# Patient Record
Sex: Female | Born: 1955 | Race: Black or African American | Hispanic: No | Smoking: Never smoker
Health system: Southern US, Community
[De-identification: ages and names within clinical notes are randomized; demographics above are authoritative.]

## PROBLEM LIST (undated history)

## (undated) DIAGNOSIS — F329 Major depressive disorder, single episode, unspecified: Secondary | ICD-10-CM

## (undated) DIAGNOSIS — I2510 Atherosclerotic heart disease of native coronary artery without angina pectoris: Secondary | ICD-10-CM

## (undated) DIAGNOSIS — K219 Gastro-esophageal reflux disease without esophagitis: Secondary | ICD-10-CM

## (undated) DIAGNOSIS — C50919 Malignant neoplasm of unspecified site of unspecified female breast: Secondary | ICD-10-CM

## (undated) DIAGNOSIS — E7800 Pure hypercholesterolemia, unspecified: Secondary | ICD-10-CM

## (undated) DIAGNOSIS — C801 Malignant (primary) neoplasm, unspecified: Secondary | ICD-10-CM

## (undated) DIAGNOSIS — R011 Cardiac murmur, unspecified: Secondary | ICD-10-CM

## (undated) DIAGNOSIS — F32A Depression, unspecified: Secondary | ICD-10-CM

## (undated) DIAGNOSIS — G4730 Sleep apnea, unspecified: Secondary | ICD-10-CM

## (undated) DIAGNOSIS — L509 Urticaria, unspecified: Secondary | ICD-10-CM

## (undated) DIAGNOSIS — E119 Type 2 diabetes mellitus without complications: Secondary | ICD-10-CM

## (undated) DIAGNOSIS — Z9289 Personal history of other medical treatment: Secondary | ICD-10-CM

## (undated) DIAGNOSIS — S36113A Laceration of liver, unspecified degree, initial encounter: Secondary | ICD-10-CM

## (undated) DIAGNOSIS — J811 Chronic pulmonary edema: Secondary | ICD-10-CM

## (undated) DIAGNOSIS — Z8739 Personal history of other diseases of the musculoskeletal system and connective tissue: Secondary | ICD-10-CM

## (undated) DIAGNOSIS — I10 Essential (primary) hypertension: Secondary | ICD-10-CM

## (undated) DIAGNOSIS — N289 Disorder of kidney and ureter, unspecified: Secondary | ICD-10-CM

## (undated) DIAGNOSIS — J189 Pneumonia, unspecified organism: Secondary | ICD-10-CM

## (undated) DIAGNOSIS — I82409 Acute embolism and thrombosis of unspecified deep veins of unspecified lower extremity: Secondary | ICD-10-CM

## (undated) DIAGNOSIS — I213 ST elevation (STEMI) myocardial infarction of unspecified site: Secondary | ICD-10-CM

## (undated) HISTORY — DX: Urticaria, unspecified: L50.9

## (undated) HISTORY — PX: BREAST SURGERY: SHX581

## (undated) HISTORY — PX: EXTRACORPOREAL CIRCULATION: SHX266

## (undated) HISTORY — PX: ABDOMINAL HYSTERECTOMY: SHX81

## (undated) HISTORY — PX: LEFT VENTRICULAR ASSIST DEVICE: SHX2679

## (undated) HISTORY — PX: LAPAROSCOPIC ABDOMINAL EXPLORATION: SHX6249

## (undated) HISTORY — PX: BREAST LUMPECTOMY WITH AXILLARY LYMPH NODE BIOPSY: SHX5593

---

## 1980-11-30 HISTORY — PX: TUBAL LIGATION: SHX77

## 2007-02-15 ENCOUNTER — Emergency Department: Payer: Self-pay | Admitting: Emergency Medicine

## 2009-02-26 ENCOUNTER — Emergency Department: Payer: Self-pay | Admitting: Emergency Medicine

## 2011-04-04 ENCOUNTER — Ambulatory Visit: Payer: Self-pay | Admitting: Internal Medicine

## 2011-09-08 ENCOUNTER — Ambulatory Visit: Payer: Self-pay | Admitting: Family Medicine

## 2011-10-01 ENCOUNTER — Ambulatory Visit: Payer: Self-pay | Admitting: Family Medicine

## 2011-10-31 ENCOUNTER — Ambulatory Visit: Payer: Self-pay | Admitting: Family Medicine

## 2011-12-01 ENCOUNTER — Ambulatory Visit: Payer: Self-pay | Admitting: Family Medicine

## 2012-03-18 ENCOUNTER — Ambulatory Visit: Payer: Self-pay | Admitting: Family Medicine

## 2012-06-10 ENCOUNTER — Ambulatory Visit: Payer: Self-pay | Admitting: Family Medicine

## 2013-04-19 DIAGNOSIS — E119 Type 2 diabetes mellitus without complications: Secondary | ICD-10-CM | POA: Insufficient documentation

## 2014-09-30 DIAGNOSIS — Z9289 Personal history of other medical treatment: Secondary | ICD-10-CM

## 2014-09-30 DIAGNOSIS — I82409 Acute embolism and thrombosis of unspecified deep veins of unspecified lower extremity: Secondary | ICD-10-CM

## 2014-09-30 HISTORY — PX: OTHER SURGICAL HISTORY: SHX169

## 2014-09-30 HISTORY — DX: Personal history of other medical treatment: Z92.89

## 2014-09-30 HISTORY — PX: CORONARY ANGIOPLASTY WITH STENT PLACEMENT: SHX49

## 2014-09-30 HISTORY — DX: Acute embolism and thrombosis of unspecified deep veins of unspecified lower extremity: I82.409

## 2014-09-30 HISTORY — PX: TRACHEOSTOMY: SUR1362

## 2014-10-19 DIAGNOSIS — J811 Chronic pulmonary edema: Secondary | ICD-10-CM | POA: Insufficient documentation

## 2014-10-19 DIAGNOSIS — I213 ST elevation (STEMI) myocardial infarction of unspecified site: Secondary | ICD-10-CM

## 2014-10-19 DIAGNOSIS — S36113A Laceration of liver, unspecified degree, initial encounter: Secondary | ICD-10-CM

## 2014-10-19 HISTORY — DX: Chronic pulmonary edema: J81.1

## 2014-10-19 HISTORY — DX: Laceration of liver, unspecified degree, initial encounter: S36.113A

## 2014-10-19 HISTORY — DX: ST elevation (STEMI) myocardial infarction of unspecified site: I21.3

## 2014-10-20 DIAGNOSIS — R570 Cardiogenic shock: Secondary | ICD-10-CM | POA: Insufficient documentation

## 2014-10-20 DIAGNOSIS — I213 ST elevation (STEMI) myocardial infarction of unspecified site: Secondary | ICD-10-CM | POA: Insufficient documentation

## 2014-10-20 DIAGNOSIS — S37009A Unspecified injury of unspecified kidney, initial encounter: Secondary | ICD-10-CM | POA: Insufficient documentation

## 2014-10-20 DIAGNOSIS — S36113A Laceration of liver, unspecified degree, initial encounter: Secondary | ICD-10-CM | POA: Insufficient documentation

## 2014-10-21 DIAGNOSIS — S75009A Unspecified injury of femoral artery, unspecified leg, initial encounter: Secondary | ICD-10-CM | POA: Insufficient documentation

## 2014-10-24 DIAGNOSIS — Z930 Tracheostomy status: Secondary | ICD-10-CM | POA: Insufficient documentation

## 2014-10-24 DIAGNOSIS — J9600 Acute respiratory failure, unspecified whether with hypoxia or hypercapnia: Secondary | ICD-10-CM | POA: Insufficient documentation

## 2014-10-27 DIAGNOSIS — F05 Delirium due to known physiological condition: Secondary | ICD-10-CM | POA: Insufficient documentation

## 2014-10-27 DIAGNOSIS — G4701 Insomnia due to medical condition: Secondary | ICD-10-CM | POA: Insufficient documentation

## 2014-10-29 DIAGNOSIS — E870 Hyperosmolality and hypernatremia: Secondary | ICD-10-CM | POA: Insufficient documentation

## 2014-10-29 DIAGNOSIS — D72829 Elevated white blood cell count, unspecified: Secondary | ICD-10-CM | POA: Insufficient documentation

## 2014-10-30 HISTORY — PX: APPLICATION OF WOUND VAC: SHX5189

## 2014-10-31 DIAGNOSIS — E119 Type 2 diabetes mellitus without complications: Secondary | ICD-10-CM | POA: Insufficient documentation

## 2014-11-04 DIAGNOSIS — I82419 Acute embolism and thrombosis of unspecified femoral vein: Secondary | ICD-10-CM | POA: Insufficient documentation

## 2014-11-22 ENCOUNTER — Emergency Department (HOSPITAL_BASED_OUTPATIENT_CLINIC_OR_DEPARTMENT_OTHER): Payer: BC Managed Care – PPO

## 2014-11-22 ENCOUNTER — Encounter (HOSPITAL_BASED_OUTPATIENT_CLINIC_OR_DEPARTMENT_OTHER): Payer: Self-pay | Admitting: *Deleted

## 2014-11-22 ENCOUNTER — Inpatient Hospital Stay (HOSPITAL_BASED_OUTPATIENT_CLINIC_OR_DEPARTMENT_OTHER)
Admission: EM | Admit: 2014-11-22 | Discharge: 2014-11-26 | DRG: 194 | Disposition: A | Discharge status: Home Health | Payer: BC Managed Care – PPO | Attending: Internal Medicine | Admitting: Internal Medicine

## 2014-11-22 DIAGNOSIS — E119 Type 2 diabetes mellitus without complications: Secondary | ICD-10-CM | POA: Diagnosis present

## 2014-11-22 DIAGNOSIS — T502X5A Adverse effect of carbonic-anhydrase inhibitors, benzothiadiazides and other diuretics, initial encounter: Secondary | ICD-10-CM | POA: Diagnosis present

## 2014-11-22 DIAGNOSIS — Z8674 Personal history of sudden cardiac arrest: Secondary | ICD-10-CM

## 2014-11-22 DIAGNOSIS — Z7982 Long term (current) use of aspirin: Secondary | ICD-10-CM

## 2014-11-22 DIAGNOSIS — J029 Acute pharyngitis, unspecified: Secondary | ICD-10-CM | POA: Diagnosis present

## 2014-11-22 DIAGNOSIS — E871 Hypo-osmolality and hyponatremia: Secondary | ICD-10-CM | POA: Diagnosis present

## 2014-11-22 DIAGNOSIS — I252 Old myocardial infarction: Secondary | ICD-10-CM

## 2014-11-22 DIAGNOSIS — Z9861 Coronary angioplasty status: Secondary | ICD-10-CM

## 2014-11-22 DIAGNOSIS — E876 Hypokalemia: Secondary | ICD-10-CM

## 2014-11-22 DIAGNOSIS — R011 Cardiac murmur, unspecified: Secondary | ICD-10-CM | POA: Diagnosis present

## 2014-11-22 DIAGNOSIS — I82402 Acute embolism and thrombosis of unspecified deep veins of left lower extremity: Secondary | ICD-10-CM

## 2014-11-22 DIAGNOSIS — R0602 Shortness of breath: Secondary | ICD-10-CM

## 2014-11-22 DIAGNOSIS — I429 Cardiomyopathy, unspecified: Secondary | ICD-10-CM | POA: Diagnosis present

## 2014-11-22 DIAGNOSIS — Y95 Nosocomial condition: Secondary | ICD-10-CM | POA: Diagnosis present

## 2014-11-22 DIAGNOSIS — J189 Pneumonia, unspecified organism: Secondary | ICD-10-CM | POA: Insufficient documentation

## 2014-11-22 DIAGNOSIS — I10 Essential (primary) hypertension: Secondary | ICD-10-CM | POA: Diagnosis present

## 2014-11-22 DIAGNOSIS — J069 Acute upper respiratory infection, unspecified: Secondary | ICD-10-CM | POA: Diagnosis present

## 2014-11-22 DIAGNOSIS — Z79899 Other long term (current) drug therapy: Secondary | ICD-10-CM

## 2014-11-22 DIAGNOSIS — R0600 Dyspnea, unspecified: Secondary | ICD-10-CM | POA: Diagnosis present

## 2014-11-22 DIAGNOSIS — Z86718 Personal history of other venous thrombosis and embolism: Secondary | ICD-10-CM

## 2014-11-22 DIAGNOSIS — I82409 Acute embolism and thrombosis of unspecified deep veins of unspecified lower extremity: Secondary | ICD-10-CM | POA: Diagnosis present

## 2014-11-22 DIAGNOSIS — I2510 Atherosclerotic heart disease of native coronary artery without angina pectoris: Secondary | ICD-10-CM | POA: Diagnosis present

## 2014-11-22 DIAGNOSIS — R002 Palpitations: Secondary | ICD-10-CM

## 2014-11-22 DIAGNOSIS — Z7901 Long term (current) use of anticoagulants: Secondary | ICD-10-CM

## 2014-11-22 HISTORY — DX: Pneumonia, unspecified organism: J18.9

## 2014-11-22 HISTORY — DX: Personal history of other diseases of the musculoskeletal system and connective tissue: Z87.39

## 2014-11-22 HISTORY — DX: Disorder of kidney and ureter, unspecified: N28.9

## 2014-11-22 HISTORY — DX: Sleep apnea, unspecified: G47.30

## 2014-11-22 HISTORY — DX: Atherosclerotic heart disease of native coronary artery without angina pectoris: I25.10

## 2014-11-22 HISTORY — DX: Cardiac murmur, unspecified: R01.1

## 2014-11-22 HISTORY — DX: Pure hypercholesterolemia, unspecified: E78.00

## 2014-11-22 HISTORY — DX: ST elevation (STEMI) myocardial infarction of unspecified site: I21.3

## 2014-11-22 HISTORY — DX: Personal history of other medical treatment: Z92.89

## 2014-11-22 HISTORY — DX: Acute embolism and thrombosis of unspecified deep veins of unspecified lower extremity: I82.409

## 2014-11-22 HISTORY — DX: Essential (primary) hypertension: I10

## 2014-11-22 HISTORY — DX: Laceration of liver, unspecified degree, initial encounter: S36.113A

## 2014-11-22 HISTORY — DX: Chronic pulmonary edema: J81.1

## 2014-11-22 HISTORY — DX: Type 2 diabetes mellitus without complications: E11.9

## 2014-11-22 HISTORY — DX: Gastro-esophageal reflux disease without esophagitis: K21.9

## 2014-11-22 LAB — CBC WITH DIFFERENTIAL/PLATELET
Basophils Absolute: 0 10*3/uL (ref 0.0–0.1)
Basophils Relative: 0 % (ref 0–1)
EOS ABS: 0.1 10*3/uL (ref 0.0–0.7)
Eosinophils Relative: 2 % (ref 0–5)
HEMATOCRIT: 36 % (ref 36.0–46.0)
Hemoglobin: 12.1 g/dL (ref 12.0–15.0)
LYMPHS ABS: 1.5 10*3/uL (ref 0.7–4.0)
Lymphocytes Relative: 29 % (ref 12–46)
MCH: 30.3 pg (ref 26.0–34.0)
MCHC: 33.6 g/dL (ref 30.0–36.0)
MCV: 90.2 fL (ref 78.0–100.0)
MONOS PCT: 20 % — AB (ref 3–12)
Monocytes Absolute: 1.1 10*3/uL — ABNORMAL HIGH (ref 0.1–1.0)
Neutro Abs: 2.6 10*3/uL (ref 1.7–7.7)
Neutrophils Relative %: 49 % (ref 43–77)
Platelets: 342 10*3/uL (ref 150–400)
RBC: 3.99 MIL/uL (ref 3.87–5.11)
RDW: 14.8 % (ref 11.5–15.5)
WBC: 5.3 10*3/uL (ref 4.0–10.5)

## 2014-11-22 LAB — COMPREHENSIVE METABOLIC PANEL
ALK PHOS: 123 U/L — AB (ref 39–117)
ALT: 29 U/L (ref 0–35)
AST: 42 U/L — ABNORMAL HIGH (ref 0–37)
Albumin: 3.8 g/dL (ref 3.5–5.2)
Anion gap: 15 (ref 5–15)
BUN: 8 mg/dL (ref 6–23)
CO2: 24 mmol/L (ref 19–32)
Calcium: 10 mg/dL (ref 8.4–10.5)
Chloride: 94 mEq/L — ABNORMAL LOW (ref 96–112)
Creatinine, Ser: 0.55 mg/dL (ref 0.50–1.10)
GFR calc non Af Amer: >90 mL/min (ref >90)
Glucose, Bld: 112 mg/dL — ABNORMAL HIGH (ref 70–99)
POTASSIUM: 3.2 mmol/L — AB (ref 3.5–5.1)
Sodium: 133 mmol/L — ABNORMAL LOW (ref 135–145)
TOTAL PROTEIN: 8.4 g/dL — AB (ref 6.0–8.3)
Total Bilirubin: 1.5 mg/dL — ABNORMAL HIGH (ref 0.3–1.2)

## 2014-11-22 LAB — I-STAT CG4 LACTIC ACID, ED: Lactic Acid, Venous: 2.19 mmol/L (ref 0.5–2.2)

## 2014-11-22 LAB — GLUCOSE, CAPILLARY: GLUCOSE-CAPILLARY: 106 mg/dL — AB (ref 70–99)

## 2014-11-22 LAB — PROTIME-INR
INR: 1.65 — ABNORMAL HIGH (ref 0.00–1.49)
Prothrombin Time: 19.5 seconds — ABNORMAL HIGH (ref 11.6–15.2)

## 2014-11-22 LAB — TROPONIN I: Troponin I: 0.03 ng/mL (ref <0.031)

## 2014-11-22 MED ORDER — IOHEXOL 350 MG/ML SOLN
100.0000 mL | Freq: Once | INTRAVENOUS | Status: AC | PRN
  Administered 2014-11-22: 100 mL via INTRAVENOUS

## 2014-11-22 MED ORDER — ALBUTEROL SULFATE (2.5 MG/3ML) 0.083% IN NEBU: 2.5000 mg | INHALATION_SOLUTION | RESPIRATORY_TRACT | Status: DC | PRN

## 2014-11-22 MED ORDER — ENSURE COMPLETE PO LIQD: 237.0000 mL | Freq: Two times a day (BID) | ORAL | Status: DC

## 2014-11-22 MED ORDER — VANCOMYCIN HCL IN DEXTROSE 1-5 GM/200ML-% IV SOLN
1000.0000 mg | Freq: Two times a day (BID) | INTRAVENOUS | Status: DC
  Administered 2014-11-22 – 2014-11-26 (×9): 1000 mg via INTRAVENOUS
  Filled 2014-11-22 (×11): qty 200

## 2014-11-22 MED ORDER — TICAGRELOR 90 MG PO TABS
90.0000 mg | ORAL_TABLET | Freq: Two times a day (BID) | ORAL | Status: DC
  Administered 2014-11-22 – 2014-11-26 (×8): 90 mg via ORAL
  Filled 2014-11-22 (×9): qty 1

## 2014-11-22 MED ORDER — SACCHAROMYCES BOULARDII 250 MG PO CAPS
250.0000 mg | ORAL_CAPSULE | Freq: Two times a day (BID) | ORAL | Status: DC
  Administered 2014-11-22 – 2014-11-26 (×8): 250 mg via ORAL
  Filled 2014-11-22 (×9): qty 1

## 2014-11-22 MED ORDER — ZOLPIDEM TARTRATE 5 MG PO TABS
5.0000 mg | ORAL_TABLET | Freq: Once | ORAL | Status: DC
  Filled 2014-11-22: qty 1

## 2014-11-22 MED ORDER — ATORVASTATIN CALCIUM 80 MG PO TABS
80.0000 mg | ORAL_TABLET | Freq: Every day | ORAL | Status: DC
  Administered 2014-11-22 – 2014-11-25 (×4): 80 mg via ORAL
  Filled 2014-11-22 (×5): qty 1

## 2014-11-22 MED ORDER — SODIUM CHLORIDE 0.9 % IJ SOLN
3.0000 mL | Freq: Two times a day (BID) | INTRAMUSCULAR | Status: DC
  Administered 2014-11-22 – 2014-11-26 (×3): 3 mL via INTRAVENOUS

## 2014-11-22 MED ORDER — WARFARIN SODIUM 2.5 MG PO TABS
2.5000 mg | ORAL_TABLET | Freq: Once | ORAL | Status: AC
  Administered 2014-11-22: 2.5 mg via ORAL
  Filled 2014-11-22: qty 1

## 2014-11-22 MED ORDER — ASPIRIN EC 81 MG PO TBEC
81.0000 mg | DELAYED_RELEASE_TABLET | Freq: Every day | ORAL | Status: DC
  Administered 2014-11-22 – 2014-11-26 (×5): 81 mg via ORAL
  Filled 2014-11-22 (×5): qty 1

## 2014-11-22 MED ORDER — MAGNESIUM OXIDE 400 MG PO TABS
1200.0000 mg | ORAL_TABLET | Freq: Three times a day (TID) | ORAL | Status: DC
  Administered 2014-11-22 – 2014-11-25 (×11): 1200 mg via ORAL
  Filled 2014-11-22 (×14): qty 3

## 2014-11-22 MED ORDER — WARFARIN - PHARMACIST DOSING INPATIENT
Freq: Every day | Status: DC
  Administered 2014-11-22 – 2014-11-26 (×5)

## 2014-11-22 MED ORDER — ACETAMINOPHEN 500 MG PO TABS
500.0000 mg | ORAL_TABLET | Freq: Two times a day (BID) | ORAL | Status: DC | PRN
Start: 2014-11-22 — End: 2014-11-26
  Administered 2014-11-22 – 2014-11-25 (×4): 500 mg via ORAL
  Filled 2014-11-22 (×4): qty 1

## 2014-11-22 MED ORDER — INSULIN ASPART 100 UNIT/ML ~~LOC~~ SOLN: 0.0000 [IU] | Freq: Three times a day (TID) | SUBCUTANEOUS | Status: DC

## 2014-11-22 MED ORDER — FUROSEMIDE 40 MG PO TABS
40.0000 mg | ORAL_TABLET | Freq: Two times a day (BID) | ORAL | Status: DC
  Administered 2014-11-22 – 2014-11-24 (×4): 40 mg via ORAL
  Filled 2014-11-22 (×6): qty 1

## 2014-11-22 MED ORDER — PIPERACILLIN-TAZOBACTAM 3.375 G IVPB
3.3750 g | Freq: Three times a day (TID) | INTRAVENOUS | Status: DC
  Administered 2014-11-22 – 2014-11-26 (×12): 3.375 g via INTRAVENOUS
  Filled 2014-11-22 (×15): qty 50

## 2014-11-22 MED ORDER — PIPERACILLIN-TAZOBACTAM 3.375 G IVPB
3.3750 g | Freq: Once | INTRAVENOUS | Status: DC
  Filled 2014-11-22: qty 50

## 2014-11-22 MED ORDER — POTASSIUM CHLORIDE CRYS ER 20 MEQ PO TBCR
40.0000 meq | EXTENDED_RELEASE_TABLET | Freq: Two times a day (BID) | ORAL | Status: DC
  Administered 2014-11-22 – 2014-11-26 (×8): 40 meq via ORAL
  Filled 2014-11-22 (×11): qty 2

## 2014-11-22 MED ORDER — CULTURELLE DIGESTIVE HEALTH PO CAPS: 1.0000 | ORAL_CAPSULE | Freq: Every day | ORAL | Status: DC

## 2014-11-22 MED ORDER — PIPERACILLIN-TAZOBACTAM 3.375 G IVPB 30 MIN
3.3750 g | Freq: Once | INTRAVENOUS | Status: AC
  Administered 2014-11-22: 3.375 g via INTRAVENOUS
  Filled 2014-11-22: qty 50

## 2014-11-22 MED ORDER — METOPROLOL TARTRATE 12.5 MG HALF TABLET
12.5000 mg | ORAL_TABLET | Freq: Two times a day (BID) | ORAL | Status: DC
  Administered 2014-11-22 – 2014-11-26 (×8): 12.5 mg via ORAL
  Filled 2014-11-22 (×10): qty 1

## 2014-11-22 NOTE — Progress Notes (Signed)
ANTICOAGULATION CONSULT NOTE - Initial Consult  Pharmacy Consult forCoumadin Indication: VTE treatment (recently diagnosed 09/2014)  Allergies  Allergen Reactions  . Epinephrine Other (See Comments)    Severe anxiety  . Lactose Intolerance (Gi) Diarrhea    Patient Measurements: Height: 5' 2.5" (158.8 cm) Weight: 171 lb (77.565 kg) IBW/kg (Calculated) : 51.25   Vital Signs: Temp: 97.8 F (36.6 C) (12/24 2002) Temp Source: Oral (12/24 2002) BP: 96/61 mmHg (12/24 2002) Pulse Rate: 85 (12/24 2002)  Labs:  Recent Labs  11/22/14 1031  HGB 12.1  HCT 36.0  PLT 342  LABPROT 19.5*  INR 1.65*  CREATININE 0.55  TROPONINI 0.03    Estimated Creatinine Clearance: 76.6 mL/min (by C-G formula based on Cr of 0.55).   Medical History: Past Medical History  Diagnosis Date  . Diabetes mellitus without complication   . Renal disorder   . Coronary artery disease   . STEMI (ST elevation myocardial infarction) 10/19/14  . MI (myocardial infarction) 10/19/14  . Liver laceration 10/19/14  . Pulmonary edema 10/19/14  . DVT (deep venous thrombosis) 11/15    left femerol artery injury during resusciation.    Medications:  Prescriptions prior to admission  Medication Sig Dispense Refill Last Dose  . acetaminophen (TYLENOL) 500 MG tablet Take 500 mg by mouth 2 (two) times daily as needed (pain).   11/21/2014 at Unknown time  . aspirin EC 81 MG tablet Take 81 mg by mouth daily.   11/21/2014 at Unknown time  . atorvastatin (LIPITOR) 80 MG tablet Take 80 mg by mouth at bedtime.    11/21/2014 at Unknown time  . clindamycin (CLEOCIN) 300 MG capsule Take 300 mg by mouth every 6 (six) hours. 10 day course started 11/16/14   11/22/2014 at 630  . furosemide (LASIX) 40 MG tablet Take 40 mg by mouth 2 (two) times daily.   11/21/2014 at Unknown time  . Lactobacillus-Inulin (Gorst) CAPS Take 1 capsule by mouth daily.   11/21/2014 at Unknown time  . magnesium oxide (MAG-OX)  400 MG tablet Take 1,200 mg by mouth 3 (three) times daily.    11/21/2014 at Unknown time  . metoprolol tartrate (LOPRESSOR) 25 MG tablet Take 12.5 mg by mouth every 12 (twelve) hours.    11/21/2014 at 2100  . potassium chloride SA (K-DUR,KLOR-CON) 20 MEQ tablet Take 40 mEq by mouth daily.   11/21/2014 at Unknown time  . ticagrelor (BRILINTA) 90 MG TABS tablet Take 90 mg by mouth every 12 (twelve) hours.    11/21/2014 at 2100  . warfarin (COUMADIN) 1 MG tablet Take 1.5 mg by mouth at bedtime.    11/21/2014 at Unknown time  . metFORMIN (GLUCOPHAGE) 500 MG tablet Take 500 mg by mouth daily with breakfast.    11/21/2014   Scheduled:  . aspirin EC  81 mg Oral Daily  . atorvastatin  80 mg Oral QHS  . furosemide  40 mg Oral BID  . [START ON 11/23/2014] insulin aspart  0-9 Units Subcutaneous TID WC  . magnesium oxide  1,200 mg Oral TID  . metoprolol tartrate  12.5 mg Oral Q12H  . piperacillin-tazobactam (ZOSYN)  IV  3.375 g Intravenous 3 times per day  . potassium chloride  40 mEq Oral BID  . saccharomyces boulardii  250 mg Oral BID  . sodium chloride  3 mL Intravenous Q12H  . ticagrelor  90 mg Oral Q12H  . vancomycin  1,000 mg Intravenous Q12H  . Warfarin - Pharmacist Dosing Inpatient  Does not apply q1800  . zolpidem  5 mg Oral Once    Assessment: 58 y.o female on coumadin PTA for recent diagnosis of DVT 09/2014.  PMH of DM, HTN, recently admitted to [facility] with STEMI, s/p cardiac arrest CPR complicated with liver laceration/repair; s/p PCI multiple stents on brilinta, new DVT (09/2014). She presented today 11/22/14 with SOB, non productive cough, associated with palpitations, and URI. Admitted for early PNA; HCAP. She was on coumadin 1.5 mg daily prior to admission with dose reportedly last taken on 11/21/14.  Admit INR = 1.65 which is subtheratpeutic.. No hemoptysis, melena, hematochezia,  hematuria, or abnormal bleeding reported.    Goal of Therapy:  INR 2-3 Monitor platelets by  anticoagulation protocol: Yes   Plan:  Coumadin 2.5 mg po tonight x1 PT/INR qAM  Nicole Cella, RPh Clinical Pharmacist Pager: [phone] 11/22/2014,8:53 PM

## 2014-11-22 NOTE — ED Notes (Signed)
Patient states she woke up this morning with mild heart palpitations.  Palpitations have progressively gotten worse and are associated with sob. Patient is s/p MI on 10/19/14 and was seen at [facility].  Multiple complications during resuscitation  efforts, to include liver lac repair, and insertion of bilateral fem-pop stents.  Patient was hospitalized from 10/19/14 until 11/15/14.  Patient also has had diarrhea two to three times per day since she was in the hospital today.

## 2014-11-22 NOTE — ED Provider Notes (Signed)
CSN: [id number]     Arrival date & time 11/22/14  3810 History   First MD Initiated Contact with Patient 11/22/14 1016     Chief Complaint  Patient presents with  . Palpitations     (Consider location/radiation/quality/duration/timing/severity/associated sxs/prior Treatment) HPI Comments: Patient has a significant history of on November 20 she had a STEMI for which she was taken to [facility] emergency room and on arrival there she went into cardiac arrest. CPR was initiated and patient had ROSC and received multiple stents. However given the aggressive CPR the patient received during she developed a liver laceration and in the haste to place stents for her MI she also had a complication at her catheterization site in her right groin.  She was in hypothermic, but had to be taken quickly to surgery for repair of the liver laceration in her right groin site. She required hospitalization for 30 days and was only discharged 5 days ago when she went home with her son. Also during hospitalization she was found to have a DVT and was started on Coumadin. Prior to discharge patient was able to walk up 6 flights of stairs and ambulate without shortness of breath or chest pain. Since being with her son patient has been reasonably well until 2 days ago after eating [place] food she developed indigestion which was not improved with Pepto-Bismol and then took Mylanta yesterday with only minimal improvement. She describes it as a burning in her throat that is worse when she attempts to swallow or eat. The shortness of breath started at 6 AM this morning. It was present at rest but much worse when she attempted to do anything. She also noticed the palpitations only when she got up and attempted to walk. She denied cough, fever, lower extremity swelling, weight gain abdominal pain. She does have a wound VAC on her abdomen and still has staples present but states that it seems to be getting better.  Patient is a 58 y.o. female  presenting with palpitations. The history is provided by the patient and a caregiver.  Palpitations Palpitations quality:  Regular Onset quality:  Gradual Duration:  4 hours Timing:  Constant Progression:  Worsening Chronicity:  New Context comment:  Patient noticed her heart palpitating anytime she attempted to get up to do anything Relieved by:  Bed rest Exacerbated by: Exertion. Ineffective treatments:  None tried Associated symptoms: shortness of breath   Associated symptoms: no chest pain, no chest pressure, no cough, no hemoptysis, no leg pain, no nausea, no syncope, no vomiting and no weakness   Risk factors: diabetes mellitus, heart disease, hx of DVT and hypercoagulable state   Risk factors: no hx of atrial fibrillation     Past Medical History  Diagnosis Date  . Diabetes mellitus without complication   . Renal disorder   . Coronary artery disease   . STEMI (ST elevation myocardial infarction) 10/19/14  . MI (myocardial infarction) 10/19/14  . Liver laceration 10/19/14  . Pulmonary edema 10/19/14  . DVT (deep venous thrombosis) 11/15    left femerol artery injury during resusciation.   Past Surgical History  Procedure Laterality Date  . Abdominal hysterectomy    . Cesarean section    . Laparoscopic abdominal exploration    . Liver laceration repair    . Tracheostomy    . Femerol artery repair Left   . Extracorporeal circulation    . Left ventricular assist device     No family history on file. History  Substance Use Topics  . Smoking status: Never Smoker   . Smokeless tobacco: Not on file  . Alcohol Use: No   OB History    Gravida Para Term Preterm AB TAB SAB Ectopic Multiple Living   2 2             Review of Systems  Constitutional: Negative for fever.  Respiratory: Positive for shortness of breath. Negative for cough, hemoptysis and chest tightness.   Cardiovascular: Positive for palpitations. Negative for chest pain, leg swelling and syncope.   Gastrointestinal: Negative for nausea, vomiting and abdominal pain.  All other systems reviewed and are negative.     Allergies  Epinephrine and Lactose intolerance (gi)  Home Medications   Prior to Admission medications   Medication Sig Start Date End Date Taking? Authorizing Provider  aspirin 81 MG tablet Take 81 mg by mouth daily.   Yes Historical Provider, MD  atorvastatin (LIPITOR) 80 MG tablet Take 80 mg by mouth daily.   Yes Historical Provider, MD  clindamycin (CLEOCIN) 300 MG capsule Take 300 mg by mouth 4 (four) times daily.   Yes Historical Provider, MD  furosemide (LASIX) 40 MG tablet Take 40 mg by mouth 2 (two) times daily.   Yes Historical Provider, MD  magnesium oxide (MAG-OX) 400 MG tablet Take 400 mg by mouth 3 (three) times daily.   Yes Historical Provider, MD  metFORMIN (GLUCOPHAGE) 500 MG tablet Take by mouth daily with breakfast.   Yes Historical Provider, MD  metoprolol tartrate (LOPRESSOR) 25 MG tablet Take 12.5 mg by mouth 2 (two) times daily.   Yes Historical Provider, MD  potassium chloride SA (K-DUR,KLOR-CON) 20 MEQ tablet Take 40 mEq by mouth daily.   Yes Historical Provider, MD  ticagrelor (BRILINTA) 90 MG TABS tablet Take 90 mg by mouth 2 (two) times daily.   Yes Historical Provider, MD  warfarin (COUMADIN) 1 MG tablet Take 1 mg by mouth daily.   Yes Historical Provider, MD   BP 116/72 mmHg  Pulse 95  Resp 21  Ht 5' 2.5" (1.588 m)  Wt 171 lb (77.565 kg)  BMI 30.76 kg/m2  SpO2 100% Physical Exam  Constitutional: She is oriented to person, place, and time. She appears well-developed and well-nourished. No distress.  HENT:  Head: Normocephalic and atraumatic.  Mouth/Throat: Oropharynx is clear and moist.  Eyes: Conjunctivae and EOM are normal. Pupils are equal, round, and reactive to light.  Neck: Normal range of motion. Neck supple.  Cardiovascular: Regular rhythm and intact distal pulses.  Tachycardia present.   Murmur heard. Pulmonary/Chest:  Effort normal and breath sounds normal. Tachypnea noted. No respiratory distress. She has no wheezes. She has no rales.  Abdominal: Soft. She exhibits no distension. There is no tenderness. There is no rebound and no guarding.  Midline abdominal incision with staples present and wound VAC at the distal end of the incision intact. Appears to be healing well. 2 incisions present under bilateral breasts with only minimal drainage and no signs of infection. No notable abdominal pain  Musculoskeletal: Normal range of motion. She exhibits no edema or tenderness.  Neurological: She is alert and oriented to person, place, and time.  Skin: Skin is warm and dry. No rash noted. No erythema.  Psychiatric: She has a normal mood and affect. Her behavior is normal.  Nursing note and vitals reviewed.   ED Course  Procedures (including critical care time) Labs Review Labs Reviewed  CBC WITH DIFFERENTIAL - Abnormal; Notable for the following:  Monocytes Relative 20 (*)    Monocytes Absolute 1.1 (*)    All other components within normal limits  COMPREHENSIVE METABOLIC PANEL - Abnormal; Notable for the following:    Sodium 133 (*)    Potassium 3.2 (*)    Chloride 94 (*)    Glucose, Bld 112 (*)    Total Protein 8.4 (*)    AST 42 (*)    Alkaline Phosphatase 123 (*)    Total Bilirubin 1.5 (*)    All other components within normal limits  PROTIME-INR - Abnormal; Notable for the following:    Prothrombin Time 19.5 (*)    INR 1.65 (*)    All other components within normal limits  TROPONIN I  I-STAT CG4 LACTIC ACID, ED    Imaging Review Dg Chest 2 View  11/22/2014   CLINICAL DATA:  Chest pain and mouth heart stations. Recent long hospitalization for myocardial infarction  EXAM: CHEST  2 VIEW  COMPARISON:  None.  FINDINGS: Normal mediastinum and cardiac silhouette. Normal pulmonary vasculature. No evidence of effusion, infiltrate, or pneumothorax. No acute bony abnormality.  IMPRESSION: No acute  cardiopulmonary process.   Electronically Signed   By: Suzy Bouchard M.D.   On: 11/22/2014 10:53   Ct Angio Chest Pe W/cm &/or Wo Cm  11/22/2014   CLINICAL DATA:  Chest pain and shortness of breath  EXAM: CT ANGIOGRAPHY CHEST WITH CONTRAST  TECHNIQUE: Multidetector CT imaging of the chest was performed using the standard protocol during bolus administration of intravenous contrast. Multiplanar CT image reconstructions and MIPs were obtained to evaluate the vascular anatomy.  CONTRAST:  122mL OMNIPAQUE IOHEXOL 350 MG/ML SOLN  COMPARISON:  Chest radiograph November 22, 2014  FINDINGS: There is no demonstrable pulmonary embolus. There is no demonstrable thoracic aortic aneurysm or dissection.  There is patchy atelectatic change in both lower lobes  As well as in the right middle lobe. There is a semi-solid opacity in the superior segment of the right lower lobe measuring 1.1 x 1.1 cm, best seen on axial slice 38 series 6. On axial slice 42 series 6, there is a nodular opacity abutting the pleura in the superior segment of the right lower lobe measuring 3 mm.  There is extensive coronary artery calcification. Pericardium is not appreciably thickened. There is left ventricular hypertrophy.  There is no appreciable thoracic adenopathy.  In the visualized upper abdomen, there is hepatic steatosis. There is slight hypertrophy of the visualize left adrenal. There is incomplete visualization of an area of decreased attenuation near the fissure for the ligamentum teres measuring 2.5 x 1.9 cm.  There are no blastic or lytic bone lesions. There is no appreciable thyroid lesion.  Review of the MIP images confirms the above findings.  IMPRESSION: No demonstrable pulmonary embolus.  Patchy atelectasis in both lower lobes and right middle lobe.  Semi-solid opacity in the superior segment of the right lower lobe measuring 1.1 x 1.1 cm. This finding may represent a small focus of pneumonia. Atypical neoplasm can present in this  manner, however, an in this regard, a followup CT in 10-12 weeks is advised to further assess. A 3 mm nodular opacity in the right lower lobe superior segment is also noted.  No demonstrable adenopathy.  Extensive coronary artery calcification. Left ventricular hypertrophy.  Hepatic steatosis. Incomplete visualization of an apparent mass near the fissure for the ligamentum teres. While this area potentially could represent focal fatty infiltration, further evaluation of this area appears warranted. Nonemergent pre and  postcontrast MR of the liver to further assess would be the optimum study of choice further assessment. If there is a contraindication to MRI, nonemergent pre and multiphasic post-contrast CT would be the alternative imaging study to further evaluate this area.   Electronically Signed   By: Lowella Grip M.D.   On: 11/22/2014 12:01     EKG Interpretation   Date/Time:  Thursday November 22 2014 10:04:41 EST Ventricular Rate:  133 PR Interval:  146 QRS Duration: 64 QT Interval:  282 QTC Calculation: 419 R Axis:   37 Text Interpretation:  Sinus tachycardia Possible Inferior infarct , age  undetermined Cannot rule out Anterior infarct , age undetermined No  previous tracing Confirmed by Maryan Rued  MD, Marceline Napierala ([id number]) on 11/22/2014  10:06:47 AM      MDM   Final diagnoses:  SOB (shortness of breath)  Palpitation   Patient with extensive recent medical history now here complaining of palpitations and shortness of breath. Upon arrival here patient had a heart rate of 130 which improved with oxygen and rest between 90 and 100.  Patient denies any chest pain at this time she has no abdominal pain and abdominal wounds appear to be healing well. She has no lower extremity edema on exam however she does have a history of a DVT from recent hospitalization. She is taking Coumadin daily and prior to leaving the hospital 5 days ago her INR was 3.5. Patient denies any prior history of asthma  or lung disease. During her hospitalization she did have multiple stents placed in the heart and has not had any chest pain since. She is taking all of her medications as prescribed. She does have a murmur on exam and unclear if that is new.  Concern for possible PE versus early infection versus fluid versus cardiac cause of her symptoms. No sign of dysrhythmia on EKG an EKG otherwise without any signs of ST elevation or depression.  Chest x-ray is clear. Labs show normal troponin, stable hemoglobin of 12 and in normal renal function. Minimal hyponatremia and hypokalemia which is most likely medication related.  INR is subtherapeutic at 1.65. Given known DVT, subtherapeutic INR will do a CT of the chest to rule out PE.  12:23 PM CT shows area of early PNA and with pt's tachycardia, SOB and recent prolonged hospitalization will treat for HCAP and admit for further care.  She is comfortable to breathing well on 2L [place]   Blanchie Dessert, MD 11/22/14 1224

## 2014-11-22 NOTE — Progress Notes (Addendum)
ANTIBIOTIC CONSULT NOTE - INITIAL  Pharmacy Consult for vancomycin Indication: rule out pneumonia  Allergies  Allergen Reactions  . Epinephrine Other (See Comments)    Severe anxiety  . Lactose Intolerance (Gi) Diarrhea    Patient Measurements: Height: 5' 2.5" (158.8 cm) Weight: 171 lb (77.565 kg) IBW/kg (Calculated) : 51.25 Adjusted Body Weight:   Vital Signs: BP: 116/72 mmHg (12/24 1047) Pulse Rate: 95 (12/24 1047) Intake/Output from previous day:   Intake/Output from this shift:    Labs:  Recent Labs  11/22/14 1031  WBC 5.3  HGB 12.1  PLT 342  CREATININE 0.55   Estimated Creatinine Clearance: 76.6 mL/min (by C-G formula based on Cr of 0.55). No results for input(s): VANCOTROUGH, VANCOPEAK, VANCORANDOM, GENTTROUGH, GENTPEAK, GENTRANDOM, TOBRATROUGH, TOBRAPEAK, TOBRARND, AMIKACINPEAK, AMIKACINTROU, AMIKACIN in the last 72 hours.   Microbiology: No results found for this or any previous visit (from the past 720 hour(s)).  Medical History: Past Medical History  Diagnosis Date  . Diabetes mellitus without complication   . Renal disorder   . Coronary artery disease   . STEMI (ST elevation myocardial infarction) 10/19/14  . MI (myocardial infarction) 10/19/14  . Liver laceration 10/19/14  . Pulmonary edema 10/19/14  . DVT (deep venous thrombosis) 11/15    left femerol artery injury during resusciation.    Medications:  Anti-infectives    Start     Dose/Rate Route Frequency Ordered Stop   11/22/14 1300  vancomycin (VANCOCIN) IVPB 1000 mg/200 mL premix     1,000 mg200 mL/hr over 60 Minutes Intravenous Every 12 hours 11/22/14 1231     11/22/14 1230  piperacillin-tazobactam (ZOSYN) IVPB 3.375 g     3.375 g12.5 mL/hr over 240 Minutes Intravenous  Once 11/22/14 1222       Assessment: 73 yof presented to the ED with palpitations. To start empiric vancomycin for treatment of pneumonia. MD also ordered a 1x dose of zosyn. No temperature documented but WBC is WNL  and SCr is WNL at 0.55.   Vanc 12/24>> Zosyn x 1 12/24>>  Goal of Therapy:  Vancomycin trough level 15-20 mcg/ml  Plan:  1. Vancomycin 1gm IV Q12H 2. F/u renal fxn, C&S, clinical status and trough at SS 3. F/u continuation of zosyn or other gram-negative coverage  Rumbarger, Rande Lawman 11/22/2014,12:31 PM  Addendum:  Pharmacy consulted to continue Zosyn per protocol for HCAP.  Patient received zosyn 3.375 gm x1 in ED at 12:57. SCr = 0.55 and CrCl ~ 77 ml/min,   Plan: Zosyn 3.375 gm IV q8h (4 hour infusion)  Nicole Cella, RPh Clinical Pharmacist Pager: [phone] 11/22/2014, 6:00 PM

## 2014-11-22 NOTE — H&P (Signed)
[facility] History and Physical  Millissa Deese ZMO:[id number] DOB: Jan 07, 1956 DOA: 11/22/2014  Referring physician:  PCP: No primary care provider on file.  Specialists:   Chief Complaint: SOB, cough,palpitations   HPI: Marie Tanner is a 58 y.o. female with PMH of DM, HTN, recently admitted to [facility] with STEMI, s/p cardiac arrest CPR complicated with liver laceration/repair; s/p PCI multiple stents on brilinta, new DVT on coumadin presented with SOB, non productive cough, associated with palpitations, and URI; patient also reports SOB, DOE, but no significant chest pains; no nausea, vomiting, has some mild diarrhea  - initial CXR: unremarkable, but f/u CT showed early pneumonia;   Review of Systems: The patient denies anorexia, fever, weight loss,, vision loss, decreased hearing, hoarseness, chest pain, syncope, dyspnea on exertion, peripheral edema, balance deficits, hemoptysis, abdominal pain, melena, hematochezia, severe indigestion/heartburn, hematuria, incontinence, genital sores, muscle weakness, suspicious skin lesions, transient blindness, difficulty walking, depression, unusual weight change, abnormal bleeding, enlarged lymph nodes, angioedema, and breast masses.    Past Medical History  Diagnosis Date  . Diabetes mellitus without complication   . Renal disorder   . Coronary artery disease   . STEMI (ST elevation myocardial infarction) 10/19/14  . MI (myocardial infarction) 10/19/14  . Liver laceration 10/19/14  . Pulmonary edema 10/19/14  . DVT (deep venous thrombosis) 11/15    left femerol artery injury during resusciation.   Past Surgical History  Procedure Laterality Date  . Abdominal hysterectomy    . Cesarean section    . Laparoscopic abdominal exploration    . Liver laceration repair    . Tracheostomy    . Femerol artery repair Left   . Extracorporeal circulation    . Left ventricular assist device     Social History:  reports that she has  never smoked. She does not have any smokeless tobacco history on file. She reports that she does not drink alcohol or use illicit drugs. Home;  where does patient live--home, ALF, SNF? and with whom if at home? Yes;  Can patient participate in ADLs?  Allergies  Allergen Reactions  . Epinephrine Other (See Comments)    Severe anxiety  . Lactose Intolerance (Gi) Diarrhea    No family history on file.  (be sure to complete)  Prior to Admission medications   Medication Sig Start Date End Date Taking? Authorizing Provider  acetaminophen (TYLENOL) 500 MG tablet Take 500 mg by mouth 2 (two) times daily as needed (pain).   Yes Historical Provider, MD  aspirin EC 81 MG tablet Take 81 mg by mouth daily.   Yes Historical Provider, MD  atorvastatin (LIPITOR) 80 MG tablet Take 80 mg by mouth at bedtime.    Yes Historical Provider, MD  clindamycin (CLEOCIN) 300 MG capsule Take 300 mg by mouth every 6 (six) hours. 10 day course started 11/16/14   Yes Historical Provider, MD  furosemide (LASIX) 40 MG tablet Take 40 mg by mouth 2 (two) times daily.   Yes Historical Provider, MD  Lactobacillus-Inulin (Sour John) CAPS Take 1 capsule by mouth daily.   Yes Historical Provider, MD  magnesium oxide (MAG-OX) 400 MG tablet Take 1,200 mg by mouth 3 (three) times daily.    Yes Historical Provider, MD  metoprolol tartrate (LOPRESSOR) 25 MG tablet Take 12.5 mg by mouth every 12 (twelve) hours.    Yes Historical Provider, MD  potassium chloride SA (K-DUR,KLOR-CON) 20 MEQ tablet Take 40 mEq by mouth daily.   Yes Historical Provider, MD  ticagrelor (BRILINTA) 90 MG TABS tablet Take 90 mg by mouth every 12 (twelve) hours.    Yes Historical Provider, MD  warfarin (COUMADIN) 1 MG tablet Take 1.5 mg by mouth at bedtime.    Yes Historical Provider, MD  metFORMIN (GLUCOPHAGE) 500 MG tablet Take 500 mg by mouth daily with breakfast.     Historical Provider, MD   Physical Exam: Filed Vitals:   11/22/14 1626   BP: 111/75  Pulse: 97  Temp: 98.1 F (36.7 C)  Resp:      General:  Alert, oriented   Eyes: eom-i, perrla   ENT: no oral ulcers   Neck: supple, no JVD   Cardiovascular: s1,s2 RRR, +murmur   Respiratory: few crackle LL  Abdomen: soft, nt, surgical scar multiple staple, wound vac   Skin: no rash   Musculoskeletal: no LE edema  Psychiatric: no hallucinations   Neurologic: CN 2-12 intact, motor 2-12 intact   Labs on Admission:  Basic Metabolic Panel:  Recent Labs Lab 11/22/14 1031  NA 133*  K 3.2*  CL 94*  CO2 24  GLUCOSE 112*  BUN 8  CREATININE 0.55  CALCIUM 10.0   Liver Function Tests:  Recent Labs Lab 11/22/14 1031  AST 42*  ALT 29  ALKPHOS 123*  BILITOT 1.5*  PROT 8.4*  ALBUMIN 3.8   No results for input(s): LIPASE, AMYLASE in the last 168 hours. No results for input(s): AMMONIA in the last 168 hours. CBC:  Recent Labs Lab 11/22/14 1031  WBC 5.3  NEUTROABS 2.6  HGB 12.1  HCT 36.0  MCV 90.2  PLT 342   Cardiac Enzymes:  Recent Labs Lab 11/22/14 1031  TROPONINI 0.03    BNP (last 3 results) No results for input(s): PROBNP in the last 8760 hours. CBG: No results for input(s): GLUCAP in the last 168 hours.  Radiological Exams on Admission: Dg Chest 2 View  11/22/2014   CLINICAL DATA:  Chest pain and mouth heart stations. Recent long hospitalization for myocardial infarction  EXAM: CHEST  2 VIEW  COMPARISON:  None.  FINDINGS: Normal mediastinum and cardiac silhouette. Normal pulmonary vasculature. No evidence of effusion, infiltrate, or pneumothorax. No acute bony abnormality.  IMPRESSION: No acute cardiopulmonary process.   Electronically Signed   By: Suzy Bouchard M.D.   On: 11/22/2014 10:53   Ct Angio Chest Pe W/cm &/or Wo Cm  11/22/2014   CLINICAL DATA:  Chest pain and shortness of breath  EXAM: CT ANGIOGRAPHY CHEST WITH CONTRAST  TECHNIQUE: Multidetector CT imaging of the chest was performed using the standard protocol  during bolus administration of intravenous contrast. Multiplanar CT image reconstructions and MIPs were obtained to evaluate the vascular anatomy.  CONTRAST:  164mL OMNIPAQUE IOHEXOL 350 MG/ML SOLN  COMPARISON:  Chest radiograph November 22, 2014  FINDINGS: There is no demonstrable pulmonary embolus. There is no demonstrable thoracic aortic aneurysm or dissection.  There is patchy atelectatic change in both lower lobes  As well as in the right middle lobe. There is a semi-solid opacity in the superior segment of the right lower lobe measuring 1.1 x 1.1 cm, best seen on axial slice 38 series 6. On axial slice 42 series 6, there is a nodular opacity abutting the pleura in the superior segment of the right lower lobe measuring 3 mm.  There is extensive coronary artery calcification. Pericardium is not appreciably thickened. There is left ventricular hypertrophy.  There is no appreciable thoracic adenopathy.  In the visualized upper abdomen, there is  hepatic steatosis. There is slight hypertrophy of the visualize left adrenal. There is incomplete visualization of an area of decreased attenuation near the fissure for the ligamentum teres measuring 2.5 x 1.9 cm.  There are no blastic or lytic bone lesions. There is no appreciable thyroid lesion.  Review of the MIP images confirms the above findings.  IMPRESSION: No demonstrable pulmonary embolus.  Patchy atelectasis in both lower lobes and right middle lobe.  Semi-solid opacity in the superior segment of the right lower lobe measuring 1.1 x 1.1 cm. This finding may represent a small focus of pneumonia. Atypical neoplasm can present in this manner, however, an in this regard, a followup CT in 10-12 weeks is advised to further assess. A 3 mm nodular opacity in the right lower lobe superior segment is also noted.  No demonstrable adenopathy.  Extensive coronary artery calcification. Left ventricular hypertrophy.  Hepatic steatosis. Incomplete visualization of an apparent  mass near the fissure for the ligamentum teres. While this area potentially could represent focal fatty infiltration, further evaluation of this area appears warranted. Nonemergent pre and postcontrast MR of the liver to further assess would be the optimum study of choice further assessment. If there is a contraindication to MRI, nonemergent pre and multiphasic post-contrast CT would be the alternative imaging study to further evaluate this area.   Electronically Signed   By: Lowella Grip M.D.   On: 11/22/2014 12:01    EKG: Independently reviewed.   Assessment/Plan Principal Problem:   HCAP (healthcare-associated pneumonia)   58 y.o. female with PMH of DM, HTN, recently admitted to [facility] with STEMI, s/p cardiac arrest CPR complicated with liver laceration/repair, s/p PCI multiple stents on brilinta, new DVT on coumadin presented with SOB, non productive cough, associated with palpitations, and URI  1. Early pneumonia; HCAP; no significant leukocytosis; afebrile;  -started IV atx, cont bronchodilators as needed; check influenza; obtain recent imaging results from [facility]  2. Recent STEMI s/p multiple stents; on brilinta, ASA, BB, statin -obtain serial trop; obtain echo: ? MR on exam  3. Recent DVT on coumadin; cont coumadin per pharmacy  4. DM on metformin, no recent HA1c; cont ISS: check a1c; hold metformin;  5. Hyponatremia, hypokalemia likely due to diuretics; replace K; recheck in AM:  6. Hyperbilirubinemia ? Due to recent liver laceration/repair; CT: showed: hepatic steatosis, ? Mass in ligamentum teres vs post surgical changes;  -recheck LFTs in AM; obtain medical records to compare; may need MRI/CT of abd  7. Abdominal wounds, on wound vac, on clindamycin   -consult wound care; hold PO clinda while on IV atx    None;  if consultant consulted, please document name and whether formally or informally consulted  Code Status: full (must indicate code status--if unknown or must be  presumed, indicate so) Family Communication: d/w patient (indicate person spoken with, if applicable, with phone number if by telephone) Disposition Plan: home pend clinical improvement  (indicate anticipated LOS)  Time spent: >45 minutes   Kinnie Feil [facility] Pager [phone]  If 7PM-7AM, please contact night-coverage [email or web address] Password [email or web address] 11/22/2014, 5:07 PM

## 2014-11-23 DIAGNOSIS — E871 Hypo-osmolality and hyponatremia: Secondary | ICD-10-CM

## 2014-11-23 DIAGNOSIS — R7989 Other specified abnormal findings of blood chemistry: Secondary | ICD-10-CM

## 2014-11-23 DIAGNOSIS — I059 Rheumatic mitral valve disease, unspecified: Secondary | ICD-10-CM

## 2014-11-23 DIAGNOSIS — I82409 Acute embolism and thrombosis of unspecified deep veins of unspecified lower extremity: Secondary | ICD-10-CM

## 2014-11-23 DIAGNOSIS — E119 Type 2 diabetes mellitus without complications: Secondary | ICD-10-CM

## 2014-11-23 LAB — GLUCOSE, CAPILLARY
GLUCOSE-CAPILLARY: 101 mg/dL — AB (ref 70–99)
Glucose-Capillary: 114 mg/dL — ABNORMAL HIGH (ref 70–99)
Glucose-Capillary: 95 mg/dL (ref 70–99)
Glucose-Capillary: 118 mg/dL — ABNORMAL HIGH (ref 70–99)

## 2014-11-23 LAB — COMPREHENSIVE METABOLIC PANEL
ALK PHOS: 102 U/L (ref 39–117)
ALT: 25 U/L (ref 0–35)
AST: 33 U/L (ref 0–37)
Albumin: 3.1 g/dL — ABNORMAL LOW (ref 3.5–5.2)
Anion gap: 9 (ref 5–15)
BUN: <5 mg/dL — ABNORMAL LOW (ref 6–23)
CO2: 26 mmol/L (ref 19–32)
Calcium: 9.3 mg/dL (ref 8.4–10.5)
Chloride: 101 mEq/L (ref 96–112)
Creatinine, Ser: 0.57 mg/dL (ref 0.50–1.10)
GFR calc Af Amer: >90 mL/min (ref >90)
GFR calc non Af Amer: >90 mL/min (ref >90)
GLUCOSE: 116 mg/dL — AB (ref 70–99)
Potassium: 3.8 mmol/L (ref 3.5–5.1)
SODIUM: 136 mmol/L (ref 135–145)
TOTAL PROTEIN: 6.9 g/dL (ref 6.0–8.3)
Total Bilirubin: 1.3 mg/dL — ABNORMAL HIGH (ref 0.3–1.2)

## 2014-11-23 LAB — CBC
HCT: 34.5 % — ABNORMAL LOW (ref 36.0–46.0)
HEMOGLOBIN: 11.3 g/dL — AB (ref 12.0–15.0)
MCH: 28.8 pg (ref 26.0–34.0)
MCHC: 32.8 g/dL (ref 30.0–36.0)
MCV: 87.8 fL (ref 78.0–100.0)
Platelets: 294 10*3/uL (ref 150–400)
RBC: 3.93 MIL/uL (ref 3.87–5.11)
RDW: 14.8 % (ref 11.5–15.5)
WBC: 3.8 10*3/uL — ABNORMAL LOW (ref 4.0–10.5)

## 2014-11-23 LAB — HEMOGLOBIN A1C
Hgb A1c MFr Bld: 6.3 % — ABNORMAL HIGH (ref <5.7)
Mean Plasma Glucose: 134 mg/dL — ABNORMAL HIGH (ref <117)

## 2014-11-23 LAB — INFLUENZA PANEL BY PCR (TYPE A & B)
H1N1 flu by pcr: NOT DETECTED
INFLBPCR: NEGATIVE
Influenza A By PCR: NEGATIVE

## 2014-11-23 LAB — PROTIME-INR
INR: 1.45 (ref 0.00–1.49)
PROTHROMBIN TIME: 17.8 s — AB (ref 11.6–15.2)

## 2014-11-23 LAB — TROPONIN I
TROPONIN I: 0.03 ng/mL (ref <0.031)
Troponin I: <0.03 ng/mL (ref <0.031)

## 2014-11-23 MED ORDER — HYDROCODONE-ACETAMINOPHEN 5-325 MG PO TABS
1.0000 | ORAL_TABLET | Freq: Four times a day (QID) | ORAL | Status: DC | PRN
  Administered 2014-11-23: 1 via ORAL
  Filled 2014-11-23: qty 1

## 2014-11-23 MED ORDER — OXYCODONE HCL 5 MG PO TABS: 5.0000 mg | ORAL_TABLET | Freq: Four times a day (QID) | ORAL | Status: DC | PRN

## 2014-11-23 MED ORDER — OXYCODONE HCL 5 MG PO TABS: 5.0000 mg | ORAL_TABLET | Freq: Once | ORAL | Status: DC

## 2014-11-23 MED ORDER — HYDROCODONE-ACETAMINOPHEN 5-325 MG PO TABS: 2.0000 | ORAL_TABLET | Freq: Once | ORAL | Status: DC

## 2014-11-23 MED ORDER — WARFARIN VIDEO
1.0000 | Freq: Once | Status: AC
  Administered 2014-11-24: 1

## 2014-11-23 MED ORDER — COUMADIN BOOK
1.0000 | Freq: Once | Status: AC
  Administered 2014-11-23: 1
  Filled 2014-11-23: qty 1

## 2014-11-23 MED ORDER — WARFARIN SODIUM 5 MG PO TABS
5.0000 mg | ORAL_TABLET | ORAL | Status: AC
  Administered 2014-11-23: 5 mg via ORAL
  Filled 2014-11-23: qty 1

## 2014-11-23 NOTE — Progress Notes (Signed)
[facility]                                                                              Patient Demographics  Marie Tanner, is a 58 y.o. female, DOB - Jul 14, 1956, SNK:[id number]  Admit date - 11/22/2014   Admitting Physician Kelvin Cellar, MD  Outpatient Primary MD for the patient is BLISS, Lynnell Jude, MD  LOS - 1   Chief Complaint  Patient presents with  . Palpitations      Brief history 58 year old female history of diabetes, hypertension, admission to [facility] for STEMI complicated by cardiac arrest, liver laceration repair, presented to the emergency department with complaints of shortness of breath cough and palpitations. Patient admitted for healthcare associated pneumonia and placed on broad-spectrum antibiotics. Trying to obtain records, however, care everywhere yielded no records from [facility].  Assessment & Plan   ?Healthcare associated pneumonia -Patient currently afebrile and no leukocytosis -CT chest: Semisolid opacity in the superior segment of the right lower lobe; follow-up CT 10-12 weeks -Continue bronchodilators, vancomycin, Zosyn  Recent STEMI status post multiple stents -Echocardiogram pending -Continue Brilinta, aspirin, beta blocker, statin -Patient currently chest pain-free -Troponin cycled and negative  Recent DVT -Continue Coumadin per pharmacy  Diabetes mellitus, type II -Metformin currently held -Continue insulin sliding scale CBG monitoring -Hemoglobin A1c pending  Hyponatremia and hypokalemia -Likely secondary to diuretics -Potassium replaced -Continue to monitor CMP  Hyperbilirubinemia/elevated LFTs -Likely secondary to recent liver laceration and repair -LFTs trending downward -CT: shows hepatic steatosis possible mass in the ligamentum teres versus postsurgical changes -May need MRI the abdomen, all continue to monitor LFTs  Abdominal wound with wound VAC -Wound care consulted -Continue IV antibiotics -Patient was on  clindamycin however this was held  Code Status: Full  Family Communication: None at bedside   Disposition Plan: Admitted   Time Spent in minutes   30  minutes  Procedures  None   Consults   None   DVT Prophylaxis  Coumadin   Lab Results  Component Value Date   PLT 294 11/23/2014    Medications  Scheduled Meds: . aspirin EC  81 mg Oral Daily  . atorvastatin  80 mg Oral QHS  . coumadin book  1 each Does not apply Once  . feeding supplement (ENSURE COMPLETE)  237 mL Oral BID BM  . furosemide  40 mg Oral BID  . insulin aspart  0-9 Units Subcutaneous TID WC  . magnesium oxide  1,200 mg Oral TID  . metoprolol tartrate  12.5 mg Oral Q12H  . piperacillin-tazobactam (ZOSYN)  IV  3.375 g Intravenous 3 times per day  . potassium chloride  40 mEq Oral BID  . saccharomyces boulardii  250 mg Oral BID  . sodium chloride  3 mL Intravenous Q12H  . ticagrelor  90 mg Oral Q12H  . vancomycin  1,000 mg Intravenous Q12H  . warfarin  5 mg Oral NOW  . [START ON 11/24/2014] warfarin  1 each Does not apply Once  . Warfarin - Pharmacist Dosing Inpatient   Does not apply q1800  . zolpidem  5 mg Oral Once   Continuous Infusions:  PRN Meds:.acetaminophen, albuterol  Antibiotics  Anti-infectives    Start     Dose/Rate Route Frequency Ordered Stop   11/22/14 2200  piperacillin-tazobactam (ZOSYN) IVPB 3.375 g     3.375 g12.5 mL/hr over 240 Minutes Intravenous 3 times per day 11/22/14 1756     11/22/14 1300  vancomycin (VANCOCIN) IVPB 1000 mg/200 mL premix     1,000 mg200 mL/hr over 60 Minutes Intravenous Every 12 hours 11/22/14 1231     11/22/14 1245  piperacillin-tazobactam (ZOSYN) IVPB 3.375 g     3.375 g100 mL/hr over 30 Minutes Intravenous  Once 11/22/14 1232 11/22/14 1350   11/22/14 1230  piperacillin-tazobactam (ZOSYN) IVPB 3.375 g  Status:  Discontinued     3.375 g12.5 mL/hr over 240 Minutes Intravenous  Once 11/22/14 1222 11/22/14 1232        Subjective:   [name] seen and examined today.  Patient denies any chest pain at this time. She feels her shortness of breath has improved slightly. Patient states that she was recently admitted and discharged from [facility].   Objective:   Filed Vitals:   11/22/14 1626 11/22/14 2002 11/22/14 2235 11/23/14 0408  BP: 111/75 96/61 105/67 93/54  Pulse: 97 85 79 81  Temp: 98.1 F (36.7 C) 97.8 F (36.6 C)  98.2 F (36.8 C)  TempSrc: Oral Oral  Oral  Resp:      Height:      Weight:      SpO2: 100% 96%  96%    Wt Readings from Last 3 Encounters:  11/22/14 77.565 kg (171 lb)     Intake/Output Summary (Last 24 hours) at 11/23/14 2355 Last data filed at 11/22/14 1507  Gross per 24 hour  Intake    370 ml  Output      0 ml  Net    370 ml    Exam  General: Well developed, well nourished, NAD, appears stated age  61: NCAT, mucous membranes moist.   Cardiovascular: S1 S2 auscultated, 2/6 SEM, RRR  Respiratory: Few crackles noted in the lower lobes bilaterally   Abdomen: Soft, nontender, nondistended, + bowel sounds, surgical scars, one  Extremities: warm dry without cyanosis clubbing or edema  Neuro: AAOx3, no focal deficits  Psych:Depressed and tearful   Data Review   Micro Results No results found for this or any previous visit (from the past 240 hour(s)).  Radiology Reports Dg Chest 2 View  11/22/2014   CLINICAL DATA:  Chest pain and mouth heart stations. Recent long hospitalization for myocardial infarction  EXAM: CHEST  2 VIEW  COMPARISON:  None.  FINDINGS: Normal mediastinum and cardiac silhouette. Normal pulmonary vasculature. No evidence of effusion, infiltrate, or pneumothorax. No acute bony abnormality.  IMPRESSION: No acute cardiopulmonary process.   Electronically Signed   By: Suzy Bouchard M.D.   On: 11/22/2014 10:53   Ct Angio Chest Pe W/cm &/or Wo Cm  11/22/2014   CLINICAL DATA:  Chest pain and shortness of breath  EXAM: CT ANGIOGRAPHY CHEST WITH CONTRAST  TECHNIQUE:  Multidetector CT imaging of the chest was performed using the standard protocol during bolus administration of intravenous contrast. Multiplanar CT image reconstructions and MIPs were obtained to evaluate the vascular anatomy.  CONTRAST:  143mL OMNIPAQUE IOHEXOL 350 MG/ML SOLN  COMPARISON:  Chest radiograph November 22, 2014  FINDINGS: There is no demonstrable pulmonary embolus. There is no demonstrable thoracic aortic aneurysm or dissection.  There is patchy atelectatic change in both lower lobes  As well as in the right middle  lobe. There is a semi-solid opacity in the superior segment of the right lower lobe measuring 1.1 x 1.1 cm, best seen on axial slice 38 series 6. On axial slice 42 series 6, there is a nodular opacity abutting the pleura in the superior segment of the right lower lobe measuring 3 mm.  There is extensive coronary artery calcification. Pericardium is not appreciably thickened. There is left ventricular hypertrophy.  There is no appreciable thoracic adenopathy.  In the visualized upper abdomen, there is hepatic steatosis. There is slight hypertrophy of the visualize left adrenal. There is incomplete visualization of an area of decreased attenuation near the fissure for the ligamentum teres measuring 2.5 x 1.9 cm.  There are no blastic or lytic bone lesions. There is no appreciable thyroid lesion.  Review of the MIP images confirms the above findings.  IMPRESSION: No demonstrable pulmonary embolus.  Patchy atelectasis in both lower lobes and right middle lobe.  Semi-solid opacity in the superior segment of the right lower lobe measuring 1.1 x 1.1 cm. This finding may represent a small focus of pneumonia. Atypical neoplasm can present in this manner, however, an in this regard, a followup CT in 10-12 weeks is advised to further assess. A 3 mm nodular opacity in the right lower lobe superior segment is also noted.  No demonstrable adenopathy.  Extensive coronary artery calcification. Left  ventricular hypertrophy.  Hepatic steatosis. Incomplete visualization of an apparent mass near the fissure for the ligamentum teres. While this area potentially could represent focal fatty infiltration, further evaluation of this area appears warranted. Nonemergent pre and postcontrast MR of the liver to further assess would be the optimum study of choice further assessment. If there is a contraindication to MRI, nonemergent pre and multiphasic post-contrast CT would be the alternative imaging study to further evaluate this area.   Electronically Signed   By: Lowella Grip M.D.   On: 11/22/2014 12:01    CBC  Recent Labs Lab 11/22/14 1031 11/23/14 0710  WBC 5.3 3.8*  HGB 12.1 11.3*  HCT 36.0 34.5*  PLT 342 294  MCV 90.2 87.8  MCH 30.3 28.8  MCHC 33.6 32.8  RDW 14.8 14.8  LYMPHSABS 1.5  --   MONOABS 1.1*  --   EOSABS 0.1  --   BASOSABS 0.0  --     Chemistries   Recent Labs Lab 11/22/14 1031 11/23/14 0710  NA 133* 136  K 3.2* 3.8  CL 94* 101  CO2 24 26  GLUCOSE 112* 116*  BUN 8 <5*  CREATININE 0.55 0.57  CALCIUM 10.0 9.3  AST 42* 33  ALT 29 25  ALKPHOS 123* 102  BILITOT 1.5* 1.3*   ------------------------------------------------------------------------------------------------------------------ estimated creatinine clearance is 76.6 mL/min (by C-G formula based on Cr of 0.57). ------------------------------------------------------------------------------------------------------------------ No results for input(s): HGBA1C in the last 72 hours. ------------------------------------------------------------------------------------------------------------------ No results for input(s): CHOL, HDL, LDLCALC, TRIG, CHOLHDL, LDLDIRECT in the last 72 hours. ------------------------------------------------------------------------------------------------------------------ No results for input(s): TSH, T4TOTAL, T3FREE, THYROIDAB in the last 72 hours.  Invalid input(s):  FREET3 ------------------------------------------------------------------------------------------------------------------ No results for input(s): VITAMINB12, FOLATE, FERRITIN, TIBC, IRON, RETICCTPCT in the last 72 hours.  Coagulation profile  Recent Labs Lab 11/22/14 1031 11/23/14 0710  INR 1.65* 1.45    No results for input(s): DDIMER in the last 72 hours.  Cardiac Enzymes  Recent Labs Lab 11/22/14 1031 11/23/14 0108 11/23/14 0710  TROPONINI 0.03 0.03 <0.03   ------------------------------------------------------------------------------------------------------------------ Invalid input(s): POCBNP    Rontavious Albright D.O. on 11/23/2014 at 9:58  AM  Between 7am to 7pm - Pager - [phone]  After 7pm go to [email or web address] - password [email or web address]  And look for the night coverage person covering for me after hours  [facility] Office  [phone]

## 2014-11-23 NOTE — Progress Notes (Signed)
  Echocardiogram 2D Echocardiogram has been performed.  Marie Tanner 11/23/2014, 1:17 PM

## 2014-11-23 NOTE — Progress Notes (Signed)
PHARMACY NOTE  Pharmacy Consult :  58 y.o. female is currently on chronic Coumadin for VTE treatment for recent DVT [10/19/14].  Patient is also on Vancomycin and Zosyn for treatment of HCAP  Dosing Wt :  77 kg  Blood pressure 93/54, pulse 81, temperature 98.2 F (36.8 C), temperature source Oral, resp. rate 0, height 5' 2.5" (1.588 m), weight 171 lb (77.565 kg), SpO2 96 %.   Hematology :  Recent Labs  11/22/14 1031 11/23/14 0710  HGB 12.1 11.3*  HCT 36.0 34.5*  PLT 342 294  LABPROT 19.5* 17.8*  INR 1.65* 1.45  CREATININE 0.55 0.57   Lab Results  Component Value Date   WBC 3.8* 11/23/2014    Current Medication[s] Include: Medication PTA: Prescriptions prior to admission  Medication Sig Last Dose  . acetaminophen (TYLENOL) 500 MG tablet Take 500 mg by mouth 2 (two) times daily as needed (pain). 11/21/2014 at Unknown time  . aspirin EC 81 MG tablet Take 81 mg by mouth daily. 11/21/2014 at Unknown time  . atorvastatin (LIPITOR) 80 MG tablet Take 80 mg by mouth at bedtime.  11/21/2014 at Unknown time  . clindamycin (CLEOCIN) 300 MG capsule Take 300 mg by mouth every 6 (six) hours. 10 day course started 11/16/14 11/22/2014 at 630  . furosemide (LASIX) 40 MG tablet Take 40 mg by mouth 2 (two) times daily. 11/21/2014 at Unknown time  . Lactobacillus-Inulin ([place]) CAPS Take 1 capsule by mouth daily. 11/21/2014 at Unknown time  . magnesium oxide (MAG-OX) 400 MG tablet Take 1,200 mg by mouth 3 (three) times daily.  11/21/2014 at Unknown time  . metoprolol tartrate (LOPRESSOR) 25 MG tablet Take 12.5 mg by mouth every 12 (twelve) hours.  11/21/2014 at 2100  . potassium chloride SA (K-DUR,KLOR-CON) 20 MEQ tablet Take 40 mEq by mouth daily. 11/21/2014 at Unknown time  . ticagrelor (BRILINTA) 90 MG TABS tablet Take 90 mg by mouth every 12 (twelve) hours.  11/21/2014 at 2100  . warfarin (COUMADIN) 1 MG tablet Take 1.5 mg by mouth at  bedtime.  11/21/2014 at Unknown time  . metFORMIN (GLUCOPHAGE) 500 MG tablet Take 500 mg by mouth daily with breakfast.  11/21/2014   Scheduled:  Scheduled:  . aspirin EC  81 mg Oral Daily  . atorvastatin  80 mg Oral QHS  . feeding supplement (ENSURE COMPLETE)  237 mL Oral BID BM  . furosemide  40 mg Oral BID  . insulin aspart  0-9 Units Subcutaneous TID WC  . magnesium oxide  1,200 mg Oral TID  . metoprolol tartrate  12.5 mg Oral Q12H  . piperacillin-tazobactam (ZOSYN)  IV  3.375 g Intravenous 3 times per day  . potassium chloride  40 mEq Oral BID  . saccharomyces boulardii  250 mg Oral BID  . sodium chloride  3 mL Intravenous Q12H  . ticagrelor  90 mg Oral Q12H  . vancomycin  1,000 mg Intravenous Q12H  . Warfarin - Pharmacist Dosing Inpatient   Does not apply q1800  . zolpidem  5 mg Oral Once   Infusion[s]: Infusions:  None Antibiotic[s]: Anti-infectives    Start     Dose/Rate Route Frequency Ordered Stop   11/22/14 2200  piperacillin-tazobactam (ZOSYN) IVPB 3.375 g     3.375 g12.5 mL/hr over 240 Minutes Intravenous 3 times per day 11/22/14 1756     11/22/14 1300  vancomycin (VANCOCIN) IVPB 1000 mg/200 mL premix     1,000 mg200 mL/hr over 60 Minutes Intravenous Every 12  hours 11/22/14 1231     11/22/14 1245  piperacillin-tazobactam (ZOSYN) IVPB 3.375 g     3.375 g100 mL/hr over 30 Minutes Intravenous  Once 11/22/14 1232 11/22/14 1350   11/22/14 1230  piperacillin-tazobactam (ZOSYN) IVPB 3.375 g  Status:  Discontinued     3.375 g12.5 mL/hr over 240 Minutes Intravenous  Once 11/22/14 1222 11/22/14 1232      Assessment :  Day # 2 Vancomycin and Zosyn for HCAP  Today's INR falling despite increase dose of Coumadin last PM.   INR  1.65 > 1.45.    Patient in not on any Lovenox nor Heparin bridging.  She is not on any SCD's, TED's, etc.  No evidence of bleeding complications observed.  Goal :  INR goal is 2-3 Vancomycin and Zosyn dosed for clinical indication and adjusted  for renal function. Eradication of infection.  Plan : 1. Will give Coumadin 5 mg dose now, to prevent further drop in INR. 2. Daily INR's, CBC. Monitor for bleeding complications.   Follow Platelet counts. 3. Recommend adding additional measure for bridging until INR returned to therapeutic levels. 4. Continue antibiotics as ordered. Monitor renal function, WBC, fever curve, any cultures/sensitivities, antibiotic levels as clinically indicated, and clinical progression.   Asaad Gulley, Craig Guess,  Pharm.D  11/23/2014  8:42 AM

## 2014-11-24 DIAGNOSIS — E119 Type 2 diabetes mellitus without complications: Secondary | ICD-10-CM

## 2014-11-24 DIAGNOSIS — I429 Cardiomyopathy, unspecified: Secondary | ICD-10-CM

## 2014-11-24 DIAGNOSIS — I82409 Acute embolism and thrombosis of unspecified deep veins of unspecified lower extremity: Secondary | ICD-10-CM | POA: Diagnosis present

## 2014-11-24 DIAGNOSIS — R0600 Dyspnea, unspecified: Secondary | ICD-10-CM

## 2014-11-24 LAB — BRAIN NATRIURETIC PEPTIDE: B Natriuretic Peptide: 379.4 pg/mL — ABNORMAL HIGH (ref 0.0–100.0)

## 2014-11-24 LAB — GLUCOSE, CAPILLARY
GLUCOSE-CAPILLARY: 100 mg/dL — AB (ref 70–99)
Glucose-Capillary: 108 mg/dL — ABNORMAL HIGH (ref 70–99)
Glucose-Capillary: 102 mg/dL — ABNORMAL HIGH (ref 70–99)
Glucose-Capillary: 111 mg/dL — ABNORMAL HIGH (ref 70–99)

## 2014-11-24 LAB — CBC
HEMATOCRIT: 33.3 % — AB (ref 36.0–46.0)
HEMOGLOBIN: 10.6 g/dL — AB (ref 12.0–15.0)
MCH: 28.1 pg (ref 26.0–34.0)
MCHC: 31.8 g/dL (ref 30.0–36.0)
MCV: 88.3 fL (ref 78.0–100.0)
Platelets: 309 10*3/uL (ref 150–400)
RBC: 3.77 MIL/uL — AB (ref 3.87–5.11)
RDW: 15 % (ref 11.5–15.5)
WBC: 4.1 10*3/uL (ref 4.0–10.5)

## 2014-11-24 LAB — PROTIME-INR
INR: 1.81 — ABNORMAL HIGH (ref 0.00–1.49)
Prothrombin Time: 21.1 seconds — ABNORMAL HIGH (ref 11.6–15.2)

## 2014-11-24 LAB — CLOSTRIDIUM DIFFICILE BY PCR: CDIFFPCR: NEGATIVE

## 2014-11-24 MED ORDER — ENSURE COMPLETE PO LIQD
237.0000 mL | Freq: Every day | ORAL | Status: DC
  Administered 2014-11-24: 237 mL via ORAL

## 2014-11-24 MED ORDER — FUROSEMIDE 10 MG/ML IJ SOLN
40.0000 mg | Freq: Two times a day (BID) | INTRAMUSCULAR | Status: DC
  Administered 2014-11-24 – 2014-11-26 (×4): 40 mg via INTRAVENOUS
  Filled 2014-11-24 (×7): qty 4

## 2014-11-24 MED ORDER — LISINOPRIL 2.5 MG PO TABS
2.5000 mg | ORAL_TABLET | Freq: Every day | ORAL | Status: DC
  Administered 2014-11-24 – 2014-11-26 (×3): 2.5 mg via ORAL
  Filled 2014-11-24 (×4): qty 1

## 2014-11-24 MED ORDER — HYDROCODONE-ACETAMINOPHEN 5-325 MG PO TABS: 1.0000 | ORAL_TABLET | Freq: Four times a day (QID) | ORAL | Status: DC | PRN

## 2014-11-24 MED ORDER — WARFARIN SODIUM 2.5 MG PO TABS
2.5000 mg | ORAL_TABLET | Freq: Once | ORAL | Status: AC
  Administered 2014-11-24: 2.5 mg via ORAL
  Filled 2014-11-24 (×2): qty 1

## 2014-11-24 NOTE — Progress Notes (Signed)
Chart reviewed.    [facility]                                                                              Patient Demographics  Marie Tanner, is a 58 y.o. female, DOB - [birth year], QIH:[id number]  Admit date - 11/22/2014   Admitting Physician Kelvin Cellar, MD  Outpatient Primary MD for the patient is BLISS, Lynnell Jude, MD  LOS - 2   Chief Complaint  Patient presents with  . Palpitations      Brief history 58 year old female history of diabetes, hypertension, admission to [facility] for STEMI complicated by cardiac arrest, liver laceration repair, presented to the emergency department with complaints of shortness of breath cough and palpitations. Patient admitted for healthcare associated pneumonia and placed on broad-spectrum antibiotics. Trying to obtain records, however, care everywhere yielded no records from [place].  Assessment & Plan   ?Healthcare associated pneumonia -Patient currently afebrile and no leukocytosis -CT chest: Semisolid opacity in the superior segment of the right lower lobe; follow-up CT 10-12 weeks -Continue bronchodilators, vancomycin, Zosyn  Recent STEMI status post multiple stents Echo shows EF 40%. No records yet from [place]. Will give lasix 40 mg IV bid and add low dose ACE I, as patient c/o worse dyspnea today. Daily weights, ins and outs  Cardiomyopathy: need records  Recent DVT -Continue Coumadin per pharmacy  Diabetes mellitus, type II -Metformin currently held Continue SSI. hgb a1c 6.3  Hyponatremia and hypokalemia Recheck in am  Abdominal wound with wound VAC -Wound care consulted -Continue IV antibiotics -Patient was on clindamycin however this was held  Deconditioning: PT eval  Diarrhea: c diff pending  Code Status: Full  Family Communication: None at bedside   Disposition Plan: Admitted   Time Spent in minutes   30  minutes  Procedures  None   Consults   None   DVT Prophylaxis  Coumadin   Lab Results    Component Value Date   PLT 309 11/24/2014    Medications  Scheduled Meds: . aspirin EC  81 mg Oral Daily  . atorvastatin  80 mg Oral QHS  . feeding supplement (ENSURE COMPLETE)  237 mL Oral Q2000  . furosemide  40 mg Intravenous BID  . HYDROcodone-acetaminophen  2 tablet Oral Once  . insulin aspart  0-9 Units Subcutaneous TID WC  . lisinopril  2.5 mg Oral Daily  . magnesium oxide  1,200 mg Oral TID  . metoprolol tartrate  12.5 mg Oral Q12H  . piperacillin-tazobactam (ZOSYN)  IV  3.375 g Intravenous 3 times per day  . potassium chloride  40 mEq Oral BID  . saccharomyces boulardii  250 mg Oral BID  . sodium chloride  3 mL Intravenous Q12H  . ticagrelor  90 mg Oral Q12H  . vancomycin  1,000 mg Intravenous Q12H  . warfarin  2.5 mg Oral ONCE-1800  . Warfarin - Pharmacist Dosing Inpatient   Does not apply q1800   Continuous Infusions:  PRN Meds:.acetaminophen, albuterol, HYDROcodone-acetaminophen  Antibiotics    Anti-infectives    Start     Dose/Rate Route Frequency Ordered Stop   11/22/14 2200  piperacillin-tazobactam (ZOSYN) IVPB 3.375 g     3.375  g12.5 mL/hr over 240 Minutes Intravenous 3 times per day 11/22/14 1756     11/22/14 1300  vancomycin (VANCOCIN) IVPB 1000 mg/200 mL premix     1,000 mg200 mL/hr over 60 Minutes Intravenous Every 12 hours 11/22/14 1231     11/22/14 1245  piperacillin-tazobactam (ZOSYN) IVPB 3.375 g     3.375 g100 mL/hr over 30 Minutes Intravenous  Once 11/22/14 1232 11/22/14 1350   11/22/14 1230  piperacillin-tazobactam (ZOSYN) IVPB 3.375 g  Status:  Discontinued     3.375 g12.5 mL/hr over 240 Minutes Intravenous  Once 11/22/14 1222 11/22/14 1232        Subjective:   C/o dyspnea worse this am. Loose stool x3 today. Some cough, sore throat and rhinorhea, though mild. No f/c.  Objective:   Filed Vitals:   11/23/14 1501 11/23/14 2112 11/24/14 0437 11/24/14 1458  BP: 102/69 102/70 95/57 104/72  Pulse: 89 78 71 70  Temp: 98.6 F (37 C) 97.8  F (36.6 C) 98.6 F (37 C) 98.8 F (37.1 C)  TempSrc: Oral Oral Oral Oral  Resp: 18 19 20 18   Height:      Weight:      SpO2: 97% 100% 100% 100%    Wt Readings from Last 3 Encounters:  11/22/14 77.565 kg (171 lb)     Intake/Output Summary (Last 24 hours) at 11/24/14 1625 Last data filed at 11/24/14 1300  Gross per 24 hour  Intake    240 ml  Output      0 ml  Net    240 ml    Exam  General: Well developed, well nourished, NAD, appears stated age  Cardiovascular: S1 S2 auscultated, 2/6 SEM, RRR  Respiratory: CTA without wheezes rhonchi or rales  Abdomen: Soft, nontender, nondistended, + bowel sounds, midline incision with staples. No drainage erythema. Wound VAC in place.  Extremities: warm dry without cyanosis clubbing or edema  Psych:  Appears depressed.   Data Review   Micro Results No results found for this or any previous visit (from the past 240 hour(s)).  Radiology Reports Dg Chest 2 View  11/22/2014   CLINICAL DATA:  Chest pain and mouth heart stations. Recent long hospitalization for myocardial infarction  EXAM: CHEST  2 VIEW  COMPARISON:  None.  FINDINGS: Normal mediastinum and cardiac silhouette. Normal pulmonary vasculature. No evidence of effusion, infiltrate, or pneumothorax. No acute bony abnormality.  IMPRESSION: No acute cardiopulmonary process.   Electronically Signed   By: Suzy Bouchard M.D.   On: 11/22/2014 10:53   Ct Angio Chest Pe W/cm &/or Wo Cm  11/22/2014   CLINICAL DATA:  Chest pain and shortness of breath  EXAM: CT ANGIOGRAPHY CHEST WITH CONTRAST  TECHNIQUE: Multidetector CT imaging of the chest was performed using the standard protocol during bolus administration of intravenous contrast. Multiplanar CT image reconstructions and MIPs were obtained to evaluate the vascular anatomy.  CONTRAST:  123mL OMNIPAQUE IOHEXOL 350 MG/ML SOLN  COMPARISON:  Chest radiograph November 22, 2014  FINDINGS: There is no demonstrable pulmonary embolus. There  is no demonstrable thoracic aortic aneurysm or dissection.  There is patchy atelectatic change in both lower lobes  As well as in the right middle lobe. There is a semi-solid opacity in the superior segment of the right lower lobe measuring 1.1 x 1.1 cm, best seen on axial slice 38 series 6. On axial slice 42 series 6, there is a nodular opacity abutting the pleura in the superior segment of the right lower lobe  measuring 3 mm.  There is extensive coronary artery calcification. Pericardium is not appreciably thickened. There is left ventricular hypertrophy.  There is no appreciable thoracic adenopathy.  In the visualized upper abdomen, there is hepatic steatosis. There is slight hypertrophy of the visualize left adrenal. There is incomplete visualization of an area of decreased attenuation near the fissure for the ligamentum teres measuring 2.5 x 1.9 cm.  There are no blastic or lytic bone lesions. There is no appreciable thyroid lesion.  Review of the MIP images confirms the above findings.  IMPRESSION: No demonstrable pulmonary embolus.  Patchy atelectasis in both lower lobes and right middle lobe.  Semi-solid opacity in the superior segment of the right lower lobe measuring 1.1 x 1.1 cm. This finding may represent a small focus of pneumonia. Atypical neoplasm can present in this manner, however, an in this regard, a followup CT in 10-12 weeks is advised to further assess. A 3 mm nodular opacity in the right lower lobe superior segment is also noted.  No demonstrable adenopathy.  Extensive coronary artery calcification. Left ventricular hypertrophy.  Hepatic steatosis. Incomplete visualization of an apparent mass near the fissure for the ligamentum teres. While this area potentially could represent focal fatty infiltration, further evaluation of this area appears warranted. Nonemergent pre and postcontrast MR of the liver to further assess would be the optimum study of choice further assessment. If there is a  contraindication to MRI, nonemergent pre and multiphasic post-contrast CT would be the alternative imaging study to further evaluate this area.   Electronically Signed   By: Lowella Grip M.D.   On: 11/22/2014 12:01    CBC  Recent Labs Lab 11/22/14 1031 11/23/14 0710 11/24/14 0310  WBC 5.3 3.8* 4.1  HGB 12.1 11.3* 10.6*  HCT 36.0 34.5* 33.3*  PLT 342 294 309  MCV 90.2 87.8 88.3  MCH 30.3 28.8 28.1  MCHC 33.6 32.8 31.8  RDW 14.8 14.8 15.0  LYMPHSABS 1.5  --   --   MONOABS 1.1*  --   --   EOSABS 0.1  --   --   BASOSABS 0.0  --   --     Chemistries   Recent Labs Lab 11/22/14 1031 11/23/14 0710  NA 133* 136  K 3.2* 3.8  CL 94* 101  CO2 24 26  GLUCOSE 112* 116*  BUN 8 <5*  CREATININE 0.55 0.57  CALCIUM 10.0 9.3  AST 42* 33  ALT 29 25  ALKPHOS 123* 102  BILITOT 1.5* 1.3*   ------------------------------------------------------------------------------------------------------------------ estimated creatinine clearance is 76.6 mL/min (by C-G formula based on Cr of 0.57). ------------------------------------------------------------------------------------------------------------------  Recent Labs  11/22/14 1852  HGBA1C 6.3*   ------------------------------------------------------------------------------------------------------------------ No results for input(s): CHOL, HDL, LDLCALC, TRIG, CHOLHDL, LDLDIRECT in the last 72 hours. ------------------------------------------------------------------------------------------------------------------ No results for input(s): TSH, T4TOTAL, T3FREE, THYROIDAB in the last 72 hours.  Invalid input(s): FREET3 ------------------------------------------------------------------------------------------------------------------ No results for input(s): VITAMINB12, FOLATE, FERRITIN, TIBC, IRON, RETICCTPCT in the last 72 hours.  Coagulation profile  Recent Labs Lab 11/22/14 1031 11/23/14 0710 11/24/14 0310  INR 1.65* 1.45  1.81*    No results for input(s): DDIMER in the last 72 hours.  Cardiac Enzymes  Recent Labs Lab 11/23/14 0108 11/23/14 0710 11/23/14 1256  TROPONINI 0.03 <0.03 <0.03   ------------------------------------------------------------------------------------------------------------------ Invalid input(s): POCBNP    Delfina Redwood MD on 11/24/2014 at 4:25 PM  Text page [email or web address] - password [email or web address]  [facility]

## 2014-11-24 NOTE — Progress Notes (Signed)
ANTICOAGULATION CONSULT NOTE - Follow Up Consult  Pharmacy Consult for Warfarin Indication: Hx recent DVT  Allergies  Allergen Reactions  . Epinephrine Other (See Comments)    Severe anxiety  . Lactose Intolerance (Gi) Diarrhea    Patient Measurements: Height: 5' 2.5" (158.8 cm) Weight: 171 lb (77.565 kg) IBW/kg (Calculated) : 51.25  Vital Signs: Temp: 98.8 F (37.1 C) (12/26 1458) Temp Source: Oral (12/26 1458) BP: 104/72 mmHg (12/26 1458) Pulse Rate: 70 (12/26 1458)  Labs:  Recent Labs  11/22/14 1031 11/23/14 0108 11/23/14 0710 11/23/14 1256 11/24/14 0310  HGB 12.1  --  11.3*  --  10.6*  HCT 36.0  --  34.5*  --  33.3*  PLT 342  --  294  --  309  LABPROT 19.5*  --  17.8*  --  21.1*  INR 1.65*  --  1.45  --  1.81*  CREATININE 0.55  --  0.57  --   --   TROPONINI 0.03 0.03 <0.03 <0.03  --     Estimated Creatinine Clearance: 76.6 mL/min (by C-G formula based on Cr of 0.57).  Assessment: 65 YOF who continues on warfarin from PTA for hx recent DVT (Nov '15) with a SUBtherapeutic INR this morning though trending up (INR 1.81 << 1.45, goal of 2-3). Hgb/Hct slow trend down, plts wnl. No overt s/sx of bleeding noted - will continue to watch H/H trends.   If INR remains SUBtherapeutic on 12/27 - consider bridging with lovenox SQ.   Goal of Therapy:  INR 2-3   Plan:  1. Warfarin 2.5 mg x 1 dose at 1800 today 2. Will continue to monitor for any signs/symptoms of bleeding and will follow up with PT/INR in the a.m.   [name], PharmD, BCPS Clinical Pharmacist Pager: [phone] 11/24/2014 3:40 PM

## 2014-11-24 NOTE — Progress Notes (Signed)
INITIAL NUTRITION ASSESSMENT  DOCUMENTATION CODES Per approved criteria  -Obesity Unspecified   INTERVENTION:  Ensure Complete PO once daily, each supplement provides 350 kcal and 13 grams of protein  NUTRITION DIAGNOSIS: Inadequate oral intake related to poor appetite as evidenced by patient report.   Goal: Intake to meet >90% of estimated nutrition needs.  Monitor:  PO intake, labs, weight trend.  Reason for Assessment: MST  58 y.o. female  Admitting Dx: HCAP (healthcare-associated pneumonia)  ASSESSMENT: 58 y.o. female with PMH of DM, HTN, recently admitted to [facility] with STEMI, s/p cardiac arrest CPR complicated with liver laceration/repair; presented on 12/24 with SOB, non productive cough, associated with palpitations, and URI; initial CXR: unremarkable, but f/u CT showed early pneumonia.   Patient reports that she has been eating poorly since she got sick approximately 1 month ago. She thinks she has lost 24 lbs in that time frame. Usual weight ~230 lbs, was trying to lose weight and lost to ~195 lbs in November, now down to 171 lbs. She is consuming ~75% of meals. She is reluctant to try Ensure supplements, but agreed to try chocolate flavor after supper tonight.  Height: Ht Readings from Last 1 Encounters:  11/22/14 5' 2.5" (1.588 m)    Weight: Wt Readings from Last 1 Encounters:  11/22/14 171 lb (77.565 kg)    Ideal Body Weight: 51.1 kg  % Ideal Body Weight: 152%  Wt Readings from Last 10 Encounters:  11/22/14 171 lb (77.565 kg)    Usual Body Weight: 195 lbs (one month ago)  % Usual Body Weight: 88%  BMI:  Body mass index is 30.76 kg/(m^2).  Estimated Nutritional Needs: Kcal: 1700-1900 Protein: 80-95 gm Fluid: 1.8 L  Skin: no issues  Diet Order: Diet Carb Modified  EDUCATION NEEDS: -Education needs addressed   Intake/Output Summary (Last 24 hours) at 11/24/14 1249 Last data filed at 11/24/14 0900  Gross per 24 hour  Intake    240 ml   Output      0 ml  Net    240 ml    Last BM: 12/25   Labs:   Recent Labs Lab 11/22/14 1031 11/23/14 0710  NA 133* 136  K 3.2* 3.8  CL 94* 101  CO2 24 26  BUN 8 <5*  CREATININE 0.55 0.57  CALCIUM 10.0 9.3  GLUCOSE 112* 116*    CBG (last 3)   Recent Labs  11/23/14 2111 11/24/14 0603 11/24/14 1112  GLUCAP 118* 108* 102*    Scheduled Meds: . aspirin EC  81 mg Oral Daily  . atorvastatin  80 mg Oral QHS  . feeding supplement (ENSURE COMPLETE)  237 mL Oral Q2000  . furosemide  40 mg Oral BID  . HYDROcodone-acetaminophen  2 tablet Oral Once  . insulin aspart  0-9 Units Subcutaneous TID WC  . magnesium oxide  1,200 mg Oral TID  . metoprolol tartrate  12.5 mg Oral Q12H  . piperacillin-tazobactam (ZOSYN)  IV  3.375 g Intravenous 3 times per day  . potassium chloride  40 mEq Oral BID  . saccharomyces boulardii  250 mg Oral BID  . sodium chloride  3 mL Intravenous Q12H  . ticagrelor  90 mg Oral Q12H  . vancomycin  1,000 mg Intravenous Q12H  . Warfarin - Pharmacist Dosing Inpatient   Does not apply q1800  . zolpidem  5 mg Oral Once    Continuous Infusions:   Past Medical History  Diagnosis Date  . Renal disorder   .  Coronary artery disease   . Liver laceration 10/19/14  . Pulmonary edema 10/19/14  . DVT (deep venous thrombosis) 11/15    left femerol artery injury during resusciation.  [name] STEMI (ST elevation myocardial infarction) 10/19/14  . Hypertension   . High cholesterol   . Heart murmur   . HCAP (healthcare-associated pneumonia) 11/22/2014  . Sleep apnea     "suppose to wear mask but I don't" (11/22/2014)  . Type II diabetes mellitus   . History of blood transfusion 09/2014    related to MI  . GERD (gastroesophageal reflux disease)   . History of gout     Past Surgical History  Procedure Laterality Date  . Cesarean section  1980; 1982  . Laparoscopic abdominal exploration    . Liver laceration repair  09/2014    related to CPR/notes 11/22/2014   . Tracheostomy  09/2014    "closed on it's own when they took it out"  . Femerol artery repair Right 09/2014    pt insists it was a right femoral artery repair on 11/22/2014  . Extracorporeal circulation    . Left ventricular assist device      pt is not aware of this hx on 11/22/2014  . Abdominal hysterectomy  ~ 2000  . Tubal ligation  1982  . Coronary angioplasty with stent placement  09/2014    "multiple"/notes 11/22/2014  . Application of wound vac  10/2014    "over my naval" (11/22/2014)    Molli Barrows, [place], [place], [place] Pager [phone] After Hours Pager [phone]

## 2014-11-25 LAB — BASIC METABOLIC PANEL
Anion gap: 6 (ref 5–15)
CALCIUM: 9 mg/dL (ref 8.4–10.5)
CO2: 27 mmol/L (ref 19–32)
Chloride: 99 mEq/L (ref 96–112)
Creatinine, Ser: 0.56 mg/dL (ref 0.50–1.10)
GFR calc Af Amer: >90 mL/min (ref >90)
GLUCOSE: 116 mg/dL — AB (ref 70–99)
POTASSIUM: 3.5 mmol/L (ref 3.5–5.1)
Sodium: 132 mmol/L — ABNORMAL LOW (ref 135–145)

## 2014-11-25 LAB — GLUCOSE, CAPILLARY
Glucose-Capillary: 114 mg/dL — ABNORMAL HIGH (ref 70–99)
Glucose-Capillary: 87 mg/dL (ref 70–99)
Glucose-Capillary: 99 mg/dL (ref 70–99)

## 2014-11-25 LAB — PROTIME-INR
INR: 1.82 — ABNORMAL HIGH (ref 0.00–1.49)
PROTHROMBIN TIME: 21.3 s — AB (ref 11.6–15.2)

## 2014-11-25 MED ORDER — ENOXAPARIN SODIUM 80 MG/0.8ML ~~LOC~~ SOLN
80.0000 mg | Freq: Two times a day (BID) | SUBCUTANEOUS | Status: DC
  Filled 2014-11-25 (×3): qty 0.8

## 2014-11-25 MED ORDER — ENOXAPARIN SODIUM 80 MG/0.8ML ~~LOC~~ SOLN
80.0000 mg | Freq: Once | SUBCUTANEOUS | Status: AC
  Administered 2014-11-25: 80 mg via SUBCUTANEOUS
  Filled 2014-11-25 (×2): qty 0.8

## 2014-11-25 MED ORDER — WARFARIN SODIUM 5 MG PO TABS
5.0000 mg | ORAL_TABLET | Freq: Once | ORAL | Status: AC
  Administered 2014-11-25: 5 mg via ORAL
  Filled 2014-11-25: qty 1

## 2014-11-25 NOTE — Consult Note (Addendum)
WOC wound consult note Reason for Consult: Consult requested for bilat breast wound wounds, groin wounds, and abd wound.  She is followed by surgeon at [facility] for all sites. Wound type: Midline abd with healing full-thickness wound; 2X1X.2cm, 100% beefy red, small amt yellow drainage, no odor.  Pt has Freedom Vac from home.  Changed dressing using one small piece black foam and another piece to cover for track pad suction.  Cont suction on at 139mm.  Pt has cannister and charger in room at bedside. Tolerated without c/o discomfort. Measurement: Left and right groin with stapes intact and full thickness healing wounds.  Left groin .2X.2X.1cm and .3X.2X.1cm.  Right groin 1.5X.3X.1cm.  All sites are beefy red with minimal yellow drainage, no odor. Bilat breat folds with sutures and dry scabs intact.  Left 1X1cm and right 1X1.2cm with area of yellow slough .2X.2cm Dressing procedure/placement/frequency: Continue present plan of care with Vac dressing 3Xweek;  plan to change Wed if still in the hospital at that time.  Pt can return home with the Freedom machine after discharge.  Continue present plan of care to breast and groin wounds as ordeed by her surgeon; cleanse with NS, paint with betadine swab, and cover with dry gauze.  She plans to followup with her surgeon at [facility] on Jan 4. Please re-consult if further assistance is needed.  Thank-you,  Julien Girt MSN, [place], [place], [place], [place]

## 2014-11-25 NOTE — Evaluation (Signed)
Physical Therapy Evaluation and Discharge Patient Details Name: Marie Tanner MRN: [id number] DOB: November 08, 1956 Today's Date: 11/25/2014   History of Present Illness  58 year old female history of diabetes, hypertension, admission to [facility] for STEMI complicated by cardiac arrest, liver laceration repair, presented to the emergency department with complaints of shortness of breath cough and palpitations. Patient admitted for healthcare associated pneumonia and placed on broad-spectrum antibiotics.  Clinical Impression  Patient evaluated by Physical Therapy with no further acute PT needs identified. All education has been completed and the patient has no further questions. Ambulates generally well up to 280 feet without an assistive device. Safely completed stair training this afternoon, and states she feels confident with her ability to mobilize. See below for any follow-up Physial Therapy or equipment needs. PT is signing off. Thank you for this referral.     Follow Up Recommendations Outpatient PT    Equipment Recommendations  Other (comment) (Tub bench)    Recommendations for Other Services       Precautions / Restrictions Precautions Precautions: None Restrictions Weight Bearing Restrictions: No      Mobility  Bed Mobility                  Transfers Overall transfer level: Independent Equipment used: None             General transfer comment: Independently stood from [facility]  Ambulation/Gait Ambulation/Gait assistance: Supervision Ambulation Distance (Feet): 280 Feet Assistive device: None Gait Pattern/deviations: Step-through pattern;Decreased stride length Gait velocity: decreased Gait velocity interpretation: Below normal speed for age/gender General Gait Details: Slow and guarded but with good stability. Did not require an assistive device. Turns well without loss of balance. Supervision for safety only.  Stairs Stairs: Yes Stairs assistance:  Supervision Stair Management: One rail Right;Step to pattern;Sideways;Forwards Number of Stairs: 4 (x3) General stair comments: Educated on safe stair navigation techniques with cues for sequencing and use of rail. Practiced forward approach as well as sideways. Pt states she feels confident with this task after practice.  Wheelchair Mobility    Modified Rankin (Stroke Patients Only)       Balance Overall balance assessment: Needs assistance Sitting-balance support: No upper extremity supported;Feet supported Sitting balance-Leahy Scale: Good     Standing balance support: No upper extremity supported Standing balance-Leahy Scale: Good                               Pertinent Vitals/Pain Pain Assessment: No/denies pain    Home Living Family/patient expects to be discharged to:: Private residence Living Arrangements: Children Available Help at Discharge: Family;Available 24 hours/day Type of Home: House Home Access: Stairs to enter Entrance Stairs-Rails: Left Entrance Stairs-Number of Steps: 5 Home Layout: Two level;Bed/bath upstairs Home Equipment: Walker - 2 wheels;Bedside commode      Prior Function Level of Independence: Needs assistance   Gait / Transfers Assistance Needed: Walker for ambulation  ADL's / Homemaking Assistance Needed: Needed assist with bath/dress  (Independent prior to MI, needs assist since MI in november)        Hand Dominance   Dominant Hand: Right    Extremity/Trunk Assessment   Upper Extremity Assessment: Defer to OT evaluation           Lower Extremity Assessment: Overall WFL for tasks assessed         Communication   Communication: No difficulties  Cognition Arousal/Alertness: Awake/alert Behavior During Therapy: WFL for tasks assessed/performed Overall  Cognitive Status: Within Functional Limits for tasks assessed                      General Comments General comments (skin integrity, edema, etc.):  Spo2 97-98% on room air. HR up to 90 bpm    Exercises General Exercises - Lower Extremity Ankle Circles/Pumps: AROM;Both;20 reps;Seated Long Arc Quad: Strengthening;Both;10 reps;Seated Hip Flexion/Marching: Strengthening;Both;10 reps;Seated      Assessment/Plan    PT Assessment Patent does not need any further PT services  PT Diagnosis Abnormality of gait;Difficulty walking   PT Problem List    PT Treatment Interventions     PT Goals (Current goals can be found in the Care Plan section) Acute Rehab PT Goals Patient Stated Goal: Go home PT Goal Formulation: All assessment and education complete, DC therapy    Frequency     Barriers to discharge        Co-evaluation               End of Session Equipment Utilized During Treatment: Gait belt Activity Tolerance: Patient tolerated treatment well Patient left: in chair;with call bell/phone within reach Nurse Communication: Mobility status         Time: 9774-1423 PT Time Calculation (min) (ACUTE ONLY): 28 min   Charges:   PT Evaluation $Initial PT Evaluation Tier I: 1 Procedure PT Treatments $Gait Training: 8-22 mins   PT G CodesEllouise Newer 11/25/2014, 10:09 AM Elayne Snare, [place]

## 2014-11-25 NOTE — Progress Notes (Addendum)
ANTICOAGULATION CONSULT NOTE - Follow Up Consult  Pharmacy Consult for Warfarin + Vanc/Zosyn Indication: Hx recent DVT & empiric HCAP coverage  Allergies  Allergen Reactions  . Epinephrine Other (See Comments)    Severe anxiety  . Lactose Intolerance (Gi) Diarrhea    Patient Measurements: Height: 5' 2.5" (158.8 cm) Weight: 178 lb 9.2 oz (81 kg) IBW/kg (Calculated) : 51.25  Vital Signs: Temp: 97.8 F (36.6 C) (12/27 0453) Temp Source: Oral (12/27 0453) BP: 96/63 mmHg (12/27 0534) Pulse Rate: 67 (12/27 0534)  Labs:  Recent Labs  11/23/14 0108 11/23/14 0710 11/23/14 1256 11/24/14 0310 11/25/14 0554  HGB  --  11.3*  --  10.6*  --   HCT  --  34.5*  --  33.3*  --   PLT  --  294  --  309  --   LABPROT  --  17.8*  --  21.1* 21.3*  INR  --  1.45  --  1.81* 1.82*  CREATININE  --  0.57  --   --  0.56  TROPONINI 0.03 <0.03 <0.03  --   --     Estimated Creatinine Clearance: 78.3 mL/min (by C-G formula based on Cr of 0.56).  Assessment: 22 YOF who continues on warfarin from [facility] for hx recent DVT (Nov '15) with a SUBtherapeutic INR this morning (INR 1.82 << 1.81, goal of 2-3). Hgb/Hct slow trend down, plts wnl. No overt s/sx of bleeding noted - will continue to watch H/H trends.   Discussed PTA warfarin dose with patient. The patient stated that the INR rose rapidly while at [facility] and had to be held to trend down - the patient was discharged on 1 mg daily. She has had one outpatient visit since discharge at which time the INR remained low (per pt "1.4") and the dose was increased to 1.5 mg daily.  Since the INR has remained <2 since admission in the setting of recent DVT despite higher warfarin doses - discussed with Dr. Conley Canal and have added Enox bridge until INR>2. Will give one dose this afternoon and start bid dosing on 12/28 with plans to discontinue when INR>2. Will increase warfarin dose today.  The patient was re-educated on warfarin today.  The patient also continues  on Vancomycin + Zosyn for r/o HCAP. Renal function stable - doses remain appropriate.   Goal of Therapy:  INR 2-3   Plan:  1. Warfarin 5 mg x 1 dose at 1800 today 2. Lovenox 80 mg SQ x 1 dose this afternoon then start 80 mg SQ bid on 12/28 3. Will discontinue lovenox once INR>2 4. Continue Vancomycin 1g IV every 12 hours 5. Continue Zosyn 3.375g IV every 8 hours 6. Will continue to monitor for any signs/symptoms of bleeding and will follow up with PT/INR in the a.m.  7. Will continue to follow renal function, culture results, LOT, and antibiotic de-escalation plans   [name], PharmD, BCPS Clinical Pharmacist Pager: [phone] 11/25/2014 11:16 AM

## 2014-11-25 NOTE — Progress Notes (Signed)
[facility]                                                                              Patient Demographics  Marie Tanner, is a 58 y.o. female, DOB - 1956-02-14, ZOX:[id number]  Admit date - 11/22/2014   Admitting Physician Kelvin Cellar, MD  Outpatient Primary MD for the patient is BLISS, Lynnell Jude, MD  LOS - 3   Chief Complaint  Patient presents with  . Palpitations      Brief history 58 year old female history of diabetes, hypertension, admission to [facility] for STEMI complicated by cardiac arrest, liver laceration repair, presented to the emergency department with complaints of shortness of breath cough and palpitations. Patient admitted for healthcare associated pneumonia and placed on broad-spectrum antibiotics. Trying to obtain records, however, care everywhere yielded no records from [place].  Assessment & Plan   ?Healthcare associated pneumonia -Patient currently afebrile and no leukocytosis -CT chest: Semisolid opacity in the superior segment of the right lower lobe; follow-up CT 10-12 weeks -Continue bronchodilators, vancomycin, Zosyn. Less dyspneic. Continue iv abx 24 more hours possible discharge in am  Recent STEMI status post multiple stents Echo shows EF 40%. No records yet from [place]. Weight up from admission. Tolerating ace inhibitor. Breathing improved with iv lasix.  Cardiomyopathy: need records  Recent DVT -Continue Coumadin per pharmacy  Diabetes mellitus, type II -Metformin currently held Continue SSI. hgb a1c 6.3  Hyponatremia and hypokalemia improved  Abdominal wound with wound VAC -Wound care consulted -Continue IV antibiotics  Deconditioning: pt rec outpt pt  Diarrhea: c diff negative  Code Status: Full  Family Communication: son and sister    Disposition Plan: Admitted   Time Spent in minutes   20  minutes  Procedures  None   Consults   None   DVT Prophylaxis  Coumadin   Lab Results  Component Value Date   PLT  309 11/24/2014    Medications  Scheduled Meds: . aspirin EC  81 mg Oral Daily  . atorvastatin  80 mg Oral QHS  . enoxaparin (LOVENOX) injection  80 mg Subcutaneous Once  . [START ON 11/26/2014] enoxaparin (LOVENOX) injection  80 mg Subcutaneous Q12H  . feeding supplement (ENSURE COMPLETE)  237 mL Oral Q2000  . furosemide  40 mg Intravenous BID  . HYDROcodone-acetaminophen  2 tablet Oral Once  . insulin aspart  0-9 Units Subcutaneous TID WC  . lisinopril  2.5 mg Oral Daily  . magnesium oxide  1,200 mg Oral TID  . metoprolol tartrate  12.5 mg Oral Q12H  . piperacillin-tazobactam (ZOSYN)  IV  3.375 g Intravenous 3 times per day  . potassium chloride  40 mEq Oral BID  . saccharomyces boulardii  250 mg Oral BID  . sodium chloride  3 mL Intravenous Q12H  . ticagrelor  90 mg Oral Q12H  . vancomycin  1,000 mg Intravenous Q12H  . warfarin  5 mg Oral ONCE-1800  . Warfarin - Pharmacist Dosing Inpatient   Does not apply q1800   Continuous Infusions:  PRN Meds:.acetaminophen, albuterol, HYDROcodone-acetaminophen  Antibiotics    Anti-infectives    Start     Dose/Rate Route Frequency Ordered Stop   11/22/14 2200  piperacillin-tazobactam (ZOSYN) IVPB 3.375 g     3.375 g12.5 mL/hr over 240 Minutes Intravenous 3 times per day 11/22/14 1756     11/22/14 1300  vancomycin (VANCOCIN) IVPB 1000 mg/200 mL premix     1,000 mg200 mL/hr over 60 Minutes Intravenous Every 12 hours 11/22/14 1231     11/22/14 1245  piperacillin-tazobactam (ZOSYN) IVPB 3.375 g     3.375 g100 mL/hr over 30 Minutes Intravenous  Once 11/22/14 1232 11/22/14 1350   11/22/14 1230  piperacillin-tazobactam (ZOSYN) IVPB 3.375 g  Status:  Discontinued     3.375 g12.5 mL/hr over 240 Minutes Intravenous  Once 11/22/14 1222 11/22/14 1232        Subjective:   Breathing improved. Has ambulated in hall  Objective:   Filed Vitals:   11/24/14 1915 11/24/14 2104 11/25/14 0453 11/25/14 0534  BP: 108/70 115/70 93/60 96/63   Pulse:  83 94 80 67  Temp: 98.4 F (36.9 C)  97.8 F (36.6 C)   TempSrc: Oral  Oral   Resp: 18  18   Height:      Weight:   81 kg (178 lb 9.2 oz)   SpO2: 99%  100%     Wt Readings from Last 3 Encounters:  11/25/14 81 kg (178 lb 9.2 oz)     Intake/Output Summary (Last 24 hours) at 11/25/14 1605 Last data filed at 11/25/14 1028  Gross per 24 hour  Intake      0 ml  Output   2800 ml  Net  -2800 ml    Exam  General: Well developed, well nourished, NAD, appears stated age  Cardiovascular: S1 S2 auscultated, 2/6 SEM, RRR  Respiratory: CTA without wheezes rhonchi or rales  Abdomen: Soft, nontender, nondistended, + bowel sounds, midline incision with staples. No drainage erythema. Wound VAC in place.  Extremities: warm dry without cyanosis clubbing or edema    Data Review   Micro Results Recent Results (from the past 240 hour(s))  Clostridium Difficile by PCR     Status: None   Collection Time: 11/24/14 12:25 PM  Result Value Ref Range Status   C difficile by pcr NEGATIVE NEGATIVE Final    Radiology Reports Dg Chest 2 View  11/22/2014   CLINICAL DATA:  Chest pain and mouth heart stations. Recent long hospitalization for myocardial infarction  EXAM: CHEST  2 VIEW  COMPARISON:  None.  FINDINGS: Normal mediastinum and cardiac silhouette. Normal pulmonary vasculature. No evidence of effusion, infiltrate, or pneumothorax. No acute bony abnormality.  IMPRESSION: No acute cardiopulmonary process.   Electronically Signed   By: Suzy Bouchard M.D.   On: 11/22/2014 10:53   Ct Angio Chest Pe W/cm &/or Wo Cm  11/22/2014   CLINICAL DATA:  Chest pain and shortness of breath  EXAM: CT ANGIOGRAPHY CHEST WITH CONTRAST  TECHNIQUE: Multidetector CT imaging of the chest was performed using the standard protocol during bolus administration of intravenous contrast. Multiplanar CT image reconstructions and MIPs were obtained to evaluate the vascular anatomy.  CONTRAST:  144mL OMNIPAQUE IOHEXOL 350  MG/ML SOLN  COMPARISON:  Chest radiograph November 22, 2014  FINDINGS: There is no demonstrable pulmonary embolus. There is no demonstrable thoracic aortic aneurysm or dissection.  There is patchy atelectatic change in both lower lobes  As well as in the right middle lobe. There is a semi-solid opacity in the superior segment of the right lower lobe measuring 1.1 x 1.1 cm, best seen on axial slice 38 series 6. On axial slice 42  series 6, there is a nodular opacity abutting the pleura in the superior segment of the right lower lobe measuring 3 mm.  There is extensive coronary artery calcification. Pericardium is not appreciably thickened. There is left ventricular hypertrophy.  There is no appreciable thoracic adenopathy.  In the visualized upper abdomen, there is hepatic steatosis. There is slight hypertrophy of the visualize left adrenal. There is incomplete visualization of an area of decreased attenuation near the fissure for the ligamentum teres measuring 2.5 x 1.9 cm.  There are no blastic or lytic bone lesions. There is no appreciable thyroid lesion.  Review of the MIP images confirms the above findings.  IMPRESSION: No demonstrable pulmonary embolus.  Patchy atelectasis in both lower lobes and right middle lobe.  Semi-solid opacity in the superior segment of the right lower lobe measuring 1.1 x 1.1 cm. This finding may represent a small focus of pneumonia. Atypical neoplasm can present in this manner, however, an in this regard, a followup CT in 10-12 weeks is advised to further assess. A 3 mm nodular opacity in the right lower lobe superior segment is also noted.  No demonstrable adenopathy.  Extensive coronary artery calcification. Left ventricular hypertrophy.  Hepatic steatosis. Incomplete visualization of an apparent mass near the fissure for the ligamentum teres. While this area potentially could represent focal fatty infiltration, further evaluation of this area appears warranted. Nonemergent pre and  postcontrast MR of the liver to further assess would be the optimum study of choice further assessment. If there is a contraindication to MRI, nonemergent pre and multiphasic post-contrast CT would be the alternative imaging study to further evaluate this area.   Electronically Signed   By: Lowella Grip M.D.   On: 11/22/2014 12:01    CBC  Recent Labs Lab 11/22/14 1031 11/23/14 0710 11/24/14 0310  WBC 5.3 3.8* 4.1  HGB 12.1 11.3* 10.6*  HCT 36.0 34.5* 33.3*  PLT 342 294 309  MCV 90.2 87.8 88.3  MCH 30.3 28.8 28.1  MCHC 33.6 32.8 31.8  RDW 14.8 14.8 15.0  LYMPHSABS 1.5  --   --   MONOABS 1.1*  --   --   EOSABS 0.1  --   --   BASOSABS 0.0  --   --     Chemistries   Recent Labs Lab 11/22/14 1031 11/23/14 0710 11/25/14 0554  NA 133* 136 132*  K 3.2* 3.8 3.5  CL 94* 101 99  CO2 24 26 27   GLUCOSE 112* 116* 116*  BUN 8 <5* <5*  CREATININE 0.55 0.57 0.56  CALCIUM 10.0 9.3 9.0  AST 42* 33  --   ALT 29 25  --   ALKPHOS 123* 102  --   BILITOT 1.5* 1.3*  --    ------------------------------------------------------------------------------------------------------------------ estimated creatinine clearance is 78.3 mL/min (by C-G formula based on Cr of 0.56). ------------------------------------------------------------------------------------------------------------------  Recent Labs  11/22/14 1852  HGBA1C 6.3*   ------------------------------------------------------------------------------------------------------------------ No results for input(s): CHOL, HDL, LDLCALC, TRIG, CHOLHDL, LDLDIRECT in the last 72 hours. ------------------------------------------------------------------------------------------------------------------ No results for input(s): TSH, T4TOTAL, T3FREE, THYROIDAB in the last 72 hours.  Invalid input(s): FREET3 ------------------------------------------------------------------------------------------------------------------ No results for  input(s): VITAMINB12, FOLATE, FERRITIN, TIBC, IRON, RETICCTPCT in the last 72 hours.  Coagulation profile  Recent Labs Lab 11/22/14 1031 11/23/14 0710 11/24/14 0310 11/25/14 0554  INR 1.65* 1.45 1.81* 1.82*    No results for input(s): DDIMER in the last 72 hours.  Cardiac Enzymes  Recent Labs Lab 11/23/14 0108 11/23/14 0710 11/23/14 1256  TROPONINI  0.03 <0.03 <0.03   ------------------------------------------------------------------------------------------------------------------ Invalid input(s): POCBNP    Nishi Neiswonger L MD on 11/25/2014 at 4:05 PM  Text page [email or web address] - password [facility]  [facility]

## 2014-11-26 LAB — PROTIME-INR
INR: 2.2 — AB (ref 0.00–1.49)
Prothrombin Time: 24.6 seconds — ABNORMAL HIGH (ref 11.6–15.2)

## 2014-11-26 LAB — GLUCOSE, CAPILLARY
GLUCOSE-CAPILLARY: 120 mg/dL — AB (ref 70–99)
GLUCOSE-CAPILLARY: 101 mg/dL — AB (ref 70–99)
GLUCOSE-CAPILLARY: 105 mg/dL — AB (ref 70–99)
Glucose-Capillary: 106 mg/dL — ABNORMAL HIGH (ref 70–99)

## 2014-11-26 LAB — BASIC METABOLIC PANEL
Anion gap: 7 (ref 5–15)
BUN: 6 mg/dL (ref 6–23)
CALCIUM: 9.3 mg/dL (ref 8.4–10.5)
CO2: 25 mmol/L (ref 19–32)
CREATININE: 0.85 mg/dL (ref 0.50–1.10)
Chloride: 100 mEq/L (ref 96–112)
GFR calc Af Amer: 87 mL/min — ABNORMAL LOW (ref >90)
GFR calc non Af Amer: 75 mL/min — ABNORMAL LOW (ref >90)
Glucose, Bld: 131 mg/dL — ABNORMAL HIGH (ref 70–99)
Potassium: 3.6 mmol/L (ref 3.5–5.1)
Sodium: 132 mmol/L — ABNORMAL LOW (ref 135–145)

## 2014-11-26 MED ORDER — LOPERAMIDE HCL 2 MG PO CAPS
4.0000 mg | ORAL_CAPSULE | Freq: Once | ORAL | Status: DC
  Filled 2014-11-26: qty 2

## 2014-11-26 MED ORDER — LISINOPRIL 2.5 MG PO TABS: 2.5000 mg | ORAL_TABLET | Freq: Every day | ORAL | Status: DC

## 2014-11-26 MED ORDER — LEVOFLOXACIN 500 MG PO TABS: 500.0000 mg | ORAL_TABLET | Freq: Every day | ORAL | Status: DC

## 2014-11-26 MED ORDER — LOPERAMIDE HCL 2 MG PO CAPS: ORAL_CAPSULE | ORAL | Status: DC

## 2014-11-26 MED ORDER — WARFARIN SODIUM 2 MG PO TABS
2.0000 mg | ORAL_TABLET | Freq: Once | ORAL | Status: AC
  Administered 2014-11-26: 2 mg via ORAL
  Filled 2014-11-26: qty 1

## 2014-11-26 MED ORDER — FUROSEMIDE 80 MG PO TABS: 80.0000 mg | ORAL_TABLET | Freq: Two times a day (BID) | ORAL | Status: DC

## 2014-11-26 NOTE — Progress Notes (Signed)
ANTICOAGULATION CONSULT NOTE - Follow Up Consult  Pharmacy Consult for Lovenox (while INR <2) Indication: recent DVT  Labs:  Recent Labs  11/23/14 0710 11/23/14 1256 11/24/14 0310 11/25/14 0554 11/26/14 0432  HGB 11.3*  --  10.6*  --   --   HCT 34.5*  --  33.3*  --   --   PLT 294  --  309  --   --   LABPROT 17.8*  --  21.1* 21.3* 24.6*  INR 1.45  --  1.81* 1.82* 2.20*  CREATININE 0.57  --   --  0.56 0.85  TROPONINI <0.03 <0.03  --   --   --    Assessment: INR >2   Plan:  -DC Lovenox   Narda Bonds 11/26/2014,5:44 AM

## 2014-11-26 NOTE — Care Management Note (Signed)
    Page 1 of 1   11/26/2014     12:19:38 PM CARE MANAGEMENT NOTE 11/26/2014  Patient:  MONI, ROTHROCK   Account Number:  [id number]  Date Initiated:  11/26/2014  Documentation initiated by:  [facility]  Subjective/Objective Assessment:   Admitted with SOB and palpitations.     Action/Plan:   Anticipated DC Date:  11/26/2014   Anticipated DC Plan:  [place]  CM consult      [facility] Choice  Resumption Of Svcs/PTA Provider   Choice offered to / List presented to:          [facility] arranged  HH-1 RN  HH-2 PT  HH-3 OT  HH-4 NURSE'S AIDE      Status of service:  Completed, signed off Medicare Important Message given?   (If response is "NO", the following Medicare IM given date fields will be blank) Date Medicare IM given:   Medicare IM given by:   Date Additional Medicare IM given:   Additional Medicare IM given by:    Discharge Disposition:    Per UR Regulation:    If discussed at Long Length of Stay Meetings, dates discussed:    Comments:  11-26-14 12:15pm Luz Lex, RNBSN - [phone] Talked with Luellen Pucker from [facility].  States patient was active with them prior to admission for [facility] RN, PT, OT and na.  Asked for resumption of orders.  Notified physician.

## 2014-11-26 NOTE — Progress Notes (Addendum)
ANTICOAGULATION CONSULT NOTE - Follow Up Consult  Pharmacy Consult for Warfarin Indication: Hx recent DVT   Allergies  Allergen Reactions  . Epinephrine Other (See Comments)    Severe anxiety  . Lactose Intolerance (Gi) Diarrhea    Patient Measurements: Height: 5' 2.5" (158.8 cm) Weight: 177 lb 11.1 oz (80.6 kg) IBW/kg (Calculated) : 51.25  Vital Signs: Temp: 97.9 F (36.6 C) (12/28 0401) Temp Source: Oral (12/28 0401) BP: 112/70 mmHg (12/28 0401) Pulse Rate: 85 (12/28 0401)  Labs:  Recent Labs  11/23/14 1256 11/24/14 0310 11/25/14 0554 11/26/14 0432  HGB  --  10.6*  --   --   HCT  --  33.3*  --   --   PLT  --  309  --   --   LABPROT  --  21.1* 21.3* 24.6*  INR  --  1.81* 1.82* 2.20*  CREATININE  --   --  0.56 0.85  TROPONINI <0.03  --   --   --     Estimated Creatinine Clearance: 73.5 mL/min (by C-G formula based on Cr of 0.85).  Assessment: 18 YOF who continues on warfarin from PTA for hx recent DVT (Nov '15) with a therapeutic INR this morning (INR 2.2<<1.82 << 1.81, goal of 2-3). No bleeding noted.  Discussed PTA warfarin dose with patient. The patient stated that the INR rose rapidly while at [facility] and had to be held to trend down - the patient was discharged on 1 mg daily. She has had one outpatient visit since discharge at which time the INR remained low (per pt "1.4") and the dose was increased to 1.5 mg daily.  She has received much higher Coumadin doses this admission than what she was prescribed as an outpatient (average of 3.75mg /day).  Given her history of Coumadin sensitivity I will decrease her Coumadin dose today.  Goal of Therapy:  INR 2-3   Plan:  Decrease Warfarin to 2 mg x 1 dose at 1800 today Will continue to monitor for any signs/symptoms of bleeding and will follow up with PT/INR in the a.m.  If she is discharged today, recommend Coumadin 2mg  daily with a PT/INR check on Wednesday 12/30.  Legrand Como, Pharm.D., BCPS, AAHIVP Clinical  Pharmacist Phone: [phone] or [phone] 11/26/2014, 10:29 AM

## 2014-11-26 NOTE — Progress Notes (Signed)
Pt. Refused Imodium states "Do not have diarreha". Cato Mulligan RN

## 2014-11-26 NOTE — Discharge Summary (Signed)
Physician Discharge Summary  Marie Tanner PPI:[id number] DOB: [birth year] DOA: 11/22/2014  PCP: Lynnell Jude, MD  Admit date: 11/22/2014 Discharge date: 11/26/2014  Time spent: greater than 30 minutes  Recommendations for Outpatient Follow-up:  1. Need follow up CT 10-12 weeks 2. Continue home health RN, PT 3. Monitor BMET on ace inhibitor  Discharge Diagnoses:  Principal Problem:   HCAP (healthcare-associated pneumonia) Active Problems:   Dyspnea   Cardiomyopathy   DVT (deep venous thrombosis)   DM type 2 (diabetes mellitus, type 2)   Discharge Condition: stable  Filed Weights   11/24/14 1807 11/25/14 0453 11/26/14 0401  Weight: 81.92 kg (180 lb 9.6 oz) 81 kg (178 lb 9.2 oz) 80.6 kg (177 lb 11.1 oz)    History of present illness:  58 y.o. female with PMH of DM, HTN, recently admitted to [facility] with STEMI, s/p cardiac arrest CPR complicated with liver laceration/repair; s/p PCI multiple stents on brilinta, new DVT on coumadin presented with SOB, non productive cough, associated with palpitations, and URI; patient also reports SOB, DOE, but no significant chest pains; no nausea, vomiting, has some mild diarrhea  - initial CXR: unremarkable, but f/u CT showed early pneumonia;   Hospital Course:   Healthcare associated pneumonia Started on vancomycin and zosyn.  -CT chest: Semisolid opacity in the superior segment of the right lower lobe; follow-up CT 10-12 weeks Improved cough, dyspnea strength by discharge.  Recent STEMI status post multiple stents/cardiomyopathy Echo shows EF 40%. Requested records from [facility] without success. Weight increased from admission. Lasix changed to iv bid for several days with improvement in dyspnea. Started on ace inhibitor.   Recent DVT Coumadin continued, dosed by pharmacy  Diabetes mellitus, type II Remained controlled. hgb a1c 6.3  Hyponatremia and hypokalemia corrected  Abdominal wound with wound VAC -Wound care  consulted  Diarrhea: c diff negative. May be related to magnesium supplements.  Procedures:  none  Consultations:  none  Discharge Exam: Filed Vitals:   11/26/14 0401  BP: 112/70  Pulse: 85  Temp: 97.9 F (36.6 C)  Resp: 16    General: comfortable Cardiovascular: RRR Respiratory: CTA Abd: incisions ok Ext no CCE  Discharge Instructions   Discharge Instructions    Activity as tolerated - No restrictions    Complete by:  As directed      Diet - low sodium heart healthy    Complete by:  As directed      Diet Carb Modified    Complete by:  As directed           Current Discharge Medication List    START taking these medications   Details  levofloxacin (LEVAQUIN) 500 MG tablet Take 1 tablet (500 mg total) by mouth daily. Qty: 3 tablet, Refills: 0    lisinopril (PRINIVIL,ZESTRIL) 2.5 MG tablet Take 1 tablet (2.5 mg total) by mouth daily. Qty: 30 tablet, Refills: 0    loperamide (IMODIUM) 2 MG capsule Take as directed for diarrhea Qty: 30 capsule, Refills: 0      CONTINUE these medications which have CHANGED   Details  furosemide (LASIX) 80 MG tablet Take 1 tablet (80 mg total) by mouth 2 (two) times daily. Qty: 60 tablet, Refills: 0      CONTINUE these medications which have NOT CHANGED   Details  acetaminophen (TYLENOL) 500 MG tablet Take 500 mg by mouth 2 (two) times daily as needed (pain).    aspirin EC 81 MG tablet Take 81 mg by mouth daily.  atorvastatin (LIPITOR) 80 MG tablet Take 80 mg by mouth at bedtime.     Lactobacillus-Inulin ([place]) CAPS Take 1 capsule by mouth daily.    magnesium oxide (MAG-OX) 400 MG tablet Take 1,200 mg by mouth 3 (three) times daily.     metoprolol tartrate (LOPRESSOR) 25 MG tablet Take 12.5 mg by mouth every 12 (twelve) hours.     potassium chloride SA (K-DUR,KLOR-CON) 20 MEQ tablet Take 40 mEq by mouth daily.    ticagrelor (BRILINTA) 90 MG TABS tablet Take 90 mg by mouth every 12  (twelve) hours.     warfarin (COUMADIN) 1 MG tablet Take 1.5 mg by mouth at bedtime.     metFORMIN (GLUCOPHAGE) 500 MG tablet Take 500 mg by mouth daily with breakfast.       STOP taking these medications     clindamycin (CLEOCIN) 300 MG capsule        Allergies  Allergen Reactions  . Epinephrine Other (See Comments)    Severe anxiety  . Lactose Intolerance (Gi) Diarrhea   Follow-up Information    Follow up with your cardiologist In 1 week.   Why:  to check potassium level, kidney function and fluid status       The results of significant diagnostics from this hospitalization (including imaging, microbiology, ancillary and laboratory) are listed below for reference.    Significant Diagnostic Studies: Dg Chest 2 View  11/22/2014   CLINICAL DATA:  Chest pain and mouth heart stations. Recent long hospitalization for myocardial infarction  EXAM: CHEST  2 VIEW  COMPARISON:  None.  FINDINGS: Normal mediastinum and cardiac silhouette. Normal pulmonary vasculature. No evidence of effusion, infiltrate, or pneumothorax. No acute bony abnormality.  IMPRESSION: No acute cardiopulmonary process.   Electronically Signed   By: Suzy Bouchard M.D.   On: 11/22/2014 10:53   Ct Angio Chest Pe W/cm &/or Wo Cm  11/22/2014   CLINICAL DATA:  Chest pain and shortness of breath  EXAM: CT ANGIOGRAPHY CHEST WITH CONTRAST  TECHNIQUE: Multidetector CT imaging of the chest was performed using the standard protocol during bolus administration of intravenous contrast. Multiplanar CT image reconstructions and MIPs were obtained to evaluate the vascular anatomy.  CONTRAST:  132mL OMNIPAQUE IOHEXOL 350 MG/ML SOLN  COMPARISON:  Chest radiograph November 22, 2014  FINDINGS: There is no demonstrable pulmonary embolus. There is no demonstrable thoracic aortic aneurysm or dissection.  There is patchy atelectatic change in both lower lobes  As well as in the right middle lobe. There is a semi-solid opacity in the  superior segment of the right lower lobe measuring 1.1 x 1.1 cm, best seen on axial slice 38 series 6. On axial slice 42 series 6, there is a nodular opacity abutting the pleura in the superior segment of the right lower lobe measuring 3 mm.  There is extensive coronary artery calcification. Pericardium is not appreciably thickened. There is left ventricular hypertrophy.  There is no appreciable thoracic adenopathy.  In the visualized upper abdomen, there is hepatic steatosis. There is slight hypertrophy of the visualize left adrenal. There is incomplete visualization of an area of decreased attenuation near the fissure for the ligamentum teres measuring 2.5 x 1.9 cm.  There are no blastic or lytic bone lesions. There is no appreciable thyroid lesion.  Review of the MIP images confirms the above findings.  IMPRESSION: No demonstrable pulmonary embolus.  Patchy atelectasis in both lower lobes and right middle lobe.  Semi-solid opacity in the superior segment of  the right lower lobe measuring 1.1 x 1.1 cm. This finding may represent a small focus of pneumonia. Atypical neoplasm can present in this manner, however, an in this regard, a followup CT in 10-12 weeks is advised to further assess. A 3 mm nodular opacity in the right lower lobe superior segment is also noted.  No demonstrable adenopathy.  Extensive coronary artery calcification. Left ventricular hypertrophy.  Hepatic steatosis. Incomplete visualization of an apparent mass near the fissure for the ligamentum teres. While this area potentially could represent focal fatty infiltration, further evaluation of this area appears warranted. Nonemergent pre and postcontrast MR of the liver to further assess would be the optimum study of choice further assessment. If there is a contraindication to MRI, nonemergent pre and multiphasic post-contrast CT would be the alternative imaging study to further evaluate this area.   Electronically Signed   By: Lowella Grip  M.D.   On: 11/22/2014 12:01   ekg Sinus tachycardia Possible Inferior infarct , age undetermined Cannot rule out Anterior infarct , age undetermined  Echo Left ventricle: Moderate hypokinesis base/mid inferoseptal segments. Moderate hypokinesis base/mid inferior segments. Mild hypokinesis base/mid inferolaeral segments. EF 40%. The cavity size was normal. Wall thickness was increased in a pattern of mild LVH. - Mitral valve: Mild/moderate turbulent MR anteriorly directed. I can&'t rule out prolapse of part of the posterior leaflet. - Right ventricle: The cavity size was normal. Systolic function was mildly reduced.  Microbiology: Recent Results (from the past 240 hour(s))  Clostridium Difficile by PCR     Status: None   Collection Time: 11/24/14 12:25 PM  Result Value Ref Range Status   C difficile by pcr NEGATIVE NEGATIVE Final     Labs: Basic Metabolic Panel:  Recent Labs Lab 11/22/14 1031 11/23/14 0710 11/25/14 0554 11/26/14 0432  NA 133* 136 132* 132*  K 3.2* 3.8 3.5 3.6  CL 94* 101 99 100  CO2 24 26 27 25   GLUCOSE 112* 116* 116* 131*  BUN 8 <5* <5* 6  CREATININE 0.55 0.57 0.56 0.85  CALCIUM 10.0 9.3 9.0 9.3   Liver Function Tests:  Recent Labs Lab 11/22/14 1031 11/23/14 0710  AST 42* 33  ALT 29 25  ALKPHOS 123* 102  BILITOT 1.5* 1.3*  PROT 8.4* 6.9  ALBUMIN 3.8 3.1*   No results for input(s): LIPASE, AMYLASE in the last 168 hours. No results for input(s): AMMONIA in the last 168 hours. CBC:  Recent Labs Lab 11/22/14 1031 11/23/14 0710 11/24/14 0310  WBC 5.3 3.8* 4.1  NEUTROABS 2.6  --   --   HGB 12.1 11.3* 10.6*  HCT 36.0 34.5* 33.3*  MCV 90.2 87.8 88.3  PLT 342 294 309   Cardiac Enzymes:  Recent Labs Lab 11/22/14 1031 11/23/14 0108 11/23/14 0710 11/23/14 1256  TROPONINI 0.03 0.03 <0.03 <0.03   BNP: BNP (last 3 results) No results for input(s): PROBNP in the last 8760 hours. CBG:  Recent Labs Lab 11/25/14 1108  11/25/14 1635 11/25/14 2313 11/26/14 0635 11/26/14 1102  GLUCAP 87 99 106* 120* 101*       Signed:  Lauralynn Loeb L  [facility] 11/26/2014, 11:57 AM

## 2015-01-16 ENCOUNTER — Emergency Department (HOSPITAL_BASED_OUTPATIENT_CLINIC_OR_DEPARTMENT_OTHER): Payer: BC Managed Care – PPO

## 2015-01-16 ENCOUNTER — Encounter (HOSPITAL_BASED_OUTPATIENT_CLINIC_OR_DEPARTMENT_OTHER): Payer: Self-pay | Admitting: *Deleted

## 2015-01-16 ENCOUNTER — Emergency Department (HOSPITAL_BASED_OUTPATIENT_CLINIC_OR_DEPARTMENT_OTHER)
Admission: EM | Admit: 2015-01-16 | Discharge: 2015-01-17 | Disposition: A | Discharge status: Home / Self Care | Payer: BC Managed Care – PPO | Attending: Emergency Medicine | Admitting: Emergency Medicine

## 2015-01-16 ENCOUNTER — Other Ambulatory Visit: Payer: Self-pay

## 2015-01-16 DIAGNOSIS — R079 Chest pain, unspecified: Secondary | ICD-10-CM | POA: Diagnosis present

## 2015-01-16 DIAGNOSIS — R0600 Dyspnea, unspecified: Secondary | ICD-10-CM

## 2015-01-16 DIAGNOSIS — I10 Essential (primary) hypertension: Secondary | ICD-10-CM | POA: Insufficient documentation

## 2015-01-16 DIAGNOSIS — E119 Type 2 diabetes mellitus without complications: Secondary | ICD-10-CM | POA: Insufficient documentation

## 2015-01-16 LAB — CBC WITH DIFFERENTIAL/PLATELET
BASOS PCT: 0 % (ref 0–1)
Basophils Absolute: 0 10*3/uL (ref 0.0–0.1)
EOS PCT: 2 % (ref 0–5)
Eosinophils Absolute: 0.1 10*3/uL (ref 0.0–0.7)
HEMATOCRIT: 34.3 % — AB (ref 36.0–46.0)
Hemoglobin: 11.2 g/dL — ABNORMAL LOW (ref 12.0–15.0)
Lymphocytes Relative: 28 % (ref 12–46)
Lymphs Abs: 1.3 10*3/uL (ref 0.7–4.0)
MCH: 27.7 pg (ref 26.0–34.0)
MCHC: 32.7 g/dL (ref 30.0–36.0)
MCV: 84.9 fL (ref 78.0–100.0)
Monocytes Absolute: 0.7 10*3/uL (ref 0.1–1.0)
Monocytes Relative: 15 % — ABNORMAL HIGH (ref 3–12)
Neutro Abs: 2.5 10*3/uL (ref 1.7–7.7)
Neutrophils Relative %: 55 % (ref 43–77)
Platelets: 342 10*3/uL (ref 150–400)
RBC: 4.04 MIL/uL (ref 3.87–5.11)
RDW: 14.1 % (ref 11.5–15.5)
WBC: 4.6 10*3/uL (ref 4.0–10.5)

## 2015-01-16 LAB — COMPREHENSIVE METABOLIC PANEL
ALT: 22 U/L (ref 0–35)
AST: 33 U/L (ref 0–37)
Albumin: 3.9 g/dL (ref 3.5–5.2)
Alkaline Phosphatase: 85 U/L (ref 39–117)
Anion gap: 7 (ref 5–15)
BUN: 13 mg/dL (ref 6–23)
CHLORIDE: 99 mmol/L (ref 96–112)
CO2: 26 mmol/L (ref 19–32)
Calcium: 9 mg/dL (ref 8.4–10.5)
Creatinine, Ser: 0.58 mg/dL (ref 0.50–1.10)
GFR calc Af Amer: >90 mL/min (ref >90)
GFR calc non Af Amer: >90 mL/min (ref >90)
Glucose, Bld: 107 mg/dL — ABNORMAL HIGH (ref 70–99)
Potassium: 3.3 mmol/L — ABNORMAL LOW (ref 3.5–5.1)
Sodium: 132 mmol/L — ABNORMAL LOW (ref 135–145)
TOTAL PROTEIN: 9.1 g/dL — AB (ref 6.0–8.3)
Total Bilirubin: 1.1 mg/dL (ref 0.3–1.2)

## 2015-01-16 LAB — PROTIME-INR
INR: 1.28 (ref 0.00–1.49)
PROTHROMBIN TIME: 16 s — AB (ref 11.6–15.2)

## 2015-01-16 LAB — TROPONIN I: Troponin I: <0.03 ng/mL (ref <0.031)

## 2015-01-16 NOTE — ED Provider Notes (Signed)
CSN: [id number]     Arrival date & time 01/16/15  2042 History   First MD Initiated Contact with Patient 01/16/15 2234     Chief Complaint  Patient presents with  . Chest Pain     (Consider location/radiation/quality/duration/timing/severity/associated sxs/prior Treatment) HPI Patient reports that she has had chest pain that has been coming and going today. It's on the left side and has been sharp in nature. She reports that when she is getting these pains she also feels dizzy and short of breath. The patient notes that the pain is also radiating through to her back intermittently. Patient denies that she's had a fever or a cough leading up to the symptoms. The patient has a known DVT. She reports she's never had a pulmonary embolus with it before. She reports that her INR was checked today and was "low". The patient was seen by her family doctor and an EKG was done. She reports at that time they did not think this was due to her heart condition however she continues to experience chest pain and shortness of breath. Past Medical History  Diagnosis Date  . Renal disorder   . Coronary artery disease   . Liver laceration 10/19/14  . Pulmonary edema 10/19/14  . DVT (deep venous thrombosis) 11/15    left femerol artery injury during resusciation.  [name] STEMI (ST elevation myocardial infarction) 10/19/14  . Hypertension   . High cholesterol   . Heart murmur   . HCAP (healthcare-associated pneumonia) 11/22/2014  . Sleep apnea     "suppose to wear mask but I don't" (11/22/2014)  . Type II diabetes mellitus   . History of blood transfusion 09/2014    related to MI  . GERD (gastroesophageal reflux disease)   . History of gout    Past Surgical History  Procedure Laterality Date  . Cesarean section  1980; 1982  . Laparoscopic abdominal exploration    . Liver laceration repair  09/2014    related to CPR/notes 11/22/2014  . Tracheostomy  09/2014    "closed on it's own when they took it out"   . Femerol artery repair Right 09/2014    pt insists it was a right femoral artery repair on 11/22/2014  . Extracorporeal circulation    . Left ventricular assist device      pt is not aware of this hx on 11/22/2014  . Abdominal hysterectomy  ~ 2000  . Tubal ligation  1982  . Coronary angioplasty with stent placement  09/2014    "multiple"/notes 11/22/2014  . Application of wound vac  10/2014    "over my naval" (11/22/2014)   No family history on file. History  Substance Use Topics  . Smoking status: Never Smoker   . Smokeless tobacco: Never Used  . Alcohol Use: No   OB History    Gravida Para Term Preterm AB TAB SAB Ectopic Multiple Living   2 2             Review of Systems  10 Systems reviewed and are negative for acute change except as noted in the HPI.   Allergies  Epinephrine and Lactose intolerance (gi)  Home Medications   Prior to Admission medications   Medication Sig Start Date End Date Taking? Authorizing Provider  acetaminophen (TYLENOL) 500 MG tablet Take 500 mg by mouth 2 (two) times daily as needed (pain).    Historical Provider, MD  aspirin EC 81 MG tablet Take 81 mg by mouth daily.  Historical Provider, MD  atorvastatin (LIPITOR) 80 MG tablet Take 80 mg by mouth at bedtime.     Historical Provider, MD  furosemide (LASIX) 80 MG tablet Take 1 tablet (80 mg total) by mouth 2 (two) times daily. 11/26/14   Delfina Redwood, MD  Lactobacillus-Inulin ([place]) CAPS Take 1 capsule by mouth daily.    Historical Provider, MD  levofloxacin (LEVAQUIN) 500 MG tablet Take 1 tablet (500 mg total) by mouth daily. 11/26/14   Delfina Redwood, MD  lisinopril (PRINIVIL,ZESTRIL) 2.5 MG tablet Take 1 tablet (2.5 mg total) by mouth daily. 11/26/14   Delfina Redwood, MD  loperamide (IMODIUM) 2 MG capsule Take as directed for diarrhea 11/26/14   Delfina Redwood, MD  magnesium oxide (MAG-OX) 400 MG tablet Take 1,200 mg by mouth 3 (three) times  daily.     Historical Provider, MD  metFORMIN (GLUCOPHAGE) 500 MG tablet Take 500 mg by mouth daily with breakfast.     Historical Provider, MD  metoprolol tartrate (LOPRESSOR) 25 MG tablet Take 12.5 mg by mouth every 12 (twelve) hours.     Historical Provider, MD  potassium chloride SA (K-DUR,KLOR-CON) 20 MEQ tablet Take 40 mEq by mouth daily.    Historical Provider, MD  ticagrelor (BRILINTA) 90 MG TABS tablet Take 90 mg by mouth every 12 (twelve) hours.     Historical Provider, MD  warfarin (COUMADIN) 1 MG tablet Take 1.5 mg by mouth at bedtime.     Historical Provider, MD   BP 115/72 mmHg  Pulse 86  Temp(Src) 98.3 F (36.8 C) (Oral)  Resp 18  Ht 5' 2.5" (1.588 m)  Wt 165 lb (74.844 kg)  BMI 29.68 kg/m2  SpO2 100% Physical Exam  Constitutional: She is oriented to person, place, and time. She appears well-developed and well-nourished.  HENT:  Head: Normocephalic and atraumatic.  Eyes: EOM are normal. Pupils are equal, round, and reactive to light.  Neck: Neck supple.  Cardiovascular: Normal rate, regular rhythm, normal heart sounds and intact distal pulses.   Pulmonary/Chest: Effort normal and breath sounds normal.  Abdominal: Soft. Bowel sounds are normal. She exhibits no distension. There is no tenderness.  Musculoskeletal: Normal range of motion. She exhibits no edema.  Neurological: She is alert and oriented to person, place, and time. She has normal strength. Coordination normal. GCS eye subscore is 4. GCS verbal subscore is 5. GCS motor subscore is 6.  Skin: Skin is warm, dry and intact.  Psychiatric: She has a normal mood and affect.    ED Course  Procedures (including critical care time) Labs Review Labs Reviewed  CBC WITH DIFFERENTIAL/PLATELET - Abnormal; Notable for the following:    Hemoglobin 11.2 (*)    HCT 34.3 (*)    Monocytes Relative 15 (*)    All other components within normal limits  PROTIME-INR - Abnormal; Notable for the following:    Prothrombin Time  16.0 (*)    All other components within normal limits  COMPREHENSIVE METABOLIC PANEL - Abnormal; Notable for the following:    Sodium 132 (*)    Potassium 3.3 (*)    Glucose, Bld 107 (*)    Total Protein 9.1 (*)    All other components within normal limits  TROPONIN I    Imaging Review Dg Chest 2 View  01/16/2015   CLINICAL DATA:  Left-sided chest pain and dyspnea, beginning earlier this morning.  EXAM: CHEST  2 VIEW  COMPARISON:  11/22/2014  FINDINGS: There is unchanged  curvilinear scarring in the right middle lobe. The lungs are otherwise clear. Hilar, mediastinal and cardiac contours appear unremarkable and unchanged. There are no pleural effusions.  IMPRESSION: No active cardiopulmonary disease.   Electronically Signed   By: Andreas Newport M.D.   On: 01/16/2015 21:58     EKG Interpretation None      MDM   Final diagnoses:  Chest pain, unspecified chest pain type  Dyspnea   At this time the CT scanner is down. The patient presented with symptoms that are concerning for pulmonary embolus with known prior DVT and subtherapeutic INR. She denies that she has been experiencing chest pain since her heart attack at the end of December. This chest pain started acutely and has associated shortness of breath and pleuritic-type radiation. At this time the patient will be transferred to [facility] for CT PE study. My impression is that with repeat troponin negative, this is of lower probability to be cardiac ischemic in nature. If the patient remains otherwise stable and PE study is negative she is likely appropriate for discharge with continued outpatient management.    Charlesetta Shanks, MD 01/16/15 [phone]

## 2015-01-16 NOTE — ED Notes (Signed)
Pt c/o central to left chest aching that began earlier today. She sts that she has had some SOB, dizziness, nausea and back pain.

## 2015-01-16 NOTE — ED Notes (Signed)
Pt c/o cp, some sob and dizziness x few seconds   Was seen earlier today for same

## 2015-01-17 ENCOUNTER — Encounter (HOSPITAL_COMMUNITY): Payer: Self-pay | Admitting: Radiology

## 2015-01-17 ENCOUNTER — Emergency Department (HOSPITAL_COMMUNITY): Payer: BC Managed Care – PPO

## 2015-01-17 LAB — CBG MONITORING, ED: GLUCOSE-CAPILLARY: 100 mg/dL — AB (ref 70–99)

## 2015-01-17 LAB — I-STAT TROPONIN, ED: Troponin i, poc: 0.02 ng/mL (ref 0.00–0.08)

## 2015-01-17 MED ORDER — IOHEXOL 350 MG/ML SOLN
80.0000 mL | Freq: Once | INTRAVENOUS | Status: AC | PRN
  Administered 2015-01-17: 80 mL via INTRAVENOUS

## 2015-01-17 MED ORDER — WARFARIN SODIUM 1 MG PO TABS
1.5000 mg | ORAL_TABLET | Freq: Once | ORAL | Status: AC
  Administered 2015-01-17: 1.5 mg via ORAL
  Filled 2015-01-17: qty 1

## 2015-01-17 MED ORDER — TICAGRELOR 90 MG PO TABS
90.0000 mg | ORAL_TABLET | Freq: Once | ORAL | Status: AC
  Administered 2015-01-17: 90 mg via ORAL
  Filled 2015-01-17: qty 1

## 2015-01-17 MED ORDER — SODIUM CHLORIDE 0.9 % IV BOLUS (SEPSIS)
1000.0000 mL | Freq: Once | INTRAVENOUS | Status: AC
  Administered 2015-01-17: 1000 mL via INTRAVENOUS

## 2015-01-17 MED ORDER — POTASSIUM CHLORIDE CRYS ER 20 MEQ PO TBCR
40.0000 meq | EXTENDED_RELEASE_TABLET | Freq: Once | ORAL | Status: AC
  Administered 2015-01-17: 40 meq via ORAL
  Filled 2015-01-17: qty 2

## 2015-01-17 MED ORDER — WARFARIN - PHYSICIAN DOSING INPATIENT: Freq: Every day | Status: DC

## 2015-01-17 NOTE — ED Notes (Signed)
Patient transported to CT 

## 2015-01-17 NOTE — ED Provider Notes (Signed)
CSN: [id number]     Arrival date & time 01/16/15  2042 History  This chart was scribed for Marie Balls, MD by Rayfield Citizen, ED Scribe. This patient was seen in room A08C/A08C and the patient's care was started at 12:46 AM.    Chief Complaint  Patient presents with  . Chest Pain   Patient is a 59 y.o. female presenting with chest pain. The history is provided by the patient. No language interpreter was used.  Chest Pain Associated symptoms: cough, diaphoresis and shortness of breath      HPI Comments: Marie Tanner is a 59 y.o. female with past medical history of STEMI, HTN, HLD, DM II, DVT  who presents to the Emergency Department complaining of "sharp" intermittent left-sided chest pain beginning today, radiating into her left arm. She reports SOB both with and without chest pain, as well as diaphoresis with the first episode of pain today. She denies pain at present. She cannot specify any conditions that induce or alleviate her symptoms.   She reports a recent mild cough; she denies increased pain with deep breathing. She denies history of PE.   She is currently on Coumadin; dosage increased today. She has been taking her medications as prescribed. She is here from [facility]; CT is currently down at that location to eval for PE.   Past Medical History  Diagnosis Date  . Renal disorder   . Coronary artery disease   . Liver laceration 10/19/14  . Pulmonary edema 10/19/14  . DVT (deep venous thrombosis) 11/15    left femerol artery injury during resusciation.  [name] STEMI (ST elevation myocardial infarction) 10/19/14  . Hypertension   . High cholesterol   . Heart murmur   . HCAP (healthcare-associated pneumonia) 11/22/2014  . Sleep apnea     "suppose to wear mask but I don't" (11/22/2014)  . Type II diabetes mellitus   . History of blood transfusion 09/2014    related to MI  . GERD (gastroesophageal reflux disease)   . History of gout    Past Surgical History   Procedure Laterality Date  . Cesarean section  1980; 1982  . Laparoscopic abdominal exploration    . Liver laceration repair  09/2014    related to CPR/notes 11/22/2014  . Tracheostomy  09/2014    "closed on it's own when they took it out"  . Femerol artery repair Right 09/2014    pt insists it was a right femoral artery repair on 11/22/2014  . Extracorporeal circulation    . Left ventricular assist device      pt is not aware of this hx on 11/22/2014  . Abdominal hysterectomy  ~ 2000  . Tubal ligation  1982  . Coronary angioplasty with stent placement  09/2014    "multiple"/notes 11/22/2014  . Application of wound vac  10/2014    "over my naval" (11/22/2014)   No family history on file. History  Substance Use Topics  . Smoking status: Never Smoker   . Smokeless tobacco: Never Used  . Alcohol Use: No   OB History    Gravida Para Term Preterm AB TAB SAB Ectopic Multiple Living   2 2             Review of Systems  Constitutional: Positive for diaphoresis.  Respiratory: Positive for cough and shortness of breath.   Cardiovascular: Positive for chest pain.  All other systems reviewed and are negative.  Allergies  Epinephrine and Lactose intolerance (  gi)  Home Medications   Prior to Admission medications   Medication Sig Start Date End Date Taking? Authorizing Provider  acetaminophen (TYLENOL) 500 MG tablet Take 500 mg by mouth 2 (two) times daily as needed (pain).    Historical Provider, MD  aspirin EC 81 MG tablet Take 81 mg by mouth daily.    Historical Provider, MD  atorvastatin (LIPITOR) 80 MG tablet Take 80 mg by mouth at bedtime.     Historical Provider, MD  furosemide (LASIX) 80 MG tablet Take 1 tablet (80 mg total) by mouth 2 (two) times daily. 11/26/14   Delfina Redwood, MD  Lactobacillus-Inulin ([place]) CAPS Take 1 capsule by mouth daily.    Historical Provider, MD  levofloxacin (LEVAQUIN) 500 MG tablet Take 1 tablet (500 mg total) by  mouth daily. 11/26/14   Delfina Redwood, MD  lisinopril (PRINIVIL,ZESTRIL) 2.5 MG tablet Take 1 tablet (2.5 mg total) by mouth daily. 11/26/14   Delfina Redwood, MD  loperamide (IMODIUM) 2 MG capsule Take as directed for diarrhea 11/26/14   Delfina Redwood, MD  magnesium oxide (MAG-OX) 400 MG tablet Take 1,200 mg by mouth 3 (three) times daily.     Historical Provider, MD  metFORMIN (GLUCOPHAGE) 500 MG tablet Take 500 mg by mouth daily with breakfast.     Historical Provider, MD  metoprolol tartrate (LOPRESSOR) 25 MG tablet Take 12.5 mg by mouth every 12 (twelve) hours.     Historical Provider, MD  potassium chloride SA (K-DUR,KLOR-CON) 20 MEQ tablet Take 40 mEq by mouth daily.    Historical Provider, MD  ticagrelor (BRILINTA) 90 MG TABS tablet Take 90 mg by mouth every 12 (twelve) hours.     Historical Provider, MD  warfarin (COUMADIN) 1 MG tablet Take 1.5 mg by mouth at bedtime.     Historical Provider, MD   BP 125/78 mmHg  Pulse 88  Temp(Src) 98.8 F (37.1 C) (Oral)  Resp 20  Ht 5' 2.5" (1.588 m)  Wt 165 lb (74.844 kg)  BMI 29.68 kg/m2  SpO2 100% Physical Exam  Constitutional: She is oriented to person, place, and time. She appears well-developed and well-nourished. No distress.  HENT:  Head: Normocephalic and atraumatic.  Nose: Nose normal.  Mouth/Throat: Oropharynx is clear and moist. No oropharyngeal exudate.  Eyes: Conjunctivae and EOM are normal. Pupils are equal, round, and reactive to light. No scleral icterus.  Neck: Normal range of motion. Neck supple. No JVD present. No tracheal deviation present. No thyromegaly present.  Cardiovascular: Normal rate, regular rhythm and normal heart sounds.  Exam reveals no gallop and no friction rub.   No murmur heard. Pulmonary/Chest: Effort normal and breath sounds normal. No respiratory distress. She has no wheezes. She exhibits no tenderness.  Abdominal: Soft. Bowel sounds are normal. She exhibits no distension and no mass.  There is no tenderness. There is no rebound and no guarding.  Musculoskeletal: Normal range of motion. She exhibits no edema or tenderness.  Lymphadenopathy:    She has no cervical adenopathy.  Neurological: She is alert and oriented to person, place, and time. No cranial nerve deficit. She exhibits normal muscle tone.  Skin: Skin is warm and dry. No rash noted. No erythema. No pallor.  Nursing note and vitals reviewed.   ED Course  Procedures   DIAGNOSTIC STUDIES: Oxygen Saturation is 100% on RA, normal by my interpretation.    COORDINATION OF CARE: 12:50 AM Discussed treatment plan with pt at bedside and pt  agreed to plan.   Labs Review Labs Reviewed  CBC WITH DIFFERENTIAL/PLATELET - Abnormal; Notable for the following:    Hemoglobin 11.2 (*)    HCT 34.3 (*)    Monocytes Relative 15 (*)    All other components within normal limits  PROTIME-INR - Abnormal; Notable for the following:    Prothrombin Time 16.0 (*)    All other components within normal limits  COMPREHENSIVE METABOLIC PANEL - Abnormal; Notable for the following:    Sodium 132 (*)    Potassium 3.3 (*)    Glucose, Bld 107 (*)    Total Protein 9.1 (*)    All other components within normal limits  TROPONIN I  I-STAT TROPOININ, ED    Imaging Review Dg Chest 2 View  01/16/2015   CLINICAL DATA:  Left-sided chest pain and dyspnea, beginning earlier this morning.  EXAM: CHEST  2 VIEW  COMPARISON:  11/22/2014  FINDINGS: There is unchanged curvilinear scarring in the right middle lobe. The lungs are otherwise clear. Hilar, mediastinal and cardiac contours appear unremarkable and unchanged. There are no pleural effusions.  IMPRESSION: No active cardiopulmonary disease.   Electronically Signed   By: Andreas Newport M.D.   On: 01/16/2015 21:58   Ct Angio Chest Pe W/cm &/or Wo Cm  01/17/2015   CLINICAL DATA:  Sharp intermittent left-sided chest pain radiating into left arm. Dyspnea.  EXAM: CT ANGIOGRAPHY CHEST WITH  CONTRAST  TECHNIQUE: Multidetector CT imaging of the chest was performed using the standard protocol during bolus administration of intravenous contrast. Multiplanar CT image reconstructions and MIPs were obtained to evaluate the vascular anatomy.  CONTRAST:  63mL OMNIPAQUE IOHEXOL 350 MG/ML SOLN  COMPARISON:  11/22/2014  FINDINGS: Cardiovascular: There is good opacification of the pulmonary vasculature. There is no pulmonary embolism. The thoracic aorta is normal in caliber and intact.  Lungs: Mild patchy opacity in the right upper lobe has decreased from 11/22/2014. There is unchanged linear scarring in both bases. No new or enlarging parenchymal lung opacities are evident.  Central airways: Patent  Effusions: None  Lymphadenopathy: None  Esophagus: Unremarkable  Upper abdomen: No significant abnormality  Musculoskeletal: No significant abnormality  Review of the MIP images confirms the above findings.  IMPRESSION: Negative for pulmonary embolism. Diminishing ground-glass right upper lobe opacity. No acute cardiopulmonary findings are evident.   Electronically Signed   By: Andreas Newport M.D.   On: 01/17/2015 02:39     EKG Interpretation   Date/Time:  Thursday January 17 2015 00:50:22 EST Ventricular Rate:  87 PR Interval:  160 QRS Duration: 67 QT Interval:  353 QTC Calculation: 425 R Axis:   13 Text Interpretation:  Sinus rhythm Anterior infarct, old No significant  change since last tracing Confirmed by Glynn Octave [id number]) on  01/17/2015 1:37:19 AM      MDM   Final diagnoses:  Chest pain, unspecified chest pain type  Dyspnea  Patient presents as a transfer for CT to eval for PE.  CT scan is negative and shows improved RUL ground-glass opacity.  Patients repeat troponin is also negative and HEART score is less than 3.  Primary care follow up advised, her VS remain within her normal limits and she is safe for DC.   I personally performed the services described in this  documentation, which was scribed in my presence. The recorded information has been reviewed and is accurate.     Marie Balls, MD 01/17/15 [phone]

## 2015-01-17 NOTE — ED Notes (Signed)
MD at bedside. 

## 2015-01-17 NOTE — Discharge Instructions (Signed)
Chest Pain (Nonspecific) Marie Tanner, your CT scan did not show any blood clots.  It shows a right upper lobe opacity that has improved since your last CT scan with [place].  Your heart markers for heart attack were negative as well.  Follow up with your primary care physician within 3 days for continued management.  If symptoms worsen, come back to the ED for repeat evaluation.  Thank you. It is often hard to give a diagnosis for the cause of chest pain. There is always a chance that your pain could be related to something serious, such as a heart attack or a blood clot in the lungs. You need to follow up with your doctor. HOME CARE  If antibiotic medicine was given, take it as directed by your doctor. Finish the medicine even if you start to feel better.  For the next few days, avoid activities that bring on chest pain. Continue physical activities as told by your doctor.  Do not use any tobacco products. This includes cigarettes, chewing tobacco, and e-cigarettes.  Avoid drinking alcohol.  Only take medicine as told by your doctor.  Follow your doctor's suggestions for more testing if your chest pain does not go away.  Keep all doctor visits you made. GET HELP IF:  Your chest pain does not go away, even after treatment.  You have a rash with blisters on your chest.  You have a fever. GET HELP RIGHT AWAY IF:   You have more pain or pain that spreads to your arm, neck, jaw, back, or belly (abdomen).  You have shortness of breath.  You cough more than usual or cough up blood.  You have very bad back or belly pain.  You feel sick to your stomach (nauseous) or throw up (vomit).  You have very bad weakness.  You pass out (faint).  You have chills. This is an emergency. Do not wait to see if the problems will go away. Call your local emergency services (911 in [place] Do not drive yourself to the hospital. MAKE SURE YOU:   Understand these instructions.  Will watch your  condition.  Will get help right away if you are not doing well or get worse. Document Released: 05/04/2008 Document Revised: 11/21/2013 Document Reviewed: 05/04/2008 [facility] Patient Information 2015 [place], [place]. This information is not intended to replace advice given to you by your health care provider. Make sure you discuss any questions you have with your health care provider.

## 2015-01-24 ENCOUNTER — Ambulatory Visit (HOSPITAL_COMMUNITY): Payer: BC Managed Care – PPO

## 2015-01-30 ENCOUNTER — Ambulatory Visit (HOSPITAL_COMMUNITY): Payer: BC Managed Care – PPO

## 2015-01-30 ENCOUNTER — Encounter: Payer: Self-pay | Admitting: Cardiology

## 2015-01-30 ENCOUNTER — Ambulatory Visit (INDEPENDENT_AMBULATORY_CARE_PROVIDER_SITE_OTHER): Payer: BC Managed Care – PPO | Admitting: Cardiology

## 2015-01-30 VITALS — BP 112/70 | HR 92 | Ht 62.0 in | Wt 171.0 lb

## 2015-01-30 DIAGNOSIS — I5042 Chronic combined systolic (congestive) and diastolic (congestive) heart failure: Secondary | ICD-10-CM | POA: Insufficient documentation

## 2015-01-30 DIAGNOSIS — I259 Chronic ischemic heart disease, unspecified: Secondary | ICD-10-CM | POA: Insufficient documentation

## 2015-01-30 DIAGNOSIS — I240 Acute coronary thrombosis not resulting in myocardial infarction: Secondary | ICD-10-CM

## 2015-01-30 DIAGNOSIS — I519 Heart disease, unspecified: Secondary | ICD-10-CM

## 2015-01-30 DIAGNOSIS — I340 Nonrheumatic mitral (valve) insufficiency: Secondary | ICD-10-CM

## 2015-01-30 HISTORY — DX: Chronic combined systolic (congestive) and diastolic (congestive) heart failure: I50.42

## 2015-01-30 NOTE — Patient Instructions (Signed)
OK FOR CARDIAC REHAB  STOP ASPIRIN 81 MG   Your physician recommends that you schedule a follow-up appointment in: 2 MONTH OV/EKG   GET ESTABLISHED IN COUMADIN CLINIC  (NEW PATIENT) AROUND 02/15/15

## 2015-01-30 NOTE — Progress Notes (Signed)
Marie Tanner Date of Birth:  [birth year] [facility] [address] [place], [place]  [address] [phone]        Fax   [phone]   History of Present Illness: This pleasant 59 year old African-American woman is seen by me for the first time today.  She is seen at the request of Dr. Rich Number in [place].  She is seen to establish cardiology care here in [place] and also to provide entrance to the [facility] cardiac rehabilitation program.  She has a complex past cardiac history.  She was previously living in the [place] area.  On 10/19/2014 she developed chest pain and was taken by ambulance to [facility] where she was taken emergently to the catheterization lab.  The patient states that during the cardiac catheterization she had a cardiac arrest with resuscitation.  She had a prolonged hospital course.  She had to have a tracheostomy.  She suffered a laceration of her liver during the resuscitation.  She apparently had a complication with the femoral artery catheterization and required repair of the artery.  Her family was told that she might lose her leg but fortunately circulation was restored.  With the STEMI she was treated with 2 stents.  She was discharged on baby aspirin and on Brilinta and was told that she would be on those for 2 years.  While in the hospital at [facility] she also developed a blood clot in her left leg and was placed on warfarin.  She has been on warfarin since November following discharge from [facility] she developed respiratory insufficiency and a week later was admitted to [facility] with pneumonia which responded to antibiotic therapy.  She then did well until February 18 when she developed chest pain and went to the [facility] emergency room where she underwent a CT angiogram of the chest which did not show any pulmonary emboli.  She continues on triple anticoagulation therapy.  She had a two-dimensional echocardiogram at  [facility] on 11/23/14 which showed an ejection fraction of 40% and mild to moderate mitral regurgitation.  Multiple wall motion abnormalities were seen on echo.  Her most recent EKG on 01/17/15 showed normal sinus rhythm with a pattern of an old anterior wall myocardial infarction. Her risk factors for coronary disease include being a diabetic.  She is on metformin.  She also has a history of hypercholesterolemia and essential hypertension.  She has never been a smoker. Her family history reveals that both parents are deceased.  Her father had a heart attack at age 72.  Her mother had COPD and diabetes mellitus. 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  She has not returned to work since her heart attack.  She is anxious to join the cardiac rehabilitation program. She has not been experiencing any exertional chest pain.  She has not been experiencing any significant dyspnea on exertion.  She has not been aware of any racing of her heart or palpitations.  Current Outpatient Prescriptions  Medication Sig Dispense Refill  . acetaminophen (TYLENOL) 500 MG tablet Take 500 mg by mouth 2 (two) times daily as needed (pain).    [name] atorvastatin (LIPITOR) 80 MG tablet Take 80 mg by mouth at bedtime.     . furosemide (LASIX) 40 MG tablet Take 40 mg by mouth daily.     [name] lisinopril (PRINIVIL,ZESTRIL) 2.5 MG tablet Take 1 tablet (2.5 mg  total) by mouth daily. (Patient taking differently: Take 10 mg by mouth daily. ) 30 tablet 0  . magnesium oxide (MAG-OX) 400 MG tablet Take 1,200 mg by mouth 3 (three) times daily.     . metFORMIN (GLUCOPHAGE) 500 MG tablet Take 500 mg by mouth daily with breakfast.     . metoprolol tartrate (LOPRESSOR) 25 MG tablet Take 12.5 mg by mouth every 12 (twelve) hours.     . potassium chloride SA (K-DUR,KLOR-CON) 20 MEQ tablet Take 20 mEq by mouth daily.     . ticagrelor (BRILINTA) 90 MG TABS  tablet Take 90 mg by mouth every 12 (twelve) hours.     [name] warfarin (COUMADIN) 1 MG tablet Take 2 mg by mouth at bedtime.      No current facility-administered medications for this visit.    Allergies  Allergen Reactions  . Epinephrine Other (See Comments)    Severe anxiety  . Lactose Intolerance (Gi) Diarrhea    Patient Active Problem List   Diagnosis Date Noted  . Mitral regurgitation 01/30/2015  . Ischemic heart disease due to coronary artery obstruction 01/30/2015  . Left ventricular systolic dysfunction 89/21/1939  . Dyspnea 11/24/2014  . Cardiomyopathy 11/24/2014  . DVT (deep venous thrombosis) 11/24/2014  . DM type 2 (diabetes mellitus, type 2) 11/24/2014  . HCAP (healthcare-associated pneumonia) 11/22/2014    History  Smoking status  . Never Smoker   Smokeless tobacco  . Never Used    History  Alcohol Use No    Family History  Problem Relation Age of Onset  . Diabetes Mother   . Hypertension Mother   . COPD Mother   . Heart attack Father   . Alzheimer's disease Father     Review of Systems: Constitutional: no fever chills diaphoresis or fatigue or change in weight.  Head and neck: no hearing loss, no epistaxis, no photophobia or visual disturbance. Respiratory: No cough, shortness of breath or wheezing. Cardiovascular: No chest pain peripheral edema, palpitations. Gastrointestinal: No abdominal distention, no abdominal pain, no change in bowel habits hematochezia or melena. Genitourinary: No dysuria, no frequency, no urgency, no nocturia. Musculoskeletal:No arthralgias, no back pain, no gait disturbance or myalgias. Neurological: No dizziness, no headaches, no numbness, no seizures, no syncope, no weakness, no tremors. Hematologic: No lymphadenopathy, no easy bruising. Psychiatric: No confusion, no hallucinations, no sleep disturbance.   Wt Readings from Last 3 Encounters:  01/30/15 171 lb (77.565 kg)  01/16/15 165 lb (74.844 kg)  11/26/14 177 lb  11.1 oz (80.6 kg)    Physical Exam: Filed Vitals:   01/30/15 1125  BP: 112/70  Pulse: 92  The patient appears to be in no distress.  Head and neck exam reveals that the pupils are equal and reactive.  The extraocular movements are full.  There is no scleral icterus.  Mouth and pharynx are benign.  No lymphadenopathy.  No carotid bruits.  The jugular venous pressure is normal.  Thyroid is not enlarged or tender.  The tracheostomy scar is well-healed   Chest is clear to percussion and auscultation.  No rales or rhonchi.  Expansion of the chest is symmetrical.  Heart reveals no abnormal lift or heave.  First and second heart sounds are normal.  There is a grade 3/6 holosystolic apical murmur.  The abdomen is soft and nontender.  Bowel sounds are normoactive.  There is no hepatosplenomegaly or mass.  There are no abdominal bruits.  Extremities reveal no phlebitis or edema.  Pedal pulses are good.  There is no cyanosis or clubbing.  Neurologic exam is normal strength and no lateralizing weakness.  No sensory deficits.  Integument reveals no rash  The EKG from 01/17/15 was reviewed.  It shows normal sinus rhythm and pattern of an old anterior wall myocardial infarction  Assessment / Plan: 1.  Ischemic heart disease status post STEMI on November 20/2015 treated at [facility] with 2 drug-eluting stents.  Cardiac catheterization was complicated by cardiac arrest and resuscitation and laceration of liver.  She required tracheostomy. 2.  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  Mild to moderate mitral regurgitation by echocardiogram 11/23/14 4.  Adult-onset diabetes mellitus 5.  DVT of left leg, on Coumadin for 6 months  Disposition: The patient will continue current medication except stop baby aspirin in order that she would not be on 3 anticoagulants long-term continue Brilinta and warfarin. The patient is cleared to enter the [facility] cardiac rehabilitation program.  She  has already spoken with them. The patient indicates a desire to transfer to our Coumadin clinic.  We will try to facilitate that.  The patient now lives in [place] rather than [place]. We will plan to see the patient back in about 2 months for office visit and EKG.  Anticipate that she will be on warfarin for a total of 6 months which will be completed in May.  We will plan to repeat her venous Doppler of legs in May prior to stopping warfarin. Many thanks for the opportunity to see this pleasant woman with you.

## 2015-02-01 ENCOUNTER — Ambulatory Visit (HOSPITAL_COMMUNITY): Payer: BC Managed Care – PPO

## 2015-02-04 ENCOUNTER — Ambulatory Visit (HOSPITAL_COMMUNITY): Payer: BC Managed Care – PPO

## 2015-02-06 ENCOUNTER — Ambulatory Visit (HOSPITAL_COMMUNITY): Payer: BC Managed Care – PPO

## 2015-02-08 ENCOUNTER — Ambulatory Visit (HOSPITAL_COMMUNITY): Payer: BC Managed Care – PPO

## 2015-02-11 ENCOUNTER — Ambulatory Visit (HOSPITAL_COMMUNITY): Payer: BC Managed Care – PPO

## 2015-02-13 ENCOUNTER — Ambulatory Visit (HOSPITAL_COMMUNITY): Payer: BC Managed Care – PPO

## 2015-02-15 ENCOUNTER — Ambulatory Visit (HOSPITAL_COMMUNITY): Payer: BC Managed Care – PPO

## 2015-02-18 ENCOUNTER — Ambulatory Visit (HOSPITAL_COMMUNITY): Payer: BC Managed Care – PPO

## 2015-02-19 ENCOUNTER — Ambulatory Visit (INDEPENDENT_AMBULATORY_CARE_PROVIDER_SITE_OTHER): Payer: BC Managed Care – PPO | Admitting: *Deleted

## 2015-02-19 DIAGNOSIS — Z5181 Encounter for therapeutic drug level monitoring: Secondary | ICD-10-CM

## 2015-02-19 DIAGNOSIS — I82402 Acute embolism and thrombosis of unspecified deep veins of left lower extremity: Secondary | ICD-10-CM | POA: Diagnosis not present

## 2015-02-19 LAB — POCT INR: INR: 2

## 2015-02-19 NOTE — Patient Instructions (Signed)

## 2015-02-20 ENCOUNTER — Ambulatory Visit (HOSPITAL_COMMUNITY): Payer: BC Managed Care – PPO

## 2015-02-22 ENCOUNTER — Ambulatory Visit (HOSPITAL_COMMUNITY): Payer: BC Managed Care – PPO

## 2015-02-25 ENCOUNTER — Ambulatory Visit (INDEPENDENT_AMBULATORY_CARE_PROVIDER_SITE_OTHER): Payer: BC Managed Care – PPO

## 2015-02-25 ENCOUNTER — Ambulatory Visit (HOSPITAL_COMMUNITY): Payer: BC Managed Care – PPO

## 2015-02-25 DIAGNOSIS — Z5181 Encounter for therapeutic drug level monitoring: Secondary | ICD-10-CM

## 2015-02-25 DIAGNOSIS — I82402 Acute embolism and thrombosis of unspecified deep veins of left lower extremity: Secondary | ICD-10-CM

## 2015-02-25 LAB — POCT INR: INR: 2

## 2015-02-27 ENCOUNTER — Ambulatory Visit (HOSPITAL_COMMUNITY): Payer: BC Managed Care – PPO

## 2015-03-01 ENCOUNTER — Ambulatory Visit (HOSPITAL_COMMUNITY): Payer: BC Managed Care – PPO

## 2015-03-04 ENCOUNTER — Ambulatory Visit (INDEPENDENT_AMBULATORY_CARE_PROVIDER_SITE_OTHER): Payer: BC Managed Care – PPO

## 2015-03-04 ENCOUNTER — Ambulatory Visit (HOSPITAL_COMMUNITY): Payer: BC Managed Care – PPO

## 2015-03-04 DIAGNOSIS — I82402 Acute embolism and thrombosis of unspecified deep veins of left lower extremity: Secondary | ICD-10-CM

## 2015-03-04 DIAGNOSIS — Z5181 Encounter for therapeutic drug level monitoring: Secondary | ICD-10-CM

## 2015-03-04 LAB — POCT INR: INR: 3.2

## 2015-03-06 ENCOUNTER — Ambulatory Visit (HOSPITAL_COMMUNITY): Payer: BC Managed Care – PPO

## 2015-03-07 ENCOUNTER — Encounter (HOSPITAL_COMMUNITY)
Admission: RE | Admit: 2015-03-07 | Discharge: 2015-03-07 | Disposition: A | Discharge status: Home / Self Care | Payer: BC Managed Care – PPO | Source: Ambulatory Visit | Attending: Cardiology | Admitting: Cardiology

## 2015-03-07 DIAGNOSIS — E119 Type 2 diabetes mellitus without complications: Secondary | ICD-10-CM | POA: Insufficient documentation

## 2015-03-07 DIAGNOSIS — Z955 Presence of coronary angioplasty implant and graft: Secondary | ICD-10-CM | POA: Insufficient documentation

## 2015-03-07 DIAGNOSIS — Z48812 Encounter for surgical aftercare following surgery on the circulatory system: Secondary | ICD-10-CM | POA: Insufficient documentation

## 2015-03-07 DIAGNOSIS — I252 Old myocardial infarction: Secondary | ICD-10-CM | POA: Insufficient documentation

## 2015-03-07 NOTE — Progress Notes (Signed)
Cardiac Rehab Medication Review by a Pharmacist  Does the patient  feel that his/her medications are working for him/her?  yes  Has the patient been experiencing any side effects to the medications prescribed?  no  Does the patient measure his/her own blood pressure or blood glucose at home?  yes   Does the patient have any problems obtaining medications due to transportation or finances?   no  Understanding of regimen: excellent Understanding of indications: good Potential of compliance: excellent    Pharmacist comments: I verified with pt today that she has stopped taking her aspirin now that [name] has her on Brilinta and warfarin.  Pt uses a pill box but reports sometimes missing her noon doses of magnesium.  She denies missing any morning or evening doses, uses a reminder on her phone to help her remember to take her meds on time each day.  She appears to be very familiar with her meds, appropriate use of each, and indications for each.  I have reviewed her meds with her and answered her questions.   Drucie Opitz, PharmD Clinical Pharmacy Resident Pager: [phone] 03/07/2015 8:48 AM

## 2015-03-08 ENCOUNTER — Ambulatory Visit (HOSPITAL_COMMUNITY): Payer: BC Managed Care – PPO

## 2015-03-11 ENCOUNTER — Ambulatory Visit (HOSPITAL_COMMUNITY): Payer: BC Managed Care – PPO

## 2015-03-11 ENCOUNTER — Encounter (HOSPITAL_COMMUNITY): Payer: BC Managed Care – PPO

## 2015-03-11 ENCOUNTER — Ambulatory Visit (INDEPENDENT_AMBULATORY_CARE_PROVIDER_SITE_OTHER): Payer: BC Managed Care – PPO | Admitting: *Deleted

## 2015-03-11 ENCOUNTER — Encounter (HOSPITAL_COMMUNITY)
Admission: RE | Admit: 2015-03-11 | Discharge: 2015-03-11 | Disposition: A | Discharge status: Home / Self Care | Payer: BC Managed Care – PPO | Source: Ambulatory Visit | Attending: Cardiology | Admitting: Cardiology

## 2015-03-11 DIAGNOSIS — E119 Type 2 diabetes mellitus without complications: Secondary | ICD-10-CM | POA: Diagnosis not present

## 2015-03-11 DIAGNOSIS — I252 Old myocardial infarction: Secondary | ICD-10-CM | POA: Diagnosis not present

## 2015-03-11 DIAGNOSIS — I82402 Acute embolism and thrombosis of unspecified deep veins of left lower extremity: Secondary | ICD-10-CM | POA: Diagnosis not present

## 2015-03-11 DIAGNOSIS — Z5181 Encounter for therapeutic drug level monitoring: Secondary | ICD-10-CM

## 2015-03-11 DIAGNOSIS — Z48812 Encounter for surgical aftercare following surgery on the circulatory system: Secondary | ICD-10-CM | POA: Diagnosis not present

## 2015-03-11 DIAGNOSIS — Z955 Presence of coronary angioplasty implant and graft: Secondary | ICD-10-CM | POA: Diagnosis not present

## 2015-03-11 LAB — POCT INR: INR: 2.8

## 2015-03-11 LAB — GLUCOSE, CAPILLARY
GLUCOSE-CAPILLARY: 104 mg/dL — AB (ref 70–99)
Glucose-Capillary: 122 mg/dL — ABNORMAL HIGH (ref 70–99)

## 2015-03-11 MED ORDER — WARFARIN SODIUM 1 MG PO TABS: 2.0000 mg | ORAL_TABLET | Freq: Every day | ORAL | Status: DC

## 2015-03-11 NOTE — Progress Notes (Addendum)
Pt started cardiac rehab today.  Pt tolerated light exercise without difficulty. Telemetry rhythm Sinus. Vital signs stable. Rockelle exercise at low levels today and was encouraged to take rest breaks as needed. Jermiyah reported that she still experiences some intermittent numbness in her right groin area since her November hospitalization. Psychosocial Assessment: PHQ=1. Mckala told me she has a lot of stress in her life even before her hospitalization in November. Yena says she has split up with her husband and is in the process of getting a divorce. Mariellen has been living with her  son who lives here in town. Navya's son and his wife had twins this past September. Naviah says she is also stressed about her relationship with her daughter. Keishawna says she has been somewhat depressed but denies being suicidal. Meleena says she thinks she would benefit from counseling.  Patient's primary care physician office  Called.  Appointment made for Ms Analisia Kingsford to see Dr Clemmie Krill on Thursday at 11:15. Debera scored low in all areas of her quality of life questionnaire. Will forward quality of life questionnaire and psychosocial assessment to Dr Eula Flax office for review. Emotional support given. Will continue to monitor the patient throughout  the program. Eudell's short term goals are to have more strength in her legs and arms. Scarlet's long term goals are to have more energy and stamina.

## 2015-03-12 NOTE — Progress Notes (Signed)
Patient is interested in Vocational rehabilitation. Patient given a vocational rehab packet to fill out and return to cardiac rehab

## 2015-03-13 ENCOUNTER — Ambulatory Visit (HOSPITAL_COMMUNITY): Payer: BC Managed Care – PPO

## 2015-03-13 ENCOUNTER — Encounter (HOSPITAL_COMMUNITY): Payer: BC Managed Care – PPO

## 2015-03-13 ENCOUNTER — Encounter (HOSPITAL_COMMUNITY)
Admission: RE | Admit: 2015-03-13 | Discharge: 2015-03-13 | Disposition: A | Discharge status: Home / Self Care | Payer: BC Managed Care – PPO | Source: Ambulatory Visit | Attending: Cardiology | Admitting: Cardiology

## 2015-03-13 DIAGNOSIS — Z48812 Encounter for surgical aftercare following surgery on the circulatory system: Secondary | ICD-10-CM | POA: Diagnosis not present

## 2015-03-13 LAB — GLUCOSE, CAPILLARY
Glucose-Capillary: 117 mg/dL — ABNORMAL HIGH (ref 70–99)
Glucose-Capillary: 97 mg/dL (ref 70–99)

## 2015-03-15 ENCOUNTER — Encounter (HOSPITAL_COMMUNITY)
Admission: RE | Admit: 2015-03-15 | Discharge: 2015-03-15 | Disposition: A | Discharge status: Home / Self Care | Payer: BC Managed Care – PPO | Source: Ambulatory Visit | Attending: Cardiology | Admitting: Cardiology

## 2015-03-15 ENCOUNTER — Encounter (HOSPITAL_COMMUNITY): Payer: BC Managed Care – PPO

## 2015-03-15 ENCOUNTER — Ambulatory Visit (HOSPITAL_COMMUNITY): Payer: BC Managed Care – PPO

## 2015-03-15 DIAGNOSIS — Z48812 Encounter for surgical aftercare following surgery on the circulatory system: Secondary | ICD-10-CM | POA: Diagnosis not present

## 2015-03-15 LAB — GLUCOSE, CAPILLARY
GLUCOSE-CAPILLARY: 108 mg/dL — AB (ref 70–99)
Glucose-Capillary: 129 mg/dL — ABNORMAL HIGH (ref 70–99)

## 2015-03-18 ENCOUNTER — Encounter (HOSPITAL_COMMUNITY): Payer: BC Managed Care – PPO

## 2015-03-18 ENCOUNTER — Ambulatory Visit (HOSPITAL_COMMUNITY): Payer: BC Managed Care – PPO

## 2015-03-18 ENCOUNTER — Encounter (HOSPITAL_COMMUNITY)
Admission: RE | Admit: 2015-03-18 | Discharge: 2015-03-18 | Disposition: A | Discharge status: Home / Self Care | Payer: BC Managed Care – PPO | Source: Ambulatory Visit | Attending: Cardiology | Admitting: Cardiology

## 2015-03-18 DIAGNOSIS — Z48812 Encounter for surgical aftercare following surgery on the circulatory system: Secondary | ICD-10-CM | POA: Diagnosis not present

## 2015-03-18 LAB — GLUCOSE, CAPILLARY
Glucose-Capillary: 138 mg/dL — ABNORMAL HIGH (ref 70–99)
Glucose-Capillary: 100 mg/dL — ABNORMAL HIGH (ref 70–99)

## 2015-03-18 NOTE — Progress Notes (Signed)
Academic intern reviewed home exercise with pt today.  Pt plans to walk at home for exercise.  Reviewed THR, pulse, RPE, sign and symptoms, NTG use, and when to call 911 or MD.  Pt voiced understanding. Shirlyn Goltz, Academic Intern Alberteen Sam, MA, ACSM RCEP

## 2015-03-19 ENCOUNTER — Ambulatory Visit (INDEPENDENT_AMBULATORY_CARE_PROVIDER_SITE_OTHER): Payer: BC Managed Care – PPO | Admitting: *Deleted

## 2015-03-19 DIAGNOSIS — I82402 Acute embolism and thrombosis of unspecified deep veins of left lower extremity: Secondary | ICD-10-CM

## 2015-03-19 DIAGNOSIS — Z5181 Encounter for therapeutic drug level monitoring: Secondary | ICD-10-CM | POA: Diagnosis not present

## 2015-03-19 LAB — POCT INR: INR: 3.1

## 2015-03-20 ENCOUNTER — Encounter (HOSPITAL_COMMUNITY)
Admission: RE | Admit: 2015-03-20 | Discharge: 2015-03-20 | Disposition: A | Discharge status: Home / Self Care | Payer: BC Managed Care – PPO | Source: Ambulatory Visit | Attending: Cardiology | Admitting: Cardiology

## 2015-03-20 ENCOUNTER — Encounter (HOSPITAL_COMMUNITY): Payer: BC Managed Care – PPO

## 2015-03-20 ENCOUNTER — Ambulatory Visit (HOSPITAL_COMMUNITY): Payer: BC Managed Care – PPO

## 2015-03-20 DIAGNOSIS — Z48812 Encounter for surgical aftercare following surgery on the circulatory system: Secondary | ICD-10-CM | POA: Diagnosis not present

## 2015-03-20 LAB — GLUCOSE, CAPILLARY
GLUCOSE-CAPILLARY: 110 mg/dL — AB (ref 70–99)
GLUCOSE-CAPILLARY: 119 mg/dL — AB (ref 70–99)

## 2015-03-22 ENCOUNTER — Encounter (HOSPITAL_COMMUNITY)
Admission: RE | Admit: 2015-03-22 | Discharge: 2015-03-22 | Disposition: A | Discharge status: Home / Self Care | Payer: BC Managed Care – PPO | Source: Ambulatory Visit | Attending: Cardiology | Admitting: Cardiology

## 2015-03-22 ENCOUNTER — Ambulatory Visit (HOSPITAL_COMMUNITY): Payer: BC Managed Care – PPO

## 2015-03-22 ENCOUNTER — Encounter (HOSPITAL_COMMUNITY): Payer: BC Managed Care – PPO

## 2015-03-22 DIAGNOSIS — Z48812 Encounter for surgical aftercare following surgery on the circulatory system: Secondary | ICD-10-CM | POA: Diagnosis not present

## 2015-03-22 LAB — GLUCOSE, CAPILLARY
Glucose-Capillary: 106 mg/dL — ABNORMAL HIGH (ref 70–99)
Glucose-Capillary: 92 mg/dL (ref 70–99)

## 2015-03-25 ENCOUNTER — Ambulatory Visit (HOSPITAL_COMMUNITY): Payer: BC Managed Care – PPO

## 2015-03-25 ENCOUNTER — Encounter (HOSPITAL_COMMUNITY): Payer: BC Managed Care – PPO

## 2015-03-25 ENCOUNTER — Encounter (HOSPITAL_COMMUNITY)
Admission: RE | Admit: 2015-03-25 | Discharge: 2015-03-25 | Disposition: A | Discharge status: Home / Self Care | Payer: BC Managed Care – PPO | Source: Ambulatory Visit | Attending: Cardiology | Admitting: Cardiology

## 2015-03-25 DIAGNOSIS — Z48812 Encounter for surgical aftercare following surgery on the circulatory system: Secondary | ICD-10-CM | POA: Diagnosis not present

## 2015-03-25 LAB — GLUCOSE, CAPILLARY
GLUCOSE-CAPILLARY: 125 mg/dL — AB (ref 70–99)
Glucose-Capillary: 122 mg/dL — ABNORMAL HIGH (ref 70–99)

## 2015-03-27 ENCOUNTER — Encounter (HOSPITAL_COMMUNITY): Payer: BC Managed Care – PPO

## 2015-03-27 ENCOUNTER — Encounter (HOSPITAL_COMMUNITY)
Admission: RE | Admit: 2015-03-27 | Discharge: 2015-03-27 | Disposition: A | Discharge status: Home / Self Care | Payer: BC Managed Care – PPO | Source: Ambulatory Visit | Attending: Cardiology | Admitting: Cardiology

## 2015-03-27 ENCOUNTER — Ambulatory Visit (HOSPITAL_COMMUNITY): Payer: BC Managed Care – PPO

## 2015-03-27 DIAGNOSIS — Z48812 Encounter for surgical aftercare following surgery on the circulatory system: Secondary | ICD-10-CM | POA: Diagnosis not present

## 2015-03-27 LAB — GLUCOSE, CAPILLARY
Glucose-Capillary: 119 mg/dL — ABNORMAL HIGH (ref 70–99)
Glucose-Capillary: 102 mg/dL — ABNORMAL HIGH (ref 70–99)

## 2015-03-27 NOTE — Progress Notes (Signed)
Marie Tanner 58 y.o. female Nutrition Note Spoke with pt. Nutrition Plan and Nutrition Survey goals reviewed with pt. Pt is following Step 2 of the Therapeutic Lifestyle Changes diet. Pt wants to lose wt. Pt has been trying to lose wt by following a consistent carbohydrate diet, and monitoring portion sizes. Wt loss tips reviewed.  Pt is diabetic. Last A1c indicates blood glucose well-controlled. This writer went over Diabetes Education test results. Pt expressed understanding of the information reviewed. Pt aware of nutrition education classes offered.  Lab Results  Component Value Date   HGBA1C 6.3* 11/22/2014   Nutrition Diagnosis ? Food-and nutrition-related knowledge deficit related to lack of exposure to information as related to diagnosis of: ? CVD ? DM  ? Obesity related to excessive energy intake as evidenced by a BMI of 34.2  Nutrition RX/ Estimated Daily Nutrition Needs for: wt loss  1250-1500 Kcal, 30-41 gm fat, 9-11 gm sat fat, 1.2-1.5 gm trans-fat, <1500 mg sodium, 175-250 gm CHO   Nutrition Intervention ? Pt's individual nutrition plan reviewed with pt. ? Benefits of adopting Therapeutic Lifestyle Changes discussed when Medficts reviewed. ? Pt to attend the Portion Distortion class ? Pt to attend the Diabetes Q & A class  ? Pt to attend the   ? Nutrition I class                     ? Nutrition II class     ? Diabetes Blitz class (met; 03/19/15) ? Continue client-centered nutrition education by RD, as part of interdisciplinary care. Goal(s) ? Pt to identify food quantities necessary to achieve: ? wt loss to a goal wt of 155-165 lb (70.4-75 kg) at graduation from cardiac rehab.  ? Use pre-meal and post-meal CBG's and A1c to determine whether adjustments in food/meal planning will be beneficial or if any meds need to be combined with nutrition therapy. Monitor and Evaluate progress toward nutrition goal with team. Nutrition Risk: Low  Elisa Grant, MS Dietetic  Intern  Edna Franko, M.Ed, RD, LDN, CDE 03/27/2015 10:29 AM  

## 2015-03-29 ENCOUNTER — Encounter (HOSPITAL_COMMUNITY): Payer: BC Managed Care – PPO

## 2015-03-29 ENCOUNTER — Ambulatory Visit (INDEPENDENT_AMBULATORY_CARE_PROVIDER_SITE_OTHER): Payer: BC Managed Care – PPO | Admitting: *Deleted

## 2015-03-29 ENCOUNTER — Encounter (HOSPITAL_COMMUNITY)
Admission: RE | Admit: 2015-03-29 | Discharge: 2015-03-29 | Disposition: A | Discharge status: Home / Self Care | Payer: BC Managed Care – PPO | Source: Ambulatory Visit | Attending: Cardiology | Admitting: Cardiology

## 2015-03-29 ENCOUNTER — Ambulatory Visit (HOSPITAL_COMMUNITY): Payer: BC Managed Care – PPO

## 2015-03-29 DIAGNOSIS — Z5181 Encounter for therapeutic drug level monitoring: Secondary | ICD-10-CM | POA: Diagnosis not present

## 2015-03-29 DIAGNOSIS — Z48812 Encounter for surgical aftercare following surgery on the circulatory system: Secondary | ICD-10-CM | POA: Diagnosis not present

## 2015-03-29 DIAGNOSIS — I82402 Acute embolism and thrombosis of unspecified deep veins of left lower extremity: Secondary | ICD-10-CM | POA: Diagnosis not present

## 2015-03-29 LAB — POCT INR: INR: 3.6

## 2015-03-29 LAB — GLUCOSE, CAPILLARY
GLUCOSE-CAPILLARY: 143 mg/dL — AB (ref 70–99)
GLUCOSE-CAPILLARY: 93 mg/dL (ref 70–99)

## 2015-04-01 ENCOUNTER — Encounter (HOSPITAL_COMMUNITY): Payer: BC Managed Care – PPO

## 2015-04-01 ENCOUNTER — Ambulatory Visit (HOSPITAL_COMMUNITY): Payer: BC Managed Care – PPO

## 2015-04-01 ENCOUNTER — Telehealth (HOSPITAL_COMMUNITY): Payer: Self-pay | Admitting: Family Medicine

## 2015-04-02 ENCOUNTER — Ambulatory Visit (INDEPENDENT_AMBULATORY_CARE_PROVIDER_SITE_OTHER): Payer: BC Managed Care – PPO | Admitting: Cardiology

## 2015-04-02 ENCOUNTER — Encounter: Payer: Self-pay | Admitting: Cardiology

## 2015-04-02 VITALS — BP 126/82 | HR 81 | Ht 62.0 in | Wt 174.2 lb

## 2015-04-02 DIAGNOSIS — I259 Chronic ischemic heart disease, unspecified: Secondary | ICD-10-CM

## 2015-04-02 DIAGNOSIS — I340 Nonrheumatic mitral (valve) insufficiency: Secondary | ICD-10-CM

## 2015-04-02 DIAGNOSIS — I82402 Acute embolism and thrombosis of unspecified deep veins of left lower extremity: Secondary | ICD-10-CM

## 2015-04-02 DIAGNOSIS — I240 Acute coronary thrombosis not resulting in myocardial infarction: Secondary | ICD-10-CM | POA: Diagnosis not present

## 2015-04-02 NOTE — Progress Notes (Signed)
Cardiology Office Note   Date:  04/02/2015   ID:  Marie, Tanner 22-Dec-1955, MRN [id number]  PCP:  Marie Jude, MD  Cardiologist: Marie Coco MD  No chief complaint on file.     History of Present Illness: Marie Tanner is a 59 y.o. female who presents for 2 month follow-up office visit.   She was initially seen at the request of Dr. Rich Tanner in [place]. She was seen to establish cardiology care here in [place] and also to provide entrance to the [facility] cardiac rehabilitation program. She has a complex past cardiac history. She was previously living in the [place] area. On 10/19/2014 she developed chest pain and was taken by ambulance to [facility] where she was taken emergently to the catheterization lab. The patient states that during the cardiac catheterization she had a cardiac arrest with resuscitation. She had a prolonged hospital course. She had to have a tracheostomy. She suffered a laceration of her liver during the resuscitation. She apparently had a complication with the femoral artery catheterization and required repair of the artery. Her family was told that she might lose her leg but fortunately circulation was restored. With the STEMI she was treated with 2 stents. She was discharged on baby aspirin and on Brilinta and was told that she would be on those for 2 years. While in the hospital at [facility] she also developed a blood clot in her left leg and was placed on warfarin. She has been on warfarin since November following discharge from [facility] she developed respiratory insufficiency and a week later was admitted to [facility] with pneumonia which responded to antibiotic therapy. She then did well until February 18 when she developed chest pain and went to the [facility] emergency room where she underwent a CT angiogram of the chest which did not show any pulmonary emboli. She continues on triple anticoagulation  therapy. She had a two-dimensional echocardiogram at [facility] on 11/23/14 which showed an ejection fraction of 40% and mild to moderate mitral regurgitation. Multiple wall motion abnormalities were seen on echo. Her most recent EKG on 01/17/15 showed normal sinus rhythm with a pattern of an old anterior wall myocardial infarction. Her risk factors for coronary disease include being a diabetic. She is on metformin. She also has a history of hypercholesterolemia and essential hypertension. She has never been a smoker. Her family history reveals that both parents are deceased. Her father had a heart attack at age 24. Her mother had COPD and diabetes mellitus. 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 She has not returned to work since her heart attack. She is presently enjoying the outpatient cardiac rehabilitation program.  He will finish up in July She has not been experiencing any exertional chest pain. She has not been experiencing any significant dyspnea on exertion. She has not been aware of any racing of her heart or palpitations. She has been on warfarin for 6 months.  She is not having any symptoms of recurrent DVT of her legs. Her PCP recently stopped her atorvastatin temporarily because of myalgias in the legs.   Past Medical History  Diagnosis Date  . Renal disorder   . Coronary artery disease   . Liver laceration 10/19/14  . Pulmonary edema 10/19/14  . DVT (deep venous thrombosis) 11/15    left femerol artery injury during resusciation.  [name] STEMI (ST elevation myocardial infarction)  10/19/14  . Hypertension   . High cholesterol   . Heart murmur   . HCAP (healthcare-associated pneumonia) 11/22/2014  . Sleep apnea     "suppose to wear mask but I don't" (11/22/2014)  . Type II diabetes mellitus   . History of blood transfusion 09/2014    related to MI  . GERD  (gastroesophageal reflux disease)   . History of gout     Past Surgical History  Procedure Laterality Date  . Cesarean section  1980; 1982  . Laparoscopic abdominal exploration    . Liver laceration repair  09/2014    related to CPR/notes 11/22/2014  . Tracheostomy  09/2014    "closed on it's own when they took it out"  . Femerol artery repair Right 09/2014    pt insists it was a right femoral artery repair on 11/22/2014  . Extracorporeal circulation    . Left ventricular assist device      pt is not aware of this hx on 11/22/2014  . Abdominal hysterectomy  ~ 2000  . Tubal ligation  1982  . Coronary angioplasty with stent placement  09/2014    "multiple"/notes 11/22/2014  . Application of wound vac  10/2014    "over my naval" (11/22/2014)     Current Outpatient Prescriptions  Medication Sig Dispense Refill  . acetaminophen (TYLENOL) 500 MG tablet Take 500 mg by mouth 2 (two) times daily as needed (pain).    [name] atorvastatin (LIPITOR) 80 MG tablet Take 80 mg by mouth at bedtime.     . furosemide (LASIX) 40 MG tablet Take 40 mg by mouth daily.     [name] lisinopril (PRINIVIL,ZESTRIL) 10 MG tablet Take 10 mg by mouth daily.    . magnesium oxide (MAG-OX) 400 MG tablet Take 1,200 mg by mouth 3 (three) times daily.     . Melatonin 3 MG CAPS Take 3 mg by mouth at bedtime as needed (sleep).    . metFORMIN (GLUCOPHAGE) 500 MG tablet Take 500 mg by mouth daily with breakfast.     . metoprolol tartrate (LOPRESSOR) 25 MG tablet Take 12.5 mg by mouth every 12 (twelve) hours.     . nitroGLYCERIN (NITROSTAT) 0.4 MG SL tablet Place 0.4 mg under the tongue every 5 (five) minutes as needed for chest pain. Take every 5 minutes for max of 3 if no relief call [phone]    . potassium chloride SA (K-DUR,KLOR-CON) 20 MEQ tablet Take 20 mEq by mouth daily.     . sertraline (ZOLOFT) 50 MG tablet Take 50 mg by mouth daily.    . ticagrelor (BRILINTA) 90 MG TABS tablet Take 90 mg by mouth every 12 (twelve) hours.       [name] warfarin (COUMADIN) 1 MG tablet Take 2-3 tablets (2-3 mg total) by mouth at bedtime. As instructed by [facility] 70 tablet 3   No current facility-administered medications for this visit.    Allergies:   Epinephrine; Lactose; and Lactose intolerance (gi)    Social History:  The patient  reports that she has never smoked. She has never used smokeless tobacco. She reports that she does not drink alcohol or use illicit drugs.   Family History:  The patient's family history includes Alzheimer's disease in her father; COPD in her mother; Diabetes in her brother and mother; Heart attack in her father; Hypertension in her mother.    ROS:  Please see the history of present illness.   Otherwise, review of systems are positive for none.  All other systems are reviewed and negative.    PHYSICAL EXAM: VS:  BP 126/82 mmHg  Pulse 81  Ht 5\' 2"  (1.575 m)  Wt 174 lb 3.2 oz (79.017 kg)  BMI 31.85 kg/m2 , BMI Body mass index is 31.85 kg/(m^2). GEN: Well nourished, well developed, in no acute distress HEENT: normal Neck: no JVD, carotid bruits, or masses Cardiac: RRR; no murmurs, rubs, or gallops,no edema  Respiratory:  clear to auscultation bilaterally, normal work of breathing GI: soft, nontender, nondistended, + BS MS: no deformity or atrophy Skin: warm and dry, no rash Neuro:  Strength and sensation are intact Psych: euthymic mood, full affect   EKG:  EKG is  ordered today. The ekg ordered today demonstrates normal sinus rhythm and is unchanged from 01/17/15   Recent Labs: 11/24/2014: B Natriuretic Peptide 379.4* 01/16/2015: ALT 22; BUN 13; Creatinine 0.58; Hemoglobin 11.2*; Platelets 342; Potassium 3.3*; Sodium 132*    Lipid Panel No results found for: CHOL, TRIG, HDL, CHOLHDL, VLDL, LDLCALC, LDLDIRECT    Wt Readings from Last 3 Encounters:  04/02/15 174 lb 3.2 oz (79.017 kg)  03/07/15 176 lb 9.4 oz (80.1 kg)  01/30/15 171 lb (77.565 kg)         ASSESSMENT AND  PLAN:  1. Ischemic heart disease status post STEMI on November 20/2015 treated at [facility] with 2 drug-eluting stents. Cardiac catheterization was complicated by cardiac arrest and resuscitation and laceration of liver. She required tracheostomy. 2. 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 Mild to moderate mitral regurgitation by echocardiogram 11/23/14 4. Adult-onset diabetes mellitus 5. DVT of left leg, on Coumadin for 6 months  Disposition: The patient will continue current medication .  We will obtain venous Dopplers of her legs.  If there is no DVT, we will stop warfarin and resume baby aspirin and continue Brilinta. Recheck in 3 months for follow-up office visit. If she is intolerant of atorvastatin we may wish to have her try generic Crestor.   Current medicines are reviewed at length with the patient today.  The patient does not have concerns regarding medicines.  The following changes have been made:  no change  Labs/ tests ordered today include:   Orders Placed This Encounter  Procedures  . EKG 12-Lead      Signed, Marie Coco MD 04/02/2015 5:40 PM    [facility] [address], [place], [place]  [address] Phone: [phone]; Fax: [phone]

## 2015-04-02 NOTE — Patient Instructions (Signed)
Medication Instructions:  Your physician recommends that you continue on your current medications as directed. Please refer to the Current Medication list given to you today.  Labwork: none  Testing/Procedures: Your physician has requested that you have a lower or upper extremity arterial duplex. This test is an ultrasound of the arteries in the legs or arms. It looks at arterial blood flow in the legs and arms. Allow one hour for Lower and Upper Arterial scans. There are no restrictions or special instructions  Follow-Up: Your physician recommends that you schedule a follow-up appointment in: 3 month ov  Any Other Special Instructions Will Be Listed Below (If Applicable).

## 2015-04-03 ENCOUNTER — Encounter (HOSPITAL_COMMUNITY): Payer: BC Managed Care – PPO

## 2015-04-03 ENCOUNTER — Ambulatory Visit (HOSPITAL_COMMUNITY): Payer: BC Managed Care – PPO

## 2015-04-03 ENCOUNTER — Ambulatory Visit (HOSPITAL_COMMUNITY): Payer: BC Managed Care – PPO | Attending: Cardiovascular Disease

## 2015-04-03 ENCOUNTER — Encounter (HOSPITAL_COMMUNITY)
Admission: RE | Admit: 2015-04-03 | Discharge: 2015-04-03 | Disposition: A | Discharge status: Home / Self Care | Payer: BC Managed Care – PPO | Source: Ambulatory Visit | Attending: Cardiology | Admitting: Cardiology

## 2015-04-03 DIAGNOSIS — I10 Essential (primary) hypertension: Secondary | ICD-10-CM | POA: Diagnosis not present

## 2015-04-03 DIAGNOSIS — I2510 Atherosclerotic heart disease of native coronary artery without angina pectoris: Secondary | ICD-10-CM | POA: Diagnosis not present

## 2015-04-03 DIAGNOSIS — I739 Peripheral vascular disease, unspecified: Secondary | ICD-10-CM | POA: Diagnosis not present

## 2015-04-03 DIAGNOSIS — I82402 Acute embolism and thrombosis of unspecified deep veins of left lower extremity: Secondary | ICD-10-CM

## 2015-04-03 DIAGNOSIS — I252 Old myocardial infarction: Secondary | ICD-10-CM | POA: Insufficient documentation

## 2015-04-03 DIAGNOSIS — Z48812 Encounter for surgical aftercare following surgery on the circulatory system: Secondary | ICD-10-CM | POA: Diagnosis not present

## 2015-04-03 DIAGNOSIS — E785 Hyperlipidemia, unspecified: Secondary | ICD-10-CM | POA: Diagnosis not present

## 2015-04-03 DIAGNOSIS — Z955 Presence of coronary angioplasty implant and graft: Secondary | ICD-10-CM | POA: Insufficient documentation

## 2015-04-03 DIAGNOSIS — E119 Type 2 diabetes mellitus without complications: Secondary | ICD-10-CM | POA: Insufficient documentation

## 2015-04-03 LAB — GLUCOSE, CAPILLARY
Glucose-Capillary: 113 mg/dL — ABNORMAL HIGH (ref 70–99)
Glucose-Capillary: 98 mg/dL (ref 70–99)

## 2015-04-05 ENCOUNTER — Ambulatory Visit (HOSPITAL_COMMUNITY): Payer: BC Managed Care – PPO

## 2015-04-05 ENCOUNTER — Encounter (HOSPITAL_COMMUNITY): Payer: BC Managed Care – PPO

## 2015-04-05 ENCOUNTER — Telehealth: Payer: Self-pay | Admitting: *Deleted

## 2015-04-05 NOTE — Telephone Encounter (Signed)
[name] reviewed venous doppler of legs and was normal Per  [name] STOP Warfarin, CONTINUE Brilinta, and ADD Asprin 81 mg daily   Left message to call back

## 2015-04-08 ENCOUNTER — Encounter (HOSPITAL_COMMUNITY): Payer: BC Managed Care – PPO

## 2015-04-08 ENCOUNTER — Encounter (HOSPITAL_COMMUNITY)
Admission: RE | Admit: 2015-04-08 | Discharge: 2015-04-08 | Disposition: A | Discharge status: Home / Self Care | Payer: BC Managed Care – PPO | Source: Ambulatory Visit | Attending: Cardiology | Admitting: Cardiology

## 2015-04-08 ENCOUNTER — Ambulatory Visit (HOSPITAL_COMMUNITY): Payer: BC Managed Care – PPO

## 2015-04-08 DIAGNOSIS — Z48812 Encounter for surgical aftercare following surgery on the circulatory system: Secondary | ICD-10-CM | POA: Diagnosis not present

## 2015-04-08 LAB — GLUCOSE, CAPILLARY
Glucose-Capillary: 109 mg/dL — ABNORMAL HIGH (ref 70–99)
Glucose-Capillary: 102 mg/dL — ABNORMAL HIGH (ref 70–99)

## 2015-04-08 NOTE — Telephone Encounter (Signed)
I spoke with the patient and made her aware of her results and Dr. Sherryl Barters recommendations for her medications. She verbalizes understanding.

## 2015-04-08 NOTE — Telephone Encounter (Signed)
Follow up      Returning Marie Tanner's call from friday

## 2015-04-10 ENCOUNTER — Encounter (HOSPITAL_COMMUNITY): Payer: BC Managed Care – PPO

## 2015-04-10 ENCOUNTER — Encounter (HOSPITAL_COMMUNITY)
Admission: RE | Admit: 2015-04-10 | Discharge: 2015-04-10 | Disposition: A | Discharge status: Home / Self Care | Payer: BC Managed Care – PPO | Source: Ambulatory Visit | Attending: Cardiology | Admitting: Cardiology

## 2015-04-10 ENCOUNTER — Ambulatory Visit (HOSPITAL_COMMUNITY): Payer: BC Managed Care – PPO

## 2015-04-10 DIAGNOSIS — Z48812 Encounter for surgical aftercare following surgery on the circulatory system: Secondary | ICD-10-CM | POA: Diagnosis not present

## 2015-04-10 LAB — GLUCOSE, CAPILLARY
GLUCOSE-CAPILLARY: 127 mg/dL — AB (ref 70–99)
Glucose-Capillary: 105 mg/dL — ABNORMAL HIGH (ref 70–99)

## 2015-04-11 NOTE — Progress Notes (Signed)
Ms Marie Tanner told me she does not want to pursue vocational rehab at this time. Ms Marie Tanner is on leave from her job until August. [name] met with the hospital chaplain earlier this week. Shawni says this has been helpful. Will continue to monitor the patient throughout  the program.

## 2015-04-12 ENCOUNTER — Ambulatory Visit (HOSPITAL_COMMUNITY): Payer: BC Managed Care – PPO

## 2015-04-12 ENCOUNTER — Encounter (HOSPITAL_COMMUNITY): Payer: BC Managed Care – PPO

## 2015-04-15 ENCOUNTER — Encounter (HOSPITAL_COMMUNITY)
Admission: RE | Admit: 2015-04-15 | Discharge: 2015-04-15 | Disposition: A | Discharge status: Home / Self Care | Payer: BC Managed Care – PPO | Source: Ambulatory Visit | Attending: Cardiology | Admitting: Cardiology

## 2015-04-15 ENCOUNTER — Encounter (HOSPITAL_COMMUNITY): Payer: BC Managed Care – PPO

## 2015-04-15 ENCOUNTER — Ambulatory Visit (HOSPITAL_COMMUNITY): Payer: BC Managed Care – PPO

## 2015-04-15 DIAGNOSIS — Z48812 Encounter for surgical aftercare following surgery on the circulatory system: Secondary | ICD-10-CM | POA: Diagnosis not present

## 2015-04-15 LAB — GLUCOSE, CAPILLARY
GLUCOSE-CAPILLARY: 135 mg/dL — AB (ref 65–99)
GLUCOSE-CAPILLARY: 117 mg/dL — AB (ref 65–99)

## 2015-04-17 ENCOUNTER — Encounter (HOSPITAL_COMMUNITY)
Admission: RE | Admit: 2015-04-17 | Discharge: 2015-04-17 | Disposition: A | Discharge status: Home / Self Care | Payer: BC Managed Care – PPO | Source: Ambulatory Visit | Attending: Cardiology | Admitting: Cardiology

## 2015-04-17 ENCOUNTER — Ambulatory Visit (HOSPITAL_COMMUNITY): Payer: BC Managed Care – PPO

## 2015-04-17 ENCOUNTER — Encounter (HOSPITAL_COMMUNITY): Payer: BC Managed Care – PPO

## 2015-04-17 DIAGNOSIS — Z48812 Encounter for surgical aftercare following surgery on the circulatory system: Secondary | ICD-10-CM | POA: Diagnosis not present

## 2015-04-17 LAB — GLUCOSE, CAPILLARY
GLUCOSE-CAPILLARY: 98 mg/dL (ref 65–99)
Glucose-Capillary: 107 mg/dL — ABNORMAL HIGH (ref 65–99)
Glucose-Capillary: 119 mg/dL — ABNORMAL HIGH (ref 65–99)

## 2015-04-19 ENCOUNTER — Encounter (HOSPITAL_COMMUNITY)
Admission: RE | Admit: 2015-04-19 | Discharge: 2015-04-19 | Disposition: A | Discharge status: Home / Self Care | Payer: BC Managed Care – PPO | Source: Ambulatory Visit | Attending: Cardiology | Admitting: Cardiology

## 2015-04-19 ENCOUNTER — Ambulatory Visit (HOSPITAL_COMMUNITY): Payer: BC Managed Care – PPO

## 2015-04-19 ENCOUNTER — Encounter (HOSPITAL_COMMUNITY): Payer: BC Managed Care – PPO

## 2015-04-19 ENCOUNTER — Telehealth: Payer: Self-pay | Admitting: *Deleted

## 2015-04-19 DIAGNOSIS — Z48812 Encounter for surgical aftercare following surgery on the circulatory system: Secondary | ICD-10-CM | POA: Diagnosis not present

## 2015-04-19 LAB — GLUCOSE, CAPILLARY
GLUCOSE-CAPILLARY: 138 mg/dL — AB (ref 65–99)
GLUCOSE-CAPILLARY: 141 mg/dL — AB (ref 65–99)

## 2015-04-19 NOTE — Telephone Encounter (Signed)
[name] reviewed strips, continue same dose of medications and ok to leave [place]

## 2015-04-19 NOTE — Telephone Encounter (Signed)
-----   Message from Magda Kiel, RN sent at 04/19/2015  9:50 AM EDT ----- Regarding: Shortness of breath Good morning Dr Mare Ferrari.  Marie Tanner is complaining of shortness of breath with exertion since Wednesday with exertion. Ms Ronnald Ramp denies having any chest pain. Her Telemetry rhythm is Sinus. I will fax the [place] tracing over. Rate 84. Lungs fields clear. Sa02 100% on room air. CBG 138. No peripheral edema. Do you have any suggestions or recommendations?    Thanks   Verdis Frederickson  I encouraged Ms Ronnald Ramp to watch her dietary intake and to try to exercise on her non cardiac rehab days.  Do you want me to get a 12 lead she is here exercising.

## 2015-04-19 NOTE — Progress Notes (Signed)
Marie Tanner reported developing shortness of breath with exertion since Wednesday with exertion. Patient's weight is stable 81.4 kg. Telemetry rhythm Sinus 83. Oxygen saturation 100% on room air. Blood pressure stable. Lung fields clear upon ascultation. No peripheral edema noted. Patient denies having any chest pain. Dr Mare Ferrari notified. Dr Mare Ferrari reviewed today's ECG tracing. No new orders received. Patient encouraged to watch her portion sizes and to try to exercise on her non cardiac rehab days. Patient encouraged to let Dr Mare Ferrari know if she  Continues to have problems with shortness of breath. No complaint with exercise today. Will continue to monitor the patient throughout  the program.

## 2015-04-22 ENCOUNTER — Encounter (HOSPITAL_COMMUNITY): Payer: BC Managed Care – PPO

## 2015-04-22 ENCOUNTER — Encounter (HOSPITAL_COMMUNITY)
Admission: RE | Admit: 2015-04-22 | Discharge: 2015-04-22 | Disposition: A | Discharge status: Home / Self Care | Payer: BC Managed Care – PPO | Source: Ambulatory Visit | Attending: Cardiology | Admitting: Cardiology

## 2015-04-22 ENCOUNTER — Ambulatory Visit (HOSPITAL_COMMUNITY): Payer: BC Managed Care – PPO

## 2015-04-22 DIAGNOSIS — Z48812 Encounter for surgical aftercare following surgery on the circulatory system: Secondary | ICD-10-CM | POA: Diagnosis not present

## 2015-04-22 LAB — GLUCOSE, CAPILLARY: GLUCOSE-CAPILLARY: 111 mg/dL — AB (ref 65–99)

## 2015-04-24 ENCOUNTER — Encounter (HOSPITAL_COMMUNITY): Payer: BC Managed Care – PPO

## 2015-04-24 ENCOUNTER — Ambulatory Visit (HOSPITAL_COMMUNITY): Payer: BC Managed Care – PPO

## 2015-04-24 ENCOUNTER — Encounter (HOSPITAL_COMMUNITY)
Admission: RE | Admit: 2015-04-24 | Discharge: 2015-04-24 | Disposition: A | Discharge status: Home / Self Care | Payer: BC Managed Care – PPO | Source: Ambulatory Visit | Attending: Cardiology | Admitting: Cardiology

## 2015-04-24 DIAGNOSIS — Z48812 Encounter for surgical aftercare following surgery on the circulatory system: Secondary | ICD-10-CM | POA: Diagnosis not present

## 2015-04-25 ENCOUNTER — Emergency Department (HOSPITAL_COMMUNITY): Payer: BC Managed Care – PPO

## 2015-04-25 ENCOUNTER — Emergency Department (HOSPITAL_COMMUNITY)
Admission: EM | Admit: 2015-04-25 | Discharge: 2015-04-25 | Disposition: A | Discharge status: Home / Self Care | Payer: BC Managed Care – PPO | Attending: Emergency Medicine | Admitting: Emergency Medicine

## 2015-04-25 ENCOUNTER — Encounter (HOSPITAL_COMMUNITY): Payer: Self-pay | Admitting: Neurology

## 2015-04-25 DIAGNOSIS — Z86718 Personal history of other venous thrombosis and embolism: Secondary | ICD-10-CM | POA: Diagnosis not present

## 2015-04-25 DIAGNOSIS — Z8719 Personal history of other diseases of the digestive system: Secondary | ICD-10-CM | POA: Insufficient documentation

## 2015-04-25 DIAGNOSIS — M542 Cervicalgia: Secondary | ICD-10-CM | POA: Diagnosis not present

## 2015-04-25 DIAGNOSIS — Z79899 Other long term (current) drug therapy: Secondary | ICD-10-CM | POA: Diagnosis not present

## 2015-04-25 DIAGNOSIS — I2510 Atherosclerotic heart disease of native coronary artery without angina pectoris: Secondary | ICD-10-CM | POA: Insufficient documentation

## 2015-04-25 DIAGNOSIS — I252 Old myocardial infarction: Secondary | ICD-10-CM | POA: Diagnosis not present

## 2015-04-25 DIAGNOSIS — Z8701 Personal history of pneumonia (recurrent): Secondary | ICD-10-CM | POA: Diagnosis not present

## 2015-04-25 DIAGNOSIS — Z87828 Personal history of other (healed) physical injury and trauma: Secondary | ICD-10-CM | POA: Insufficient documentation

## 2015-04-25 DIAGNOSIS — H9203 Otalgia, bilateral: Secondary | ICD-10-CM | POA: Insufficient documentation

## 2015-04-25 DIAGNOSIS — E119 Type 2 diabetes mellitus without complications: Secondary | ICD-10-CM | POA: Insufficient documentation

## 2015-04-25 DIAGNOSIS — Z8709 Personal history of other diseases of the respiratory system: Secondary | ICD-10-CM | POA: Insufficient documentation

## 2015-04-25 DIAGNOSIS — R011 Cardiac murmur, unspecified: Secondary | ICD-10-CM | POA: Insufficient documentation

## 2015-04-25 DIAGNOSIS — Z87448 Personal history of other diseases of urinary system: Secondary | ICD-10-CM | POA: Insufficient documentation

## 2015-04-25 DIAGNOSIS — I10 Essential (primary) hypertension: Secondary | ICD-10-CM | POA: Diagnosis not present

## 2015-04-25 DIAGNOSIS — Z7982 Long term (current) use of aspirin: Secondary | ICD-10-CM | POA: Diagnosis not present

## 2015-04-25 DIAGNOSIS — K1120 Sialoadenitis, unspecified: Secondary | ICD-10-CM | POA: Diagnosis not present

## 2015-04-25 LAB — I-STAT TROPONIN, ED: Troponin i, poc: 0.01 ng/mL (ref 0.00–0.08)

## 2015-04-25 LAB — BASIC METABOLIC PANEL
Anion gap: 9 (ref 5–15)
BUN: 13 mg/dL (ref 6–20)
CALCIUM: 9.5 mg/dL (ref 8.9–10.3)
CO2: 24 mmol/L (ref 22–32)
Chloride: 102 mmol/L (ref 101–111)
Creatinine, Ser: 0.68 mg/dL (ref 0.44–1.00)
GLUCOSE: 162 mg/dL — AB (ref 65–99)
Potassium: 4.3 mmol/L (ref 3.5–5.1)
Sodium: 135 mmol/L (ref 135–145)

## 2015-04-25 LAB — CBC
HEMATOCRIT: 37.3 % (ref 36.0–46.0)
HEMOGLOBIN: 12 g/dL (ref 12.0–15.0)
MCH: 27.2 pg (ref 26.0–34.0)
MCHC: 32.2 g/dL (ref 30.0–36.0)
MCV: 84.6 fL (ref 78.0–100.0)
PLATELETS: 257 10*3/uL (ref 150–400)
RBC: 4.41 MIL/uL (ref 3.87–5.11)
RDW: 13.7 % (ref 11.5–15.5)
WBC: 5 10*3/uL (ref 4.0–10.5)

## 2015-04-25 MED ORDER — ACETAMINOPHEN 500 MG PO TABS
1000.0000 mg | ORAL_TABLET | Freq: Once | ORAL | Status: AC
  Administered 2015-04-25: 1000 mg via ORAL
  Filled 2015-04-25: qty 2

## 2015-04-25 MED ORDER — HYDROCODONE-ACETAMINOPHEN 2.5-500 MG PO TABS: 1.0000 | ORAL_TABLET | Freq: Four times a day (QID) | ORAL | Status: DC | PRN

## 2015-04-25 NOTE — ED Notes (Signed)
Pt reports since yesterday bilateral jaw pain and neck pain that is intermittently. Denies cp but reports feeling sob. Reports recent MI. Denies n/v. Yesterday had episode of diaphoresis. Pt is a x 4.

## 2015-04-25 NOTE — ED Provider Notes (Signed)
CSN: [id number]     Arrival date & time 04/25/15  1523 History   First MD Initiated Contact with Patient 04/25/15 1745     Chief Complaint  Patient presents with  . Jaw Pain  . Neck Pain     (Consider location/radiation/quality/duration/timing/severity/associated sxs/prior Treatment) HPI the patient states that she got an episode of throat pain yesterday that radiated to her jaws that lasted about an hour. Today she reports that she again got this painful feeling in her throat, radiating into her ears. She states it's burning in quality and made worse by swallowing. When she tried to swallow some water today and made an intense spasming feeling on the sides of her neck and below her jaws. She also has noted that she got a fullness in her ears and had some popping sounds. She denies she's had fever or chills. She denies she's had sinus congestion drainage or discharge. She does recall actually having had a bit of a sore throat several days preceding this and had not made much noted at the time. No associated chest pain or shortness of breath. No nausea or vomiting. The patient is recently status post MI and undergoing cardiac rehabilitation.  Past Medical History  Diagnosis Date  . Renal disorder   . Coronary artery disease   . Liver laceration 10/19/14  . Pulmonary edema 10/19/14  . DVT (deep venous thrombosis) 11/15    left femerol artery injury during resusciation.  [name] STEMI (ST elevation myocardial infarction) 10/19/14  . Hypertension   . High cholesterol   . Heart murmur   . HCAP (healthcare-associated pneumonia) 11/22/2014  . Sleep apnea     "suppose to wear mask but I don't" (11/22/2014)  . Type II diabetes mellitus   . History of blood transfusion 09/2014    related to MI  . GERD (gastroesophageal reflux disease)   . History of gout    Past Surgical History  Procedure Laterality Date  . Cesarean section  1980; 1982  . Laparoscopic abdominal exploration    . Liver laceration  repair  09/2014    related to CPR/notes 11/22/2014  . Tracheostomy  09/2014    "closed on it's own when they took it out"  . Femerol artery repair Right 09/2014    pt insists it was a right femoral artery repair on 11/22/2014  . Extracorporeal circulation    . Left ventricular assist device      pt is not aware of this hx on 11/22/2014  . Abdominal hysterectomy  ~ 2000  . Tubal ligation  1982  . Coronary angioplasty with stent placement  09/2014    "multiple"/notes 11/22/2014  . Application of wound vac  10/2014    "over my naval" (11/22/2014)   Family History  Problem Relation Age of Onset  . Diabetes Mother   . Hypertension Mother   . COPD Mother   . Heart attack Father   . Alzheimer's disease Father   . Diabetes Brother    History  Substance Use Topics  . Smoking status: Never Smoker   . Smokeless tobacco: Never Used  . Alcohol Use: No   OB History    Gravida Para Term Preterm AB TAB SAB Ectopic Multiple Living   2 2             Review of Systems 10 Systems reviewed and are negative for acute change except as noted in the HPI.  Allergies  Dilaudid; Epinephrine; and Lactose intolerance (gi)  Home  Medications   Prior to Admission medications   Medication Sig Start Date End Date Taking? Authorizing Provider  acetaminophen (TYLENOL) 500 MG tablet Take 1,000 mg by mouth 2 (two) times daily as needed (pain).    Yes Historical Provider, MD  aspirin EC 81 MG tablet Take 1 tablet (81 mg total) by mouth daily. 04/08/15  Yes Darlin Coco, MD  furosemide (LASIX) 40 MG tablet Take 40 mg by mouth daily at 12 noon.    Yes Historical Provider, MD  lisinopril (PRINIVIL,ZESTRIL) 10 MG tablet Take 10 mg by mouth daily.   Yes Historical Provider, MD  magnesium oxide (MAG-OX) 400 MG tablet Take 1,200 mg by mouth daily.    Yes Historical Provider, MD  Melatonin 3 MG CAPS Take 3 mg by mouth at bedtime as needed (sleep).   Yes Historical Provider, MD  metFORMIN (GLUCOPHAGE) 500 MG  tablet Take 500 mg by mouth daily with breakfast.    Yes Historical Provider, MD  metoprolol tartrate (LOPRESSOR) 25 MG tablet Take 12.5 mg by mouth every 12 (twelve) hours.    Yes Historical Provider, MD  nitroGLYCERIN (NITROSTAT) 0.4 MG SL tablet Place 0.4 mg under the tongue every 5 (five) minutes as needed for chest pain. Take every 5 minutes for max of 3 if no relief call [phone]   Yes Historical Provider, MD  potassium chloride SA (K-DUR,KLOR-CON) 20 MEQ tablet Take 20 mEq by mouth daily.    Yes Historical Provider, MD  sertraline (ZOLOFT) 50 MG tablet Take 50 mg by mouth daily.   Yes Historical Provider, MD  ticagrelor (BRILINTA) 90 MG TABS tablet Take 90 mg by mouth every 12 (twelve) hours.    Yes Historical Provider, MD  HYDROcodone-acetaminophen (VICODIN) 2.5-500 MG per tablet Take 1 tablet by mouth every 6 (six) hours as needed for pain. 04/25/15   Charlesetta Shanks, MD   BP 89/52 mmHg  Pulse 73  Temp(Src) 98.1 F (36.7 C) (Oral)  Resp 17  Ht 5\' 2"  (1.575 m)  Wt 175 lb (79.379 kg)  BMI 32.00 kg/m2  SpO2 98% Physical Exam  Constitutional: She is oriented to person, place, and time. She appears well-developed and well-nourished.  HENT:  Head: Normocephalic and atraumatic.  Right Ear: External ear normal.  Left Ear: External ear normal.  Nose: Nose normal.  Mouth/Throat: Oropharynx is clear and moist. No oropharyngeal exudate.  Eyes: EOM are normal. Pupils are equal, round, and reactive to light.  Neck: Neck supple.  Patient does have mild tenderness to palpation in the peritonsillar and sub mandibular salivary gland area. There is a small firm nodule on the left submandibular area which is indistinct and may be bony versus a nodule or calcification within the gland. The neck is supple without meningismus. There is no stridor.  Cardiovascular: Normal rate, regular rhythm, normal heart sounds and intact distal pulses.   Pulmonary/Chest: Effort normal and breath sounds normal. No stridor.   Abdominal: Soft. Bowel sounds are normal. She exhibits no distension. There is tenderness. There is rebound.  Musculoskeletal: Normal range of motion. She exhibits no edema.  Neurological: She is alert and oriented to person, place, and time. She has normal strength. Coordination normal. GCS eye subscore is 4. GCS verbal subscore is 5. GCS motor subscore is 6.  Skin: Skin is warm, dry and intact.  Psychiatric: She has a normal mood and affect.    ED Course  Procedures (including critical care time) Labs Review Labs Reviewed  BASIC METABOLIC PANEL - Abnormal; Notable  for the following:    Glucose, Bld 162 (*)    All other components within normal limits  CBC  I-STAT TROPOININ, ED    Imaging Review Dg Chest 2 View  04/25/2015   CLINICAL DATA:  Jaw and neck pain since yesterday. Shortness of breath.  EXAM: CHEST  2 VIEW  COMPARISON:  01/16/2015  FINDINGS: Subsegmental atelectasis in the right middle lobe. Lungs otherwise clear. Heart is normal size. No effusions. No acute bony abnormality.  IMPRESSION: Subsegmental atelectasis in the right middle lobe. No active disease.   Electronically Signed   By: Rolm Baptise M.D.   On: 04/25/2015 16:15   US Soft Tissue Head/neck  04/25/2015   CLINICAL DATA:  Palpable node versus bone irregularity.  EXAM: ULTRASOUND OF HEAD/NECK SOFT TISSUES  TECHNIQUE: Ultrasound examination of the head and neck soft tissues was performed in the area of clinical concern.  COMPARISON:  None.  FINDINGS: Provided grayscale images of the patient's palpable area of concern caudal to the left mandibular angle are negative for discrete solid or cystic mass.  IMPRESSION: No sonographic correlate for patient's palpable area of concern involving the left neck.   Electronically Signed   By: Sandi Mariscal M.D.   On: 04/25/2015 20:33     EKG Interpretation   Date/Time:  Thursday Apr 25 2015 15:46:49 EDT Ventricular Rate:  84 PR Interval:  168 QRS Duration: 62 QT Interval:   334 QTC Calculation: 394 R Axis:   48 Text Interpretation:  Normal sinus rhythm Cannot rule out Inferior infarct  , age undetermined Cannot rule out Anterior infarct , age undetermined  Abnormal ECG Confirmed by Johnney Killian, MD, Jeannie Done [phone]) on 04/25/2015 5:45:44  PM      MDM   Final diagnoses:  Cervical pain (neck)  Sialadenitis  Otalgia, bilateral   The patient's symptoms are concentrated symmetrically around the angles of the mandible and radiating into her ears. Description of a spasmodic pain and burning quality are suggestive of sial adenitis. There is no frank erythema or significant appreciable edema of the glands. The patient has not had a fever and is nontoxic in appearance without meningismus or pharyngeal erythema or swelling. At this time I do not identify an indication for anti-biotics. This does not sound cardiac ischemic in nature. There is no thoracic component of pain or meningeal component of pain. The patient reports that she can only tolerate very low doses of pain medications and thus will be given a hydrocodone 2.5 mg tablet. She is counseled to follow-up with her family doctor the beginning of the week and return to the emergency department should there be changing or worsening of symptomology.    Charlesetta Shanks, MD 04/25/15 2120

## 2015-04-25 NOTE — Discharge Instructions (Signed)
Suspected Sialadenitis Sialadenitis is an inflammation (soreness) of the salivary glands. The parotid is the main salivary gland. It lies behind the angle of the jaw below the ear. The saliva produced comes out of a tiny opening (duct) inside the cheek on either side. This is usually at the level of the upper back teeth. If it is swollen, the ear is pushed up and out. This helps tell this condition apart from a simple lymph gland infection (swollen glands) in the same area. Mumps has mostly disappeared since the start of immunization against mumps. Now the most common cause of parotitis is germ (bacterial) infection or inflammation of the lymphatics (the lymph channels). The other major salivary gland is located in the floor of the mouth. Smaller salivary glands are located in the mouth. This includes the:  Lips.  Lining of the mouth.  Pharynx.  Hard palate (front part of the roof of the mouth). The salivary glands do many things, including:  Lubrication.  Breaking down food.  Production of hormones and antibodies (to protect against germs which may cause illness).  Help with the sense of taste. ACUTE BACTERIAL SIALADENITIS This is a sudden inflammatory response to bacterial infection. This causes redness, pain, swelling and tenderness over the infected gland. In the past, it was common in dehydrated and debilitated patients often following an operation. It is now more commonly seen:  After radiotherapy.  In patients with poor immune systems. Treatment is:  The correction of fluid balance (rehydration).  Medicine that kill germs (antibiotics).  Pain relief. CHRONIC RECURRENT SIALADENITIS This refers to repeated episodes of discomfort and swelling of one of the salivary glands. It often occurs after eating. Chronic sialadenitis is usually less painful. It is associated with recurrent enlargement of a salivary gland, often following meals, and typically with an absence of redness. The  chronic form of the disease often is associated with conditions linked to decreased salivary flow, rather than dehydration (loss of body fluids). These conditions include:  A stone, or concretion, formed in the gallbladder, kidneys, or other parts of the body (calculi).  Salivary stasis.  A change in the fluid and electrolyte (the salts in your body fluids) makeup of the gland. It is treated with:  Gland massage.  Methods to stimulate the flow of saliva, (for example, lemon juice).  Antibiotics if required. Surgery to remove the gland is possible, but its benefits need to be balanced against risks.  VIRAL SIALADENITIS Several viruses infect the salivary glands. Some of these include the mumps virus that commonly infects the parotid gland. Other viruses causing problems are:  The HIV virus.  Herpes.  Some of the influenza ("flu") viruses. RECURRENT SIALADENITIS IN CHILDREN This condition is thought to be due to swelling or ballooning of the ducts. It results in the same symptoms as acute bacterial parotitis. It is usually caused by germs (bacteria). It is often treated using penicillin. It may get well without treatment. Surgery is usually not required. TUBERCULOUS SIALADENITIS The salivary glands may become infected with the same bacteria causing tuberculosis ("TB"). Treatment is with anti-tuberculous antibiotic therapy. OTHER UNCOMMON CAUSES OF SIALADENITIS   Sjogren's syndrome is a condition in which arthritis is associated with a decrease in activity of the glands of the body that produce saliva and tears. The diagnosis is made with blood tests or by examination of a piece of tissue from the inside of the lip. Some people with this condition are bothered by:  A dry mouth.  Intermittent salivary  gland enlargement.  Atypical mycobacteria is a germ similar to tuberculosis. It often infects children. It is often resistant to antibiotic treatment. It may require surgical treatment to  remove the infected salivary gland.  Actinomycosis is an infection of the parotid gland that may also involve the overlying skin. The diagnosis is made by detecting granules of sulphur produced by the bacteria on microscopic examination. Treatment is a prolonged course of penicillin for up to one year.  Nutritional causes include vitamin deficiencies and bulimia.  Diabetes and problems with your thyroid.  Obesity, cirrhosis, and malabsorption are some metabolic causes. HOME CARE INSTRUCTIONS   Apply ice bags every 2 hours for 15-20 minutes, while awake, to the sore gland for 24 hours, then as directed by your caregiver. Place the ice in a plastic bag with a towel around it to prevent frostbite to the skin.  Only take over-the-counter or prescription medicines for pain, discomfort, or fever as directed by your caregiver. SEEK IMMEDIATE MEDICAL CARE IF:   There is increased pain or swelling in your gland that is not controlled with medicine.  An oral temperature above 102 F (38.9 C) develops, not controlled by medicine.  You develop difficulty opening your mouth, swallowing, or speaking. Document Released: 05/08/2002 Document Revised: 02/08/2012 Document Reviewed: 07/02/2008 [facility] Patient Information 2015 [place], [place]. This information is not intended to replace advice given to you by your health care provider. Make sure you discuss any questions you have with your health care provider.

## 2015-04-25 NOTE — ED Notes (Addendum)
Patient transported to [facility] 

## 2015-04-26 ENCOUNTER — Encounter (HOSPITAL_COMMUNITY): Payer: BC Managed Care – PPO

## 2015-04-26 ENCOUNTER — Encounter (HOSPITAL_COMMUNITY)
Admission: RE | Admit: 2015-04-26 | Discharge: 2015-04-26 | Disposition: A | Discharge status: Home / Self Care | Payer: BC Managed Care – PPO | Source: Ambulatory Visit | Attending: Cardiology | Admitting: Cardiology

## 2015-04-26 ENCOUNTER — Telehealth (HOSPITAL_COMMUNITY): Payer: Self-pay | Admitting: *Deleted

## 2015-04-26 ENCOUNTER — Ambulatory Visit (HOSPITAL_COMMUNITY): Payer: BC Managed Care – PPO

## 2015-04-26 DIAGNOSIS — Z48812 Encounter for surgical aftercare following surgery on the circulatory system: Secondary | ICD-10-CM | POA: Diagnosis not present

## 2015-04-26 LAB — GLUCOSE, CAPILLARY: GLUCOSE-CAPILLARY: 166 mg/dL — AB (ref 65–99)

## 2015-04-26 NOTE — Progress Notes (Signed)
Patient reports going to the Emergency room with neck and and jaw pain yesterday. Ms Marie Tanner was not admitted to the hospital. I advised the Ms [name] not exercise this morning. Dr Eula Flax office called. Appointment made for today at 12:15. The ED physician did not think Ms [name] pain was cardiac in nature.

## 2015-05-01 ENCOUNTER — Encounter (HOSPITAL_COMMUNITY): Payer: BC Managed Care – PPO

## 2015-05-01 ENCOUNTER — Ambulatory Visit (HOSPITAL_COMMUNITY): Payer: BC Managed Care – PPO

## 2015-05-03 ENCOUNTER — Telehealth (HOSPITAL_COMMUNITY): Payer: Self-pay | Admitting: Family Medicine

## 2015-05-03 ENCOUNTER — Encounter (HOSPITAL_COMMUNITY): Payer: BC Managed Care – PPO

## 2015-05-03 ENCOUNTER — Ambulatory Visit (HOSPITAL_COMMUNITY): Payer: BC Managed Care – PPO

## 2015-05-06 ENCOUNTER — Encounter (HOSPITAL_COMMUNITY): Payer: BC Managed Care – PPO

## 2015-05-08 ENCOUNTER — Encounter (HOSPITAL_COMMUNITY): Payer: BC Managed Care – PPO

## 2015-05-08 ENCOUNTER — Encounter (HOSPITAL_COMMUNITY)
Admission: RE | Admit: 2015-05-08 | Discharge: 2015-05-08 | Disposition: A | Discharge status: Home / Self Care | Payer: BC Managed Care – PPO | Source: Ambulatory Visit | Attending: Cardiology | Admitting: Cardiology

## 2015-05-08 DIAGNOSIS — I252 Old myocardial infarction: Secondary | ICD-10-CM | POA: Insufficient documentation

## 2015-05-08 DIAGNOSIS — Z955 Presence of coronary angioplasty implant and graft: Secondary | ICD-10-CM | POA: Diagnosis not present

## 2015-05-08 DIAGNOSIS — Z48812 Encounter for surgical aftercare following surgery on the circulatory system: Secondary | ICD-10-CM | POA: Insufficient documentation

## 2015-05-08 DIAGNOSIS — E119 Type 2 diabetes mellitus without complications: Secondary | ICD-10-CM | POA: Diagnosis not present

## 2015-05-08 LAB — GLUCOSE, CAPILLARY
GLUCOSE-CAPILLARY: 138 mg/dL — AB (ref 65–99)
GLUCOSE-CAPILLARY: 91 mg/dL (ref 65–99)

## 2015-05-10 ENCOUNTER — Encounter (HOSPITAL_COMMUNITY): Payer: BC Managed Care – PPO

## 2015-05-13 ENCOUNTER — Encounter (HOSPITAL_COMMUNITY): Payer: BC Managed Care – PPO

## 2015-05-13 ENCOUNTER — Encounter (HOSPITAL_COMMUNITY)
Admission: RE | Admit: 2015-05-13 | Discharge: 2015-05-13 | Disposition: A | Discharge status: Home / Self Care | Payer: BC Managed Care – PPO | Source: Ambulatory Visit | Attending: Cardiology | Admitting: Cardiology

## 2015-05-13 DIAGNOSIS — Z48812 Encounter for surgical aftercare following surgery on the circulatory system: Secondary | ICD-10-CM | POA: Diagnosis not present

## 2015-05-13 LAB — GLUCOSE, CAPILLARY
GLUCOSE-CAPILLARY: 109 mg/dL — AB (ref 65–99)
Glucose-Capillary: 158 mg/dL — ABNORMAL HIGH (ref 65–99)

## 2015-05-15 ENCOUNTER — Encounter (HOSPITAL_COMMUNITY)
Admission: RE | Admit: 2015-05-15 | Discharge: 2015-05-15 | Disposition: A | Discharge status: Home / Self Care | Payer: BC Managed Care – PPO | Source: Ambulatory Visit | Attending: Cardiology | Admitting: Cardiology

## 2015-05-15 ENCOUNTER — Encounter (HOSPITAL_COMMUNITY): Payer: BC Managed Care – PPO

## 2015-05-15 DIAGNOSIS — Z48812 Encounter for surgical aftercare following surgery on the circulatory system: Secondary | ICD-10-CM | POA: Diagnosis not present

## 2015-05-15 LAB — GLUCOSE, CAPILLARY
Glucose-Capillary: 156 mg/dL — ABNORMAL HIGH (ref 65–99)
Glucose-Capillary: 117 mg/dL — ABNORMAL HIGH (ref 65–99)

## 2015-05-17 ENCOUNTER — Encounter (HOSPITAL_COMMUNITY): Payer: BC Managed Care – PPO

## 2015-05-17 ENCOUNTER — Encounter (HOSPITAL_COMMUNITY)
Admission: RE | Admit: 2015-05-17 | Discharge: 2015-05-17 | Disposition: A | Discharge status: Home / Self Care | Payer: BC Managed Care – PPO | Source: Ambulatory Visit | Attending: Cardiology | Admitting: Cardiology

## 2015-05-17 DIAGNOSIS — Z48812 Encounter for surgical aftercare following surgery on the circulatory system: Secondary | ICD-10-CM | POA: Diagnosis not present

## 2015-05-17 LAB — GLUCOSE, CAPILLARY
GLUCOSE-CAPILLARY: 90 mg/dL (ref 65–99)
Glucose-Capillary: 89 mg/dL (ref 65–99)
Glucose-Capillary: 116 mg/dL — ABNORMAL HIGH (ref 65–99)

## 2015-05-20 ENCOUNTER — Encounter (HOSPITAL_COMMUNITY): Payer: BC Managed Care – PPO

## 2015-05-20 ENCOUNTER — Encounter (HOSPITAL_COMMUNITY)
Admission: RE | Admit: 2015-05-20 | Discharge: 2015-05-20 | Disposition: A | Discharge status: Home / Self Care | Payer: BC Managed Care – PPO | Source: Ambulatory Visit | Attending: Cardiology | Admitting: Cardiology

## 2015-05-20 DIAGNOSIS — Z48812 Encounter for surgical aftercare following surgery on the circulatory system: Secondary | ICD-10-CM | POA: Diagnosis not present

## 2015-05-20 LAB — GLUCOSE, CAPILLARY: Glucose-Capillary: 124 mg/dL — ABNORMAL HIGH (ref 65–99)

## 2015-05-22 ENCOUNTER — Encounter (HOSPITAL_COMMUNITY)
Admission: RE | Admit: 2015-05-22 | Discharge: 2015-05-22 | Disposition: A | Discharge status: Home / Self Care | Payer: BC Managed Care – PPO | Source: Ambulatory Visit | Attending: Cardiology | Admitting: Cardiology

## 2015-05-22 ENCOUNTER — Encounter (HOSPITAL_COMMUNITY): Payer: BC Managed Care – PPO

## 2015-05-22 DIAGNOSIS — Z48812 Encounter for surgical aftercare following surgery on the circulatory system: Secondary | ICD-10-CM | POA: Diagnosis not present

## 2015-05-22 LAB — GLUCOSE, CAPILLARY: Glucose-Capillary: 159 mg/dL — ABNORMAL HIGH (ref 65–99)

## 2015-05-24 ENCOUNTER — Encounter (HOSPITAL_COMMUNITY): Payer: BC Managed Care – PPO

## 2015-05-24 ENCOUNTER — Encounter (HOSPITAL_COMMUNITY)
Admission: RE | Admit: 2015-05-24 | Discharge: 2015-05-24 | Disposition: A | Discharge status: Home / Self Care | Payer: BC Managed Care – PPO | Source: Ambulatory Visit | Attending: Cardiology | Admitting: Cardiology

## 2015-05-24 DIAGNOSIS — Z48812 Encounter for surgical aftercare following surgery on the circulatory system: Secondary | ICD-10-CM | POA: Diagnosis not present

## 2015-05-27 ENCOUNTER — Encounter (HOSPITAL_COMMUNITY): Payer: BC Managed Care – PPO

## 2015-05-27 ENCOUNTER — Encounter (HOSPITAL_COMMUNITY)
Admission: RE | Admit: 2015-05-27 | Discharge: 2015-05-27 | Disposition: A | Discharge status: Home / Self Care | Payer: BC Managed Care – PPO | Source: Ambulatory Visit | Attending: Cardiology | Admitting: Cardiology

## 2015-05-27 DIAGNOSIS — Z48812 Encounter for surgical aftercare following surgery on the circulatory system: Secondary | ICD-10-CM | POA: Diagnosis not present

## 2015-05-27 LAB — GLUCOSE, CAPILLARY
GLUCOSE-CAPILLARY: 116 mg/dL — AB (ref 65–99)
GLUCOSE-CAPILLARY: 161 mg/dL — AB (ref 65–99)

## 2015-05-29 ENCOUNTER — Encounter (HOSPITAL_COMMUNITY): Payer: BC Managed Care – PPO

## 2015-05-29 ENCOUNTER — Encounter (HOSPITAL_COMMUNITY)
Admission: RE | Admit: 2015-05-29 | Discharge: 2015-05-29 | Disposition: A | Discharge status: Home / Self Care | Payer: BC Managed Care – PPO | Source: Ambulatory Visit | Attending: Cardiology | Admitting: Cardiology

## 2015-05-29 DIAGNOSIS — Z48812 Encounter for surgical aftercare following surgery on the circulatory system: Secondary | ICD-10-CM | POA: Diagnosis not present

## 2015-05-29 LAB — GLUCOSE, CAPILLARY: Glucose-Capillary: 106 mg/dL — ABNORMAL HIGH (ref 65–99)

## 2015-05-30 NOTE — Progress Notes (Signed)
  30 day Psychosocial followup assessment  Patient psychosocial assessment reveals no barriers to cardiac rehab participation.  Psychosocial areas that are affecting patient's rehab experience include concerns about family and emotional issues as evidenced by previous depression .  Patient  does continue to exhibit positive coping skills to deal with psychosocial concerns.  Patient does  feel making progress towards cardiac rehab goals.  Patient reports health and activity level have  improved in the past 30 days as evidenced by patient's reports of increased ability to preform ADL's. . Patient reports feeling positive about current and projected progress toward cardiac rehab goals.  Patient's rate of progress towards goals is good.  Plan of action to help patient continue to work towards rehab goals include exercising on non exercise days .  Will continue to monitor and evaluate progress toward psychosocial goals.   Goals in progress  Help patient work toward returning to meaningful activities that improve patient's quality of life and are attainable. Cyniah has met with the hospital chaplain and has found this helpful. An appointment was made for Yareli to meet with Mikki Santee in the near future.

## 2015-05-31 ENCOUNTER — Encounter (HOSPITAL_COMMUNITY): Payer: BC Managed Care – PPO

## 2015-05-31 ENCOUNTER — Encounter (HOSPITAL_COMMUNITY)
Admission: RE | Admit: 2015-05-31 | Discharge: 2015-05-31 | Disposition: A | Discharge status: Home / Self Care | Payer: BC Managed Care – PPO | Source: Ambulatory Visit | Attending: Cardiology | Admitting: Cardiology

## 2015-05-31 DIAGNOSIS — E119 Type 2 diabetes mellitus without complications: Secondary | ICD-10-CM | POA: Insufficient documentation

## 2015-05-31 DIAGNOSIS — Z955 Presence of coronary angioplasty implant and graft: Secondary | ICD-10-CM | POA: Insufficient documentation

## 2015-05-31 DIAGNOSIS — Z48812 Encounter for surgical aftercare following surgery on the circulatory system: Secondary | ICD-10-CM | POA: Diagnosis not present

## 2015-05-31 DIAGNOSIS — I252 Old myocardial infarction: Secondary | ICD-10-CM | POA: Diagnosis not present

## 2015-05-31 LAB — GLUCOSE, CAPILLARY: GLUCOSE-CAPILLARY: 117 mg/dL — AB (ref 65–99)

## 2015-06-05 ENCOUNTER — Observation Stay (HOSPITAL_COMMUNITY)
Admission: EM | Admit: 2015-06-05 | Discharge: 2015-06-06 | Disposition: A | Discharge status: Home / Self Care | Payer: BC Managed Care – PPO | Attending: Internal Medicine | Admitting: Internal Medicine

## 2015-06-05 ENCOUNTER — Encounter (HOSPITAL_COMMUNITY): Payer: BC Managed Care – PPO

## 2015-06-05 ENCOUNTER — Emergency Department (HOSPITAL_COMMUNITY): Payer: BC Managed Care – PPO

## 2015-06-05 DIAGNOSIS — I340 Nonrheumatic mitral (valve) insufficiency: Secondary | ICD-10-CM | POA: Diagnosis present

## 2015-06-05 DIAGNOSIS — I252 Old myocardial infarction: Secondary | ICD-10-CM | POA: Insufficient documentation

## 2015-06-05 DIAGNOSIS — Z86718 Personal history of other venous thrombosis and embolism: Secondary | ICD-10-CM | POA: Diagnosis not present

## 2015-06-05 DIAGNOSIS — K219 Gastro-esophageal reflux disease without esophagitis: Secondary | ICD-10-CM | POA: Diagnosis not present

## 2015-06-05 DIAGNOSIS — Z8709 Personal history of other diseases of the respiratory system: Secondary | ICD-10-CM | POA: Diagnosis not present

## 2015-06-05 DIAGNOSIS — I82409 Acute embolism and thrombosis of unspecified deep veins of unspecified lower extremity: Secondary | ICD-10-CM | POA: Diagnosis present

## 2015-06-05 DIAGNOSIS — I10 Essential (primary) hypertension: Secondary | ICD-10-CM | POA: Diagnosis not present

## 2015-06-05 DIAGNOSIS — F32A Depression, unspecified: Secondary | ICD-10-CM | POA: Diagnosis present

## 2015-06-05 DIAGNOSIS — R0600 Dyspnea, unspecified: Secondary | ICD-10-CM | POA: Diagnosis present

## 2015-06-05 DIAGNOSIS — I519 Heart disease, unspecified: Secondary | ICD-10-CM | POA: Diagnosis present

## 2015-06-05 DIAGNOSIS — Z79899 Other long term (current) drug therapy: Secondary | ICD-10-CM | POA: Insufficient documentation

## 2015-06-05 DIAGNOSIS — Z7901 Long term (current) use of anticoagulants: Secondary | ICD-10-CM | POA: Diagnosis not present

## 2015-06-05 DIAGNOSIS — R011 Cardiac murmur, unspecified: Secondary | ICD-10-CM | POA: Diagnosis not present

## 2015-06-05 DIAGNOSIS — Z87448 Personal history of other diseases of urinary system: Secondary | ICD-10-CM | POA: Insufficient documentation

## 2015-06-05 DIAGNOSIS — Z7982 Long term (current) use of aspirin: Secondary | ICD-10-CM | POA: Insufficient documentation

## 2015-06-05 DIAGNOSIS — I259 Chronic ischemic heart disease, unspecified: Secondary | ICD-10-CM | POA: Diagnosis present

## 2015-06-05 DIAGNOSIS — Z87828 Personal history of other (healed) physical injury and trauma: Secondary | ICD-10-CM | POA: Insufficient documentation

## 2015-06-05 DIAGNOSIS — E119 Type 2 diabetes mellitus without complications: Secondary | ICD-10-CM | POA: Insufficient documentation

## 2015-06-05 DIAGNOSIS — Z8669 Personal history of other diseases of the nervous system and sense organs: Secondary | ICD-10-CM | POA: Diagnosis not present

## 2015-06-05 DIAGNOSIS — Z9861 Coronary angioplasty status: Secondary | ICD-10-CM | POA: Diagnosis not present

## 2015-06-05 DIAGNOSIS — I2510 Atherosclerotic heart disease of native coronary artery without angina pectoris: Secondary | ICD-10-CM | POA: Diagnosis not present

## 2015-06-05 DIAGNOSIS — E785 Hyperlipidemia, unspecified: Secondary | ICD-10-CM | POA: Insufficient documentation

## 2015-06-05 DIAGNOSIS — R079 Chest pain, unspecified: Secondary | ICD-10-CM | POA: Diagnosis present

## 2015-06-05 DIAGNOSIS — E780 Pure hypercholesterolemia: Secondary | ICD-10-CM | POA: Insufficient documentation

## 2015-06-05 DIAGNOSIS — Z8739 Personal history of other diseases of the musculoskeletal system and connective tissue: Secondary | ICD-10-CM | POA: Insufficient documentation

## 2015-06-05 DIAGNOSIS — R0602 Shortness of breath: Secondary | ICD-10-CM | POA: Insufficient documentation

## 2015-06-05 DIAGNOSIS — I5042 Chronic combined systolic (congestive) and diastolic (congestive) heart failure: Secondary | ICD-10-CM | POA: Diagnosis present

## 2015-06-05 DIAGNOSIS — Z8701 Personal history of pneumonia (recurrent): Secondary | ICD-10-CM | POA: Insufficient documentation

## 2015-06-05 DIAGNOSIS — F329 Major depressive disorder, single episode, unspecified: Secondary | ICD-10-CM | POA: Diagnosis present

## 2015-06-05 DIAGNOSIS — I240 Acute coronary thrombosis not resulting in myocardial infarction: Secondary | ICD-10-CM | POA: Diagnosis present

## 2015-06-05 DIAGNOSIS — R197 Diarrhea, unspecified: Secondary | ICD-10-CM | POA: Diagnosis present

## 2015-06-05 HISTORY — DX: Major depressive disorder, single episode, unspecified: F32.9

## 2015-06-05 HISTORY — DX: Depression, unspecified: F32.A

## 2015-06-05 LAB — BASIC METABOLIC PANEL
ANION GAP: 8 (ref 5–15)
BUN: 14 mg/dL (ref 6–20)
CHLORIDE: 99 mmol/L — AB (ref 101–111)
CO2: 27 mmol/L (ref 22–32)
CREATININE: 0.81 mg/dL (ref 0.44–1.00)
Calcium: 9.3 mg/dL (ref 8.9–10.3)
GFR calc Af Amer: >60 mL/min (ref >60)
Glucose, Bld: 118 mg/dL — ABNORMAL HIGH (ref 65–99)
Potassium: 4 mmol/L (ref 3.5–5.1)
Sodium: 134 mmol/L — ABNORMAL LOW (ref 135–145)

## 2015-06-05 LAB — CBC
HCT: 36.4 % (ref 36.0–46.0)
Hemoglobin: 12 g/dL (ref 12.0–15.0)
MCH: 27.4 pg (ref 26.0–34.0)
MCHC: 33 g/dL (ref 30.0–36.0)
MCV: 83.1 fL (ref 78.0–100.0)
PLATELETS: 270 10*3/uL (ref 150–400)
RBC: 4.38 MIL/uL (ref 3.87–5.11)
RDW: 13.5 % (ref 11.5–15.5)
WBC: 8.7 10*3/uL (ref 4.0–10.5)

## 2015-06-05 LAB — BRAIN NATRIURETIC PEPTIDE: B Natriuretic Peptide: 205.6 pg/mL — ABNORMAL HIGH (ref 0.0–100.0)

## 2015-06-05 LAB — I-STAT TROPONIN, ED: TROPONIN I, POC: 0 ng/mL (ref 0.00–0.08)

## 2015-06-05 MED ORDER — ASPIRIN 81 MG PO CHEW
243.0000 mg | CHEWABLE_TABLET | Freq: Once | ORAL | Status: AC
  Administered 2015-06-05: 243 mg via ORAL
  Filled 2015-06-05: qty 3

## 2015-06-05 MED ORDER — NITROGLYCERIN 2 % TD OINT
1.0000 [in_us] | TOPICAL_OINTMENT | Freq: Once | TRANSDERMAL | Status: AC
  Administered 2015-06-05: 1 [in_us] via TOPICAL
  Filled 2015-06-05: qty 1

## 2015-06-05 NOTE — ED Notes (Signed)
Pt reports cp onset today radiating to back. Hx of MI last year. Pt took Mylanta thinking this was heartburn- but no relief. Pt also reports headache and pain in R arm. Pt took 3 nitro's pta reprots some relief with nitro.

## 2015-06-05 NOTE — ED Provider Notes (Signed)
CSN: [id number]     Arrival date & time 06/05/15  2014 History   First MD Initiated Contact with Patient 06/05/15 2136     Chief Complaint  Patient presents with  . Chest Pain     (Consider location/radiation/quality/duration/timing/severity/associated sxs/prior Treatment) HPI Comments: Patient is a 59 yo F PMHx significant for CAD, STEMI status post 2 stent placement November 2015, hypertension, hyperlipidemia, DM presenting to the emergency department for acute onset chest pain with radiation to arm with associated shortness of breath. Chest pain began at 10:30 AM this morning and has been constant. She took 3 sublingual nitroglycerin with mild improvement, no resolution. Last echocardiogram December 2015, EF 40%. Followed by [name]. Patient had a cardiac catheterization at [facility], following stent placement had cardiac arrest with resuscitation.   Past Medical History  Diagnosis Date  . Renal disorder   . Coronary artery disease   . Liver laceration 10/19/14  . Pulmonary edema 10/19/14  . DVT (deep venous thrombosis) 11/15    left femerol artery injury during resusciation.  [name] STEMI (ST elevation myocardial infarction) 10/19/14  . Hypertension   . High cholesterol   . Heart murmur   . HCAP (healthcare-associated pneumonia) 11/22/2014  . Sleep apnea     "suppose to wear mask but I don't" (11/22/2014)  . Type II diabetes mellitus   . History of blood transfusion 09/2014    related to MI  . GERD (gastroesophageal reflux disease)   . History of gout    Past Surgical History  Procedure Laterality Date  . Cesarean section  1980; 1982  . Laparoscopic abdominal exploration    . Liver laceration repair  09/2014    related to CPR/notes 11/22/2014  . Tracheostomy  09/2014    "closed on it's own when they took it out"  . Femerol artery repair Right 09/2014    pt insists it was a right femoral artery repair on 11/22/2014  . Extracorporeal circulation    . Left  ventricular assist device      pt is not aware of this hx on 11/22/2014  . Abdominal hysterectomy  ~ 2000  . Tubal ligation  1982  . Coronary angioplasty with stent placement  09/2014    "multiple"/notes 11/22/2014  . Application of wound vac  10/2014    "over my naval" (11/22/2014)   Family History  Problem Relation Age of Onset  . Diabetes Mother   . Hypertension Mother   . COPD Mother   . Heart attack Father   . Alzheimer's disease Father   . Diabetes Brother    History  Substance Use Topics  . Smoking status: Never Smoker   . Smokeless tobacco: Never Used  . Alcohol Use: No   OB History    Gravida Para Term Preterm AB TAB SAB Ectopic Multiple Living   2 2             Review of Systems  Respiratory: Positive for shortness of breath.   Cardiovascular: Positive for chest pain.  All other systems reviewed and are negative.     Allergies  Dilaudid; Epinephrine; and Lactose intolerance (gi)  Home Medications   Prior to Admission medications   Medication Sig Start Date End Date Taking? Authorizing Provider  acetaminophen (TYLENOL) 500 MG tablet Take 1,000 mg by mouth 2 (two) times daily as needed (pain).    Yes Historical Provider, MD  aspirin EC 81 MG tablet Take 1 tablet (81 mg total) by mouth daily. 04/08/15  Yes Darlin Coco, MD  furosemide (LASIX) 40 MG tablet Take 40 mg by mouth daily at 12 noon.    Yes Historical Provider, MD  lisinopril (PRINIVIL,ZESTRIL) 10 MG tablet Take 10 mg by mouth daily.   Yes Historical Provider, MD  magnesium oxide (MAG-OX) 400 MG tablet Take 1,200 mg by mouth daily.    Yes Historical Provider, MD  metFORMIN (GLUCOPHAGE) 500 MG tablet Take 500 mg by mouth See admin instructions. Takes med only on Sun, Tues, Thurs and Sat Takes skips meds on Mon, wed, and fri   Yes Historical Provider, MD  metoprolol tartrate (LOPRESSOR) 25 MG tablet Take 12.5 mg by mouth every 12 (twelve) hours.    Yes Historical Provider, MD  nitroGLYCERIN  (NITROSTAT) 0.4 MG SL tablet Place 0.4 mg under the tongue every 5 (five) minutes as needed for chest pain. Take every 5 minutes for max of 3 if no relief call [phone]   Yes Historical Provider, MD  potassium chloride SA (K-DUR,KLOR-CON) 20 MEQ tablet Take 20 mEq by mouth daily.    Yes Historical Provider, MD  ticagrelor (BRILINTA) 90 MG TABS tablet Take 90 mg by mouth every 12 (twelve) hours.    Yes Historical Provider, MD  HYDROcodone-acetaminophen (VICODIN) 2.5-500 MG per tablet Take 1 tablet by mouth every 6 (six) hours as needed for pain. 04/25/15   Charlesetta Shanks, MD  sertraline (ZOLOFT) 50 MG tablet Take 50 mg by mouth daily.    Historical Provider, MD   BP 104/64 mmHg  Pulse 101  Temp(Src) 98.4 F (36.9 C) (Oral)  Resp 18  Ht 5\' 2"  (1.575 m)  Wt 177 lb 8 oz (80.513 kg)  BMI 32.46 kg/m2  SpO2 97% Physical Exam  Constitutional: She is oriented to person, place, and time. She appears well-developed and well-nourished.  HENT:  Head: Normocephalic and atraumatic.  Eyes: Conjunctivae are normal.  Neck: Neck supple.  Cardiovascular: Normal rate and regular rhythm.   Murmur heard. Pulmonary/Chest: Effort normal and breath sounds normal. No respiratory distress. She exhibits no tenderness.  Abdominal: Soft.  Musculoskeletal: Normal range of motion. She exhibits no edema.  Neurological: She is alert and oriented to person, place, and time.  Skin: Skin is warm and dry.  Nursing note and vitals reviewed.   ED Course  Procedures (including critical care time) Medications  aspirin chewable tablet 243 mg (243 mg Oral Given 06/05/15 2221)  nitroGLYCERIN (NITROGLYN) 2 % ointment 1 inch (1 inch Topical Given 06/05/15 2221)    Labs Review Labs Reviewed  BASIC METABOLIC PANEL - Abnormal; Notable for the following:    Sodium 134 (*)    Chloride 99 (*)    Glucose, Bld 118 (*)    All other components within normal limits  BRAIN NATRIURETIC PEPTIDE - Abnormal; Notable for the following:    B  Natriuretic Peptide 205.6 (*)    All other components within normal limits  CBC  I-STAT TROPOININ, ED    Imaging Review Dg Chest 2 View  06/05/2015   CLINICAL DATA:  Left-sided chest pain and right arm numbness beginning today.  EXAM: CHEST  2 VIEW  COMPARISON:  PA and lateral chest 04/25/2015.  CT chest 01/17/2015.  FINDINGS: Heart size and mediastinal contours are within normal limits. Both lungs are clear. Visualized skeletal structures are unremarkable.  IMPRESSION: Negative exam.   Electronically Signed   By: Inge Rise M.D.   On: 06/05/2015 21:12     EKG Interpretation   Date/Time:  Wednesday June 05 2015 20:20:17 EDT Ventricular Rate:  106 PR Interval:  154 QRS Duration: 64 QT Interval:  316 QTC Calculation: 419 R Axis:   70 Text Interpretation:  No significant change since last tracing Confirmed  by Ashok Cordia  MD, Lennette Bihari ([id number]) on 06/05/2015 9:57:00 PM      MDM   Final diagnoses:  Acute chest pain    Filed Vitals:   06/05/15 2345  BP: 104/64  Pulse: 101  Temp:   Resp: 18   Afebrile, NAD, non-toxic appearing, AAOx4. Concern for cardiac etiology of Chest Pain. Hospitalist has been consulted and will see patient in the ED for likely admit. Pt does not meet criteria for CP protocol and a further evaluation is recommended. Pt has been re-evaluated prior to consult and VSS, NAD, heart RRR, pain 0/10, lungs CTAB. No acute abnormalities found on EKG and first round of cardiac enzymes negative. This case was discussed with Dr. Ashok Cordia who has seen the patient and agrees with plan to admit.       Baron Sane, PA-C 06/06/15 0025  Lajean Saver, MD 06/10/15 [phone]

## 2015-06-06 ENCOUNTER — Inpatient Hospital Stay (HOSPITAL_COMMUNITY): Payer: BC Managed Care – PPO

## 2015-06-06 ENCOUNTER — Encounter (HOSPITAL_COMMUNITY): Payer: Self-pay

## 2015-06-06 ENCOUNTER — Ambulatory Visit (HOSPITAL_COMMUNITY): Payer: BC Managed Care – PPO

## 2015-06-06 DIAGNOSIS — I10 Essential (primary) hypertension: Secondary | ICD-10-CM

## 2015-06-06 DIAGNOSIS — I519 Heart disease, unspecified: Secondary | ICD-10-CM | POA: Diagnosis not present

## 2015-06-06 DIAGNOSIS — R197 Diarrhea, unspecified: Secondary | ICD-10-CM | POA: Diagnosis not present

## 2015-06-06 DIAGNOSIS — R079 Chest pain, unspecified: Secondary | ICD-10-CM | POA: Diagnosis not present

## 2015-06-06 DIAGNOSIS — F329 Major depressive disorder, single episode, unspecified: Secondary | ICD-10-CM | POA: Diagnosis present

## 2015-06-06 DIAGNOSIS — R0789 Other chest pain: Secondary | ICD-10-CM | POA: Diagnosis not present

## 2015-06-06 DIAGNOSIS — I340 Nonrheumatic mitral (valve) insufficiency: Secondary | ICD-10-CM

## 2015-06-06 DIAGNOSIS — I252 Old myocardial infarction: Secondary | ICD-10-CM | POA: Diagnosis not present

## 2015-06-06 DIAGNOSIS — E119 Type 2 diabetes mellitus without complications: Secondary | ICD-10-CM

## 2015-06-06 DIAGNOSIS — F32A Depression, unspecified: Secondary | ICD-10-CM | POA: Diagnosis present

## 2015-06-06 DIAGNOSIS — R0602 Shortness of breath: Secondary | ICD-10-CM | POA: Diagnosis not present

## 2015-06-06 DIAGNOSIS — R011 Cardiac murmur, unspecified: Secondary | ICD-10-CM | POA: Diagnosis not present

## 2015-06-06 LAB — COMPREHENSIVE METABOLIC PANEL
ALK PHOS: 107 U/L (ref 38–126)
ALT: 73 U/L — ABNORMAL HIGH (ref 14–54)
AST: 103 U/L — AB (ref 15–41)
Albumin: 3.1 g/dL — ABNORMAL LOW (ref 3.5–5.0)
Anion gap: 8 (ref 5–15)
BILIRUBIN TOTAL: 0.9 mg/dL (ref 0.3–1.2)
BUN: 11 mg/dL (ref 6–20)
CO2: 23 mmol/L (ref 22–32)
Calcium: 8.7 mg/dL — ABNORMAL LOW (ref 8.9–10.3)
Chloride: 100 mmol/L — ABNORMAL LOW (ref 101–111)
Creatinine, Ser: 0.64 mg/dL (ref 0.44–1.00)
GFR calc Af Amer: >60 mL/min (ref >60)
GLUCOSE: 100 mg/dL — AB (ref 65–99)
Potassium: 3.9 mmol/L (ref 3.5–5.1)
Sodium: 131 mmol/L — ABNORMAL LOW (ref 135–145)
Total Protein: 8 g/dL (ref 6.5–8.1)

## 2015-06-06 LAB — CBC
HEMATOCRIT: 33.6 % — AB (ref 36.0–46.0)
Hemoglobin: 10.9 g/dL — ABNORMAL LOW (ref 12.0–15.0)
MCH: 26.8 pg (ref 26.0–34.0)
MCHC: 32.4 g/dL (ref 30.0–36.0)
MCV: 82.8 fL (ref 78.0–100.0)
Platelets: 242 10*3/uL (ref 150–400)
RBC: 4.06 MIL/uL (ref 3.87–5.11)
RDW: 13.7 % (ref 11.5–15.5)
WBC: 5 10*3/uL (ref 4.0–10.5)

## 2015-06-06 LAB — LIPID PANEL
Cholesterol: 172 mg/dL (ref 0–200)
HDL: 49 mg/dL (ref >40)
LDL Cholesterol: 114 mg/dL — ABNORMAL HIGH (ref 0–99)
Total CHOL/HDL Ratio: 3.5 RATIO
Triglycerides: 44 mg/dL (ref <150)
VLDL: 9 mg/dL (ref 0–40)

## 2015-06-06 LAB — TROPONIN I
Troponin I: <0.03 ng/mL (ref <0.031)
Troponin I: <0.03 ng/mL (ref <0.031)
Troponin I: <0.03 ng/mL (ref <0.031)

## 2015-06-06 LAB — PROTIME-INR
INR: 1.2 (ref 0.00–1.49)
Prothrombin Time: 15.3 seconds — ABNORMAL HIGH (ref 11.6–15.2)

## 2015-06-06 LAB — GLUCOSE, CAPILLARY
GLUCOSE-CAPILLARY: 103 mg/dL — AB (ref 65–99)
Glucose-Capillary: 89 mg/dL (ref 65–99)
Glucose-Capillary: 194 mg/dL — ABNORMAL HIGH (ref 65–99)

## 2015-06-06 LAB — APTT: aPTT: 29 seconds (ref 24–37)

## 2015-06-06 LAB — MRSA PCR SCREENING: MRSA by PCR: NEGATIVE

## 2015-06-06 MED ORDER — ONDANSETRON HCL 4 MG PO TABS: 4.0000 mg | ORAL_TABLET | Freq: Four times a day (QID) | ORAL | Status: DC | PRN

## 2015-06-06 MED ORDER — ONDANSETRON HCL 4 MG/2ML IJ SOLN: 4.0000 mg | Freq: Four times a day (QID) | INTRAMUSCULAR | Status: DC | PRN

## 2015-06-06 MED ORDER — MAGNESIUM OXIDE 400 (241.3 MG) MG PO TABS
1200.0000 mg | ORAL_TABLET | Freq: Every day | ORAL | Status: DC
  Administered 2015-06-06: 1200 mg via ORAL
  Filled 2015-06-06: qty 3

## 2015-06-06 MED ORDER — ASPIRIN 81 MG PO CHEW
324.0000 mg | CHEWABLE_TABLET | Freq: Every day | ORAL | Status: DC
  Administered 2015-06-06: 324 mg via ORAL
  Filled 2015-06-06: qty 4

## 2015-06-06 MED ORDER — LISINOPRIL 10 MG PO TABS: 10.0000 mg | ORAL_TABLET | Freq: Every day | ORAL | Status: DC

## 2015-06-06 MED ORDER — SODIUM CHLORIDE 0.9 % IJ SOLN
3.0000 mL | Freq: Two times a day (BID) | INTRAMUSCULAR | Status: DC
Start: 2015-06-06 — End: 2015-06-06
  Administered 2015-06-06 (×2): 3 mL via INTRAVENOUS

## 2015-06-06 MED ORDER — HEPARIN SODIUM (PORCINE) 5000 UNIT/ML IJ SOLN: 5000.0000 [IU] | Freq: Three times a day (TID) | INTRAMUSCULAR | Status: DC

## 2015-06-06 MED ORDER — TICAGRELOR 90 MG PO TABS
90.0000 mg | ORAL_TABLET | Freq: Two times a day (BID) | ORAL | Status: DC
  Administered 2015-06-06: 90 mg via ORAL
  Filled 2015-06-06: qty 1

## 2015-06-06 MED ORDER — ATORVASTATIN CALCIUM 40 MG PO TABS
40.0000 mg | ORAL_TABLET | Freq: Every day | ORAL | Status: DC
  Administered 2015-06-06: 40 mg via ORAL
  Filled 2015-06-06: qty 1

## 2015-06-06 MED ORDER — ACETAMINOPHEN 500 MG PO TABS
1000.0000 mg | ORAL_TABLET | Freq: Two times a day (BID) | ORAL | Status: DC | PRN
  Administered 2015-06-06: 1000 mg via ORAL
  Filled 2015-06-06: qty 2

## 2015-06-06 MED ORDER — TRAMADOL HCL 50 MG PO TABS
50.0000 mg | ORAL_TABLET | Freq: Four times a day (QID) | ORAL | Status: DC
  Administered 2015-06-06 (×2): 50 mg via ORAL
  Filled 2015-06-06 (×3): qty 1

## 2015-06-06 MED ORDER — INSULIN ASPART 100 UNIT/ML ~~LOC~~ SOLN
0.0000 [IU] | Freq: Three times a day (TID) | SUBCUTANEOUS | Status: DC
  Administered 2015-06-06: 2 [IU] via SUBCUTANEOUS

## 2015-06-06 MED ORDER — ATORVASTATIN CALCIUM 40 MG PO TABS: 40.0000 mg | ORAL_TABLET | Freq: Every day | ORAL | Status: DC

## 2015-06-06 MED ORDER — TRAMADOL HCL 50 MG PO TABS
50.0000 mg | ORAL_TABLET | Freq: Four times a day (QID) | ORAL | Status: DC
Start: 2015-06-06 — End: 2015-06-06
  Administered 2015-06-06: 50 mg via ORAL
  Filled 2015-06-06: qty 1

## 2015-06-06 MED ORDER — NITROGLYCERIN 0.4 MG SL SUBL: 0.4000 mg | SUBLINGUAL_TABLET | SUBLINGUAL | Status: DC | PRN

## 2015-06-06 MED ORDER — SODIUM CHLORIDE 0.9 % IV SOLN
INTRAVENOUS | Status: DC
  Administered 2015-06-06: 03:00:00 via INTRAVENOUS

## 2015-06-06 MED ORDER — SERTRALINE HCL 50 MG PO TABS
50.0000 mg | ORAL_TABLET | Freq: Every day | ORAL | Status: DC
Start: 2015-06-06 — End: 2015-06-06
  Filled 2015-06-06: qty 1

## 2015-06-06 MED ORDER — TICAGRELOR 90 MG PO TABS: 90.0000 mg | ORAL_TABLET | Freq: Two times a day (BID) | ORAL | Status: DC

## 2015-06-06 MED ORDER — IOHEXOL 350 MG/ML SOLN
100.0000 mL | Freq: Once | INTRAVENOUS | Status: AC | PRN
  Administered 2015-06-06: 100 mL via INTRAVENOUS

## 2015-06-06 MED ORDER — ACETAMINOPHEN 500 MG PO TABS: 1000.0000 mg | ORAL_TABLET | Freq: Two times a day (BID) | ORAL | Status: DC | PRN

## 2015-06-06 MED ORDER — METOPROLOL TARTRATE 12.5 MG HALF TABLET
12.5000 mg | ORAL_TABLET | Freq: Two times a day (BID) | ORAL | Status: DC
  Administered 2015-06-06: 12.5 mg via ORAL
  Filled 2015-06-06: qty 1

## 2015-06-06 MED ORDER — FUROSEMIDE 40 MG PO TABS: 40.0000 mg | ORAL_TABLET | Freq: Every day | ORAL | Status: DC

## 2015-06-06 NOTE — Care Management Note (Addendum)
Case Management Note  Patient Details  Name: Marie Tanner MRN: [id number] Date of Birth: Jan 03, 1956  Subjective/Objective:  Pt admitted for cp. Pt has insurance [employer].  CM is unable to assist with medications due to insurance is active and on file. Co pay for Brilinta is 25.00. Pt uses [facility] on [address].                     Action/Plan: Consult for medication needs: CM did provide pt with 30 day free card for Brilinta. Pt will need separate Rx for 30 day free Brilinta.  CM did text page MD to make aware of Rx needed. No further needs from CM at this time.    Expected Discharge Date:                  Expected Discharge Plan:  Home/Self Care  In-House Referral:     Discharge planning Services  CM Consult, Medication Assistance  Post Acute Care Choice:  NA Choice offered to:  NA  DME Arranged:  N/A DME Agency:  NA  HH Arranged:  NA HH Agency:  NA  Status of Service:  Completed, signed off  Medicare Important Message Given:    Date Medicare IM Given:    Medicare IM give by:    Date Additional Medicare IM Given:    Additional Medicare Important Message give by:     If discussed at [place] of Stay Meetings, dates discussed:    Additional Comments:  Bethena Roys, RN 06/06/2015, 11:44 AM

## 2015-06-06 NOTE — H&P (Addendum)
[facility] History and Physical  Nikki Glanzer YSA:[id number] DOB: 1955/12/09 DOA: 06/05/2015  Referring physician: ED physician PCP: Lynnell Jude, MD  Specialists:   Chief Complaint: Chest pain and diarrhea  HPI: Marie Tanner is a 59 y.o. female with PMH of hypertension, hyperlipidemia, diabetes mellitus, GERD, gout, systolic congestive heart failure (EF of 40%), CAD, non-STEMI (s/p of stent placement 09/2014 at the [facility]), hx of cardiac arrest, left leg DVT (completed six-month Coumadin treatment and the repeated venous doppler in May was negative),  osa not on CPAP, who presents with chest pain and diarrhea.  Patient reports that she started having chest pain at about 10 AM. It is located in the substernal area, constant, 6 out of 10 in severity, pressure-like, radiating to her back between shoulder blades. It is not aggravated or alleviated by any known factors. It is associated with shortness of breath. Her chest pain has subsided from 6 to 2 out of 10 in severity after received nitroglycerine in ED. She also reports having diarrhea and mild abdominal pain, which started today. She had 2 bowel movements with loose stool. No recent antibodies use. Reports that she has mild dry cough which she attribute lisinopril use, but she has never talked to her doctor about discontinuation of this medication. Does not have symptoms of UTI, fever, chills, unilateral weakness, numbness or tingling sensations.  In ED, patient was found to have WBC 8.7, negative troponin, tachycardia, normal temperature, negative chest x-ray, electrolytes okay, BNP 205.6. EKG has no ischemic change except for Q-wave only in lead III, which existed in previous EKG on 04/25/15. CT angiograms of abdomen/pelvis and chest are negative for PE and aortic dissection. Patient is admitted to inpatient for further evaluation and treatment.  Where does patient live?   At home    Can patient participate in ADLs?  Yes      Review of Systems:   General: no fevers, chills, no changes in body weight, has poor appetite, has fatigue HEENT: no blurry vision, hearing changes or sore throat Pulm: has dyspnea, dry coughing, no wheezing CV: has chest pain, no palpitations Abd: no nausea, vomiting, has abdominal pain and diarrhea, no constipation GU: no dysuria, burning on urination, increased urinary frequency, hematuria  Ext: no leg edema Neuro: no unilateral weakness, numbness, or tingling, no vision change or hearing loss Skin: no rash MSK: No muscle spasm, no deformity, no limitation of range of movement in spin Heme: No easy bruising.  Travel history: No recent long distant travel.  Allergy:  Allergies  Allergen Reactions  . Dilaudid [Hydromorphone Hcl] Anaphylaxis, Itching and Swelling  . Epinephrine Anxiety and Other (See Comments)    Severe anxiety  . Lactose Intolerance (Gi) Diarrhea    Past Medical History  Diagnosis Date  . Renal disorder   . Coronary artery disease   . Liver laceration 10/19/14  . Pulmonary edema 10/19/14  . DVT (deep venous thrombosis) 11/15    left femerol artery injury during resusciation.  [name] STEMI (ST elevation myocardial infarction) 10/19/14  . Hypertension   . High cholesterol   . Heart murmur   . HCAP (healthcare-associated pneumonia) 11/22/2014  . Sleep apnea     "suppose to wear mask but I don't" (11/22/2014)  . Type II diabetes mellitus   . History of blood transfusion 09/2014    related to MI  . GERD (gastroesophageal reflux disease)   . History of gout   . Depression     Past Surgical  History  Procedure Laterality Date  . Cesarean section  1980; 1982  . Laparoscopic abdominal exploration    . Liver laceration repair  09/2014    related to CPR/notes 11/22/2014  . Tracheostomy  09/2014    "closed on it's own when they took it out"  . Femerol artery repair Right 09/2014    pt insists it was a right femoral artery repair on 11/22/2014  .  Extracorporeal circulation    . Left ventricular assist device      pt is not aware of this hx on 11/22/2014  . Abdominal hysterectomy  ~ 2000  . Tubal ligation  1982  . Coronary angioplasty with stent placement  09/2014    "multiple"/notes 11/22/2014  . Application of wound vac  10/2014    "over my naval" (11/22/2014)    Social History:  reports that she has never smoked. She has never used smokeless tobacco. She reports that she does not drink alcohol or use illicit drugs.  Family History:  Family History  Problem Relation Age of Onset  . Diabetes Mother   . Hypertension Mother   . COPD Mother   . Heart attack Father   . Alzheimer's disease Father   . Diabetes Brother      Prior to Admission medications   Medication Sig Start Date End Date Taking? Authorizing Provider  acetaminophen (TYLENOL) 500 MG tablet Take 1,000 mg by mouth 2 (two) times daily as needed (pain).    Yes Historical Provider, MD  aspirin EC 81 MG tablet Take 1 tablet (81 mg total) by mouth daily. 04/08/15  Yes Darlin Coco, MD  furosemide (LASIX) 40 MG tablet Take 40 mg by mouth daily at 12 noon.    Yes Historical Provider, MD  lisinopril (PRINIVIL,ZESTRIL) 10 MG tablet Take 10 mg by mouth daily.   Yes Historical Provider, MD  magnesium oxide (MAG-OX) 400 MG tablet Take 1,200 mg by mouth daily.    Yes Historical Provider, MD  metFORMIN (GLUCOPHAGE) 500 MG tablet Take 500 mg by mouth See admin instructions. Takes med only on Sun, Tues, Thurs and Sat Takes skips meds on Mon, wed, and fri   Yes Historical Provider, MD  metoprolol tartrate (LOPRESSOR) 25 MG tablet Take 12.5 mg by mouth every 12 (twelve) hours.    Yes Historical Provider, MD  nitroGLYCERIN (NITROSTAT) 0.4 MG SL tablet Place 0.4 mg under the tongue every 5 (five) minutes as needed for chest pain. Take every 5 minutes for max of 3 if no relief call [phone]   Yes Historical Provider, MD  potassium chloride SA (K-DUR,KLOR-CON) 20 MEQ tablet Take 20 mEq by  mouth daily.    Yes Historical Provider, MD  ticagrelor (BRILINTA) 90 MG TABS tablet Take 90 mg by mouth every 12 (twelve) hours.    Yes Historical Provider, MD  HYDROcodone-acetaminophen (VICODIN) 2.5-500 MG per tablet Take 1 tablet by mouth every 6 (six) hours as needed for pain. 04/25/15   Charlesetta Shanks, MD  sertraline (ZOLOFT) 50 MG tablet Take 50 mg by mouth daily.    Historical Provider, MD    Physical Exam: Filed Vitals:   06/06/15 0115 06/06/15 0244 06/06/15 0300 06/06/15 0338  BP: 99/53 110/55 102/61 103/53  Pulse: 83 85 80   Temp:    98.3 F (36.8 C)  TempSrc:    Oral  Resp: 16  15 18   Height:    5\' 2"  (1.575 m)  Weight:    80.105 kg (176 lb 9.6 oz)  SpO2: 98%  100% 98%   General: Not in acute distress. dry mucous and membrane HEENT:       Eyes: PERRL, EOMI, no scleral icterus.       ENT: No discharge from the ears and nose, no pharynx injection, no tonsillar enlargement.        Neck: No JVD, no bruit, no mass felt. Heme: No neck lymph node enlargement. Cardiac: S1/S2, RRR, No murmurs, No gallops or rubs. Pulm:  No rales, wheezing, rhonchi or rubs. Abd: Soft, nondistended, nontender, no rebound pain, no organomegaly, BS present. Ext: No pitting leg edema bilaterally. 2+DP/PT pulse bilaterally. Musculoskeletal: No joint deformities, No joint redness or warmth, no limitation of ROM in spin. Skin: No rashes.  Neuro: Alert, oriented X3, cranial nerves II-XII grossly intact, muscle strength 5/5 in all extremities, sensation to light touch intact.  Psych: Patient is not psychotic, no suicidal or hemocidal ideation.  Labs on Admission:  Basic Metabolic Panel:  Recent Labs Lab 06/05/15 2036  NA 134*  K 4.0  CL 99*  CO2 27  GLUCOSE 118*  BUN 14  CREATININE 0.81  CALCIUM 9.3   Liver Function Tests: No results for input(s): AST, ALT, ALKPHOS, BILITOT, PROT, ALBUMIN in the last 168 hours. No results for input(s): LIPASE, AMYLASE in the last 168 hours. No results for  input(s): AMMONIA in the last 168 hours. CBC:  Recent Labs Lab 06/05/15 2036  WBC 8.7  HGB 12.0  HCT 36.4  MCV 83.1  PLT 270   Cardiac Enzymes:  Recent Labs Lab 06/06/15 0100  TROPONINI <0.03    BNP (last 3 results)  Recent Labs  11/24/14 2002 06/05/15 2036  BNP 379.4* 205.6*    ProBNP (last 3 results) No results for input(s): PROBNP in the last 8760 hours.  CBG:  Recent Labs Lab 05/31/15 0953  GLUCAP 117*    Radiological Exams on Admission: Dg Chest 2 View  06/05/2015   CLINICAL DATA:  Left-sided chest pain and right arm numbness beginning today.  EXAM: CHEST  2 VIEW  COMPARISON:  PA and lateral chest 04/25/2015.  CT chest 01/17/2015.  FINDINGS: Heart size and mediastinal contours are within normal limits. Both lungs are clear. Visualized skeletal structures are unremarkable.  IMPRESSION: Negative exam.   Electronically Signed   By: Inge Rise M.D.   On: 06/05/2015 21:12   Ct Angio Chest Aorta W/cm &/or Wo/cm  06/06/2015   CLINICAL DATA:  Acute onset of generalized chest pain and shortness of breath, radiating to the back. Initial encounter.  EXAM: CT ANGIOGRAPHY CHEST, ABDOMEN AND PELVIS  TECHNIQUE: Multidetector CT imaging through the chest, abdomen and pelvis was performed using the standard protocol during bolus administration of intravenous contrast. Multiplanar reconstructed images and MIPs were obtained and reviewed to evaluate the vascular anatomy.  CONTRAST:  141mL OMNIPAQUE IOHEXOL 350 MG/ML SOLN  COMPARISON:  None.  Chest radiograph performed 06/05/2015, and CTA of the chest performed 01/17/2015  FINDINGS: CTA CHEST FINDINGS  There is no evidence of aortic dissection. There is no evidence of aneurysmal dilatation. Mild calcification is noted along the thoracic aorta, without luminal narrowing.  There is no evidence of pulmonary embolus.  Minimal bilateral atelectasis is noted. The lungs are otherwise clear. There is no evidence of significant focal  consolidation, pleural effusion or pneumothorax. No masses are identified; no abnormal focal contrast enhancement is seen.  The mediastinum is unremarkable in appearance. No mediastinal lymphadenopathy is seen. No pericardial effusion is identified. The great vessels  are grossly unremarkable in appearance. No axillary lymphadenopathy is seen. The thyroid gland is unremarkable in appearance.  No acute osseous abnormalities are seen.  Review of the MIP images confirms the above findings.  CTA ABDOMEN AND PELVIS FINDINGS  There is no evidence of aortic dissection. There is no evidence of aneurysmal dilatation. Mild calcification is noted at the origins of the superior mesenteric artery, celiac trunk and bilateral renal arteries. Mild calcification is noted along the distal abdominal aorta and its branches. Mild calcification is seen at the origin of the inferior mesenteric artery. The visualized branches of the abdominal aorta appear patent.  The inferior vena cava is grossly unremarkable in appearance.  The liver and spleen are unremarkable in appearance. The gallbladder is within normal limits. The pancreas and adrenal glands are unremarkable.  The kidneys are unremarkable in appearance. There is no evidence of hydronephrosis. No renal or ureteral stones are seen. No perinephric stranding is appreciated.  No free fluid is identified. The small bowel is unremarkable in appearance. The stomach is within normal limits. No acute vascular abnormalities are seen.  The appendix is normal in caliber, without evidence of appendicitis. The colon is unremarkable in appearance.  The bladder is decompressed and not well assessed. The patient is status post hysterectomy. The ovaries are relatively symmetric. No suspicious adnexal masses are seen. No inguinal lymphadenopathy is seen.  No acute osseous abnormalities are identified.  Review of the MIP images confirms the above findings.  IMPRESSION: 1. No evidence of aortic  dissection. No evidence of aneurysmal dilatation. Mild calcification along the thoracic aorta, without luminal narrowing. Mild calcification at the origins of the branches of the abdominal aorta and at the distal abdominal aorta, without significant luminal narrowing. 2. No evidence of pulmonary embolus. 3. Minimal bilateral atelectasis noted; lungs otherwise clear.   Electronically Signed   By: Garald Balding M.D.   On: 06/06/2015 02:07   Ct Angio Abd/pel W/ And/or W/o  06/06/2015   CLINICAL DATA:  Acute onset of generalized chest pain and shortness of breath, radiating to the back. Initial encounter.  EXAM: CT ANGIOGRAPHY CHEST, ABDOMEN AND PELVIS  TECHNIQUE: Multidetector CT imaging through the chest, abdomen and pelvis was performed using the standard protocol during bolus administration of intravenous contrast. Multiplanar reconstructed images and MIPs were obtained and reviewed to evaluate the vascular anatomy.  CONTRAST:  13mL OMNIPAQUE IOHEXOL 350 MG/ML SOLN  COMPARISON:  None.  Chest radiograph performed 06/05/2015, and CTA of the chest performed 01/17/2015  FINDINGS: CTA CHEST FINDINGS  There is no evidence of aortic dissection. There is no evidence of aneurysmal dilatation. Mild calcification is noted along the thoracic aorta, without luminal narrowing.  There is no evidence of pulmonary embolus.  Minimal bilateral atelectasis is noted. The lungs are otherwise clear. There is no evidence of significant focal consolidation, pleural effusion or pneumothorax. No masses are identified; no abnormal focal contrast enhancement is seen.  The mediastinum is unremarkable in appearance. No mediastinal lymphadenopathy is seen. No pericardial effusion is identified. The great vessels are grossly unremarkable in appearance. No axillary lymphadenopathy is seen. The thyroid gland is unremarkable in appearance.  No acute osseous abnormalities are seen.  Review of the MIP images confirms the above findings.  CTA ABDOMEN  AND PELVIS FINDINGS  There is no evidence of aortic dissection. There is no evidence of aneurysmal dilatation. Mild calcification is noted at the origins of the superior mesenteric artery, celiac trunk and bilateral renal arteries. Mild calcification is noted  along the distal abdominal aorta and its branches. Mild calcification is seen at the origin of the inferior mesenteric artery. The visualized branches of the abdominal aorta appear patent.  The inferior vena cava is grossly unremarkable in appearance.  The liver and spleen are unremarkable in appearance. The gallbladder is within normal limits. The pancreas and adrenal glands are unremarkable.  The kidneys are unremarkable in appearance. There is no evidence of hydronephrosis. No renal or ureteral stones are seen. No perinephric stranding is appreciated.  No free fluid is identified. The small bowel is unremarkable in appearance. The stomach is within normal limits. No acute vascular abnormalities are seen.  The appendix is normal in caliber, without evidence of appendicitis. The colon is unremarkable in appearance.  The bladder is decompressed and not well assessed. The patient is status post hysterectomy. The ovaries are relatively symmetric. No suspicious adnexal masses are seen. No inguinal lymphadenopathy is seen.  No acute osseous abnormalities are identified.  Review of the MIP images confirms the above findings.  IMPRESSION: 1. No evidence of aortic dissection. No evidence of aneurysmal dilatation. Mild calcification along the thoracic aorta, without luminal narrowing. Mild calcification at the origins of the branches of the abdominal aorta and at the distal abdominal aorta, without significant luminal narrowing. 2. No evidence of pulmonary embolus. 3. Minimal bilateral atelectasis noted; lungs otherwise clear.   Electronically Signed   By: Garald Balding M.D.   On: 06/06/2015 02:07    EKG: Independently reviewed.  Abnormal findings:  Q-wave only in  lead III, which existed in previous EKG on 04/25/15.    Assessment/Plan Principal Problem:   Chest pain Active Problems:   Dyspnea   DVT (deep venous thrombosis)   DM type 2 (diabetes mellitus, type 2)   Mitral regurgitation   Ischemic heart disease due to coronary artery obstruction   Left ventricular systolic dysfunction   Diarrhea   Depression   Essential hypertension  Chest pain and hx of CAD/NSTEMI: No PE or aortic dissection by CT angiogram. No pneumonia on chest x-ray. Need to rule out ACS given her significant risk factors, including hypertension, hyperlipidemia, diabetes mellitus, and history of non-STEMI.  - will admit to Tele bed  - cycle CE q6 x3 and repeat her EKG in the am  - Nitroglycerin, aspirin, lipitor, brlinta, metoprolol - Tramadol and tylenol for pain (patient has anaphylactic reaction to Dilaudid. Will avoid morphine. Patient states that she can take tramadol). - Risk factor stratification: will check FLP and A1C  - 2d echo -Card was consulted in AM, will see today.    Diarrhea: Unclear etiology. No recent antibodies use. Likely due to viral enteritis, but still need to rule out other possibilities, such as C. difficile. -check c diff pcr -hold lasix due to soft bp and dehydration.  -give gentle IVF, NS 50 cc/h while on NPO -zofran for nausea  DM-II: Last A1c 6.3 on 11/22/14, well controled. Patient is taking metformin at home -SSI -Check A1c  HLD: No ldl on recored -Continue home medications: Lipitor -Check FLP  Systolic congestive heart failure: Today Alcon 11/23/14 showed EF of 40%. Patient is on Lasix at home. CHF is compensated. Bnp=205.6 No leg edema. -Hold her Lasix since patient is clinically dry 2/2 diarrhea -Watch volume status closely  HTN: Patient is on lisinopril at home. She has chronic dry cough, which she attributes to lisinopril. Patient's blood pressure is soft on admission, likely secondary to diarrhea. -Hold lisinopril, which may  need to be  switched to another medications at discharge.   DVT ppx: SQ Heparin  Code Status: Full code Family Communication: None at bed side.    Disposition Plan: Admit to inpatient   Date of Service 06/06/2015    Ivor Costa [facility] Pager [phone]  If 7PM-7AM, please contact night-coverage [email or web address] Password [email or web address] 06/06/2015, 4:11 AM

## 2015-06-06 NOTE — Progress Notes (Signed)
  Echocardiogram 2D Echocardiogram has been performed.  Marie Tanner 06/06/2015, 11:24 AM

## 2015-06-06 NOTE — Evaluation (Signed)
Physical Therapy Evaluation Patient Details Name: Marie Tanner MRN: [id number] DOB: 1956/08/15 Today's Date: 06/06/2015   History of Present Illness  59 y.o. admitted with diarrhea and chest pain on 06/05/15.Marie Tanner Patient reports that she started having chest pain at about 10 AM yesterday It was located in the substernal area, constant, 6 out of 10 in severity, pressure-like, radiating to her back between shoulder blades. It Was not aggravated or alleviated by any factors. It was associated with shortness of breath. Her chest pain has subsided from 6 to 2 out of 10 in severity after received nitroglycerine in ED. Followed by loose stools and diarrhea. No pain this am r/o ECG no acute changes 09/2014 had MI with complicated course at [facility]. Involved cardiac arrest, DVT, femoral artery repair liver laceration from CPR and tracheostomy with ECMO> She has a reported EF of 40%.  Clinical Impression  Patient reports feeling poorly today. States has become nauseated and unable to eat. Reports CBG in 80's. Patient's orthostatic BP's taken with no drop. See FLOWSHEETS FOR  VS. Patient is moving well, but slowly. Patient will benefit from PT to address problems listed in note below.    Follow Up Recommendations No PT follow up    Equipment Recommendations  None recommended by PT    Recommendations for Other Services       Precautions / Restrictions Precautions Precautions: None      Mobility  Bed Mobility Overal bed mobility: Independent                Transfers Overall transfer level: Independent                  Ambulation/Gait Ambulation/Gait assistance: Supervision Ambulation Distance (Feet): 30 Feet Assistive device: None Gait Pattern/deviations: WFL(Within Functional Limits) Gait velocity: decreased      Stairs            Wheelchair Mobility    Modified Rankin (Stroke Patients Only)       Balance Overall balance assessment: Independent                                           Pertinent Vitals/Pain Pain Assessment: 0-10 Pain Score: 0-No pain    Home Living Family/patient expects to be discharged to:: Private residence Living Arrangements: Children Available Help at Discharge: Family;Available 24 hours/day Type of Home: House Home Access: Stairs to enter     Home Layout: Two level;Able to live on main level with bedroom/bathroom Home Equipment: None      Prior Function                 Hand Dominance        Extremity/Trunk Assessment   Upper Extremity Assessment: Overall WFL for tasks assessed           Lower Extremity Assessment: Overall WFL for tasks assessed      Cervical / Trunk Assessment: Normal  Communication   Communication: No difficulties  Cognition   Behavior During Therapy: Flat affect Overall Cognitive Status: Within Functional Limits for tasks assessed                      General Comments      Exercises        Assessment/Plan    PT Assessment Patient needs continued PT services  PT Diagnosis Generalized weakness   PT Problem  List Decreased activity tolerance;Decreased mobility  PT Treatment Interventions Gait training   PT Goals (Current goals can be found in the Care Plan section) Acute Rehab PT Goals Patient Stated Goal: wants to feel better and go home PT Goal Formulation: With patient Time For Goal Achievement: 06/20/15 Potential to Achieve Goals: Good    Frequency Min 3X/week   Barriers to discharge        Co-evaluation               End of Session   Activity Tolerance: Patient limited by fatigue Patient left: in bed;with call bell/phone within reach Nurse Communication: Mobility status         Time: 1012-1038 PT Time Calculation (min) (ACUTE ONLY): 26 min   Charges:   PT Evaluation $Initial PT Evaluation Tier I: 1 Procedure PT Treatments $Gait Training: 8-22 mins   PT G Codes:        Claretha Cooper 06/06/2015, 10:42 AM Tresa Endo PT [phone]

## 2015-06-06 NOTE — Consult Note (Signed)
CARDIOLOGY CONSULT NOTE       Patient ID: Marie Tanner MRN: [id number] DOB/AGE: September 17, 1956 59 y.o.  Admit date: 06/05/2015 Referring Physician:  Olevia Bowens Primary Physician: Lynnell Jude, MD Primary Cardiologist:  Mare Ferrari Reason for Consultation:  Chest pain  Principal Problem:   Chest pain Active Problems:   Dyspnea   DVT (deep venous thrombosis)   DM type 2 (diabetes mellitus, type 2)   Mitral regurgitation   Ischemic heart disease due to coronary artery obstruction   Left ventricular systolic dysfunction   Diarrhea   Depression   Essential hypertension   HPI:  59 y.o. admitted with diarrhea and chest pain.  Patient reports that she started having chest pain at about 10 AM yesterday  It was located in the substernal area, constant, 6 out of 10 in severity, pressure-like, radiating to her back between shoulder blades. It  Was  not aggravated or alleviated by any factors. It was  associated with shortness of breath. Her chest pain has subsided from 6 to 2 out of 10 in severity after received nitroglycerine in ED. Followed by loose stools and diarrhea.  No pain this am r/o ECG no acute changes  09/2014 had MI with complicated course at [facility].  Involved cardiac arrest, DVT, femoral artery repair liver laceration from CPR  and tracheostomy with ECMO>  She has a reported EF of 40%.  Care Everywhere notes indicate LM/Circumflex stents.  Still on Brillinta bid.  Compliant with meds.  This am with less abdominal pain.  No BM.  No chest pain     ROS All other systems reviewed and negative except as noted above  Past Medical History  Diagnosis Date  . Renal disorder   . Coronary artery disease   . Liver laceration 10/19/14  . Pulmonary edema 10/19/14  . DVT (deep venous thrombosis) 11/15    left femerol artery injury during resusciation.  [name] STEMI (ST elevation myocardial infarction) 10/19/14  . Hypertension   . High cholesterol   . Heart murmur   . HCAP  (healthcare-associated pneumonia) 11/22/2014  . Sleep apnea     "suppose to wear mask but I don't" (11/22/2014)  . Type II diabetes mellitus   . History of blood transfusion 09/2014    related to MI  . GERD (gastroesophageal reflux disease)   . History of gout   . Depression     Family History  Problem Relation Age of Onset  . Diabetes Mother   . Hypertension Mother   . COPD Mother   . Heart attack Father   . Alzheimer's disease Father   . Diabetes Brother     History   Social History  . Marital Status: Married    Spouse Name: N/A  . Number of Children: N/A  . Years of Education: N/A   Occupational History  . Not on file.   Social History Main Topics  . Smoking status: Never Smoker   . Smokeless tobacco: Never Used  . Alcohol Use: No  . Drug Use: No  . Sexual Activity: Not Currently   Other Topics Concern  . Not on file   Social History Narrative    Past Surgical History  Procedure Laterality Date  . Cesarean section  1980; 1982  . Laparoscopic abdominal exploration    . Liver laceration repair  09/2014    related to CPR/notes 11/22/2014  . Tracheostomy  09/2014    "closed on it's own when they took it out"  . Femerol  artery repair Right 09/2014    pt insists it was a right femoral artery repair on 11/22/2014  . Extracorporeal circulation    . Left ventricular assist device      pt is not aware of this hx on 11/22/2014  . Abdominal hysterectomy  ~ 2000  . Tubal ligation  1982  . Coronary angioplasty with stent placement  09/2014    "multiple"/notes 11/22/2014  . Application of wound vac  10/2014    "over my naval" (11/22/2014)     . aspirin  324 mg Oral Daily  . atorvastatin  40 mg Oral q1800  . heparin subcutaneous  5,000 Units Subcutaneous 3 times per day  . insulin aspart  0-9 Units Subcutaneous TID WC  . magnesium oxide  1,200 mg Oral Daily  . metoprolol tartrate  12.5 mg Oral Q12H  . sertraline  50 mg Oral Daily  . sodium chloride  3 mL  Intravenous Q12H  . ticagrelor  90 mg Oral Q12H  . traMADol  50 mg Oral 4 times per day   . sodium chloride 50 mL/hr at 06/06/15 0251    Physical Exam: Blood pressure 90/55, pulse 80, temperature 97.9 F (36.6 C), temperature source Oral, resp. rate 15, height 5\' 2"  (1.575 m), weight 80.105 kg (176 lb 9.6 oz), SpO2 98 %.   Affect appropriate Healthy:  appears stated age 59: normal Neck supple with no adenopathy JVP normal no bruits no thyromegaly Lungs clear with no wheezing and good diaphragmatic motion Heart:  S1/S2 no murmur, no rub, gallop or click PMI normal Abdomen: benighn, BS positve, no tenderness, surgical scar laparotomy no bruit.  No HSM or HJR Distal pulses intact with no bruits RFA repair scar  No edema Neuro non-focal Skin warm and dry No muscular weakness   Labs:   Lab Results  Component Value Date   WBC 5.0 06/06/2015   HGB 10.9* 06/06/2015   HCT 33.6* 06/06/2015   MCV 82.8 06/06/2015   PLT 242 06/06/2015    Recent Labs Lab 06/06/15 0457  NA 131*  K 3.9  CL 100*  CO2 23  BUN 11  CREATININE 0.64  CALCIUM 8.7*  PROT 8.0  BILITOT 0.9  ALKPHOS 107  ALT 73*  AST 103*  GLUCOSE 100*   Lab Results  Component Value Date   TROPONINI <0.03 06/06/2015    Lab Results  Component Value Date   CHOL 172 06/06/2015   Lab Results  Component Value Date   HDL 49 06/06/2015   Lab Results  Component Value Date   LDLCALC 114* 06/06/2015   Lab Results  Component Value Date   TRIG 44 06/06/2015   Lab Results  Component Value Date   CHOLHDL 3.5 06/06/2015   No results found for: LDLDIRECT    Radiology: Dg Chest 2 View  06/05/2015   CLINICAL DATA:  Left-sided chest pain and right arm numbness beginning today.  EXAM: CHEST  2 VIEW  COMPARISON:  PA and lateral chest 04/25/2015.  CT chest 01/17/2015.  FINDINGS: Heart size and mediastinal contours are within normal limits. Both lungs are clear. Visualized skeletal structures are unremarkable.   IMPRESSION: Negative exam.   Electronically Signed   By: Inge Rise M.D.   On: 06/05/2015 21:12   Ct Angio Chest Aorta W/cm &/or Wo/cm  06/06/2015   CLINICAL DATA:  Acute onset of generalized chest pain and shortness of breath, radiating to the back. Initial encounter.  EXAM: CT ANGIOGRAPHY CHEST, ABDOMEN AND PELVIS  TECHNIQUE: Multidetector CT imaging through the chest, abdomen and pelvis was performed using the standard protocol during bolus administration of intravenous contrast. Multiplanar reconstructed images and MIPs were obtained and reviewed to evaluate the vascular anatomy.  CONTRAST:  1105mL OMNIPAQUE IOHEXOL 350 MG/ML SOLN  COMPARISON:  None.  Chest radiograph performed 06/05/2015, and CTA of the chest performed 01/17/2015  FINDINGS: CTA CHEST FINDINGS  There is no evidence of aortic dissection. There is no evidence of aneurysmal dilatation. Mild calcification is noted along the thoracic aorta, without luminal narrowing.  There is no evidence of pulmonary embolus.  Minimal bilateral atelectasis is noted. The lungs are otherwise clear. There is no evidence of significant focal consolidation, pleural effusion or pneumothorax. No masses are identified; no abnormal focal contrast enhancement is seen.  The mediastinum is unremarkable in appearance. No mediastinal lymphadenopathy is seen. No pericardial effusion is identified. The great vessels are grossly unremarkable in appearance. No axillary lymphadenopathy is seen. The thyroid gland is unremarkable in appearance.  No acute osseous abnormalities are seen.  Review of the MIP images confirms the above findings.  CTA ABDOMEN AND PELVIS FINDINGS  There is no evidence of aortic dissection. There is no evidence of aneurysmal dilatation. Mild calcification is noted at the origins of the superior mesenteric artery, celiac trunk and bilateral renal arteries. Mild calcification is noted along the distal abdominal aorta and its branches. Mild calcification is  seen at the origin of the inferior mesenteric artery. The visualized branches of the abdominal aorta appear patent.  The inferior vena cava is grossly unremarkable in appearance.  The liver and spleen are unremarkable in appearance. The gallbladder is within normal limits. The pancreas and adrenal glands are unremarkable.  The kidneys are unremarkable in appearance. There is no evidence of hydronephrosis. No renal or ureteral stones are seen. No perinephric stranding is appreciated.  No free fluid is identified. The small bowel is unremarkable in appearance. The stomach is within normal limits. No acute vascular abnormalities are seen.  The appendix is normal in caliber, without evidence of appendicitis. The colon is unremarkable in appearance.  The bladder is decompressed and not well assessed. The patient is status post hysterectomy. The ovaries are relatively symmetric. No suspicious adnexal masses are seen. No inguinal lymphadenopathy is seen.  No acute osseous abnormalities are identified.  Review of the MIP images confirms the above findings.  IMPRESSION: 1. No evidence of aortic dissection. No evidence of aneurysmal dilatation. Mild calcification along the thoracic aorta, without luminal narrowing. Mild calcification at the origins of the branches of the abdominal aorta and at the distal abdominal aorta, without significant luminal narrowing. 2. No evidence of pulmonary embolus. 3. Minimal bilateral atelectasis noted; lungs otherwise clear.   Electronically Signed   By: Garald Balding M.D.   On: 06/06/2015 02:07   Ct Angio Abd/pel W/ And/or W/o  06/06/2015   CLINICAL DATA:  Acute onset of generalized chest pain and shortness of breath, radiating to the back. Initial encounter.  EXAM: CT ANGIOGRAPHY CHEST, ABDOMEN AND PELVIS  TECHNIQUE: Multidetector CT imaging through the chest, abdomen and pelvis was performed using the standard protocol during bolus administration of intravenous contrast. Multiplanar  reconstructed images and MIPs were obtained and reviewed to evaluate the vascular anatomy.  CONTRAST:  123mL OMNIPAQUE IOHEXOL 350 MG/ML SOLN  COMPARISON:  None.  Chest radiograph performed 06/05/2015, and CTA of the chest performed 01/17/2015  FINDINGS: CTA CHEST FINDINGS  There is no evidence of aortic dissection. There is no  evidence of aneurysmal dilatation. Mild calcification is noted along the thoracic aorta, without luminal narrowing.  There is no evidence of pulmonary embolus.  Minimal bilateral atelectasis is noted. The lungs are otherwise clear. There is no evidence of significant focal consolidation, pleural effusion or pneumothorax. No masses are identified; no abnormal focal contrast enhancement is seen.  The mediastinum is unremarkable in appearance. No mediastinal lymphadenopathy is seen. No pericardial effusion is identified. The great vessels are grossly unremarkable in appearance. No axillary lymphadenopathy is seen. The thyroid gland is unremarkable in appearance.  No acute osseous abnormalities are seen.  Review of the MIP images confirms the above findings.  CTA ABDOMEN AND PELVIS FINDINGS  There is no evidence of aortic dissection. There is no evidence of aneurysmal dilatation. Mild calcification is noted at the origins of the superior mesenteric artery, celiac trunk and bilateral renal arteries. Mild calcification is noted along the distal abdominal aorta and its branches. Mild calcification is seen at the origin of the inferior mesenteric artery. The visualized branches of the abdominal aorta appear patent.  The inferior vena cava is grossly unremarkable in appearance.  The liver and spleen are unremarkable in appearance. The gallbladder is within normal limits. The pancreas and adrenal glands are unremarkable.  The kidneys are unremarkable in appearance. There is no evidence of hydronephrosis. No renal or ureteral stones are seen. No perinephric stranding is appreciated.  No free fluid is  identified. The small bowel is unremarkable in appearance. The stomach is within normal limits. No acute vascular abnormalities are seen.  The appendix is normal in caliber, without evidence of appendicitis. The colon is unremarkable in appearance.  The bladder is decompressed and not well assessed. The patient is status post hysterectomy. The ovaries are relatively symmetric. No suspicious adnexal masses are seen. No inguinal lymphadenopathy is seen.  No acute osseous abnormalities are identified.  Review of the MIP images confirms the above findings.  IMPRESSION: 1. No evidence of aortic dissection. No evidence of aneurysmal dilatation. Mild calcification along the thoracic aorta, without luminal narrowing. Mild calcification at the origins of the branches of the abdominal aorta and at the distal abdominal aorta, without significant luminal narrowing. 2. No evidence of pulmonary embolus. 3. Minimal bilateral atelectasis noted; lungs otherwise clear.   Electronically Signed   By: Garald Balding M.D.   On: 06/06/2015 02:07    EKG:  SR low voltage no acute ECG changes   ASSESSMENT AND PLAN:  Chest Pain:  Atypical  Horrendous complicated course 01/5008 at [facility] after cath.  With prolonged pain and no ECG changes, negative troponins would observe and arrange outpatient f/u with Dr Mare Ferrari and avoid any invasive evaluation.  CT with no PE and aorta ok  Suspect outpatient myovue best.  Continue DAT.    Diarrhea:  Per primary service    Chol:  Continue station   Signed: Jenkins Rouge 06/06/2015, 8:25 AM

## 2015-06-06 NOTE — ED Notes (Signed)
Admitting at bedside 

## 2015-06-06 NOTE — Discharge Summary (Signed)
Physician Discharge Summary  Demmi Sindt ESP:[id number] DOB: 05/20/1956 DOA: 06/05/2015  PCP: Lynnell Jude, MD  Admit date: 06/05/2015 Discharge date: 06/06/2015  Time spent: 35 minutes  Recommendations for Outpatient Follow-up:  1. [facility]  Discharge Diagnoses:  Principal Problem:   Chest pain Active Problems:   Dyspnea   DVT (deep venous thrombosis)   DM type 2 (diabetes mellitus, type 2)   Mitral regurgitation   Ischemic heart disease due to coronary artery obstruction   Left ventricular systolic dysfunction   Diarrhea   Depression   Essential hypertension   Discharge Condition: stable  Diet recommendation: heart healthy  Filed Weights   06/05/15 2020 06/06/15 0338  Weight: 80.513 kg (177 lb 8 oz) 80.105 kg (176 lb 9.6 oz)    History of present illness:  59 y.o. female with PMH of hypertension, hyperlipidemia, diabetes mellitus, GERD, gout, systolic congestive heart failure (EF of 40%), CAD, non-STEMI (s/p of stent placement 09/2014 at the [facility]), hx of cardiac arrest, left leg DVT (completed six-month Coumadin treatment and the repeated venous doppler in May was negative), osa not on CPAP, who presents with chest pain and diarrhea.  Patient reports that she started having chest pain at about 10 AM. It is located in the substernal area, constant, 6 out of 10 in severity, pressure-like, radiating to her back between shoulder blades. It is not aggravated or alleviated by any known factors. It is associated with shortness of breath. Her chest pain has subsided from 6 to 2 out of 10 in severity after received nitroglycerine in ED. She also reports having diarrhea and mild abdominal pain, which started today. She had 2 bowel movements with loose stool. No recent antibodies use. Reports that she has mild dry cough which she attribute lisinopril use, but she has never talked to her doctor about discontinuation of this medication. Does  not have symptoms of UTI, fever, chills, unilateral weakness, numbness or tingling sensations.  Hospital Course:  Chest pain and hx of CAD/NSTEMI:  - No PE or aortic dissection by CT angiogram. No pneumonia on chest x-ray.  - No events on telemetry. - Troponins x3 negative - Continued on, aspirin, lipitor, brlinta, metoprolol - Consulted cardiology rec outpatient follow up.   Diarrhea: - no further diarrhea - likely viral gastroenteritis.  DM-II:  - no changes made to her medications.  HLD:   cont lipitor.  Systolic congestive heart failure:  - no changes made to her medication. - held ACE for 2 days resume as as an outpatient   HTN: - no changes made.   Procedures:  CXR  Consultations:  cardiology  Discharge Exam: Filed Vitals:   06/06/15 0800  BP: 90/55  Pulse:   Temp:   Resp:     General: A&O x3 Cardiovascular: RRR Respiratory: good air movement CTA B/L  Discharge Instructions   Discharge Instructions    Diet - low sodium heart healthy    Complete by:  As directed      Diet - low sodium heart healthy    Complete by:  As directed      Increase activity slowly    Complete by:  As directed      Increase activity slowly    Complete by:  As directed           Current Discharge Medication List    START taking these medications   Details  atorvastatin (LIPITOR) 40 MG tablet Take 1 tablet (  40 mg total) by mouth daily at 6 PM. Qty: 30 tablet, Refills: 0      CONTINUE these medications which have NOT CHANGED   Details  acetaminophen (TYLENOL) 500 MG tablet Take 1,000 mg by mouth 2 (two) times daily as needed (pain).     aspirin EC 81 MG tablet Take 1 tablet (81 mg total) by mouth daily.    furosemide (LASIX) 40 MG tablet Take 40 mg by mouth daily at 12 noon.     lisinopril (PRINIVIL,ZESTRIL) 10 MG tablet Take 10 mg by mouth daily.    magnesium oxide (MAG-OX) 400 MG tablet Take 1,200 mg by mouth daily.     metFORMIN (GLUCOPHAGE) 500 MG  tablet Take 500 mg by mouth See admin instructions. Takes med only on Sun, Tues, Thurs and Sat Takes skips meds on Mon, wed, and fri    metoprolol tartrate (LOPRESSOR) 25 MG tablet Take 12.5 mg by mouth every 12 (twelve) hours.     nitroGLYCERIN (NITROSTAT) 0.4 MG SL tablet Place 0.4 mg under the tongue every 5 (five) minutes as needed for chest pain. Take every 5 minutes for max of 3 if no relief call [phone]    potassium chloride SA (K-DUR,KLOR-CON) 20 MEQ tablet Take 20 mEq by mouth daily.     ticagrelor (BRILINTA) 90 MG TABS tablet Take 90 mg by mouth every 12 (twelve) hours.     HYDROcodone-acetaminophen (VICODIN) 2.5-500 MG per tablet Take 1 tablet by mouth every 6 (six) hours as needed for pain. Qty: 30 tablet, Refills: 0    sertraline (ZOLOFT) 50 MG tablet Take 50 mg by mouth daily.       Allergies  Allergen Reactions  . Dilaudid [Hydromorphone Hcl] Anaphylaxis, Itching and Swelling  . Epinephrine Anxiety and Other (See Comments)    Severe anxiety  . Lactose Intolerance (Gi) Diarrhea      The results of significant diagnostics from this hospitalization (including imaging, microbiology, ancillary and laboratory) are listed below for reference.    Significant Diagnostic Studies: Dg Chest 2 View  06/05/2015   CLINICAL DATA:  Left-sided chest pain and right arm numbness beginning today.  EXAM: CHEST  2 VIEW  COMPARISON:  PA and lateral chest 04/25/2015.  CT chest 01/17/2015.  FINDINGS: Heart size and mediastinal contours are within normal limits. Both lungs are clear. Visualized skeletal structures are unremarkable.  IMPRESSION: Negative exam.   Electronically Signed   By: Inge Rise M.D.   On: 06/05/2015 21:12   Ct Angio Chest Aorta W/cm &/or Wo/cm  06/06/2015   CLINICAL DATA:  Acute onset of generalized chest pain and shortness of breath, radiating to the back. Initial encounter.  EXAM: CT ANGIOGRAPHY CHEST, ABDOMEN AND PELVIS  TECHNIQUE: Multidetector CT imaging through the  chest, abdomen and pelvis was performed using the standard protocol during bolus administration of intravenous contrast. Multiplanar reconstructed images and MIPs were obtained and reviewed to evaluate the vascular anatomy.  CONTRAST:  163mL OMNIPAQUE IOHEXOL 350 MG/ML SOLN  COMPARISON:  None.  Chest radiograph performed 06/05/2015, and CTA of the chest performed 01/17/2015  FINDINGS: CTA CHEST FINDINGS  There is no evidence of aortic dissection. There is no evidence of aneurysmal dilatation. Mild calcification is noted along the thoracic aorta, without luminal narrowing.  There is no evidence of pulmonary embolus.  Minimal bilateral atelectasis is noted. The lungs are otherwise clear. There is no evidence of significant focal consolidation, pleural effusion or pneumothorax. No masses are identified; no abnormal focal contrast enhancement  is seen.  The mediastinum is unremarkable in appearance. No mediastinal lymphadenopathy is seen. No pericardial effusion is identified. The great vessels are grossly unremarkable in appearance. No axillary lymphadenopathy is seen. The thyroid gland is unremarkable in appearance.  No acute osseous abnormalities are seen.  Review of the MIP images confirms the above findings.  CTA ABDOMEN AND PELVIS FINDINGS  There is no evidence of aortic dissection. There is no evidence of aneurysmal dilatation. Mild calcification is noted at the origins of the superior mesenteric artery, celiac trunk and bilateral renal arteries. Mild calcification is noted along the distal abdominal aorta and its branches. Mild calcification is seen at the origin of the inferior mesenteric artery. The visualized branches of the abdominal aorta appear patent.  The inferior vena cava is grossly unremarkable in appearance.  The liver and spleen are unremarkable in appearance. The gallbladder is within normal limits. The pancreas and adrenal glands are unremarkable.  The kidneys are unremarkable in appearance. There  is no evidence of hydronephrosis. No renal or ureteral stones are seen. No perinephric stranding is appreciated.  No free fluid is identified. The small bowel is unremarkable in appearance. The stomach is within normal limits. No acute vascular abnormalities are seen.  The appendix is normal in caliber, without evidence of appendicitis. The colon is unremarkable in appearance.  The bladder is decompressed and not well assessed. The patient is status post hysterectomy. The ovaries are relatively symmetric. No suspicious adnexal masses are seen. No inguinal lymphadenopathy is seen.  No acute osseous abnormalities are identified.  Review of the MIP images confirms the above findings.  IMPRESSION: 1. No evidence of aortic dissection. No evidence of aneurysmal dilatation. Mild calcification along the thoracic aorta, without luminal narrowing. Mild calcification at the origins of the branches of the abdominal aorta and at the distal abdominal aorta, without significant luminal narrowing. 2. No evidence of pulmonary embolus. 3. Minimal bilateral atelectasis noted; lungs otherwise clear.   Electronically Signed   By: Garald Balding M.D.   On: 06/06/2015 02:07   Ct Angio Abd/pel W/ And/or W/o  06/06/2015   CLINICAL DATA:  Acute onset of generalized chest pain and shortness of breath, radiating to the back. Initial encounter.  EXAM: CT ANGIOGRAPHY CHEST, ABDOMEN AND PELVIS  TECHNIQUE: Multidetector CT imaging through the chest, abdomen and pelvis was performed using the standard protocol during bolus administration of intravenous contrast. Multiplanar reconstructed images and MIPs were obtained and reviewed to evaluate the vascular anatomy.  CONTRAST:  164mL OMNIPAQUE IOHEXOL 350 MG/ML SOLN  COMPARISON:  None.  Chest radiograph performed 06/05/2015, and CTA of the chest performed 01/17/2015  FINDINGS: CTA CHEST FINDINGS  There is no evidence of aortic dissection. There is no evidence of aneurysmal dilatation. Mild  calcification is noted along the thoracic aorta, without luminal narrowing.  There is no evidence of pulmonary embolus.  Minimal bilateral atelectasis is noted. The lungs are otherwise clear. There is no evidence of significant focal consolidation, pleural effusion or pneumothorax. No masses are identified; no abnormal focal contrast enhancement is seen.  The mediastinum is unremarkable in appearance. No mediastinal lymphadenopathy is seen. No pericardial effusion is identified. The great vessels are grossly unremarkable in appearance. No axillary lymphadenopathy is seen. The thyroid gland is unremarkable in appearance.  No acute osseous abnormalities are seen.  Review of the MIP images confirms the above findings.  CTA ABDOMEN AND PELVIS FINDINGS  There is no evidence of aortic dissection. There is no evidence of aneurysmal dilatation.  Mild calcification is noted at the origins of the superior mesenteric artery, celiac trunk and bilateral renal arteries. Mild calcification is noted along the distal abdominal aorta and its branches. Mild calcification is seen at the origin of the inferior mesenteric artery. The visualized branches of the abdominal aorta appear patent.  The inferior vena cava is grossly unremarkable in appearance.  The liver and spleen are unremarkable in appearance. The gallbladder is within normal limits. The pancreas and adrenal glands are unremarkable.  The kidneys are unremarkable in appearance. There is no evidence of hydronephrosis. No renal or ureteral stones are seen. No perinephric stranding is appreciated.  No free fluid is identified. The small bowel is unremarkable in appearance. The stomach is within normal limits. No acute vascular abnormalities are seen.  The appendix is normal in caliber, without evidence of appendicitis. The colon is unremarkable in appearance.  The bladder is decompressed and not well assessed. The patient is status post hysterectomy. The ovaries are relatively  symmetric. No suspicious adnexal masses are seen. No inguinal lymphadenopathy is seen.  No acute osseous abnormalities are identified.  Review of the MIP images confirms the above findings.  IMPRESSION: 1. No evidence of aortic dissection. No evidence of aneurysmal dilatation. Mild calcification along the thoracic aorta, without luminal narrowing. Mild calcification at the origins of the branches of the abdominal aorta and at the distal abdominal aorta, without significant luminal narrowing. 2. No evidence of pulmonary embolus. 3. Minimal bilateral atelectasis noted; lungs otherwise clear.   Electronically Signed   By: Garald Balding M.D.   On: 06/06/2015 02:07    Microbiology: Recent Results (from the past 240 hour(s))  MRSA PCR Screening     Status: None   Collection Time: 06/06/15  4:12 AM  Result Value Ref Range Status   MRSA by PCR NEGATIVE NEGATIVE Final    Comment:        The GeneXpert MRSA Assay (FDA approved for NASAL specimens only), is one component of a comprehensive MRSA colonization surveillance program. It is not intended to diagnose MRSA infection nor to guide or monitor treatment for MRSA infections.      Labs: Basic Metabolic Panel:  Recent Labs Lab 06/05/15 2036 06/06/15 0457  NA 134* 131*  K 4.0 3.9  CL 99* 100*  CO2 27 23  GLUCOSE 118* 100*  BUN 14 11  CREATININE 0.81 0.64  CALCIUM 9.3 8.7*   Liver Function Tests:  Recent Labs Lab 06/06/15 0457  AST 103*  ALT 73*  ALKPHOS 107  BILITOT 0.9  PROT 8.0  ALBUMIN 3.1*   No results for input(s): LIPASE, AMYLASE in the last 168 hours. No results for input(s): AMMONIA in the last 168 hours. CBC:  Recent Labs Lab 06/05/15 2036 06/06/15 0457  WBC 8.7 5.0  HGB 12.0 10.9*  HCT 36.4 33.6*  MCV 83.1 82.8  PLT 270 242   Cardiac Enzymes:  Recent Labs Lab 06/06/15 0100 06/06/15 0630  TROPONINI <0.03 <0.03   BNP: BNP (last 3 results)  Recent Labs  11/24/14 2002 06/05/15 2036  BNP 379.4*  205.6*    ProBNP (last 3 results) No results for input(s): PROBNP in the last 8760 hours.  CBG:  Recent Labs Lab 05/31/15 0953 06/06/15 0427 06/06/15 0728  GLUCAP 117* 103* 89       Signed:  Charlynne Cousins  [facility] 06/06/2015, 8:58 AM

## 2015-06-07 ENCOUNTER — Encounter (HOSPITAL_COMMUNITY): Payer: BC Managed Care – PPO

## 2015-06-08 LAB — HEMOGLOBIN A1C
Hgb A1c MFr Bld: 6.3 % — ABNORMAL HIGH (ref 4.8–5.6)
Mean Plasma Glucose: 134 mg/dL

## 2015-06-08 NOTE — Progress Notes (Signed)
   06/06/15 1041  PT Time Calculation  PT Start Time (ACUTE ONLY) 1012  PT Stop Time (ACUTE ONLY) 1038  PT Time Calculation (min) (ACUTE ONLY) 26 min  PT G-Codes **NOT FOR INPATIENT CLASS**  Functional Assessment Tool Used (clinical reasoning)  Functional Limitation Mobility: Walking and moving around  Mobility: Walking and Moving Around Current Status (K3838) CI  Mobility: Walking and Moving Around Goal Status (F8403) CH  PT General Charges  $$ ACUTE PT VISIT 1 Procedure  PT Evaluation  $Initial PT Evaluation Tier I 1 Procedure  PT Treatments  $Gait Training 8-22 mins  [place] PT [phone]

## 2015-06-10 ENCOUNTER — Encounter (HOSPITAL_COMMUNITY): Payer: BC Managed Care – PPO

## 2015-06-12 ENCOUNTER — Encounter (HOSPITAL_COMMUNITY): Payer: BC Managed Care – PPO

## 2015-06-13 ENCOUNTER — Ambulatory Visit (INDEPENDENT_AMBULATORY_CARE_PROVIDER_SITE_OTHER): Payer: BC Managed Care – PPO | Admitting: Cardiology

## 2015-06-13 ENCOUNTER — Encounter: Payer: Self-pay | Admitting: Cardiology

## 2015-06-13 VITALS — BP 110/60 | HR 99 | Ht 62.0 in | Wt 177.0 lb

## 2015-06-13 DIAGNOSIS — I240 Acute coronary thrombosis not resulting in myocardial infarction: Secondary | ICD-10-CM | POA: Diagnosis not present

## 2015-06-13 DIAGNOSIS — I519 Heart disease, unspecified: Secondary | ICD-10-CM

## 2015-06-13 DIAGNOSIS — I340 Nonrheumatic mitral (valve) insufficiency: Secondary | ICD-10-CM | POA: Diagnosis not present

## 2015-06-13 DIAGNOSIS — I259 Chronic ischemic heart disease, unspecified: Secondary | ICD-10-CM

## 2015-06-13 NOTE — Progress Notes (Signed)
Cardiology Office Note   Date:  06/13/2015   ID:  Marie Tanner, DOB 02-01-1956, MRN [id number]  PCP:  Marie Jude, MD  Cardiologist: Darlin Coco MD  Chief Complaint  Patient presents with  . Chest Pain      History of Present Illness: Marie Tanner is a 59 y.o. female who presents for post hospital follow-up office visit   She was initially seen at the request of Dr. Rich Number in [place]. She was seen to establish cardiology care here in [place] and also to provide entrance to the [facility] cardiac rehabilitation program. She has a complex past cardiac history. She was previously living in the [place] area. On 10/19/2014 she developed chest pain and was taken by ambulance to [facility] where she was taken emergently to the catheterization lab. The patient states that during the cardiac catheterization she had a cardiac arrest with resuscitation. She had a prolonged hospital course. She had to have a tracheostomy. She suffered a laceration of her liver during the resuscitation. She apparently had a complication with the femoral artery catheterization and required repair of the artery. Her family was told that she might lose her leg but fortunately circulation was restored. With the STEMI she was treated with 2 stents. She was discharged on baby aspirin and on Brilinta and was told that she would be on those for 2 years. While in the hospital at [facility] she also developed a blood clot in her left leg and was placed on warfarin. She has been on warfarin since November following discharge from [facility] she developed respiratory insufficiency and a week later was admitted to [facility] with pneumonia which responded to antibiotic therapy. She then did well until February 18 when she developed chest pain and went to the [facility] emergency room where she underwent a CT angiogram of the chest which did not show any pulmonary emboli. She  continues on triple anticoagulation therapy. She had a two-dimensional echocardiogram at [facility] on 11/23/14 which showed an ejection fraction of 40% and mild to moderate mitral regurgitation. Multiple wall motion abnormalities were seen on echo. Her most recent EKG on 01/17/15 showed normal sinus rhythm with a pattern of an old anterior wall myocardial infarction. Her risk factors for coronary disease include being a diabetic. She is on metformin. She also has a history of hypercholesterolemia and essential hypertension. She has never been a smoker. Her family history reveals that both parents are deceased. Her father had a heart attack at age 32. Her mother had COPD and diabetes mellitus. 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 She has not returned to work since her heart attack. She is presently enjoying the outpatient cardiac rehabilitation program. He will finish up in July She has not been experiencing any exertional chest pain. She has not been experiencing any significant dyspnea on exertion. She has not been aware of any racing of her heart or palpitations. The patient developed leg discomfort on atorvastatin 80 mg daily.  However she is tolerating a lower dose of 40 mg daily without symptoms. Since last seen in the office the patient developed chest pain and was hospitalized briefly at [facility] from 06/05/15 until 06/06/15.  Her enzymes were negative 3.  CT angiogram of the chest did not reveal any pulmonary emboli or dissection.  Her echocardiogram showed an ejection fraction of 40% with mild mitral regurgitation.  Her EKG on 7/6-16 showed sinus tachycardia and questionable old anterior myocardial infarction.    Past Medical History  Diagnosis Date  . Renal disorder   . Coronary artery disease   . Liver laceration 10/19/14  . Pulmonary edema 10/19/14  . DVT (deep venous  thrombosis) 11/15    left femerol artery injury during resusciation.  [name] STEMI (ST elevation myocardial infarction) 10/19/14  . Hypertension   . High cholesterol   . Heart murmur   . HCAP (healthcare-associated pneumonia) 11/22/2014  . Sleep apnea     "suppose to wear mask but I don't" (11/22/2014)  . Type II diabetes mellitus   . History of blood transfusion 09/2014    related to MI  . GERD (gastroesophageal reflux disease)   . History of gout   . Depression     Past Surgical History  Procedure Laterality Date  . Cesarean section  1980; 1982  . Laparoscopic abdominal exploration    . Liver laceration repair  09/2014    related to CPR/notes 11/22/2014  . Tracheostomy  09/2014    "closed on it's own when they took it out"  . Femerol artery repair Right 09/2014    pt insists it was a right femoral artery repair on 11/22/2014  . Extracorporeal circulation    . Left ventricular assist device      pt is not aware of this hx on 11/22/2014  . Abdominal hysterectomy  ~ 2000  . Tubal ligation  1982  . Coronary angioplasty with stent placement  09/2014    "multiple"/notes 11/22/2014  . Application of wound vac  10/2014    "over my naval" (11/22/2014)     Current Outpatient Prescriptions  Medication Sig Dispense Refill  . acetaminophen (TYLENOL) 500 MG tablet Take 1,000 mg by mouth 2 (two) times daily as needed (pain).     [name] aspirin EC 81 MG tablet Take 1 tablet (81 mg total) by mouth daily.    [name] atorvastatin (LIPITOR) 40 MG tablet Take 1 tablet (40 mg total) by mouth daily at 6 PM. 30 tablet 0  . furosemide (LASIX) 40 MG tablet Take 1 tablet (40 mg total) by mouth daily at 12 noon. 30 tablet   . lisinopril (PRINIVIL,ZESTRIL) 10 MG tablet Take 1 tablet (10 mg total) by mouth daily.    . magnesium oxide (MAG-OX) 400 MG tablet Take 1,200 mg by mouth daily.     . metFORMIN (GLUCOPHAGE) 500 MG tablet Take 500 mg by mouth See admin instructions. Takes med only on Sun, Tues, Thurs and  Sat Takes skips meds on Mon, wed, and fri    . metoprolol tartrate (LOPRESSOR) 25 MG tablet Take 12.5 mg by mouth every 12 (twelve) hours.     . nitroGLYCERIN (NITROSTAT) 0.4 MG SL tablet Place 0.4 mg under the tongue every 5 (five) minutes as needed for chest pain. Take every 5 minutes for max of 3 if no relief call [phone]    . potassium chloride SA (K-DUR,KLOR-CON) 20 MEQ tablet Take 20 mEq by mouth daily.     . ticagrelor (BRILINTA) 90 MG TABS tablet Take 1 tablet (90 mg total) by mouth every 12 (twelve) hours. 60 tablet 0  . sertraline (ZOLOFT) 50 MG tablet Take 50 mg by mouth daily.     No current facility-administered medications for this visit.    Allergies:   Dilaudid; Epinephrine; and Lactose intolerance (gi)    Social History:  The patient  reports that she has never  smoked. She has never used smokeless tobacco. She reports that she does not drink alcohol or use illicit drugs.   Family History:  The patient's family history includes Alzheimer's disease in her father; COPD in her mother; Diabetes in her brother and mother; Heart attack in her father; Hypertension in her mother. There is no history of Stroke.    ROS:  Please see the history of present illness.   Otherwise, review of systems are positive for none.   All other systems are reviewed and negative.    PHYSICAL EXAM: VS:  BP 110/60 mmHg  Pulse 99  Ht 5\' 2"  (1.575 m)  Wt 177 lb (80.287 kg)  BMI 32.37 kg/m2  SpO2 91% , BMI Body mass index is 32.37 kg/(m^2). GEN: Well nourished, well developed, in no acute distress HEENT: normal Neck: no JVD, carotid bruits, or masses Cardiac: RRR; no  rubs, or gallops,no edema.  There is a grade 2/6 holosystolic murmur of mitral regurgitation at apex.  No phlebitis or edema.  Pulses are good.  Respiratory:  clear to auscultation bilaterally, normal work of breathing GI: soft, nontender, nondistended, + BS MS: no deformity or atrophy Skin: warm and dry, no rash Neuro:  Strength and  sensation are intact Psych: euthymic mood, full affect   EKG:  EKG is not ordered today.    Recent Labs: 06/05/2015: B Natriuretic Peptide 205.6* 06/06/2015: ALT 73*; BUN 11; Creatinine, Ser 0.64; Hemoglobin 10.9*; Platelets 242; Potassium 3.9; Sodium 131*    Lipid Panel    Component Value Date/Time   CHOL 172 06/06/2015 0457   TRIG 44 06/06/2015 0457   HDL 49 06/06/2015 0457   CHOLHDL 3.5 06/06/2015 0457   VLDL 9 06/06/2015 0457   LDLCALC 114* 06/06/2015 0457      Wt Readings from Last 3 Encounters:  06/13/15 177 lb (80.287 kg)  06/06/15 176 lb 9.6 oz (80.105 kg)  04/25/15 175 lb (79.379 kg)        ASSESSMENT AND PLAN:  1. Ischemic heart disease status post STEMI on November 20/2015 treated at [facility] with 2 drug-eluting stents. Cardiac catheterization was complicated by cardiac arrest and resuscitation and laceration of liver. She required tracheostomy.  Recent overnight observation in the hospital for chest pain with myocardial infarction ruled out. 2. 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 Mild to moderate mitral regurgitation by echocardiogram 11/23/14 4. Adult-onset diabetes mellitus 5. DVT of left leg, resolved.  Follow-up venous Dopplers were normal and warfarin has been stopped.  She is still on dual anti-platelet therapy for coronary disease.  Disposition: In view of her recent hospital admission we will follow-up with a treadmill Myoview stress test as an outpatient.   Current medicines are reviewed at length with the patient today.  The patient does not have concerns regarding medicines.  The following changes have been made:  no change  Labs/ tests ordered today include:   Orders Placed This Encounter  Procedures  . Myocardial Perfusion Imaging     Disposition:  Schedule treadmill Myoview stress test.  Recheck in 4 months for office visit and EKG.  She may return to cardiac rehabilitation on 06/17/15.  Berna Spare MD 06/13/2015 3:57 PM    [address], [place], [place]  [address] Phone: [phone]; Fax: [phone]

## 2015-06-13 NOTE — Patient Instructions (Addendum)
Medication Instructions:  Your physician recommends that you continue on your current medications as directed. Please refer to the Current Medication list given to you today.  Labwork: none  Testing/Procedures: Your physician has requested that you have en exercise stress myoview. For further information please visit [email or web address]. Please follow instruction sheet, as given.   Follow-Up: Your physician wants you to follow-up in: 4 month ov/ekg  You will receive a reminder letter in the mail two months in advance. If you don't receive a letter, please call our office to schedule the follow-up appointment.  OK TO RETURN TO CARDIAC REHAB ON 06/17/15

## 2015-06-14 ENCOUNTER — Encounter (HOSPITAL_COMMUNITY): Payer: BC Managed Care – PPO

## 2015-06-17 ENCOUNTER — Encounter (HOSPITAL_COMMUNITY)
Admission: RE | Admit: 2015-06-17 | Discharge: 2015-06-17 | Disposition: A | Discharge status: Home / Self Care | Payer: BC Managed Care – PPO | Source: Ambulatory Visit | Attending: Cardiology | Admitting: Cardiology

## 2015-06-17 DIAGNOSIS — Z48812 Encounter for surgical aftercare following surgery on the circulatory system: Secondary | ICD-10-CM | POA: Diagnosis not present

## 2015-06-17 LAB — GLUCOSE, CAPILLARY
Glucose-Capillary: 80 mg/dL (ref 65–99)
Glucose-Capillary: 103 mg/dL — ABNORMAL HIGH (ref 65–99)

## 2015-06-17 NOTE — Progress Notes (Signed)
Patient returned to exercise at cardiac rehab today per Dr Mare Ferrari. Marie Tanner exercised without difficulty or complaints.

## 2015-06-18 ENCOUNTER — Telehealth (HOSPITAL_COMMUNITY): Payer: Self-pay

## 2015-06-18 NOTE — Telephone Encounter (Signed)
Left message on voicemail in reference to upcoming appointment scheduled for 06-20-2015. Phone number given for a call back so details instructions can be given. Oletta Lamas, Fanchon Papania A

## 2015-06-19 ENCOUNTER — Encounter (HOSPITAL_COMMUNITY)
Admission: RE | Admit: 2015-06-19 | Discharge: 2015-06-19 | Disposition: A | Discharge status: Home / Self Care | Payer: BC Managed Care – PPO | Source: Ambulatory Visit | Attending: Cardiology | Admitting: Cardiology

## 2015-06-19 ENCOUNTER — Telehealth (HOSPITAL_COMMUNITY): Payer: Self-pay

## 2015-06-19 DIAGNOSIS — Z48812 Encounter for surgical aftercare following surgery on the circulatory system: Secondary | ICD-10-CM | POA: Diagnosis not present

## 2015-06-19 LAB — GLUCOSE, CAPILLARY: Glucose-Capillary: 117 mg/dL — ABNORMAL HIGH (ref 65–99)

## 2015-06-19 NOTE — Telephone Encounter (Signed)
Left message on voicemail in reference to upcoming appointment scheduled for 06-20-2015. Phone number given for a call back so details instructions can be given. Oletta Lamas, Bradyn Vassey A

## 2015-06-20 ENCOUNTER — Ambulatory Visit (HOSPITAL_COMMUNITY): Payer: BC Managed Care – PPO | Attending: Internal Medicine

## 2015-06-20 DIAGNOSIS — R9439 Abnormal result of other cardiovascular function study: Secondary | ICD-10-CM | POA: Insufficient documentation

## 2015-06-20 DIAGNOSIS — I240 Acute coronary thrombosis not resulting in myocardial infarction: Secondary | ICD-10-CM | POA: Diagnosis not present

## 2015-06-20 DIAGNOSIS — I259 Chronic ischemic heart disease, unspecified: Secondary | ICD-10-CM

## 2015-06-20 LAB — MYOCARDIAL PERFUSION IMAGING
CHL CUP MPHR: 164 {beats}/min
CHL CUP NUCLEAR SRS: 12
CHL CUP RESTING HR STRESS: 73 {beats}/min
CSEPEDS: 0 s
CSEPEW: 7 METS
Exercise duration (min): 6 min
LV dias vol: 104 mL
LVSYSVOL: 56 mL
NUC STRESS TID: 0.98
Peak HR: 157 {beats}/min
Percent HR: 95 %
RATE: 0.33
RPE: 18
SDS: 5
SSS: 15

## 2015-06-20 MED ORDER — TECHNETIUM TC 99M SESTAMIBI GENERIC - CARDIOLITE
32.7000 | Freq: Once | INTRAVENOUS | Status: AC | PRN
  Administered 2015-06-20: 32.7 via INTRAVENOUS

## 2015-06-20 MED ORDER — TECHNETIUM TC 99M SESTAMIBI GENERIC - CARDIOLITE
10.9000 | Freq: Once | INTRAVENOUS | Status: AC | PRN
  Administered 2015-06-20: 10.9 via INTRAVENOUS

## 2015-06-21 ENCOUNTER — Encounter (HOSPITAL_COMMUNITY)
Admission: RE | Admit: 2015-06-21 | Discharge: 2015-06-21 | Disposition: A | Discharge status: Home / Self Care | Payer: BC Managed Care – PPO | Source: Ambulatory Visit | Attending: Cardiology | Admitting: Cardiology

## 2015-06-21 DIAGNOSIS — Z48812 Encounter for surgical aftercare following surgery on the circulatory system: Secondary | ICD-10-CM | POA: Diagnosis not present

## 2015-06-21 LAB — GLUCOSE, CAPILLARY: Glucose-Capillary: 141 mg/dL — ABNORMAL HIGH (ref 65–99)

## 2015-06-24 ENCOUNTER — Encounter (HOSPITAL_COMMUNITY)
Admission: RE | Admit: 2015-06-24 | Discharge: 2015-06-24 | Disposition: A | Discharge status: Home / Self Care | Payer: BC Managed Care – PPO | Source: Ambulatory Visit | Attending: Cardiology | Admitting: Cardiology

## 2015-06-24 DIAGNOSIS — Z48812 Encounter for surgical aftercare following surgery on the circulatory system: Secondary | ICD-10-CM | POA: Diagnosis not present

## 2015-06-24 LAB — GLUCOSE, CAPILLARY: Glucose-Capillary: 122 mg/dL — ABNORMAL HIGH (ref 65–99)

## 2015-06-25 ENCOUNTER — Ambulatory Visit: Payer: BC Managed Care – PPO | Admitting: Cardiology

## 2015-06-26 ENCOUNTER — Encounter (HOSPITAL_COMMUNITY)
Admission: RE | Admit: 2015-06-26 | Discharge: 2015-06-26 | Disposition: A | Discharge status: Home / Self Care | Payer: BC Managed Care – PPO | Source: Ambulatory Visit | Attending: Cardiology | Admitting: Cardiology

## 2015-06-26 DIAGNOSIS — Z48812 Encounter for surgical aftercare following surgery on the circulatory system: Secondary | ICD-10-CM | POA: Diagnosis not present

## 2015-06-26 LAB — GLUCOSE, CAPILLARY: Glucose-Capillary: 132 mg/dL — ABNORMAL HIGH (ref 65–99)

## 2015-06-26 NOTE — Progress Notes (Signed)
Pt graduates from cardiac rehab program on 07/08/2015 with completion of 36 exercise sessions in Phase II. Pt maintained good attendance and progressed nicely during his participation in rehab as evidenced by increased MET level.   Medication list reconciled. Repeat  PHQ score-  0.  Pt has made significant lifestyle changes and should be commended for her success. Pt feels she has achieved her goals during cardiac rehab.Vernee says she has more confidence to exercise since participating in phase 2 cardiac rehab.  Pt plans to continue exercise at the [facility] and water aerobics. Phaedra reports that her depression is better all though  she still experiences a down day at times. Magaby is not taking an antidepressant at the time due to elevated liver levels. Veleka did benefit from meeting with the hospital chaplain. Vanna has an  Appointment with Dr Clemmie Krill her primary care physician. Melvinia wants to discuss the possibility returning to work in the fall and will also discuss the possibility of counseling in the future.

## 2015-06-28 ENCOUNTER — Encounter (HOSPITAL_COMMUNITY): Payer: BC Managed Care – PPO

## 2015-07-01 ENCOUNTER — Encounter (HOSPITAL_COMMUNITY)
Admission: RE | Admit: 2015-07-01 | Discharge: 2015-07-01 | Disposition: A | Discharge status: Home / Self Care | Payer: BC Managed Care – PPO | Source: Ambulatory Visit | Attending: Cardiology | Admitting: Cardiology

## 2015-07-01 DIAGNOSIS — I252 Old myocardial infarction: Secondary | ICD-10-CM | POA: Insufficient documentation

## 2015-07-01 DIAGNOSIS — Z48812 Encounter for surgical aftercare following surgery on the circulatory system: Secondary | ICD-10-CM | POA: Diagnosis present

## 2015-07-01 DIAGNOSIS — Z955 Presence of coronary angioplasty implant and graft: Secondary | ICD-10-CM | POA: Diagnosis not present

## 2015-07-01 DIAGNOSIS — E119 Type 2 diabetes mellitus without complications: Secondary | ICD-10-CM | POA: Insufficient documentation

## 2015-07-01 LAB — GLUCOSE, CAPILLARY: Glucose-Capillary: 111 mg/dL — ABNORMAL HIGH (ref 65–99)

## 2015-07-03 ENCOUNTER — Encounter (HOSPITAL_COMMUNITY): Payer: BC Managed Care – PPO

## 2015-07-05 ENCOUNTER — Encounter (HOSPITAL_COMMUNITY): Payer: BC Managed Care – PPO

## 2015-07-08 ENCOUNTER — Encounter (HOSPITAL_COMMUNITY)
Admission: RE | Admit: 2015-07-08 | Discharge: 2015-07-08 | Disposition: A | Discharge status: Home / Self Care | Payer: BC Managed Care – PPO | Source: Ambulatory Visit | Attending: Cardiology | Admitting: Cardiology

## 2015-07-08 DIAGNOSIS — Z48812 Encounter for surgical aftercare following surgery on the circulatory system: Secondary | ICD-10-CM | POA: Diagnosis not present

## 2015-07-08 LAB — GLUCOSE, CAPILLARY: Glucose-Capillary: 101 mg/dL — ABNORMAL HIGH (ref 65–99)

## 2015-07-10 ENCOUNTER — Encounter (HOSPITAL_COMMUNITY)
Admission: RE | Admit: 2015-07-10 | Discharge: 2015-07-10 | Disposition: A | Discharge status: Home / Self Care | Payer: BC Managed Care – PPO | Source: Ambulatory Visit | Attending: Cardiology | Admitting: Cardiology

## 2015-07-10 DIAGNOSIS — Z48812 Encounter for surgical aftercare following surgery on the circulatory system: Secondary | ICD-10-CM | POA: Diagnosis not present

## 2015-07-12 ENCOUNTER — Encounter (HOSPITAL_COMMUNITY)
Admission: RE | Admit: 2015-07-12 | Discharge: 2015-07-12 | Disposition: A | Discharge status: Home / Self Care | Payer: BC Managed Care – PPO | Source: Ambulatory Visit | Attending: Cardiology | Admitting: Cardiology

## 2015-07-12 DIAGNOSIS — Z48812 Encounter for surgical aftercare following surgery on the circulatory system: Secondary | ICD-10-CM | POA: Diagnosis not present

## 2015-07-12 LAB — GLUCOSE, CAPILLARY: Glucose-Capillary: 121 mg/dL — ABNORMAL HIGH (ref 65–99)

## 2015-07-12 NOTE — Progress Notes (Signed)
Marie Tanner graduates from cardiac rehab today.

## 2015-07-15 ENCOUNTER — Encounter (HOSPITAL_COMMUNITY): Payer: BC Managed Care – PPO

## 2015-07-17 ENCOUNTER — Encounter (HOSPITAL_COMMUNITY): Payer: BC Managed Care – PPO

## 2015-07-19 ENCOUNTER — Encounter (HOSPITAL_COMMUNITY): Payer: BC Managed Care – PPO

## 2015-07-22 ENCOUNTER — Telehealth: Payer: Self-pay | Admitting: Cardiology

## 2015-07-22 ENCOUNTER — Encounter (HOSPITAL_COMMUNITY): Payer: BC Managed Care – PPO

## 2015-07-22 NOTE — Telephone Encounter (Signed)
New Message  Ms. Diosdado called requests a call back to discuss if the appt on 07/24/2015 with [name] is needed. She states that she was advised per [name] that the appt was not needed at this time. Please call back to clarify.

## 2015-07-22 NOTE — Telephone Encounter (Signed)
Pt called as she does not need appt this week but one in November.  Pt had OV in May and needed 3 month f/u but pt was hospitalized and saw [name] on 06/13/2015 and needed 4 month f/u from that so does not need 07/24/2015 appt any longer.  Pt 07/24/2015 appt cancelled and setup for 11/16 @ 8:30AM.  Pt aware of changes and no additional questions at this time.

## 2015-07-24 ENCOUNTER — Ambulatory Visit: Payer: BC Managed Care – PPO | Admitting: Cardiology

## 2015-07-24 ENCOUNTER — Encounter (HOSPITAL_COMMUNITY): Payer: BC Managed Care – PPO

## 2015-07-26 ENCOUNTER — Encounter (HOSPITAL_COMMUNITY): Payer: BC Managed Care – PPO

## 2015-07-29 ENCOUNTER — Encounter (HOSPITAL_COMMUNITY): Payer: BC Managed Care – PPO

## 2015-07-31 ENCOUNTER — Encounter (HOSPITAL_COMMUNITY): Payer: BC Managed Care – PPO

## 2015-08-02 ENCOUNTER — Encounter (HOSPITAL_COMMUNITY): Payer: BC Managed Care – PPO

## 2015-08-05 ENCOUNTER — Encounter (HOSPITAL_COMMUNITY): Payer: BC Managed Care – PPO

## 2015-08-07 ENCOUNTER — Encounter (HOSPITAL_COMMUNITY): Payer: BC Managed Care – PPO

## 2015-08-09 ENCOUNTER — Encounter (HOSPITAL_COMMUNITY): Payer: BC Managed Care – PPO

## 2015-08-12 ENCOUNTER — Encounter (HOSPITAL_COMMUNITY): Payer: BC Managed Care – PPO

## 2015-08-14 ENCOUNTER — Encounter (HOSPITAL_COMMUNITY): Payer: BC Managed Care – PPO

## 2015-08-16 ENCOUNTER — Encounter (HOSPITAL_COMMUNITY): Payer: BC Managed Care – PPO

## 2015-08-19 ENCOUNTER — Encounter (HOSPITAL_COMMUNITY): Payer: BC Managed Care – PPO

## 2015-08-21 ENCOUNTER — Encounter (HOSPITAL_COMMUNITY): Payer: BC Managed Care – PPO

## 2015-08-23 ENCOUNTER — Encounter (HOSPITAL_COMMUNITY): Payer: BC Managed Care – PPO

## 2015-08-26 ENCOUNTER — Encounter (HOSPITAL_COMMUNITY): Payer: BC Managed Care – PPO

## 2015-08-28 ENCOUNTER — Encounter (HOSPITAL_COMMUNITY): Payer: BC Managed Care – PPO

## 2015-08-30 ENCOUNTER — Encounter (HOSPITAL_COMMUNITY): Payer: BC Managed Care – PPO

## 2015-09-02 ENCOUNTER — Encounter (HOSPITAL_COMMUNITY): Payer: BC Managed Care – PPO

## 2015-09-04 ENCOUNTER — Encounter (HOSPITAL_COMMUNITY): Payer: BC Managed Care – PPO

## 2015-09-06 ENCOUNTER — Encounter (HOSPITAL_COMMUNITY): Payer: BC Managed Care – PPO

## 2015-09-09 ENCOUNTER — Encounter (HOSPITAL_COMMUNITY): Payer: BC Managed Care – PPO

## 2015-10-16 ENCOUNTER — Encounter: Payer: Self-pay | Admitting: Cardiology

## 2015-10-16 ENCOUNTER — Ambulatory Visit (INDEPENDENT_AMBULATORY_CARE_PROVIDER_SITE_OTHER): Payer: BC Managed Care – PPO | Admitting: Cardiology

## 2015-10-16 VITALS — BP 128/80 | HR 77 | Ht 62.5 in | Wt 177.8 lb

## 2015-10-16 DIAGNOSIS — I259 Chronic ischemic heart disease, unspecified: Secondary | ICD-10-CM

## 2015-10-16 DIAGNOSIS — R0602 Shortness of breath: Secondary | ICD-10-CM | POA: Diagnosis not present

## 2015-10-16 DIAGNOSIS — I340 Nonrheumatic mitral (valve) insufficiency: Secondary | ICD-10-CM | POA: Diagnosis not present

## 2015-10-16 DIAGNOSIS — E7800 Pure hypercholesterolemia, unspecified: Secondary | ICD-10-CM

## 2015-10-16 DIAGNOSIS — I240 Acute coronary thrombosis not resulting in myocardial infarction: Secondary | ICD-10-CM | POA: Diagnosis not present

## 2015-10-16 DIAGNOSIS — I519 Heart disease, unspecified: Secondary | ICD-10-CM

## 2015-10-16 LAB — HEPATIC FUNCTION PANEL
ALBUMIN: 3.9 g/dL (ref 3.6–5.1)
ALT: 24 U/L (ref 6–29)
AST: 22 U/L (ref 10–35)
Alkaline Phosphatase: 86 U/L (ref 33–130)
BILIRUBIN DIRECT: 0.1 mg/dL (ref <=0.2)
BILIRUBIN TOTAL: 0.7 mg/dL (ref 0.2–1.2)
Indirect Bilirubin: 0.6 mg/dL (ref 0.2–1.2)
Total Protein: 8.6 g/dL — ABNORMAL HIGH (ref 6.1–8.1)

## 2015-10-16 LAB — BASIC METABOLIC PANEL
BUN: 10 mg/dL (ref 7–25)
CO2: 28 mmol/L (ref 20–31)
Calcium: 9.6 mg/dL (ref 8.6–10.4)
Chloride: 103 mmol/L (ref 98–110)
Creat: 0.65 mg/dL (ref 0.50–1.05)
Glucose, Bld: 83 mg/dL (ref 65–99)
Potassium: 4 mmol/L (ref 3.5–5.3)
SODIUM: 136 mmol/L (ref 135–146)

## 2015-10-16 LAB — CBC WITH DIFFERENTIAL/PLATELET
BASOS PCT: 0 % (ref 0–1)
Basophils Absolute: 0 10*3/uL (ref 0.0–0.1)
Eosinophils Absolute: 0.2 10*3/uL (ref 0.0–0.7)
Eosinophils Relative: 5 % (ref 0–5)
HCT: 36.1 % (ref 36.0–46.0)
HEMOGLOBIN: 12.3 g/dL (ref 12.0–15.0)
Lymphocytes Relative: 42 % (ref 12–46)
Lymphs Abs: 1.5 10*3/uL (ref 0.7–4.0)
MCH: 28.5 pg (ref 26.0–34.0)
MCHC: 34.1 g/dL (ref 30.0–36.0)
MCV: 83.8 fL (ref 78.0–100.0)
MONOS PCT: 15 % — AB (ref 3–12)
MPV: 9.6 fL (ref 8.6–12.4)
Monocytes Absolute: 0.5 10*3/uL (ref 0.1–1.0)
NEUTROS ABS: 1.4 10*3/uL — AB (ref 1.7–7.7)
NEUTROS PCT: 38 % — AB (ref 43–77)
PLATELETS: 281 10*3/uL (ref 150–400)
RBC: 4.31 MIL/uL (ref 3.87–5.11)
RDW: 13.8 % (ref 11.5–15.5)
WBC: 3.6 10*3/uL — AB (ref 4.0–10.5)

## 2015-10-16 LAB — LIPID PANEL
CHOL/HDL RATIO: 3.6 ratio (ref <=5.0)
Cholesterol: 189 mg/dL (ref 125–200)
HDL: 53 mg/dL (ref >=46)
LDL Cholesterol: 118 mg/dL (ref <130)
Triglycerides: 91 mg/dL (ref <150)
VLDL: 18 mg/dL (ref <30)

## 2015-10-16 MED ORDER — ROSUVASTATIN CALCIUM 40 MG PO TABS: 40.0000 mg | ORAL_TABLET | Freq: Every day | ORAL | Status: DC

## 2015-10-16 MED ORDER — METOPROLOL SUCCINATE ER 25 MG PO TB24: 25.0000 mg | ORAL_TABLET | Freq: Every day | ORAL | Status: DC

## 2015-10-16 NOTE — Progress Notes (Signed)
Cardiology Office Note   Date:  10/16/2015   ID:  Marie Tanner, DOB [birth year], MRN [id number]  PCP:  Lynnell Jude, MD  Cardiologist: Darlin Coco MD  Chief Complaint  Patient presents with  . Mitral Regurgitation      History of Present Illness: Marie Tanner is a 59 y.o. female who presents for scheduled four-month visit.  She was initially seen at the request of Dr. Rich Number in [place]. She was seen to establish cardiology care here in [place] and also to provide entrance to the [facility] cardiac rehabilitation program. She has a complex past cardiac history. She was previously living in the [place] area. On 10/19/2014 she developed chest pain and was taken by ambulance to [facility] where she was taken emergently to the catheterization lab. The patient states that during the cardiac catheterization she had a cardiac arrest with resuscitation. She had a prolonged hospital course. She had to have a tracheostomy. She suffered a laceration of her liver during the resuscitation. She apparently had a complication with the femoral artery catheterization and required repair of the artery. Her family was told that she might lose her leg but fortunately circulation was restored. With the STEMI she was treated with 2 stents. She was discharged on baby aspirin and on Brilinta and was told that she would be on those for 2 years. While in the hospital at [facility] she also developed a blood clot in her left leg and was placed on warfarin. She has been on warfarin since November following discharge from [facility] she developed respiratory insufficiency and a week later was admitted to [facility] with pneumonia which responded to antibiotic therapy. She then did well until February 18 when she developed chest pain and went to the [facility] emergency room where she underwent a CT angiogram of the chest which did not show any pulmonary emboli. She is  no longer on triple anticoagulation therapy.. She had a two-dimensional echocardiogram at [facility] on 11/23/14 which showed an ejection fraction of 40% and mild to moderate mitral regurgitation. Multiple wall motion abnormalities were seen on echo. Her most recent EKG on 01/17/15 showed normal sinus rhythm with a pattern of an old anterior wall myocardial infarction. Her risk factors for coronary disease include being a diabetic. She is on metformin. She also has a history of hypercholesterolemia and essential hypertension. She has never been a smoker. Her family history reveals that both parents are deceased. Her father had a heart attack at age 42. Her mother had COPD and diabetes mellitus. 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 She has occasional sub-sternal chest discomfort for which she takes sublingual nitroglycerin with relief. The patient developed leg discomfort on atorvastatin 80 mg daily. However she is tolerating a lower dose of 40 mg daily without symptoms. Since last seen in the office the patient developed chest pain and was hospitalized briefly at [facility] from 06/05/15 until 06/06/15. Her enzymes were negative 3. CT angiogram of the chest did not reveal any pulmonary emboli or dissection. Her echocardiogram showed an ejection fraction of 40% with mild mitral regurgitation. Her EKG on 7/6-16 showed sinus tachycardia and questionable old anterior myocardial infarction.   Past Medical History  Diagnosis Date  . Renal disorder   .  Coronary artery disease   . Liver laceration 10/19/14  . Pulmonary edema 10/19/14  . DVT (deep venous thrombosis) ([place]) 11/15    left femerol artery injury during resusciation.  [name] STEMI (ST elevation myocardial infarction)  ([place]) 10/19/14  . Hypertension   . High cholesterol   . Heart murmur   . HCAP (healthcare-associated pneumonia) 11/22/2014  . Sleep apnea     "suppose to wear mask but I don't" (11/22/2014)  . Type II diabetes mellitus ([place])   . History of blood transfusion 09/2014    related to MI  . GERD (gastroesophageal reflux disease)   . History of gout   . Depression     Past Surgical History  Procedure Laterality Date  . Cesarean section  1980; 1982  . Laparoscopic abdominal exploration    . Liver laceration repair  09/2014    related to CPR/notes 11/22/2014  . Tracheostomy  09/2014    "closed on it's own when they took it out"  . Femerol artery repair Right 09/2014    pt insists it was a right femoral artery repair on 11/22/2014  . Extracorporeal circulation    . Left ventricular assist device      pt is not aware of this hx on 11/22/2014  . Abdominal hysterectomy  ~ 2000  . Tubal ligation  1982  . Coronary angioplasty with stent placement  09/2014    "multiple"/notes 11/22/2014  . Application of wound vac  10/2014    "over my naval" (11/22/2014)     Current Outpatient Prescriptions  Medication Sig Dispense Refill  . acetaminophen (TYLENOL) 500 MG tablet Take 1,000 mg by mouth 2 (two) times daily as needed (pain).     [name] aspirin EC 81 MG tablet Take 1 tablet (81 mg total) by mouth daily.    . furosemide (LASIX) 40 MG tablet Take 1 tablet (40 mg total) by mouth daily at 12 noon. 30 tablet   . losartan (COZAAR) 25 MG tablet Take 25 mg by mouth daily.    . magnesium oxide (MAG-OX) 400 MG tablet Take 1,200 mg by mouth daily.     . metFORMIN (GLUCOPHAGE) 500 MG tablet Take 500 mg by mouth See admin instructions. Takes med only on Sun, Tues, Thurs and Sat Takes skips meds on Mon, wed, and fri    . nitroGLYCERIN (NITROSTAT) 0.4 MG SL tablet Place 0.4 mg under the tongue every 5 (five) minutes as needed for chest pain. Take every 5 minutes for max of 3 if no relief call 911    .  potassium chloride SA (K-DUR,KLOR-CON) 20 MEQ tablet Take 20 mEq by mouth daily.     . sertraline (ZOLOFT) 50 MG tablet Take 50 mg by mouth daily.    . ticagrelor (BRILINTA) 90 MG TABS tablet Take 1 tablet (90 mg total) by mouth every 12 (twelve) hours. 60 tablet 0  . metoprolol succinate (TOPROL XL) 25 MG 24 hr tablet Take 1 tablet (25 mg total) by mouth daily. 30 tablet 5  . rosuvastatin (CRESTOR) 40 MG tablet Take 1 tablet (40 mg total) by mouth daily. 30 tablet 5   No current facility-administered medications for this visit.    Allergies:   Dilaudid; Hydromorphone; Epinephrine; and Lactose intolerance (gi)    Social History:  The patient  reports that she has never smoked. She has never used smokeless tobacco. She reports that she does not drink alcohol or use illicit drugs.   Family History:  The patient's family history  includes Alzheimer's disease in her father; COPD in her mother; Diabetes in her brother and mother; Heart attack in her father; Hypertension in her mother. There is no history of Stroke.    ROS:  Please see the history of present illness.   Otherwise, review of systems are positive for none.   All other systems are reviewed and negative.    PHYSICAL EXAM: VS:  BP 128/80 mmHg  Pulse 77  Ht 5' 2.5" (1.588 m)  Wt 177 lb 12.8 oz (80.65 kg)  BMI 31.98 kg/m2 , BMI Body mass index is 31.98 kg/(m^2). GEN: Well nourished, well developed, in no acute distress HEENT: normal Neck: no JVD, carotid bruits, or masses Cardiac: RRR; grade 3/6 holosystolic murmur of mitral regurgitation at apex; no, rubs, or gallops,no edema  Respiratory:  clear to auscultation bilaterally, normal work of breathing GI: soft, nontender, nondistended, + BS MS: no deformity or atrophy Skin: warm and dry, no rash Neuro:  Strength and sensation are intact Psych: euthymic mood, full affect   EKG:  EKG is ordered today. The ekg ordered today demonstrates normal sinus rhythm at 77 bpm.  Old  inferior wall myocardial infarction.   Recent Labs: 06/05/2015: B Natriuretic Peptide 205.6* 06/06/2015: ALT 73*; BUN 11; Creatinine, Ser 0.64; Hemoglobin 10.9*; Platelets 242; Potassium 3.9; Sodium 131*    Lipid Panel    Component Value Date/Time   CHOL 172 06/06/2015 0457   TRIG 44 06/06/2015 0457   HDL 49 06/06/2015 0457   CHOLHDL 3.5 06/06/2015 0457   VLDL 9 06/06/2015 0457   LDLCALC 114* 06/06/2015 0457      Wt Readings from Last 3 Encounters:  10/16/15 177 lb 12.8 oz (80.65 kg)  06/20/15 177 lb (80.287 kg)  06/13/15 177 lb (80.287 kg)         ASSESSMENT AND PLAN:  1. Ischemic heart disease status post STEMI on November 20/2015 treated at [facility] with 2 drug-eluting stents. Cardiac catheterization was complicated by cardiac arrest and resuscitation and laceration of liver. She required tracheostomy. Recent overnight observation in the hospital for chest pain with myocardial infarction ruled out. 2. Left ventricular systolic dysfunction with ejection fraction 46% by Myoview on 06/20/15.  There was evidence of old myocardial infarction scar but no ischemia 3. Mild to moderate mitral regurgitation by echocardiogram 11/23/14 4. Adult-onset diabetes mellitus 5. DVT of left leg, resolved. Follow-up venous Dopplers were normal and warfarin has been stopped. She is still on dual anti-platelet therapy for coronary disease.   Current medicines are reviewed at length with the patient today.  The patient does not have concerns regarding medicines.  The following changes have been made:  In view of her decreased left ventricular systolic function, we will stop Lopressor and switch to Toprol-XL 25 one daily  Labs/ tests ordered today include:   Orders Placed This Encounter  Procedures  . Lipid panel  . Hepatic function panel  . Basic metabolic panel  . CBC with Differential/Platelet  . EKG 12-Lead     Disposition: We will stop Lopressor and use Toprol-XL 25 mg daily.   Also her lipids are above target and she is currently off her a statin.  We are going to switch her to rosuvastatin 40 mg daily.  Her PCP had previously taken her off of her a statin because of jaw pain.  She'll be rechecked in 4 months for office visit lipid panel hepatic function panel and nasal metabolic panel  Signed, Darlin Coco MD 10/16/2015 1:58 PM  [facility] [address], [place], [place]  [address] Phone: [phone]; Fax: [phone]

## 2015-10-16 NOTE — Progress Notes (Signed)
Quick Note:  Please report to patient. The recent labs are stable. Continue same medication and careful diet. Kidney function is improved. The hemoglobin is improved. The liver tests have returned to normal. ______

## 2015-10-16 NOTE — Patient Instructions (Signed)
Medication Instructions:  STOP LOPRESSOR (METOPROLOL TART)  START TOPROL XL (METOPROLOL SUCC) 25 MG DAILY  START CRESTOR 40 MG DAILY   Labwork: LP/BMET/HFP  Testing/Procedures: NONE  Follow-Up: Your physician recommends that you schedule a follow-up appointment in: 4 months with fasting labs (lp/bmet/hfp) WITH Tera Helper NP OR SCOTT W PA   Any Other Special Instructions Will Be Listed Below (If Applicable).     If you need a refill on your cardiac medications before your next appointment, please call your pharmacy.

## 2015-12-01 HISTORY — PX: BREAST LUMPECTOMY: SHX2

## 2016-01-17 ENCOUNTER — Telehealth: Payer: Self-pay | Admitting: Cardiology

## 2016-01-17 DIAGNOSIS — R928 Other abnormal and inconclusive findings on diagnostic imaging of breast: Secondary | ICD-10-CM | POA: Insufficient documentation

## 2016-01-17 NOTE — Telephone Encounter (Signed)
New message ° ° ° ° °Talk to Melinda °

## 2016-01-17 NOTE — Telephone Encounter (Signed)
Patient called wanting to know if she could hold her Brilinta and ASA for a breast biopsy on 01/20/16 Discussed with  [name] and ok to hold Brilinta but continue ASA  Advised patient, verbalized understanding

## 2016-01-24 DIAGNOSIS — Z853 Personal history of malignant neoplasm of breast: Secondary | ICD-10-CM | POA: Insufficient documentation

## 2016-01-24 DIAGNOSIS — C50919 Malignant neoplasm of unspecified site of unspecified female breast: Secondary | ICD-10-CM | POA: Insufficient documentation

## 2016-02-07 ENCOUNTER — Telehealth: Payer: Self-pay | Admitting: Nurse Practitioner

## 2016-02-07 NOTE — Telephone Encounter (Signed)
Marie Tanner is calling to find out if Marie Tanner is can be moved up because she is having breast cancer surgery on 02/17/16.

## 2016-02-10 NOTE — Telephone Encounter (Signed)
S/w pt moved pts appointment up for tomorrow due to upcoming surgery

## 2016-02-10 NOTE — Telephone Encounter (Signed)
lvm to let cancer center know pt can be moved up tomorrow if that is possible. Waiting to hear back

## 2016-02-11 ENCOUNTER — Ambulatory Visit (INDEPENDENT_AMBULATORY_CARE_PROVIDER_SITE_OTHER): Payer: BC Managed Care – PPO | Admitting: Nurse Practitioner

## 2016-02-11 ENCOUNTER — Encounter: Payer: Self-pay | Admitting: Nurse Practitioner

## 2016-02-11 VITALS — BP 120/74 | HR 64 | Ht 62.5 in | Wt 177.8 lb

## 2016-02-11 DIAGNOSIS — I519 Heart disease, unspecified: Secondary | ICD-10-CM

## 2016-02-11 DIAGNOSIS — I259 Chronic ischemic heart disease, unspecified: Secondary | ICD-10-CM

## 2016-02-11 DIAGNOSIS — I240 Acute coronary thrombosis not resulting in myocardial infarction: Secondary | ICD-10-CM | POA: Diagnosis not present

## 2016-02-11 NOTE — Patient Instructions (Addendum)
We will be checking the following labs today - NONE   Medication Instructions:    Continue with your current medicines.   Do not stop your aspirin  Stay on your metoprolol thru your surgery.    Testing/Procedures To Be Arranged:  N/A  Follow-Up:   See Dr. Oval Linsey for follow up in about 4 weeks - will need discussion about chemo/echocardiogram, etc.     Other Special Instructions:   N/A    If you need a refill on your cardiac medications before your next appointment, please call your pharmacy.   Call the [place] office at [phone] if you have any questions, problems or concerns.

## 2016-02-11 NOTE — Progress Notes (Signed)
CARDIOLOGY OFFICE NOTE  Date:  02/11/2016    Marie Tanner Date of Birth: [birth year] Medical Record #[id number]  PCP:  Lynnell Jude, MD  Cardiologist:  Former patient of Dr. Sherryl Barters. To establish with Dr. Oval Linsey.     Chief Complaint  Patient presents with  . Pre-op Exam  . Coronary Artery Disease  . Cardiomyopathy    Pre op visit - seen for [name]    History of Present Illness: Marie Tanner is a 60 y.o. female who presents today for a pre op visit. Former patient of Dr. Sherryl Barters. She is to establish with Dr. Oval Linsey going forward.   She has HTN, HLD, DM, obesity and CAD. She has a complex past cardiac history. She was previously living in the [place] area. On 10/19/2014 she developed chest pain and was taken by ambulance to [facility] where she was taken emergently to the catheterization lab. The patient states that during the cardiac catheterization she had a cardiac arrest with resuscitation. She had a prolonged hospital course.She had [facility], had an Impella device. She had to have a tracheostomy. She suffered a laceration of her liver during the resuscitation. She apparently had a complication with the femoral artery catheterization and required repair of the artery. She thrombosed her left main and had stent placement to the left main and to the LCX.  With the STEMI she was treated with 2 stents - L main/LAD and LCX. She was discharged on triple anticoagulation - aspirin, brilinta and Coumadin (had DVT in the leg).   She developed chest pain in February of 2016 and went to the ER at [facility] where she underwent a CT angiogram of the chest which did not show any pulmonary emboli.   She developed chest pain again and was hospitalized briefly at [facility] from 06/05/15 until 06/06/15. Her enzymes were negative 3. CT angiogram of the chest did not reveal any pulmonary emboli or dissection. Her echocardiogram showed an ejection  fraction of 40% with mild mitral regurgitation. Myoview was updated and noted 2 areas of infarct but no ischemia and mild LV dysfunction.   She was last seen back in November and felt to be doing ok despite her multiple issues.   Comes back today. Here alone. Here for pre op clearance for breast surgery - has stage 2 breast cancer - to be done at [facility] on March 21st. Her Kary Kos has already been stopped. She remains on her aspirin. She is partially disabled. She does fatigue easily. No exertional chest pain. She will have some dyspnea occasionally with a fleeting chest pain (last for just a second or two). Nothing like her prior chest pain syndrome which was more like indigestion. She is working part time. Not as active as she would like to be - but does light housekeeping, shopping, etc. Not dizzy or lightheaded. Has had some vague aching in her legs - ?statin related. She is on Crestor. Last LDL 118.   Past Medical History  Diagnosis Date  . Renal disorder   . Coronary artery disease   . Liver laceration 10/19/14  . Pulmonary edema 10/19/14  . DVT (deep venous thrombosis) ([place]) 11/15    left femerol artery injury during resusciation.  [name] STEMI (ST elevation myocardial infarction) ([place]) 10/19/14  . Hypertension   . High cholesterol   . Heart murmur   . HCAP (healthcare-associated pneumonia) 11/22/2014  . Sleep apnea     "suppose to wear mask but  I don't" (11/22/2014)  . Type II diabetes mellitus ([place])   . History of blood transfusion 09/2014    related to MI  . GERD (gastroesophageal reflux disease)   . History of gout   . Depression     Past Surgical History  Procedure Laterality Date  . Cesarean section  1980; 1982  . Laparoscopic abdominal exploration    . Liver laceration repair  09/2014    related to CPR/notes 11/22/2014  . Tracheostomy  09/2014    "closed on it's own when they took it out"  . Femerol artery repair Right 09/2014    pt insists it was a right femoral artery  repair on 11/22/2014  . Extracorporeal circulation    . Left ventricular assist device      pt is not aware of this hx on 11/22/2014  . Abdominal hysterectomy  ~ 2000  . Tubal ligation  1982  . Coronary angioplasty with stent placement  09/2014    "multiple"/notes 11/22/2014  . Application of wound vac  10/2014    "over my naval" (11/22/2014)     Medications: Current Outpatient Prescriptions  Medication Sig Dispense Refill  . acetaminophen (TYLENOL) 500 MG tablet Take 1,000 mg by mouth 2 (two) times daily as needed (pain).     [name] albuterol (PROAIR HFA) 108 (90 Base) MCG/ACT inhaler Inhale 2 puffs into the lungs as needed for wheezing.     [name] aspirin EC 81 MG tablet Take 1 tablet (81 mg total) by mouth daily.    . furosemide (LASIX) 40 MG tablet Take 1 tablet (40 mg total) by mouth daily at 12 noon. 30 tablet   . losartan (COZAAR) 25 MG tablet Take 25 mg by mouth daily.    . magnesium oxide (MAG-OX) 400 MG tablet Take 1,200 mg by mouth daily.     . metFORMIN (GLUCOPHAGE) 500 MG tablet Take 500 mg by mouth See admin instructions. Takes med only on Sun, Tues, Thurs and Sat Takes skips meds on Mon, wed, and fri    . metFORMIN (GLUCOPHAGE-XR) 500 MG 24 hr tablet Take by mouth daily with breakfast.     . metoprolol succinate (TOPROL XL) 25 MG 24 hr tablet Take 1 tablet (25 mg total) by mouth daily. 30 tablet 5  . nitroGLYCERIN (NITROSTAT) 0.4 MG SL tablet Place 0.4 mg under the tongue every 5 (five) minutes as needed for chest pain. Take every 5 minutes for max of 3 if no relief call 911    . potassium chloride SA (K-DUR,KLOR-CON) 20 MEQ tablet Take 20 mEq by mouth daily.     . rosuvastatin (CRESTOR) 40 MG tablet Take 1 tablet (40 mg total) by mouth daily. 30 tablet 5  . sertraline (ZOLOFT) 50 MG tablet Take 50 mg by mouth daily.    . ticagrelor (BRILINTA) 90 MG TABS tablet Take 1 tablet (90 mg total) by mouth every 12 (twelve) hours. 60 tablet 0   No current facility-administered medications  for this visit.    Allergies: Allergies  Allergen Reactions  . Dilaudid [Hydromorphone Hcl] Anaphylaxis, Itching and Swelling  . Hydromorphone Anaphylaxis, Itching and Swelling  . Epinephrine Anxiety and Other (See Comments)    Severe anxiety  . Lactose Intolerance (Gi) Diarrhea    Social History: The patient  reports that she has never smoked. She has never used smokeless tobacco. She reports that she does not drink alcohol or use illicit drugs.   Family History: The patient's family history includes Alzheimer's disease  in her father; COPD in her mother; Diabetes in her brother and mother; Heart attack in her father; Hypertension in her mother. There is no history of Stroke.   Review of Systems: Please see the history of present illness.   Otherwise, the review of systems is positive for none.   All other systems are reviewed and negative.   Physical Exam: VS:  BP 120/74 mmHg  Pulse 64  Ht 5' 2.5" (1.588 m)  Wt 177 lb 12.8 oz (80.65 kg)  BMI 31.98 kg/m2 .  BMI Body mass index is 31.98 kg/(m^2).  Wt Readings from Last 3 Encounters:  02/11/16 177 lb 12.8 oz (80.65 kg)  10/16/15 177 lb 12.8 oz (80.65 kg)  06/20/15 177 lb (80.287 kg)    General: Pleasant. Well developed, well nourished and in no acute distress. Very soft spoken.  HEENT: Normal.Trach scar noted.  Neck: Supple, no JVD, carotid bruits, or masses noted.  Cardiac: Regular rate and rhythm. Holosystolic murmur noted. No edema.  Respiratory:  Lungs are clear to auscultation bilaterally with normal work of breathing.  GI: Soft and nontender.  MS: No deformity or atrophy. Gait and ROM intact. Skin: Warm and dry. Color is normal.  Neuro:  Strength and sensation are intact and no gross focal deficits noted.  Psych: Alert, appropriate and with normal affect.   LABORATORY DATA:  EKG:  EKG is ordered today. This demonstrates NSR with inferior Q's and diffuse T wave changes - reviewed with Dr. Radford Pax.  Lab Results    Component Value Date   WBC 3.6* 10/16/2015   HGB 12.3 10/16/2015   HCT 36.1 10/16/2015   PLT 281 10/16/2015   GLUCOSE 83 10/16/2015   CHOL 189 10/16/2015   TRIG 91 10/16/2015   HDL 53 10/16/2015   LDLCALC 118 10/16/2015   ALT 24 10/16/2015   AST 22 10/16/2015   NA 136 10/16/2015   K 4.0 10/16/2015   CL 103 10/16/2015   CREATININE 0.65 10/16/2015   BUN 10 10/16/2015   CO2 28 10/16/2015   INR 1.20 06/06/2015   HGBA1C 6.3* 06/06/2015    BNP (last 3 results)  Recent Labs  06/05/15 2036  BNP 205.6*    ProBNP (last 3 results) No results for input(s): PROBNP in the last 8760 hours.   Other Studies Reviewed Today: Myoview Study Highlights from 05/2015     The left ventricular ejection fraction is mildly decreased (45-54%).  Nuclear stress EF: 46%. There is akinesis of the entire inferior wall and apical anterior wall.  There was no ST segment deviation noted during stress.  Defect 1: There is a small defect of severe severity present in the apical anterior and apex location.  Defect 2: There is a medium defect of severe severity present in the basal inferior, mid inferior and apical inferior location.  Findings consistent with prior myocardial infarction.  This is an intermediate risk study.  There are two infarcts in the RCA and distal LAD territories. No ischemia. Overall LVEF is mildly decreased.      Echo Study Conclusions from 05/2015  - Left ventricle: Inferior and mid and distal septal hypokinesis  The cavity size was mildly dilated. Wall thickness was normal.  The estimated ejection fraction was 40%. - Mitral valve: There was mild regurgitation. - Atrial septum: No defect or patent foramen ovale was identified. - Impressions: Coronary stent seen in left main artery.  Impressions:  - Coronary stent seen in left main artery.    Assessment/Plan: 1.  Ischemic heart disease status post STEMI on November 20/2015 treated at [facility] with 2  drug-eluting stents - to the left main and LCX due to thrombosis of the left main. Cardiac catheterization was complicated by cardiac arrest and resuscitation and laceration of liver. She required tracheostomy. Last Myoview from 05/2015 with two infarcts in the RCA and distal LAD territories - no ischemia and EF 45 to 54%. She has remained on DAPT with Brilinta and aspirin. She is felt to be at increased risk for her surgery but needs to proceed on. Would NOT stop her aspirin. Stay on beta blocker therapy. Will get her established with Dr. Oval Linsey going forward. Discussed with Dr. Radford Pax and she is in agreement.   2. Left ventricular systolic dysfunction with ejection fraction 46% by Myoview on 06/20/15 and about 40% by echo. There was evidence of old myocardial infarction scar but no ischemia. She is doing well clinically. No swelling. Breathing is stable. I have left her on her current regimen. She is on beta blocker and ARB therapy and diuretic.   3. Breast cancer -a stage IIB (T2 N1 M0) infiltrating ductal carcinoma, estrogen receptor positive, progesterone receptor negative, HER2 negative of the right breast. For surgery on 3/21 at [facility].   4. Adult-onset diabetes mellitus  5. DVT of left leg, resolved. Follow-up venous Dopplers were normal and warfarin has been stopped. She is still on dual anti-platelet therapy for coronary disease.  Current medicines are reviewed with the patient today.  The patient does not have concerns regarding medicines other than what has been noted above.  The following changes have been made:  See above.  Labs/ tests ordered today include:   No orders of the defined types were placed in this encounter.     Disposition:   FU with Dr.   Patient is agreeable to this plan and will call if any problems develop in the interim.   Signed: Burtis Junes, RN, ANP-C 02/11/2016 [facility] [address] [place], [place]  [address] Phone: [phone] Fax: [phone]

## 2016-02-19 ENCOUNTER — Ambulatory Visit: Payer: BC Managed Care – PPO | Admitting: Nurse Practitioner

## 2016-02-19 ENCOUNTER — Other Ambulatory Visit: Payer: BC Managed Care – PPO

## 2016-02-24 ENCOUNTER — Other Ambulatory Visit: Payer: Self-pay | Admitting: Cardiology

## 2016-03-10 ENCOUNTER — Telehealth: Payer: Self-pay | Admitting: Cardiovascular Disease

## 2016-03-10 NOTE — Telephone Encounter (Signed)
Received records from [facility] for appointment on 03/27/16 with Dr Oval Linsey.  Records given to [facility] (medical records) for Dr Blenda Mounts schedule on 03/27/16. lp

## 2016-03-27 ENCOUNTER — Ambulatory Visit (INDEPENDENT_AMBULATORY_CARE_PROVIDER_SITE_OTHER): Payer: BC Managed Care – PPO | Admitting: Cardiovascular Disease

## 2016-03-27 ENCOUNTER — Encounter: Payer: Self-pay | Admitting: Cardiovascular Disease

## 2016-03-27 VITALS — BP 110/68 | HR 84 | Ht 62.5 in | Wt 182.6 lb

## 2016-03-27 DIAGNOSIS — E785 Hyperlipidemia, unspecified: Secondary | ICD-10-CM

## 2016-03-27 DIAGNOSIS — R0602 Shortness of breath: Secondary | ICD-10-CM

## 2016-03-27 DIAGNOSIS — C50011 Malignant neoplasm of nipple and areola, right female breast: Secondary | ICD-10-CM

## 2016-03-27 DIAGNOSIS — I5042 Chronic combined systolic (congestive) and diastolic (congestive) heart failure: Secondary | ICD-10-CM

## 2016-03-27 DIAGNOSIS — E7800 Pure hypercholesterolemia, unspecified: Secondary | ICD-10-CM

## 2016-03-27 DIAGNOSIS — Z9861 Coronary angioplasty status: Secondary | ICD-10-CM

## 2016-03-27 DIAGNOSIS — I2510 Atherosclerotic heart disease of native coronary artery without angina pectoris: Secondary | ICD-10-CM

## 2016-03-27 LAB — COMPREHENSIVE METABOLIC PANEL
ALT: 16 U/L (ref 6–29)
AST: 19 U/L (ref 10–35)
Albumin: 4 g/dL (ref 3.6–5.1)
Alkaline Phosphatase: 83 U/L (ref 33–130)
BILIRUBIN TOTAL: 0.5 mg/dL (ref 0.2–1.2)
BUN: 13 mg/dL (ref 7–25)
CALCIUM: 9.7 mg/dL (ref 8.6–10.4)
CHLORIDE: 103 mmol/L (ref 98–110)
CO2: 29 mmol/L (ref 20–31)
Creat: 0.64 mg/dL (ref 0.50–1.05)
GLUCOSE: 86 mg/dL (ref 65–99)
Potassium: 5.2 mmol/L (ref 3.5–5.3)
SODIUM: 138 mmol/L (ref 135–146)
Total Protein: 8.1 g/dL (ref 6.1–8.1)

## 2016-03-27 LAB — LIPID PANEL
CHOL/HDL RATIO: 2.3 ratio (ref <=5.0)
Cholesterol: 142 mg/dL (ref 125–200)
HDL: 63 mg/dL (ref >=46)
LDL Cholesterol: 67 mg/dL (ref <130)
Triglycerides: 59 mg/dL (ref <150)
VLDL: 12 mg/dL (ref <30)

## 2016-03-27 NOTE — Progress Notes (Signed)
Cardiology Office Note   Date:  03/28/2016   ID:  Marie Tanner, Marie Tanner 05/24/1956, MRN [id number]  PCP:  Lynnell Jude, MD  Cardiologist:   Skeet Latch, MD  Oncologist: Samuel Bouche, MD Radiation Oncologist: Ladona Horns, MD Surgical Oncologist: Nancie Neas, MD  Chief Complaint  Patient presents with  . Follow-up    4-6 week per Remer Macho, NP  pt c/o chest pain and SOB--wakes up in the middle of the night gasping for breath, happened 3 times last week--none this week; no other Sx.    History of Present Illness: Marie Tanner is a 60 y.o. 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  Marie Tanner was previously a patient of [name].  In 2015 she had a STEMI and was cared for in the [place] cath lab where she had a cardiac arrest.  She ultimately received 2 DES (LM/LAD and LCx).  She had a prolonged hospitalization requiring tracheostomy.  Her liver was lacerated during CPR.  She required repair after femoral catheterization.  She also developed  A DVT in the hospital.  One week after discharge she developed pneumonia and was admitted at [facility].  She has been seen in the hospital twice for chest pain and ruled out for MI.  She had a negative Myoview 05/2015.  She also had negative CT scans for PE.  She last saw [name] in November.    Marie Tanner was recently diagnosed with breast cancer.  She was seen on 02/11/16 by Truitt Merle for preoperative clearance.  She underwent suegery at [facility] on 02/21/16.  Her oncologist or weighing their options for chemotherapy given her history of chronic systolic heart failure.  She was supposed to start this week but it was delayed due to a surgery site infection.  She follows up on 5/11 and will likely start at that time.   Over the last two weeks she had a couple episodes of awakening  gasping for breath.  She denies orthopnea or lower extremity edema.  She also notes a couple episodes of chest pain.  The episodes lasted for a few seconds.  They were substernal and radiated to her back.  The typically occur at rest up to 3 times per week.  They are associated with shortness of breath and diaphoresis.  She denies nausea.  This is similar to her chronic chest pain and was occuring at the time of her stress test last year.  Marie Tanner has not been exercising.  She gained 15 lb since having surgery.  She attributes this to lack of physical activity.  She notes pain in her calves and thighs and attributes this to crestor.  It comes and goes and lasts for a couple days at a time.  She has been on crestor since March.  It was worse on Lipitor.    Past Medical History  Diagnosis Date  . Renal disorder   . Coronary artery disease   . Liver laceration 10/19/14  . Pulmonary edema 10/19/14  . DVT (deep venous thrombosis) ([place]) 11/15    left femerol artery injury during resusciation.  [name] STEMI (ST elevation myocardial infarction) ([place]) 10/19/14  . Hypertension   . High cholesterol   . Heart murmur   . HCAP (healthcare-associated pneumonia) 11/22/2014  . Sleep apnea     "suppose to wear mask but I don't" (11/22/2014)  .  Type II diabetes mellitus ([place])   . History of blood transfusion 09/2014    related to MI  . GERD (gastroesophageal reflux disease)   . History of gout   . Depression     Past Surgical History  Procedure Laterality Date  . Cesarean section  1980; 1982  . Laparoscopic abdominal exploration    . Liver laceration repair  09/2014    related to CPR/notes 11/22/2014  . Tracheostomy  09/2014    "closed on it's own when they took it out"  . Femerol artery repair Right 09/2014    pt insists it was a right femoral artery repair on 11/22/2014  . Extracorporeal circulation    . Left ventricular assist device      pt is not aware of this hx on 11/22/2014  .  Abdominal hysterectomy  ~ 2000  . Tubal ligation  1982  . Coronary angioplasty with stent placement  09/2014    "multiple"/notes 11/22/2014  . Application of wound vac  10/2014    "over my naval" (11/22/2014)     Current Outpatient Prescriptions  Medication Sig Dispense Refill  . acetaminophen (TYLENOL) 500 MG tablet Take 1,000 mg by mouth 2 (two) times daily as needed (pain).     [name] aspirin EC 81 MG tablet Take 1 tablet (81 mg total) by mouth daily.    [name] BRILINTA 90 MG TABS tablet TAKE 1 TABLET (90 MG TOTAL) BY MOUTH EVERY 12 (TWELVE) HOURS. 60 tablet 11  . cephALEXin (KEFLEX) 500 MG capsule Take 500 mg by mouth 4 (four) times daily. For 10 days. Has 1 day left    . furosemide (LASIX) 40 MG tablet Take 1 tablet (40 mg total) by mouth daily at 12 noon. 30 tablet   . losartan (COZAAR) 25 MG tablet Take 25 mg by mouth daily.    . magnesium oxide (MAG-OX) 400 MG tablet Take 1,200 mg by mouth daily.     . metFORMIN (GLUCOPHAGE-XR) 500 MG 24 hr tablet Take by mouth daily with breakfast.     . metoprolol succinate (TOPROL XL) 25 MG 24 hr tablet Take 1 tablet (25 mg total) by mouth daily. 30 tablet 5  . nitroGLYCERIN (NITROSTAT) 0.4 MG SL tablet Place 0.4 mg under the tongue every 5 (five) minutes as needed for chest pain. Take every 5 minutes for max of 3 if no relief call 911    . potassium chloride SA (K-DUR,KLOR-CON) 20 MEQ tablet Take 20 mEq by mouth daily.     . rosuvastatin (CRESTOR) 40 MG tablet Take 1 tablet (40 mg total) by mouth daily. 30 tablet 5  . sertraline (ZOLOFT) 50 MG tablet Take 50 mg by mouth daily.     No current facility-administered medications for this visit.    Allergies:   Dilaudid; Hydromorphone; Epinephrine; and Lactose intolerance (gi)    Social History:  The patient  reports that she has never smoked. She has never used smokeless tobacco. She reports that she does not drink alcohol or use illicit drugs.   Family History:  The patient's family history includes  Alzheimer's disease in her father; COPD in her mother; Diabetes in her brother and mother; Heart attack in her father; Hypertension in her mother. There is no history of Stroke.    ROS:  Please see the history of present illness.   Otherwise, review of systems are positive for none.   All other systems are reviewed and negative.    PHYSICAL EXAM: VS:  BP  110/68 mmHg  Pulse 84  Ht 5' 2.5" (1.588 m)  Wt 82.827 kg (182 lb 9.6 oz)  BMI 32.85 kg/m2 , BMI Body mass index is 32.85 kg/(m^2). GENERAL:  Well appearing HEENT:  Pupils equal round and reactive, fundi not visualized, oral mucosa unremarkable NECK:  No jugular venous distention, waveform within normal limits, carotid upstroke brisk and symmetric, no bruits, no thyromegaly LYMPHATICS:  No cervical adenopathy LUNGS:  Clear to auscultation bilaterally HEART:  RRR.  PMI not displaced or sustained,S1 and S2 within normal limits, no S3, no S4, no clicks, no rubs, no murmurs ABD:  Flat, positive bowel sounds normal in frequency in pitch, no bruits, no rebound, no guarding, no midline pulsatile mass, no hepatomegaly, no splenomegaly EXT:  2 plus pulses throughout, no edema, no cyanosis no clubbing SKIN:  No rashes no nodules NEURO:  Cranial nerves II through XII grossly intact, motor grossly intact throughout PSYCH:  Cognitively intact, oriented to person place and time   EKG:  EKG is not ordered today. 02/11/16: Sinus arrhythmia.  Rate 71 bpm. Non-specific ST-T changes.   Recent Labs: 06/05/2015: B Natriuretic Peptide 205.6* 10/16/2015: ALT 24; BUN 10; Creat 0.65; Hemoglobin 12.3; Platelets 281; Potassium 4.0; Sodium 136    Lipid Panel    Component Value Date/Time   CHOL 189 10/16/2015 0941   TRIG 91 10/16/2015 0941   HDL 53 10/16/2015 0941   CHOLHDL 3.6 10/16/2015 0941   VLDL 18 10/16/2015 0941   LDLCALC 118 10/16/2015 0941      Wt Readings from Last 3 Encounters:  03/27/16 82.827 kg (182 lb 9.6 oz)  02/11/16 80.65 kg (177 lb  12.8 oz)  10/16/15 80.65 kg (177 lb 12.8 oz)      ASSESSMENT AND PLAN:  # Chronic systolic and diastolic heart failure:  LVEF 40% by echo, 46% by nuclear stress test.  Marie Tanner appears to be euvolemic.  However, she reports some episodes of PND.  We will check a BNP. Continue losartan, lasix and metoprolol.    # Hyperlipidemia:  Marie Tanner is currently on rosuvastatin but having muscle cramps.  Her last lipids in November were off statins.  We will repeat her lipid panel and CMP.  Refer to lipid clinic given her intolerance of multiple statins and history of MI.  # Breast cancer: Marie Tanner has already undergone surgery and will soon start chemotherapy.  Her oncology team is aware of her cardiac history.  We will monitor her echos closely while on chemo.  # CAD s/p STEMI: Continue aspirin and ticgrelor through at least 09/2016.  Consider reduced dose of ticagrelor at that time.  Continue metoprolol. She will follow up in lipid clinic as above.  Current medicines are reviewed at length with the patient today.  The patient does not have concerns regarding medicines.  The following changes have been made:  no change  Labs/ tests ordered today include:   Orders Placed This Encounter  Procedures  . Lipid panel  . Comprehensive Metabolic Panel (CMET)  . B Nat Peptide     Disposition:   FU with Marie Tanner C. Oval Linsey, MD, [facility]  In 3 months.    This note was written with the assistance of speech recognition software.  Please excuse any transcriptional errors.  Signed, Juana Montini C. Oval Linsey, MD, [facility]  03/28/2016 7:47 AM    [facility]

## 2016-03-27 NOTE — Patient Instructions (Addendum)
Medication Instructions:  Your physician recommends that you continue on your current medications as directed. Please refer to the Current Medication list given to you today.  Labwork: BNP/CMET/LP DOWNSTAIRS ON THE FIRST FLOOR  Testing/Procedures: NONE  Follow-Up: Your physician recommends that you schedule a follow-up appointment in: [place]   Your physician recommends that you schedule a follow-up appointment in: [place]  If you need a refill on your cardiac medications before your next appointment, please call your pharmacy.

## 2016-03-28 ENCOUNTER — Encounter: Payer: Self-pay | Admitting: Cardiovascular Disease

## 2016-03-28 LAB — BRAIN NATRIURETIC PEPTIDE: Brain Natriuretic Peptide: 101.2 pg/mL — ABNORMAL HIGH (ref <100)

## 2016-04-01 ENCOUNTER — Ambulatory Visit (INDEPENDENT_AMBULATORY_CARE_PROVIDER_SITE_OTHER): Payer: BC Managed Care – PPO | Admitting: Pharmacist

## 2016-04-01 DIAGNOSIS — I240 Acute coronary thrombosis not resulting in myocardial infarction: Secondary | ICD-10-CM | POA: Diagnosis not present

## 2016-04-01 DIAGNOSIS — I259 Chronic ischemic heart disease, unspecified: Secondary | ICD-10-CM

## 2016-04-01 MED ORDER — ROSUVASTATIN CALCIUM 20 MG PO TABS: ORAL_TABLET | ORAL | Status: DC

## 2016-04-01 MED ORDER — TICAGRELOR 90 MG PO TABS: 90.0000 mg | ORAL_TABLET | Freq: Two times a day (BID) | ORAL | Status: DC

## 2016-04-01 NOTE — Patient Instructions (Signed)
Please start taking Crestor 20mg  three times a week.   We will recheck labs and schedule a follow-up appointment for 2-3 months after change in dose.  Call [phone] if you experience muscle aches or have any questions.

## 2016-04-01 NOTE — Progress Notes (Signed)
Patient ID: Marie Tanner                 DOB: 04-Apr-1956                    MRN: [id number]     HPI: Marie Tanner is a 60 y.o. female patient referred to lipid clinic by Dr. Oval Linsey. She was recently seen by Dr. Oval Linsey and reported that she has been experiencing muscle aches in her thighs and lower back. At that appointment she had a lipid panel drawn which showed significant improvement since starting Crestor 40mg  daily. Her previous lipid levels were done when she was off statin. She states she has been taking the Crestor still since her visit with Dr. Oval Linsey. She says the muscle aches come and go but she experiences them most evenings despite not being as active as before.   Of note she will start chemo on Friday for breast cancer.   Current Medications:  Crestor 40mg  daily Intolerances: Lipitor 40 and 80mg  daily, Crestor 40mg  daily Risk Factors: hx of MI LDL goal: <70  Diet: States she has been eating poorly recently due to staying with her sister after cancer diagnosis.   Exercise: She says she has not been doing what she should due to resting with cancer.   Labs:  TC 142 TG 59  LDL 67 - on Crestor 40mg  daily  Past Medical History  Diagnosis Date  . Renal disorder   . Coronary artery disease   . Liver laceration 10/19/14  . Pulmonary edema 10/19/14  . DVT (deep venous thrombosis) ([place]) 11/15    left femerol artery injury during resusciation.  [name] STEMI (ST elevation myocardial infarction) ([place]) 10/19/14  . Hypertension   . High cholesterol   . Heart murmur   . HCAP (healthcare-associated pneumonia) 11/22/2014  . Sleep apnea     "suppose to wear mask but I don't" (11/22/2014)  . Type II diabetes mellitus ([place])   . History of blood transfusion 09/2014    related to MI  . GERD (gastroesophageal reflux disease)   . History of gout   . Depression     Current Outpatient Prescriptions on File Prior to Visit  Medication Sig Dispense Refill  .  acetaminophen (TYLENOL) 500 MG tablet Take 1,000 mg by mouth 2 (two) times daily as needed (pain).     [name] aspirin EC 81 MG tablet Take 1 tablet (81 mg total) by mouth daily.    [name] BRILINTA 90 MG TABS tablet TAKE 1 TABLET (90 MG TOTAL) BY MOUTH EVERY 12 (TWELVE) HOURS. 60 tablet 11  . furosemide (LASIX) 40 MG tablet Take 1 tablet (40 mg total) by mouth daily at 12 noon. 30 tablet   . losartan (COZAAR) 25 MG tablet Take 25 mg by mouth daily.    . magnesium oxide (MAG-OX) 400 MG tablet Take 1,200 mg by mouth daily.     . metFORMIN (GLUCOPHAGE-XR) 500 MG 24 hr tablet Take by mouth daily with breakfast.     . metoprolol succinate (TOPROL XL) 25 MG 24 hr tablet Take 1 tablet (25 mg total) by mouth daily. 30 tablet 5  . nitroGLYCERIN (NITROSTAT) 0.4 MG SL tablet Place 0.4 mg under the tongue every 5 (five) minutes as needed for chest pain. Take every 5 minutes for max of 3 if no relief call 911    . potassium chloride SA (K-DUR,KLOR-CON) 20 MEQ tablet Take 20 mEq by mouth daily.     [name]  rosuvastatin (CRESTOR) 40 MG tablet Take 1 tablet (40 mg total) by mouth daily. 30 tablet 5  . sertraline (ZOLOFT) 50 MG tablet Take 50 mg by mouth daily.     No current facility-administered medications on file prior to visit.    Allergies  Allergen Reactions  . Dilaudid [Hydromorphone Hcl] Anaphylaxis, Itching and Swelling  . Hydromorphone Anaphylaxis, Itching and Swelling  . Epinephrine Anxiety and Other (See Comments)    Severe anxiety  . Lactose Intolerance (Gi) Diarrhea    Assessment/Plan: Hyperlipidemia: Recent LDL was at goal <70; however, pt is having difficulty tolerating dose. Will have her stop Crestor 40mg  daily then try Crestor 20mg  three times a week. Patient is agreeable to this plan.  Instructed her to call if she still experiences muscle pains at the lower dose. Will do repeat lipid/hepatic panel in 2-3 months to see if LDL remains at goal on lower dose. I suspect her LDL will be higher; we can  consider Zetia at that time pending results.   She also requested a refill be sent over for Brilinta and I sent that to her pharmacy of choice.   Thank you, Lelan Pons. Patterson Hammersmith, [facility]  04/01/2016 1:41 PM

## 2016-04-01 NOTE — Assessment & Plan Note (Signed)
Recent LDL was at goal <70; however, pt is having difficulty tolerating dose. Will have her stop Crestor 40mg  daily then try Crestor 20mg  three times a week. Patient is agreeable to this plan.  Instructed her to call if she still experiences muscle pains at the lower dose. Will do repeat lipid/hepatic panel in 2-3 months to see if LDL remains at goal on lower dose. I suspect her LDL will be higher; we can consider Zetia at that time pending results.

## 2016-04-02 ENCOUNTER — Encounter: Payer: Self-pay | Admitting: Pharmacist

## 2016-04-10 ENCOUNTER — Emergency Department (HOSPITAL_COMMUNITY)
Admission: EM | Admit: 2016-04-10 | Discharge: 2016-04-10 | Disposition: A | Discharge status: Home / Self Care | Payer: BC Managed Care – PPO | Attending: Emergency Medicine | Admitting: Emergency Medicine

## 2016-04-10 ENCOUNTER — Emergency Department (HOSPITAL_COMMUNITY): Payer: BC Managed Care – PPO

## 2016-04-10 ENCOUNTER — Encounter (HOSPITAL_COMMUNITY): Payer: Self-pay | Admitting: Emergency Medicine

## 2016-04-10 DIAGNOSIS — Z79899 Other long term (current) drug therapy: Secondary | ICD-10-CM | POA: Insufficient documentation

## 2016-04-10 DIAGNOSIS — E7800 Pure hypercholesterolemia, unspecified: Secondary | ICD-10-CM | POA: Diagnosis not present

## 2016-04-10 DIAGNOSIS — C50919 Malignant neoplasm of unspecified site of unspecified female breast: Secondary | ICD-10-CM | POA: Insufficient documentation

## 2016-04-10 DIAGNOSIS — C799 Secondary malignant neoplasm of unspecified site: Secondary | ICD-10-CM | POA: Diagnosis not present

## 2016-04-10 DIAGNOSIS — Z7982 Long term (current) use of aspirin: Secondary | ICD-10-CM | POA: Insufficient documentation

## 2016-04-10 DIAGNOSIS — I2510 Atherosclerotic heart disease of native coronary artery without angina pectoris: Secondary | ICD-10-CM | POA: Diagnosis not present

## 2016-04-10 DIAGNOSIS — K429 Umbilical hernia without obstruction or gangrene: Secondary | ICD-10-CM | POA: Diagnosis not present

## 2016-04-10 DIAGNOSIS — M545 Low back pain: Secondary | ICD-10-CM | POA: Insufficient documentation

## 2016-04-10 DIAGNOSIS — E119 Type 2 diabetes mellitus without complications: Secondary | ICD-10-CM | POA: Diagnosis not present

## 2016-04-10 DIAGNOSIS — I252 Old myocardial infarction: Secondary | ICD-10-CM | POA: Diagnosis not present

## 2016-04-10 DIAGNOSIS — F329 Major depressive disorder, single episode, unspecified: Secondary | ICD-10-CM | POA: Diagnosis not present

## 2016-04-10 DIAGNOSIS — Z86718 Personal history of other venous thrombosis and embolism: Secondary | ICD-10-CM | POA: Insufficient documentation

## 2016-04-10 DIAGNOSIS — R011 Cardiac murmur, unspecified: Secondary | ICD-10-CM | POA: Insufficient documentation

## 2016-04-10 DIAGNOSIS — Z7984 Long term (current) use of oral hypoglycemic drugs: Secondary | ICD-10-CM | POA: Diagnosis not present

## 2016-04-10 DIAGNOSIS — I10 Essential (primary) hypertension: Secondary | ICD-10-CM | POA: Insufficient documentation

## 2016-04-10 HISTORY — DX: Malignant (primary) neoplasm, unspecified: C80.1

## 2016-04-10 LAB — COMPREHENSIVE METABOLIC PANEL
ALT: 12 U/L — ABNORMAL LOW (ref 14–54)
ANION GAP: 10 (ref 5–15)
AST: 19 U/L (ref 15–41)
Albumin: 3.6 g/dL (ref 3.5–5.0)
Alkaline Phosphatase: 76 U/L (ref 38–126)
BILIRUBIN TOTAL: 1 mg/dL (ref 0.3–1.2)
BUN: 5 mg/dL — AB (ref 6–20)
CO2: 23 mmol/L (ref 22–32)
Calcium: 9.4 mg/dL (ref 8.9–10.3)
Chloride: 102 mmol/L (ref 101–111)
Creatinine, Ser: 0.67 mg/dL (ref 0.44–1.00)
Glucose, Bld: 84 mg/dL (ref 65–99)
POTASSIUM: 4.3 mmol/L (ref 3.5–5.1)
Sodium: 135 mmol/L (ref 135–145)
TOTAL PROTEIN: 7.4 g/dL (ref 6.5–8.1)

## 2016-04-10 LAB — URINALYSIS, ROUTINE W REFLEX MICROSCOPIC
BILIRUBIN URINE: NEGATIVE
GLUCOSE, UA: NEGATIVE mg/dL
Hgb urine dipstick: NEGATIVE
KETONES UR: NEGATIVE mg/dL
Leukocytes, UA: NEGATIVE
NITRITE: NEGATIVE
PH: 7.5 (ref 5.0–8.0)
Protein, ur: NEGATIVE mg/dL
SPECIFIC GRAVITY, URINE: 1.013 (ref 1.005–1.030)

## 2016-04-10 LAB — CBC WITH DIFFERENTIAL/PLATELET
BASOS ABS: 0.1 10*3/uL (ref 0.0–0.1)
Basophils Relative: 1 %
Eosinophils Absolute: 0.1 10*3/uL (ref 0.0–0.7)
Eosinophils Relative: 1 %
HEMATOCRIT: 34.9 % — AB (ref 36.0–46.0)
Hemoglobin: 11.3 g/dL — ABNORMAL LOW (ref 12.0–15.0)
LYMPHS ABS: 1.3 10*3/uL (ref 0.7–4.0)
Lymphocytes Relative: 15 %
MCH: 26.5 pg (ref 26.0–34.0)
MCHC: 32.4 g/dL (ref 30.0–36.0)
MCV: 81.7 fL (ref 78.0–100.0)
MONOS PCT: 24 %
Monocytes Absolute: 2.1 10*3/uL — ABNORMAL HIGH (ref 0.1–1.0)
NEUTROS PCT: 59 %
Neutro Abs: 5 10*3/uL (ref 1.7–7.7)
PLATELETS: 215 10*3/uL (ref 150–400)
RBC: 4.27 MIL/uL (ref 3.87–5.11)
RDW: 13.6 % (ref 11.5–15.5)
WBC: 8.6 10*3/uL (ref 4.0–10.5)

## 2016-04-10 LAB — PATHOLOGIST SMEAR REVIEW

## 2016-04-10 MED ORDER — MORPHINE SULFATE (PF) 4 MG/ML IV SOLN
4.0000 mg | Freq: Once | INTRAVENOUS | Status: AC
  Administered 2016-04-10: 4 mg via INTRAVENOUS
  Filled 2016-04-10: qty 1

## 2016-04-10 MED ORDER — IOPAMIDOL (ISOVUE-300) INJECTION 61%
INTRAVENOUS | Status: AC
Start: 2016-04-10 — End: 2016-04-10
  Administered 2016-04-10: 100 mL
  Filled 2016-04-10: qty 100

## 2016-04-10 MED ORDER — OXYCODONE-ACETAMINOPHEN 5-325 MG PO TABS: 1.0000 | ORAL_TABLET | Freq: Four times a day (QID) | ORAL | Status: DC | PRN

## 2016-04-10 MED ORDER — SODIUM CHLORIDE 0.9 % IV BOLUS (SEPSIS)
500.0000 mL | Freq: Once | INTRAVENOUS | Status: AC
  Administered 2016-04-10: 500 mL via INTRAVENOUS

## 2016-04-10 NOTE — ED Notes (Signed)
Recently started chemo on Friday for [facility]. Has port.  Lower back pain started yesterday radiating around side. Mastectomy in March.

## 2016-04-10 NOTE — Discharge Instructions (Signed)

## 2016-04-10 NOTE — ED Notes (Signed)
NAD at this time. Pt alert x4. Pt had all belongings at d/c.

## 2016-04-10 NOTE — ED Notes (Signed)
MD at the bedside  

## 2016-04-10 NOTE — ED Provider Notes (Signed)
CSN: [id number]     Arrival date & time 04/10/16  [id number] History   First MD Initiated Contact with Patient 04/10/16 1013     Chief Complaint  Patient presents with  . Back Pain      Patient is a 60 y.o. female presenting with back pain. The history is provided by the patient.  Back Pain Associated symptoms: abdominal pain   Associated symptoms: no chest pain, no headaches, no numbness and no weakness   Patient resents with lower back pain. Began yesterday. Around 1 week ago started chemotherapy for breast cancer. This first time she's had a. Also had Neupogen. States she had temperature up to 100.3 yesterday. Has had some chills. No dysuria. States she has had pain with bowel movements. She has had diarrhea now also. Nausea without vomiting.  Past Medical History  Diagnosis Date  . Renal disorder   . Coronary artery disease   . Liver laceration 10/19/14  . Pulmonary edema 10/19/14  . DVT (deep venous thrombosis) ([place]) 11/15    left femerol artery injury during resusciation.  [name] STEMI (ST elevation myocardial infarction) ([place]) 10/19/14  . Hypertension   . High cholesterol   . Heart murmur   . HCAP (healthcare-associated pneumonia) 11/22/2014  . Sleep apnea     "suppose to wear mask but I don't" (11/22/2014)  . Type II diabetes mellitus ([place])   . History of blood transfusion 09/2014    related to MI  . GERD (gastroesophageal reflux disease)   . History of gout   . Depression   . Cancer [facility])    Past Surgical History  Procedure Laterality Date  . Cesarean section  1980; 1982  . Laparoscopic abdominal exploration    . Liver laceration repair  09/2014    related to CPR/notes 11/22/2014  . Tracheostomy  09/2014    "closed on it's own when they took it out"  . Femerol artery repair Right 09/2014    pt insists it was a right femoral artery repair on 11/22/2014  . Extracorporeal circulation    . Left ventricular assist device      pt is not aware of this hx on 11/22/2014  .  Abdominal hysterectomy  ~ 2000  . Tubal ligation  1982  . Coronary angioplasty with stent placement  09/2014    "multiple"/notes 11/22/2014  . Application of wound vac  10/2014    "over my naval" (11/22/2014)   Family History  Problem Relation Age of Onset  . Diabetes Mother   . Hypertension Mother   . COPD Mother   . Heart attack Father   . Alzheimer's disease Father   . Diabetes Brother   . Stroke Neg Hx    Social History  Substance Use Topics  . Smoking status: Never Smoker   . Smokeless tobacco: Never Used  . Alcohol Use: No   OB History    Gravida Para Term Preterm AB TAB SAB Ectopic Multiple Living   2 2             Review of Systems  Constitutional: Positive for chills and fatigue. Negative for activity change and appetite change.  Eyes: Negative for pain.  Respiratory: Negative for chest tightness and shortness of breath.   Cardiovascular: Negative for chest pain and leg swelling.  Gastrointestinal: Positive for abdominal pain and diarrhea. Negative for nausea and vomiting.  Genitourinary: Negative for flank pain.  Musculoskeletal: Positive for back pain. Negative for neck stiffness.  Skin: Negative for rash.  Neurological: Negative for weakness, numbness and headaches.  Psychiatric/Behavioral: Negative for behavioral problems.      Allergies  Dilaudid; Hydromorphone; Epinephrine; and Lactose intolerance (gi)  Home Medications   Prior to Admission medications   Medication Sig Start Date End Date Taking? Authorizing Provider  acetaminophen (TYLENOL) 500 MG tablet Take 1,000 mg by mouth 2 (two) times daily as needed (pain).    Yes Historical Provider, MD  aspirin EC 81 MG tablet Take 1 tablet (81 mg total) by mouth daily. 04/08/15  Yes Darlin Coco, MD  dexamethasone (DECADRON) 4 MG tablet Take 4 mg by mouth See admin instructions. Pt takes 2 tabs by mouth twice daily on the day before and the the day after each chemo 03/20/16  Yes Historical Provider, MD   furosemide (LASIX) 40 MG tablet Take 1 tablet (40 mg total) by mouth daily at 12 noon. 06/10/15  Yes Charlynne Cousins, MD  losartan (COZAAR) 25 MG tablet Take 25 mg by mouth daily.   Yes Historical Provider, MD  magnesium oxide (MAG-OX) 400 MG tablet Take 1,200 mg by mouth daily.    Yes Historical Provider, MD  metFORMIN (GLUCOPHAGE-XR) 500 MG 24 hr tablet Take by mouth daily with breakfast.  01/26/16  Yes Historical Provider, MD  metoprolol succinate (TOPROL XL) 25 MG 24 hr tablet Take 1 tablet (25 mg total) by mouth daily. 10/16/15  Yes Darlin Coco, MD  nitroGLYCERIN (NITROSTAT) 0.4 MG SL tablet Place 0.4 mg under the tongue every 5 (five) minutes as needed for chest pain. Take every 5 minutes for max of 3 if no relief call 911   Yes Historical Provider, MD  ondansetron (ZOFRAN) 8 MG tablet Take 8 mg by mouth every 12 (twelve) hours as needed. nausea 03/20/16 03/20/17 Yes Historical Provider, MD  potassium chloride SA (K-DUR,KLOR-CON) 20 MEQ tablet Take 20 mEq by mouth daily.    Yes Historical Provider, MD  prochlorperazine (COMPAZINE) 10 MG tablet Take 10 mg by mouth every 6 (six) hours as needed. nausea 04/03/16 04/10/16 Yes Historical Provider, MD  rosuvastatin (CRESTOR) 20 MG tablet Take 1 tablet three times a week 04/01/16  Yes Skeet Latch, MD  sertraline (ZOLOFT) 50 MG tablet Take 50 mg by mouth daily.   Yes Historical Provider, MD  ticagrelor (BRILINTA) 90 MG TABS tablet Take 1 tablet (90 mg total) by mouth 2 (two) times daily. 04/01/16  Yes Skeet Latch, MD   BP 105/61 mmHg  Pulse 86  Temp(Src) 99.3 F (37.4 C) (Oral)  Resp 18  SpO2 98% Physical Exam  Constitutional: She is oriented to person, place, and time. She appears well-developed and well-nourished.  HENT:  Head: Normocephalic and atraumatic.  Eyes: Pupils are equal, round, and reactive to light.  Neck: Normal range of motion.  Cardiovascular: Normal rate, regular rhythm and normal heart sounds.   No murmur  heard. Pulmonary/Chest: Effort normal and breath sounds normal. No respiratory distress. She has no wheezes.  Abdominal: Soft. Bowel sounds are normal. She exhibits no distension. There is no tenderness.  Possible periumbilical hernia.  Musculoskeletal: Normal range of motion.  Bilateral lower lumbar area tenderness.    Neurological: She is alert and oriented to person, place, and time. No cranial nerve deficit.  Skin: Skin is warm and dry.  Psychiatric: She has a normal mood and affect. Her speech is normal.  Nursing note and vitals reviewed.   ED Course  Procedures (including critical care time) Labs Review Labs Reviewed  COMPREHENSIVE METABOLIC PANEL - Abnormal;  Notable for the following:    BUN 5 (*)    ALT 12 (*)    All other components within normal limits  CBC WITH DIFFERENTIAL/PLATELET - Abnormal; Notable for the following:    Hemoglobin 11.3 (*)    HCT 34.9 (*)    Monocytes Absolute 2.1 (*)    All other components within normal limits  URINALYSIS, ROUTINE W REFLEX MICROSCOPIC (NOT AT [facility])  PATHOLOGIST SMEAR REVIEW    Imaging Review Ct Abdomen Pelvis W Contrast  04/10/2016  CLINICAL DATA:  Abdominal pain and fever beginning last night. Ongoing chemotherapy for breast cancer. EXAM: CT ABDOMEN AND PELVIS WITH CONTRAST TECHNIQUE: Multidetector CT imaging of the abdomen and pelvis was performed using the standard protocol following bolus administration of intravenous contrast. CONTRAST:  164mL ISOVUE-300 IOPAMIDOL (ISOVUE-300) INJECTION 61% COMPARISON:  CT of the abdomen and pelvis 06/06/2015. Acute abdominal series from the same day. FINDINGS: Lower chest: Mild dependent atelectasis is present at the lung bases bilaterally. No focal nodule or mass lesion is present. Skin thickening is present over the right breast. A central density may correlate with patient's known breast cancer. No significant adenopathy is evident. The a heart size is normal. No significant pleural or  pericardial effusion is present. Hepatobiliary: The liver is unremarkable. No focal lesions are present. The common bile duct and gallbladder are normal. Pancreas: Within normal limits. Spleen: Unremarkable. Adrenals/Urinary Tract: Adrenal glands are within normal limits bilaterally. The kidneys and ureters are within normal limits. The urinary bladder is within normal limits. Stomach/Bowel: A small hiatal hernia is present. Stomach is within normal limits. The duodenum is unremarkable. The remainder the small bowel is within normal limits. The appendix is at the upper limits of normal for size measuring 7 mm. There is no inflammation. Gas is present into the distal appendix. The ascending colon is normal. The transverse colon traverses past a small ventral paraumbilical hernia which contains fat but no bowel. There is no associated obstruction. The bowel distal to this area is however somewhat collapsed. Descending and sigmoid colon is near completely collapsed. Vascular/Lymphatic: Atherosclerotic calcifications are present in the aorta and branch vessels without aneurysm. Reproductive: Hysterectomy is noted. The adnexa are within normal limits. Other: No significant free fluid is present. Musculoskeletal: A vacuum disc is present at T11-12. Vertebral body heights and alignment are normal. IMPRESSION: 1. Transverse colon extends to the age of a small paraumbilical hernia. 2. The more distal colon is collapsed. There is no definite obstruction. 3. Borderline enlargement of the appendix without inflammation to suggest appendicitis. 4. Diffuse atherosclerotic disease of the aorta without aneurysm. 5. Right breast skin thickening and masslike lesion likely represents the patient's breast cancer. Electronically Signed   By: San Morelle M.D.   On: 04/10/2016 14:17   Dg Abd Acute W/chest  04/10/2016  CLINICAL DATA:  Nausea for 2 days EXAM: DG ABDOMEN ACUTE W/ 1V CHEST COMPARISON:  None. FINDINGS: Cardiac  shadow is within normal limits. A left chest wall port is seen in satisfactory position. The lungs are clear bilaterally. Scattered large and small bowel gas is noted. No free air is seen. No abnormal mass or abnormal calcifications are noted. No acute bony abnormality is seen. Degenerative change of the lumbar spine is noted. IMPRESSION: No acute abnormality noted. Electronically Signed   By: Inez Catalina M.D.   On: 04/10/2016 11:13   I have personally reviewed and evaluated these images and lab results as part of my medical decision-making.   EKG  Interpretation None      MDM   Final diagnoses:  Low back pain without sciatica, unspecified back pain laterality  Umbilical hernia, recurrence not specified  Metastatic cancer [facility])    Patient with back pain. Possible reducible hernia. Does not appear to be instructed this time. Recently chemotherapy. Feels better after treatment. CT scan overall reassuring. Could have had a herniation of the transverse colon but does not appear obstructed or herniated at this time. Will discharge home.    Davonna Belling, MD 04/10/16 1504

## 2016-04-21 ENCOUNTER — Other Ambulatory Visit: Payer: Self-pay

## 2016-04-22 MED ORDER — METOPROLOL SUCCINATE ER 25 MG PO TB24: 25.0000 mg | ORAL_TABLET | Freq: Every day | ORAL | Status: DC

## 2016-05-25 ENCOUNTER — Encounter: Payer: Self-pay | Admitting: Sports Medicine

## 2016-05-25 ENCOUNTER — Ambulatory Visit (INDEPENDENT_AMBULATORY_CARE_PROVIDER_SITE_OTHER): Payer: BC Managed Care – PPO | Admitting: Sports Medicine

## 2016-05-25 DIAGNOSIS — E1141 Type 2 diabetes mellitus with diabetic mononeuropathy: Secondary | ICD-10-CM

## 2016-05-25 DIAGNOSIS — B353 Tinea pedis: Secondary | ICD-10-CM | POA: Diagnosis not present

## 2016-05-25 DIAGNOSIS — M79672 Pain in left foot: Secondary | ICD-10-CM

## 2016-05-25 DIAGNOSIS — M79671 Pain in right foot: Secondary | ICD-10-CM | POA: Diagnosis not present

## 2016-05-25 DIAGNOSIS — B351 Tinea unguium: Secondary | ICD-10-CM

## 2016-05-25 MED ORDER — TERBINAFINE HCL 1 % EX CREA: 1.0000 "application " | TOPICAL_CREAM | Freq: Two times a day (BID) | CUTANEOUS | Status: DC

## 2016-05-25 MED ORDER — CLOTRIMAZOLE 1 % EX SOLN: 1.0000 "application " | Freq: Two times a day (BID) | CUTANEOUS | Status: DC

## 2016-05-25 NOTE — Progress Notes (Signed)
Patient ID: Marie Tanner, female   DOB: 1956-03-17, 60 y.o.   MRN: [id number] Subjective: Marie Tanner is a 60 y.o. female patient with history of diabetes who presents to office today complaining of long, painful nails  while ambulating in shoes; unable to trim and dry skin to both feet was treated for ezcema by PCP with no improvement. Patient states that the glucose reading this morning was "good" runs in 120s. Patient denies any new changes in medication or new problems. Patient denies any new cramping, numbness, burning or tingling in the legs. Admits to ocassional tingling and burning in feet.   Patient Active Problem List   Diagnosis Date Noted  . Malignant neoplasm of female breast ([place]) 01/24/2016  . Abnormal finding on mammography 01/17/2016  . Chest pain 06/06/2015  . Diarrhea 06/06/2015  . Essential hypertension 06/06/2015  . Depression   . Encounter for therapeutic drug monitoring 02/19/2015  . Mitral regurgitation 01/30/2015  . 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[place]) 01/30/2015  . Left ventricular systolic dysfunction 59/56/3875  . Dyspnea 11/24/2014  . Cardiomyopathy ([place]) 11/24/2014  . DVT (deep venous thrombosis) ([place]) 11/24/2014  . DM type 2 (diabetes mellitus, type 2) ([place]) 11/24/2014  . 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 Controlled type 2 diabetes mellitus without complication ([place]) 64/33/2951  . High sodium levels 10/29/2014  . Elevated WBC count 10/29/2014  . Acute confusion due to medical condition 10/27/2014  . Insomnia due to medical condition 10/27/2014  . Acute respiratory failure ([place]) 10/24/2014  . 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[place]) 10/24/2014  . Common femoral artery injury 10/21/2014  . Injury of kidney 10/20/2014  . Cardiogenic shock ([place]) 10/20/2014  . Laceration of liver 10/20/2014  . ST elevation myocardial infarction (STEMI) ([place]) 10/20/2014  . Diabetes mellitus ([place]) 04/19/2013    Current Outpatient Prescriptions on File Prior to Visit  Medication Sig Dispense Refill  . acetaminophen (TYLENOL) 500 MG tablet Take 1,000 mg by mouth 2 (two) times daily as needed (pain).     [name] aspirin EC 81 MG tablet Take 1 tablet (81 mg total) by mouth daily.    [name] dexamethasone (DECADRON) 4 MG tablet Take 4 mg by mouth See admin instructions. Pt takes 2 tabs by mouth twice daily on the day before and the the day after each chemo    . furosemide (LASIX) 40 MG tablet Take 1 tablet (40 mg total) by mouth daily at 12 noon. 30 tablet   . losartan (COZAAR) 25 MG tablet Take 25 mg by mouth daily.    . magnesium oxide (MAG-OX) 400 MG tablet Take 1,200 mg by mouth daily.     . metFORMIN (GLUCOPHAGE-XR) 500 MG 24 hr tablet Take by mouth daily with breakfast.     . metoprolol succinate (TOPROL XL) 25 MG 24 hr tablet Take 1 tablet (25 mg total) by mouth daily. 30 tablet 8  . nitroGLYCERIN (NITROSTAT) 0.4 MG SL tablet Place 0.4 mg under the tongue every 5 (five) minutes as needed for chest pain. Take every 5 minutes for max of 3 if no relief call 911    . ondansetron (ZOFRAN) 8 MG tablet Take 8 mg by mouth every 12 (twelve) hours as needed. nausea    . oxyCODONE-acetaminophen (PERCOCET/ROXICET) 5-325 MG tablet Take 1-2 tablets by mouth every 6 (six) hours as needed for severe pain. 10 tablet 0  . potassium chloride SA (K-DUR,KLOR-CON) 20 MEQ tablet Take 20 mEq by mouth daily.     [name]  prochlorperazine (COMPAZINE) 10 MG tablet Take 10 mg by mouth every 6 (six) hours as needed. nausea    . rosuvastatin (CRESTOR) 20 MG tablet Take 1 tablet three times a week 30 tablet 2  . sertraline (ZOLOFT) 50 MG tablet Take 50 mg by mouth daily.    . ticagrelor (BRILINTA) 90 MG TABS tablet Take 1 tablet (90 mg total) by mouth 2 (two) times daily. 60 tablet 11   No current facility-administered medications on file prior to visit.   Allergies  Allergen Reactions  . Dilaudid [Hydromorphone Hcl] Anaphylaxis, Itching and  Swelling  . Hydromorphone Anaphylaxis, Itching and Swelling  . Epinephrine Anxiety and Other (See Comments)    Severe anxiety  . Lactose Intolerance (Gi) Diarrhea    Recent Results (from the past 2160 hour(s))  Lipid panel     Status: None   Collection Time: 03/27/16 10:42 AM  Result Value Ref Range   Cholesterol 142 125 - 200 mg/dL   Triglycerides 59 <150 mg/dL   HDL 63 >=46 mg/dL   Total CHOL/HDL Ratio 2.3 <=5.0 Ratio   VLDL 12 <30 mg/dL   LDL Cholesterol 67 <130 mg/dL    Comment:   Total Cholesterol/HDL Ratio:CHD Risk                        Coronary Heart Disease Risk Table                                        Men       Women          1/2 Average Risk              3.4        3.3              Average Risk              5.0        4.4           2X Average Risk              9.6        7.1           3X Average Risk             23.4       11.0 Use the calculated Patient Ratio above and the CHD Risk table  to determine the patient's CHD Risk.   Comprehensive Metabolic Panel (CMET)     Status: None   Collection Time: 03/27/16 10:42 AM  Result Value Ref Range   Sodium 138 135 - 146 mmol/L   Potassium 5.2 3.5 - 5.3 mmol/L   Chloride 103 98 - 110 mmol/L   CO2 29 20 - 31 mmol/L   Glucose, Bld 86 65 - 99 mg/dL   BUN 13 7 - 25 mg/dL   Creat 0.64 0.50 - 1.05 mg/dL   Total Bilirubin 0.5 0.2 - 1.2 mg/dL   Alkaline Phosphatase 83 33 - 130 U/L   AST 19 10 - 35 U/L   ALT 16 6 - 29 U/L   Total Protein 8.1 6.1 - 8.1 g/dL   Albumin 4.0 3.6 - 5.1 g/dL   Calcium 9.7 8.6 - 10.4 mg/dL  B Nat Peptide     Status: Abnormal   Collection Time: 03/27/16 10:42 AM  Result  Value Ref Range   Brain Natriuretic Peptide 101.2 (H) <100 pg/mL    Comment:   BNP levels increase with age in the general population with the highest values seen in individuals greater than 84 years of age. Reference: Joellyn Rued Cardiol 2002; 37:169-67.     Comprehensive metabolic panel     Status: Abnormal   Collection  Time: 04/10/16 10:44 AM  Result Value Ref Range   Sodium 135 135 - 145 mmol/L   Potassium 4.3 3.5 - 5.1 mmol/L   Chloride 102 101 - 111 mmol/L   CO2 23 22 - 32 mmol/L   Glucose, Bld 84 65 - 99 mg/dL   BUN 5 (L) 6 - 20 mg/dL   Creatinine, Ser 0.67 0.44 - 1.00 mg/dL   Calcium 9.4 8.9 - 10.3 mg/dL   Total Protein 7.4 6.5 - 8.1 g/dL   Albumin 3.6 3.5 - 5.0 g/dL   AST 19 15 - 41 U/L   ALT 12 (L) 14 - 54 U/L   Alkaline Phosphatase 76 38 - 126 U/L   Total Bilirubin 1.0 0.3 - 1.2 mg/dL   GFR calc non Af Amer >60 >60 mL/min   GFR calc Af Amer >60 >60 mL/min    Comment: (NOTE) The eGFR has been calculated using the CKD EPI equation. This calculation has not been validated in all clinical situations. eGFR's persistently <60 mL/min signify possible Chronic Kidney Disease.    Anion gap 10 5 - 15  CBC with Differential     Status: Abnormal   Collection Time: 04/10/16 10:44 AM  Result Value Ref Range   WBC 8.6 4.0 - 10.5 K/uL   RBC 4.27 3.87 - 5.11 MIL/uL   Hemoglobin 11.3 (L) 12.0 - 15.0 g/dL   HCT 34.9 (L) 36.0 - 46.0 %   MCV 81.7 78.0 - 100.0 fL   MCH 26.5 26.0 - 34.0 pg   MCHC 32.4 30.0 - 36.0 g/dL   RDW 13.6 11.5 - 15.5 %   Platelets 215 150 - 400 K/uL   Neutrophils Relative % 59 %   Lymphocytes Relative 15 %   Monocytes Relative 24 %   Eosinophils Relative 1 %   Basophils Relative 1 %   Neutro Abs 5.0 1.7 - 7.7 K/uL   Lymphs Abs 1.3 0.7 - 4.0 K/uL   Monocytes Absolute 2.1 (H) 0.1 - 1.0 K/uL   Eosinophils Absolute 0.1 0.0 - 0.7 K/uL   Basophils Absolute 0.1 0.0 - 0.1 K/uL   WBC Morphology MILD LEFT SHIFT (1-5% METAS, OCC MYELO, OCC BANDS)     Comment: INCREASED BANDS (>20% BANDS) DOHLE BODIES   Pathologist smear review     Status: None   Collection Time: 04/10/16 10:44 AM  Result Value Ref Range   Path Review Left shift. Recommend further hematologic     Comment: workup if clinically indicated.  Reviewed by Lennox Solders Lyndon Code, M.D. 04/10/2016   Urinalysis, Routine w  reflex microscopic     Status: None   Collection Time: 04/10/16 11:34 AM  Result Value Ref Range   Color, Urine YELLOW YELLOW   APPearance CLEAR CLEAR   Specific Gravity, Urine 1.013 1.005 - 1.030   pH 7.5 5.0 - 8.0   Glucose, UA NEGATIVE NEGATIVE mg/dL   Hgb urine dipstick NEGATIVE NEGATIVE   Bilirubin Urine NEGATIVE NEGATIVE   Ketones, ur NEGATIVE NEGATIVE mg/dL   Protein, ur NEGATIVE NEGATIVE mg/dL   Nitrite NEGATIVE NEGATIVE   Leukocytes, UA NEGATIVE NEGATIVE  Comment: MICROSCOPIC NOT DONE ON URINES WITH NEGATIVE PROTEIN, BLOOD, LEUKOCYTES, NITRITE, OR GLUCOSE <1000 mg/dL.    Objective: General: Patient is awake, alert, and oriented x 3 and in no acute distress.  Integument: Skin is warm, dry and supple bilateral. Nails are tender, long, thickened and  dystrophic with subungual debris, consistent with onychomycosis, 1-5 bilateral. + scaly skin in mocasson distrubution suggestive on tinea that is also interdigital. No open lesions or preulcerative lesions present bilateral. Remaining integument unremarkable.  Vasculature:  Dorsalis Pedis pulse 2/4 bilateral. Posterior Tibial pulse  1/4 bilateral.  Capillary fill time <3 sec 1-5 bilateral. Positive hair growth to the level of the digits. Temperature gradient within normal limits. No varicosities present bilateral. No edema present bilateral.   Neurology: The patient has intact sensation measured with a 5.07/10g Semmes Weinstein Monofilament at all pedal sites bilateral . Vibratory sensation diminished bilateral with tuning fork. No Babinski sign present bilateral. Subjective tingling.   Musculoskeletal: No symptomatic pedal deformities noted bilateral. Muscular strength 5/5 in all lower extremity muscular groups bilateral without pain on range of motion . No tenderness with calf compression bilateral.  Assessment and Plan: Problem List Items Addressed This Visit    None    Visit Diagnoses    Tinea pedis of both feet    -   Primary    Relevant Medications    clotrimazole (LOTRIMIN) 1 % external solution    terbinafine (LAMISIL AT ATHLETES FOOT) 1 % cream    Dermatophytosis, nail        Relevant Medications    clotrimazole (LOTRIMIN) 1 % external solution    terbinafine (LAMISIL AT ATHLETES FOOT) 1 % cream    Foot pain, bilateral        Diabetic mononeuropathy associated with type 2 diabetes mellitus ([place])           -Examined patient. -Discussed and educated patient on diabetic foot care, especially with  regards to the vascular, neurological and musculoskeletal systems.  -Stressed the importance of good glycemic control and the detriment of not  controlling glucose levels in relation to the foot. -Mechanically debrided all nails 1-5 bilateral using sterile nail nipper and filed with dremel without incident  -Rx lamisil cream and clotrimazole solution  -Answered all patient questions -Patient to return  in 3 months for at risk foot care -Patient advised to call the office if any problems or questions arise in the meantime.  Landis Martins, DPM

## 2016-06-03 ENCOUNTER — Telehealth: Payer: Self-pay | Admitting: Pharmacist

## 2016-06-03 NOTE — Telephone Encounter (Signed)
Called pt - need to get lipid panel and hepatic panel after decrease dose of Crestor. Also schedule f/u with lipid clinic if necessary to consider Zetia.

## 2016-06-08 NOTE — Telephone Encounter (Signed)
Second attempt at scheduling labs.

## 2016-06-12 ENCOUNTER — Telehealth: Payer: Self-pay | Admitting: Cardiovascular Disease

## 2016-06-12 NOTE — Telephone Encounter (Signed)
Received records from [facility] for appointment on 06/16/16 with Dr Oval Linsey.  Records given to [facility] (medical records) for Dr Blenda Mounts schedule on 06/16/16. lp

## 2016-06-15 NOTE — Progress Notes (Signed)
Cardiology Office Note   Date:  06/16/2016   ID:  Marie Tanner, DOB [birth year], MRN [id number]  PCP:  Lynnell Jude, MD  Cardiologist:   Skeet Latch, MD  Oncologist: Samuel Bouche, MD Radiation Oncologist: Ladona Horns, MD Surgical Oncologist: Nancie Neas, MD  Chief Complaint  Patient presents with  . Follow-up    pt states has some SOB that sometimes happens out the blue     History of Present Illness: Marie Tanner is a 60 y.o. 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  Marie Tanner was previously a patient of [name].  In 2015 she had a STEMI and was cared for in the [facility] cath lab where she had a cardiac arrest.  She ultimately received 2 DES (LM/LAD and LCx).  She had a prolonged hospitalization requiring tracheostomy.  Her liver was lacerated during CPR.  She required repair after femoral catheterization.  She also developed  A DVT in the hospital.  One week after discharge she developed pneumonia and was admitted at [facility].  She has been seen in the hospital twice for chest pain and ruled out for MI.  She had a negative Myoview 05/2015.  She also had negative CT scans for PE.  Marie Tanner also underwent surgery for breast cancer on 02/21/16.   At her last appointment 02/2016 Marie Tanner reported orthopnea and chest pain.  BNP was 101.  Repeat ischemia evaluation was deferred.  She had myalgias on Crestor (LDL 67), so she was referred to lipid clinic where it was reduced from 40 to 20 mg daily.  Since that appointment she has completed chemotherapy with Docetaxel and Cyclophosphamide.  She is scheduled to meet with a radiologist to begin radiation this Friday.  She has done fairly well but reports fatigue and increased shortness of breath even at rest.  She endorses orthopnea but denies PND or lower extremity edema.  Her  weight has been stable.  She is also noted that her resting heart rate has been higher. This typically ranges from 105-113. She has not noted any chest pain or pressure. She has noted nausea and fatigue that she attributes to chemotherapy. It typically improves after approximately one week.     Past Medical History  Diagnosis Date  . Renal disorder   . Coronary artery disease   . Liver laceration 10/19/14  . Pulmonary edema 10/19/14  . DVT (deep venous thrombosis) ([place]) 11/15    left femerol artery injury during resusciation.  [name] STEMI (ST elevation myocardial infarction) ([place]) 10/19/14  . Hypertension   . High cholesterol   . Heart murmur   . HCAP (healthcare-associated pneumonia) 11/22/2014  . Sleep apnea     "suppose to wear mask but I don't" (11/22/2014)  . Type II diabetes mellitus ([place])   . History of blood transfusion 09/2014    related to MI  . GERD (gastroesophageal reflux disease)   . History of gout   . Depression   . Cancer [facility])     Past Surgical History  Procedure Laterality Date  . Cesarean section  1980; 1982  . Laparoscopic abdominal exploration    . Liver laceration repair  09/2014    related to CPR/notes 11/22/2014  . Tracheostomy  09/2014    "closed on it's own when they took it out"  . Femerol artery repair Right 09/2014  pt insists it was a right femoral artery repair on 11/22/2014  . Extracorporeal circulation    . Left ventricular assist device      pt is not aware of this hx on 11/22/2014  . Abdominal hysterectomy  ~ 2000  . Tubal ligation  1982  . Coronary angioplasty with stent placement  09/2014    "multiple"/notes 11/22/2014  . Application of wound vac  10/2014    "over my naval" (11/22/2014)     Current Outpatient Prescriptions  Medication Sig Dispense Refill  . acetaminophen (TYLENOL) 500 MG tablet Take 1,000 mg by mouth 2 (two) times daily as needed (pain).     [name] aspirin EC 81 MG tablet Take 1 tablet (81 mg total) by mouth daily.      . clotrimazole (LOTRIMIN) 1 % external solution Apply 1 application topically 2 (two) times daily. In between toes 30 mL 6  . furosemide (LASIX) 40 MG tablet Take 1 tablet (40 mg total) by mouth daily at 12 noon. 30 tablet   . losartan (COZAAR) 25 MG tablet Take 25 mg by mouth daily.    . magnesium oxide (MAG-OX) 400 MG tablet Take 1,200 mg by mouth daily.     . metFORMIN (GLUCOPHAGE-XR) 500 MG 24 hr tablet Take by mouth daily with breakfast.     . metoprolol succinate (TOPROL XL) 25 MG 24 hr tablet Take 1 tablet (25 mg total) by mouth daily. 30 tablet 8  . nitroGLYCERIN (NITROSTAT) 0.4 MG SL tablet Place 0.4 mg under the tongue every 5 (five) minutes as needed for chest pain. Take every 5 minutes for max of 3 if no relief call 911    . oxyCODONE-acetaminophen (PERCOCET/ROXICET) 5-325 MG tablet Take 1-2 tablets by mouth every 6 (six) hours as needed for severe pain. 10 tablet 0  . potassium chloride SA (K-DUR,KLOR-CON) 20 MEQ tablet Take 20 mEq by mouth daily.     . rosuvastatin (CRESTOR) 20 MG tablet Take 1 tablet three times a week 30 tablet 2  . sertraline (ZOLOFT) 50 MG tablet Take 50 mg by mouth daily.    . ticagrelor (BRILINTA) 90 MG TABS tablet Take 1 tablet (90 mg total) by mouth 2 (two) times daily. 60 tablet 11  . prochlorperazine (COMPAZINE) 10 MG tablet Take 10 mg by mouth every 6 (six) hours as needed. nausea     No current facility-administered medications for this visit.    Allergies:   Dilaudid; Hydromorphone; Epinephrine; and Lactose intolerance (gi)    Social History:  The patient  reports that she has never smoked. She has never used smokeless tobacco. She reports that she does not drink alcohol or use illicit drugs.   Family History:  The patient's family history includes Alzheimer's disease in her father; COPD in her mother; Diabetes in her brother and mother; Heart attack in her father; Hypertension in her mother. There is no history of Stroke.    ROS:  Please see the  history of present illness.   Otherwise, review of systems are positive for none.   All other systems are reviewed and negative.    PHYSICAL EXAM: VS:  BP 119/73 mmHg  Pulse 88  Ht 5\' 2"  (1.575 m)  Wt 184 lb (83.462 kg)  BMI 33.65 kg/m2 , BMI Body mass index is 33.65 kg/(m^2). GENERAL:  Well appearing HEENT:  Pupils equal round and reactive, fundi not visualized, oral mucosa unremarkable NECK:  No jugular venous distention, waveform within normal limits, carotid upstroke brisk  and symmetric, no bruits, no thyromegaly LYMPHATICS:  No cervical adenopathy LUNGS:  Clear to auscultation bilaterally HEART:  RRR.  PMI not displaced or sustained,S1 and S2 within normal limits, no S3, no S4, no clicks, no rubs, II/VI systolic murmur at LUSB ABD:  Flat, positive bowel sounds normal in frequency in pitch, no bruits, no rebound, no guarding, no midline pulsatile mass, no hepatomegaly, no splenomegaly EXT:  2 plus pulses throughout, no edema, no cyanosis no clubbing SKIN:  No rashes no nodules NEURO:  Cranial nerves II through XII grossly intact, motor grossly intact throughout PSYCH:  Cognitively intact, oriented to person place and time  EKG:  EKG is not ordered today. 02/11/16: Sinus arrhythmia.  Rate 71 bpm. Non-specific ST-T changes.   Recent Labs: 03/27/2016: Brain Natriuretic Peptide 101.2* 04/10/2016: ALT 12*; BUN 5*; Creatinine, Ser 0.67; Hemoglobin 11.3*; Platelets 215; Potassium 4.3; Sodium 135    Lipid Panel    Component Value Date/Time   CHOL 142 03/27/2016 1042   TRIG 59 03/27/2016 1042   HDL 63 03/27/2016 1042   CHOLHDL 2.3 03/27/2016 1042   VLDL 12 03/27/2016 1042   LDLCALC 67 03/27/2016 1042      Wt Readings from Last 3 Encounters:  06/16/16 184 lb (83.462 kg)  03/27/16 182 lb 9.6 oz (82.827 kg)  02/11/16 177 lb 12.8 oz (80.65 kg)      ASSESSMENT AND PLAN:  # Chronic systolic and diastolic heart failure:  LVEF 40% by echo, 46% by nuclear stress test.  Marie Tanner  appears to be euvolemic.  Symptoms are somewhat worse after chemotherapy. We will repeat her echocardiogram. Continue losartan, metoprolol, and furosemide.   # hypertensive heart disease: BP well-controlled on metoprolol and losartan.   # Hyperlipidemia:  Rosuvastatin was reduced due to muscle cramping. We will repeat fasting lipids.   # CAD s/p STEMI: Continue aspirin and ticgrelor through at least 09/2016.  Consider reduced dose of ticagrelor at that time.  OK to hold for port removal if necessary.  Continue metoprolol.   Current medicines are reviewed at length with the patient today.  The patient does not have concerns regarding medicines.  The following changes have been made:  no change  Labs/ tests ordered today include:   No orders of the defined types were placed in this encounter.     Disposition:   FU with Zanden Colver C. Oval Linsey, MD, [facility]  In 3 months.    This note was written with the assistance of speech recognition software.  Please excuse any transcriptional errors.  Signed, Bakary Bramer C. Oval Linsey, MD, [facility]  06/16/2016 10:35 AM    [facility]

## 2016-06-16 ENCOUNTER — Ambulatory Visit (INDEPENDENT_AMBULATORY_CARE_PROVIDER_SITE_OTHER): Payer: BC Managed Care – PPO | Admitting: Cardiovascular Disease

## 2016-06-16 ENCOUNTER — Encounter: Payer: Self-pay | Admitting: Cardiovascular Disease

## 2016-06-16 VITALS — BP 119/73 | HR 88 | Ht 62.0 in | Wt 184.0 lb

## 2016-06-16 DIAGNOSIS — E785 Hyperlipidemia, unspecified: Secondary | ICD-10-CM | POA: Diagnosis not present

## 2016-06-16 DIAGNOSIS — I5042 Chronic combined systolic (congestive) and diastolic (congestive) heart failure: Secondary | ICD-10-CM | POA: Diagnosis not present

## 2016-06-16 DIAGNOSIS — I110 Hypertensive heart disease with heart failure: Secondary | ICD-10-CM | POA: Diagnosis not present

## 2016-06-16 DIAGNOSIS — Z09 Encounter for follow-up examination after completed treatment for conditions other than malignant neoplasm: Secondary | ICD-10-CM | POA: Diagnosis not present

## 2016-06-16 NOTE — Patient Instructions (Signed)
Medication Instructions:  Your physician recommends that you continue on your current medications as directed. Please refer to the Current Medication list given to you today.  Labwork: FASTING LIPID PANEL AT [facility] ON THE FIRST FLOOR  Testing/Procedures: Your physician has requested that you have an echocardiogram. Echocardiography is a painless test that uses sound waves to create images of your heart. It provides your doctor with information about the size and shape of your heart and how well your heart's chambers and valves are working. This procedure takes approximately one hour. There are no restrictions for this procedure.  Follow-Up: Your physician wants you to follow-up in: 3 [place] will receive a reminder letter in the mail two months in advance. If you don't receive a letter, please call our office to schedule the follow-up appointment.  If you need a refill on your cardiac medications before your next appointment, please call your pharmacy.

## 2016-06-18 NOTE — Telephone Encounter (Signed)
LMTCB about getting labs scheduled and follow-up visit.

## 2016-06-29 LAB — LIPID PANEL
CHOL/HDL RATIO: 3.7 ratio (ref <=5.0)
Cholesterol: 177 mg/dL (ref 125–200)
HDL: 48 mg/dL (ref >=46)
LDL CALC: 111 mg/dL (ref <130)
TRIGLYCERIDES: 91 mg/dL (ref <150)
VLDL: 18 mg/dL (ref <30)

## 2016-07-01 ENCOUNTER — Other Ambulatory Visit (HOSPITAL_COMMUNITY): Payer: Self-pay

## 2016-07-01 ENCOUNTER — Ambulatory Visit (HOSPITAL_COMMUNITY): Payer: BC Managed Care – PPO | Attending: Cardiovascular Disease

## 2016-07-01 DIAGNOSIS — I340 Nonrheumatic mitral (valve) insufficiency: Secondary | ICD-10-CM | POA: Diagnosis not present

## 2016-07-01 DIAGNOSIS — C50919 Malignant neoplasm of unspecified site of unspecified female breast: Secondary | ICD-10-CM | POA: Diagnosis not present

## 2016-07-01 DIAGNOSIS — E119 Type 2 diabetes mellitus without complications: Secondary | ICD-10-CM | POA: Diagnosis not present

## 2016-07-01 DIAGNOSIS — I110 Hypertensive heart disease with heart failure: Secondary | ICD-10-CM | POA: Diagnosis not present

## 2016-07-01 DIAGNOSIS — I5042 Chronic combined systolic (congestive) and diastolic (congestive) heart failure: Secondary | ICD-10-CM

## 2016-07-01 DIAGNOSIS — Z09 Encounter for follow-up examination after completed treatment for conditions other than malignant neoplasm: Secondary | ICD-10-CM | POA: Diagnosis present

## 2016-07-01 DIAGNOSIS — I071 Rheumatic tricuspid insufficiency: Secondary | ICD-10-CM | POA: Insufficient documentation

## 2016-07-08 ENCOUNTER — Telehealth: Payer: Self-pay | Admitting: Cardiovascular Disease

## 2016-07-08 NOTE — Telephone Encounter (Signed)
-----   Message from Skeet Latch, MD sent at 07/03/2016  5:47 PM EDT ----- Echo shows that her heart squeezes well but does not relax completely.  This is a mild change and will not cause symptoms unless it worsens.  It will be important to keep her blood pressure under good control.  Mild leaking of the mitral valve and tricuspid valve.

## 2016-07-08 NOTE — Telephone Encounter (Signed)
Advised patient of results.  

## 2016-07-08 NOTE — Telephone Encounter (Signed)
-----   Message from Skeet Latch, MD sent at 07/03/2016  5:28 PM EDT ----- Cholesterol levels are excellent.

## 2016-07-08 NOTE — Telephone Encounter (Signed)
New Message  Pt returning phone call of Alvina Filbert.  Please Follow up

## 2016-07-16 ENCOUNTER — Ambulatory Visit
Admission: RE | Admit: 2016-07-16 | Discharge: 2016-07-16 | Disposition: A | Discharge status: Home / Self Care | Payer: BC Managed Care – PPO | Source: Ambulatory Visit | Attending: Family Medicine | Admitting: Family Medicine

## 2016-07-16 ENCOUNTER — Other Ambulatory Visit: Payer: Self-pay | Admitting: Family Medicine

## 2016-07-16 DIAGNOSIS — R0600 Dyspnea, unspecified: Secondary | ICD-10-CM | POA: Diagnosis not present

## 2016-09-01 ENCOUNTER — Encounter: Payer: Self-pay | Admitting: Sports Medicine

## 2016-09-01 ENCOUNTER — Ambulatory Visit (INDEPENDENT_AMBULATORY_CARE_PROVIDER_SITE_OTHER): Payer: BC Managed Care – PPO | Admitting: Sports Medicine

## 2016-09-01 DIAGNOSIS — L84 Corns and callosities: Secondary | ICD-10-CM

## 2016-09-01 DIAGNOSIS — B351 Tinea unguium: Secondary | ICD-10-CM | POA: Diagnosis not present

## 2016-09-01 DIAGNOSIS — M79672 Pain in left foot: Secondary | ICD-10-CM | POA: Diagnosis not present

## 2016-09-01 DIAGNOSIS — M79671 Pain in right foot: Secondary | ICD-10-CM

## 2016-09-01 DIAGNOSIS — E1141 Type 2 diabetes mellitus with diabetic mononeuropathy: Secondary | ICD-10-CM | POA: Diagnosis not present

## 2016-09-01 DIAGNOSIS — B353 Tinea pedis: Secondary | ICD-10-CM

## 2016-09-01 MED ORDER — TERBINAFINE HCL 1 % EX CREA: 1.0000 "application " | TOPICAL_CREAM | Freq: Two times a day (BID) | CUTANEOUS | 3 refills | Status: DC

## 2016-09-01 MED ORDER — CLOTRIMAZOLE 1 % EX SOLN: 1.0000 "application " | Freq: Two times a day (BID) | CUTANEOUS | 6 refills | Status: DC

## 2016-09-01 NOTE — Progress Notes (Signed)
Patient ID: Marie Tanner, female   DOB: 11/24/1956, 60 y.o.   MRN: [id number] Subjective: Marie Tanner is a 60 y.o. female patient with history of diabetes who presents to office today complaining of long, painful nails  while ambulating in shoes; unable to trim and dry skin to both feet; have not gotten cream but solution in between toes help. Patient states that the glucose reading this morning was "good" around 90 Patient denies any new changes in medication or new problems. Patient denies any new cramping, numbness, burning or tingling in the legs. Admits to ocassional tingling and burning in feet, unchanged from prior.   Patient Active Problem List   Diagnosis Date Noted  . Malignant neoplasm of female breast ([place]) 01/24/2016  . Abnormal finding on mammography 01/17/2016  . Chest pain 06/06/2015  . Diarrhea 06/06/2015  . Essential hypertension 06/06/2015  . Depression   . Encounter for therapeutic drug monitoring 02/19/2015  . Mitral regurgitation 01/30/2015  . 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[place]) 01/30/2015  . Left ventricular systolic dysfunction [id number]  . Dyspnea 11/24/2014  . Cardiomyopathy ([place]) 11/24/2014  . DVT (deep venous thrombosis) ([place]) 11/24/2014  . DM type 2 (diabetes mellitus, type 2) ([place]) 11/24/2014  . 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 Controlled type 2 diabetes mellitus without complication ([place]) [id number]  . High sodium levels 10/29/2014  . Elevated WBC count 10/29/2014  . Acute confusion due to medical condition 10/27/2014  . Insomnia due to medical condition 10/27/2014  . Acute respiratory failure ([place]) 10/24/2014  . 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[place]) 10/24/2014  . Common femoral artery injury 10/21/2014  . Injury of kidney 10/20/2014  . Cardiogenic shock ([place]) 10/20/2014  . Laceration of liver 10/20/2014  . ST elevation myocardial infarction (STEMI) ([place]) 10/20/2014  . Diabetes  mellitus ([place]) 04/19/2013   Current Outpatient Prescriptions on File Prior to Visit  Medication Sig Dispense Refill  . acetaminophen (TYLENOL) 500 MG tablet Take 1,000 mg by mouth 2 (two) times daily as needed (pain).     [name] aspirin EC 81 MG tablet Take 1 tablet (81 mg total) by mouth daily.    . furosemide (LASIX) 40 MG tablet Take 1 tablet (40 mg total) by mouth daily at 12 noon. 30 tablet   . losartan (COZAAR) 25 MG tablet Take 25 mg by mouth daily.    . magnesium oxide (MAG-OX) 400 MG tablet Take 1,200 mg by mouth daily.     . metFORMIN (GLUCOPHAGE-XR) 500 MG 24 hr tablet Take by mouth daily with breakfast.     . metoprolol succinate (TOPROL XL) 25 MG 24 hr tablet Take 1 tablet (25 mg total) by mouth daily. 30 tablet 8  . nitroGLYCERIN (NITROSTAT) 0.4 MG SL tablet Place 0.4 mg under the tongue every 5 (five) minutes as needed for chest pain. Take every 5 minutes for max of 3 if no relief call 911    . oxyCODONE-acetaminophen (PERCOCET/ROXICET) 5-325 MG tablet Take 1-2 tablets by mouth every 6 (six) hours as needed for severe pain. 10 tablet 0  . potassium chloride SA (K-DUR,KLOR-CON) 20 MEQ tablet Take 20 mEq by mouth daily.     . prochlorperazine (COMPAZINE) 10 MG tablet Take 10 mg by mouth every 6 (six) hours as needed. nausea    . rosuvastatin (CRESTOR) 20 MG tablet Take 1 tablet three times a week 30 tablet 2  . sertraline (ZOLOFT) 50 MG tablet Take 50 mg by mouth daily.    [name]  ticagrelor (BRILINTA) 90 MG TABS tablet Take 1 tablet (90 mg total) by mouth 2 (two) times daily. 60 tablet 11   No current facility-administered medications on file prior to visit.    Allergies  Allergen Reactions  . Dilaudid [Hydromorphone Hcl] Anaphylaxis, Itching and Swelling  . Hydromorphone Anaphylaxis, Itching and Swelling  . Epinephrine Anxiety and Other (See Comments)    Severe anxiety  . Lactose Intolerance (Gi) Diarrhea    Recent Results (from the past 2160 hour(s))  Lipid panel     Status: None    Collection Time: 06/29/16  9:00 AM  Result Value Ref Range   Cholesterol 177 125 - 200 mg/dL   Triglycerides 91 <150 mg/dL   HDL 48 >=46 mg/dL   Total CHOL/HDL Ratio 3.7 <=5.0 Ratio   VLDL 18 <30 mg/dL   LDL Cholesterol 111 <130 mg/dL    Comment:   Total Cholesterol/HDL Ratio:CHD Risk                        Coronary Heart Disease Risk Table                                        Men       Women          1/2 Average Risk              3.4        3.3              Average Risk              5.0        4.4           2X Average Risk              9.6        7.1           3X Average Risk             23.4       11.0 Use the calculated Patient Ratio above and the CHD Risk table  to determine the patient's CHD Risk.     Objective: General: Patient is awake, alert, and oriented x 3 and in no acute distress.  Integument: Skin is warm, dry and supple bilateral. Nails are tender, long, thickened and  dystrophic with subungual debris, consistent with onychomycosis, 1-5 bilateral. + scaly skin in mocasson distrubution suggestive on tinea that is also interdigital, improving. No open lesions or preulcerative lesions present bilateral, Corn to left 5th toe with no signs of infection. Remaining integument unremarkable.  Vasculature:  Dorsalis Pedis pulse 2/4 bilateral. Posterior Tibial pulse  1/4 bilateral.  Capillary fill time <3 sec 1-5 bilateral. Positive hair growth to the level of the digits. Temperature gradient within normal limits. No varicosities present bilateral. No edema present bilateral.   Neurology: The patient has intact sensation measured with a 5.07/10g Semmes Weinstein Monofilament at all pedal sites bilateral . Vibratory sensation diminished bilateral with tuning fork. No Babinski sign present bilateral. Subjective tingling.   Musculoskeletal: Asymptomatic hammertoe pedal deformities noted bilateral. Muscular strength 5/5 in all lower extremity muscular groups bilateral without  pain on range of motion . No tenderness with calf compression bilateral.  Assessment and Plan: Problem List Items Addressed This Visit    None    Visit Diagnoses  Dermatophytosis, nail    -  Primary   Relevant Medications   clotrimazole (LOTRIMIN) 1 % external solution   terbinafine (LAMISIL AT) 1 % cream   Tinea pedis of both feet       Relevant Medications   clotrimazole (LOTRIMIN) 1 % external solution   terbinafine (LAMISIL AT) 1 % cream   Callus of foot       Relevant Medications   terbinafine (LAMISIL AT) 1 % cream   Diabetic mononeuropathy associated with type 2 diabetes mellitus (HCC)       Relevant Medications   terbinafine (LAMISIL AT) 1 % cream   Foot pain, bilateral       Relevant Medications   terbinafine (LAMISIL AT) 1 % cream      -Examined patient. -Discussed and educated patient on diabetic foot care, especially with regards to the vascular, neurological and musculoskeletal systems.  -Stressed the importance of good glycemic control and the detriment of not  controlling glucose levels in relation to the foot. -Mechanically debrided corn at left 5th toe using sterile chisel blade and all nails 1-5 bilateral using sterile nail nipper and filed with dremel without incident  -Refilled lamisil cream and clotrimazole solution  -Answered all patient questions -Patient to return  in 3 months for at risk foot care -Patient advised to call the office if any problems or questions arise in the meantime.  Landis Martins, DPM

## 2016-10-04 NOTE — Progress Notes (Signed)
Cardiology Office Note   Date:  10/05/2016   ID:  Marie Tanner, DOB June 11, 1956, MRN [id number]  PCP:  Lynnell Jude, MD  Cardiologist:   Skeet Latch, MD  Oncologist: Samuel Bouche, MD Radiation Oncologist: Ladona Horns, MD Surgical Oncologist: Nancie Neas, MD  Chief Complaint  Patient presents with  . Follow-up    3 months;   . Shortness of Breath    randomly.  . Pain    in legs    History of Present Illness: Marie Tanner is a 60 y.o. 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  Marie Tanner was previously a patient of [name].  In 2015 she had a STEMI and cardiac arrest. She was cared for at [facility]. She ultimately received 2 DES (LM/LAD and LCx).  She had a prolonged hospitalization requiring tracheostomy.  Her liver was lacerated during CPR.  She required surgical repair after femoral catheterization.  She also developed a DVT in the hospital.  One week after discharge she developed pneumonia and was admitted at [facility].  She has been seen in the hospital twice for chest pain and ruled out for MI.  She had a negative Myoview 05/2015.  She also had negative CT scans for PE.  Marie Tanner also underwent surgery for breast cancer on 02/21/16.   Marie Tanner completed her radiation in September.  Since then she has been feeling mostly well.  She reports some days where she feels very tired. She also notes that she sometimes awakens feeling short of breath. She was diagnosed with sleep apnea and wore a CPAP machine until her MI in 2015. She denies lower extremity edema, orthopnea, or PND. She also denies chest pain. She has not been exercising regularly but plans to enroll in an exercise program for breast cancer survivors. She will start exercising 3 times per week.  After her last appointment Marie Tanner had an echo that showed LVEF 50-55% with grade 1 diastolic dysfunction.  She does report some exertional shortness of breath as well as knee and leg pain when she walks for long distances.   BNP was 101 on 02/2016.  Past Medical History:  Diagnosis Date  . Cancer ([place])   . Coronary artery disease   . Depression   . DVT (deep venous thrombosis) ([place]) 11/15   left femerol artery injury during resusciation.  [name] GERD (gastroesophageal reflux disease)   . HCAP (healthcare-associated pneumonia) 11/22/2014  . Heart murmur   . High cholesterol   . History of blood transfusion 09/2014   related to MI  . History of gout   . Hypertension   . Liver laceration 10/19/14  . Pulmonary edema 10/19/14  . Renal disorder   . Sleep apnea    "suppose to wear mask but I don't" (11/22/2014)  . STEMI (ST elevation myocardial infarction) ([place]) 10/19/14  . Type II diabetes mellitus ([place])     Past Surgical History:  Procedure Laterality Date  . ABDOMINAL HYSTERECTOMY  ~ 2000  . APPLICATION OF WOUND VAC  10/2014   "over my naval" (11/22/2014)  . [place]; 1982  . CORONARY ANGIOPLASTY WITH STENT PLACEMENT  09/2014   "multiple"/notes 11/22/2014  . EXTRACORPOREAL CIRCULATION    . Femerol artery repair Right 09/2014   pt insists it was a right femoral artery repair on  11/22/2014  . LAPAROSCOPIC ABDOMINAL EXPLORATION    . LEFT VENTRICULAR ASSIST DEVICE     pt is not aware of this hx on 11/22/2014  . Liver laceration repair  09/2014   related to CPR/notes 11/22/2014  . TRACHEOSTOMY  09/2014   "closed on it's own when they took it out"  . TUBAL LIGATION  1982     Current Outpatient Prescriptions  Medication Sig Dispense Refill  . acetaminophen (TYLENOL) 500 MG tablet Take 1,000 mg by mouth 2 (two) times daily as needed (pain).     [name] aspirin EC 81 MG tablet Take 1 tablet (81 mg total) by mouth daily.    . clotrimazole (LOTRIMIN) 1 % external solution Apply 1 application topically 2  (two) times daily. In between toes 30 mL 6  . furosemide (LASIX) 40 MG tablet Take 1 tablet (40 mg total) by mouth daily at 12 noon. 30 tablet   . losartan (COZAAR) 25 MG tablet Take 25 mg by mouth daily.    . magnesium oxide (MAG-OX) 400 MG tablet Take 1,200 mg by mouth daily.     . metFORMIN (GLUCOPHAGE-XR) 500 MG 24 hr tablet Take by mouth daily with breakfast.     . metoprolol succinate (TOPROL XL) 25 MG 24 hr tablet Take 1 tablet (25 mg total) by mouth daily. 30 tablet 8  . nitroGLYCERIN (NITROSTAT) 0.4 MG SL tablet Place 0.4 mg under the tongue every 5 (five) minutes as needed for chest pain. Take every 5 minutes for max of 3 if no relief call 911    . potassium chloride SA (K-DUR,KLOR-CON) 20 MEQ tablet Take 20 mEq by mouth daily.     . rosuvastatin (CRESTOR) 20 MG tablet Take 1 tablet three times a week 30 tablet 2  . sertraline (ZOLOFT) 50 MG tablet Take 50 mg by mouth daily.    [name] terbinafine (LAMISIL AT) 1 % cream Apply 1 application topically 2 (two) times daily. 42 g 3  . ticagrelor (BRILINTA) 60 MG TABS tablet Take 1 tablet (60 mg total) by mouth 2 (two) times daily. 180 tablet 3   No current facility-administered medications for this visit.     Allergies:   Dilaudid [hydromorphone hcl]; Hydromorphone; Epinephrine; and Lactose intolerance (gi)    Social History:  The patient  reports that she has never smoked. She has never used smokeless tobacco. She reports that she does not drink alcohol or use drugs.   Family History:  The patient's family history includes Alzheimer's disease in her father; COPD in her mother; Diabetes in her brother and mother; Heart attack in her father; Hypertension in her mother.    ROS:  Please see the history of present illness.   Otherwise, review of systems are positive for none.   All other systems are reviewed and negative.    PHYSICAL EXAM: VS:  BP 116/79   Pulse 85   Ht 5\' 2"  (1.575 m)   Wt 87.2 kg (192 lb 3.2 oz)   BMI 35.15 kg/m  , BMI  Body mass index is 35.15 kg/m. GENERAL:  Well appearing HEENT:  Pupils equal round and reactive, fundi not visualized, oral mucosa unremarkable NECK:  No jugular venous distention, waveform within normal limits, carotid upstroke brisk and symmetric, no bruits LYMPHATICS:  No cervical adenopathy LUNGS:  Clear to auscultation bilaterally HEART:  RRR.  PMI not displaced or sustained,S1 and S2 within normal limits, no S3, no S4, no clicks, no rubs, II/VI systolic murmur at LUSB  ABD:  Flat, positive bowel sounds normal in frequency in pitch, no bruits, no rebound, no guarding, no midline pulsatile mass, no hepatomegaly, no splenomegaly EXT:  2 plus pulses throughout, right upper extremity edema with lymphedema brace in place, no lower extremity edema, no cyanosis no clubbing SKIN:  No rashes no nodules NEURO:  Cranial nerves II through XII grossly intact, motor grossly intact throughout PSYCH:  Cognitively intact, oriented to person place and time  EKG:  EKG is ordered today. 02/11/16: Sinus arrhythmia.  Rate 71 bpm. Non-specific ST-T changes. 10/05/16: Sinus rhythm. Rate 85 bpm. Prior inferior infarct.   Echo 07/01/16: Study Conclusions  - Left ventricle: The cavity size was normal. Systolic function was   normal. The estimated ejection fraction was in the range of 50%   to 55%. Wall motion was normal; there were no regional wall   motion abnormalities. Doppler parameters are consistent with   abnormal left ventricular relaxation (grade 1 diastolic   dysfunction). Doppler parameters are consistent with   indeterminate ventricular filling pressure. - Aortic valve: Transvalvular velocity was within the normal range.   There was no stenosis. There was no regurgitation. - Mitral valve: There was mild regurgitation. - Left atrium: The atrium was mildly dilated. - Right ventricle: The cavity size was normal. Wall thickness was   normal. Systolic function was normal. - Atrial septum: No defect  or patent foramen ovale was identified   by color flow Doppler. - Tricuspid valve: There was mild regurgitation. - Pulmonary arteries: Systolic pressure was within the normal   range. PA peak pressure: 23 mm Hg (S).  Recent Labs: 03/27/2016: Brain Natriuretic Peptide 101.2 04/10/2016: ALT 12; BUN 5; Creatinine, Ser 0.67; Hemoglobin 11.3; Platelets 215; Potassium 4.3; Sodium 135    Lipid Panel    Component Value Date/Time   CHOL 177 06/29/2016 0900   TRIG 91 06/29/2016 0900   HDL 48 06/29/2016 0900   CHOLHDL 3.7 06/29/2016 0900   VLDL 18 06/29/2016 0900   LDLCALC 111 06/29/2016 0900      Wt Readings from Last 3 Encounters:  10/05/16 87.2 kg (192 lb 3.2 oz)  06/16/16 83.5 kg (184 lb)  03/27/16 82.8 kg (182 lb 9.6 oz)      ASSESSMENT AND PLAN:  # Chronic systolic and diastolic heart failure:  LVEF 40% by echo 05/2015 improved to 50-55% 06/2016. 46% by nuclear stress test.  BNP 101 02/2016.  Continue losartan and metoprolol.  # Hypertensive heart disease: BP well-controlled on metoprolol and losartan.   # Hyperlipidemia:  Rosuvastatin was reduced due to muscle cramping. We will repeat fasting lipids and LFTs today.  Goal LDL is 70.  # CAD s/p STEMI: Marie Tanner had ST elevation myocardial infarction with stents placed in the LAD/left main and left circumflex arteries. Given the severity of her disease and the placement of her stents we will plan to reduce ticagrelor to 60 mg rather than stopping it at this time.  Continue aspirin, metoprolol, and rosuvastatin.  # OSA: Refer for sleep study she has a CPAP machine and has not been using it for years. She reports nighttime awakening and shortness of breath..    # Obesity:  BMI 35.  Her weight has been steadily increasing. I encouraged her to enroll in the exercise and weight loss program as planned. I suspect this might be a big part of her shortness of breath.    Current medicines are reviewed at length with the patient  today.  The  patient does not have concerns regarding medicines.  The following changes have been made:  Decrease ticagrelor to 60mg .   Labs/ tests ordered today include:   Orders Placed This Encounter  Procedures  . Lipid panel  . Comprehensive metabolic panel  . EKG 12-Lead  . Split night study     Disposition:   FU with Marie Huebert C. Oval Linsey, MD, [facility]  In 3 months.    This note was written with the assistance of speech recognition software.  Please excuse any transcriptional errors.  Signed, Caswell Alvillar C. Oval Linsey, MD, [facility]  10/05/2016 9:11 AM    [place]

## 2016-10-05 ENCOUNTER — Ambulatory Visit (INDEPENDENT_AMBULATORY_CARE_PROVIDER_SITE_OTHER): Payer: BC Managed Care – PPO | Admitting: Cardiovascular Disease

## 2016-10-05 ENCOUNTER — Encounter: Payer: Self-pay | Admitting: Cardiovascular Disease

## 2016-10-05 VITALS — BP 116/79 | HR 85 | Ht 62.0 in | Wt 192.2 lb

## 2016-10-05 DIAGNOSIS — Z955 Presence of coronary angioplasty implant and graft: Secondary | ICD-10-CM

## 2016-10-05 DIAGNOSIS — I110 Hypertensive heart disease with heart failure: Secondary | ICD-10-CM | POA: Diagnosis not present

## 2016-10-05 DIAGNOSIS — G4730 Sleep apnea, unspecified: Secondary | ICD-10-CM | POA: Diagnosis not present

## 2016-10-05 DIAGNOSIS — I5042 Chronic combined systolic (congestive) and diastolic (congestive) heart failure: Secondary | ICD-10-CM | POA: Diagnosis not present

## 2016-10-05 DIAGNOSIS — E7800 Pure hypercholesterolemia, unspecified: Secondary | ICD-10-CM

## 2016-10-05 MED ORDER — TICAGRELOR 60 MG PO TABS: 60.0000 mg | ORAL_TABLET | Freq: Two times a day (BID) | ORAL | 3 refills | Status: DC

## 2016-10-05 NOTE — Patient Instructions (Addendum)
Medication Instructions:  DECREASE YOUR BRILINTA TO 60 MG TWICE A DAY   Labwork: LP/CMET AT [facility]  Testing/Procedures: Your physician has recommended that you have a sleep study. This test records several body functions during sleep, including: brain activity, eye movement, oxygen and carbon dioxide blood levels, heart rate and rhythm, breathing rate and rhythm, the flow of air through your mouth and nose, snoring, body muscle movements, and chest and belly movement.  Follow-Up: Your physician wants you to follow-up in: [place] will receive a reminder letter in the mail two months in advance. If you don't receive a letter, please call our office to schedule the follow-up appointment.  If you need a refill on your cardiac medications before your next appointment, please call your pharmacy.

## 2016-10-06 LAB — COMPREHENSIVE METABOLIC PANEL
ALK PHOS: 78 U/L (ref 33–130)
ALT: 9 U/L (ref 6–29)
AST: 16 U/L (ref 10–35)
Albumin: 4 g/dL (ref 3.6–5.1)
BUN: 10 mg/dL (ref 7–25)
CO2: 24 mmol/L (ref 20–31)
Calcium: 9.5 mg/dL (ref 8.6–10.4)
Chloride: 103 mmol/L (ref 98–110)
Creat: 0.61 mg/dL (ref 0.50–1.05)
GLUCOSE: 79 mg/dL (ref 65–99)
POTASSIUM: 4.9 mmol/L (ref 3.5–5.3)
Sodium: 138 mmol/L (ref 135–146)
Total Bilirubin: 0.8 mg/dL (ref 0.2–1.2)
Total Protein: 7.5 g/dL (ref 6.1–8.1)

## 2016-10-06 LAB — LIPID PANEL
CHOL/HDL RATIO: 3.1 ratio (ref <5.0)
Cholesterol: 156 mg/dL (ref <200)
HDL: 50 mg/dL — ABNORMAL LOW (ref >50)
LDL CALC: 91 mg/dL
Triglycerides: 76 mg/dL (ref <150)
VLDL: 15 mg/dL (ref <30)

## 2016-10-16 ENCOUNTER — Other Ambulatory Visit: Payer: Self-pay | Admitting: Cardiovascular Disease

## 2016-10-16 NOTE — Telephone Encounter (Signed)
Please review for refill. Thanks!  

## 2016-10-16 NOTE — Telephone Encounter (Signed)
Rx request sent to pharmacy.  

## 2016-11-16 ENCOUNTER — Ambulatory Visit (INDEPENDENT_AMBULATORY_CARE_PROVIDER_SITE_OTHER): Payer: BC Managed Care – PPO | Admitting: Pharmacist Clinician (PhC)/ Clinical Pharmacy Specialist

## 2016-11-16 DIAGNOSIS — E785 Hyperlipidemia, unspecified: Secondary | ICD-10-CM | POA: Insufficient documentation

## 2016-11-16 NOTE — Assessment & Plan Note (Signed)
Patient with ASCVD and LDL at 91 on Crestor 20 mg three times weekly.  Discussed option of adding ezetimibe 10 mg daily, however patient will be starting dietary/exercise programs with goal to lose weight and improve diet.  Discussed dietary options for lowering cholesterol and she will work on these for the next 3 months.  Will repeat labs in February and have her back to the office after that for review.

## 2016-11-16 NOTE — Patient Instructions (Signed)
Continue with the rosuvastatin 20 mg three times each week  Start your exercise and nutrition programs.   We will have you repeat labs in late February and come back for a visit after that.   Cholesterol Cholesterol is a fat. Your body needs a small amount of cholesterol. Cholesterol (plaque) may build up in your blood vessels (arteries). That makes you more likely to have a heart attack or stroke. You cannot feel your cholesterol level. Having a blood test is the only way to find out if your level is high. Keep your test results. Work with your doctor to keep your cholesterol at a good level. What do the results mean?  Total cholesterol is how much cholesterol is in your blood.  LDL is bad cholesterol. This is the type that can build up. Try to have low LDL.  HDL is good cholesterol. It cleans your blood vessels and carries LDL away. Try to have high HDL.  Triglycerides are fat that the body can store or burn for energy. What are good levels of cholesterol?  Total cholesterol below 200.  LDL below 100 is good for people who have health risks. LDL below 70 is good for people who have very high risks.  HDL above 40 is good. It is best to have HDL of 60 or higher.  Triglycerides below 150. How can I lower my cholesterol? Diet  Follow your diet program as told by your doctor.  Choose fish, white meat chicken, or [place] that is roasted or baked. Try not to eat red meat, fried foods, sausage, or lunch meats.  Eat lots of fresh fruits and vegetables.  Choose whole grains, beans, pasta, potatoes, and cereals.  Choose olive oil, corn oil, or canola oil. Only use small amounts.  Try not to eat butter, mayonnaise, shortening, or palm kernel oils.  Try not to eat foods with trans fats.  Choose low-fat or nonfat dairy foods.  Drink skim or nonfat milk.  Eat low-fat or nonfat yogurt and cheeses.  Try not to drink whole milk or cream.  Try not to eat ice cream, egg yolks, or  full-fat cheeses.  Healthy desserts include angel food cake, ginger snaps, animal crackers, hard candy, popsicles, and low-fat or nonfat frozen yogurt. Try not to eat pastries, cakes, pies, and cookies. Exercise  Follow your exercise program as told by your doctor.  Be more active. Try gardening, walking, and taking the stairs.  Ask your doctor about ways that you can be more active. Medicine  Take over-the-counter and prescription medicines only as told by your doctor. This information is not intended to replace advice given to you by your health care provider. Make sure you discuss any questions you have with your health care provider. Document Released: 02/12/2009 Document Revised: 06/17/2016 Document Reviewed: 05/28/2016 Elsevier Interactive Patient Education  2017 [employer].

## 2016-11-16 NOTE — Progress Notes (Signed)
Patient ID: Marie Tanner                 DOB: 1956-02-18                    MRN: [id number]     HPI: Marie Tanner is a 60 y.o. female patient referred to lipid clinic by Dr. Oval Linsey. She has a history of MI in 2015, with 2 stents placed.  This all occurred at [facility].  Since then she was diagnosed with breast cancer earlier this year and has recently finished her radiation therapy at [facility], where she is also employed.  She was started on Crestor 40 mg after failing atorvastatin 80 and 40 mg doses, due to myalgias.  She was unable to tolerate the high dose of Crestor, and has worked down to a current dose of 20 mg three times weekly.  She notes some myalgias even with this, but they are minor and not problematic at this time.  .   Current Medications:   Crestor 20mg  three times weekly  Intolerances:  Lipitor 40 and 80mg  daily, Crestor 40mg  daily  Risk Factors:   hx of MI in 2015 with 2 DES placed  LDL goal: <70  Diet:    Admits to eating out quite a bit, but avoids fried foods; she enjoys cooking, but unable to do much right now as she commutes to [employer] daily for work.   She will be starting a research study BWEL, that looks at health education and whether weight loss decreases risk of breast cancer recurrence.    Exercise:  None recently, but states is starting a 12 week exercise/rehab program at [facility] after January 1  Labs: 09/2016 - TC 156, TG 76, HDL 50, LDL 91 05/2016  -  TC 177, TG 91, HDL 48, LDL 111 10/2015 - TC 189, TG 91, HDL 53, LDL 118  Past Medical History:  Diagnosis Date  . Cancer ([place])   . Coronary artery disease   . Depression   . DVT (deep venous thrombosis) ([place]) 11/15   left femerol artery injury during resusciation.  [name] GERD (gastroesophageal reflux disease)   . HCAP (healthcare-associated pneumonia) 11/22/2014  . Heart murmur   . High cholesterol   . History of blood transfusion 09/2014   related to MI  . History of gout   . Hypertension    . Liver laceration 10/19/14  . Pulmonary edema 10/19/14  . Renal disorder   . Sleep apnea    "suppose to wear mask but I don't" (11/22/2014)  . STEMI (ST elevation myocardial infarction) ([place]) 10/19/14  . Type II diabetes mellitus ([place])     Current Outpatient Prescriptions on File Prior to Visit  Medication Sig Dispense Refill  . acetaminophen (TYLENOL) 500 MG tablet Take 1,000 mg by mouth 2 (two) times daily as needed (pain).     [name] aspirin EC 81 MG tablet Take 1 tablet (81 mg total) by mouth daily.    . clotrimazole (LOTRIMIN) 1 % external solution Apply 1 application topically 2 (two) times daily. In between toes 30 mL 6  . furosemide (LASIX) 40 MG tablet Take 1 tablet (40 mg total) by mouth daily at 12 noon. 30 tablet   . losartan (COZAAR) 25 MG tablet Take 25 mg by mouth daily.    . magnesium oxide (MAG-OX) 400 MG tablet Take 1,200 mg by mouth daily.     . metFORMIN (GLUCOPHAGE-XR) 500 MG 24 hr tablet  Take by mouth daily with breakfast.     . metoprolol succinate (TOPROL XL) 25 MG 24 hr tablet Take 1 tablet (25 mg total) by mouth daily. 30 tablet 8  . nitroGLYCERIN (NITROSTAT) 0.4 MG SL tablet Place 0.4 mg under the tongue every 5 (five) minutes as needed for chest pain. Take every 5 minutes for max of 3 if no relief call 911    . potassium chloride SA (K-DUR,KLOR-CON) 20 MEQ tablet Take 20 mEq by mouth daily.     . rosuvastatin (CRESTOR) 20 MG tablet TAKE 1 TABLET BY MOUTH 3 TIMES PER WEEK 30 tablet 6  . sertraline (ZOLOFT) 50 MG tablet Take 50 mg by mouth daily.    [name] terbinafine (LAMISIL AT) 1 % cream Apply 1 application topically 2 (two) times daily. 42 g 3  . ticagrelor (BRILINTA) 60 MG TABS tablet Take 1 tablet (60 mg total) by mouth 2 (two) times daily. 180 tablet 3   No current facility-administered medications on file prior to visit.     Allergies  Allergen Reactions  . Dilaudid [Hydromorphone Hcl] Anaphylaxis, Itching and Swelling  . Hydromorphone Anaphylaxis, Itching  and Swelling  . Epinephrine Anxiety and Other (See Comments)    Severe anxiety  . Lactose Intolerance (Gi) Diarrhea    Assessment/Plan:  Patient with ASCVD and LDL at 91 on Crestor 20 mg three times weekly.  Discussed option of adding ezetimibe 10 mg daily, however patient will be starting dietary/exercise programs with goal to lose weight and improve diet.  Discussed dietary options for lowering cholesterol and she will work on these for the next 3 months.  Will repeat labs in February and have her back to the office after that for review.    Thank you, Tommy Medal  PharmD CPP  [facility]  11/16/2016 10:14 AM

## 2016-11-25 ENCOUNTER — Ambulatory Visit (HOSPITAL_BASED_OUTPATIENT_CLINIC_OR_DEPARTMENT_OTHER): Payer: BC Managed Care – PPO

## 2016-11-26 ENCOUNTER — Ambulatory Visit (HOSPITAL_BASED_OUTPATIENT_CLINIC_OR_DEPARTMENT_OTHER): Payer: BC Managed Care – PPO | Attending: Cardiovascular Disease | Admitting: Cardiovascular Disease

## 2016-11-26 VITALS — Ht 62.0 in | Wt 185.0 lb

## 2016-11-26 DIAGNOSIS — G4733 Obstructive sleep apnea (adult) (pediatric): Secondary | ICD-10-CM | POA: Diagnosis not present

## 2016-11-26 DIAGNOSIS — G4719 Other hypersomnia: Secondary | ICD-10-CM

## 2016-11-26 DIAGNOSIS — G4730 Sleep apnea, unspecified: Secondary | ICD-10-CM

## 2016-11-29 ENCOUNTER — Encounter (HOSPITAL_BASED_OUTPATIENT_CLINIC_OR_DEPARTMENT_OTHER): Payer: BC Managed Care – PPO

## 2016-12-02 ENCOUNTER — Telehealth: Payer: Self-pay | Admitting: *Deleted

## 2016-12-02 NOTE — Telephone Encounter (Signed)
Information only - patient has cancelled her last 2 scheduled sleep studies Will forward to Dr Oval Linsey so she will be aware

## 2016-12-08 ENCOUNTER — Ambulatory Visit: Payer: BC Managed Care – PPO | Admitting: Sports Medicine

## 2016-12-19 NOTE — Procedures (Signed)
Patient Name: Marie Tanner, Marie Tanner Date: 11/26/2016 Gender: Female D.O.B: [birth year] Age (years): 50 Referring Provider: Skeet Latch Height (inches): 62 Interpreting Physician: Shelva Majestic MD, ABSM Weight (lbs): 185 RPSGT: Laren Everts BMI: 34 MRN: [id number] Neck Size: 14.50  CLINICAL INFORMATION Sleep Study Type: NPSG  Indication for sleep study: Diabetes, Hypertension, Obesity, OSA, Re-Evaluation, Snoring, Witnessed Apneas  Epworth Sleepiness Score: 16  SLEEP STUDY TECHNIQUE As per the AASM Manual for the Scoring of Sleep and Associated Events v2.3 (April 2016) with a hypopnea requiring 4% desaturations.  The channels recorded and monitored were frontal, central and occipital EEG, electrooculogram (EOG), submentalis EMG (chin), nasal and oral airflow, thoracic and abdominal wall motion, anterior tibialis EMG, snore microphone, electrocardiogram, and pulse oximetry.  MEDICATIONS acetaminophen (TYLENOL) 500 MG tablet aspirin EC 81 MG tablet clotrimazole (LOTRIMIN) 1 % external solution furosemide (LASIX) 40 MG tablet losartan (COZAAR) 25 MG tablet magnesium oxide (MAG-OX) 400 MG tablet metFORMIN (GLUCOPHAGE-XR) 500 MG 24 hr tablet metoprolol succinate (TOPROL XL) 25 MG 24 hr tablet nitroGLYCERIN (NITROSTAT) 0.4 MG SL tablet potassium chloride SA (K-DUR,KLOR-CON) 20 MEQ tablet rosuvastatin (CRESTOR) 20 MG tablet sertraline (ZOLOFT) 50 MG tablet terbinafine (LAMISIL AT) 1 % cream ticagrelor (BRILINTA) 60 MG TABS tablet  Medications self-administered by patient taken the night of the study : N/A  SLEEP ARCHITECTURE The study was initiated at 9:47:46 PM and ended at 4:31:15 AM.  Sleep onset time was 27.9 minutes and the sleep efficiency was 71.6%. The total sleep time was 289.0 minutes. Wake after sleep onset (WASO) was 86.6 minutes  Stage REM latency was 72.5 minutes.  The patient spent 15.57% of the night in stage N1 sleep, 65.74% in stage  N2 sleep, 0.00% in stage N3 and 18.69% in REM.  Alpha intrusion was absent.  Supine sleep was 69.38%.  RESPIRATORY PARAMETERS The overall apnea/hypopnea index (AHI) was 3.5 per hour, and respiratory disturbance index (RDI) was 10.2/hour. There were 3 total apneas, including 3 obstructive, 0 central and 0 mixed apneas. There were 14 hypopneas and 32 RERAs.  The AHI during Stage REM sleep was 13.3 per hour.  AHI while supine was 3.3 per hour.  The mean oxygen saturation was 97.21%. The minimum SpO2 during sleep was 84.00%.  Soft snoring was noted during this study.  CARDIAC DATA The 2 lead EKG demonstrated sinus rhythm. The mean heart rate was 73.84 beats per minute. Other EKG findings include: None.  LEG MOVEMENT DATA The total PLMS were 0 with a resulting PLMS index of 0.00. Associated arousal with leg movement index was 0.0 .  IMPRESSIONS - Increase upper airway resistance without significant obstructive sleep apnea overal with AHI 3.5/h, although RDI was 10.2/h.  There was mild to moderate sleep apnea during REM sleep with an AHI of 13.3/h. and RDI 15.1/h. - No significant central sleep apnea occurred during this study (CAI = 0.0/h). - Mild oxygen desaturation to a nadir of 84.00%. - The patient snored with Soft snoring volume. - Reduced sleep efficiency. - The arousal index was abnormal - No cardiac abnormalities were noted during this study. - Clinically significant periodic limb movements did not occur during sleep. No significant associated arousals.  DIAGNOSIS - Obstructive sleep apnea - Excessive Daytime sleepiness  RECOMMENDATIONS - The patient has increased upper airway resistance, and mild to moderate sleep apnea is noted during REM sleep. - Efforts should be made to optimize nasal and oral pharyngeal patency. - Can consider initial alternatives to CPAP therapy for the treatment  of his snoring, and possible dental/ENT evaluation for customized oral appliance. -  Consider reevaluation if increasing symptoms or CPAP titration in this patient with cardiovascular comorbidities. - Avoid alcohol, sedatives and other CNS depressants that may worsen sleep apnea and disrupt normal sleep architecture. - Sleep hygiene should be reviewed to assess factors that may improve sleep quality. - Weight management (BMI 34) and regular exercise should be initiated or continued if appropriate.  [Electronically signed] 12/19/2016 11:13 AM  Shelva Majestic MD, [facility], ABSM Diplomate, American Board of Sleep Medicine   NPI: [id number] [place] PH: [phone]   FX: [phone] [place]

## 2017-01-04 ENCOUNTER — Encounter (HOSPITAL_BASED_OUTPATIENT_CLINIC_OR_DEPARTMENT_OTHER): Payer: BC Managed Care – PPO

## 2017-01-04 ENCOUNTER — Telehealth: Payer: Self-pay | Admitting: *Deleted

## 2017-01-04 NOTE — Telephone Encounter (Signed)
Left message t return a call to discuss sleep study results.

## 2017-01-04 NOTE — Telephone Encounter (Signed)
-----   Message from Troy Sine, MD sent at 12/19/2016 11:21 AM EST ----- Marie Tanner, please discuss results with patient.  Consider initial alternatives to CPAP.  If she continues to be  symptomatic, may need to pursue CPAP titration.

## 2017-01-04 NOTE — Progress Notes (Signed)
Left message to return a call to discuss sleep study results and recommendations. 

## 2017-01-13 ENCOUNTER — Encounter: Payer: Self-pay | Admitting: *Deleted

## 2017-01-13 NOTE — Progress Notes (Signed)
Letter mailed for patient to contact office to discuss sleep study results. Left 2 previous messages w/o any response.

## 2017-01-19 ENCOUNTER — Telehealth: Payer: Self-pay | Admitting: *Deleted

## 2017-01-19 NOTE — Telephone Encounter (Signed)
Patient notified of sleep study results and recommendations. She states that she want to be referred for a oral appliance evaluation. She has used CPAP before and did not like it. Referral done to Dr Oneal Grout.

## 2017-01-19 NOTE — Telephone Encounter (Signed)
-----   Message from Troy Sine, MD sent at 12/19/2016 11:21 AM EST ----- Myrene Buddy, please discuss results with patient.  Consider initial alternatives to CPAP.  If she continues to be  symptomatic, may need to pursue CPAP titration.

## 2017-01-29 ENCOUNTER — Other Ambulatory Visit: Payer: Self-pay | Admitting: Nurse Practitioner

## 2017-01-29 NOTE — Telephone Encounter (Signed)
E-sent to pharmcy

## 2017-02-14 IMAGING — CT CT ANGIO CHEST
1 of 8 series · 17 of 36 positions shown · IV contrast (Iohexol (Omnipaque 350))
Comparison: 11/22/2014

CLINICAL DATA: Sharp intermittent left-sided chest pain radiating
into left arm. Dyspnea.

EXAM:
CT ANGIOGRAPHY CHEST WITH CONTRAST
TECHNIQUE: Multidetector CT imaging of the chest was performed using the
standard protocol during bolus administration of intravenous
contrast. Multiplanar CT image reconstructions and MIPs were
obtained to evaluate the vascular anatomy.
CONTRAST:  80mL OMNIPAQUE IOHEXOL 350 MG/ML SOLN

[Series 407: thins pacs · axial · 0.64mm/px · z∈[+342,+582]mm · 17 of 272 slices shown]
[im 16/272  lung]
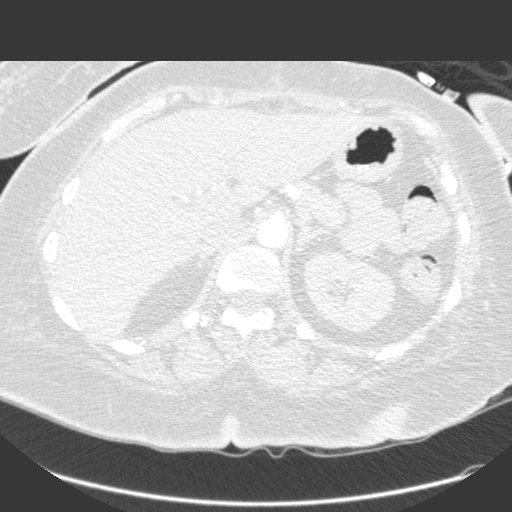
[im 31/272  mediastinal]
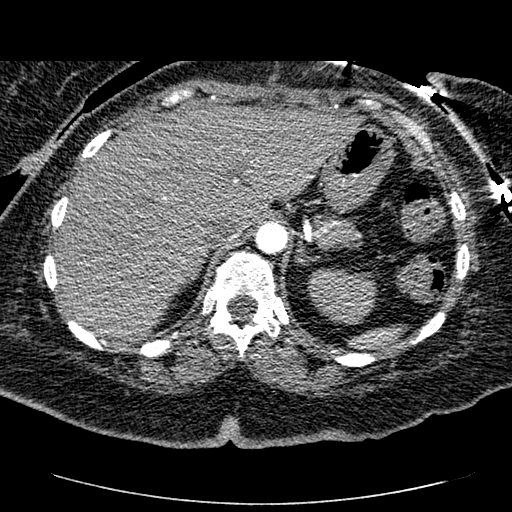
[im 46/272  lung]
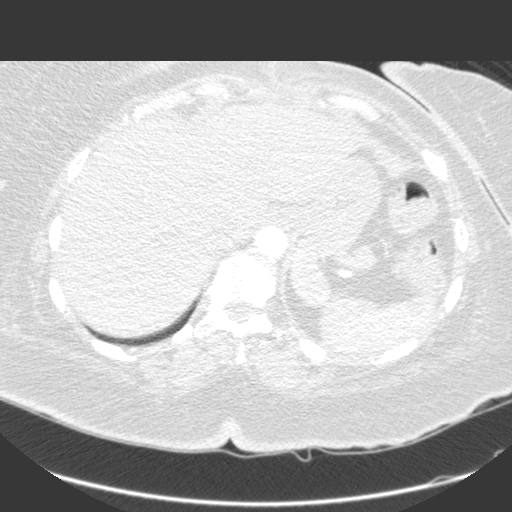
[im 61/272  mediastinal]
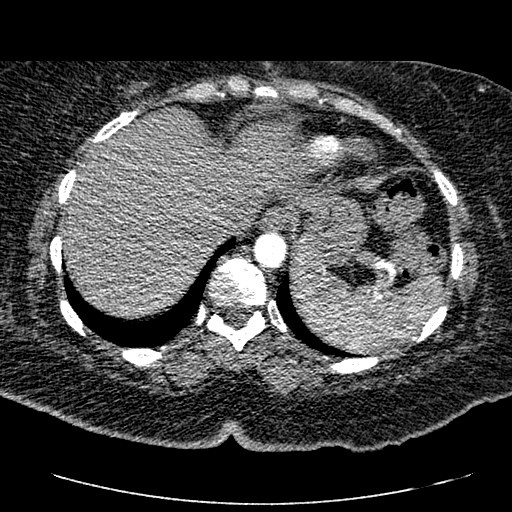
[im 76/272  lung]
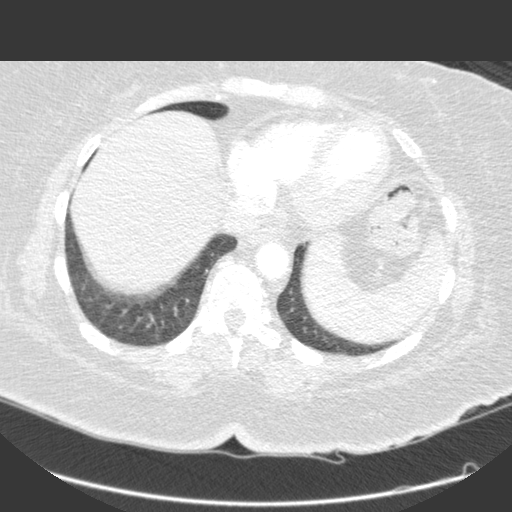
[im 91/272  mediastinal]
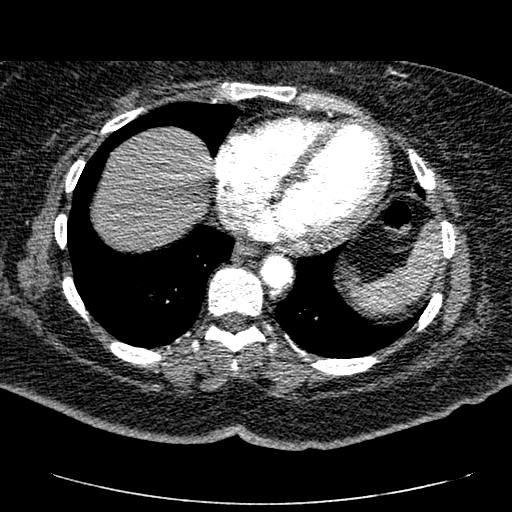
[im 106/272  lung]
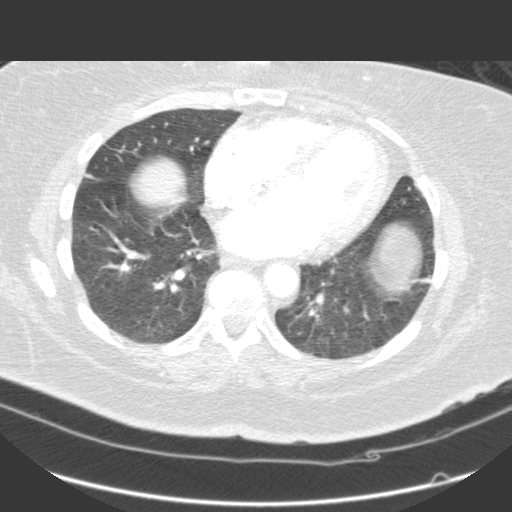
[im 121/272  mediastinal]
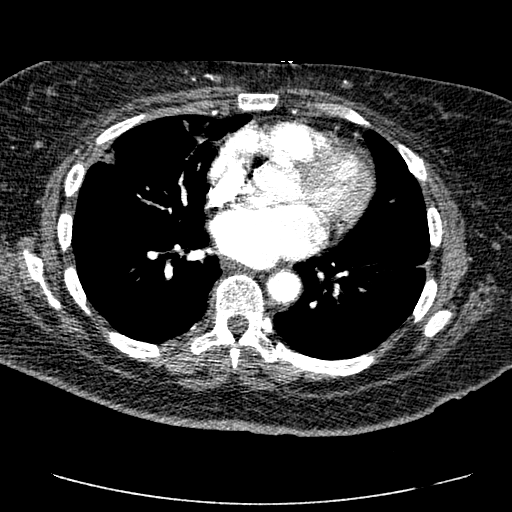
[im 136/272  lung]
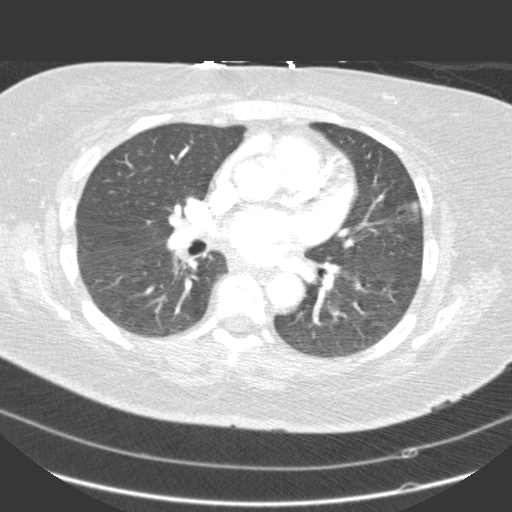
[im 151/272  mediastinal]
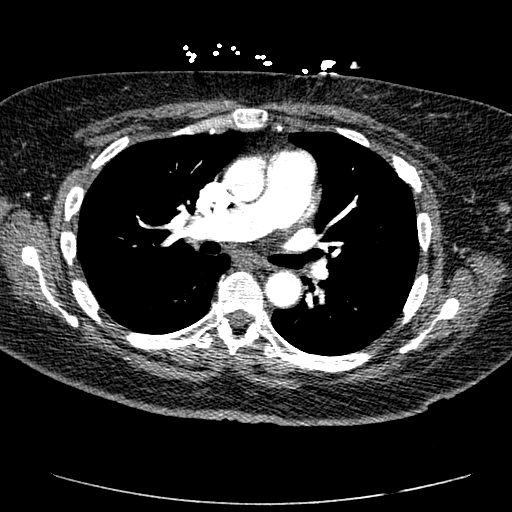
[im 166/272  lung]
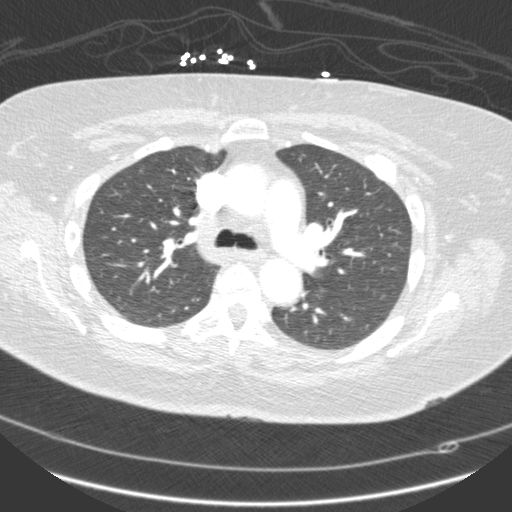
[im 181/272  mediastinal]
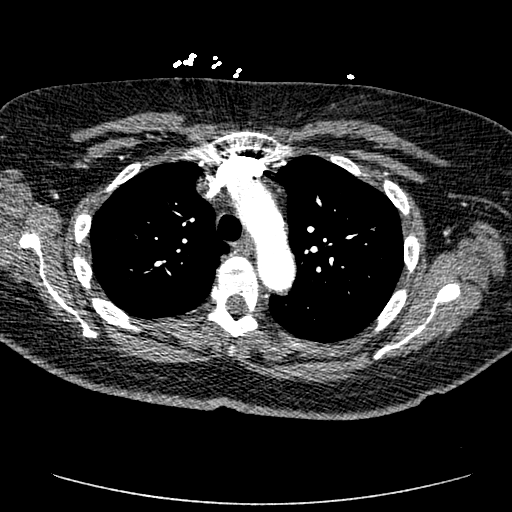
[im 196/272  lung]
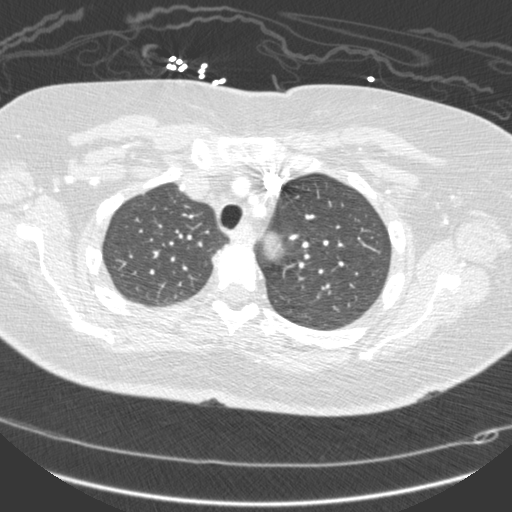
[im 211/272  mediastinal]
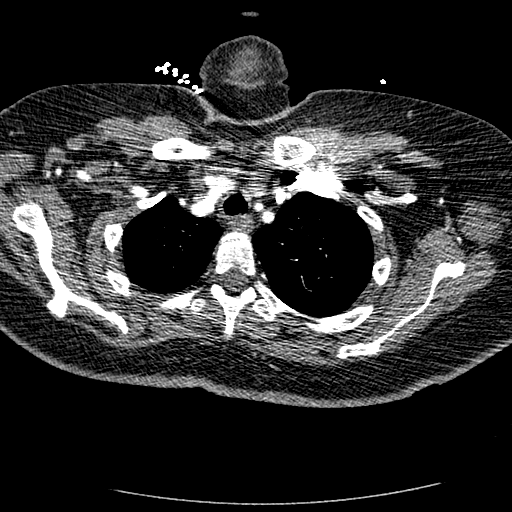
[im 226/272  lung]
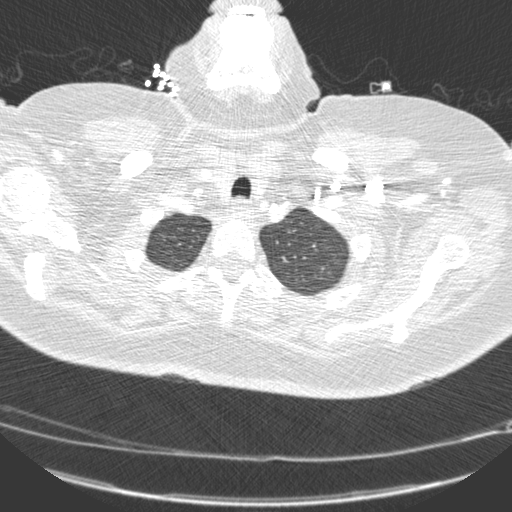
[im 241/272  mediastinal]
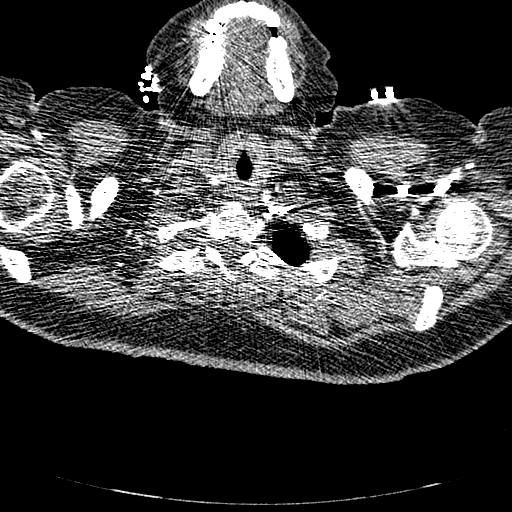
[im 256/272  lung]
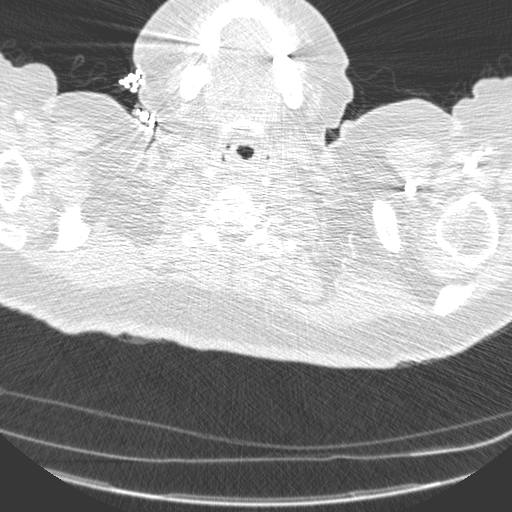

[17 of 36 positions shown; findings below may reference images not displayed]

FINDINGS: Cardiovascular: There is good opacification of the pulmonary
vasculature. There is no pulmonary embolism. The thoracic aorta is
normal in caliber and intact.

Lungs: Mild patchy opacity in the right upper lobe has decreased
from 11/22/2014. There is unchanged linear scarring in both bases.
No new or enlarging parenchymal lung opacities are evident.

Central airways: Patent

Effusions: None

Lymphadenopathy: None

Esophagus: Unremarkable

Upper abdomen: No significant abnormality

Musculoskeletal: No significant abnormality

Review of the MIP images confirms the above findings.
IMPRESSION: Negative for pulmonary embolism. Diminishing ground-glass right
upper lobe opacity. No acute cardiopulmonary findings are evident.

## 2017-03-15 ENCOUNTER — Encounter (HOSPITAL_BASED_OUTPATIENT_CLINIC_OR_DEPARTMENT_OTHER): Payer: Self-pay

## 2017-03-15 ENCOUNTER — Emergency Department (HOSPITAL_BASED_OUTPATIENT_CLINIC_OR_DEPARTMENT_OTHER)
Admission: EM | Admit: 2017-03-15 | Discharge: 2017-03-16 | Disposition: A | Discharge status: Home / Self Care | Payer: BC Managed Care – PPO | Attending: Emergency Medicine | Admitting: Emergency Medicine

## 2017-03-15 DIAGNOSIS — Z7984 Long term (current) use of oral hypoglycemic drugs: Secondary | ICD-10-CM | POA: Diagnosis not present

## 2017-03-15 DIAGNOSIS — Z79899 Other long term (current) drug therapy: Secondary | ICD-10-CM | POA: Insufficient documentation

## 2017-03-15 DIAGNOSIS — Z853 Personal history of malignant neoplasm of breast: Secondary | ICD-10-CM | POA: Insufficient documentation

## 2017-03-15 DIAGNOSIS — R6883 Chills (without fever): Secondary | ICD-10-CM | POA: Diagnosis present

## 2017-03-15 DIAGNOSIS — Z7982 Long term (current) use of aspirin: Secondary | ICD-10-CM | POA: Diagnosis not present

## 2017-03-15 DIAGNOSIS — L0291 Cutaneous abscess, unspecified: Secondary | ICD-10-CM

## 2017-03-15 DIAGNOSIS — I10 Essential (primary) hypertension: Secondary | ICD-10-CM | POA: Diagnosis not present

## 2017-03-15 DIAGNOSIS — I2510 Atherosclerotic heart disease of native coronary artery without angina pectoris: Secondary | ICD-10-CM | POA: Diagnosis not present

## 2017-03-15 DIAGNOSIS — E119 Type 2 diabetes mellitus without complications: Secondary | ICD-10-CM | POA: Diagnosis not present

## 2017-03-15 DIAGNOSIS — L02215 Cutaneous abscess of perineum: Secondary | ICD-10-CM | POA: Insufficient documentation

## 2017-03-15 HISTORY — DX: Malignant neoplasm of unspecified site of unspecified female breast: C50.919

## 2017-03-15 MED ORDER — LIDOCAINE-EPINEPHRINE 2 %-1:100000 IJ SOLN
20.0000 mL | Freq: Once | INTRAMUSCULAR | Status: DC
  Filled 2017-03-15: qty 1

## 2017-03-15 MED ORDER — ACETAMINOPHEN 325 MG PO TABS
650.0000 mg | ORAL_TABLET | Freq: Once | ORAL | Status: AC
  Administered 2017-03-15: 650 mg via ORAL
  Filled 2017-03-15: qty 2

## 2017-03-15 MED ORDER — DOXYCYCLINE HYCLATE 100 MG PO CAPS: 100.0000 mg | ORAL_CAPSULE | Freq: Two times a day (BID) | ORAL | 0 refills | Status: DC

## 2017-03-15 MED ORDER — IBUPROFEN 400 MG PO TABS: 400.0000 mg | ORAL_TABLET | Freq: Once | ORAL | Status: DC | PRN

## 2017-03-15 MED ORDER — HYDROCODONE-ACETAMINOPHEN 5-325 MG PO TABS: 1.0000 | ORAL_TABLET | Freq: Four times a day (QID) | ORAL | 0 refills | Status: DC | PRN

## 2017-03-15 NOTE — ED Provider Notes (Signed)
[facility] DEPT [facility] Provider Note   CSN: [id number] Arrival date & time: 03/15/17  1858   By signing my name below, I, Charolotte Eke, attest that this documentation has been prepared under the direction and in the presence of Janetta Hora, PA-C. Electronically Signed: Charolotte Eke, Scribe. 03/15/17. 10:00 PM.   History   Chief Complaint Chief Complaint  Patient presents with  . Recurrent Skin Infections    HPI Marie Tanner is a 61 y.o. female with h/o breast CA, DVT, GERD, gout, HTN, STEMI, DM, HLD,  who presents to the Emergency Department complaining of several, draining spots on vagina that appeared 1 week ago. Pt states that she has been placing hot compresses on it. Pt reports several other spots on the inside of her vagina that are bothering her as well. Pt associated chills. Pt denies fever. She has had this before and had to have it drained.  The history is provided by the patient. No language interpreter was used.    Past Medical History:  Diagnosis Date  . Breast cancer ([place])   . Cancer ([place])   . Coronary artery disease   . Depression   . DVT (deep venous thrombosis) ([place]) 11/15   left femerol artery injury during resusciation.  [name] GERD (gastroesophageal reflux disease)   . HCAP (healthcare-associated pneumonia) 11/22/2014  . Heart murmur   . High cholesterol   . History of blood transfusion 09/2014   related to MI  . History of gout   . Hypertension   . Liver laceration 10/19/14  . Pulmonary edema 10/19/14  . Renal disorder   . Sleep apnea    "suppose to wear mask but I don't" (11/22/2014)  . STEMI (ST elevation myocardial infarction) ([place]) 10/19/14  . Type II diabetes mellitus [facility])     Patient Active Problem List   Diagnosis Date Noted  . Hyperlipidemia LDL goal <70 11/16/2016  . Malignant neoplasm of female breast ([place]) 01/24/2016  . Abnormal finding on mammography 01/17/2016  . Chest pain 06/06/2015  . Diarrhea 06/06/2015  . Essential  hypertension 06/06/2015  . Depression   . Encounter for therapeutic drug monitoring 02/19/2015  . Mitral regurgitation 01/30/2015  . 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[place]) 01/30/2015  . Left ventricular systolic dysfunction 54/27/0623  . Dyspnea 11/24/2014  . Cardiomyopathy ([place]) 11/24/2014  . DVT (deep venous thrombosis) ([place]) 11/24/2014  . DM type 2 (diabetes mellitus, type 2) ([place]) 11/24/2014  . Deep venous thrombosis of profunda femoris vein ([place]) 11/04/2014  . Controlled type 2 diabetes mellitus without complication ([place]) 76/28/3151  . High sodium levels 10/29/2014  . Elevated WBC count 10/29/2014  . Acute confusion due to medical condition 10/27/2014  . Insomnia due to medical condition 10/27/2014  . Acute respiratory failure ([place]) 10/24/2014  . 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[place]) 10/24/2014  . Common femoral artery injury 10/21/2014  . Injury of kidney 10/20/2014  . Cardiogenic shock ([place]) 10/20/2014  . Laceration of liver 10/20/2014  . ST elevation myocardial infarction (STEMI) ([place]) 10/20/2014  . Diabetes mellitus ([place]) 04/19/2013    Past Surgical History:  Procedure Laterality Date  . ABDOMINAL HYSTERECTOMY  ~ 2000  . APPLICATION OF WOUND VAC  10/2014   "over my naval" (11/22/2014)  . BREAST LUMPECTOMY WITH AXILLARY LYMPH NODE BIOPSY    . BREAST SURGERY    . [place]; 1982  . CORONARY ANGIOPLASTY WITH STENT PLACEMENT  09/2014   "multiple"/notes 11/22/2014  . EXTRACORPOREAL CIRCULATION    .  Femerol artery repair Right 09/2014   pt insists it was a right femoral artery repair on 11/22/2014  . LAPAROSCOPIC ABDOMINAL EXPLORATION    . LEFT VENTRICULAR ASSIST DEVICE     pt is not aware of this hx on 11/22/2014  . Liver laceration repair  09/2014   related to CPR/notes 11/22/2014  . TRACHEOSTOMY  09/2014   "closed on it's own when they took it out"  . TUBAL LIGATION  1982    OB History    Gravida Para Term Preterm AB Living   2 2            SAB TAB Ectopic Multiple Live Births                   Home Medications    Prior to Admission medications   Medication Sig Start Date End Date Taking? Authorizing Provider  letrozole (FEMARA) 2.5 MG tablet Take 2.5 mg by mouth daily.   Yes Historical Provider, MD  loratadine (CLARITIN) 10 MG tablet Take 10 mg by mouth daily.   Yes Historical Provider, MD  acetaminophen (TYLENOL) 500 MG tablet Take 1,000 mg by mouth 2 (two) times daily as needed (pain).     Historical Provider, MD  aspirin EC 81 MG tablet Take 1 tablet (81 mg total) by mouth daily. 04/08/15   Darlin Coco, MD  furosemide (LASIX) 40 MG tablet Take 1 tablet (40 mg total) by mouth daily at 12 noon. 06/10/15   Charlynne Cousins, MD  losartan (COZAAR) 25 MG tablet Take 25 mg by mouth daily.    Historical Provider, MD  magnesium oxide (MAG-OX) 400 MG tablet Take 1,200 mg by mouth daily.     Historical Provider, MD  metFORMIN (GLUCOPHAGE-XR) 500 MG 24 hr tablet Take by mouth daily with breakfast.  01/26/16   Historical Provider, MD  metoprolol succinate (TOPROL-XL) 25 MG 24 hr tablet TAKE 1 TABLET (25 MG TOTAL) BY MOUTH DAILY. 01/29/17   Skeet Latch, MD  nitroGLYCERIN (NITROSTAT) 0.4 MG SL tablet Place 0.4 mg under the tongue every 5 (five) minutes as needed for chest pain. Take every 5 minutes for max of 3 if no relief call 911    Historical Provider, MD  potassium chloride SA (K-DUR,KLOR-CON) 20 MEQ tablet Take 20 mEq by mouth daily.     Historical Provider, MD  rosuvastatin (CRESTOR) 20 MG tablet TAKE 1 TABLET BY MOUTH 3 TIMES PER WEEK 10/16/16   Skeet Latch, MD  ticagrelor (BRILINTA) 60 MG TABS tablet Take 1 tablet (60 mg total) by mouth 2 (two) times daily. 10/05/16   Skeet Latch, MD    Family History Family History  Problem Relation Age of Onset  . Diabetes Mother   . Hypertension Mother   . COPD Mother   . Heart attack Father   . Alzheimer's disease Father   . Diabetes Brother   . Stroke Neg Hx       Social History Social History  Substance Use Topics  . Smoking status: Never Smoker  . Smokeless tobacco: Never Used  . Alcohol use No     Allergies   Dilaudid [hydromorphone hcl]; Hydromorphone; Epinephrine; and Lactose intolerance (gi)   Review of Systems Review of Systems  Constitutional: Positive for chills.  Genitourinary: Positive for vaginal pain.  Skin: Positive for rash.  All other systems reviewed and are negative.   Physical Exam Updated Vital Signs BP 110/70 (BP Location: Left Arm)   Pulse 72   Temp 99.6  F (37.6 C) (Oral)   Resp 18   Ht 5\' 2"  (1.575 m)   Wt 179 lb (81.2 kg)   SpO2 99%   BMI 32.74 kg/m   Physical Exam  Constitutional: She is oriented to person, place, and time. She appears well-developed and well-nourished. No distress.  HENT:  Head: Normocephalic and atraumatic.  Eyes: Conjunctivae are normal. Pupils are equal, round, and reactive to light. Right eye exhibits no discharge. Left eye exhibits no discharge. No scleral icterus.  Neck: Normal range of motion.  Cardiovascular: Normal rate.   Pulmonary/Chest: Effort normal. No respiratory distress.  Abdominal: She exhibits no distension.  Genitourinary:  Genitourinary Comments: 3cm abscess on left side of mons pubis with surrounding induration and cellulitis  Neurological: She is alert and oriented to person, place, and time.  Skin: Skin is warm and dry.  Psychiatric: She has a normal mood and affect. Her behavior is normal.  Nursing note and vitals reviewed.    ED Treatments / Results   DIAGNOSTIC STUDIES: Oxygen Saturation is 99% on room air, normal by my interpretation.    COORDINATION OF CARE: 9:57 PM Discussed treatment plan with pt at bedside and pt agreed to plan, which include [place].    Labs (all labs ordered are listed, but only abnormal results are displayed) Labs Reviewed - No data to display  EKG  EKG Interpretation None       Radiology No results  found.  Procedures Procedures (including critical care time)  INCISION AND DRAINAGE Performed by: Recardo Evangelist Consent: Verbal consent obtained. Risks and benefits: risks, benefits and alternatives were discussed Type: abscess  Body area: left side of mons pubis  Anesthesia: local infiltration  Incision was made with a scalpel.  Local anesthetic: lidocaine 2% with epinephrine  Anesthetic total: 10 ml  Complexity: simple Blunt dissection to break up loculations  Drainage: purulent  Drainage amount: 5cc  Packing material: 1/4 in iodoform gauze  Patient tolerance: Patient tolerated the procedure well with no immediate complications.   Medications Ordered in ED Medications  acetaminophen (TYLENOL) tablet 650 mg (650 mg Oral Given 03/15/17 2200)     Initial Impression / Assessment and Plan / ED Course   I have reviewed the triage vital signs and the nursing notes.  Pertinent labs & imaging results that were available during my care of the patient were reviewed by me and considered in my medical decision making (see chart for details).  23 year presents with a simple abscess confirmed by [place] which is amenable to I&D. I&D performed and patient tolerated well. Patient is afebrile. Because of her multiple comorbidities will place on antibiotics for 5 days. Pain medication given. She has allergy to dilaudid but states she can tolerate hydrocodone. Discussed wound care and signs of infection (fever, chills, increasing pain, redness, or drainage at site). Advised return for wound recheck and possible repacking in 2 days. Return precautions given.  Final Clinical Impressions(s) / ED Diagnoses   Final diagnoses:  Abscess    New Prescriptions New Prescriptions   No medications on file   I personally performed the services described in this documentation, which was scribed in my presence. The recorded information has been reviewed and is accurate.     Recardo Evangelist, PA-C 03/16/17 1841    Veryl Speak, MD 03/16/17 2135

## 2017-03-15 NOTE — ED Triage Notes (Signed)
c/o "boil to the left top of my vagina" x 1 week-NAD-steady gait

## 2017-03-15 NOTE — Discharge Instructions (Signed)
Have packing removed in 2 days Take antibiotics for next 5 days Do not get wet for 24 hours. After that, clean with soap and water

## 2017-03-15 NOTE — ED Notes (Signed)
ED PA at bedside

## 2017-03-17 ENCOUNTER — Encounter (HOSPITAL_BASED_OUTPATIENT_CLINIC_OR_DEPARTMENT_OTHER): Payer: Self-pay

## 2017-03-17 ENCOUNTER — Emergency Department (HOSPITAL_BASED_OUTPATIENT_CLINIC_OR_DEPARTMENT_OTHER)
Admission: EM | Admit: 2017-03-17 | Discharge: 2017-03-17 | Disposition: A | Discharge status: Home / Self Care | Payer: BC Managed Care – PPO | Attending: Emergency Medicine | Admitting: Emergency Medicine

## 2017-03-17 DIAGNOSIS — Z853 Personal history of malignant neoplasm of breast: Secondary | ICD-10-CM | POA: Diagnosis not present

## 2017-03-17 DIAGNOSIS — Z7984 Long term (current) use of oral hypoglycemic drugs: Secondary | ICD-10-CM | POA: Diagnosis not present

## 2017-03-17 DIAGNOSIS — L02214 Cutaneous abscess of groin: Secondary | ICD-10-CM

## 2017-03-17 DIAGNOSIS — Z4801 Encounter for change or removal of surgical wound dressing: Secondary | ICD-10-CM | POA: Diagnosis present

## 2017-03-17 DIAGNOSIS — Z7982 Long term (current) use of aspirin: Secondary | ICD-10-CM | POA: Insufficient documentation

## 2017-03-17 DIAGNOSIS — E119 Type 2 diabetes mellitus without complications: Secondary | ICD-10-CM | POA: Insufficient documentation

## 2017-03-17 DIAGNOSIS — I10 Essential (primary) hypertension: Secondary | ICD-10-CM | POA: Insufficient documentation

## 2017-03-17 DIAGNOSIS — I2510 Atherosclerotic heart disease of native coronary artery without angina pectoris: Secondary | ICD-10-CM | POA: Diagnosis not present

## 2017-03-17 DIAGNOSIS — Z79899 Other long term (current) drug therapy: Secondary | ICD-10-CM | POA: Diagnosis not present

## 2017-03-17 NOTE — Discharge Instructions (Signed)
Watch for increasing swelling was or fevers. Follow-up as needed.

## 2017-03-17 NOTE — ED Triage Notes (Signed)
Pt return for packing removal "top of my vagina"-NAD-steady gait

## 2017-03-17 NOTE — ED Provider Notes (Signed)
[facility] DEPT [facility] Provider Note   CSN: [id number] Arrival date & time: 03/17/17  1528     History   Chief Complaint Chief Complaint  Patient presents with  . Follow-up    HPI Marie Tanner is a 61 y.o. female.  HPI Patient presents for wound recheck and packing removal. 2 days ago seen for abscess of her left mons. Started on antibiotics 2. Had had 4-5 days of infection before that. States that she is feeling better. States his swelling has not decreased a lot but feels better on the inside. No further fevers.  Past Medical History:  Diagnosis Date  . Breast cancer ([place])   . Cancer ([place])   . Coronary artery disease   . Depression   . DVT (deep venous thrombosis) ([place]) 11/15   left femerol artery injury during resusciation.  [name] GERD (gastroesophageal reflux disease)   . HCAP (healthcare-associated pneumonia) 11/22/2014  . Heart murmur   . High cholesterol   . History of blood transfusion 09/2014   related to MI  . History of gout   . Hypertension   . Liver laceration 10/19/14  . Pulmonary edema 10/19/14  . Renal disorder   . Sleep apnea    "suppose to wear mask but I don't" (11/22/2014)  . STEMI (ST elevation myocardial infarction) ([place]) 10/19/14  . Type II diabetes mellitus [facility])     Patient Active Problem List   Diagnosis Date Noted  . Hyperlipidemia LDL goal <70 11/16/2016  . Malignant neoplasm of female breast ([place]) 01/24/2016  . Abnormal finding on mammography 01/17/2016  . Chest pain 06/06/2015  . Diarrhea 06/06/2015  . Essential hypertension 06/06/2015  . Depression   . Encounter for therapeutic drug monitoring 02/19/2015  . Mitral regurgitation 01/30/2015  . 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[place]) 01/30/2015  . Left ventricular systolic dysfunction 38/75/6433  . Dyspnea 11/24/2014  . Cardiomyopathy ([place]) 11/24/2014  . DVT (deep venous thrombosis) ([place]) 11/24/2014  . DM type 2 (diabetes mellitus, type 2) ([place])  11/24/2014  . 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 Controlled type 2 diabetes mellitus without complication ([place]) 29/51/8841  . High sodium levels 10/29/2014  . Elevated WBC count 10/29/2014  . Acute confusion due to medical condition 10/27/2014  . Insomnia due to medical condition 10/27/2014  . Acute respiratory failure ([place]) 10/24/2014  . 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[place]) 10/24/2014  . Common femoral artery injury 10/21/2014  . Injury of kidney 10/20/2014  . Cardiogenic shock ([place]) 10/20/2014  . Laceration of liver 10/20/2014  . ST elevation myocardial infarction (STEMI) ([place]) 10/20/2014  . Diabetes mellitus ([place]) 04/19/2013    Past Surgical History:  Procedure Laterality Date  . ABDOMINAL HYSTERECTOMY  ~ 2000  . APPLICATION OF WOUND VAC  10/2014   "over my naval" (11/22/2014)  . BREAST LUMPECTOMY WITH AXILLARY LYMPH NODE BIOPSY    . BREAST SURGERY    . [place]; 1982  . CORONARY ANGIOPLASTY WITH STENT PLACEMENT  09/2014   "multiple"/notes 11/22/2014  . EXTRACORPOREAL CIRCULATION    . Femerol artery repair Right 09/2014   pt insists it was a right femoral artery repair on 11/22/2014  . LAPAROSCOPIC ABDOMINAL EXPLORATION    . LEFT VENTRICULAR ASSIST DEVICE     pt is not aware of this hx on 11/22/2014  . Liver laceration repair  09/2014   related to CPR/notes 11/22/2014  . TRACHEOSTOMY  09/2014   "closed on it's own when  they took it out"  . TUBAL LIGATION  1982    OB History    Gravida Para Term Preterm AB Living   2 2           SAB TAB Ectopic Multiple Live Births                   Home Medications    Prior to Admission medications   Medication Sig Start Date End Date Taking? Authorizing Provider  acetaminophen (TYLENOL) 500 MG tablet Take 1,000 mg by mouth 2 (two) times daily as needed (pain).     Historical Provider, MD  aspirin EC 81 MG tablet Take 1 tablet (81 mg total) by mouth daily. 04/08/15   Darlin Coco, MD    doxycycline (VIBRAMYCIN) 100 MG capsule Take 1 capsule (100 mg total) by mouth 2 (two) times daily. 03/15/17   Recardo Evangelist, PA-C  furosemide (LASIX) 40 MG tablet Take 1 tablet (40 mg total) by mouth daily at 12 noon. 06/10/15   Charlynne Cousins, MD  HYDROcodone-acetaminophen (NORCO/VICODIN) 5-325 MG tablet Take 1 tablet by mouth every 6 (six) hours as needed for severe pain. 03/15/17   Recardo Evangelist, PA-C  letrozole [facility]) 2.5 MG tablet Take 2.5 mg by mouth daily.    Historical Provider, MD  loratadine (CLARITIN) 10 MG tablet Take 10 mg by mouth daily.    Historical Provider, MD  losartan (COZAAR) 25 MG tablet Take 25 mg by mouth daily.    Historical Provider, MD  magnesium oxide (MAG-OX) 400 MG tablet Take 1,200 mg by mouth daily.     Historical Provider, MD  metFORMIN (GLUCOPHAGE-XR) 500 MG 24 hr tablet Take by mouth daily with breakfast.  01/26/16   Historical Provider, MD  metoprolol succinate (TOPROL-XL) 25 MG 24 hr tablet TAKE 1 TABLET (25 MG TOTAL) BY MOUTH DAILY. 01/29/17   Skeet Latch, MD  nitroGLYCERIN (NITROSTAT) 0.4 MG SL tablet Place 0.4 mg under the tongue every 5 (five) minutes as needed for chest pain. Take every 5 minutes for max of 3 if no relief call 911    Historical Provider, MD  potassium chloride SA (K-DUR,KLOR-CON) 20 MEQ tablet Take 20 mEq by mouth daily.     Historical Provider, MD  rosuvastatin (CRESTOR) 20 MG tablet TAKE 1 TABLET BY MOUTH 3 TIMES PER WEEK 10/16/16   Skeet Latch, MD  ticagrelor (BRILINTA) 60 MG TABS tablet Take 1 tablet (60 mg total) by mouth 2 (two) times daily. 10/05/16   Skeet Latch, MD    Family History Family History  Problem Relation Age of Onset  . Diabetes Mother   . Hypertension Mother   . COPD Mother   . Heart attack Father   . Alzheimer's disease Father   . Diabetes Brother   . Stroke Neg Hx     Social History Social History  Substance Use Topics  . Smoking status: Never Smoker  . Smokeless tobacco: Never  Used  . Alcohol use No     Allergies   Dilaudid [hydromorphone hcl]; Hydromorphone; Epinephrine; and Lactose intolerance (gi)   Review of Systems Review of Systems  Constitutional: Negative for appetite change, chills and fever.  Cardiovascular: Negative for chest pain.  Gastrointestinal: Negative for abdominal pain.  Genitourinary: Negative for flank pain.  Musculoskeletal: Negative for back pain.  Skin: Positive for wound.     Physical Exam Updated Vital Signs BP 121/73 (BP Location: Left Arm)   Pulse 65   Temp 98.3 F (  36.8 C) (Oral)   Resp 18   Ht 5\' 2"  (1.575 m)   Wt 179 lb (81.2 kg)   SpO2 99%   BMI 32.74 kg/m   Physical Exam  Constitutional: She appears well-developed.  Cardiovascular: Normal rate.   Abdominal: There is no tenderness.  Skin: Skin is warm. Capillary refill takes less than 2 seconds.  Around 3 cm indurated area of the left mons. Has central area of incision with packing. Gauze removed. Slight purulence. No fluctuance.     ED Treatments / Results  Labs (all labs ordered are listed, but only abnormal results are displayed) Labs Reviewed - No data to display  EKG  EKG Interpretation None       Radiology No results found.  Procedures Procedures (including critical care time)  Medications Ordered in ED Medications - No data to display   Initial Impression / Assessment and Plan / ED Course  I have reviewed the triage vital signs and the nursing notes.  Pertinent labs & imaging results that were available during my care of the patient were reviewed by me and considered in my medical decision making (see chart for details).     Patient with packing removal. Appears to be healing well but still has 3 years of antibiotics. His somewhat higher risk due to medical comorbidities. Discussed with patient. She does have follow-up on Tuesday with today being Wednesday. Will return sooner if swelling increases or fevers develop.  Final  Clinical Impressions(s) / ED Diagnoses   Final diagnoses:  Abscess of groin, left    New Prescriptions New Prescriptions   No medications on file     Davonna Belling, MD 03/17/17 1607

## 2017-10-27 NOTE — Progress Notes (Signed)
Chief Complaint  Patient presents with  . New Patient (Initial Visit)    establish care  . r wrist pain    x couple weeks, taking tylenol with no relief, pain level 6/10  . Headache    x couple months, intermittent- not daily, no sensitivity to light or sound but does have nausea, pain level 8/10.  Taking tylenol for ha's with no relief    HPI  Right Wrist Pain She is right hand dominant female who is on disability Pt reports that she has been having right wrist pain She states that she thinks she has carpal tunnel that comes and goes In the past to weeks the pain has lingered The pain is like sharp pain with pronation and supination The right wrist swells She reports that she has lymphedema which cause the whole arm to swells She gets numbness of the index finger She previously worked as a Surveyor, mining  Headaches  Pt reports that she has been dealing with headaches for the past couple of months She reports that she takes tylenol for her headaches The headaches are usually in the frontal region of her head Denies aura, photophobia, phonophobia, nausea or vomiting She denies any pattern to her headaches She does not know of anything that alleviates her symptoms She reports that if she relaxes or goes to sleep the headache goes away  Diabetes Mellitus: Patient presents for follow up of diabetes.  Her typical fasting glucose is about 70-85 She reports that her sugars are well controlled and she does not have hypoglycemia She has numbness in her feet bilaterally She has not been to podiatry this year but has been to her podiatrist last year. Her last eye exam was March 08, 2016 She plans to set an appointment this  Year She takes Metformin XR but reports that she is getting diarrhea from the 500mg   She states that she is getting diarrhea more often from the [place]. She has not changed her eating.  Lab Results  Component Value Date   HGBA1C 6.3 (H) 06/06/2015    Other  medical problems was that she MI with cardiogenic shock in 03/08/14 and coded.  She also had breast cancer in year one in remission in right breast.   Depression screen [facility] 2/9 10/28/2017 06/26/2015 03/11/2015  Decreased Interest 0 0 1  Down, Depressed, Hopeless 0 0 0  PHQ - 2 Score 0 0 1    Past Medical History:  Diagnosis Date  . Breast cancer ([place])   . Cancer ([place])   . Coronary artery disease   . Depression   . DVT (deep venous thrombosis) ([place]) 11/15   left femerol artery injury during resusciation.  [name] GERD (gastroesophageal reflux disease)   . HCAP (healthcare-associated pneumonia) 11/22/2014  . Heart murmur   . High cholesterol   . History of blood transfusion 09/2014   related to MI  . History of gout   . Hypertension   . Liver laceration 10/19/14  . Pulmonary edema 10/19/14  . Renal disorder   . Sleep apnea    "suppose to wear mask but I don't" (11/22/2014)  . STEMI (ST elevation myocardial infarction) ([place]) 10/19/14  . Type II diabetes mellitus ([place])     Current Outpatient Medications  Medication Sig Dispense Refill  . acetaminophen (TYLENOL) 500 MG tablet Take 1,000 mg by mouth 2 (two) times daily as needed (pain).     [name] aspirin EC 81 MG tablet Take 1 tablet (81 mg total) by mouth  daily.    . exemestane (AROMASIN) 25 MG tablet Take 25 mg by mouth daily after breakfast.    . furosemide (LASIX) 40 MG tablet Take 1 tablet (40 mg total) by mouth daily at 12 noon. 30 tablet   . HYDROcodone-acetaminophen (NORCO/VICODIN) 5-325 MG tablet Take 1 tablet by mouth every 6 (six) hours as needed for severe pain. 10 tablet 0  . loratadine (CLARITIN) 10 MG tablet Take 10 mg by mouth daily.    [name] losartan (COZAAR) 25 MG tablet Take 25 mg by mouth daily.    . magnesium oxide (MAG-OX) 400 MG tablet Take 1,200 mg by mouth daily.     . metFORMIN (GLUCOPHAGE-XR) 500 MG 24 hr tablet Take by mouth daily with breakfast.     . metoprolol succinate (TOPROL-XL) 25 MG 24 hr tablet TAKE 1 TABLET  (25 MG TOTAL) BY MOUTH DAILY. 30 tablet 11  . nitroGLYCERIN (NITROSTAT) 0.4 MG SL tablet Place 0.4 mg under the tongue every 5 (five) minutes as needed for chest pain. Take every 5 minutes for max of 3 if no relief call 911    . potassium chloride SA (K-DUR,KLOR-CON) 20 MEQ tablet Take 20 mEq by mouth daily.     . rosuvastatin (CRESTOR) 20 MG tablet TAKE 1 TABLET BY MOUTH 3 TIMES PER WEEK 30 tablet 6  . ticagrelor (BRILINTA) 60 MG TABS tablet Take 1 tablet (60 mg total) by mouth 2 (two) times daily. 180 tablet 3  . doxycycline (VIBRAMYCIN) 100 MG capsule Take 1 capsule (100 mg total) by mouth 2 (two) times daily. (Patient not taking: Reported on 10/28/2017) 10 capsule 0  . letrozole (FEMARA) 2.5 MG tablet Take 2.5 mg by mouth daily.    . SUMAtriptan (IMITREX) 25 MG tablet Take one tablet at the onset of headache. May repeat in 2 hours if headache persists or recurs. 10 tablet 1   No current facility-administered medications for this visit.     Allergies:  Allergies  Allergen Reactions  . Dilaudid [Hydromorphone Hcl] Anaphylaxis, Itching and Swelling  . Hydromorphone Anaphylaxis, Itching and Swelling  . Epinephrine Anxiety and Other (See Comments)    Severe anxiety  . Lactose Intolerance (Gi) Diarrhea    Past Surgical History:  Procedure Laterality Date  . ABDOMINAL HYSTERECTOMY  ~ 2000  . APPLICATION OF WOUND VAC  10/2014   "over my naval" (11/22/2014)  . BREAST LUMPECTOMY WITH AXILLARY LYMPH NODE BIOPSY    . BREAST SURGERY    . [place]; 1982  . CORONARY ANGIOPLASTY WITH STENT PLACEMENT  09/2014   "multiple"/notes 11/22/2014  . EXTRACORPOREAL CIRCULATION    . Femerol artery repair Right 09/2014   pt insists it was a right femoral artery repair on 11/22/2014  . LAPAROSCOPIC ABDOMINAL EXPLORATION    . LEFT VENTRICULAR ASSIST DEVICE     pt is not aware of this hx on 11/22/2014  . Liver laceration repair  09/2014   related to CPR/notes 11/22/2014  . TRACHEOSTOMY   09/2014   "closed on it's own when they took it out"  . TUBAL LIGATION  1982    Social History   Socioeconomic History  . Marital status: Married    Spouse name: Not on file  . Number of children: Not on file  . Years of education: Not on file  . Highest education level: Not on file  Social Needs  . Financial resource strain: Not on file  . Food insecurity - worry: Not on file  .  Food insecurity - inability: Not on file  . Transportation needs - medical: Not on file  . Transportation needs - non-medical: Not on file  Occupational History  . Not on file  Tobacco Use  . Smoking status: Never Smoker  . Smokeless tobacco: Never Used  Substance and Sexual Activity  . Alcohol use: No  . Drug use: No  . Sexual activity: Not on file  Other Topics Concern  . Not on file  Social History Narrative  . Not on file    Family History  Problem Relation Age of Onset  . Diabetes Mother   . Hypertension Mother   . COPD Mother   . Heart attack Father   . Alzheimer's disease Father   . Diabetes Brother   . Stroke Neg Hx      Review of Systems  Constitutional: Negative for chills and fever.  Respiratory: Negative for cough, shortness of breath and wheezing.   Cardiovascular: Positive for chest pain and palpitations.       Intermittent chest pain and palpitations off and on  Since 2015. None today.   Gastrointestinal: Negative for abdominal pain, nausea and vomiting.  Genitourinary: Negative for dysuria and urgency.  Musculoskeletal: Positive for joint pain.  Skin: Negative for itching and rash.  Neurological: Positive for headaches. Negative for dizziness, tingling, tremors, seizures and loss of consciousness.  Psychiatric/Behavioral: Negative for depression. The patient is not nervous/anxious.    all others reviewed and negative   Objective: Vitals:   10/28/17 0923  BP: 126/84  Pulse: 83  Resp: 17  Temp: 98.4 F (36.9 C)  SpO2: 97%  Weight: 189 lb 3.2 oz (85.8 kg)    Height: 5\' 2"  (1.575 m)    Physical Exam  Constitutional: She is oriented to person, place, and time. She appears well-developed and well-nourished.  HENT:  Head: Normocephalic and atraumatic.  Eyes: EOM are normal.  Cardiovascular: Normal rate, regular rhythm and intact distal pulses.  Murmur heard. Pulmonary/Chest: Effort normal. No stridor. No respiratory distress. She has no wheezes. She has no rales.  Musculoskeletal: Normal range of motion.  Neurological: She is alert and oriented to person, place, and time. She has normal strength. She is not disoriented. No cranial nerve deficit or sensory deficit. She displays a negative Romberg sign.  Reflex Scores:      Tricep reflexes are 2+ on the right side and 2+ on the left side.      Bicep reflexes are 2+ on the right side and 2+ on the left side.      Brachioradialis reflexes are 2+ on the right side and 2+ on the left side. Right Wrist exam +tinnels + phalens  + tenderness at the anatomic snuff box Sensation intake to pin prick and vibration Normal strength and range of motion Left wrist exam- grossly normal.     07/16/16 a1c of 5.6   Assessment and Plan T[place]ia was seen today for new patient (initial visit), r wrist pain and headache.  Diagnoses and all orders for this visit:  Need for prophylactic vaccination and inoculation against influenza -     Flu Vaccine QUAD 36+ mos IM  Right wrist pain- referral to Hand Surgery -     Ambulatory referral to Orthopedic Surgery  Numbness of right hand- referral to Hand Surgery -     Ambulatory referral to Orthopedic Surgery -     TSH -     Vitamin B12 -     CBC  Controlled type  2 diabetes mellitus with diabetic polyneuropathy, without long-term current use of insulin (HCC) - will check levels today -     Lipid panel -     Hemoglobin A1c  Cardiomyopathy, ischemic- continue with Cardiology for intermittent pain and palpitations -     Lipid panel  Migraine without aura-  continue tylenol, add on prn sumatriptan Advised headache journal -     SUMAtriptan (IMITREX) 25 MG tablet; Take one tablet at the onset of headache. May repeat in 2 hours if headache persists or recurs.  A total of 30 minutes were spent face-to-face with the patient during this encounter and over half of that time was spent on counseling and coordination of care.    [place]

## 2017-10-28 ENCOUNTER — Ambulatory Visit: Payer: BC Managed Care – PPO | Admitting: Family Medicine

## 2017-10-28 ENCOUNTER — Encounter: Payer: Self-pay | Admitting: Family Medicine

## 2017-10-28 VITALS — BP 126/84 | HR 83 | Temp 98.4°F | Resp 17 | Ht 62.0 in | Wt 189.2 lb

## 2017-10-28 DIAGNOSIS — G43009 Migraine without aura, not intractable, without status migrainosus: Secondary | ICD-10-CM | POA: Diagnosis not present

## 2017-10-28 DIAGNOSIS — I255 Ischemic cardiomyopathy: Secondary | ICD-10-CM

## 2017-10-28 DIAGNOSIS — Z23 Encounter for immunization: Secondary | ICD-10-CM | POA: Diagnosis not present

## 2017-10-28 DIAGNOSIS — R200 Anesthesia of skin: Secondary | ICD-10-CM | POA: Diagnosis not present

## 2017-10-28 DIAGNOSIS — E1142 Type 2 diabetes mellitus with diabetic polyneuropathy: Secondary | ICD-10-CM | POA: Diagnosis not present

## 2017-10-28 DIAGNOSIS — M25531 Pain in right wrist: Secondary | ICD-10-CM

## 2017-10-28 MED ORDER — SUMATRIPTAN SUCCINATE 25 MG PO TABS: ORAL_TABLET | ORAL | 1 refills | Status: DC

## 2017-10-28 NOTE — Patient Instructions (Signed)
     IF you received an x-ray today, you will receive an invoice from [facility]. Please contact [facility] at [phone] with questions or concerns regarding your invoice.   IF you received labwork today, you will receive an invoice from [facility]. Please contact [facility] at [phone] with questions or concerns regarding your invoice.   Our billing staff will not be able to assist you with questions regarding bills from these companies.  You will be contacted with the lab results as soon as they are available. The fastest way to get your results is to activate your My Chart account. Instructions are located on the last page of this paperwork. If you have not heard from us regarding the results in 2 weeks, please contact this office.     

## 2017-10-29 LAB — CBC
HEMOGLOBIN: 12.4 g/dL (ref 11.1–15.9)
Hematocrit: 37.7 % (ref 34.0–46.6)
MCH: 27.4 pg (ref 26.6–33.0)
MCHC: 32.9 g/dL (ref 31.5–35.7)
MCV: 83 fL (ref 79–97)
Platelets: 267 10*3/uL (ref 150–379)
RBC: 4.52 x10E6/uL (ref 3.77–5.28)
RDW: 15 % (ref 12.3–15.4)
WBC: 3.5 10*3/uL (ref 3.4–10.8)

## 2017-10-29 LAB — HEMOGLOBIN A1C
ESTIMATED AVERAGE GLUCOSE: 134 mg/dL
HEMOGLOBIN A1C: 6.3 % — AB (ref 4.8–5.6)

## 2017-10-29 LAB — LIPID PANEL
CHOL/HDL RATIO: 3.4 ratio (ref 0.0–4.4)
CHOLESTEROL TOTAL: 195 mg/dL (ref 100–199)
HDL: 58 mg/dL (ref >39)
LDL CALC: 124 mg/dL — AB (ref 0–99)
Triglycerides: 66 mg/dL (ref 0–149)
VLDL CHOLESTEROL CAL: 13 mg/dL (ref 5–40)

## 2017-10-29 LAB — TSH: TSH: 2.56 u[IU]/mL (ref 0.450–4.500)

## 2017-10-29 LAB — VITAMIN B12: Vitamin B-12: 442 pg/mL (ref 232–1245)

## 2017-11-15 ENCOUNTER — Other Ambulatory Visit (INDEPENDENT_AMBULATORY_CARE_PROVIDER_SITE_OTHER): Payer: Self-pay

## 2017-11-15 ENCOUNTER — Encounter (INDEPENDENT_AMBULATORY_CARE_PROVIDER_SITE_OTHER): Payer: Self-pay | Admitting: Orthopaedic Surgery

## 2017-11-15 ENCOUNTER — Ambulatory Visit (INDEPENDENT_AMBULATORY_CARE_PROVIDER_SITE_OTHER): Payer: BC Managed Care – PPO

## 2017-11-15 ENCOUNTER — Ambulatory Visit (INDEPENDENT_AMBULATORY_CARE_PROVIDER_SITE_OTHER): Payer: BC Managed Care – PPO | Admitting: Orthopaedic Surgery

## 2017-11-15 VITALS — Resp 12 | Ht 61.0 in | Wt 200.0 lb

## 2017-11-15 DIAGNOSIS — M25531 Pain in right wrist: Secondary | ICD-10-CM | POA: Diagnosis not present

## 2017-11-15 MED ORDER — DICLOFENAC SODIUM 1 % TD GEL: 2.0000 g | Freq: Four times a day (QID) | TRANSDERMAL | 3 refills | Status: DC

## 2017-11-15 NOTE — Progress Notes (Signed)
Office Visit Note   Patient: Marie Tanner           Date of Birth: 11-Apr-1956           MRN: [id number] Visit Date: 11/15/2017              Requested by: Forrest Moron, MD [place], [place] [id number] PCP: Forrest Moron, MD   Assessment & Plan: Visit Diagnoses:  1. Pain in right wrist     Plan: Right wrist pain appears to be soft tissue in nature. X-rays essentially negative around the distal ulna where the patient is symptomatic. Full wrist splint and Voltaren gel. Return in 3-4 weeks if no improvement  Follow-Up Instructions: Return in about 1 month (around 12/16/2017).   Orders:  Orders Placed This Encounter  Procedures  . XR Wrist Complete Right   No orders of the defined types were placed in this encounter.     Procedures: No procedures performed   Clinical Data: No additional findings.   Subjective: Chief Complaint  Patient presents with  . Right Wrist - Pain, Numbness  Insidious onset of right wrist pain approximately 3 months ago without injury or trauma. Has chronic lymphedema based on her breast surgery. Also has history of carpal tunnel syndrome that is presently inactive. It is localized along the distal ulna without numbness or tingling and is episodic in nature.  HPI  Review of Systems  Constitutional: Negative for chills, fatigue and fever.  Eyes: Negative for itching.  Respiratory: Positive for shortness of breath. Negative for chest tightness.   Cardiovascular: Negative for chest pain, palpitations and leg swelling.  Gastrointestinal: Negative for blood in stool, constipation and diarrhea.  Endocrine: Negative for polyuria.  Genitourinary: Negative for dysuria.  Musculoskeletal: Positive for joint swelling, neck pain and neck stiffness. Negative for back pain.  Allergic/Immunologic: Negative for immunocompromised state.  Neurological: Positive for light-headedness. Negative for dizziness and numbness.  Hematological: Does not  bruise/bleed easily.  Psychiatric/Behavioral: Positive for sleep disturbance. The patient is not nervous/anxious.      Objective: Vital Signs: Resp 12   Ht 5\' 1"  (1.549 m)   Wt 200 lb (90.7 kg)   BMI 37.79 kg/m   Physical Exam  Ortho Exam awake alert and oriented 3 comfortable sitting. Mild edema in the dorsum of the right wrist consistent with her lymphedema. No Tinel's or Phalen's about the right wrist. Full range of motion of fingers. No pain over the first dorsal extensor compartment. No pain over the TFCC or the scapholunate ligament. Some mild discomfort at the base of the thumb with a minimally positive grind test but not significant. A little bit of tenderness over the distal ulna but no masses. No pain with radial ulnar deviation. Good grip and good release  Specialty Comments:  No specialty comments available.  Imaging: Xr Wrist Complete Right  Result Date: 11/15/2017 Films of the right wrist obtained in 3 projections. There are degenerative changes at the base of the thumb at the metacarpal carpal joint GERD is slight subluxation and subchondral sclerosis consistent with moderate osteoarthritis. There is some widening between the scaphoid and the lunate patient is asymptomatic in that area. No evidence of fracture or no ectopic calcification in the tickly about the distal ulna where patient is symptomatic    PMFS History: Patient Active Problem List   Diagnosis Date Noted  . Hyperlipidemia LDL goal <70 11/16/2016  . Malignant neoplasm of female breast ([place]) 01/24/2016  . Abnormal  finding on mammography 01/17/2016  . Chest pain 06/06/2015  . Diarrhea 06/06/2015  . Essential hypertension 06/06/2015  . Depression   . Encounter for therapeutic drug monitoring 02/19/2015  . Mitral regurgitation 01/30/2015  . 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[place]) 01/30/2015  . Left ventricular systolic dysfunction 33/00/7622  . Dyspnea 11/24/2014  .  Cardiomyopathy ([place]) 11/24/2014  . DVT (deep venous thrombosis) ([place]) 11/24/2014  . DM type 2 (diabetes mellitus, type 2) ([place]) 11/24/2014  . 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 Controlled type 2 diabetes mellitus without complication ([place]) 63/33/5456  . High sodium levels 10/29/2014  . Elevated WBC count 10/29/2014  . Acute confusion due to medical condition 10/27/2014  . Insomnia due to medical condition 10/27/2014  . Acute respiratory failure ([place]) 10/24/2014  . 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[place]) 10/24/2014  . Common femoral artery injury 10/21/2014  . Injury of kidney 10/20/2014  . Cardiogenic shock ([place]) 10/20/2014  . Laceration of liver 10/20/2014  . ST elevation myocardial infarction (STEMI) ([place]) 10/20/2014  . Diabetes mellitus ([place]) 04/19/2013   Past Medical History:  Diagnosis Date  . Breast cancer ([place])   . Cancer ([place])   . Coronary artery disease   . Depression   . DVT (deep venous thrombosis) ([place]) 11/15   left femerol artery injury during resusciation.  [name] GERD (gastroesophageal reflux disease)   . HCAP (healthcare-associated pneumonia) 11/22/2014  . Heart murmur   . High cholesterol   . History of blood transfusion 09/2014   related to MI  . History of gout   . Hypertension   . Liver laceration 10/19/14  . Pulmonary edema 10/19/14  . Renal disorder   . Sleep apnea    "suppose to wear mask but I don't" (11/22/2014)  . STEMI (ST elevation myocardial infarction) ([place]) 10/19/14  . Type II diabetes mellitus ([facility])     Family History  Problem Relation Age of Onset  . Diabetes Mother   . Hypertension Mother   . COPD Mother   . Heart attack Father   . Alzheimer's disease Father   . Diabetes Brother   . Stroke Neg Hx     Past Surgical History:  Procedure Laterality Date  . ABDOMINAL HYSTERECTOMY  ~ 2000  . APPLICATION OF WOUND VAC  10/2014   "over my naval" (11/22/2014)  . BREAST LUMPECTOMY WITH AXILLARY LYMPH NODE BIOPSY    .  BREAST SURGERY    . [place]; 1982  . CORONARY ANGIOPLASTY WITH STENT PLACEMENT  09/2014   "multiple"/notes 11/22/2014  . EXTRACORPOREAL CIRCULATION    . Femerol artery repair Right 09/2014   pt insists it was a right femoral artery repair on 11/22/2014  . LAPAROSCOPIC ABDOMINAL EXPLORATION    . LEFT VENTRICULAR ASSIST DEVICE     pt is not aware of this hx on 11/22/2014  . Liver laceration repair  09/2014   related to CPR/notes 11/22/2014  . TRACHEOSTOMY  09/2014   "closed on it's own when they took it out"  . TUBAL LIGATION  1982   Social History   Occupational History  . Not on file  Tobacco Use  . Smoking status: Never Smoker  . Smokeless tobacco: Never Used  Substance and Sexual Activity  . Alcohol use: No  . Drug use: No  . Sexual activity: Not on file

## 2017-11-16 ENCOUNTER — Ambulatory Visit (INDEPENDENT_AMBULATORY_CARE_PROVIDER_SITE_OTHER): Payer: BC Managed Care – PPO | Admitting: Cardiovascular Disease

## 2017-11-16 ENCOUNTER — Encounter: Payer: Self-pay | Admitting: Cardiovascular Disease

## 2017-11-16 VITALS — BP 122/82 | HR 87 | Ht 62.0 in | Wt 192.0 lb

## 2017-11-16 DIAGNOSIS — I110 Hypertensive heart disease with heart failure: Secondary | ICD-10-CM | POA: Diagnosis not present

## 2017-11-16 DIAGNOSIS — R079 Chest pain, unspecified: Secondary | ICD-10-CM

## 2017-11-16 DIAGNOSIS — E7800 Pure hypercholesterolemia, unspecified: Secondary | ICD-10-CM | POA: Diagnosis not present

## 2017-11-16 DIAGNOSIS — G4733 Obstructive sleep apnea (adult) (pediatric): Secondary | ICD-10-CM | POA: Diagnosis not present

## 2017-11-16 DIAGNOSIS — Z955 Presence of coronary angioplasty implant and graft: Secondary | ICD-10-CM | POA: Diagnosis not present

## 2017-11-16 DIAGNOSIS — I5032 Chronic diastolic (congestive) heart failure: Secondary | ICD-10-CM | POA: Diagnosis not present

## 2017-11-16 LAB — TROPONIN I: Troponin I: 0.01 ng/mL (ref 0.00–0.04)

## 2017-11-16 MED ORDER — NITROGLYCERIN 0.4 MG SL SUBL: 0.4000 mg | SUBLINGUAL_TABLET | SUBLINGUAL | 99 refills | Status: DC | PRN

## 2017-11-16 NOTE — Progress Notes (Signed)
Cardiology Office Note   Date:  11/16/2017   ID:  Marie Tanner, DOB 1956-06-09, MRN [id number]  PCP:  Forrest Moron, MD  Cardiologist:   Skeet Latch, MD  Oncologist: Samuel Bouche, MD Radiation Oncologist: Ladona Horns, MD Surgical Oncologist: Nancie Neas, MD  No chief complaint on file.   History of Present Illness: Marie Tanner is a 61 y.o. female with hypertension, chronic diastolic heart failure, OSA not on CPAP, diabetes, prior DVT, breast cancer, and mitral regurgitation who presents for follow up.  Marie Tanner was previously a patient of [name].  In 2015 she had a STEMI and cardiac arrest. She was cared for at [facility]. She ultimately received 2 DES (LM/LAD and LCx).  She had a prolonged hospitalization requiring tracheostomy.  Her liver was lacerated during CPR.  She required surgical repair after femoral catheterization.  She also developed a DVT in the hospital.  One week after discharge she developed pneumonia and was admitted at [facility].  She has been seen in the hospital twice for chest pain and ruled out for MI.  She had a negative Myoview 05/2015.  She also had negative CT scans for PE.  Marie Tanner also underwent surgery for breast cancer on 02/21/16.  She completed her radiation in September 2017.  At her last appointment she was referred for sleep study which was abnormal.  However, she declined using the CPAP device and said that she would go have a fitting for an oral airway.  However she has not done this.  Marie Tanner had an echo 06/2016 that showed LVEF 50-55% with grade 1 diastolic dysfunction.    Marie Tanner had been feeling generally well until three days ago.  The episode occurred while driving.  She developed sharp, 10 out of 10 left-sided chest pain.  It lasted 10-15 minutes and was so intense that she had to pull her car to the side.  It was associated with shortness of breath and nausea but no diaphoresis.  She did feel as though  her hands were sweaty.  She pulled her car to the side, got out, walked around, and it subsided.  Since then she has not had any episodes as intense but continues to feel strange.  She had mild chest discomfort.  Each episode occurred at rest.  One occurred while lying in the bed.  At baseline she exercises at the gym 3 days/week.  She does cardio, walks on the treadmill, and does strength training.  She has no exertional symptoms.  She has not noted any lower extremity edema.  She does sometimes wake up gasping for air.  She struggles with insomnia.  She is able to fall asleep but wakes up frequently both to shortness of breath, and need to urinate, and sometimes she is unsure why.  In the past she had acid reflux but does not think she is experienced this lately.  She tries to go to bed at the same time and does not have her television on in the room.  She does not drink much caffeine.   Past Medical History:  Diagnosis Date  . Breast cancer ([place])   . Cancer ([place])   . Coronary artery disease   . Depression   . DVT (deep venous thrombosis) ([place]) 11/15   left femerol artery injury during resusciation.  [name] GERD (gastroesophageal reflux disease)   . HCAP (healthcare-associated pneumonia) 11/22/2014  . Heart murmur   . High cholesterol   . History  of blood transfusion 09/2014   related to MI  . History of gout   . Hypertension   . Liver laceration 10/19/14  . Pulmonary edema 10/19/14  . Renal disorder   . Sleep apnea    "suppose to wear mask but I don't" (11/22/2014)  . STEMI (ST elevation myocardial infarction) ([place]) 10/19/14  . Type II diabetes mellitus ([place])     Past Surgical History:  Procedure Laterality Date  . ABDOMINAL HYSTERECTOMY  ~ 2000  . APPLICATION OF WOUND VAC  10/2014   "over my naval" (11/22/2014)  . BREAST LUMPECTOMY WITH AXILLARY LYMPH NODE BIOPSY    . BREAST SURGERY    . [place]; 1982  . CORONARY ANGIOPLASTY WITH STENT PLACEMENT  09/2014    "multiple"/notes 11/22/2014  . EXTRACORPOREAL CIRCULATION    . Femerol artery repair Right 09/2014   pt insists it was a right femoral artery repair on 11/22/2014  . LAPAROSCOPIC ABDOMINAL EXPLORATION    . LEFT VENTRICULAR ASSIST DEVICE     pt is not aware of this hx on 11/22/2014  . Liver laceration repair  09/2014   related to CPR/notes 11/22/2014  . TRACHEOSTOMY  09/2014   "closed on it's own when they took it out"  . TUBAL LIGATION  1982     Current Outpatient Medications  Medication Sig Dispense Refill  . acetaminophen (TYLENOL) 500 MG tablet Take 1,000 mg by mouth 2 (two) times daily as needed (pain).     [name] aspirin EC 81 MG tablet Take 1 tablet (81 mg total) by mouth daily.    . diclofenac sodium (VOLTAREN) 1 % GEL Apply 2 g topically 4 (four) times daily. 1 Tube 3  . exemestane (AROMASIN) 25 MG tablet Take 25 mg by mouth daily after breakfast.    . furosemide (LASIX) 40 MG tablet Take 1 tablet (40 mg total) by mouth daily at 12 noon. 30 tablet   . HYDROcodone-acetaminophen (NORCO/VICODIN) 5-325 MG tablet Take 1 tablet by mouth every 6 (six) hours as needed for severe pain. 10 tablet 0  . loratadine (CLARITIN) 10 MG tablet Take 10 mg by mouth daily.    [name] losartan (COZAAR) 25 MG tablet Take 25 mg by mouth daily.    . magnesium oxide (MAG-OX) 400 MG tablet Take 1,200 mg by mouth daily.     . metFORMIN (GLUCOPHAGE-XR) 500 MG 24 hr tablet Take by mouth daily with breakfast.     . metoprolol succinate (TOPROL-XL) 25 MG 24 hr tablet TAKE 1 TABLET (25 MG TOTAL) BY MOUTH DAILY. 30 tablet 11  . nitroGLYCERIN (NITROSTAT) 0.4 MG SL tablet Place 1 tablet (0.4 mg total) under the tongue every 5 (five) minutes as needed for chest pain. max of 3 if no relief call 911 25 tablet PRN  . potassium chloride SA (K-DUR,KLOR-CON) 20 MEQ tablet Take 20 mEq by mouth daily.     . rosuvastatin (CRESTOR) 20 MG tablet TAKE 1 TABLET BY MOUTH 3 TIMES PER WEEK 30 tablet 6  . SUMAtriptan (IMITREX) 25 MG tablet  Take one tablet at the onset of headache. May repeat in 2 hours if headache persists or recurs. 10 tablet 1  . ticagrelor (BRILINTA) 60 MG TABS tablet Take 1 tablet (60 mg total) by mouth 2 (two) times daily. 180 tablet 3   No current facility-administered medications for this visit.     Allergies:   Dilaudid [hydromorphone hcl]; Hydromorphone; Epinephrine; and Lactose intolerance (gi)    Social History:  The patient  reports that  has never smoked. she has never used smokeless tobacco. She reports that she does not drink alcohol or use drugs.   Family History:  The patient's family history includes Alzheimer's disease in her father; COPD in her mother; Diabetes in her brother and mother; Heart attack in her father; Hypertension in her mother.    ROS:  Please see the history of present illness.   Otherwise, review of systems are positive for neck pain.   All other systems are reviewed and negative.    PHYSICAL EXAM: VS:  BP 122/82   Pulse 87   Ht 5\' 2"  (1.575 m)   Wt 192 lb (87.1 kg)   BMI 35.12 kg/m  , BMI Body mass index is 35.12 kg/m. GENERAL:  Well appearing HEENT: Pupils equal round and reactive, fundi not visualized, oral mucosa unremarkable NECK:  No jugular venous distention, waveform within normal limits, carotid upstroke brisk and symmetric, no bruits, no thyromegaly LUNGS:  Clear to auscultation bilaterally HEART:  RRR.  PMI not displaced or sustained,S1 and S2 within normal limits, no S3, no S4, no clicks, no rubs, no murmurs ABD:  Flat, positive bowel sounds normal in frequency in pitch, no bruits, no rebound, no guarding, no midline pulsatile mass, no hepatomegaly, no splenomegaly EXT:  2 plus pulses throughout, no edema, no cyanosis no clubbing SKIN:  No rashes no nodules NEURO:  Cranial nerves II through XII grossly intact, motor grossly intact throughout PSYCH:  Cognitively intact, oriented to person place and time   EKG:  EKG is ordered today. 02/11/16: Sinus  arrhythmia.  Rate 71 bpm. Non-specific ST-T changes. 10/05/16: Sinus rhythm. Rate 85 bpm. Prior inferior infarct. 11/16/17: Sinus rhythm.  Rate 87 bpm.  Prior inferior infarct.    Echo 07/01/16: Study Conclusions  - Left ventricle: The cavity size was normal. Systolic function was   normal. The estimated ejection fraction was in the range of 50%   to 55%. Wall motion was normal; there were no regional wall   motion abnormalities. Doppler parameters are consistent with   abnormal left ventricular relaxation (grade 1 diastolic   dysfunction). Doppler parameters are consistent with   indeterminate ventricular filling pressure. - Aortic valve: Transvalvular velocity was within the normal range.   There was no stenosis. There was no regurgitation. - Mitral valve: There was mild regurgitation. - Left atrium: The atrium was mildly dilated. - Right ventricle: The cavity size was normal. Wall thickness was   normal. Systolic function was normal. - Atrial septum: No defect or patent foramen ovale was identified   by color flow Doppler. - Tricuspid valve: There was mild regurgitation. - Pulmonary arteries: Systolic pressure was within the normal   range. PA peak pressure: 23 mm Hg (S).  Recent Labs: 10/28/2017: Hemoglobin 12.4; Platelets 267; TSH 2.560    Lipid Panel    Component Value Date/Time   CHOL 195 10/28/2017 1053   TRIG 66 10/28/2017 1053   HDL 58 10/28/2017 1053   CHOLHDL 3.4 10/28/2017 1053   CHOLHDL 3.1 10/05/2016 0937   VLDL 15 10/05/2016 0937   LDLCALC 124 (H) 10/28/2017 1053      Wt Readings from Last 3 Encounters:  11/16/17 192 lb (87.1 kg)  11/15/17 200 lb (90.7 kg)  10/28/17 189 lb 3.2 oz (85.8 kg)      ASSESSMENT AND PLAN:  # CAD s/p STEMI: # Atypical chest pain:  Marie Tanner had ST elevation myocardial infarction with stents placed  in the LAD/left main and left circumflex arteries.  She is on  low dose ticagrelor indefinitely.  Her current chest  pain is not exertional and doesn't seem to be attributable to ischemia.  We will get an exercise Myoview.  Recommend that she start treatment for her known sleep apnea.  It could also be due to GERD.  Continue aspirin, metoprolol and rosuvastatin.  Check troponin today.    # Chronic diastolic heart failure: Marie Tanner is euvolemic.  Her weight is stable.  I think her shortness of breath lying down is more related to untreated sleep apnea than heart failure.  Continue furosemide and BP management.  # Hypertensive heart disease: BP well-controlled on metoprolol and losartan.   # Hyperlipidemia:  Rosuvastatin was reduced due to muscle cramping.  She still has mild cramping but her LDL is not ag goal. She did not tolerate atorvastatin either.  She will see our pharmacist about PCSK9 inhibitor.   # OSA: She needs to wear her CPAP or get an oral device.     Current medicines are reviewed at length with the patient today.  The patient does not have concerns regarding medicines.  The following changes have been made: none  Labs/ tests ordered today include:   Orders Placed This Encounter  Procedures  . Troponin I  . MYOCARDIAL PERFUSION IMAGING  . EKG 12-Lead     Disposition:   FU with Hasina Kreager C. Oval Linsey, MD, [facility] in 3 months.  APP in 2 weeks.   This note was written with the assistance of speech recognition software.  Please excuse any transcriptional errors.  Signed, Batya Citron C. Oval Linsey, MD, [facility]  11/16/2017 12:31 PM    [place]

## 2017-11-16 NOTE — Patient Instructions (Addendum)
Medication Instructions:  Your physician recommends that you continue on your current medications as directed. Please refer to the Current Medication list given to you today.  Labwork: TROPONIN LEVEL TODAY   Testing/Procedures: Your physician has requested that you have en exercise stress myoview. For further information please visit [email or web address]. Please follow instruction sheet, as given..  Follow-Up: Your physician recommends that you schedule a follow-up appointment in: [place]  Your physician recommends that you schedule a follow-up appointment in: 2 WEEKS WITH DR [facility] OR PA  Any Other Special Instructions Will Be Listed Below (If Applicable). YOU NEED TO THINK ABOUT AND DECIDE IF YOU WANT TO CPAP OR ORAL DEVICE FOR YOUR SLEEP APNEA  If you need a refill on your cardiac medications before your next appointment, please call your pharmacy.

## 2017-11-18 ENCOUNTER — Telehealth (HOSPITAL_COMMUNITY): Payer: Self-pay

## 2017-11-18 NOTE — Telephone Encounter (Signed)
Encounter complete. 

## 2017-11-24 ENCOUNTER — Ambulatory Visit (HOSPITAL_COMMUNITY)
Admission: RE | Admit: 2017-11-24 | Discharge: 2017-11-24 | Disposition: A | Discharge status: Home / Self Care | Payer: BC Managed Care – PPO | Source: Ambulatory Visit | Attending: Cardiovascular Disease | Admitting: Cardiovascular Disease

## 2017-11-24 DIAGNOSIS — Z6835 Body mass index (BMI) 35.0-35.9, adult: Secondary | ICD-10-CM | POA: Diagnosis not present

## 2017-11-24 DIAGNOSIS — G4733 Obstructive sleep apnea (adult) (pediatric): Secondary | ICD-10-CM | POA: Insufficient documentation

## 2017-11-24 DIAGNOSIS — Z86718 Personal history of other venous thrombosis and embolism: Secondary | ICD-10-CM | POA: Insufficient documentation

## 2017-11-24 DIAGNOSIS — R110 Nausea: Secondary | ICD-10-CM | POA: Diagnosis not present

## 2017-11-24 DIAGNOSIS — I252 Old myocardial infarction: Secondary | ICD-10-CM | POA: Diagnosis not present

## 2017-11-24 DIAGNOSIS — E669 Obesity, unspecified: Secondary | ICD-10-CM | POA: Insufficient documentation

## 2017-11-24 DIAGNOSIS — E119 Type 2 diabetes mellitus without complications: Secondary | ICD-10-CM | POA: Diagnosis not present

## 2017-11-24 DIAGNOSIS — I110 Hypertensive heart disease with heart failure: Secondary | ICD-10-CM | POA: Diagnosis not present

## 2017-11-24 DIAGNOSIS — I5030 Unspecified diastolic (congestive) heart failure: Secondary | ICD-10-CM | POA: Diagnosis not present

## 2017-11-24 DIAGNOSIS — Z8249 Family history of ischemic heart disease and other diseases of the circulatory system: Secondary | ICD-10-CM | POA: Insufficient documentation

## 2017-11-24 DIAGNOSIS — Z8674 Personal history of sudden cardiac arrest: Secondary | ICD-10-CM | POA: Insufficient documentation

## 2017-11-24 DIAGNOSIS — R0609 Other forms of dyspnea: Secondary | ICD-10-CM | POA: Diagnosis not present

## 2017-11-24 DIAGNOSIS — R9439 Abnormal result of other cardiovascular function study: Secondary | ICD-10-CM | POA: Insufficient documentation

## 2017-11-24 DIAGNOSIS — R079 Chest pain, unspecified: Secondary | ICD-10-CM | POA: Insufficient documentation

## 2017-11-24 DIAGNOSIS — I2510 Atherosclerotic heart disease of native coronary artery without angina pectoris: Secondary | ICD-10-CM | POA: Insufficient documentation

## 2017-11-24 DIAGNOSIS — Z853 Personal history of malignant neoplasm of breast: Secondary | ICD-10-CM | POA: Insufficient documentation

## 2017-11-24 DIAGNOSIS — R5383 Other fatigue: Secondary | ICD-10-CM | POA: Diagnosis not present

## 2017-11-24 MED ORDER — TECHNETIUM TC 99M TETROFOSMIN IV KIT
27.9000 | PACK | Freq: Once | INTRAVENOUS | Status: AC | PRN
  Administered 2017-11-24: 27.9 via INTRAVENOUS
  Filled 2017-11-24: qty 28

## 2017-11-24 MED ORDER — AMINOPHYLLINE 25 MG/ML IV SOLN
75.0000 mg | Freq: Once | INTRAVENOUS | Status: AC
  Administered 2017-11-24: 75 mg via INTRAVENOUS

## 2017-11-24 MED ORDER — REGADENOSON 0.4 MG/5ML IV SOLN
0.4000 mg | Freq: Once | INTRAVENOUS | Status: AC
  Administered 2017-11-24: 0.4 mg via INTRAVENOUS

## 2017-11-25 ENCOUNTER — Encounter: Payer: Self-pay | Admitting: *Deleted

## 2017-11-25 ENCOUNTER — Ambulatory Visit (HOSPITAL_COMMUNITY)
Admission: RE | Admit: 2017-11-25 | Discharge: 2017-11-25 | Disposition: A | Discharge status: Home / Self Care | Payer: BC Managed Care – PPO | Source: Ambulatory Visit | Attending: Cardiology | Admitting: Cardiology

## 2017-11-25 LAB — MYOCARDIAL PERFUSION IMAGING
CHL CUP NUCLEAR SRS: 6
CHL CUP NUCLEAR SSS: 10
LV sys vol: 77 mL
LVDIAVOL: 121 mL (ref 46–106)
NUC STRESS TID: 0.98
Peak HR: 105 {beats}/min
Rest HR: 65 {beats}/min
SDS: 4

## 2017-11-25 MED ORDER — TECHNETIUM TC 99M TETROFOSMIN IV KIT
31.9000 | PACK | Freq: Once | INTRAVENOUS | Status: AC | PRN
  Administered 2017-11-25: 31.9 via INTRAVENOUS

## 2017-11-26 ENCOUNTER — Other Ambulatory Visit: Payer: Self-pay | Admitting: Cardiovascular Disease

## 2017-11-26 NOTE — Telephone Encounter (Signed)
Tammy, we only do anticoagulants, this is an antiplatelet

## 2017-11-26 NOTE — Telephone Encounter (Signed)
Please review for refill, Thanks !  

## 2017-11-29 DIAGNOSIS — C801 Malignant (primary) neoplasm, unspecified: Secondary | ICD-10-CM | POA: Insufficient documentation

## 2017-11-29 DIAGNOSIS — E119 Type 2 diabetes mellitus without complications: Secondary | ICD-10-CM | POA: Insufficient documentation

## 2017-11-29 DIAGNOSIS — Z170 Estrogen receptor positive status [ER+]: Secondary | ICD-10-CM | POA: Insufficient documentation

## 2017-11-29 DIAGNOSIS — R011 Cardiac murmur, unspecified: Secondary | ICD-10-CM | POA: Insufficient documentation

## 2017-11-29 DIAGNOSIS — K219 Gastro-esophageal reflux disease without esophagitis: Secondary | ICD-10-CM | POA: Insufficient documentation

## 2017-11-29 DIAGNOSIS — I10 Essential (primary) hypertension: Secondary | ICD-10-CM | POA: Insufficient documentation

## 2017-11-29 DIAGNOSIS — I2510 Atherosclerotic heart disease of native coronary artery without angina pectoris: Secondary | ICD-10-CM | POA: Insufficient documentation

## 2017-11-29 DIAGNOSIS — C50919 Malignant neoplasm of unspecified site of unspecified female breast: Secondary | ICD-10-CM | POA: Insufficient documentation

## 2017-11-29 DIAGNOSIS — N289 Disorder of kidney and ureter, unspecified: Secondary | ICD-10-CM | POA: Insufficient documentation

## 2017-11-29 DIAGNOSIS — G4730 Sleep apnea, unspecified: Secondary | ICD-10-CM | POA: Insufficient documentation

## 2017-11-29 DIAGNOSIS — Z8739 Personal history of other diseases of the musculoskeletal system and connective tissue: Secondary | ICD-10-CM | POA: Insufficient documentation

## 2017-11-29 DIAGNOSIS — I25119 Atherosclerotic heart disease of native coronary artery with unspecified angina pectoris: Secondary | ICD-10-CM | POA: Insufficient documentation

## 2017-11-29 DIAGNOSIS — E7800 Pure hypercholesterolemia, unspecified: Secondary | ICD-10-CM | POA: Insufficient documentation

## 2017-12-01 ENCOUNTER — Ambulatory Visit: Payer: BC Managed Care – PPO | Admitting: Family Medicine

## 2017-12-02 ENCOUNTER — Encounter: Payer: Self-pay | Admitting: Family Medicine

## 2017-12-02 ENCOUNTER — Other Ambulatory Visit: Payer: Self-pay

## 2017-12-02 ENCOUNTER — Ambulatory Visit: Payer: BC Managed Care – PPO | Admitting: Family Medicine

## 2017-12-02 VITALS — BP 128/84 | HR 88 | Temp 98.6°F | Resp 16 | Ht 62.0 in | Wt 191.2 lb

## 2017-12-02 DIAGNOSIS — G43009 Migraine without aura, not intractable, without status migrainosus: Secondary | ICD-10-CM

## 2017-12-02 DIAGNOSIS — M25531 Pain in right wrist: Secondary | ICD-10-CM | POA: Diagnosis not present

## 2017-12-02 DIAGNOSIS — I255 Ischemic cardiomyopathy: Secondary | ICD-10-CM | POA: Diagnosis not present

## 2017-12-02 MED ORDER — SUMATRIPTAN SUCCINATE 25 MG PO TABS: ORAL_TABLET | ORAL | 3 refills | Status: DC

## 2017-12-02 NOTE — Patient Instructions (Addendum)
   IF you received an x-ray today, you will receive an invoice from [facility]. Please contact  Radiology at [phone] with questions or concerns regarding your invoice.   IF you received labwork today, you will receive an invoice from [facility]. Please contact [facility] at [phone] with questions or concerns regarding your invoice.   Our billing staff will not be able to assist you with questions regarding bills from these companies.  You will be contacted with the lab results as soon as they are available. The fastest way to get your results is to activate your My Chart account. Instructions are located on the last page of this paperwork. If you have not heard from us regarding the results in 2 weeks, please contact this office.      Migraine Headache A migraine headache is an intense, throbbing pain on one side or both sides of the head. Migraines may also cause other symptoms, such as nausea, vomiting, and sensitivity to light and noise. What are the causes? Doing or taking certain things may also trigger migraines, such as:  Alcohol.  Smoking.  Medicines, such as: ? Medicine used to treat chest pain (nitroglycerine). ? Birth control pills. ? Estrogen pills. ? Certain blood pressure medicines.  Aged cheeses, chocolate, or caffeine.  Foods or drinks that contain nitrates, glutamate, aspartame, or tyramine.  Physical activity.  Other things that may trigger a migraine include:  Menstruation.  Pregnancy.  Hunger.  Stress, lack of sleep, too much sleep, or fatigue.  Weather changes.  What increases the risk? The following factors may make you more likely to experience migraine headaches:  Age. Risk increases with age.  Family history of migraine headaches.  Being Caucasian.  Depression and anxiety.  Obesity.  Being a woman.  Having a hole in the heart (patent foramen ovale) or other heart problems.  What are the signs or  symptoms? The main symptom of this condition is pulsating or throbbing pain. Pain may:  Happen in any area of the head, such as on one side or both sides.  Interfere with daily activities.  Get worse with physical activity.  Get worse with exposure to bright lights or loud noises.  Other symptoms may include:  Nausea.  Vomiting.  Dizziness.  General sensitivity to bright lights, loud noises, or smells.  Before you get a migraine, you may get warning signs that a migraine is developing (aura). An aura may include:  Seeing flashing lights or having blind spots.  Seeing bright spots, halos, or zigzag lines.  Having tunnel vision or blurred vision.  Having numbness or a tingling feeling.  Having trouble talking.  Having muscle weakness.  How is this diagnosed? A migraine headache can be diagnosed based on:  Your symptoms.  A physical exam.  Tests, such as CT scan or MRI of the head. These imaging tests can help rule out other causes of headaches.  Taking fluid from the spine (lumbar puncture) and analyzing it (cerebrospinal fluid analysis, or CSF analysis).  How is this treated? A migraine headache is usually treated with medicines that:  Relieve pain.  Relieve nausea.  Prevent migraines from coming back.  Treatment may also include:  Acupuncture.  Lifestyle changes like avoiding foods that trigger migraines.  Follow these instructions at home: Medicines  Take over-the-counter and prescription medicines only as told by your health care provider.  Do not drive or use heavy machinery while taking prescription pain medicine.  To prevent or treat constipation while you   are taking prescription pain medicine, your health care provider may recommend that you: ? Drink enough fluid to keep your urine clear or pale yellow. ? Take over-the-counter or prescription medicines. ? Eat foods that are high in fiber, such as fresh fruits and vegetables, whole grains,  and beans. ? Limit foods that are high in fat and processed sugars, such as fried and sweet foods. Lifestyle  Avoid alcohol use.  Do not use any products that contain nicotine or tobacco, such as cigarettes and e-cigarettes. If you need help quitting, ask your health care provider.  Get at least 8 hours of sleep every night.  Limit your stress. General instructions   Keep a journal to find out what may trigger your migraine headaches. For example, write down: ? What you eat and drink. ? How much sleep you get. ? Any change to your diet or medicines.  If you have a migraine: ? Avoid things that make your symptoms worse, such as bright lights. ? It may help to lie down in a dark, quiet room. ? Do not drive or use heavy machinery. ? Ask your health care provider what activities are safe for you while you are experiencing symptoms.  Keep all follow-up visits as told by your health care provider. This is important. Contact a health care provider if:  You develop symptoms that are different or more severe than your usual migraine symptoms. Get help right away if:  Your migraine becomes severe.  You have a fever.  You have a stiff neck.  You have vision loss.  Your muscles feel weak or like you cannot control them.  You start to lose your balance often.  You develop trouble walking.  You faint. This information is not intended to replace advice given to you by your health care provider. Make sure you discuss any questions you have with your health care provider. Document Released: 11/16/2005 Document Revised: 06/05/2016 Document Reviewed: 05/04/2016 Elsevier Interactive Patient Education  2017 [employer] Inc.  

## 2017-12-02 NOTE — Progress Notes (Signed)
Chief Complaint  Patient presents with  . Follow-up    headaches, medication is helping but per pt told to take at onset of ha but she never knows when one is coming.  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 She states that the headaches are coming first thing in the mornings and had a headache this morning. She reports that the imitrex has been helping. She gets no warning signals. She reports that the sumatriptan seems to help the headache lessen.   Cardiomyopathy Her cardiology notes said she is scheduled for cardiology visit to discuss cath 1/4 Procedure Component Value Ref Range Date/Time  MYOCARDIAL PERFUSION IMAGING [[id number]] Resulted: 11/25/17 1400  Order Status: Completed Updated: 11/25/17 1407   Rest HR 65 bpm    Rest BP 156/85 mmHg    Peak HR 105 bpm    Peak BP 181/77 mmHg    SSS 10   SRS 6   SDS 4   TID 0.98   LV sys vol 77 mL    LV dias vol 121 46 - 106 mL   Narrative:    The left ventricular ejection fraction is moderately decreased (30-44%).  Nuclear stress EF: 36%.  There was no ST segment deviation noted during stress.  There is a large defect of severe severity present in the mid anterior,  mid inferior, apical anterior, apical septal, apical inferior, apical  lateral and apex location. The defect is partially reversible. This is  consistent with infarct with small amount of peri infarct ischemia in the  inferior and apical walls.  This is a high risk study.  Compared to prior study the EF has declined and there appears to be peri  infarct ischemia in the inferior wall although this could also be  variations in diaphragmatic attenuation artifact.   She  reports one episode of dizziness in December No diaphoresis No shortness of breath No chest pains She is compliant with her meds   Past Medical History:  Diagnosis Date  . Breast cancer ([facility])   . Cancer ([place])   . Coronary artery disease   . Depression   . DVT (deep venous thrombosis) ([place]) 11/15   left femerol artery injury during resusciation.  [name] GERD (gastroesophageal reflux disease)   . HCAP (healthcare-associated pneumonia) 11/22/2014  . Heart murmur   . High cholesterol   . History of blood transfusion 09/2014   related to MI  . History of gout   . Hypertension   . Liver laceration 10/19/14  . Pulmonary edema 10/19/14  . Renal disorder   . Sleep apnea    "suppose to wear mask but I don't" (11/22/2014)  . STEMI (ST elevation myocardial infarction) ([facility]) 10/19/14  . Type II diabetes mellitus ([place])     Current Outpatient Medications  Medication Sig Dispense Refill  . acetaminophen (TYLENOL) 500 MG tablet Take 1,000 mg by mouth 2 (two) times daily as needed (pain).     [name] aspirin EC 81 MG tablet Take 1 tablet (81 mg total) by mouth daily.    [name] BRILINTA 60 MG TABS tablet TAKE 1 TABLET (60 MG TOTAL) BY MOUTH 2 (TWO)  TIMES DAILY. 180 tablet 2  . diclofenac sodium (VOLTAREN) 1 % GEL Apply 2 g topically 4 (four) times daily. 1 Tube 3  . exemestane (AROMASIN) 25 MG tablet Take 25 mg by mouth daily after breakfast.    . furosemide (LASIX) 40 MG tablet Take 1 tablet (40 mg total) by mouth daily at 12 noon. 30 tablet   . HYDROcodone-acetaminophen (NORCO/VICODIN) 5-325 MG tablet Take 1 tablet by mouth every 6 (six) hours as needed for severe pain. 10 tablet 0  . loratadine (CLARITIN) 10 MG tablet Take 10 mg by mouth daily.    [name] losartan (COZAAR) 25 MG tablet Take 25 mg by mouth daily.    . magnesium oxide (MAG-OX) 400 MG tablet Take 1,200 mg by mouth daily.     . metFORMIN (GLUCOPHAGE-XR) 500 MG 24 hr tablet Take by mouth daily with breakfast.     . metoprolol succinate (TOPROL-XL)  25 MG 24 hr tablet TAKE 1 TABLET (25 MG TOTAL) BY MOUTH DAILY. 30 tablet 11  . nitroGLYCERIN (NITROSTAT) 0.4 MG SL tablet Place 1 tablet (0.4 mg total) under the tongue every 5 (five) minutes as needed for chest pain. max of 3 if no relief call 911 25 tablet PRN  . potassium chloride SA (K-DUR,KLOR-CON) 20 MEQ tablet Take 20 mEq by mouth daily.     . rosuvastatin (CRESTOR) 20 MG tablet TAKE 1 TABLET BY MOUTH 3 TIMES PER WEEK 30 tablet 6  . SUMAtriptan (IMITREX) 25 MG tablet Take one tablet at the onset of headache. May repeat in 2 hours if headache persists or recurs. 30 tablet 3   No current facility-administered medications for this visit.     Allergies:  Allergies  Allergen Reactions  . Dilaudid [Hydromorphone Hcl] Anaphylaxis, Itching and Swelling  . Hydromorphone Anaphylaxis, Itching and Swelling  . Epinephrine Anxiety and Other (See Comments)    Severe anxiety  . Lactose Intolerance (Gi) Diarrhea    Past Surgical History:  Procedure Laterality Date  . ABDOMINAL HYSTERECTOMY  ~ 2000  . APPLICATION OF WOUND VAC  10/2014   "over my naval" (11/22/2014)  . BREAST LUMPECTOMY WITH AXILLARY LYMPH NODE BIOPSY    . BREAST SURGERY    . [place]; 1982  . CORONARY ANGIOPLASTY WITH STENT PLACEMENT  09/2014   "multiple"/notes 11/22/2014  . EXTRACORPOREAL CIRCULATION    . Femerol artery repair Right 09/2014   pt insists it was a right femoral artery repair on 11/22/2014  . LAPAROSCOPIC ABDOMINAL EXPLORATION    . LEFT VENTRICULAR ASSIST DEVICE     pt is not aware of this hx on 11/22/2014  . Liver laceration repair  09/2014   related to CPR/notes 11/22/2014  . TRACHEOSTOMY  09/2014   "closed on it's own when they took it out"  . TUBAL LIGATION  1982    Social History   Socioeconomic History  . Marital status: Divorced    Spouse name: None  . Number of children: None  . Years of education: None  . Highest education level: None  Social Needs  . Financial resource  strain: None  . Food insecurity - worry: None  . Food insecurity - inability: None  . Transportation needs - medical: None  . Transportation needs - non-medical: None  Occupational History  . None  Tobacco Use  . Smoking status: Never Smoker  . Smokeless tobacco: Never Used  Substance and Sexual Activity  . Alcohol use: No  . Drug use: No  .  Sexual activity: None  Other Topics Concern  . None  Social History Narrative  . None    Family History  Problem Relation Age of Onset  . Diabetes Mother   . Hypertension Mother   . COPD Mother   . Heart attack Father   . Alzheimer's disease Father   . Diabetes Brother   . Stroke Neg Hx      ROS Review of Systems See HPI [place]: No fevers or chills No malaise No diaphoresis Skin: No rash or itching Eyes: no blurry vision, no double vision GU: no dysuria or hematuria Neuro: one episode of dizziness in December 20188 +headaches +numbness and tingling in fingertips all others reviewed and negative   Objective: Vitals:   12/02/17 0940  BP: 128/84  Pulse: 88  Resp: 16  Temp: 98.6 F (37 C)  TempSrc: Oral  SpO2: 98%  Weight: 191 lb 3.2 oz (86.7 kg)  Height: 5\' 2"  (1.575 m)    Physical Exam  Constitutional: She is oriented to person, place, and time. She appears well-developed and well-nourished.  HENT:  Head: Normocephalic and atraumatic.  Eyes: Conjunctivae and EOM are normal.  Neck: Normal range of motion. No thyromegaly present.  Cardiovascular: Normal rate, regular rhythm and normal heart sounds.  No murmur heard. Pulmonary/Chest: Effort normal and breath sounds normal. No stridor. No respiratory distress.  Musculoskeletal: Normal range of motion.  Neurological: She is alert and oriented to person, place, and time. She displays no tremor. She exhibits normal muscle tone.  Psychiatric: She has a normal mood and affect. Her behavior is normal. Judgment and thought content normal.     Assessment and  Plan Jamelle was seen today for follow-up.  Diagnoses and all orders for this visit:  Migraine without aura and without status migrainosus, not intractable- headache symptoms improve with imitrex Frequency unchanged Wondering if her heart disease is impacting her blood flow but since she has no  Neuro deficits will continue prn imitrex and await cath Discussed that she should try to look at factors that might make headaches worse like dehydration  Cardiomyopathy, ischemic- will meet with Dr. Oval Linsey to discuss Cath 12/03/17 She has a reduction in her EF and signs of ischemic changes  Right wrist pain- improved with supportive carpal tunnel brace  Other orders -     Cancel: Tdap vaccine greater than or equal to 7yo IM -     Cancel: Pneumococcal conjugate vaccine 13-valent IM -     Cancel: Ambulatory referral to Gastroenterology -     SUMAtriptan (IMITREX) 25 MG tablet; Take one tablet at the onset of headache. May repeat in 2 hours if headache persists or recurs.     Arthur

## 2017-12-03 ENCOUNTER — Ambulatory Visit: Payer: BC Managed Care – PPO | Admitting: Cardiovascular Disease

## 2017-12-03 ENCOUNTER — Encounter: Payer: Self-pay | Admitting: Cardiovascular Disease

## 2017-12-03 VITALS — BP 146/78 | HR 92 | Ht 62.0 in | Wt 193.4 lb

## 2017-12-03 DIAGNOSIS — R079 Chest pain, unspecified: Secondary | ICD-10-CM

## 2017-12-03 DIAGNOSIS — E785 Hyperlipidemia, unspecified: Secondary | ICD-10-CM

## 2017-12-03 DIAGNOSIS — R9439 Abnormal result of other cardiovascular function study: Secondary | ICD-10-CM

## 2017-12-03 DIAGNOSIS — I5032 Chronic diastolic (congestive) heart failure: Secondary | ICD-10-CM

## 2017-12-03 DIAGNOSIS — G4733 Obstructive sleep apnea (adult) (pediatric): Secondary | ICD-10-CM | POA: Diagnosis not present

## 2017-12-03 DIAGNOSIS — I209 Angina pectoris, unspecified: Secondary | ICD-10-CM

## 2017-12-03 DIAGNOSIS — I110 Hypertensive heart disease with heart failure: Secondary | ICD-10-CM

## 2017-12-03 NOTE — Patient Instructions (Signed)
Medication Instructions:  Your physician recommends that you continue on your current medications as directed. Please refer to the Current Medication list given to you today.  Labwork: CBC/BMET/PT/INR TODAY   Testing/Procedures: Your physician has requested that you have a cardiac catheterization. Cardiac catheterization is used to diagnose and/or treat various heart conditions. Doctors may recommend this procedure for a number of different reasons. The most common reason is to evaluate chest pain. Chest pain can be a symptom of coronary artery disease (CAD), and cardiac catheterization can show whether plaque is narrowing or blocking your heart's arteries. This procedure is also used to evaluate the valves, as well as measure the blood flow and oxygen levels in different parts of your heart. For further information please visit [email or web address]. Please follow instruction sheet, as given.  Follow-Up: Your physician recommends that you schedule a follow-up appointment in: 2 MONTH OV   Any Other Special Instructions Will Be Listed Below (If Applicable).                 [facility] [address] [place] [address] Dept: [phone] Loc: [phone]  Marie Tanner  12/03/2017  You are scheduled for a Cardiac Catheterization on Tuesday, January 8 with Dr. Glenetta Hew.  1. Please arrive at the [facility] (Main Entrance A) at [facility]: [place], [place] [address] at 10:00 AM (two hours before your procedure to ensure your preparation). Free valet parking service is available.   Special note: Every effort is made to have your procedure done on time. Please understand that emergencies sometimes delay scheduled procedures.  2. Diet: Do not eat or drink anything after midnight prior to your procedure except sips of water to take medications.  3. Labs: TODAY AT  [facility]   4. Medication instructions in preparation for your procedure:  NO METFORMIN FOR 24 HOURS PRIOR AND 48 HOURS AFTER   NO FUROSEMIDE MORNING OF PROCEDURE   On the morning of your procedure, take your Aspirin and any morning medicines NOT listed above.  You may use sips of water.  5. Plan for one night stay--bring personal belongings. 6. Bring a current list of your medications and current insurance cards. 7. You MUST have a responsible person to drive you home. 8. Someone MUST be with you the first 24 hours after you arrive home or your discharge will be delayed. 9. Please wear clothes that are easy to get on and off and wear slip-on shoes.  Thank you for allowing [place] to care for you!   -- [facility]

## 2017-12-03 NOTE — Progress Notes (Signed)
Cardiology Office Note   Date:  12/03/2017   ID:  Marie Tanner, DOB Feb 07, 1956, MRN [id number]  PCP:  Forrest Moron, MD  Cardiologist:   Skeet Latch, MD  Oncologist: Samuel Bouche, MD Radiation Oncologist: Ladona Horns, MD Surgical Oncologist: Nancie Neas, MD  Chief Complaint  Patient presents with  . Follow-up    History of Present Illness: Marie Tanner is a 62 y.o. female with hypertension, chronic diastolic heart failure, OSA not on CPAP, diabetes, prior DVT, breast cancer, and mitral regurgitation who presents for follow up.  Marie Tanner was previously a patient of [name].  In 2015 she had a STEMI and cardiac arrest. She was cared for at [facility]. She ultimately received 2 DES (LM/LAD and LCx).  She had a prolonged hospitalization requiring tracheostomy.  Her liver was lacerated during CPR.  She required surgical repair after femoral catheterization.  She also developed a DVT in the hospital.  One week after discharge she developed pneumonia and was admitted at [facility].  She has been seen in the hospital twice for chest pain and ruled out for MI.  She had a negative Myoview 05/2015.  She also had negative CT scans for PE.  Marie Tanner also underwent surgery for breast cancer on 02/21/16.  She completed her radiation in September 2017.  At her last appointment she was referred for sleep study which was abnormal.  However, she declined using the CPAP device and said that she would go have a fitting for an oral airway.  However she has not done this.  Ms. Nawal Tanner had an echo 06/2016 that showed LVEF 50-55% with grade 1 diastolic dysfunction.    At her last appointment Marie Tanner reported an episode of chest pain that occurred while driving.  She was referred for [facility] 11/24/17 that revealed LVEF 36% with anterior, anteroseptal, apical inferior and apical lateral defects.  There was concern for infarct with peri-infarct ischemia in the inferior and  apical walls.  Since her last appointment Marie Tanner has experienced some intermittent chest pain.  It sometimes happens when walking around and sometime while at rest.  It is associated with shortness of breath but no nausea or diaphoresis.  She denies lower extremity edema, orthopnea or PND.  Her BP has been well-controlled at home.     Past Medical History:  Diagnosis Date  . Breast cancer ([place])   . Cancer ([place])   . Coronary artery disease   . Depression   . DVT (deep venous thrombosis) ([place]) 11/15   left femerol artery injury during resusciation.  Marie Tanner GERD (gastroesophageal reflux disease)   . HCAP (healthcare-associated pneumonia) 11/22/2014  . Heart murmur   . High cholesterol   . History of blood transfusion 09/2014   related to MI  . History of gout   . Hypertension   . Liver laceration 10/19/14  . Pulmonary edema 10/19/14  . Renal disorder   . Sleep apnea    "suppose to wear mask but I don't" (11/22/2014)  . STEMI (ST elevation myocardial infarction) ([place]) 10/19/14  . Type II diabetes mellitus ([place])     Past Surgical History:  Procedure Laterality Date  . ABDOMINAL HYSTERECTOMY  ~ 2000  . APPLICATION OF WOUND VAC  10/2014   "over my naval" (11/22/2014)  . BREAST LUMPECTOMY WITH AXILLARY LYMPH NODE BIOPSY    . BREAST SURGERY    . [place]; 1982  . CORONARY ANGIOPLASTY WITH STENT PLACEMENT  09/2014   "multiple"/notes 11/22/2014  . EXTRACORPOREAL CIRCULATION    . Femerol artery repair Right 09/2014   pt insists it was a right femoral artery repair on 11/22/2014  . LAPAROSCOPIC ABDOMINAL EXPLORATION    . LEFT VENTRICULAR ASSIST DEVICE     pt is not aware of this hx on 11/22/2014  . Liver laceration repair  09/2014   related to CPR/notes 11/22/2014  . TRACHEOSTOMY  09/2014   "closed on it's own when they took it out"  . TUBAL LIGATION  1982     Current Outpatient Medications  Medication Sig Dispense Refill  . acetaminophen (TYLENOL) 500 MG  tablet Take 1,000 mg by mouth 2 (two) times daily as needed (pain).     Marie Tanner aspirin EC 81 MG tablet Take 1 tablet (81 mg total) by mouth daily.    Marie Tanner BRILINTA 60 MG TABS tablet TAKE 1 TABLET (60 MG TOTAL) BY MOUTH 2 (TWO) TIMES DAILY. 180 tablet 2  . diclofenac sodium (VOLTAREN) 1 % GEL Apply 2 g topically 4 (four) times daily. 1 Tube 3  . exemestane (AROMASIN) 25 MG tablet Take 25 mg by mouth daily after breakfast.    . furosemide (LASIX) 40 MG tablet Take 1 tablet (40 mg total) by mouth daily at 12 noon. 30 tablet   . HYDROcodone-acetaminophen (NORCO/VICODIN) 5-325 MG tablet Take 1 tablet by mouth every 6 (six) hours as needed for severe pain. 10 tablet 0  . loratadine (CLARITIN) 10 MG tablet Take 10 mg by mouth daily.    Marie Tanner losartan (COZAAR) 25 MG tablet Take 25 mg by mouth daily.    . magnesium oxide (MAG-OX) 400 MG tablet Take 1,200 mg by mouth daily.     . metFORMIN (GLUCOPHAGE-XR) 500 MG 24 hr tablet Take by mouth daily with breakfast.     . metoprolol succinate (TOPROL-XL) 25 MG 24 hr tablet TAKE 1 TABLET (25 MG TOTAL) BY MOUTH DAILY. 30 tablet 11  . nitroGLYCERIN (NITROSTAT) 0.4 MG SL tablet Place 1 tablet (0.4 mg total) under the tongue every 5 (five) minutes as needed for chest pain. max of 3 if no relief call 911 25 tablet PRN  . potassium chloride SA (K-DUR,KLOR-CON) 20 MEQ tablet Take 20 mEq by mouth daily.     . rosuvastatin (CRESTOR) 20 MG tablet TAKE 1 TABLET BY MOUTH 3 TIMES PER WEEK 30 tablet 6  . SUMAtriptan (IMITREX) 25 MG tablet Take one tablet at the onset of headache. May repeat in 2 hours if headache persists or recurs. 30 tablet 3   No current facility-administered medications for this visit.     Allergies:   Dilaudid [hydromorphone hcl]; Hydromorphone; Epinephrine; and Lactose intolerance (gi)    Social History:  The patient  reports that  has never smoked. she has never used smokeless tobacco. She reports that she does not drink alcohol or use drugs.   Family History:   The patient's family history includes Alzheimer's disease in her father; COPD in her mother; Diabetes in her brother and mother; Heart attack in her father; Hypertension in her mother.    ROS:  Please see the history of present illness.   Otherwise, review of systems are positive for neck pain.   All other systems are reviewed and negative.    PHYSICAL EXAM: VS:  BP (!) 146/78   Pulse 92   Ht 5\' 2"  (1.575 m)   Wt 193 lb 6.4 oz (87.7 kg)   BMI 35.37 kg/m  , BMI  Body mass index is 35.37 kg/m. GENERAL:  Well appearing HEENT: Pupils equal round and reactive, fundi not visualized, oral mucosa unremarkable NECK:  No jugular venous distention, waveform within normal limits, carotid upstroke brisk and symmetric, no bruits, no thyromegaly LUNGS:  Clear to auscultation bilaterally HEART:  RRR.  PMI not displaced or sustained,S1 and S2 within normal limits, no S3, no S4, no clicks, no rubs, no murmurs ABD:  Flat, positive bowel sounds normal in frequency in pitch, no bruits, no rebound, no guarding, no midline pulsatile mass, no hepatomegaly, no splenomegaly EXT:  2 plus pulses throughout, no edema, no cyanosis no clubbing SKIN:  No rashes no nodules NEURO:  Cranial nerves II through XII grossly intact, motor grossly intact throughout PSYCH:  Cognitively intact, oriented to person place and time   EKG:  EKG is ordered today. 02/11/16: Sinus arrhythmia.  Rate 71 bpm. Non-specific ST-T changes. 10/05/16: Sinus rhythm. Rate 85 bpm. Prior inferior infarct. 11/16/17: Sinus rhythm.  Rate 87 bpm.  Prior inferior infarct.    Echo 07/01/16: Study Conclusions  - Left ventricle: The cavity size was normal. Systolic function was   normal. The estimated ejection fraction was in the range of 50%   to 55%. Wall motion was normal; there were no regional wall   motion abnormalities. Doppler parameters are consistent with   abnormal left ventricular relaxation (grade 1 diastolic   dysfunction). Doppler  parameters are consistent with   indeterminate ventricular filling pressure. - Aortic valve: Transvalvular velocity was within the normal range.   There was no stenosis. There was no regurgitation. - Mitral valve: There was mild regurgitation. - Left atrium: The atrium was mildly dilated. - Right ventricle: The cavity size was normal. Wall thickness was   normal. Systolic function was normal. - Atrial septum: No defect or patent foramen ovale was identified   by color flow Doppler. - Tricuspid valve: There was mild regurgitation. - Pulmonary arteries: Systolic pressure was within the normal   range. PA peak pressure: 23 mm Hg (S).  Lexiscan Myoview 11/24/17:  The left ventricular ejection fraction is moderately decreased (30-44%).  Nuclear stress EF: 36%.  There was no ST segment deviation noted during stress.  There is a large defect of severe severity present in the mid anterior, mid inferior, apical anterior, apical septal, apical inferior, apical lateral and apex location. The defect is partially reversible. This is consistent with infarct with small amount of peri infarct ischemia in the inferior and apical walls.  This is a high risk study.  Compared to prior study the EF has declined and there appears to be peri infarct ischemia in the inferior wall although this could also be variations in diaphragmatic attenuation artifact.     Recent Labs: 10/28/2017: Hemoglobin 12.4; Platelets 267; TSH 2.560    Lipid Panel    Component Value Date/Time   CHOL 195 10/28/2017 1053   TRIG 66 10/28/2017 1053   HDL 58 10/28/2017 1053   CHOLHDL 3.4 10/28/2017 1053   CHOLHDL 3.1 10/05/2016 0937   VLDL 15 10/05/2016 0937   LDLCALC 124 (H) 10/28/2017 1053      Wt Readings from Last 3 Encounters:  12/03/17 193 lb 6.4 oz (87.7 kg)  12/02/17 191 lb 3.2 oz (86.7 kg)  11/24/17 192 lb (87.1 kg)      ASSESSMENT AND PLAN:  # CAD s/p STEMI: # Angina:  Ms. Fidelis Loth had ST  elevation myocardial infarction with stents placed in the LAD/left main and left  circumflex arteries.  She is on  low dose ticagrelor indefinitely.  Exercise myoview was concerning for peri-infarct ischemia in the inferior and apical regions.  She will go for [facility] with Dr. Ellyn Hack next week.  Continue aspirin, metoprolol and rosuvastatin.    # Chronic diastolic heart failure: Ms. Batra is euvolemic.  Her weight is stable. Continue furosemide and BP management.  LVEF was reduced according to [employer].  Will get echo after cath to assess.   # Hypertensive heart disease: BP elevated today but typically well-controlled on metoprolol and losartan.  Suspect the stress of needing cath has elevated her BP.    # Hyperlipidemia:  Rosuvastatin was reduced due to muscle cramping.  She still has mild cramping but her LDL is not at goal. She did not tolerate atorvastatin either.  She will see our pharmacist about PCSK9 inhibitor.   # OSA: She needs to wear her CPAP or get an oral device.     Current medicines are reviewed at length with the patient today.  The patient does not have concerns regarding medicines.  The following changes have been made: none  Labs/ tests ordered today include:   Orders Placed This Encounter  Procedures  . INR/PT  . Basic metabolic panel  . CBC with Differential/Platelet     Disposition:   FU with Bayley Hurn C. Oval Linsey, MD, [facility] in 2 months.   This note was written with the assistance of speech recognition software.  Please excuse any transcriptional errors.  Signed, Cherene Dobbins C. Oval Linsey, MD, [facility]  12/03/2017 12:35 PM    [facility]

## 2017-12-03 NOTE — H&P (View-Only) (Signed)
Cardiology Office Note   Date:  12/03/2017   ID:  Marie Tanner, DOB February 13, 1956, MRN [id number]  PCP:  Forrest Moron, MD  Cardiologist:   Skeet Latch, MD  Oncologist: Samuel Bouche, MD Radiation Oncologist: Ladona Horns, MD Surgical Oncologist: Nancie Neas, MD  Chief Complaint  Patient presents with  . Follow-up    History of Present Illness: Marie Tanner is a 62 y.o. female with hypertension, chronic diastolic heart failure, OSA not on CPAP, diabetes, prior DVT, breast cancer, and mitral regurgitation who presents for follow up.  Marie Tanner was previously a patient of [name].  In 2015 she had a STEMI and cardiac arrest. She was cared for at [facility]. She ultimately received 2 DES (LM/LAD and LCx).  She had a prolonged hospitalization requiring tracheostomy.  Her liver was lacerated during CPR.  She required surgical repair after femoral catheterization.  She also developed a DVT in the hospital.  One week after discharge she developed pneumonia and was admitted at [facility].  She has been seen in the hospital twice for chest pain and ruled out for MI.  She had a negative Myoview 05/2015.  She also had negative CT scans for PE.  Marie Tanner also underwent surgery for breast cancer on 02/21/16.  She completed her radiation in September 2017.  At her last appointment she was referred for sleep study which was abnormal.  However, she declined using the CPAP device and said that she would go have a fitting for an oral airway.  However she has not done this.  Marie Tanner had an echo 06/2016 that showed LVEF 50-55% with grade 1 diastolic dysfunction.    At her last appointment Marie Tanner reported an episode of chest pain that occurred while driving.  She was referred for [facility] 11/24/17 that revealed LVEF 36% with anterior, anteroseptal, apical inferior and apical lateral defects.  There was concern for infarct with peri-infarct ischemia in the inferior and  apical walls.  Since her last appointment Marie Tanner has experienced some intermittent chest pain.  It sometimes happens when walking around and sometime while at rest.  It is associated with shortness of breath but no nausea or diaphoresis.  She denies lower extremity edema, orthopnea or PND.  Her BP has been well-controlled at home.     Past Medical History:  Diagnosis Date  . Breast cancer ([place])   . Cancer ([place])   . Coronary artery disease   . Depression   . DVT (deep venous thrombosis) ([place]) 11/15   left femerol artery injury during resusciation.  [name] GERD (gastroesophageal reflux disease)   . HCAP (healthcare-associated pneumonia) 11/22/2014  . Heart murmur   . High cholesterol   . History of blood transfusion 09/2014   related to MI  . History of gout   . Hypertension   . Liver laceration 10/19/14  . Pulmonary edema 10/19/14  . Renal disorder   . Sleep apnea    "suppose to wear mask but I don't" (11/22/2014)  . STEMI (ST elevation myocardial infarction) ([place]) 10/19/14  . Type II diabetes mellitus ([place])     Past Surgical History:  Procedure Laterality Date  . ABDOMINAL HYSTERECTOMY  ~ 2000  . APPLICATION OF WOUND VAC  10/2014   "over my naval" (11/22/2014)  . BREAST LUMPECTOMY WITH AXILLARY LYMPH NODE BIOPSY    . BREAST SURGERY    . [place]; 1982  . CORONARY ANGIOPLASTY WITH STENT PLACEMENT  09/2014   "multiple"/notes 11/22/2014  . EXTRACORPOREAL CIRCULATION    . Femerol artery repair Right 09/2014   pt insists it was a right femoral artery repair on 11/22/2014  . LAPAROSCOPIC ABDOMINAL EXPLORATION    . LEFT VENTRICULAR ASSIST DEVICE     pt is not aware of this hx on 11/22/2014  . Liver laceration repair  09/2014   related to CPR/notes 11/22/2014  . TRACHEOSTOMY  09/2014   "closed on it's own when they took it out"  . TUBAL LIGATION  1982     Current Outpatient Medications  Medication Sig Dispense Refill  . acetaminophen (TYLENOL) 500 MG  tablet Take 1,000 mg by mouth 2 (two) times daily as needed (pain).     [name] aspirin EC 81 MG tablet Take 1 tablet (81 mg total) by mouth daily.    [name] BRILINTA 60 MG TABS tablet TAKE 1 TABLET (60 MG TOTAL) BY MOUTH 2 (TWO) TIMES DAILY. 180 tablet 2  . diclofenac sodium (VOLTAREN) 1 % GEL Apply 2 g topically 4 (four) times daily. 1 Tube 3  . exemestane (AROMASIN) 25 MG tablet Take 25 mg by mouth daily after breakfast.    . furosemide (LASIX) 40 MG tablet Take 1 tablet (40 mg total) by mouth daily at 12 noon. 30 tablet   . HYDROcodone-acetaminophen (NORCO/VICODIN) 5-325 MG tablet Take 1 tablet by mouth every 6 (six) hours as needed for severe pain. 10 tablet 0  . loratadine (CLARITIN) 10 MG tablet Take 10 mg by mouth daily.    [name] losartan (COZAAR) 25 MG tablet Take 25 mg by mouth daily.    . magnesium oxide (MAG-OX) 400 MG tablet Take 1,200 mg by mouth daily.     . metFORMIN (GLUCOPHAGE-XR) 500 MG 24 hr tablet Take by mouth daily with breakfast.     . metoprolol succinate (TOPROL-XL) 25 MG 24 hr tablet TAKE 1 TABLET (25 MG TOTAL) BY MOUTH DAILY. 30 tablet 11  . nitroGLYCERIN (NITROSTAT) 0.4 MG SL tablet Place 1 tablet (0.4 mg total) under the tongue every 5 (five) minutes as needed for chest pain. max of 3 if no relief call 911 25 tablet PRN  . potassium chloride SA (K-DUR,KLOR-CON) 20 MEQ tablet Take 20 mEq by mouth daily.     . rosuvastatin (CRESTOR) 20 MG tablet TAKE 1 TABLET BY MOUTH 3 TIMES PER WEEK 30 tablet 6  . SUMAtriptan (IMITREX) 25 MG tablet Take one tablet at the onset of headache. May repeat in 2 hours if headache persists or recurs. 30 tablet 3   No current facility-administered medications for this visit.     Allergies:   Dilaudid [hydromorphone hcl]; Hydromorphone; Epinephrine; and Lactose intolerance (gi)    Social History:  The patient  reports that  has never smoked. she has never used smokeless tobacco. She reports that she does not drink alcohol or use drugs.   Family History:   The patient's family history includes Alzheimer's disease in her father; COPD in her mother; Diabetes in her brother and mother; Heart attack in her father; Hypertension in her mother.    ROS:  Please see the history of present illness.   Otherwise, review of systems are positive for neck pain.   All other systems are reviewed and negative.    PHYSICAL EXAM: VS:  BP (!) 146/78   Pulse 92   Ht 5\' 2"  (1.575 m)   Wt 193 lb 6.4 oz (87.7 kg)   BMI 35.37 kg/m  , BMI  Body mass index is 35.37 kg/m. GENERAL:  Well appearing HEENT: Pupils equal round and reactive, fundi not visualized, oral mucosa unremarkable NECK:  No jugular venous distention, waveform within normal limits, carotid upstroke brisk and symmetric, no bruits, no thyromegaly LUNGS:  Clear to auscultation bilaterally HEART:  RRR.  PMI not displaced or sustained,S1 and S2 within normal limits, no S3, no S4, no clicks, no rubs, no murmurs ABD:  Flat, positive bowel sounds normal in frequency in pitch, no bruits, no rebound, no guarding, no midline pulsatile mass, no hepatomegaly, no splenomegaly EXT:  2 plus pulses throughout, no edema, no cyanosis no clubbing SKIN:  No rashes no nodules NEURO:  Cranial nerves II through XII grossly intact, motor grossly intact throughout PSYCH:  Cognitively intact, oriented to person place and time   EKG:  EKG is ordered today. 02/11/16: Sinus arrhythmia.  Rate 71 bpm. Non-specific ST-T changes. 10/05/16: Sinus rhythm. Rate 85 bpm. Prior inferior infarct. 11/16/17: Sinus rhythm.  Rate 87 bpm.  Prior inferior infarct.    Echo 07/01/16: Study Conclusions  - Left ventricle: The cavity size was normal. Systolic function was   normal. The estimated ejection fraction was in the range of 50%   to 55%. Wall motion was normal; there were no regional wall   motion abnormalities. Doppler parameters are consistent with   abnormal left ventricular relaxation (grade 1 diastolic   dysfunction). Doppler  parameters are consistent with   indeterminate ventricular filling pressure. - Aortic valve: Transvalvular velocity was within the normal range.   There was no stenosis. There was no regurgitation. - Mitral valve: There was mild regurgitation. - Left atrium: The atrium was mildly dilated. - Right ventricle: The cavity size was normal. Wall thickness was   normal. Systolic function was normal. - Atrial septum: No defect or patent foramen ovale was identified   by color flow Doppler. - Tricuspid valve: There was mild regurgitation. - Pulmonary arteries: Systolic pressure was within the normal   range. PA peak pressure: 23 mm Hg (S).  Lexiscan Myoview 11/24/17:  The left ventricular ejection fraction is moderately decreased (30-44%).  Nuclear stress EF: 36%.  There was no ST segment deviation noted during stress.  There is a large defect of severe severity present in the mid anterior, mid inferior, apical anterior, apical septal, apical inferior, apical lateral and apex location. The defect is partially reversible. This is consistent with infarct with small amount of peri infarct ischemia in the inferior and apical walls.  This is a high risk study.  Compared to prior study the EF has declined and there appears to be peri infarct ischemia in the inferior wall although this could also be variations in diaphragmatic attenuation artifact.     Recent Labs: 10/28/2017: Hemoglobin 12.4; Platelets 267; TSH 2.560    Lipid Panel    Component Value Date/Time   CHOL 195 10/28/2017 1053   TRIG 66 10/28/2017 1053   HDL 58 10/28/2017 1053   CHOLHDL 3.4 10/28/2017 1053   CHOLHDL 3.1 10/05/2016 0937   VLDL 15 10/05/2016 0937   LDLCALC 124 (H) 10/28/2017 1053      Wt Readings from Last 3 Encounters:  12/03/17 193 lb 6.4 oz (87.7 kg)  12/02/17 191 lb 3.2 oz (86.7 kg)  11/24/17 192 lb (87.1 kg)      ASSESSMENT AND PLAN:  # CAD s/p STEMI: # Angina:  Marie Tanner had ST  elevation myocardial infarction with stents placed in the LAD/left main and left  circumflex arteries.  She is on  low dose ticagrelor indefinitely.  Exercise myoview was concerning for peri-infarct ischemia in the inferior and apical regions.  She will go for [facility] with Dr. Ellyn Hack next week.  Continue aspirin, metoprolol and rosuvastatin.    # Chronic diastolic heart failure: Marie Tanner is euvolemic.  Her weight is stable. Continue furosemide and BP management.  LVEF was reduced according to [employer].  Will get echo after cath to assess.   # Hypertensive heart disease: BP elevated today but typically well-controlled on metoprolol and losartan.  Suspect the stress of needing cath has elevated her BP.    # Hyperlipidemia:  Rosuvastatin was reduced due to muscle cramping.  She still has mild cramping but her LDL is not at goal. She did not tolerate atorvastatin either.  She will see our pharmacist about PCSK9 inhibitor.   # OSA: She needs to wear her CPAP or get an oral device.     Current medicines are reviewed at length with the patient today.  The patient does not have concerns regarding medicines.  The following changes have been made: none  Labs/ tests ordered today include:   Orders Placed This Encounter  Procedures  . INR/PT  . Basic metabolic panel  . CBC with Differential/Platelet     Disposition:   FU with Pranay Hilbun C. Oval Linsey, MD, [facility] in 2 months.   This note was written with the assistance of speech recognition software.  Please excuse any transcriptional errors.  Signed, Grettell Ransdell C. Oval Linsey, MD, [facility]  12/03/2017 12:35 PM    [facility]

## 2017-12-04 LAB — BASIC METABOLIC PANEL
BUN / CREAT RATIO: 21 (ref 9–23)
BUN: 15 mg/dL (ref 6–24)
CO2: 22 mmol/L (ref 20–29)
CREATININE: 0.7 mg/dL (ref 0.57–1.00)
Calcium: 9.8 mg/dL (ref 8.7–10.2)
Chloride: 103 mmol/L (ref 96–106)
GFR, EST AFRICAN AMERICAN: 110 mL/min/{1.73_m2} (ref >59)
GFR, EST NON AFRICAN AMERICAN: 95 mL/min/{1.73_m2} (ref >59)
Glucose: 96 mg/dL (ref 65–99)
POTASSIUM: 4.6 mmol/L (ref 3.5–5.2)
SODIUM: 138 mmol/L (ref 134–144)

## 2017-12-04 LAB — CBC WITH DIFFERENTIAL/PLATELET
BASOS: 0 %
Basophils Absolute: 0 10*3/uL (ref 0.0–0.2)
EOS (ABSOLUTE): 0.1 10*3/uL (ref 0.0–0.4)
EOS: 4 %
HEMATOCRIT: 35.6 % (ref 34.0–46.6)
Hemoglobin: 12.1 g/dL (ref 11.1–15.9)
Immature Grans (Abs): 0 10*3/uL (ref 0.0–0.1)
Immature Granulocytes: 0 %
Lymphocytes Absolute: 1 10*3/uL (ref 0.7–3.1)
Lymphs: 33 %
MCH: 27.8 pg (ref 26.6–33.0)
MCHC: 34 g/dL (ref 31.5–35.7)
MCV: 82 fL (ref 79–97)
MONOS ABS: 0.5 10*3/uL (ref 0.1–0.9)
Monocytes: 17 %
NEUTROS ABS: 1.4 10*3/uL (ref 1.4–7.0)
Neutrophils: 46 %
PLATELETS: 258 10*3/uL (ref 150–379)
RBC: 4.36 x10E6/uL (ref 3.77–5.28)
RDW: 14.1 % (ref 12.3–15.4)
WBC: 3.1 10*3/uL — ABNORMAL LOW (ref 3.4–10.8)

## 2017-12-04 LAB — PROTIME-INR
INR: 1 (ref 0.8–1.2)
Prothrombin Time: 10.7 s (ref 9.1–12.0)

## 2017-12-06 ENCOUNTER — Telehealth: Payer: Self-pay

## 2017-12-06 NOTE — Telephone Encounter (Signed)
Patient contacted pre-catheterization at [facility] scheduled for:  12/07/2017 @ 1200 Verified arrival time and place:  NT @ 1000 Confirmed AM meds to be taken pre-cath with sip of water: Hold metformin- Take ASA/Brilinta Hold furosemide Confirmed patient has responsible person to drive home post procedure and observe patient for 24 hours: yes Addl concerns:  none

## 2017-12-07 ENCOUNTER — Encounter (HOSPITAL_COMMUNITY)
Admission: RE | Disposition: A | Discharge status: Home / Self Care | Payer: Self-pay | Source: Ambulatory Visit | Attending: Cardiology

## 2017-12-07 ENCOUNTER — Ambulatory Visit (HOSPITAL_COMMUNITY)
Admission: RE | Admit: 2017-12-07 | Discharge: 2017-12-07 | Disposition: A | Discharge status: Home / Self Care | Payer: BC Managed Care – PPO | Source: Ambulatory Visit | Attending: Cardiology | Admitting: Cardiology

## 2017-12-07 ENCOUNTER — Ambulatory Visit: Payer: BC Managed Care – PPO

## 2017-12-07 DIAGNOSIS — R9439 Abnormal result of other cardiovascular function study: Secondary | ICD-10-CM | POA: Diagnosis not present

## 2017-12-07 DIAGNOSIS — Z8249 Family history of ischemic heart disease and other diseases of the circulatory system: Secondary | ICD-10-CM | POA: Diagnosis not present

## 2017-12-07 DIAGNOSIS — G4733 Obstructive sleep apnea (adult) (pediatric): Secondary | ICD-10-CM | POA: Insufficient documentation

## 2017-12-07 DIAGNOSIS — Z8674 Personal history of sudden cardiac arrest: Secondary | ICD-10-CM | POA: Diagnosis not present

## 2017-12-07 DIAGNOSIS — I240 Acute coronary thrombosis not resulting in myocardial infarction: Secondary | ICD-10-CM | POA: Diagnosis present

## 2017-12-07 DIAGNOSIS — E785 Hyperlipidemia, unspecified: Secondary | ICD-10-CM | POA: Diagnosis not present

## 2017-12-07 DIAGNOSIS — E7800 Pure hypercholesterolemia, unspecified: Secondary | ICD-10-CM | POA: Diagnosis not present

## 2017-12-07 DIAGNOSIS — T82855A Stenosis of coronary artery stent, initial encounter: Secondary | ICD-10-CM | POA: Diagnosis not present

## 2017-12-07 DIAGNOSIS — Z853 Personal history of malignant neoplasm of breast: Secondary | ICD-10-CM | POA: Diagnosis not present

## 2017-12-07 DIAGNOSIS — I5032 Chronic diastolic (congestive) heart failure: Secondary | ICD-10-CM | POA: Diagnosis not present

## 2017-12-07 DIAGNOSIS — Z7982 Long term (current) use of aspirin: Secondary | ICD-10-CM | POA: Diagnosis not present

## 2017-12-07 DIAGNOSIS — E119 Type 2 diabetes mellitus without complications: Secondary | ICD-10-CM | POA: Insufficient documentation

## 2017-12-07 DIAGNOSIS — I209 Angina pectoris, unspecified: Secondary | ICD-10-CM | POA: Diagnosis present

## 2017-12-07 DIAGNOSIS — Z7984 Long term (current) use of oral hypoglycemic drugs: Secondary | ICD-10-CM | POA: Diagnosis not present

## 2017-12-07 DIAGNOSIS — I252 Old myocardial infarction: Secondary | ICD-10-CM | POA: Insufficient documentation

## 2017-12-07 DIAGNOSIS — I25119 Atherosclerotic heart disease of native coronary artery with unspecified angina pectoris: Secondary | ICD-10-CM

## 2017-12-07 DIAGNOSIS — G4730 Sleep apnea, unspecified: Secondary | ICD-10-CM | POA: Diagnosis not present

## 2017-12-07 DIAGNOSIS — Z79899 Other long term (current) drug therapy: Secondary | ICD-10-CM | POA: Diagnosis not present

## 2017-12-07 DIAGNOSIS — I2510 Atherosclerotic heart disease of native coronary artery without angina pectoris: Secondary | ICD-10-CM

## 2017-12-07 DIAGNOSIS — I10 Essential (primary) hypertension: Secondary | ICD-10-CM | POA: Diagnosis present

## 2017-12-07 DIAGNOSIS — Z86718 Personal history of other venous thrombosis and embolism: Secondary | ICD-10-CM | POA: Insufficient documentation

## 2017-12-07 DIAGNOSIS — I110 Hypertensive heart disease with heart failure: Secondary | ICD-10-CM | POA: Diagnosis not present

## 2017-12-07 DIAGNOSIS — I259 Chronic ischemic heart disease, unspecified: Secondary | ICD-10-CM | POA: Diagnosis present

## 2017-12-07 DIAGNOSIS — Y832 Surgical operation with anastomosis, bypass or graft as the cause of abnormal reaction of the patient, or of later complication, without mention of misadventure at the time of the procedure: Secondary | ICD-10-CM | POA: Insufficient documentation

## 2017-12-07 HISTORY — PX: LEFT HEART CATH AND CORONARY ANGIOGRAPHY: CATH118249

## 2017-12-07 LAB — GLUCOSE, CAPILLARY: Glucose-Capillary: 101 mg/dL — ABNORMAL HIGH (ref 65–99)

## 2017-12-07 SURGERY — LEFT HEART CATH AND CORONARY ANGIOGRAPHY
Anesthesia: LOCAL

## 2017-12-07 MED ORDER — FENTANYL CITRATE (PF) 100 MCG/2ML IJ SOLN
INTRAMUSCULAR | Status: DC | PRN
  Administered 2017-12-07: 25 ug via INTRAVENOUS

## 2017-12-07 MED ORDER — SODIUM CHLORIDE 0.9% FLUSH: 3.0000 mL | INTRAVENOUS | Status: DC | PRN

## 2017-12-07 MED ORDER — METOPROLOL SUCCINATE ER 25 MG PO TB24: 25.0000 mg | ORAL_TABLET | Freq: Every day | ORAL | Status: DC

## 2017-12-07 MED ORDER — ONDANSETRON HCL 4 MG/2ML IJ SOLN: 4.0000 mg | Freq: Four times a day (QID) | INTRAMUSCULAR | Status: DC | PRN

## 2017-12-07 MED ORDER — SODIUM CHLORIDE 0.9 % IV SOLN: 250.0000 mL | INTRAVENOUS | Status: DC | PRN

## 2017-12-07 MED ORDER — SODIUM CHLORIDE 0.9% FLUSH: 3.0000 mL | Freq: Two times a day (BID) | INTRAVENOUS | Status: DC

## 2017-12-07 MED ORDER — MIDAZOLAM HCL 2 MG/2ML IJ SOLN
INTRAMUSCULAR | Status: DC | PRN
  Administered 2017-12-07: 1 mg via INTRAVENOUS

## 2017-12-07 MED ORDER — ACETAMINOPHEN 325 MG PO TABS: 650.0000 mg | ORAL_TABLET | ORAL | Status: DC | PRN

## 2017-12-07 MED ORDER — IOPAMIDOL (ISOVUE-370) INJECTION 76%
INTRAVENOUS | Status: DC | PRN
  Administered 2017-12-07: 80 mL via INTRA_ARTERIAL

## 2017-12-07 MED ORDER — METOPROLOL TARTRATE 5 MG/5ML IV SOLN
INTRAVENOUS | Status: AC
  Filled 2017-12-07: qty 5

## 2017-12-07 MED ORDER — HEPARIN (PORCINE) IN NACL 2-0.9 UNIT/ML-% IJ SOLN
INTRAMUSCULAR | Status: AC
  Filled 2017-12-07: qty 1000

## 2017-12-07 MED ORDER — SODIUM CHLORIDE 0.9 % IV SOLN: INTRAVENOUS | Status: AC

## 2017-12-07 MED ORDER — ASPIRIN 81 MG PO CHEW: 81.0000 mg | CHEWABLE_TABLET | ORAL | Status: DC

## 2017-12-07 MED ORDER — MIDAZOLAM HCL 2 MG/2ML IJ SOLN
INTRAMUSCULAR | Status: AC
  Filled 2017-12-07: qty 2

## 2017-12-07 MED ORDER — SODIUM CHLORIDE 0.9 % IV SOLN
INTRAVENOUS | Status: DC
  Administered 2017-12-07: 11:00:00 via INTRAVENOUS

## 2017-12-07 MED ORDER — SUMATRIPTAN SUCCINATE 25 MG PO TABS: 25.0000 mg | ORAL_TABLET | ORAL | Status: DC | PRN

## 2017-12-07 MED ORDER — HEPARIN SODIUM (PORCINE) 1000 UNIT/ML IJ SOLN
INTRAMUSCULAR | Status: AC
  Filled 2017-12-07: qty 1

## 2017-12-07 MED ORDER — LORATADINE 10 MG PO TABS: 10.0000 mg | ORAL_TABLET | Freq: Every day | ORAL | Status: DC | PRN

## 2017-12-07 MED ORDER — HEPARIN SODIUM (PORCINE) 1000 UNIT/ML IJ SOLN
INTRAMUSCULAR | Status: DC | PRN
  Administered 2017-12-07: 5000 [IU] via INTRAVENOUS

## 2017-12-07 MED ORDER — HEPARIN (PORCINE) IN NACL 2-0.9 UNIT/ML-% IJ SOLN
INTRAMUSCULAR | Status: DC | PRN
  Administered 2017-12-07: 10 mL via INTRA_ARTERIAL

## 2017-12-07 MED ORDER — METOPROLOL TARTRATE 5 MG/5ML IV SOLN
INTRAVENOUS | Status: DC | PRN
  Administered 2017-12-07: 5 mg via INTRAVENOUS

## 2017-12-07 MED ORDER — HEPARIN (PORCINE) IN NACL 2-0.9 UNIT/ML-% IJ SOLN
INTRAMUSCULAR | Status: AC | PRN
  Administered 2017-12-07: 1000 mL

## 2017-12-07 MED ORDER — FENTANYL CITRATE (PF) 100 MCG/2ML IJ SOLN
INTRAMUSCULAR | Status: AC
  Filled 2017-12-07: qty 2

## 2017-12-07 MED ORDER — LIDOCAINE HCL (PF) 1 % IJ SOLN
INTRAMUSCULAR | Status: DC | PRN
  Administered 2017-12-07: 3 mL via INTRADERMAL

## 2017-12-07 MED ORDER — IOPAMIDOL (ISOVUE-370) INJECTION 76%
INTRAVENOUS | Status: AC
  Filled 2017-12-07: qty 100

## 2017-12-07 MED ORDER — HYDROCODONE-ACETAMINOPHEN 5-325 MG PO TABS
ORAL_TABLET | ORAL | Status: AC
  Filled 2017-12-07: qty 1

## 2017-12-07 MED ORDER — LIDOCAINE HCL (PF) 1 % IJ SOLN
INTRAMUSCULAR | Status: AC
  Filled 2017-12-07: qty 30

## 2017-12-07 MED ORDER — VERAPAMIL HCL 2.5 MG/ML IV SOLN
INTRAVENOUS | Status: AC
  Filled 2017-12-07: qty 2

## 2017-12-07 MED ORDER — HYDROCODONE-ACETAMINOPHEN 5-325 MG PO TABS
1.0000 | ORAL_TABLET | Freq: Four times a day (QID) | ORAL | Status: DC | PRN
  Administered 2017-12-07: 1 via ORAL

## 2017-12-07 SURGICAL SUPPLY — 13 items
CATH INFINITI 5FR ANG PIGTAIL (CATHETERS) ×2 IMPLANT
CATH OPTITORQUE TIG 4.0 5F (CATHETERS) ×2 IMPLANT
COVER PRB 48X5XTLSCP FOLD TPE (BAG) ×1 IMPLANT
COVER PROBE 5X48 (BAG) ×1
DEVICE RAD COMP TR BAND LRG (VASCULAR PRODUCTS) ×2 IMPLANT
GLIDESHEATH SLEND A-KIT 6F 22G (SHEATH) ×2 IMPLANT
GUIDEWIRE INQWIRE 1.5J.035X260 (WIRE) ×1 IMPLANT
INQWIRE 1.5J .035X260CM (WIRE) ×2
KIT HEART LEFT (KITS) ×2 IMPLANT
PACK CARDIAC CATHETERIZATION (CUSTOM PROCEDURE TRAY) ×2 IMPLANT
TRANSDUCER W/STOPCOCK (MISCELLANEOUS) ×2 IMPLANT
TUBING CIL FLEX 10 FLL-RA (TUBING) ×2 IMPLANT
WIRE HI TORQ VERSACORE-J 145CM (WIRE) ×2 IMPLANT

## 2017-12-07 NOTE — Discharge Instructions (Signed)

## 2017-12-07 NOTE — Progress Notes (Deleted)
12/07/2017 Marie Tanner July 29, 1956 [id number]   HPI:  Marie Tanner is a 62 y.o. female patient of Dr Oval Linsey, who presents today for a lipid clinic evaluation.  In addition to hyperlipidemia, her medical history is significant for STEMI with placement of 2 stents (2015), chronic diastolic HF, OSA (not on CPAP), DM, prior DVT, breast cancer and mitral regurgitation.  Recently she has begun to have episodes of chest pain  Current Medications:  Risk Factors:  Cholesterol Goals:   Intolerant/previously tried:  Family history:   Diet:   Exercise:    Labs:   09/2017:  TC 195, TG 66, HDL 58, LDL 124 (rosuvastatin 20)  Current Outpatient Medications  Medication Sig Dispense Refill  . acetaminophen (TYLENOL) 500 MG tablet Take 1,000 mg by mouth 2 (two) times daily as needed (pain).     [name] aspirin EC 81 MG tablet Take 1 tablet (81 mg total) by mouth daily.    [name] BRILINTA 60 MG TABS tablet TAKE 1 TABLET (60 MG TOTAL) BY MOUTH 2 (TWO) TIMES DAILY. 180 tablet 2  . diclofenac sodium (VOLTAREN) 1 % GEL Apply 2 g topically 4 (four) times daily. (Patient taking differently: Apply 2 g topically 4 (four) times daily as needed (pain). ) 1 Tube 3  . exemestane (AROMASIN) 25 MG tablet Take 25 mg by mouth daily after breakfast.    . furosemide (LASIX) 40 MG tablet Take 1 tablet (40 mg total) by mouth daily at 12 noon. 30 tablet   . HYDROcodone-acetaminophen (NORCO/VICODIN) 5-325 MG tablet Take 1 tablet by mouth every 6 (six) hours as needed for severe pain. 10 tablet 0  . loratadine (CLARITIN) 10 MG tablet Take 10 mg by mouth daily as needed for allergies.     [name] losartan (COZAAR) 25 MG tablet Take 25 mg by mouth daily.    . magnesium oxide (MAG-OX) 400 MG tablet Take 400 mg by mouth daily.     . metFORMIN (GLUCOPHAGE-XR) 500 MG 24 hr tablet Take 500 mg by mouth daily with breakfast.     . metoprolol succinate (TOPROL-XL) 25 MG 24 hr tablet TAKE 1 TABLET (25 MG TOTAL) BY MOUTH DAILY. 30 tablet 11  .  nitroGLYCERIN (NITROSTAT) 0.4 MG SL tablet Place 1 tablet (0.4 mg total) under the tongue every 5 (five) minutes as needed for chest pain. max of 3 if no relief call 911 25 tablet PRN  . potassium chloride SA (K-DUR,KLOR-CON) 20 MEQ tablet Take 20 mEq by mouth daily.     . rosuvastatin (CRESTOR) 20 MG tablet TAKE 1 TABLET BY MOUTH 3 TIMES PER WEEK 30 tablet 6  . SUMAtriptan (IMITREX) 25 MG tablet Take one tablet at the onset of headache. May repeat in 2 hours if headache persists or recurs. 30 tablet 3   No current facility-administered medications for this visit.     Allergies  Allergen Reactions  . Dilaudid [Hydromorphone Hcl] Anaphylaxis, Itching and Swelling  . Hydromorphone Anaphylaxis, Itching and Swelling  . Epinephrine Anxiety and Other (See Comments)    Severe anxiety  . Lactose Intolerance (Gi) Diarrhea    Past Medical History:  Diagnosis Date  . Breast cancer ([place])   . Cancer ([place])   . Coronary artery disease   . Depression   . DVT (deep venous thrombosis) ([place]) 11/15   left femerol artery injury during resusciation.  [name] GERD (gastroesophageal reflux disease)   . HCAP (healthcare-associated pneumonia) 11/22/2014  . Heart murmur   . High cholesterol   .  History of blood transfusion 09/2014   related to MI  . History of gout   . Hypertension   . Liver laceration 10/19/14  . Pulmonary edema 10/19/14  . Renal disorder   . Sleep apnea    "suppose to wear mask but I don't" (11/22/2014)  . STEMI (ST elevation myocardial infarction) ([place]) 10/19/14  . Type II diabetes mellitus ([place])     There were no vitals taken for this visit.   No problem-specific Assessment & Plan notes found for this encounter.   Tommy Medal PharmD CPP [facility]

## 2017-12-07 NOTE — Interval H&P Note (Signed)
History and Physical Interval Note:  12/07/2017 11:57 AM  Marie Tanner  has presented today for surgery, with the diagnosis of abnormal stress test with known CAD-PCI.    The various methods of treatment have been discussed with the patient and family. After consideration of risks, benefits and other options for treatment, the patient has consented to  Procedure(s): LEFT HEART CATH AND CORONARY ANGIOGRAPHY (N/A) with possible PERCUTANEOUS CORONARY INTERVENTION as a surgical intervention .  The patient's history has been reviewed, patient examined, no change in status, stable for surgery.  I have reviewed the patient's chart and labs.  Questions were answered to the patient's satisfaction.    Cath Lab Visit (complete for each Cath Lab visit)  Clinical Evaluation Leading to the Procedure:   ACS: No.  Non-ACS:    Anginal Classification: CCS III  Anti-ischemic medical therapy: Minimal Therapy (1 class of medications)  Non-Invasive Test Results: High-risk stress test findings: cardiac mortality >3%/year  Prior CABG: No previous CABG   Glenetta Hew

## 2017-12-07 NOTE — Progress Notes (Signed)
Cath lab aware that pt has 1 IV and IV team consult placed but not completed.

## 2017-12-08 ENCOUNTER — Encounter (HOSPITAL_COMMUNITY): Payer: Self-pay | Admitting: Cardiology

## 2017-12-23 ENCOUNTER — Other Ambulatory Visit: Payer: Self-pay | Admitting: Cardiovascular Disease

## 2017-12-23 NOTE — Telephone Encounter (Signed)
Refill Request.  

## 2017-12-30 NOTE — Progress Notes (Signed)
Chief Complaint  Patient presents with  . migraines    followup, per pt migraines are much better    HPI  Migraines Pt reports that she has only needed the imitrex twice in the past two weeks She reports that she does not get the headaches as often She states that she feels like she is doing much better  CAD and DM Patient had a cardiac cath on 12/07/2017 She is states that she has very few episodes of chest pains She states that she feels well overall  She reports that her Cardiologist wants her to consider a biologic for her cholesterol Lab Results  Component Value Date   [place] 124 (H) 10/28/2017   Diabetes Mellitus: Patient presents for follow up of diabetes. Symptoms: none. Symptoms have stabilized. Patient denies foot ulcerations, hyperglycemia, hypoglycemia  and nausea.  Evaluation to date has been included: hemoglobin A1C.  Home sugars: patient does not check sugars. Treatment to date: Continued metformin which has been effective.  Lab Results  Component Value Date   HGBA1C 6.3 (H) 10/28/2017      Past Medical History:  Diagnosis Date  . Breast cancer ([place])   . Cancer ([place])   . Coronary artery disease   . Depression   . DVT (deep venous thrombosis) ([place]) 11/15   left femerol artery injury during resusciation.  [name] GERD (gastroesophageal reflux disease)   . HCAP (healthcare-associated pneumonia) 11/22/2014  . Heart murmur   . High cholesterol   . History of blood transfusion 09/2014   related to MI  . History of gout   . Hypertension   . Liver laceration 10/19/14  . Pulmonary edema 10/19/14  . Renal disorder   . Sleep apnea    "suppose to wear mask but I don't" (11/22/2014)  . STEMI (ST elevation myocardial infarction) ([place]) 10/19/14  . Type II diabetes mellitus ([place])     Current Outpatient Medications  Medication Sig Dispense Refill  . acetaminophen (TYLENOL) 500 MG tablet Take 1,000 mg by mouth 2 (two) times daily as needed (pain).     [name] aspirin EC 81  MG tablet Take 1 tablet (81 mg total) by mouth daily.    [name] BRILINTA 60 MG TABS tablet TAKE 1 TABLET (60 MG TOTAL) BY MOUTH 2 (TWO) TIMES DAILY. 180 tablet 2  . diclofenac sodium (VOLTAREN) 1 % GEL Apply 2 g topically 4 (four) times daily. (Patient taking differently: Apply 2 g topically 4 (four) times daily as needed (pain). ) 1 Tube 3  . exemestane (AROMASIN) 25 MG tablet Take 25 mg by mouth daily after breakfast.    . furosemide (LASIX) 40 MG tablet Take 1 tablet (40 mg total) by mouth daily at 12 noon. 30 tablet   . HYDROcodone-acetaminophen (NORCO/VICODIN) 5-325 MG tablet Take 1 tablet by mouth every 6 (six) hours as needed for severe pain. 10 tablet 0  . loratadine (CLARITIN) 10 MG tablet Take 10 mg by mouth daily as needed for allergies.     [name] losartan (COZAAR) 25 MG tablet Take 25 mg by mouth daily.    . magnesium oxide (MAG-OX) 400 MG tablet Take 400 mg by mouth daily.     . metFORMIN (GLUCOPHAGE-XR) 500 MG 24 hr tablet Take 500 mg by mouth daily with breakfast.     . metoprolol succinate (TOPROL-XL) 25 MG 24 hr tablet TAKE 1 TABLET (25 MG TOTAL) BY MOUTH DAILY. 30 tablet 11  . nitroGLYCERIN (NITROSTAT) 0.4 MG SL tablet Place 1 tablet (0.4  mg total) under the tongue every 5 (five) minutes as needed for chest pain. max of 3 if no relief call 911 25 tablet PRN  . potassium chloride SA (K-DUR,KLOR-CON) 20 MEQ tablet Take 20 mEq by mouth daily.     . rosuvastatin (CRESTOR) 20 MG tablet TAKE 1 TABLET BY MOUTH 3 TIMES PER WEEK 30 tablet 2  . SUMAtriptan (IMITREX) 25 MG tablet Take one tablet at the onset of headache. May repeat in 2 hours if headache persists or recurs. 30 tablet 3   No current facility-administered medications for this visit.     Allergies:  Allergies  Allergen Reactions  . Dilaudid [Hydromorphone Hcl] Anaphylaxis, Itching and Swelling  . Hydromorphone Anaphylaxis, Itching and Swelling  . Epinephrine Anxiety and Other (See Comments)    Severe anxiety  . Lactose  Intolerance (Gi) Diarrhea    Past Surgical History:  Procedure Laterality Date  . ABDOMINAL HYSTERECTOMY  ~ 2000  . APPLICATION OF WOUND VAC  10/2014   "over my naval" (11/22/2014)  . BREAST LUMPECTOMY WITH AXILLARY LYMPH NODE BIOPSY    . BREAST SURGERY    . [place]; 1982  . CORONARY ANGIOPLASTY WITH STENT PLACEMENT  09/2014   "multiple"/notes 11/22/2014  . EXTRACORPOREAL CIRCULATION    . Femerol artery repair Right 09/2014   pt insists it was a right femoral artery repair on 11/22/2014  . LAPAROSCOPIC ABDOMINAL EXPLORATION    . LEFT HEART CATH AND CORONARY ANGIOGRAPHY N/A 12/07/2017   Procedure: LEFT HEART CATH AND CORONARY ANGIOGRAPHY;  Surgeon: Leonie Man, MD;  Location: [facility];  Service: Cardiovascular;  Laterality: N/A;  . LEFT VENTRICULAR ASSIST DEVICE     pt is not aware of this hx on 11/22/2014  . Liver laceration repair  09/2014   related to CPR/notes 11/22/2014  . TRACHEOSTOMY  09/2014   "closed on it's own when they took it out"  . TUBAL LIGATION  1982    Social History   Socioeconomic History  . Marital status: Divorced    Spouse name: None  . Number of children: None  . Years of education: None  . Highest education level: None  Social Needs  . Financial resource strain: None  . Food insecurity - worry: None  . Food insecurity - inability: None  . Transportation needs - medical: None  . Transportation needs - non-medical: None  Occupational History  . None  Tobacco Use  . Smoking status: Never Smoker  . Smokeless tobacco: Never Used  Substance and Sexual Activity  . Alcohol use: No  . Drug use: No  . Sexual activity: None  Other Topics Concern  . None  Social History Narrative  . None    Family History  Problem Relation Age of Onset  . Diabetes Mother   . Hypertension Mother   . COPD Mother   . Heart attack Father   . Alzheimer's disease Father   . Diabetes Brother   . Stroke Neg Hx      ROS Review of  Systems See HPI Constitution: No fevers or chills No malaise No diaphoresis Skin: No rash or itching Eyes: no blurry vision, no double vision GU: no dysuria or hematuria Neuro: no dizziness or headaches * all others reviewed and negative   Objective: Vitals:   01/03/18 0834  BP: 120/70  Pulse: 86  Resp: 16  Temp: 99.4 F (37.4 C)  TempSrc: Oral  SpO2: 95%  Weight: 189 lb 3.2 oz (85.8  kg)  Height: 5\' 2"  (1.575 m)    Physical Exam  Constitutional: She is oriented to person, place, and time. She appears well-developed and well-nourished.  HENT:  Head: Normocephalic and atraumatic.  Cardiovascular: Normal rate, regular rhythm and normal heart sounds.  No murmur heard. Pulmonary/Chest: Effort normal and breath sounds normal. No stridor. No respiratory distress.  Neurological: She is alert and oriented to person, place, and time.  Psychiatric: She has a normal mood and affect. Her behavior is normal. Judgment and thought content normal.     Her most recent catheterization showed  Mid LM to [facility] LAD DES Stent is 10% stenosed. -This entire segment is heavily calcified. There is a significant step down after the stent at the takeoff of the diagonal branches.  Prox LAD lesion is 20% stenosed.  Ost Ramus lesion is 40% stenosed. Proc Circumflex ~40%.  There is moderate left ventricular systolic dysfunction. The left ventricular ejection fraction is 35-45% by visual estimate.  LV end diastolic pressure is moderately elevated. 22-24 mmHg   There does not appear to be an obvious culprit lesion to explain inferior ischemia.  She has a large wraparound LAD and is quite likely with LAD infarct she had inferoapical hypokinesis.  There is moderate disease in the LAD, but not flow-limiting.  Visually, her EF does appear to be slightly higher than what was read on the nuclear stress test.   Assessment and Plan Torra was seen today for migraines.  Diagnoses and all orders for this  visit:  Type 2 diabetes mellitus without complication, without long-term current use of insulin ([place])-  Advised to continue her current metformin -     POCT glycosylated hemoglobin (Hb A1C)  Hx of colonic polyp- reviewed previous colonoscopy and updated health maintenance  Cardiomyopathy, ischemic- discussed current meds and most recent cath  Migraine without aura and without status migrainosus, not intractable- advised pt to continue imitrex prn  Other orders -     Cancel: Ambulatory referral to Gastroenterology -     Cancel: Tdap vaccine greater than or equal to 7yo IM     Zoe A [employer]

## 2018-01-03 ENCOUNTER — Ambulatory Visit: Payer: BC Managed Care – PPO | Admitting: Family Medicine

## 2018-01-03 ENCOUNTER — Other Ambulatory Visit: Payer: Self-pay

## 2018-01-03 ENCOUNTER — Encounter: Payer: Self-pay | Admitting: Family Medicine

## 2018-01-03 VITALS — BP 120/70 | HR 86 | Temp 99.4°F | Resp 16 | Ht 62.0 in | Wt 189.2 lb

## 2018-01-03 DIAGNOSIS — G43009 Migraine without aura, not intractable, without status migrainosus: Secondary | ICD-10-CM | POA: Diagnosis not present

## 2018-01-03 DIAGNOSIS — I255 Ischemic cardiomyopathy: Secondary | ICD-10-CM | POA: Diagnosis not present

## 2018-01-03 DIAGNOSIS — Z86010 Personal history of colonic polyps: Secondary | ICD-10-CM | POA: Diagnosis not present

## 2018-01-03 DIAGNOSIS — E119 Type 2 diabetes mellitus without complications: Secondary | ICD-10-CM | POA: Diagnosis not present

## 2018-01-03 LAB — POCT GLYCOSYLATED HEMOGLOBIN (HGB A1C): Hemoglobin A1C: 6

## 2018-01-03 NOTE — Patient Instructions (Signed)
     IF you received an x-ray today, you will receive an invoice from [facility]. Please contact [facility] at [phone] with questions or concerns regarding your invoice.   IF you received labwork today, you will receive an invoice from [facility]. Please contact [facility] at [phone] with questions or concerns regarding your invoice.   Our billing staff will not be able to assist you with questions regarding bills from these companies.  You will be contacted with the lab results as soon as they are available. The fastest way to get your results is to activate your My Chart account. Instructions are located on the last page of this paperwork. If you have not heard from us regarding the results in 2 weeks, please contact this office.     

## 2018-02-09 ENCOUNTER — Ambulatory Visit: Payer: BC Managed Care – PPO | Admitting: Cardiovascular Disease

## 2018-02-09 ENCOUNTER — Encounter: Payer: Self-pay | Admitting: Cardiovascular Disease

## 2018-02-09 VITALS — BP 131/80 | HR 72 | Ht 62.0 in | Wt 191.4 lb

## 2018-02-09 DIAGNOSIS — Z5181 Encounter for therapeutic drug level monitoring: Secondary | ICD-10-CM

## 2018-02-09 DIAGNOSIS — I10 Essential (primary) hypertension: Secondary | ICD-10-CM | POA: Diagnosis not present

## 2018-02-09 DIAGNOSIS — I5042 Chronic combined systolic (congestive) and diastolic (congestive) heart failure: Secondary | ICD-10-CM

## 2018-02-09 DIAGNOSIS — E7800 Pure hypercholesterolemia, unspecified: Secondary | ICD-10-CM

## 2018-02-09 DIAGNOSIS — G4733 Obstructive sleep apnea (adult) (pediatric): Secondary | ICD-10-CM

## 2018-02-09 DIAGNOSIS — Z955 Presence of coronary angioplasty implant and graft: Secondary | ICD-10-CM

## 2018-02-09 DIAGNOSIS — I110 Hypertensive heart disease with heart failure: Secondary | ICD-10-CM

## 2018-02-09 DIAGNOSIS — I5032 Chronic diastolic (congestive) heart failure: Secondary | ICD-10-CM

## 2018-02-09 LAB — COMPREHENSIVE METABOLIC PANEL
ALT: 21 IU/L (ref 0–32)
AST: 20 IU/L (ref 0–40)
Albumin/Globulin Ratio: 1.1 — ABNORMAL LOW (ref 1.2–2.2)
Albumin: 4.2 g/dL (ref 3.5–5.5)
Alkaline Phosphatase: 83 IU/L (ref 39–117)
BUN/Creatinine Ratio: 20 (ref 9–23)
BUN: 16 mg/dL (ref 6–24)
Bilirubin Total: 0.7 mg/dL (ref 0.0–1.2)
CALCIUM: 9.6 mg/dL (ref 8.7–10.2)
CO2: 23 mmol/L (ref 20–29)
CREATININE: 0.8 mg/dL (ref 0.57–1.00)
Chloride: 104 mmol/L (ref 96–106)
GFR, EST AFRICAN AMERICAN: 93 mL/min/{1.73_m2} (ref >59)
GFR, EST NON AFRICAN AMERICAN: 81 mL/min/{1.73_m2} (ref >59)
GLOBULIN, TOTAL: 4 g/dL (ref 1.5–4.5)
Glucose: 85 mg/dL (ref 65–99)
Potassium: 4.8 mmol/L (ref 3.5–5.2)
SODIUM: 140 mmol/L (ref 134–144)
TOTAL PROTEIN: 8.2 g/dL (ref 6.0–8.5)

## 2018-02-09 MED ORDER — PANTOPRAZOLE SODIUM 40 MG PO TBEC: 40.0000 mg | DELAYED_RELEASE_TABLET | Freq: Every day | ORAL | 11 refills | Status: DC

## 2018-02-09 MED ORDER — ISOSORB DINITRATE-HYDRALAZINE 20-37.5 MG PO TABS: 1.0000 | ORAL_TABLET | Freq: Two times a day (BID) | ORAL | 5 refills | Status: DC

## 2018-02-09 NOTE — Patient Instructions (Signed)
Medication Instructions:  START BIDIL TWICE A DAY   RX FOR PANTOPRAZOLE 40 MG DAILY GIVEN TO YOU IF YOU DECIDE TO GET FILLED   Labwork: CMET TODAY   Testing/Procedures: NONE  Follow-Up: Your physician recommends that you schedule a follow-up appointment in: 6 WEEKS   If you need a refill on your cardiac medications before your next appointment, please call your pharmacy.

## 2018-02-09 NOTE — Progress Notes (Signed)
Cardiology Office Note   Date:  02/09/2018   ID:  [name], DOB [birth year], MRN [id number]  PCP:  Forrest Moron, MD  Cardiologist:   Skeet Latch, MD  Oncologist: Samuel Bouche, MD Radiation Oncologist: Ladona Horns, MD Surgical Oncologist: Nancie Neas, MD  Chief Complaint  Patient presents with  . Follow-up    History of Present Illness: Marie Tanner is a 62 y.o. female with CAD s/p STEMI, hypertension, chronic systolic and diastolic heart failure, OSA not on CPAP, diabetes, prior DVT, breast cancer, and mitral regurgitation who pres Ients for follow up.  In 2015 she had a STEMI and cardiac arrest. She was cared for at [facility]. She ultimately received 2 DES (LM/LAD and LCx).  She had a prolonged hospitalization requiring tracheostomy.  Her liver was lacerated during CPR.  She required surgical repair after femoral catheterization.  She also developed a DVT in the hospital.  One week after discharge she developed pneumonia and was admitted at [facility].  She has been seen in the hospital twice for chest pain and ruled out for MI.  She had a negative Myoview 05/2015.  She also had negative CT scans for PE.  [name] also underwent surgery for breast cancer on 02/21/16.  She completed her radiation in September 2017.  She was referred for sleep study which was abnormal.  However, she declined using the CPAP device and is in the process of being fitted for an oral airway.  [name] had an echo 06/2016 that showed LVEF 50-55% with grade 1 diastolic dysfunction.    [name] reported an episode of chest pain that occurred while driving.  She was referred for [facility] 11/24/17 that revealed LVEF 36% with anterior, anteroseptal, apical inferior and apical lateral defects.  There was concern for infarct with peri-infarct ischemia in the inferior and apical walls.  She underwent LHC 11/2017 that revealed patent stents that were heavily calcified and there was a  step down after the takeoff of the diagonal.  She had 40% RI, 40% proximal LCx and 20% prox LAD.  LEF was 35-45% and LVEDP was elevated.  There were no lesions to explain inferior ischemia.  She continues to have intermittent chest pressure.  Sometimes occurs when sitting, walking, or lying down.  There is no associated shortness of breath.  In the past she struggled with acid reflux but has not noted any recently.  She eats her last meal at 730 and does not lie down until approximately 10 PM.  She also reports intermittent episodes of chest fluttering that lasts for a couple seconds at a time.  She pressure at home and it typically is in the 130s checks her blood is sometimes 140s.  The lowest has been in the upper 120s.  Past Medical History:  Diagnosis Date  . Breast cancer ([name])   . Cancer ([name])   . Coronary artery disease   . Depression   . DVT (deep venous thrombosis) ([name]) 11/15   left femerol artery injury during resusciation.  [name] GERD (gastroesophageal reflux disease)   . HCAP (healthcare-associated pneumonia) 11/22/2014  . Heart murmur   . High cholesterol   . History of blood transfusion 09/2014   related to MI  . History of gout   . Hypertension   . Liver laceration 10/19/14  . Pulmonary edema 10/19/14  . Renal disorder   . Sleep apnea    "suppose to wear mask but I don't" (11/22/2014)  .  STEMI (ST elevation myocardial infarction) ([place]) 10/19/14  . Type II diabetes mellitus ([name])     Past Surgical History:  Procedure Laterality Date  . ABDOMINAL HYSTERECTOMY  ~ 2000  . APPLICATION OF WOUND VAC  10/2014   "over my naval" (11/22/2014)  . BREAST LUMPECTOMY WITH AXILLARY LYMPH NODE BIOPSY    . BREAST SURGERY    . [place]; 1982  . CORONARY ANGIOPLASTY WITH STENT PLACEMENT  09/2014   "multiple"/notes 11/22/2014  . EXTRACORPOREAL CIRCULATION    . Femerol artery repair Right 09/2014   pt insists it was a right femoral artery repair on 11/22/2014  .  LAPAROSCOPIC ABDOMINAL EXPLORATION    . LEFT HEART CATH AND CORONARY ANGIOGRAPHY N/A 12/07/2017   Procedure: LEFT HEART CATH AND CORONARY ANGIOGRAPHY;  Surgeon: Leonie Man, MD;  Location: [place] [facility];  Service: Cardiovascular;  Laterality: N/A;  . LEFT VENTRICULAR ASSIST DEVICE     pt is not aware of this hx on 11/22/2014  . Liver laceration repair  09/2014   related to CPR/notes 11/22/2014  . TRACHEOSTOMY  09/2014   "closed on it's own when they took it out"  . TUBAL LIGATION  1982     Current Outpatient Medications  Medication Sig Dispense Refill  . acetaminophen (TYLENOL) 500 MG tablet Take 1,000 mg by mouth 2 (two) times daily as needed (pain).     [name] aspirin EC 81 MG tablet Take 1 tablet (81 mg total) by mouth daily.    [name] BRILINTA 60 MG TABS tablet TAKE 1 TABLET (60 MG TOTAL) BY MOUTH 2 (TWO) TIMES DAILY. 180 tablet 2  . diclofenac sodium (VOLTAREN) 1 % GEL Apply 2 g topically 4 (four) times daily. (Patient taking differently: Apply 2 g topically 4 (four) times daily as needed (pain). ) 1 Tube 3  . exemestane (AROMASIN) 25 MG tablet Take 25 mg by mouth daily after breakfast.    . furosemide (LASIX) 40 MG tablet Take 1 tablet (40 mg total) by mouth daily at 12 noon. 30 tablet   . HYDROcodone-acetaminophen (NORCO/VICODIN) 5-325 MG tablet Take 1 tablet by mouth every 6 (six) hours as needed for severe pain. 10 tablet 0  . loratadine (CLARITIN) 10 MG tablet Take 10 mg by mouth daily as needed for allergies.     [name] losartan (COZAAR) 25 MG tablet Take 25 mg by mouth daily.    . magnesium oxide (MAG-OX) 400 MG tablet Take 400 mg by mouth daily.     . metFORMIN (GLUCOPHAGE-XR) 500 MG 24 hr tablet Take 500 mg by mouth daily with breakfast.     . metoprolol succinate (TOPROL-XL) 25 MG 24 hr tablet TAKE 1 TABLET (25 MG TOTAL) BY MOUTH DAILY. 30 tablet 11  . nitroGLYCERIN (NITROSTAT) 0.4 MG SL tablet Place 1 tablet (0.4 mg total) under the tongue every 5 (five) minutes as needed for  chest pain. max of 3 if no relief call 911 25 tablet PRN  . potassium chloride SA (K-DUR,KLOR-CON) 20 MEQ tablet Take 20 mEq by mouth daily.     . rosuvastatin (CRESTOR) 20 MG tablet TAKE 1 TABLET BY MOUTH 3 TIMES PER WEEK 30 tablet 2  . SUMAtriptan (IMITREX) 25 MG tablet Take one tablet at the onset of headache. May repeat in 2 hours if headache persists or recurs. 30 tablet 3  . isosorbide-hydrALAZINE (BIDIL) 20-37.5 MG tablet Take 1 tablet by mouth 2 (two) times daily. 60 tablet 5  . pantoprazole (PROTONIX) 40  MG tablet Take 1 tablet (40 mg total) by mouth daily. 30 tablet 11   No current facility-administered medications for this visit.     Allergies:   Dilaudid [hydromorphone hcl]; Hydromorphone; Epinephrine; and Lactose intolerance (gi)    Social History:  The patient  reports that  has never smoked. she has never used smokeless tobacco. She reports that she does not drink alcohol or use drugs.   Family History:  The patient's family history includes Alzheimer's disease in her father; COPD in her mother; Diabetes in her brother and mother; Heart attack in her father; Hypertension in her mother.    ROS:  Please see the history of present illness.   Otherwise, review of systems are positive for none.   All other systems are reviewed and negative.    PHYSICAL EXAM: VS:  BP 131/80   Pulse 72   Ht 5\' 2"  (1.575 m)   Wt 191 lb 6.4 oz (86.8 kg)   BMI 35.01 kg/m  , BMI Body mass index is 35.01 kg/m. GENERAL:  Well appearing HEENT: Pupils equal round and reactive, fundi not visualized, oral mucosa unremarkable NECK:  No jugular venous distention, waveform within normal limits, carotid upstroke brisk and symmetric, no bruits, no thyromegaly LYMPHATICS:  No cervical adenopathy LUNGS:  Clear to auscultation bilaterally HEART:  RRR.  PMI not displaced or sustained,S1 and S2 within normal limits, no S3, no S4, no clicks, no rubs, II/VI systolic murmur at the LUSB ABD:  Flat, positive bowel  sounds normal in frequency in pitch, no bruits, no rebound, no guarding, no midline pulsatile mass, no hepatomegaly, no splenomegaly EXT:  2 plus pulses throughout, no edema, no cyanosis no clubbing SKIN:  No rashes no nodules NEURO:  Cranial nerves II through XII grossly intact, motor grossly intact throughout PSYCH:  Cognitively intact, oriented to person place and time    EKG:  EKG is ordered today. 02/11/16: Sinus arrhythmia.  Rate 71 bpm. Non-specific ST-T changes. 10/05/16: Sinus rhythm. Rate 85 bpm. Prior inferior infarct. 11/16/17: Sinus rhythm.  Rate 87 bpm.  Prior inferior infarct.    Echo 07/01/16: Study Conclusions  - Left ventricle: The cavity size was normal. Systolic function was   normal. The estimated ejection fraction was in the range of 50%   to 55%. Wall motion was normal; there were no regional wall   motion abnormalities. Doppler parameters are consistent with   abnormal left ventricular relaxation (grade 1 diastolic   dysfunction). Doppler parameters are consistent with   indeterminate ventricular filling pressure. - Aortic valve: Transvalvular velocity was within the normal range.   There was no stenosis. There was no regurgitation. - Mitral valve: There was mild regurgitation. - Left atrium: The atrium was mildly dilated. - Right ventricle: The cavity size was normal. Wall thickness was   normal. Systolic function was normal. - Atrial septum: No defect or patent foramen ovale was identified   by color flow Doppler. - Tricuspid valve: There was mild regurgitation. - Pulmonary arteries: Systolic pressure was within the normal   range. PA peak pressure: 23 mm Hg (S).  Lexiscan Myoview 11/24/17:  The left ventricular ejection fraction is moderately decreased (30-44%).  Nuclear stress EF: 36%.  There was no ST segment deviation noted during stress.  There is a large defect of severe severity present in the mid anterior, mid inferior, apical anterior,  apical septal, apical inferior, apical lateral and apex location. The defect is partially reversible. This is consistent with infarct with  small amount of peri infarct ischemia in the inferior and apical walls.  This is a high risk study.  Compared to prior study the EF has declined and there appears to be peri infarct ischemia in the inferior wall although this could also be variations in diaphragmatic attenuation artifact.   LHC 12/07/17:  Mid LM to Ost LAD DES Stent is 10% stenosed. -This entire segment is heavily calcified. There is a significant step down after the stent at the takeoff of the diagonal branches.  Prox LAD lesion is 20% stenosed.  Ost Ramus lesion is 40% stenosed. Proc Circumflex ~40%.  There is moderate left ventricular systolic dysfunction. The left ventricular ejection fraction is 35-45% by visual estimate.  LV end diastolic pressure is moderately elevated. 22-24 mmHg  Recent Labs: 10/28/2017: TSH 2.560 12/03/2017: BUN 15; Creatinine, Ser 0.70; Hemoglobin 12.1; Platelets 258; Potassium 4.6; Sodium 138    Lipid Panel    Component Value Date/Time   CHOL 195 10/28/2017 1053   TRIG 66 10/28/2017 1053   HDL 58 10/28/2017 1053   CHOLHDL 3.4 10/28/2017 1053   CHOLHDL 3.1 10/05/2016 0937   VLDL 15 10/05/2016 0937   LDLCALC 124 (H) 10/28/2017 1053      Wt Readings from Last 3 Encounters:  02/09/18 191 lb 6.4 oz (86.8 kg)  01/03/18 189 lb 3.2 oz (85.8 kg)  12/07/17 186 lb (84.4 kg)      ASSESSMENT AND PLAN:  # CAD s/p STEMI: # Chest pain: Ms. Deoni Cosey had an ST elevation myocardial infarction with stents placed in the LAD/left main and left circumflex arteries.  She is on  low dose ticagrelor indefinitely.  LHC showed patent stents with 20-40% lesions.  Her chest pain is not coming from obstructive CAD.  Recommend treatment of her OSA.  It is also possible that this is GERD.  She will consider a trial of PPI.  # Chronic systolic and diastolic heart  failure: LVEF 35-40%, consistent with her echo 05/2015 but lower than reported in 06/2016.  Continue metoprolol and losartan.  She appears euvolemic on her current dose of lasix.  We will add Bidil 1 tab bid for elevated LVEDP.    # Hypertensive heart disease: BP elevated today but typically well-controlled on metoprolol and losartan.  Suspect the stress of needing cath has elevated her BP.    # Hyperlipidemia:  Rosuvastatin was reduced due to muscle cramping.  Symptoms are better but still present.  Repeat lipids and CMP.  Consider PCSK9 inhibitor.    # OSA: In process of getting oral device.     Current medicines are reviewed at length with the patient today.  The patient does not have concerns regarding medicines.  The following changes have been made: none  Labs/ tests ordered today include:   Orders Placed This Encounter  Procedures  . Comprehensive metabolic panel     Disposition:   FU with Troye Hiemstra C. Oval Linsey, MD, [facility] in 6 weeks.    This note was written with the assistance of speech recognition software.  Please excuse any transcriptional errors.  Signed, Dequincy Born C. Oval Linsey, MD, [facility]  02/09/2018 10:12 AM    [place]

## 2018-02-20 ENCOUNTER — Other Ambulatory Visit: Payer: Self-pay | Admitting: Cardiovascular Disease

## 2018-02-21 NOTE — Telephone Encounter (Signed)
Please review for refill, thanks ! 

## 2018-03-17 ENCOUNTER — Other Ambulatory Visit: Payer: Self-pay | Admitting: Family Medicine

## 2018-03-17 MED ORDER — MAGNESIUM OXIDE 400 MG PO TABS: 400.0000 mg | ORAL_TABLET | Freq: Every day | ORAL | 1 refills | Status: DC

## 2018-03-17 NOTE — Telephone Encounter (Signed)
Copied from [place] [id number]. Topic: Quick Communication - See Telephone Encounter >> Mar 17, 2018 11:29 AM Hewitt Shorts wrote: CRM for notification. See Telephone encounter for: 03/17/18.patient is needing refill on magnesium   [facility] number [phone]

## 2018-03-17 NOTE — Telephone Encounter (Signed)
Magnesium LOV: 01/03/18 PCP: Dr Carman Ching Pharmacy: [facility]

## 2018-04-04 ENCOUNTER — Encounter: Payer: BC Managed Care – PPO | Admitting: Family Medicine

## 2018-04-05 ENCOUNTER — Encounter: Payer: Self-pay | Admitting: Cardiovascular Disease

## 2018-04-05 ENCOUNTER — Ambulatory Visit: Payer: BC Managed Care – PPO | Admitting: Cardiovascular Disease

## 2018-04-05 VITALS — BP 108/62 | HR 62 | Ht 62.0 in | Wt 189.2 lb

## 2018-04-05 DIAGNOSIS — E7800 Pure hypercholesterolemia, unspecified: Secondary | ICD-10-CM | POA: Diagnosis not present

## 2018-04-05 DIAGNOSIS — I5042 Chronic combined systolic (congestive) and diastolic (congestive) heart failure: Secondary | ICD-10-CM

## 2018-04-05 DIAGNOSIS — Z955 Presence of coronary angioplasty implant and graft: Secondary | ICD-10-CM

## 2018-04-05 DIAGNOSIS — I10 Essential (primary) hypertension: Secondary | ICD-10-CM | POA: Diagnosis not present

## 2018-04-05 DIAGNOSIS — Z5181 Encounter for therapeutic drug level monitoring: Secondary | ICD-10-CM

## 2018-04-05 DIAGNOSIS — R079 Chest pain, unspecified: Secondary | ICD-10-CM

## 2018-04-05 LAB — COMPREHENSIVE METABOLIC PANEL
ALK PHOS: 83 IU/L (ref 39–117)
ALT: 11 IU/L (ref 0–32)
AST: 17 IU/L (ref 0–40)
Albumin/Globulin Ratio: 1.1 — ABNORMAL LOW (ref 1.2–2.2)
Albumin: 4.2 g/dL (ref 3.5–5.5)
BILIRUBIN TOTAL: 0.6 mg/dL (ref 0.0–1.2)
BUN / CREAT RATIO: 19 (ref 9–23)
BUN: 13 mg/dL (ref 6–24)
CO2: 21 mmol/L (ref 20–29)
Calcium: 9.5 mg/dL (ref 8.7–10.2)
Chloride: 104 mmol/L (ref 96–106)
Creatinine, Ser: 0.68 mg/dL (ref 0.57–1.00)
GFR calc Af Amer: 111 mL/min/{1.73_m2} (ref >59)
GFR, EST NON AFRICAN AMERICAN: 96 mL/min/{1.73_m2} (ref >59)
GLUCOSE: 92 mg/dL (ref 65–99)
Globulin, Total: 3.7 g/dL (ref 1.5–4.5)
POTASSIUM: 4.7 mmol/L (ref 3.5–5.2)
SODIUM: 140 mmol/L (ref 134–144)
TOTAL PROTEIN: 7.9 g/dL (ref 6.0–8.5)

## 2018-04-05 LAB — LIPID PANEL
Chol/HDL Ratio: 3.2 ratio (ref 0.0–4.4)
Cholesterol, Total: 161 mg/dL (ref 100–199)
HDL: 50 mg/dL (ref >39)
LDL CALC: 98 mg/dL (ref 0–99)
Triglycerides: 63 mg/dL (ref 0–149)
VLDL Cholesterol Cal: 13 mg/dL (ref 5–40)

## 2018-04-05 MED ORDER — FUROSEMIDE 40 MG PO TABS: ORAL_TABLET | ORAL | 1 refills | Status: DC

## 2018-04-05 NOTE — Progress Notes (Signed)
Cardiology Office Note   Date:  04/05/2018   ID:  Marie Tanner, DOB Mar 28, 1956, MRN [id number]  PCP:  Forrest Moron, MD  Cardiologist:   Skeet Latch, MD  Oncologist: Samuel Bouche, MD Radiation Oncologist: Ladona Horns, MD Surgical Oncologist: Nancie Neas, MD  No chief complaint on file.   History of Present Illness: Marie Tanner is a 62 y.o. female with CAD s/p STEMI, hypertension, chronic systolic and diastolic heart failure, OSA not on CPAP, diabetes, prior DVT, breast cancer, and mitral regurgitation who pres Ients for follow up.  In 2015 she had a STEMI and cardiac arrest. She was cared for at [facility]. She ultimately received 2 DES (LM/LAD and LCx).  She had a prolonged hospitalization requiring tracheostomy.  Her liver was lacerated during CPR.  She required surgical repair after femoral catheterization.  She also developed a DVT in the hospital.  One week after discharge she developed pneumonia and was admitted at [facility].  She has been seen in the hospital twice for chest pain and ruled out for MI.  She had a negative Myoview 05/2015.  She also had negative CT scans for PE.  Marie Tanner also underwent surgery for breast cancer on 02/21/16.  She completed her radiation in September 2017.  She was referred for sleep study which was abnormal.  However, she declined using the CPAP device and is in the process of being fitted for an oral airway.  Marie Tanner had an echo 06/2016 that showed LVEF 50-55% with grade 1 diastolic dysfunction.    Marie Tanner reported an episode of chest pain that occurred while driving.  She was referred for [facility] 11/24/17 that revealed LVEF 36% with anterior, anteroseptal, apical inferior and apical lateral defects.  There was concern for infarct with peri-infarct ischemia in the inferior and apical walls.  She underwent LHC 11/2017 that revealed patent stents that were heavily calcified and there was a step down after the takeoff  of the diagonal.  She had 40% RI, 40% proximal LCx and 20% prox LAD.  LVEF was 35-45% and LVEDP was elevated.  At her last appointment BiDil was added to her regimen for afterload reduction and poorly controlled hypertension.  Since making that addition her blood pressure has been much better controlled.  She thinks her shortness of breath is a little bit better.  She has episodes of dyspnea less often.  However she still continues to have intermittent episodes that occur randomly either while she is just sitting or sometimes when walking.  It seems to coincide with when she has ankle edema.  Her weight has been up and down.  She limits her salt intake and eats mostly at home.  She has mild lightheadedness and dizziness but no syncope.  She denies chest pain or pressure.     Past Medical History:  Diagnosis Date  . Breast cancer ([place])   . Cancer ([place])   . Coronary artery disease   . Depression   . DVT (deep venous thrombosis) ([place]) 11/15   left femerol artery injury during resusciation.  [name] GERD (gastroesophageal reflux disease)   . HCAP (healthcare-associated pneumonia) 11/22/2014  . Heart murmur   . High cholesterol   . History of blood transfusion 09/2014   related to MI  . History of gout   . Hypertension   . Liver laceration 10/19/14  . Pulmonary edema 10/19/14  . Renal disorder   . Sleep apnea    "suppose to wear  mask but I don't" (11/22/2014)  . STEMI (ST elevation myocardial infarction) ([place]) 10/19/14  . Type II diabetes mellitus ([place])     Past Surgical History:  Procedure Laterality Date  . ABDOMINAL HYSTERECTOMY  ~ 2000  . APPLICATION OF WOUND VAC  10/2014   "over my naval" (11/22/2014)  . BREAST LUMPECTOMY WITH AXILLARY LYMPH NODE BIOPSY    . BREAST SURGERY    . [facility]; 1982  . CORONARY ANGIOPLASTY WITH STENT PLACEMENT  09/2014   "multiple"/notes 11/22/2014  . EXTRACORPOREAL CIRCULATION    . Femerol artery repair Right 09/2014   pt insists it was a  right femoral artery repair on 11/22/2014  . LAPAROSCOPIC ABDOMINAL EXPLORATION    . LEFT HEART CATH AND CORONARY ANGIOGRAPHY N/A 12/07/2017   Procedure: LEFT HEART CATH AND CORONARY ANGIOGRAPHY;  Surgeon: Leonie Man, MD;  Location: [place] [facility];  Service: Cardiovascular;  Laterality: N/A;  . LEFT VENTRICULAR ASSIST DEVICE     pt is not aware of this hx on 11/22/2014  . Liver laceration repair  09/2014   related to CPR/notes 11/22/2014  . TRACHEOSTOMY  09/2014   "closed on it's own when they took it out"  . TUBAL LIGATION  1982     Current Outpatient Medications  Medication Sig Dispense Refill  . acetaminophen (TYLENOL) 500 MG tablet Take 1,000 mg by mouth 2 (two) times daily as needed (pain).     [name] aspirin EC 81 MG tablet Take 1 tablet (81 mg total) by mouth daily.    [name] BRILINTA 60 MG TABS tablet TAKE 1 TABLET (60 MG TOTAL) BY MOUTH 2 (TWO) TIMES DAILY. 180 tablet 2  . diclofenac sodium (VOLTAREN) 1 % GEL Apply 2 g topically 4 (four) times daily. (Patient taking differently: Apply 2 g topically 4 (four) times daily as needed (pain). ) 1 Tube 3  . exemestane (AROMASIN) 25 MG tablet Take 25 mg by mouth daily after breakfast.    . furosemide (LASIX) 40 MG tablet TAKE 1 TABLET AS NEEDED FOR SHORTNESS OF BREATH OR WEIGHT GAIN OF 2 POUNDS IN 24 HOURS OR 5 POUNDS IN 1 WEEK 30 tablet 1  . HYDROcodone-acetaminophen (NORCO/VICODIN) 5-325 MG tablet Take 1 tablet by mouth every 6 (six) hours as needed for severe pain. 10 tablet 0  . isosorbide-hydrALAZINE (BIDIL) 20-37.5 MG tablet Take 1 tablet by mouth 2 (two) times daily. 60 tablet 5  . loratadine (CLARITIN) 10 MG tablet Take 10 mg by mouth daily as needed for allergies.     [name] losartan (COZAAR) 25 MG tablet Take 25 mg by mouth daily.    . magnesium oxide (MAG-OX) 400 MG tablet Take 1 tablet (400 mg total) by mouth daily. 30 tablet 1  . metFORMIN (GLUCOPHAGE-XR) 500 MG 24 hr tablet Take 500 mg by mouth daily with breakfast.     .  metoprolol succinate (TOPROL-XL) 25 MG 24 hr tablet TAKE 1 TABLET (25 MG TOTAL) BY MOUTH DAILY. 30 tablet 11  . nitroGLYCERIN (NITROSTAT) 0.4 MG SL tablet Place 1 tablet (0.4 mg total) under the tongue every 5 (five) minutes as needed for chest pain. max of 3 if no relief call 911 25 tablet PRN  . pantoprazole (PROTONIX) 40 MG tablet Take 1 tablet (40 mg total) by mouth daily. 30 tablet 11  . potassium chloride SA (K-DUR,KLOR-CON) 20 MEQ tablet Take 20 mEq by mouth daily.     . rosuvastatin (CRESTOR) 20 MG tablet TAKE 1 TABLET BY  MOUTH 3 TIMES PER WEEK 30 tablet 2  . SUMAtriptan (IMITREX) 25 MG tablet Take one tablet at the onset of headache. May repeat in 2 hours if headache persists or recurs. 30 tablet 3   No current facility-administered medications for this visit.     Allergies:   Dilaudid [hydromorphone hcl]; Hydromorphone; Epinephrine; and Lactose intolerance (gi)    Social History:  The patient  reports that she has never smoked. She has never used smokeless tobacco. She reports that she does not drink alcohol or use drugs.   Family History:  The patient's family history includes Alzheimer's disease in her father; COPD in her mother; Diabetes in her brother and mother; Heart attack in her father; Hypertension in her mother.    ROS:  Please see the history of present illness.   Otherwise, review of systems are positive for none.   All other systems are reviewed and negative.    PHYSICAL EXAM: VS:  BP 108/62   Pulse 62   Ht 5\' 2"  (1.575 m)   Wt 189 lb 3.2 oz (85.8 kg)   SpO2 98%   BMI 34.61 kg/m  , BMI Body mass index is 34.61 kg/m. GENERAL:  Well appearing HEENT: Pupils equal round and reactive, fundi not visualized, oral mucosa unremarkable NECK:  No jugular venous distention, waveform within normal limits, carotid upstroke brisk and symmetric, no bruits, no thyromegaly LYMPHATICS:  No cervical adenopathy LUNGS:  Clear to auscultation bilaterally HEART:  RRR.  PMI not  displaced or sustained,S1 and S2 within normal limits, no S3, no S4, no clicks, no rubs, II/VI systolic murmurs ABD:  Flat, positive bowel sounds normal in frequency in pitch, no bruits, no rebound, no guarding, no midline pulsatile mass, no hepatomegaly, no splenomegaly EXT:  2 plus pulses throughout, no edema, no cyanosis no clubbing SKIN:  No rashes no nodules NEURO:  Cranial nerves II through XII grossly intact, motor grossly intact throughout PSYCH:  Cognitively intact, oriented to person place and time   EKG:  EKG is ordered today. 02/11/16: Sinus arrhythmia.  Rate 71 bpm. Non-specific ST-T changes. 10/05/16: Sinus rhythm. Rate 85 bpm. Prior inferior infarct. 11/16/17: Sinus rhythm.  Rate 87 bpm.  Prior inferior infarct.    Echo 07/01/16: Study Conclusions  - Left ventricle: The cavity size was normal. Systolic function was   normal. The estimated ejection fraction was in the range of 50%   to 55%. Wall motion was normal; there were no regional wall   motion abnormalities. Doppler parameters are consistent with   abnormal left ventricular relaxation (grade 1 diastolic   dysfunction). Doppler parameters are consistent with   indeterminate ventricular filling pressure. - Aortic valve: Transvalvular velocity was within the normal range.   There was no stenosis. There was no regurgitation. - Mitral valve: There was mild regurgitation. - Left atrium: The atrium was mildly dilated. - Right ventricle: The cavity size was normal. Wall thickness was   normal. Systolic function was normal. - Atrial septum: No defect or patent foramen ovale was identified   by color flow Doppler. - Tricuspid valve: There was mild regurgitation. - Pulmonary arteries: Systolic pressure was within the normal   range. PA peak pressure: 23 mm Hg (S).  Lexiscan Myoview 11/24/17:  The left ventricular ejection fraction is moderately decreased (30-44%).  Nuclear stress EF: 36%.  There was no ST segment  deviation noted during stress.  There is a large defect of severe severity present in the mid anterior, mid inferior,  apical anterior, apical septal, apical inferior, apical lateral and apex location. The defect is partially reversible. This is consistent with infarct with small amount of peri infarct ischemia in the inferior and apical walls.  This is a high risk study.  Compared to prior study the EF has declined and there appears to be peri infarct ischemia in the inferior wall although this could also be variations in diaphragmatic attenuation artifact.   LHC 12/07/17:  Mid LM to Ost LAD DES Stent is 10% stenosed. -This entire segment is heavily calcified. There is a significant step down after the stent at the takeoff of the diagonal branches.  Prox LAD lesion is 20% stenosed.  Ost Ramus lesion is 40% stenosed. Proc Circumflex ~40%.  There is moderate left ventricular systolic dysfunction. The left ventricular ejection fraction is 35-45% by visual estimate.  LV end diastolic pressure is moderately elevated. 22-24 mmHg  Recent Labs: 10/28/2017: TSH 2.560 12/03/2017: Hemoglobin 12.1; Platelets 258 02/09/2018: ALT 21; BUN 16; Creatinine, Ser 0.80; Potassium 4.8; Sodium 140    Lipid Panel    Component Value Date/Time   CHOL 195 10/28/2017 1053   TRIG 66 10/28/2017 1053   HDL 58 10/28/2017 1053   CHOLHDL 3.4 10/28/2017 1053   CHOLHDL 3.1 10/05/2016 0937   VLDL 15 10/05/2016 0937   LDLCALC 124 (H) 10/28/2017 1053      Wt Readings from Last 3 Encounters:  04/05/18 189 lb 3.2 oz (85.8 kg)  02/09/18 191 lb 6.4 oz (86.8 kg)  01/03/18 189 lb 3.2 oz (85.8 kg)      ASSESSMENT AND PLAN:  # CAD s/p STEMI: # Chest pain: Marie Tanner had an ST elevation myocardial infarction with stents placed in the LAD/left main and left circumflex arteries.  She is on  low dose ticagrelor indefinitely.  LHC 11/2017 showed patent stents with 20-40% lesions.  Chest pain resolved.  Continue  aspirin, ticagrelor, Bidil, rosuvastatin, and metoprolol.  Encouraged increased exercise.   # Chronic systolic and diastolic heart failure: LVEF 35-40%, consistent with her echo 05/2015 but lower than reported in 06/2016.  Continue metoprolol, Bidil, and losartan.  She appears euvolemic on her current dose of lasix.  S edema, or weight gain of 2 pounds in 1 day or 5 pounds in 1 week.he can take an extra lasix as needed for shortness of breath,   # Hypertensive heart disease: BP much better controlled after adding BiDil.  Continue losartan and metoprolol.  # Hyperlipidemia:  Rosuvastatin was reduced due to muscle cramping.  Symptoms are better but still present.  Repeat lipids and CMP.  She will likely need to add ezetimibe or a PCSK9 inhibiotr.   # OSA: In process of getting oral device.     Current medicines are reviewed at length with the patient today.  The patient does not have concerns regarding medicines.  The following changes have been made: none  Labs/ tests ordered today include:   Orders Placed This Encounter  Procedures  . Lipid panel  . Comprehensive metabolic panel     Disposition:   FU with Elnor Renovato C. Oval Linsey, MD, [facility] in 3 months.      Signed, Hanaa Payes C. Oval Linsey, MD, [facility]  04/05/2018 8:47 AM    [facility]

## 2018-04-05 NOTE — Patient Instructions (Addendum)
Medication Instructions:  TAKE FUROSEMIDE AS NEEDED FOR SHORTNESS OF BREATH OR WEIGHT GAIN OF 2 POUNDS IN 24 HOURS OR 5 POUNDS IN 7 DAYS  Labwork: FASTING LP/CMET  Testing/Procedures: NONE  Follow-Up: Your physician recommends that you schedule a follow-up appointment in: 3 MONTHS   If you need a refill on your cardiac medications before your next appointment, please call your pharmacy.

## 2018-04-26 ENCOUNTER — Telehealth: Payer: Self-pay | Admitting: *Deleted

## 2018-04-26 DIAGNOSIS — Z5181 Encounter for therapeutic drug level monitoring: Secondary | ICD-10-CM

## 2018-04-26 DIAGNOSIS — E7800 Pure hypercholesterolemia, unspecified: Secondary | ICD-10-CM

## 2018-04-26 DIAGNOSIS — I10 Essential (primary) hypertension: Secondary | ICD-10-CM

## 2018-04-26 MED ORDER — EZETIMIBE 10 MG PO TABS: 10.0000 mg | ORAL_TABLET | Freq: Every day | ORAL | 5 refills | Status: DC

## 2018-04-26 NOTE — Telephone Encounter (Signed)
-----   Message from Skeet Latch, MD sent at 04/26/2018  9:03 AM EDT ----- Cholesterol levels are not bad but her LDL should be less than 70.  Recommend adding Zetia 10mg .  Repeat lipids and CMP in 6-8 weeks. Kidney function and electrolytes are normal.

## 2018-04-26 NOTE — Telephone Encounter (Signed)
Advised patient of lab results and medication changes. Lab orders placed and mailed to patient

## 2018-05-02 ENCOUNTER — Ambulatory Visit (INDEPENDENT_AMBULATORY_CARE_PROVIDER_SITE_OTHER): Payer: BC Managed Care – PPO | Admitting: Family Medicine

## 2018-05-02 ENCOUNTER — Encounter: Payer: Self-pay | Admitting: Family Medicine

## 2018-05-02 ENCOUNTER — Other Ambulatory Visit: Payer: Self-pay

## 2018-05-02 VITALS — BP 123/69 | HR 89 | Temp 99.1°F | Resp 17 | Ht 61.25 in | Wt 185.4 lb

## 2018-05-02 DIAGNOSIS — Z1231 Encounter for screening mammogram for malignant neoplasm of breast: Secondary | ICD-10-CM

## 2018-05-02 DIAGNOSIS — E119 Type 2 diabetes mellitus without complications: Secondary | ICD-10-CM | POA: Diagnosis not present

## 2018-05-02 DIAGNOSIS — Z23 Encounter for immunization: Secondary | ICD-10-CM

## 2018-05-02 DIAGNOSIS — Z0000 Encounter for general adult medical examination without abnormal findings: Secondary | ICD-10-CM

## 2018-05-02 DIAGNOSIS — Z1239 Encounter for other screening for malignant neoplasm of breast: Secondary | ICD-10-CM

## 2018-05-02 DIAGNOSIS — Z1151 Encounter for screening for human papillomavirus (HPV): Secondary | ICD-10-CM

## 2018-05-02 MED ORDER — GLUCOSE BLOOD VI STRP: ORAL_STRIP | 12 refills | Status: AC

## 2018-05-02 MED ORDER — LOSARTAN POTASSIUM 25 MG PO TABS: 25.0000 mg | ORAL_TABLET | Freq: Every day | ORAL | 1 refills | Status: DC

## 2018-05-02 NOTE — Progress Notes (Signed)
Chief Complaint  Patient presents with  . Annual Exam    with pap    Subjective:  Marie Tanner is a 62 y.o. female here for a health maintenance visit.  Patient is  Pt   Patient Active Problem List   Diagnosis Date Noted  . Hx of colonic polyp 01/03/2018  . Angina, class III ([place]) 12/07/2017  . Abnormal nuclear stress test 12/07/2017  . Type II diabetes mellitus ([place])   . Sleep apnea   . Renal disorder   . Hypertension   . History of gout   . Heart murmur   . GERD (gastroesophageal reflux disease)   . Coronary artery disease involving native coronary artery of native heart with angina pectoris ([place])   . Cancer ([place])   . Breast cancer ([place])   . Hyperlipidemia LDL goal <70 11/16/2016  . Malignant neoplasm of female breast ([place]) 01/24/2016  . Abnormal finding on mammography 01/17/2016  . Chest pain 06/06/2015  . Diarrhea 06/06/2015  . Essential hypertension 06/06/2015  . Depression   . Encounter for therapeutic drug monitoring 02/19/2015  . Mitral regurgitation 01/30/2015  . 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[place]) 01/30/2015  . Left ventricular systolic dysfunction 25/42/7062  . Dyspnea 11/24/2014  . Cardiomyopathy ([place]) 11/24/2014  . DVT (deep venous thrombosis) ([place]) 11/24/2014  . DM type 2 (diabetes mellitus, type 2) ([place]) 11/24/2014  . HCAP (healthcare-associated pneumonia) 11/22/2014  . 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 Controlled type 2 diabetes mellitus without complication ([place]) 37/62/8315  . High sodium levels 10/29/2014  . Elevated WBC count 10/29/2014  . Acute confusion due to medical condition 10/27/2014  . Insomnia due to medical condition 10/27/2014  . Acute respiratory failure ([place]) 10/24/2014  . 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[place]) 10/24/2014  . Common femoral artery injury 10/21/2014  . Injury of kidney 10/20/2014  . Cardiogenic shock ([place]) 10/20/2014  . Laceration of liver 10/20/2014  . ST  elevation myocardial infarction (STEMI) ([place]) 10/20/2014  . STEMI (ST elevation myocardial infarction) ([place]) 10/19/2014  . Pulmonary edema 10/19/2014  . Liver laceration 10/19/2014  . History of blood transfusion 09/30/2014  . Diabetes mellitus ([place]) 04/19/2013    Past Medical History:  Diagnosis Date  . Breast cancer ([place])   . Cancer ([place])   . Coronary artery disease   . Depression   . DVT (deep venous thrombosis) ([place]) 11/15   left femerol artery injury during resusciation.  [name] GERD (gastroesophageal reflux disease)   . HCAP (healthcare-associated pneumonia) 11/22/2014  . Heart murmur   . High cholesterol   . History of blood transfusion 09/2014   related to MI  . History of gout   . Hypertension   . Liver laceration 10/19/14  . Pulmonary edema 10/19/14  . Renal disorder   . Sleep apnea    "suppose to wear mask but I don't" (11/22/2014)  . STEMI (ST elevation myocardial infarction) ([place]) 10/19/14  . Type II diabetes mellitus ([place])     Past Surgical History:  Procedure Laterality Date  . ABDOMINAL HYSTERECTOMY  ~ 2000  . APPLICATION OF WOUND VAC  10/2014   "over my naval" (11/22/2014)  . BREAST LUMPECTOMY WITH AXILLARY LYMPH NODE BIOPSY    . BREAST SURGERY    . [place]; 1982  . CORONARY ANGIOPLASTY WITH STENT PLACEMENT  09/2014   "multiple"/notes 11/22/2014  . EXTRACORPOREAL CIRCULATION    . Femerol artery repair Right 09/2014   pt insists it  was a right femoral artery repair on 11/22/2014  . LAPAROSCOPIC ABDOMINAL EXPLORATION    . LEFT HEART CATH AND CORONARY ANGIOGRAPHY N/A 12/07/2017   Procedure: LEFT HEART CATH AND CORONARY ANGIOGRAPHY;  Surgeon: Leonie Man, MD;  Location: [facility];  Service: Cardiovascular;  Laterality: N/A;  . LEFT VENTRICULAR ASSIST DEVICE     pt is not aware of this hx on 11/22/2014  . Liver laceration repair  09/2014   related to CPR/notes 11/22/2014  . TRACHEOSTOMY  09/2014   "closed on it's own when they  took it out"  . TUBAL LIGATION  1982     Outpatient Medications Prior to Visit  Medication Sig Dispense Refill  . acetaminophen (TYLENOL) 500 MG tablet Take 1,000 mg by mouth 2 (two) times daily as needed (pain).     [name] aspirin EC 81 MG tablet Take 1 tablet (81 mg total) by mouth daily.    . blood glucose meter kit and supplies KIT by Does not apply route daily as needed. Dispense based on patient and insurance preference. Use up to four times daily as directed. (FOR ICD-9 250.00, 250.01).    . BRILINTA 60 MG TABS tablet TAKE 1 TABLET (60 MG TOTAL) BY MOUTH 2 (TWO) TIMES DAILY. 180 tablet 2  . diclofenac sodium (VOLTAREN) 1 % GEL Apply 2 g topically 4 (four) times daily. (Patient taking differently: Apply 2 g topically 4 (four) times daily as needed (pain). ) 1 Tube 3  . exemestane (AROMASIN) 25 MG tablet Take 25 mg by mouth daily after breakfast.    . ezetimibe (ZETIA) 10 MG tablet Take 1 tablet (10 mg total) by mouth daily. 30 tablet 5  . furosemide (LASIX) 40 MG tablet TAKE 1 TABLET AS NEEDED FOR SHORTNESS OF BREATH OR WEIGHT GAIN OF 2 POUNDS IN 24 HOURS OR 5 POUNDS IN 1 WEEK 30 tablet 1  . glucose blood test strip 1 each by Other route as needed for other. Use as instructed    . HYDROcodone-acetaminophen (NORCO/VICODIN) 5-325 MG tablet Take 1 tablet by mouth every 6 (six) hours as needed for severe pain. 10 tablet 0  . isosorbide-hydrALAZINE (BIDIL) 20-37.5 MG tablet Take 1 tablet by mouth 2 (two) times daily. 60 tablet 5  . loratadine (CLARITIN) 10 MG tablet Take 10 mg by mouth daily as needed for allergies.     . magnesium oxide (MAG-OX) 400 MG tablet Take 1 tablet (400 mg total) by mouth daily. 30 tablet 1  . metFORMIN (GLUCOPHAGE-XR) 500 MG 24 hr tablet Take 500 mg by mouth daily with breakfast.     . metoprolol succinate (TOPROL-XL) 25 MG 24 hr tablet TAKE 1 TABLET (25 MG TOTAL) BY MOUTH DAILY. 30 tablet 11  . nitroGLYCERIN (NITROSTAT) 0.4 MG SL tablet Place 1 tablet (0.4 mg total)  under the tongue every 5 (five) minutes as needed for chest pain. max of 3 if no relief call 911 25 tablet PRN  . pantoprazole (PROTONIX) 40 MG tablet Take 1 tablet (40 mg total) by mouth daily. 30 tablet 11  . potassium chloride SA (K-DUR,KLOR-CON) 20 MEQ tablet Take 20 mEq by mouth daily.     . rosuvastatin (CRESTOR) 20 MG tablet TAKE 1 TABLET BY MOUTH 3 TIMES PER WEEK 30 tablet 2  . SUMAtriptan (IMITREX) 25 MG tablet Take one tablet at the onset of headache. May repeat in 2 hours if headache persists or recurs. 30 tablet 3  . losartan (COZAAR) 25 MG tablet Take  25 mg by mouth daily.     No facility-administered medications prior to visit.     Allergies  Allergen Reactions  . Dilaudid [Hydromorphone Hcl] Anaphylaxis, Itching and Swelling  . Hydromorphone Anaphylaxis, Itching and Swelling  . Epinephrine Anxiety and Other (See Comments)    Severe anxiety  . Lactose Intolerance (Gi) Diarrhea     Family History  Problem Relation Age of Onset  . Diabetes Mother   . Hypertension Mother   . COPD Mother   . Heart attack Father   . Alzheimer's disease Father   . Diabetes Brother   . Stroke Neg Hx      Health Habits: Dental Exam: up to date Eye Exam: up to date Exercise: 2 times/week on average Current exercise activities: walking/running Diet: balanced  Social History   Socioeconomic History  . Marital status: Divorced    Spouse name: Not on file  . Number of children: Not on file  . Years of education: Not on file  . Highest education level: Not on file  Occupational History  . Not on file  Social Needs  . Financial resource strain: Not on file  . Food insecurity:    Worry: Not on file    Inability: Not on file  . Transportation needs:    Medical: Not on file    Non-medical: Not on file  Tobacco Use  . Smoking status: Never Smoker  . Smokeless tobacco: Never Used  Substance and Sexual Activity  . Alcohol use: No  . Drug use: No  . Sexual activity: Not on file    Lifestyle  . Physical activity:    Days per week: Not on file    Minutes per session: Not on file  . Stress: Not on file  Relationships  . Social connections:    Talks on phone: Not on file    Gets together: Not on file    Attends religious service: Not on file    Active member of club or organization: Not on file    Attends meetings of clubs or organizations: Not on file    Relationship status: Not on file  . Intimate partner violence:    Fear of current or ex partner: Not on file    Emotionally abused: Not on file    Physically abused: Not on file    Forced sexual activity: Not on file  Other Topics Concern  . Not on file  Social History Narrative  . Not on file   Social History   Substance and Sexual Activity  Alcohol Use No   Social History   Tobacco Use  Smoking Status Never Smoker  Smokeless Tobacco Never Used   Social History   Substance and Sexual Activity  Drug Use No    GYN: Sexual Health Menstrual status: regular menses LMP: No LMP recorded. Patient has had a hysterectomy. Last pap smear: see HM section History of abnormal pap smears:   Health Maintenance: See under health Maintenance activity for review of completion dates as well. Immunization History  Administered Date(s) Administered  . Influenza,inj,Quad PF,6+ Mos 10/28/2017  . Influenza-Unspecified 08/30/2014  . Pneumococcal Polysaccharide-23 01/28/2017  . Tdap 05/02/2018      Depression Screen-PHQ2/9 Depression screen [facility] 2/9 05/02/2018 01/03/2018 12/02/2017 10/28/2017 06/26/2015  Decreased Interest 0 0 0 0 0  Down, Depressed, Hopeless 0 0 0 0 0  PHQ - 2 Score 0 0 0 0 0       Depression Severity and Treatment Recommendations:  0-4=  None  5-9= Mild / Treatment: Support, educate to call if worse; return in one month  10-14= Moderate / Treatment: Support, watchful waiting; Antidepressant or Psycotherapy  15-19= Moderately severe / Treatment: Antidepressant OR Psychotherapy  >= 20 =  Major depression, severe / Antidepressant AND Psychotherapy    Review of Systems   Review of Systems  Constitutional: Negative for chills, fever and weight loss.  HENT: Negative for congestion, nosebleeds and sinus pain.   Eyes: Negative for blurred vision and double vision.  Respiratory: Negative for cough and sputum production.   Cardiovascular: Negative for chest pain, palpitations and leg swelling.  Gastrointestinal: Negative for abdominal pain, nausea and vomiting.  Genitourinary: Negative for dysuria, frequency and urgency.  Musculoskeletal: Negative for back pain, myalgias and neck pain.  Skin: Negative for itching and rash.  Neurological: Negative for dizziness, tingling, tremors and headaches.  Psychiatric/Behavioral: Negative for depression and hallucinations. The patient is not nervous/anxious.       Objective:   Vitals:   05/02/18 1011  BP: 123/69  Pulse: 89  Resp: 17  Temp: 99.1 F (37.3 C)  TempSrc: Oral  SpO2: 99%  Weight: 185 lb 6.4 oz (84.1 kg)  Height: 5' 1.25" (1.556 m)    Body mass index is 34.75 kg/m.  Physical Exam BP 123/69 (BP Location: Left Arm, Patient Position: Sitting, Cuff Size: Large)   Pulse 89   Temp 99.1 F (37.3 C) (Oral)   Resp 17   Ht 5' 1.25" (1.556 m)   Wt 185 lb 6.4 oz (84.1 kg)   SpO2 99%   BMI 34.75 kg/m   General Appearance:    Alert, cooperative, no distress, appears stated age  Head:    Normocephalic, without obvious abnormality, atraumatic  Eyes:    PERRL, conjunctiva/corneas clear, EOM's intact, fundi    benign, both eyes  Ears:    Normal TM's and external ear canals, both ears  Nose:   Nares normal, septum midline, mucosa normal, no drainage    or sinus tenderness  Throat:   Lips, mucosa, and tongue normal; teeth and gums normal  Neck:   Supple, symmetrical, trachea midline, no adenopathy;    thyroid:  no enlargement/tenderness/nodules; no carotid   bruit or JVD  Back:     Symmetric, no curvature, ROM normal,  no CVA tenderness  Lungs:     Clear to auscultation bilaterally, respirations unlabored  Chest Wall:    No tenderness or deformity   Heart:    Regular rate and rhythm, S1 and S2 normal, no murmur, rub   or gallop  Abdomen:     Soft, non-tender, bowel sounds active all four quadrants,    no masses, no organomegaly  Genitalia:    Normal female without lesion, discharge or tenderness  Extremities:   Extremities normal, atraumatic, no cyanosis or edema  Pulses:   2+ and symmetric all extremities  Skin:   Skin color, texture, turgor normal, no rashes or lesions  Lymph nodes:   Cervical, supraclavicular, and axillary nodes normal  Neurologic:   CNII-XII intact, normal strength, sensation and reflexes    throughout    Mammogram 02/28/2018  Final Diagnosis A: Breast, right lumpectomy bed, core needle biopsy - Small focus of atypical cells with squamous metaplasia, favor reactive  changes (see comment) - Periductal chronic inflammation, hemosiderin laden macrophages, fat  necrosis, stromal fibrosis, and associated fine and coarse  microcalcifications, consistent with surgical site changes and  chemotherapy effect - Epithelial changes consistent with radiation effect -  No in situ or invasive carcinoma identified Core needle biopsy results are benign and concordant with the imaging. Recommended follow up is diagnostic mammogram in 12 months.  Assessment/Plan:   Patient was seen for a health maintenance exam.  Counseled the patient on health maintenance issues. Reviewed her health mainteance schedule and ordered appropriate tests (see orders.) Counseled on regular exercise and weight management. Recommend regular eye exams and dental cleaning.   The following issues were addressed today for health maintenance:   Marie Tanner was seen today for annual exam.  Diagnoses and all orders for this visit:  Encounter for health maintenance examination in adult Women's Health Maintenance Plan Advised  monthly breast exam and annual mammogram Advised dental exam every six months Discussed stress management Discussed pap smear screening guidelines   Type 2 diabetes mellitus without complication, without long-term current use of insulin ([place])- diabetes well controlled -     HM Diabetes Foot Exam -     Hemoglobin A1c; Future  Need for Tdap vaccination -     Tdap vaccine greater than or equal to 7yo IM  Encounter for screening breast examination -     Cancel: MM Digital Screening; Future  Screening for HPV (human papillomavirus) -     Pap IG and HPV (high risk) DNA detection  Other orders -     losartan (COZAAR) 25 MG tablet; Take 1 tablet (25 mg total) by mouth daily. -     glucose blood test strip; Use as instructed. E11.5. To check blood glucose daily.    No follow-ups on file.    Body mass index is 34.75 kg/m.:  Discussed the patient's BMI with patient. The BMI body mass index is 34.75 kg/m.     Future Appointments  Date Time Provider [place]  07/07/2018  8:00 AM Skeet Latch, MD [facility]    Patient Instructions       IF you received an x-ray today, you will receive an invoice from [facility]. Please contact [facility] Radiology at [phone] with questions or concerns regarding your invoice.   IF you received labwork today, you will receive an invoice from [place]. Please contact [facility] at [phone] with questions or concerns regarding your invoice.   Our billing staff will not be able to assist you with questions regarding bills from these companies.  You will be contacted with the lab results as soon as they are available. The fastest way to get your results is to activate your My Chart account. Instructions are located on the last page of this paperwork. If you have not heard from [place] regarding the results in 2 weeks, please contact this office.        IF you received an x-ray today, you will receive an invoice  from [facility] Rad[facility]. Please contact [facility] Radiology at [phone] with questions or concerns regarding your invoice.   IF you received labwork today, you will receive an invoice from [place]. Please contact [facility] at [phone] with questions or concerns regarding your invoice.   Our billing staff will not be able to assist you with questions regarding bills from these companies.  You will be contacted with the lab results as soon as they are available. The fastest way to get your results is to activate your My Chart account. Instructions are located on the last page of this paperwork. If you have not heard from [place] regarding the results in 2 weeks, please contact this office.    We recommend that you schedule a  mammogram for breast cancer screening. Typically, you do not need a referral to do this. Please contact a local imaging center to schedule your mammogram.  [facility] - [phone]  *ask for the Radiology Department [facility] ([place]) - [phone] or [phone]  [facility] - [phone] [place] [phone] [facility] - [phone]  *ask for the [facility] - [phone]  *ask for the Radiology Department [facility] - [phone]  *ask for the [place] - [phone] Kegel Exercises Kegel exercises help strengthen the muscles that support the rectum, vagina, small intestine, bladder, and uterus. Doing Kegel exercises can help:  Improve bladder and bowel control.  Improve sexual response.  Reduce problems and discomfort during pregnancy.  Kegel exercises involve squeezing your pelvic floor muscles, which are the same muscles you squeeze when you try to stop the flow of urine. The exercises can be done while sitting, standing, or lying down, but it is best to vary your position. Phase 1  exercises 1. Squeeze your pelvic floor muscles tight. You should feel a tight lift in your rectal area. If you are a female, you should also feel a tightness in your vaginal area. Keep your stomach, buttocks, and legs relaxed. 2. Hold the muscles tight for up to 10 seconds. 3. Relax your muscles. Repeat this exercise 50 times a day or as many times as told by your health care provider. Continue to do this exercise for at least 4-6 weeks or for as long as told by your health care provider. This information is not intended to replace advice given to you by your health care provider. Make sure you discuss any questions you have with your health care provider. Document Released: 11/02/2012 Document Revised: 07/11/2016 Document Reviewed: 10/06/2015 Elsevier Interactive Patient Education  [employer].

## 2018-05-02 NOTE — Patient Instructions (Addendum)
IF you received an x-ray today, you will receive an invoice from [facility] Radiology. Please contact [facility] Radiology at [phone] with questions or concerns regarding your invoice.   IF you received labwork today, you will receive an invoice from [place]. Please contact [facility] at [phone] with questions or concerns regarding your invoice.   Our billing staff will not be able to assist you with questions regarding bills from these companies.  You will be contacted with the lab results as soon as they are available. The fastest way to get your results is to activate your My Chart account. Instructions are located on the last page of this paperwork. If you have not heard from [place] regarding the results in 2 weeks, please contact this office.        IF you received an x-ray today, you will receive an invoice from [facility] Radiology. Please contact [facility] Radiology at [phone] with questions or concerns regarding your invoice.   IF you received labwork today, you will receive an invoice from [place]. Please contact [facility] at [phone] with questions or concerns regarding your invoice.   Our billing staff will not be able to assist you with questions regarding bills from these companies.  You will be contacted with the lab results as soon as they are available. The fastest way to get your results is to activate your My Chart account. Instructions are located on the last page of this paperwork. If you have not heard from [place] regarding the results in 2 weeks, please contact this office.    We recommend that you schedule a mammogram for breast cancer screening. Typically, you do not need a referral to do this. Please contact a local imaging center to schedule your mammogram.  [facility] - [phone]  *ask for the Radiology Department [facility] [place] ([place]) - [phone] or [phone]  [facility] - [phone] [place] [phone] [facility] - [phone]  *ask for the [facility] - [phone]  *ask for the Radiology Department [facility] - [phone]  *ask for the [place] - [phone] Kegel Exercises Kegel exercises help strengthen the muscles that support the rectum, vagina, small intestine, bladder, and uterus. Doing Kegel exercises can help:  Improve bladder and bowel control.  Improve sexual response.  Reduce problems and discomfort during pregnancy.  Kegel exercises involve squeezing your pelvic floor muscles, which are the same muscles you squeeze when you try to stop the flow of urine. The exercises can be done while sitting, standing, or lying down, but it is best to vary your position. Phase 1 exercises 1. Squeeze your pelvic floor muscles tight. You should feel a tight lift in your rectal area. If you are a female, you should also feel a tightness in your vaginal area. Keep your stomach, buttocks, and legs relaxed. 2. Hold the muscles tight for up to 10 seconds. 3. Relax your muscles. Repeat this exercise 50 times a day or as many times as told by your health care provider. Continue to do this exercise for at least 4-6 weeks or for as long as told by your health care provider. This information is not intended to replace advice given to you by your health care provider. Make sure you discuss any questions you have with your health care provider. Document Released: 11/02/2012 Document Revised: 07/11/2016 Document Reviewed: 10/06/2015 Elsevier Interactive Patient Education  2018 [employer] Inc.  

## 2018-05-04 LAB — PAP IG AND HPV HIGH-RISK
HPV, high-risk: NEGATIVE
PAP SMEAR COMMENT: 0

## 2018-05-06 ENCOUNTER — Encounter: Payer: Self-pay | Admitting: Family Medicine

## 2018-06-14 ENCOUNTER — Other Ambulatory Visit: Payer: Self-pay | Admitting: Family Medicine

## 2018-06-15 NOTE — Telephone Encounter (Signed)
Refill request for magnesium oxide 400 mg #30 with 1 refill approved. D[place], CMA

## 2018-07-07 ENCOUNTER — Ambulatory Visit: Payer: BC Managed Care – PPO | Admitting: Cardiovascular Disease

## 2018-07-22 ENCOUNTER — Ambulatory Visit: Payer: BC Managed Care – PPO | Admitting: Cardiovascular Disease

## 2018-07-22 ENCOUNTER — Encounter: Payer: Self-pay | Admitting: Cardiovascular Disease

## 2018-07-22 VITALS — BP 126/70 | HR 90 | Ht 62.0 in | Wt 190.0 lb

## 2018-07-22 DIAGNOSIS — E7800 Pure hypercholesterolemia, unspecified: Secondary | ICD-10-CM

## 2018-07-22 DIAGNOSIS — I5032 Chronic diastolic (congestive) heart failure: Secondary | ICD-10-CM

## 2018-07-22 DIAGNOSIS — I110 Hypertensive heart disease with heart failure: Secondary | ICD-10-CM

## 2018-07-22 DIAGNOSIS — I5043 Acute on chronic combined systolic (congestive) and diastolic (congestive) heart failure: Secondary | ICD-10-CM | POA: Diagnosis not present

## 2018-07-22 DIAGNOSIS — G4733 Obstructive sleep apnea (adult) (pediatric): Secondary | ICD-10-CM | POA: Diagnosis not present

## 2018-07-22 NOTE — Progress Notes (Signed)
Cardiology Office Note   Date:  07/22/2018   ID:  Marie Tanner, DOB [birth year], MRN [id number]  PCP:  Forrest Moron, MD  Cardiologist:   Skeet Latch, MD  Oncologist: Samuel Bouche, MD Radiation Oncologist: Ladona Horns, MD Surgical Oncologist: Nancie Neas, MD  No chief complaint on file.   History of Present Illness: Marie Tanner is a 62 y.o. female with CAD s/p STEMI, hypertension, chronic systolic and diastolic heart failure, OSA not on CPAP, diabetes, prior DVT, breast cancer, and mitral regurgitation who pres Ients for follow up.  In 2015 she had a STEMI and cardiac arrest. She was cared for at [facility]. She ultimately received 2 DES (LM/LAD and LCx).  She had a prolonged hospitalization requiring tracheostomy.  Her liver was lacerated during CPR.  She required surgical repair after femoral catheterization.  She also developed a DVT in the hospital.  One week after discharge she developed pneumonia and was admitted at [facility].  She has been seen in the hospital twice for chest pain and ruled out for MI.  She had a negative Myoview 05/2015.  She also had negative CT scans for PE.  Marie Tanner also underwent surgery for breast cancer on 02/21/16.  She completed her radiation in September 2017.  She was referred for sleep study which was abnormal.  However, she declined using the CPAP device and is in the process of being fitted for an oral airway.  Marie Tanner had an echo 06/2016 that showed LVEF 50-55% with grade 1 diastolic dysfunction.    Marie Tanner reported an episode of chest pain that occurred while driving.  She was referred for [facility] 11/24/17 that revealed LVEF 36% with anterior, anteroseptal, apical inferior and apical lateral defects.  There was concern for infarct with peri-infarct ischemia in the inferior and apical walls.  She underwent LHC 11/2017 that revealed patent stents that were heavily calcified and there was a step down after the takeoff  of the diagonal.  She had 40% RI, 40% proximal LCx and 20% prox LAD.  LVEF was 35-45% and LVEDP was elevated.  BiDil was added to her regimen for afterload reduction and poorly controlled hypertension.  Since making that addition her blood pressure has been much better controlled.  She has been doing well until 2 days ago.  She had to take Lasix for the first time.  She has had increased shortness of breath, orthopnea, and PND.  She was unable to sleep last night because of the orthopnea.  She has not had any changes in her diet.  She has good urinary output to Lasix when she does take it.  She has not noted any lower extremity edema.  At her last appointment she started on Zetia.  She continues to have muscle aches.  It is much better than when she was on atorvastatin but still present.   Past Medical History:  Diagnosis Date  . Breast cancer ([place])   . Cancer ([place])   . Coronary artery disease   . Depression   . DVT (deep venous thrombosis) ([place]) 11/15   left femerol artery injury during resusciation.  [name] GERD (gastroesophageal reflux disease)   . HCAP (healthcare-associated pneumonia) 11/22/2014  . Heart murmur   . High cholesterol   . History of blood transfusion 09/2014   related to MI  . History of gout   . Hypertension   . Liver laceration 10/19/14  . Pulmonary edema 10/19/14  . Renal disorder   .  Sleep apnea    "suppose to wear mask but I don't" (11/22/2014)  . STEMI (ST elevation myocardial infarction) ([place]) 10/19/14  . Type II diabetes mellitus ([place])     Past Surgical History:  Procedure Laterality Date  . ABDOMINAL HYSTERECTOMY  ~ 2000  . APPLICATION OF WOUND VAC  10/2014   "over my naval" (11/22/2014)  . BREAST LUMPECTOMY WITH AXILLARY LYMPH NODE BIOPSY    . BREAST SURGERY    . [place]; 1982  . CORONARY ANGIOPLASTY WITH STENT PLACEMENT  09/2014   "multiple"/notes 11/22/2014  . EXTRACORPOREAL CIRCULATION    . Femerol artery repair Right 09/2014   pt  insists it was a right femoral artery repair on 11/22/2014  . LAPAROSCOPIC ABDOMINAL EXPLORATION    . LEFT HEART CATH AND CORONARY ANGIOGRAPHY N/A 12/07/2017   Procedure: LEFT HEART CATH AND CORONARY ANGIOGRAPHY;  Surgeon: Leonie Man, MD;  Location: [place] [facility];  Service: Cardiovascular;  Laterality: N/A;  . LEFT VENTRICULAR ASSIST DEVICE     pt is not aware of this hx on 11/22/2014  . Liver laceration repair  09/2014   related to CPR/notes 11/22/2014  . TRACHEOSTOMY  09/2014   "closed on it's own when they took it out"  . TUBAL LIGATION  1982     Current Outpatient Medications  Medication Sig Dispense Refill  . acetaminophen (TYLENOL) 500 MG tablet Take 1,000 mg by mouth 2 (two) times daily as needed (pain).     [name] aspirin EC 81 MG tablet Take 1 tablet (81 mg total) by mouth daily.    . blood glucose meter kit and supplies KIT by Does not apply route daily as needed. Dispense based on patient and insurance preference. Use up to four times daily as directed. (FOR ICD-9 250.00, 250.01).    . BRILINTA 60 MG TABS tablet TAKE 1 TABLET (60 MG TOTAL) BY MOUTH 2 (TWO) TIMES DAILY. 180 tablet 2  . diclofenac sodium (VOLTAREN) 1 % GEL Apply 2 g topically 4 (four) times daily. (Patient taking differently: Apply 2 g topically 4 (four) times daily as needed (pain). ) 1 Tube 3  . exemestane (AROMASIN) 25 MG tablet Take 25 mg by mouth daily after breakfast.    . ezetimibe (ZETIA) 10 MG tablet Take 1 tablet (10 mg total) by mouth daily. 30 tablet 5  . furosemide (LASIX) 40 MG tablet TAKE 1 TABLET AS NEEDED FOR SHORTNESS OF BREATH OR WEIGHT GAIN OF 2 POUNDS IN 24 HOURS OR 5 POUNDS IN 1 WEEK 30 tablet 1  . glucose blood test strip 1 each by Other route as needed for other. Use as instructed    . glucose blood test strip Use as instructed. E11.5. To check blood glucose daily. 100 each 12  . HYDROcodone-acetaminophen (NORCO/VICODIN) 5-325 MG tablet Take 1 tablet by mouth every 6 (six) hours as  needed for severe pain. 10 tablet 0  . isosorbide-hydrALAZINE (BIDIL) 20-37.5 MG tablet Take 1 tablet by mouth 2 (two) times daily. 60 tablet 5  . loratadine (CLARITIN) 10 MG tablet Take 10 mg by mouth daily as needed for allergies.     [name] losartan (COZAAR) 25 MG tablet Take 1 tablet (25 mg total) by mouth daily. 90 tablet 1  . magnesium oxide (MAG-OX) 400 MG tablet TAKE 1 TABLET BY MOUTH EVERY DAY 30 tablet 1  . metFORMIN (GLUCOPHAGE-XR) 500 MG 24 hr tablet Take 500 mg by mouth daily with breakfast.     .  metoprolol succinate (TOPROL-XL) 25 MG 24 hr tablet TAKE 1 TABLET (25 MG TOTAL) BY MOUTH DAILY. 30 tablet 11  . nitroGLYCERIN (NITROSTAT) 0.4 MG SL tablet Place 1 tablet (0.4 mg total) under the tongue every 5 (five) minutes as needed for chest pain. max of 3 if no relief call 911 25 tablet PRN  . pantoprazole (PROTONIX) 40 MG tablet Take 1 tablet (40 mg total) by mouth daily. 30 tablet 11  . potassium chloride SA (K-DUR,KLOR-CON) 20 MEQ tablet Take 20 mEq by mouth daily.     . rosuvastatin (CRESTOR) 20 MG tablet TAKE 1 TABLET BY MOUTH 3 TIMES PER WEEK 30 tablet 2  . SUMAtriptan (IMITREX) 25 MG tablet Take one tablet at the onset of headache. May repeat in 2 hours if headache persists or recurs. 30 tablet 3   No current facility-administered medications for this visit.     Allergies:   Dilaudid [hydromorphone hcl]; Hydromorphone; Epinephrine; and Lactose intolerance (gi)    Social History:  The patient  reports that she has never smoked. She has never used smokeless tobacco. She reports that she does not drink alcohol or use drugs.   Family History:  The patient's family history includes Alzheimer's disease in her father; COPD in her mother; Diabetes in her brother and mother; Heart attack in her father; Hypertension in her mother.    ROS:  Please see the history of present illness.   Otherwise, review of systems are positive for none.   All other systems are reviewed and negative.     PHYSICAL EXAM: VS:  BP 126/70   Pulse 90   Ht _0  (1.575 m)   Wt 190 lb (86.2 kg)   BMI 34.75 kg/m  , BMI Body mass index is 34.75 kg/m. GENERAL:  Well appearing HEENT: Pupils equal round and reactive, fundi not visualized, oral mucosa unremarkable NECK:  JVP 2 cm above clavicle at 45 degrees.  Waveform within normal limits, carotid upstroke brisk and symmetric, no bruits, no thyromegaly LYMPHATICS:  No cervical adenopathy LUNGS:  Clear to auscultation bilaterally HEART:  RRR.  PMI not displaced or sustained,S1 and S2 within normal limits, no S3, no S4, no clicks, no rubs, II/VI systolic murmurs ABD:  Flat, positive bowel sounds normal in frequency in pitch, no bruits, no rebound, no guarding, no midline pulsatile mass, no hepatomegaly, no splenomegaly EXT:  2 plus pulses throughout, no edema, no cyanosis no clubbing SKIN:  No rashes no nodules NEURO:  Cranial nerves II through XII grossly intact, motor grossly intact throughout PSYCH:  Cognitively intact, oriented to person place and time   EKG:  EKG is ordered today. 02/11/16: Sinus arrhythmia.  Rate 71 bpm. Non-specific ST-T changes. 10/05/16: Sinus rhythm. Rate 85 bpm. Prior inferior infarct. 11/16/17: Sinus rhythm.  Rate 87 bpm.  Prior inferior infarct.  07/22/18: Sinu srhythm.  Rate 90 bpm.  Prior inferior infarct.     Echo 07/01/16: Study Conclusions  - Left ventricle: The cavity size was normal. Systolic function was   normal. The estimated ejection fraction was in the range of 50%   to 55%. Wall motion was normal; there were no regional wall   motion abnormalities. Doppler parameters are consistent with   abnormal left ventricular relaxation (grade 1 diastolic   dysfunction). Doppler parameters are consistent with   indeterminate ventricular filling pressure. - Aortic valve: Transvalvular velocity was within the normal range.   There was no stenosis. There was no regurgitation. - Mitral valve: There was  mild  regurgitation. - Left atrium: The atrium was mildly dilated. - Right ventricle: The cavity size was normal. Wall thickness was   normal. Systolic function was normal. - Atrial septum: No defect or patent foramen ovale was identified   by color flow Doppler. - Tricuspid valve: There was mild regurgitation. - Pulmonary arteries: Systolic pressure was within the normal   range. PA peak pressure: 23 mm Hg (S).  Lexiscan Myoview 11/24/17:  The left ventricular ejection fraction is moderately decreased (30-44%).  Nuclear stress EF: 36%.  There was no ST segment deviation noted during stress.  There is a large defect of severe severity present in the mid anterior, mid inferior, apical anterior, apical septal, apical inferior, apical lateral and apex location. The defect is partially reversible. This is consistent with infarct with small amount of peri infarct ischemia in the inferior and apical walls.  This is a high risk study.  Compared to prior study the EF has declined and there appears to be peri infarct ischemia in the inferior wall although this could also be variations in diaphragmatic attenuation artifact.   LHC 12/07/17:  Mid LM to Ost LAD DES Stent is 10% stenosed. -This entire segment is heavily calcified. There is a significant step down after the stent at the takeoff of the diagonal branches.  Prox LAD lesion is 20% stenosed.  Ost Ramus lesion is 40% stenosed. Proc Circumflex ~40%.  There is moderate left ventricular systolic dysfunction. The left ventricular ejection fraction is 35-45% by visual estimate.  LV end diastolic pressure is moderately elevated. 22-24 mmHg  Recent Labs: 10/28/2017: TSH 2.560 12/03/2017: Hemoglobin 12.1; Platelets 258 04/05/2018: ALT 11; BUN 13; Creatinine, Ser 0.68; Potassium 4.7; Sodium 140    Lipid Panel    Component Value Date/Time   CHOL 161 04/05/2018 0858   TRIG 63 04/05/2018 0858   HDL 50 04/05/2018 0858   CHOLHDL 3.2 04/05/2018 0858    CHOLHDL 3.1 10/05/2016 0937   VLDL 15 10/05/2016 0937   LDLCALC 98 04/05/2018 0858      Wt Readings from Last 3 Encounters:  07/22/18 190 lb (86.2 kg)  05/02/18 185 lb 6.4 oz (84.1 kg)  04/05/18 189 lb 3.2 oz (85.8 kg)      ASSESSMENT AND PLAN:  # CAD s/p STEMI: # Chest pain: Marie Tanner had an ST elevation myocardial infarction with stents placed in the LAD/left main and left circumflex arteries.  She is on  low dose ticagrelor indefinitely.  LHC 11/2017 showed patent stents with 20-40% lesions.  Chest pain resolved.  Continue aspirin, ticagrelor, Bidil, rosuvastatin, and metoprolol.    # Acute on chronic systolic and diastolic heart failure: LVEF 35-40%.  She has heart failure symptoms and her JVD is mildly elevated.  Otherwise she does not look terribly volume overloaded.  It is been 2 years and she had an echocardiogram so we will repeat it.  I will also have her take her Lasix for the next 2 days.  Consider switching to [facility] and/or adding spironolactone.  # Hypertensive heart disease: BP well-controlled on Bidil, losartan, and metoprolol.  # Hyperlipidemia:  Rosuvastatin was reduced due to muscle cramping.  Zetia was started and she continues to have symptoms.  We will refer her for PCSK9 inhibitor.   # OSA: Oral airway device.     Current medicines are reviewed at length with the patient today.  The patient does not have concerns regarding medicines.  The following changes have been made: none  Labs/ tests ordered today include:   Orders Placed This Encounter  Procedures  . EKG 12-Lead  . ECHOCARDIOGRAM COMPLETE     Disposition:   FU with Marie Ollis C. Oval Linsey, MD, [facility] in 2 weeks.      Signed, Marie Gatchel C. Oval Linsey, MD, [facility]  07/22/2018 4:42 PM    [place]

## 2018-07-22 NOTE — Patient Instructions (Signed)
Medication Instructions:  TAKE YOUR FUROSEMIDE (LASIX) TODAY AND TOMORROW   Labwork: NONE  Testing/Procedures: Your physician has requested that you have an echocardiogram. Echocardiography is a painless test that uses sound waves to create images of your heart. It provides your doctor with information about the size and shape of your heart and how well your heart's chambers and valves are working. This procedure takes approximately one hour. There are no restrictions for this procedure. [address]    Follow-Up: Your physician recommends that you schedule a follow-up appointment in: 2 WEEKS   If you need a refill on your cardiac medications before your next appointment, please call your pharmacy.

## 2018-07-26 ENCOUNTER — Other Ambulatory Visit: Payer: Self-pay

## 2018-07-26 ENCOUNTER — Ambulatory Visit (HOSPITAL_COMMUNITY): Payer: BC Managed Care – PPO | Attending: Cardiovascular Disease

## 2018-07-26 DIAGNOSIS — I2510 Atherosclerotic heart disease of native coronary artery without angina pectoris: Secondary | ICD-10-CM | POA: Insufficient documentation

## 2018-07-26 DIAGNOSIS — Z8674 Personal history of sudden cardiac arrest: Secondary | ICD-10-CM | POA: Insufficient documentation

## 2018-07-26 DIAGNOSIS — I340 Nonrheumatic mitral (valve) insufficiency: Secondary | ICD-10-CM | POA: Diagnosis not present

## 2018-07-26 DIAGNOSIS — I110 Hypertensive heart disease with heart failure: Secondary | ICD-10-CM | POA: Diagnosis not present

## 2018-07-26 DIAGNOSIS — Z853 Personal history of malignant neoplasm of breast: Secondary | ICD-10-CM | POA: Diagnosis not present

## 2018-07-26 DIAGNOSIS — I5043 Acute on chronic combined systolic (congestive) and diastolic (congestive) heart failure: Secondary | ICD-10-CM | POA: Diagnosis not present

## 2018-07-26 DIAGNOSIS — G4733 Obstructive sleep apnea (adult) (pediatric): Secondary | ICD-10-CM | POA: Insufficient documentation

## 2018-07-26 MED ORDER — PERFLUTREN LIPID MICROSPHERE
1.0000 mL | INTRAVENOUS | Status: AC | PRN
  Administered 2018-07-26: 1 mL via INTRAVENOUS

## 2018-08-02 ENCOUNTER — Ambulatory Visit (INDEPENDENT_AMBULATORY_CARE_PROVIDER_SITE_OTHER): Payer: BC Managed Care – PPO | Admitting: Pharmacist

## 2018-08-02 DIAGNOSIS — E785 Hyperlipidemia, unspecified: Secondary | ICD-10-CM | POA: Diagnosis not present

## 2018-08-02 MED ORDER — ROSUVASTATIN CALCIUM 20 MG PO TABS: ORAL_TABLET | ORAL | 2 refills | Status: DC

## 2018-08-02 NOTE — Assessment & Plan Note (Signed)
  LDL remains above goal for secondary prevention. Patient is currently taking highest tolerable statin dose plus ezetimibe.  Noted multiple trial with other stations in the past resulted in severe muscle pain.  We discussed PCSK9i MOA, indication, storage administration, common side effects, and prior authorization process. Positive lifestyle modification were also discussed during this visit.  Will initiate paperwork for Repatha/Praleunt prior authorization, and follow up as needed. Plan to repeat fasting blood work around the 5th injection of new medication. Due to leg pain, I will reduce rosuvastatin dose from 20mg  to 10mg  3x/week.

## 2018-08-02 NOTE — Patient Instructions (Signed)
Lipid Clinic (pharmacist) Trinka Keshishyan/Kristin [phone]  *CONTINUE ezetimibe (zetia) 10mg  daily* *WORK on lifestyle modifications* *DECREASE rosuvastatin (crestor) to 10mg  3 times weekly* *START paperwork for Repatha/Praluent*   Cholesterol Cholesterol is a fat. Your body needs a small amount of cholesterol. Cholesterol (plaque) may build up in your blood vessels (arteries). That makes you more likely to have a heart attack or stroke. You cannot feel your cholesterol level. Having a blood test is the only way to find out if your level is high. Keep your test results. Work with your doctor to keep your cholesterol at a good level. What do the results mean?  Total cholesterol is how much cholesterol is in your blood.  LDL is bad cholesterol. This is the type that can build up. Try to have low LDL.  HDL is good cholesterol. It cleans your blood vessels and carries LDL away. Try to have high HDL.  Triglycerides are fat that the body can store or burn for energy. What are good levels of cholesterol?  Total cholesterol below 200.  LDL below 100 is good for people who have health risks. LDL below 70 is good for people who have very high risks.  HDL above 40 is good. It is best to have HDL of 60 or higher.  Triglycerides below 150. How can I lower my cholesterol? Diet Follow your diet program as told by your doctor.  Choose fish, white meat chicken, or [place] that is roasted or baked. Try not to eat red meat, fried foods, sausage, or lunch meats.  Eat lots of fresh fruits and vegetables.  Choose whole grains, beans, pasta, potatoes, and cereals.  Choose olive oil, corn oil, or canola oil. Only use small amounts.  Try not to eat butter, mayonnaise, shortening, or palm kernel oils.  Try not to eat foods with trans fats.  Choose low-fat or nonfat dairy foods. ? Drink skim or nonfat milk. ? Eat low-fat or nonfat yogurt and cheeses. ? Try not to drink whole milk or cream. ? Try  not to eat ice cream, egg yolks, or full-fat cheeses.  Healthy desserts include angel food cake, ginger snaps, animal crackers, hard candy, popsicles, and low-fat or nonfat frozen yogurt. Try not to eat pastries, cakes, pies, and cookies.  Exercise Follow your exercise program as told by your doctor.  Be more active. Try gardening, walking, and taking the stairs.  Ask your doctor about ways that you can be more active.  Medicine  Take over-the-counter and prescription medicines only as told by your doctor. This information is not intended to replace advice given to you by your health care provider. Make sure you discuss any questions you have with your health care provider. Document Released: 02/12/2009 Document Revised: 06/17/2016 Document Reviewed: 05/28/2016 Elsevier Interactive Patient Education  [employer].

## 2018-08-02 NOTE — Progress Notes (Signed)
Patient ID: Marie Tanner                 DOB: [birth year]                    MRN: [id number]     HPI: Marie Tanner is a 62 y.o. female patient referred to lipid clinic by Dr Oval Linsey. PMH is significant for CAD s/p stent placement, STEMI, hypertension, hyperlipidemia, heart failure, diabetes, OSA not on CPAP, and mitral regurgitation. Patient present to li[pid clinic for potential PCSK9i initiation.   Current Medications:  Ezetimibe 46m daily Rosuvastatin 252m3x/week - still experiencing some leg pain but able to tolerate for now  Intolerances: Atorvastatin 8014maily - severe pain in legs and hands (worst with lipitor vs rosuvsatatin) Atorvastatin 70m66mily Rosuvastatin 70mg32mly Rosuvastatin 20mg 71my  LDL goal: < 70mg/d82miet: mainly home cooking casseroles,   Exercise: activities of daily living. 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   Family History: MI in father  Social History: denies alcohol or tobacco products  Labs: 04/05/2018: CHO 161, TG 63, HDL 50, LDL-c 98 (rosuvsatatin 20mg 3x31mk)  Past Medical History:  Diagnosis Date  . Breast cancer (HCC)   .Aurorancer (HCC)   .Burtronary artery disease   . Depression   . DVT (deep venous thrombosis) (H[facility]) 11/[place]  left femerol artery injury during resusciation.  . GERD (Marland Kitchenastroesophageal reflux disease)   . HCAP (healthcare-associated pneumonia) 11/22/2014  . Heart murmur   . High cholesterol   . History of blood transfusion 09/2014   related to MI  . History of gout   . Hypertension   . Liver laceration 10/19/14  . Pulmonary edema 10/19/14  . Renal disorder   . Sleep apnea    "suppose to wear mask but I don't" (11/22/2014)  . STEMI (ST elevation myocardial infarction) (HCC) 11/[place]15  . Type II diabetes mellitus (H[facility])    [place] Medications on File Prior to Visit  Medication Sig Dispense Refill  . acetaminophen (TYLENOL) 500 MG tablet Take 1,000 mg by mouth 2 (two) times daily as needed  (pain).     . aspiriMarland Kitchen EC 81 MG tablet Take 1 tablet (81 mg total) by mouth daily.    . blood glucose meter kit and supplies KIT by Does not apply route daily as needed. Dispense based on patient and insurance preference. Use up to four times daily as directed. (FOR ICD-9 250.00, 250.01).    . BRILINTA 60 MG TABS tablet TAKE 1 TABLET (60 MG TOTAL) BY MOUTH 2 (TWO) TIMES DAILY. 180 tablet 2  . diclofenac sodium (VOLTAREN) 1 % GEL Apply 2 g topically 4 (four) times daily. (Patient taking differently: Apply 2 g topically 4 (four) times daily as needed (pain). ) 1 Tube 3  . exemestane (AROMASIN) 25 MG tablet Take 25 mg by mouth daily after breakfast.    . ezetimibe (ZETIA) 10 MG tablet Take 1 tablet (10 mg total) by mouth daily. 30 tablet 5  . furosemide (LASIX) 40 MG tablet TAKE 1 TABLET AS NEEDED FOR SHORTNESS OF BREATH OR WEIGHT GAIN OF 2 POUNDS IN 24 HOURS OR 5 POUNDS IN 1 WEEK 30 tablet 1  . glucose blood test strip 1 each by Other route as needed for other. Use as instructed    . glucose blood test strip Use as instructed. E11.5. To check blood glucose daily. 100 each 12  . HYDROcodone-acetaminophen (NORCO/VICODIN) 5-325 MG tablet Take 1 tablet  by mouth every 6 (six) hours as needed for severe pain. 10 tablet 0  . isosorbide-hydrALAZINE (BIDIL) 20-37.5 MG tablet Take 1 tablet by mouth 2 (two) times daily. 60 tablet 5  . loratadine (CLARITIN) 10 MG tablet Take 10 mg by mouth daily as needed for allergies.     [name] losartan (COZAAR) 25 MG tablet Take 1 tablet (25 mg total) by mouth daily. 90 tablet 1  . magnesium oxide (MAG-OX) 400 MG tablet TAKE 1 TABLET BY MOUTH EVERY DAY 30 tablet 1  . metFORMIN (GLUCOPHAGE-XR) 500 MG 24 hr tablet Take 500 mg by mouth daily with breakfast.     . metoprolol succinate (TOPROL-XL) 25 MG 24 hr tablet TAKE 1 TABLET (25 MG TOTAL) BY MOUTH DAILY. 30 tablet 11  . nitroGLYCERIN (NITROSTAT) 0.4 MG SL tablet Place 1 tablet (0.4 mg total) under the tongue every 5 (five) minutes  as needed for chest pain. max of 3 if no relief call 911 25 tablet PRN  . pantoprazole (PROTONIX) 40 MG tablet Take 1 tablet (40 mg total) by mouth daily. 30 tablet 11  . potassium chloride SA (K-DUR,KLOR-CON) 20 MEQ tablet Take 20 mEq by mouth daily.     . rosuvastatin (CRESTOR) 20 MG tablet TAKE 1 TABLET BY MOUTH 3 TIMES PER WEEK 30 tablet 2  . SUMAtriptan (IMITREX) 25 MG tablet Take one tablet at the onset of headache. May repeat in 2 hours if headache persists or recurs. 30 tablet 3   No current facility-administered medications on file prior to visit.     Allergies  Allergen Reactions  . Dilaudid [Hydromorphone Hcl] Anaphylaxis, Itching and Swelling  . Hydromorphone Anaphylaxis, Itching and Swelling  . Epinephrine Anxiety and Other (See Comments)    Severe anxiety  . Lactose Intolerance (Gi) Diarrhea    Hyperlipidemia LDL goal <70  LDL remains above goal for secondary prevention. Patient is currently taking highest tolerable statin dose plus ezetimibe.  Noted multiple trial with other stations in the past resulted in severe muscle pain.  We discussed PCSK9i MOA, indication, storage administration, common side effects, and prior authorization process. Positive lifestyle modification were also discussed during this visit.  Will initiate paperwork for Repatha/Praleunt prior authorization, and follow up as needed. Plan to repeat fasting blood work around the 5th injection of new medication. Due to leg pain, I will reduce rosuvastatin dose from 45m to 139m3x/week.   Aleksandar Duve Rodriguez-Guzman PharmD, BCPS, C[place]ipt[id number]/01/2018 8:31 PM

## 2018-08-03 ENCOUNTER — Encounter: Payer: Self-pay | Admitting: Pharmacist

## 2018-08-04 ENCOUNTER — Telehealth: Payer: Self-pay | Admitting: *Deleted

## 2018-08-04 MED ORDER — SPIRONOLACTONE 25 MG PO TABS: 25.0000 mg | ORAL_TABLET | Freq: Every day | ORAL | 5 refills | Status: DC

## 2018-08-04 NOTE — Telephone Encounter (Signed)
-----   Message from Skeet Latch, MD sent at 07/28/2018  6:14 AM EDT ----- Echo unchanged from prior.  Recommend starting spironolactone 25 mg daily.  Stop potassium.  Repeat basic metabolic panel in 1 week.  This should help with managing her fluid levels.

## 2018-08-04 NOTE — Telephone Encounter (Signed)
Advised patient of medication changes, verbalized understanding. Patient needed to reschedule follow up visit tomorrow to next week. Moved appointment as requested

## 2018-08-05 ENCOUNTER — Ambulatory Visit: Payer: BC Managed Care – PPO | Admitting: Cardiovascular Disease

## 2018-08-12 ENCOUNTER — Ambulatory Visit (INDEPENDENT_AMBULATORY_CARE_PROVIDER_SITE_OTHER): Payer: BC Managed Care – PPO | Admitting: Cardiovascular Disease

## 2018-08-12 ENCOUNTER — Encounter: Payer: Self-pay | Admitting: Cardiovascular Disease

## 2018-08-12 VITALS — BP 115/62 | HR 85 | Ht 62.0 in | Wt 193.8 lb

## 2018-08-12 DIAGNOSIS — Z5181 Encounter for therapeutic drug level monitoring: Secondary | ICD-10-CM

## 2018-08-12 DIAGNOSIS — I2510 Atherosclerotic heart disease of native coronary artery without angina pectoris: Secondary | ICD-10-CM | POA: Diagnosis not present

## 2018-08-12 DIAGNOSIS — Z955 Presence of coronary angioplasty implant and graft: Secondary | ICD-10-CM | POA: Diagnosis not present

## 2018-08-12 DIAGNOSIS — I10 Essential (primary) hypertension: Secondary | ICD-10-CM

## 2018-08-12 DIAGNOSIS — I5042 Chronic combined systolic (congestive) and diastolic (congestive) heart failure: Secondary | ICD-10-CM | POA: Diagnosis not present

## 2018-08-12 DIAGNOSIS — E7800 Pure hypercholesterolemia, unspecified: Secondary | ICD-10-CM

## 2018-08-12 NOTE — Progress Notes (Signed)
Cardiology Office Note   Date:  08/12/2018   ID:  Marie Tanner, DOB May 30, 1956, MRN [id number]  PCP:  Forrest Moron, MD  Cardiologist:   Skeet Latch, MD  Oncologist: Samuel Bouche, MD Radiation Oncologist: Ladona Horns, MD Surgical Oncologist: Nancie Neas, MD  No chief complaint on file.   History of Present Illness: Marie Tanner is a 62 y.o. female with CAD s/p STEMI, hypertension, chronic systolic and diastolic heart failure, OSA not on CPAP, diabetes, prior DVT, breast cancer, and mitral regurgitation who pres Ients for follow up.  In 2015 she had a STEMI and cardiac arrest. She was cared for at [facility]. She ultimately received 2 DES (LM/LAD and LCx).  She had a prolonged hospitalization requiring tracheostomy.  Her liver was lacerated during CPR.  She required surgical repair after femoral catheterization.  She also developed a DVT in the hospital.  One week after discharge she developed pneumonia and was admitted at [facility].  She has been seen in the hospital twice for chest pain and ruled out for MI.  She had a negative Myoview 05/2015.  She also had negative CT scans for PE.  Marie Tanner also underwent surgery for breast cancer on 02/21/16.  She completed her radiation in September 2017.  She was referred for sleep study which was abnormal.  However, she declined using the CPAP device and is in the process of being fitted for an oral airway.  Marie Tanner had an echo 06/2016 that showed LVEF 50-55% with grade 1 diastolic dysfunction.    Marie Tanner reported an episode of chest pain that occurred while driving.  She was referred for [facility] 11/24/17 that revealed LVEF 36% with anterior, anteroseptal, apical inferior and apical lateral defects.  There was concern for infarct with peri-infarct ischemia in the inferior and apical walls.  She underwent LHC 11/2017 that revealed patent stents that were heavily calcified and there was a step down after the takeoff  of the diagonal.  She had 40% RI, 40% proximal LCx and 20% prox LAD.  LVEF was 35-45% and LVEDP was elevated.  BiDil was added to her regimen for afterload reduction and poorly controlled hypertension.  At her last appointment she reported increased dyspnea on exertion and edema.  Lasix was increased for 2 days.  She was also referred for an echocardiogram 06/2018 that revealed LVEF 45 to 50% with hypokinesis of the mid to apical inferior and inferoseptal myocardium that was relatively unchanged from prior.  She was also started on spironolactone.  Since that time she has been feeling well.  She followed up with our pharmacist and is working on getting approved for PCSK9 inhibitor.   Past Medical History:  Diagnosis Date  . Breast cancer ([place])   . Cancer ([place])   . Coronary artery disease   . Depression   . DVT (deep venous thrombosis) ([place]) 11/15   left femerol artery injury during resusciation.  [name] GERD (gastroesophageal reflux disease)   . HCAP (healthcare-associated pneumonia) 11/22/2014  . Heart murmur   . High cholesterol   . History of blood transfusion 09/2014   related to MI  . History of gout   . Hypertension   . Liver laceration 10/19/14  . Pulmonary edema 10/19/14  . Renal disorder   . Sleep apnea    "suppose to wear mask but I don't" (11/22/2014)  . STEMI (ST elevation myocardial infarction) ([place]) 10/19/14  . Type II diabetes mellitus ([place])  Past Surgical History:  Procedure Laterality Date  . ABDOMINAL HYSTERECTOMY  ~ 2000  . APPLICATION OF WOUND VAC  10/2014   "over my naval" (11/22/2014)  . BREAST LUMPECTOMY WITH AXILLARY LYMPH NODE BIOPSY    . BREAST SURGERY    . [place]; 1982  . CORONARY ANGIOPLASTY WITH STENT PLACEMENT  09/2014   "multiple"/notes 11/22/2014  . EXTRACORPOREAL CIRCULATION    . Femerol artery repair Right 09/2014   pt insists it was a right femoral artery repair on 11/22/2014  . LAPAROSCOPIC ABDOMINAL EXPLORATION    . LEFT  HEART CATH AND CORONARY ANGIOGRAPHY N/A 12/07/2017   Procedure: LEFT HEART CATH AND CORONARY ANGIOGRAPHY;  Surgeon: Leonie Man, MD;  Location: [place] [facility];  Service: Cardiovascular;  Laterality: N/A;  . LEFT VENTRICULAR ASSIST DEVICE     pt is not aware of this hx on 11/22/2014  . Liver laceration repair  09/2014   related to CPR/notes 11/22/2014  . TRACHEOSTOMY  09/2014   "closed on it's own when they took it out"  . TUBAL LIGATION  1982     Current Outpatient Medications  Medication Sig Dispense Refill  . acetaminophen (TYLENOL) 500 MG tablet Take 1,000 mg by mouth 2 (two) times daily as needed (pain).     [name] aspirin EC 81 MG tablet Take 1 tablet (81 mg total) by mouth daily.    . blood glucose meter kit and supplies KIT by Does not apply route daily as needed. Dispense based on patient and insurance preference. Use up to four times daily as directed. (FOR ICD-9 250.00, 250.01).    . BRILINTA 60 MG TABS tablet TAKE 1 TABLET (60 MG TOTAL) BY MOUTH 2 (TWO) TIMES DAILY. 180 tablet 2  . diclofenac sodium (VOLTAREN) 1 % GEL Apply 2 g topically 4 (four) times daily. (Patient taking differently: Apply 2 g topically 4 (four) times daily as needed (pain). ) 1 Tube 3  . exemestane (AROMASIN) 25 MG tablet Take 25 mg by mouth daily after breakfast.    . furosemide (LASIX) 40 MG tablet TAKE 1 TABLET AS NEEDED FOR SHORTNESS OF BREATH OR WEIGHT GAIN OF 2 POUNDS IN 24 HOURS OR 5 POUNDS IN 1 WEEK 30 tablet 1  . glucose blood test strip 1 each by Other route as needed for other. Use as instructed    . glucose blood test strip Use as instructed. E11.5. To check blood glucose daily. 100 each 12  . HYDROcodone-acetaminophen (NORCO/VICODIN) 5-325 MG tablet Take 1 tablet by mouth every 6 (six) hours as needed for severe pain. 10 tablet 0  . isosorbide-hydrALAZINE (BIDIL) 20-37.5 MG tablet Take 1 tablet by mouth 2 (two) times daily. 60 tablet 5  . loratadine (CLARITIN) 10 MG tablet Take 10 mg by mouth  daily as needed for allergies.     [name] losartan (COZAAR) 25 MG tablet Take 1 tablet (25 mg total) by mouth daily. 90 tablet 1  . magnesium oxide (MAG-OX) 400 MG tablet TAKE 1 TABLET BY MOUTH EVERY DAY 30 tablet 1  . metFORMIN (GLUCOPHAGE-XR) 500 MG 24 hr tablet Take 500 mg by mouth daily with breakfast.     . metoprolol succinate (TOPROL-XL) 25 MG 24 hr tablet TAKE 1 TABLET (25 MG TOTAL) BY MOUTH DAILY. 30 tablet 11  . nitroGLYCERIN (NITROSTAT) 0.4 MG SL tablet Place 1 tablet (0.4 mg total) under the tongue every 5 (five) minutes as needed for chest pain. max of 3 if no relief  call 911 25 tablet PRN  . pantoprazole (PROTONIX) 40 MG tablet Take 1 tablet (40 mg total) by mouth daily. 30 tablet 11  . rosuvastatin (CRESTOR) 20 MG tablet Take 1/2 tablet by mouth 3 times per week 30 tablet 2  . spironolactone (ALDACTONE) 25 MG tablet Take 1 tablet (25 mg total) by mouth daily. 30 tablet 5  . SUMAtriptan (IMITREX) 25 MG tablet Take one tablet at the onset of headache. May repeat in 2 hours if headache persists or recurs. 30 tablet 3  . ezetimibe (ZETIA) 10 MG tablet Take 1 tablet (10 mg total) by mouth daily. 30 tablet 5   No current facility-administered medications for this visit.     Allergies:   Dilaudid [hydromorphone hcl]; Hydromorphone; Epinephrine; and Lactose intolerance (gi)    Social History:  The patient  reports that she has never smoked. She has never used smokeless tobacco. She reports that she does not drink alcohol or use drugs.   Family History:  The patient's family history includes Alzheimer's disease in her father; COPD in her mother; Diabetes in her brother and mother; Heart attack in her father; Hypertension in her mother.    ROS:  Please see the history of present illness.   Otherwise, review of systems are positive for none.   All other systems are reviewed and negative.    PHYSICAL EXAM: VS:  BP 115/62   Pulse 85   Ht _0  (1.575 m)   Wt 193 lb 12.8 oz (87.9 kg)   BMI  35.45 kg/m  , BMI Body mass index is 35.45 kg/m. GENERAL:  Well appearing HEENT: Pupils equal round and reactive, fundi not visualized, oral mucosa unremarkable NECK:  No jugular venous distention, waveform within normal limits, carotid upstroke brisk and symmetric, no bruits LUNGS:  Clear to auscultation bilaterally HEART:  RRR.  PMI not displaced or sustained,S1 and S2 within normal limits, no S3, no S4, no clicks, no rubs, II/VI systolic murmur at the LUSB ABD:  Flat, positive bowel sounds normal in frequency in pitch, no bruits, no rebound, no guarding, no midline pulsatile mass, no hepatomegaly, no splenomegaly EXT:  2 plus pulses throughout, no edema, no cyanosis no clubbing SKIN:  No rashes no nodules NEURO:  Cranial nerves II through XII grossly intact, motor grossly intact throughout PSYCH:  Cognitively intact, oriented to person place and time   EKG:  EKG is not ordered today. 02/11/16: Sinus arrhythmia.  Rate 71 bpm. Non-specific ST-T changes. 10/05/16: Sinus rhythm. Rate 85 bpm. Prior inferior infarct. 11/16/17: Sinus rhythm.  Rate 87 bpm.  Prior inferior infarct.  07/22/18: Sinu srhythm.  Rate 90 bpm.  Prior inferior infarct.    07/26/18: Study Conclusions  - Left ventricle: The cavity size was normal. Systolic function was   mildly reduced. The estimated ejection fraction was in the range   of 45% to 50%. Hypokinesis of the mid-apicalinferior and   inferoseptal myocardium. Doppler parameters are consistent with   abnormal left ventricular relaxation (grade 1 diastolic   dysfunction). Acoustic contrast opacification revealed no   evidence ofthrombus. - Mitral valve: There was moderate regurgitation directed   eccentrically and anteriorly. - Left atrium: The atrium was mildly dilated. - Atrial septum: No defect or patent foramen ovale was identified.  Impressions:  - Direct comparison to images from August 2917 shows no change in   wall motion pattern or overall LV  EF. [name]  Lexiscan Myoview 11/24/17:  The left ventricular ejection fraction is moderately  decreased (30-44%).  Nuclear stress EF: 36%.  There was no ST segment deviation noted during stress.  There is a large defect of severe severity present in the mid anterior, mid inferior, apical anterior, apical septal, apical inferior, apical lateral and apex location. The defect is partially reversible. This is consistent with infarct with small amount of peri infarct ischemia in the inferior and apical walls.  This is a high risk study.  Compared to prior study the EF has declined and there appears to be peri infarct ischemia in the inferior wall although this could also be variations in diaphragmatic attenuation artifact.   LHC 12/07/17:  Mid LM to Ost LAD DES Stent is 10% stenosed. -This entire segment is heavily calcified. There is a significant step down after the stent at the takeoff of the diagonal branches.  Prox LAD lesion is 20% stenosed.  Ost Ramus lesion is 40% stenosed. Proc Circumflex ~40%.  There is moderate left ventricular systolic dysfunction. The left ventricular ejection fraction is 35-45% by visual estimate.  LV end diastolic pressure is moderately elevated. 22-24 mmHg  Recent Labs: 10/28/2017: TSH 2.560 12/03/2017: Hemoglobin 12.1; Platelets 258 04/05/2018: ALT 11; BUN 13; Creatinine, Ser 0.68; Potassium 4.7; Sodium 140    Lipid Panel    Component Value Date/Time   CHOL 161 04/05/2018 0858   TRIG 63 04/05/2018 0858   HDL 50 04/05/2018 0858   CHOLHDL 3.2 04/05/2018 0858   CHOLHDL 3.1 10/05/2016 0937   VLDL 15 10/05/2016 0937   LDLCALC 98 04/05/2018 0858      Wt Readings from Last 3 Encounters:  08/12/18 193 lb 12.8 oz (87.9 kg)  07/22/18 190 lb (86.2 kg)  05/02/18 185 lb 6.4 oz (84.1 kg)      ASSESSMENT AND PLAN:  # CAD s/p STEMI: # Chest pain: Marie Tanner had an ST elevation myocardial infarction with stents placed in the LAD/left main and left  circumflex arteries.  She is on low dose ticagrelor indefinitely.  LHC 11/2017 showed patent stents with 20-40% lesions.  Chest pain resolved.  Continue aspirin, ticagrelor, Bidil, rosuvastatin, and metoprolol.    # Acute on chronic systolic and diastolic heart failure: LVEF 45-50%.  HF symptoms are better.  Continue losartan, Bidil, carvedilol, and spironolactone.    # Hypertensive heart disease: BP well-controlled on Bidil, losartan, spironolactone, and metoprolol.  # Hyperlipidemia:  Rosuvastatin was reduced due to muscle cramping. Awaiting PCSK9 inhibitor.   # OSA: Oral airway device.     Current medicines are reviewed at length with the patient today.  The patient does not have concerns regarding medicines.  The following changes have been made: none  Labs/ tests ordered today include:   Orders Placed This Encounter  Procedures  . Basic metabolic panel     Disposition:   FU with Jerris Fleer C. Oval Linsey, MD, [facility] in 3 months.      Signed, Rayven Hendrickson C. Oval Linsey, MD, [facility]  08/12/2018 1:17 PM    [facility]

## 2018-08-12 NOTE — Patient Instructions (Signed)
Medication Instructions:  Your physician recommends that you continue on your current medications as directed. Please refer to the Current Medication list given to you today.  Labwork: BMET TODAY   Testing/Procedures: NONE   Follow-Up: Your physician recommends that you schedule a follow-up appointment in: 3 MONTH  If you need a refill on your cardiac medications before your next appointment, please call your pharmacy.

## 2018-08-13 LAB — BASIC METABOLIC PANEL
BUN / CREAT RATIO: 13 (ref 12–28)
BUN: 9 mg/dL (ref 8–27)
CHLORIDE: 103 mmol/L (ref 96–106)
CO2: 23 mmol/L (ref 20–29)
Calcium: 9.6 mg/dL (ref 8.7–10.3)
Creatinine, Ser: 0.72 mg/dL (ref 0.57–1.00)
GFR calc Af Amer: 105 mL/min/{1.73_m2} (ref >59)
GFR calc non Af Amer: 91 mL/min/{1.73_m2} (ref >59)
Glucose: 110 mg/dL — ABNORMAL HIGH (ref 65–99)
Potassium: 4.6 mmol/L (ref 3.5–5.2)
SODIUM: 140 mmol/L (ref 134–144)

## 2018-08-14 IMAGING — CR DG CHEST 2V
2 series · 2 of 2 positions shown · non-contrast
Comparison: 04/10/2016

CLINICAL DATA: Chest pain, shortness of Breath

EXAM:
CHEST  2 VIEW

[chest pa]
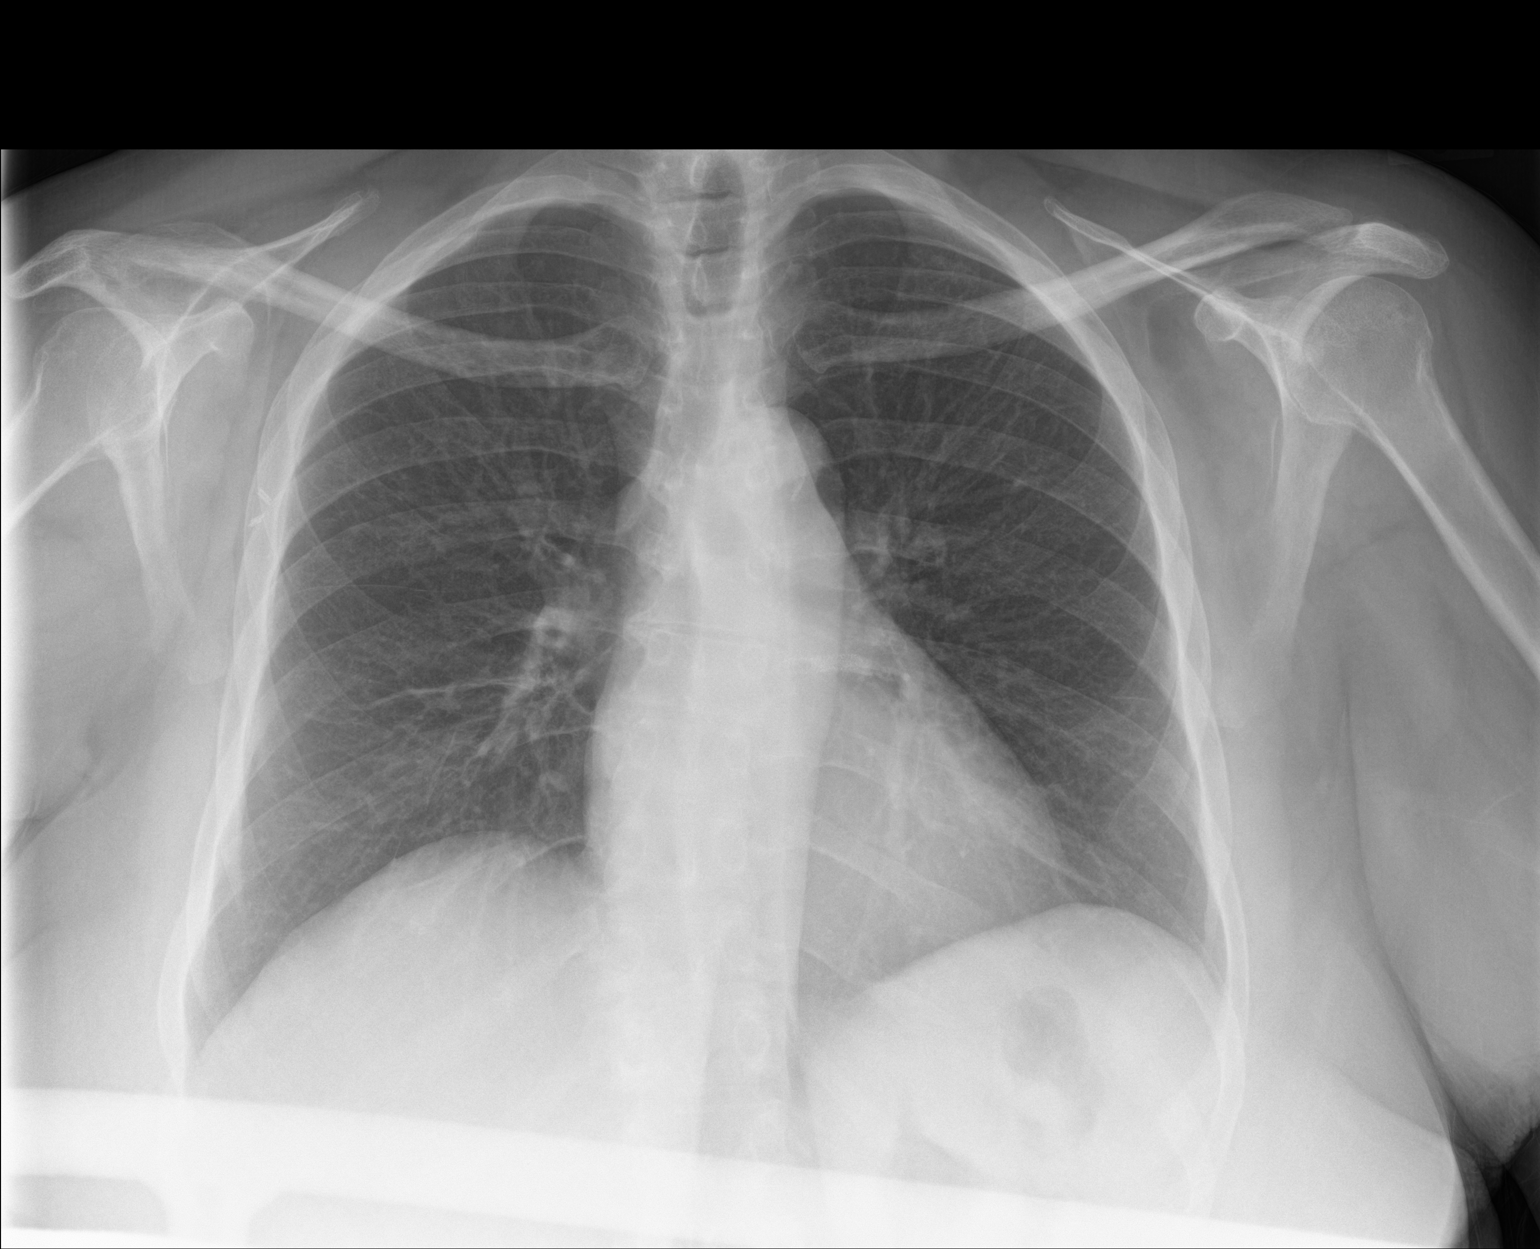

[chest lat]
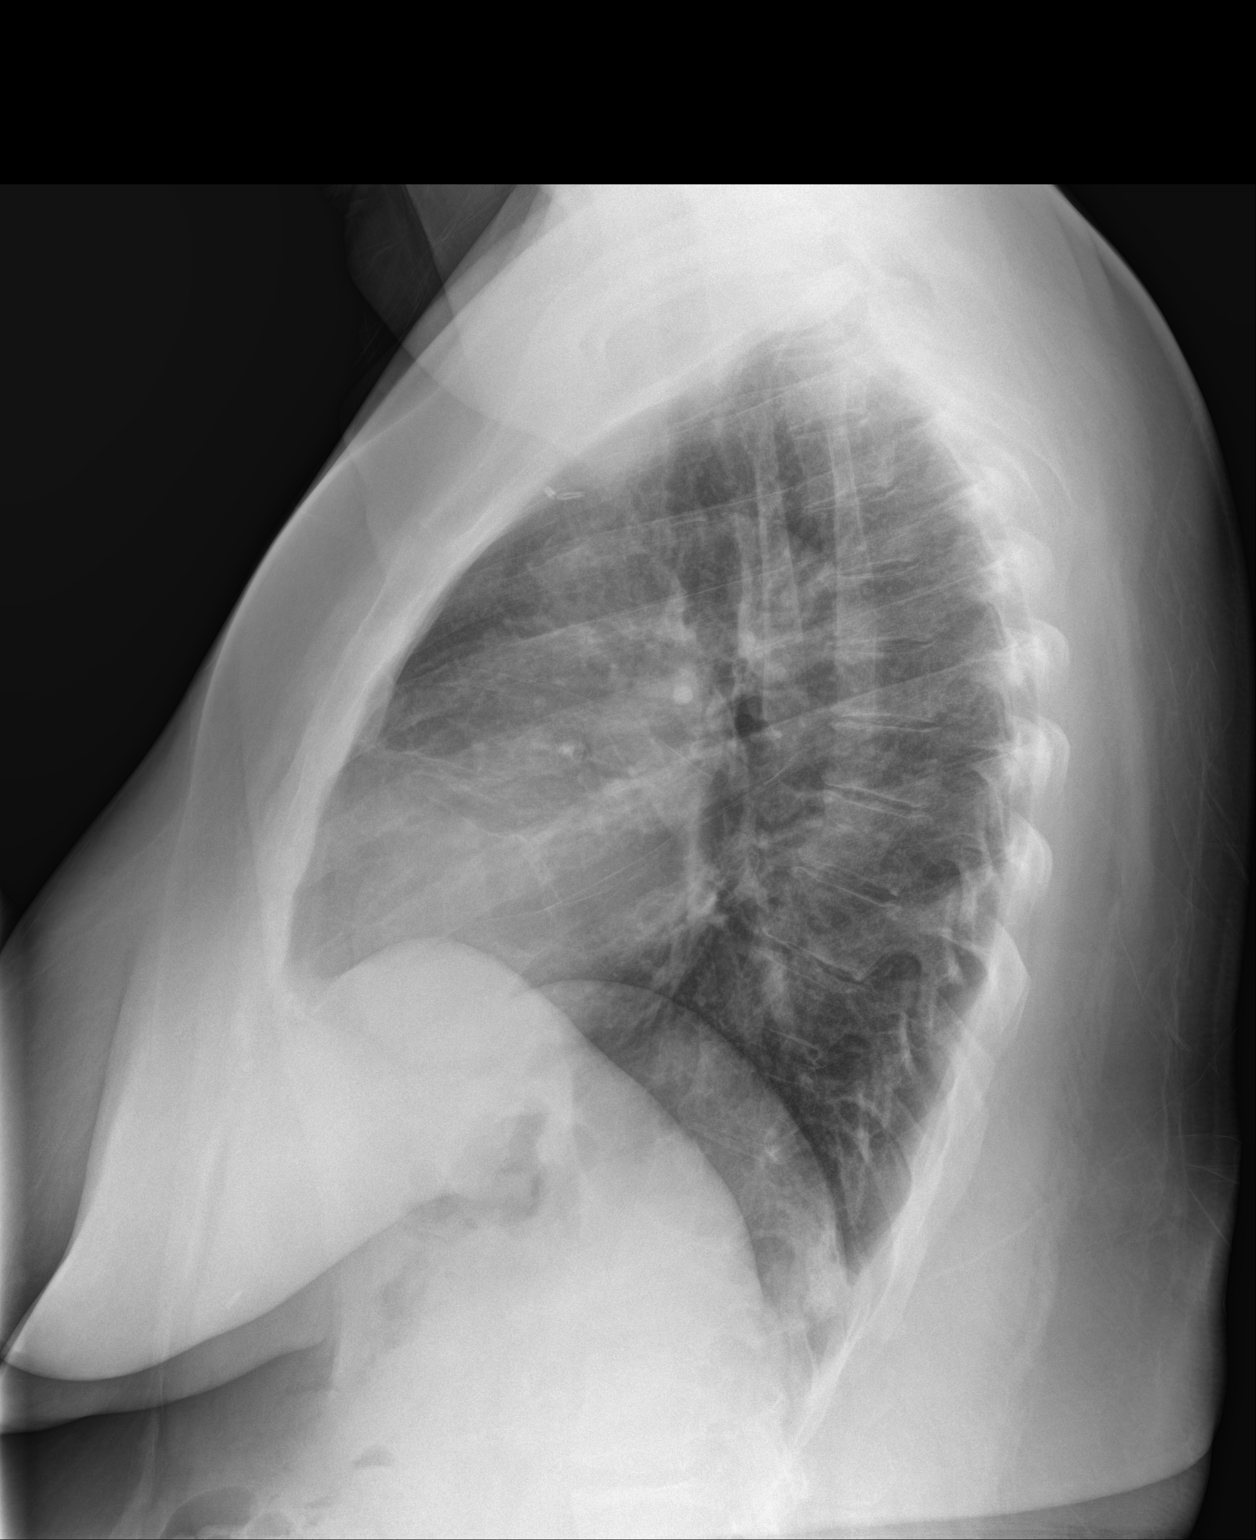

[2 of 2 positions shown; findings below may reference images not displayed]

FINDINGS: Cardiomediastinal silhouette is stable. Surgical clips in right
axilla again noted. No acute infiltrate or pleural effusion. No
pulmonary edema. Left Port-A-Cath has been removed.
IMPRESSION: No active cardiopulmonary disease.

## 2018-08-16 ENCOUNTER — Telehealth: Payer: Self-pay | Admitting: Pharmacist

## 2018-08-16 MED ORDER — EVOLOCUMAB 140 MG/ML ~~LOC~~ SOAJ: 140.0000 mg | SUBCUTANEOUS | 11 refills | Status: DC

## 2018-08-16 NOTE — Telephone Encounter (Signed)
PA for repatha 140mg  approved. Rx sent to prefer pharmacy. Patient aware.

## 2018-10-03 ENCOUNTER — Ambulatory Visit: Payer: BC Managed Care – PPO | Admitting: Family Medicine

## 2018-10-03 ENCOUNTER — Encounter: Payer: Self-pay | Admitting: Family Medicine

## 2018-10-03 ENCOUNTER — Other Ambulatory Visit: Payer: Self-pay

## 2018-10-03 VITALS — BP 124/76 | HR 60 | Temp 98.5°F | Resp 17 | Ht 62.0 in | Wt 190.8 lb

## 2018-10-03 DIAGNOSIS — E1169 Type 2 diabetes mellitus with other specified complication: Secondary | ICD-10-CM

## 2018-10-03 DIAGNOSIS — Z23 Encounter for immunization: Secondary | ICD-10-CM

## 2018-10-03 LAB — POCT GLYCOSYLATED HEMOGLOBIN (HGB A1C): Hemoglobin A1C: 6.1 % — AB (ref 4.0–5.6)

## 2018-10-03 NOTE — Patient Instructions (Addendum)
If you have lab work done today you will be contacted with your lab results within the next 2 weeks.  If you have not heard from [place] then please contact us. The fastest way to get your results is to register for My Chart.   IF you received an x-ray today, you will receive an invoice from [facility]. Please contact [facility] at [phone] with questions or concerns regarding your invoice.   IF you received labwork today, you will receive an invoice from [facility]. Please contact [facility] at [phone] with questions or concerns regarding your invoice.   Our billing staff will not be able to assist you with questions regarding bills from these companies.  You will be contacted with the lab results as soon as they are available. The fastest way to get your results is to activate your My Chart account. Instructions are located on the last page of this paperwork. If you have not heard from [place] regarding the results in 2 weeks, please contact this office.     Hyperglycemia Hyperglycemia occurs when the level of sugar (glucose) in the blood is too high. Glucose is a type of sugar that provides the body's main source of energy. Certain hormones (insulin and glucagon) control the level of glucose in the blood. Insulin lowers blood glucose, and glucagon increases blood glucose. Hyperglycemia can result from having too little insulin in the bloodstream, or from the body not responding normally to insulin. Hyperglycemia occurs most often in people who have diabetes (diabetes mellitus), but it can happen in people who do not have diabetes. It can develop quickly, and it can be life-threatening if it causes you to become severely dehydrated (diabetic ketoacidosis or hyperglycemic hyperosmolar state). Severe hyperglycemia is a medical emergency. What are the causes? If you have diabetes, hyperglycemia may be caused by:  Diabetes medicine.  Medicines that increase blood glucose or  affect your diabetes control.  Not eating enough, or not eating often enough.  Changes in physical activity level.  Being sick or having an infection.  If you have prediabetes or undiagnosed diabetes:  Hyperglycemia may be caused by those conditions.  If you do not have diabetes, hyperglycemia may be caused by:  Certain medicines, including steroid medicines, beta-blockers, epinephrine, and thiazide diuretics.  Stress.  Serious illness.  Surgery.  Diseases of the pancreas.  Infection.  What increases the risk? Hyperglycemia is more likely to develop in people who have risk factors for diabetes, such as:  Having a family member with diabetes.  Having a gene for type 1 diabetes that is passed from parent to child (inherited).  Living in an area with cold weather conditions.  Exposure to certain viruses.  Certain conditions in which the body's disease-fighting (immune) system attacks itself (autoimmune disorders).  Being overweight or obese.  Having an inactive (sedentary) lifestyle.  Having been diagnosed with insulin resistance.  Having a history of prediabetes, gestational diabetes, or polycystic ovarian syndrome (PCOS).  Being of American-Indian, African-American, Hispanic/Latino, or Asian/Pacific Islander descent.  What are the signs or symptoms? Hyperglycemia may not cause any symptoms. If you do have symptoms, they may include early warning signs, such as:  Increased thirst.  Hunger.  Feeling very tired.  Needing to urinate more often than usual.  Blurry vision.  Other symptoms may develop if hyperglycemia gets worse, such as:  Dry mouth.  Loss of appetite.  Fruity-smelling breath.  Weakness.  Unexpected or rapid weight gain or weight loss.  Tingling  or numbness in the hands or feet.  Headache.  Skin that does not quickly return to normal after being lightly pinched and released (poor skin turgor).  Abdominal pain.  Cuts or bruises  that are slow to heal.  How is this diagnosed? Hyperglycemia is diagnosed with a blood test to measure your blood glucose level. This blood test is usually done while you are having symptoms. Your health care provider may also do a physical exam and review your medical history. You may have more tests to determine the cause of your hyperglycemia, such as:  A fasting blood glucose (FBG) test. You will not be allowed to eat (you will fast) for at least 8 hours before a blood sample is taken.  An A1c (hemoglobin A1c) blood test. This provides information about blood glucose control over the previous 2-3 months.  An oral glucose tolerance test (OGTT). This measures your blood glucose at two times: ? After fasting. This is your baseline blood glucose level. ? Two hours after drinking a beverage that contains glucose.  How is this treated? Treatment depends on the cause of your hyperglycemia. Treatment may include:  Taking medicine to regulate your blood glucose levels. If you take insulin or other diabetes medicines, your medicine or dosage may be adjusted.  Lifestyle changes, such as exercising more, eating healthier foods, or losing weight.  Treating an illness or infection, if this caused your hyperglycemia.  Checking your blood glucose more often.  Stopping or reducing steroid medicines, if these caused your hyperglycemia.  If your hyperglycemia becomes severe and it results in hyperglycemic hyperosmolar state, you must be hospitalized and given IV fluids. Follow these instructions at home: General instructions  Take over-the-counter and prescription medicines only as told by your health care provider.  Do not use any products that contain nicotine or tobacco, such as cigarettes and e-cigarettes. If you need help quitting, ask your health care provider.  Limit alcohol intake to no more than 1 drink per day for nonpregnant women and 2 drinks per day for men. One drink equals 12 oz of  beer, 5 oz of wine, or 1 oz of hard liquor.  Learn to manage stress. If you need help with this, ask your health care provider.  Keep all follow-up visits as told by your health care provider. This is important. Eating and drinking  Maintain a healthy weight.  Exercise regularly, as directed by your health care provider.  Stay hydrated, especially when you exercise, get sick, or spend time in hot temperatures.  Eat healthy foods, such as: ? Lean proteins. ? Complex carbohydrates. ? Fresh fruits and vegetables. ? Low-fat dairy products. ? Healthy fats.  Drink enough fluid to keep your urine clear or pale yellow. If you have diabetes:   Make sure you know the symptoms of hyperglycemia.  Follow your diabetes management plan, as told by your health care provider. Make sure you: ? Take your insulin and medicines as directed. ? Follow your exercise plan. ? Follow your meal plan. Eat on time, and do not skip meals. ? Check your blood glucose as often as directed. Make sure to check your blood glucose before and after exercise. If you exercise longer or in a different way than usual, check your blood glucose more often. ? Follow your sick day plan whenever you cannot eat or drink normally. Make this plan in advance with your health care provider.  Share your diabetes management plan with people in your workplace, school, and household.  Check your urine for ketones when you are ill and as told by your health care provider.  Carry a medical alert card or wear medical alert jewelry. Contact a health care provider if:  Your blood glucose is at or above 240 mg/dL (13.3 mmol/L) for 2 days in a row.  You have problems keeping your blood glucose in your target range.  You have frequent episodes of hyperglycemia. Get help right away if:  You have difficulty breathing.  You have a change in how you think, feel, or act (mental status).  You have nausea or vomiting that does not go  away. These symptoms may represent a serious problem that is an emergency. Do not wait to see if the symptoms will go away. Get medical help right away. Call your local emergency services (911 in the [place] Do not drive yourself to the hospital. Summary  Hyperglycemia occurs when the level of sugar (glucose) in the blood is too high.  Hyperglycemia is diagnosed with a blood test to measure your blood glucose level. This blood test is usually done while you are having symptoms. Your health care provider may also do a physical exam and review your medical history.  If you have diabetes, follow your diabetes management plan as told by your health care provider.  Contact your health care provider if you have problems keeping your blood glucose in your target range. This information is not intended to replace advice given to you by your health care provider. Make sure you discuss any questions you have with your health care provider. Document Released: 05/12/2001 Document Revised: 08/03/2016 Document Reviewed: 08/03/2016 Elsevier Interactive Patient Education  [employer].

## 2018-10-03 NOTE — Progress Notes (Signed)
Chief Complaint  Patient presents with  . Diabetes    5 month f/u    HPI   Pt reports that her sugars have been 99 fasting Postprandial around 210 She reports that she has been doing her metformin and sticking to a diabetic diet She received her flu vaccination She is taking losartan (an ARB) She had an eye exam in the past 24 months She goes to [place] for O[facility]  Lab Results  Component Value Date   HGBA1C 6.1 (A) 10/03/2018    Lab Results  Component Value Date   CREATININE 0.72 08/12/2018   Depression screen [facility] 2/9 10/03/2018 05/02/2018 01/03/2018 12/02/2017 10/28/2017  Decreased Interest 0 0 0 0 0  Down, Depressed, Hopeless 0 0 0 0 0  PHQ - 2 Score 0 0 0 0 0     Past Medical History:  Diagnosis Date  . Breast cancer ([place])   . Cancer ([place])   . Coronary artery disease   . Depression   . DVT (deep venous thrombosis) ([place]) 11/15   left femerol artery injury during resusciation.  [name] GERD (gastroesophageal reflux disease)   . HCAP (healthcare-associated pneumonia) 11/22/2014  . Heart murmur   . High cholesterol   . History of blood transfusion 09/2014   related to MI  . History of gout   . Hypertension   . Liver laceration 10/19/14  . Pulmonary edema 10/19/14  . Renal disorder   . Sleep apnea    "suppose to wear mask but I don't" (11/22/2014)  . STEMI (ST elevation myocardial infarction) ([place]) 10/19/14  . Type II diabetes mellitus ([place])     Current Outpatient Medications  Medication Sig Dispense Refill  . acetaminophen (TYLENOL) 500 MG tablet Take 1,000 mg by mouth 2 (two) times daily as needed (pain).     [name] aspirin EC 81 MG tablet Take 1 tablet (81 mg total) by mouth daily.    . blood glucose meter kit and supplies KIT by Does not apply route daily as needed. Dispense based on patient and insurance preference. Use up to four times daily as directed. (FOR ICD-9 250.00, 250.01).    . BRILINTA 60 MG TABS tablet TAKE 1 TABLET (60 MG TOTAL) BY MOUTH 2 (TWO) TIMES  DAILY. 180 tablet 2  . diclofenac sodium (VOLTAREN) 1 % GEL Apply 2 g topically 4 (four) times daily. (Patient taking differently: Apply 2 g topically 4 (four) times daily as needed (pain). ) 1 Tube 3  . exemestane (AROMASIN) 25 MG tablet Take 25 mg by mouth daily after breakfast.    . furosemide (LASIX) 40 MG tablet TAKE 1 TABLET AS NEEDED FOR SHORTNESS OF BREATH OR WEIGHT GAIN OF 2 POUNDS IN 24 HOURS OR 5 POUNDS IN 1 WEEK 30 tablet 1  . glucose blood test strip 1 each by Other route as needed for other. Use as instructed    . glucose blood test strip Use as instructed. E11.5. To check blood glucose daily. 100 each 12  . HYDROcodone-acetaminophen (NORCO/VICODIN) 5-325 MG tablet Take 1 tablet by mouth every 6 (six) hours as needed for severe pain. 10 tablet 0  . isosorbide-hydrALAZINE (BIDIL) 20-37.5 MG tablet Take 1 tablet by mouth 2 (two) times daily. 60 tablet 5  . loratadine (CLARITIN) 10 MG tablet Take 10 mg by mouth daily as needed for allergies.     [name] losartan (COZAAR) 25 MG tablet Take 1 tablet (25 mg total) by mouth daily. 90 tablet 1  . magnesium  oxide (MAG-OX) 400 MG tablet TAKE 1 TABLET BY MOUTH EVERY DAY 30 tablet 1  . metFORMIN (GLUCOPHAGE-XR) 500 MG 24 hr tablet Take 500 mg by mouth daily with breakfast.     . metoprolol succinate (TOPROL-XL) 25 MG 24 hr tablet TAKE 1 TABLET (25 MG TOTAL) BY MOUTH DAILY. 30 tablet 11  . nitroGLYCERIN (NITROSTAT) 0.4 MG SL tablet Place 1 tablet (0.4 mg total) under the tongue every 5 (five) minutes as needed for chest pain. max of 3 if no relief call 911 25 tablet PRN  . pantoprazole (PROTONIX) 40 MG tablet Take 1 tablet (40 mg total) by mouth daily. 30 tablet 11  . rosuvastatin (CRESTOR) 20 MG tablet Take 1/2 tablet by mouth 3 times per week 30 tablet 2  . spironolactone (ALDACTONE) 25 MG tablet Take 1 tablet (25 mg total) by mouth daily. 30 tablet 5  . SUMAtriptan (IMITREX) 25 MG tablet Take one tablet at the onset of headache. May repeat in 2 hours  if headache persists or recurs. 30 tablet 3  . Evolocumab (REPATHA SURECLICK) 173 MG/ML SOAJ Inject 140 mg into the skin every 14 (fourteen) days. (Patient not taking: Reported on 10/03/2018) 2 pen 11  . ezetimibe (ZETIA) 10 MG tablet Take 1 tablet (10 mg total) by mouth daily. 30 tablet 5   No current facility-administered medications for this visit.     Allergies:  Allergies  Allergen Reactions  . Dilaudid [Hydromorphone Hcl] Anaphylaxis, Itching and Swelling  . Hydromorphone Anaphylaxis, Itching and Swelling  . Epinephrine Anxiety and Other (See Comments)    Severe anxiety  . Lactose Intolerance (Gi) Diarrhea    Past Surgical History:  Procedure Laterality Date  . ABDOMINAL HYSTERECTOMY  ~ 2000  . APPLICATION OF WOUND VAC  10/2014   "over my naval" (11/22/2014)  . BREAST LUMPECTOMY WITH AXILLARY LYMPH NODE BIOPSY    . BREAST SURGERY    . [place]; 1982  . CORONARY ANGIOPLASTY WITH STENT PLACEMENT  09/2014   "multiple"/notes 11/22/2014  . EXTRACORPOREAL CIRCULATION    . Femerol artery repair Right 09/2014   pt insists it was a right femoral artery repair on 11/22/2014  . LAPAROSCOPIC ABDOMINAL EXPLORATION    . LEFT HEART CATH AND CORONARY ANGIOGRAPHY N/A 12/07/2017   Procedure: LEFT HEART CATH AND CORONARY ANGIOGRAPHY;  Surgeon: Marie Man, MD;  Location: [place] [facility];  Service: Cardiovascular;  Laterality: N/A;  . LEFT VENTRICULAR ASSIST DEVICE     pt is not aware of this hx on 11/22/2014  . Liver laceration repair  09/2014   related to CPR/notes 11/22/2014  . TRACHEOSTOMY  09/2014   "closed on it's own when they took it out"  . TUBAL LIGATION  1982    Social History   Socioeconomic History  . Marital status: Divorced    Spouse name: Not on file  . Number of children: Not on file  . Years of education: Not on file  . Highest education level: Not on file  Occupational History  . Not on file  Social Needs  . Financial resource strain: Not  on file  . Food insecurity:    Worry: Not on file    Inability: Not on file  . Transportation needs:    Medical: Not on file    Non-medical: Not on file  Tobacco Use  . Smoking status: Never Smoker  . Smokeless tobacco: Never Used  Substance and Sexual Activity  . Alcohol use: No  .  Drug use: No  . Sexual activity: Not on file  Lifestyle  . Physical activity:    Days per week: Not on file    Minutes per session: Not on file  . Stress: Not on file  Relationships  . Social connections:    Talks on phone: Not on file    Gets together: Not on file    Attends religious service: Not on file    Active member of club or organization: Not on file    Attends meetings of clubs or organizations: Not on file    Relationship status: Not on file  Other Topics Concern  . Not on file  Social History Narrative  . Not on file    Family History  Problem Relation Age of Onset  . Diabetes Mother   . Hypertension Mother   . COPD Mother   . Heart attack Father   . Alzheimer's disease Father   . Diabetes Brother   . Stroke Neg Hx      ROS Review of Systems See HPI Constitution: No fevers or chills No malaise No diaphoresis Skin: No rash or itching Eyes: no blurry vision, no double vision GU: no dysuria or hematuria Neuro: no dizziness or headaches all others reviewed and negative   Objective: Vitals:   10/03/18 0925  BP: 124/76  Pulse: 60  Resp: 17  Temp: 98.5 F (36.9 C)  TempSrc: Oral  SpO2: 98%  Weight: 190 lb 12.8 oz (86.5 kg)  Height: 5' 2"  (1.575 m)    Physical Exam  Constitutional: She is oriented to person, place, and time. She appears well-developed and well-nourished.  HENT:  Head: Normocephalic and atraumatic.  Eyes: Conjunctivae and EOM are normal.  Cardiovascular: Normal rate, regular rhythm and normal heart sounds.  Pulmonary/Chest: Effort normal and breath sounds normal. No stridor. No respiratory distress. She has no wheezes.  Neurological: She is  alert and oriented to person, place, and time.     Assessment and Plan Marie Tanner was seen today for diabetes.  Diagnoses and all orders for this visit:  Type 2 diabetes mellitus with other specified complication, unspecified whether long term insulin use (HCC) -     POCT glycosylated hemoglobin (Hb A1C) -  discussed exercise program  -  Encouraged weight loss -  Continue metformin  Need for prophylactic vaccination and inoculation against influenza  Other orders -     Flu Vaccine QUAD 36+ mos IM     Marie Tanner A Marie Tanner

## 2018-10-26 ENCOUNTER — Other Ambulatory Visit: Payer: Self-pay | Admitting: Cardiovascular Disease

## 2018-10-29 ENCOUNTER — Other Ambulatory Visit: Payer: Self-pay | Admitting: Family Medicine

## 2018-10-30 ENCOUNTER — Other Ambulatory Visit: Payer: Self-pay | Admitting: Cardiovascular Disease

## 2018-11-09 ENCOUNTER — Encounter: Payer: Self-pay | Admitting: Cardiovascular Disease

## 2018-11-09 ENCOUNTER — Ambulatory Visit (INDEPENDENT_AMBULATORY_CARE_PROVIDER_SITE_OTHER): Payer: BC Managed Care – PPO | Admitting: Cardiovascular Disease

## 2018-11-09 VITALS — BP 130/78 | HR 87 | Ht 62.5 in | Wt 185.0 lb

## 2018-11-09 DIAGNOSIS — I110 Hypertensive heart disease with heart failure: Secondary | ICD-10-CM

## 2018-11-09 DIAGNOSIS — E785 Hyperlipidemia, unspecified: Secondary | ICD-10-CM

## 2018-11-09 DIAGNOSIS — Z955 Presence of coronary angioplasty implant and graft: Secondary | ICD-10-CM

## 2018-11-09 DIAGNOSIS — R079 Chest pain, unspecified: Secondary | ICD-10-CM

## 2018-11-09 DIAGNOSIS — R0789 Other chest pain: Secondary | ICD-10-CM

## 2018-11-09 DIAGNOSIS — E876 Hypokalemia: Secondary | ICD-10-CM

## 2018-11-09 DIAGNOSIS — I5043 Acute on chronic combined systolic (congestive) and diastolic (congestive) heart failure: Secondary | ICD-10-CM

## 2018-11-09 DIAGNOSIS — I5032 Chronic diastolic (congestive) heart failure: Secondary | ICD-10-CM

## 2018-11-09 LAB — BASIC METABOLIC PANEL
BUN/Creatinine Ratio: 21 (ref 12–28)
BUN: 14 mg/dL (ref 8–27)
CALCIUM: 9.6 mg/dL (ref 8.7–10.3)
CHLORIDE: 102 mmol/L (ref 96–106)
CO2: 23 mmol/L (ref 20–29)
Creatinine, Ser: 0.67 mg/dL (ref 0.57–1.00)
GFR calc Af Amer: 110 mL/min/{1.73_m2} (ref >59)
GFR, EST NON AFRICAN AMERICAN: 96 mL/min/{1.73_m2} (ref >59)
Glucose: 89 mg/dL (ref 65–99)
POTASSIUM: 4.9 mmol/L (ref 3.5–5.2)
Sodium: 139 mmol/L (ref 134–144)

## 2018-11-09 NOTE — Progress Notes (Signed)
Cardiology Office Note   Date:  11/09/2018   ID:  Marie Tanner, DOB March 03, 1956, MRN [id number]  PCP:  Forrest Moron, MD  Cardiologist:   Skeet Latch, MD  Oncologist: Samuel Bouche, MD Radiation Oncologist: Ladona Horns, MD Surgical Oncologist: Nancie Neas, MD  No chief complaint on file.   History of Present Illness: Marie Tanner is a 62 y.o. female with CAD s/p STEMI, hypertension, chronic systolic and diastolic heart failure, OSA not on CPAP, diabetes, prior DVT, breast cancer, and mitral regurgitation who pres Ients for follow up.  In 2015 she had a STEMI and cardiac arrest. She was cared for at [facility]. She ultimately received 2 DES (LM/LAD and LCx).  She had a prolonged hospitalization requiring tracheostomy.  Her liver was lacerated during CPR.  She required surgical repair after femoral catheterization.  She also developed a DVT in the hospital.  One week after discharge she developed pneumonia and was admitted at [facility].  She has been seen in the hospital twice for chest pain and ruled out for MI.  She had a negative Myoview 05/2015.  She also had negative CT scans for PE.  Marie Tanner also underwent surgery for breast cancer on 02/21/16.  She completed her radiation in September 2017.  She was referred for sleep study which was abnormal.  However, she declined using the CPAP device and is in the process of being fitted for an oral airway.  Marie Tanner had an echo 06/2016 that showed LVEF 50-55% with grade 1 diastolic dysfunction.    Marie Tanner reported an episode of chest pain that occurred while driving.  She was referred for [facility] 11/24/17 that revealed LVEF 36% with anterior, anteroseptal, apical inferior and apical lateral defects.  There was concern for infarct with peri-infarct ischemia in the inferior and apical walls.  She underwent LHC 11/2017 that revealed patent stents that were heavily calcified and there was a step down after the takeoff  of the diagonal.  She had 40% RI, 40% proximal LCx and 20% prox LAD.  LVEF was 35-45% and LVEDP was elevated.  BiDil was added to her regimen for afterload reduction and poorly controlled hypertension.  She reported increased dyspnea on exertion and edema.  Lasix was increased for 2 days.  She was also referred for an echocardiogram 06/2018 that revealed LVEF 45 to 50% with hypokinesis of the mid to apical inferior and inferoseptal myocardium that was relatively unchanged from prior.  She was also started on spironolactone.  Since that time she has been feeling well.    Since her last appointment Marie Tanner started on Repatha.  She has not had any side effects.  She has noted that she is had more chest pain lately.  She associates it with being more tired and not sleeping well.  It occurs randomly throughout the day.  Sometimes it occurs when she is walking some, sometimes when sitting.  It happens once or twice per week and last for less than 5 minutes.  It is sometimes associated with shortness of breath and nausea.  She has not been getting much exercise lately.  Holidays are hard time for her.  She also notes that she feels unsteady when she first stands up in the mornings.  She denies any syncope or falls.  She also reports increased cramping since her potassium supplement was discontinued.  She denies lower extremity edema, orthopnea, or PND.   Past Medical History:  Diagnosis Date  .  Breast cancer ([place])   . Cancer ([place])   . Coronary artery disease   . Depression   . DVT (deep venous thrombosis) ([place]) 11/15   left femerol artery injury during resusciation.  [name] GERD (gastroesophageal reflux disease)   . HCAP (healthcare-associated pneumonia) 11/22/2014  . Heart murmur   . High cholesterol   . History of blood transfusion 09/2014   related to MI  . History of gout   . Hypertension   . Liver laceration 10/19/14  . Pulmonary edema 10/19/14  . Renal disorder   . Sleep apnea    "suppose to  wear mask but I don't" (11/22/2014)  . STEMI (ST elevation myocardial infarction) ([place]) 10/19/14  . Type II diabetes mellitus ([place])     Past Surgical History:  Procedure Laterality Date  . ABDOMINAL HYSTERECTOMY  ~ 2000  . APPLICATION OF WOUND VAC  10/2014   "over my naval" (11/22/2014)  . BREAST LUMPECTOMY WITH AXILLARY LYMPH NODE BIOPSY    . BREAST SURGERY    . [place]; 1982  . CORONARY ANGIOPLASTY WITH STENT PLACEMENT  09/2014   "multiple"/notes 11/22/2014  . EXTRACORPOREAL CIRCULATION    . Femerol artery repair Right 09/2014   pt insists it was a right femoral artery repair on 11/22/2014  . LAPAROSCOPIC ABDOMINAL EXPLORATION    . LEFT HEART CATH AND CORONARY ANGIOGRAPHY N/A 12/07/2017   Procedure: LEFT HEART CATH AND CORONARY ANGIOGRAPHY;  Surgeon: Leonie Man, MD;  Location: [place] [facility];  Service: Cardiovascular;  Laterality: N/A;  . LEFT VENTRICULAR ASSIST DEVICE     pt is not aware of this hx on 11/22/2014  . Liver laceration repair  09/2014   related to CPR/notes 11/22/2014  . TRACHEOSTOMY  09/2014   "closed on it's own when they took it out"  . TUBAL LIGATION  1982     Current Outpatient Medications  Medication Sig Dispense Refill  . acetaminophen (TYLENOL) 500 MG tablet Take 1,000 mg by mouth 2 (two) times daily as needed (pain).     [name] aspirin EC 81 MG tablet Take 1 tablet (81 mg total) by mouth daily.    [name] BIDIL 20-37.5 MG tablet TAKE 1 TABLET BY MOUTH TWICE A DAY 60 tablet 5  . blood glucose meter kit and supplies KIT by Does not apply route daily as needed. Dispense based on patient and insurance preference. Use up to four times daily as directed. (FOR ICD-9 250.00, 250.01).    . BRILINTA 60 MG TABS tablet TAKE 1 TABLET (60 MG TOTAL) BY MOUTH 2 (TWO) TIMES DAILY. 180 tablet 2  . diclofenac sodium (VOLTAREN) 1 % GEL Apply 2 g topically 4 (four) times daily. (Patient taking differently: Apply 2 g topically 4 (four) times daily as needed  (pain). ) 1 Tube 3  . Evolocumab (REPATHA SURECLICK) 592 MG/ML SOAJ Inject 140 mg into the skin every 14 (fourteen) days. 2 pen 11  . exemestane (AROMASIN) 25 MG tablet Take 25 mg by mouth daily after breakfast.    . furosemide (LASIX) 40 MG tablet TAKE 1 TABLET AS NEEDED FOR SHORTNESS OF BREATH OR WEIGHT GAIN OF 2 POUNDS IN 24 HOURS OR 5 POUNDS IN 1 WEEK 30 tablet 1  . glucose blood test strip Use as instructed. E11.5. To check blood glucose daily. 100 each 12  . HYDROcodone-acetaminophen (NORCO/VICODIN) 5-325 MG tablet Take 1 tablet by mouth every 6 (six) hours as needed for severe pain. 10 tablet 0  .  loratadine (CLARITIN) 10 MG tablet Take 10 mg by mouth daily as needed for allergies.     [name] losartan (COZAAR) 25 MG tablet TAKE 1 TABLET BY MOUTH EVERY DAY 90 tablet 1  . magnesium oxide (MAG-OX) 400 MG tablet TAKE 1 TABLET BY MOUTH EVERY DAY 30 tablet 1  . metFORMIN (GLUCOPHAGE-XR) 500 MG 24 hr tablet Take 500 mg by mouth daily with breakfast.     . metoprolol succinate (TOPROL-XL) 25 MG 24 hr tablet TAKE 1 TABLET (25 MG TOTAL) BY MOUTH DAILY. 30 tablet 11  . nitroGLYCERIN (NITROSTAT) 0.4 MG SL tablet Place 1 tablet (0.4 mg total) under the tongue every 5 (five) minutes as needed for chest pain. max of 3 if no relief call 911 25 tablet PRN  . pantoprazole (PROTONIX) 40 MG tablet Take 1 tablet (40 mg total) by mouth daily. 30 tablet 11  . rosuvastatin (CRESTOR) 20 MG tablet Take 1/2 tablet by mouth 3 times per week 30 tablet 2  . SUMAtriptan (IMITREX) 25 MG tablet Take one tablet at the onset of headache. May repeat in 2 hours if headache persists or recurs. 30 tablet 3  . ezetimibe (ZETIA) 10 MG tablet Take 1 tablet (10 mg total) by mouth daily. 30 tablet 5  . spironolactone (ALDACTONE) 25 MG tablet Take 1 tablet (25 mg total) by mouth daily. 30 tablet 5   No current facility-administered medications for this visit.     Allergies:   Dilaudid [hydromorphone hcl]; Hydromorphone; Epinephrine; and  Lactose intolerance (gi)    Social History:  The patient  reports that she has never smoked. She has never used smokeless tobacco. She reports that she does not drink alcohol or use drugs.   Family History:  The patient's family history includes Alzheimer's disease in her father; COPD in her mother; Diabetes in her brother and mother; Heart attack in her father; Hypertension in her mother.    ROS:  Please see the history of present illness.   Otherwise, review of systems are positive for none.   All other systems are reviewed and negative.    PHYSICAL EXAM: VS:  BP 130/78   Pulse 87   Ht 5' 2.5" (1.588 m)   Wt 185 lb (83.9 kg)   BMI 33.30 kg/m  , BMI Body mass index is 33.3 kg/m. GENERAL:  Well appearing HEENT: Pupils equal round and reactive, fundi not visualized, oral mucosa unremarkable NECK:  No jugular venous distention, waveform within normal limits, carotid upstroke brisk and symmetric, no bruits, no thyromegaly LUNGS:  Clear to auscultation bilaterally HEART:  RRR.  PMI not displaced or sustained,S1 and S2 within normal limits, no S3, no S4, no clicks, no rubs, II/VI systolic murmurs ABD:  Flat, positive bowel sounds normal in frequency in pitch, no bruits, no rebound, no guarding, no midline pulsatile mass, no hepatomegaly, no splenomegaly EXT:  2 plus pulses throughout, no edema, no cyanosis no clubbing SKIN:  No rashes no nodules NEURO:  Cranial nerves II through XII grossly intact, motor grossly intact throughout PSYCH:  Cognitively intact, oriented to person place and time   EKG:  EKG is not ordered today. 02/11/16: Sinus arrhythmia.  Rate 71 bpm. Non-specific ST-T changes. 10/05/16: Sinus rhythm. Rate 85 bpm. Prior inferior infarct. 11/16/17: Sinus rhythm.  Rate 87 bpm.  Prior inferior infarct.  07/22/18: Sinu srhythm.  Rate 90 bpm.  Prior inferior infarct.    07/26/18: Study Conclusions  - Left ventricle: The cavity size was normal. Systolic function  was   mildly  reduced. The estimated ejection fraction was in the range   of 45% to 50%. Hypokinesis of the mid-apicalinferior and   inferoseptal myocardium. Doppler parameters are consistent with   abnormal left ventricular relaxation (grade 1 diastolic   dysfunction). Acoustic contrast opacification revealed no   evidence ofthrombus. - Mitral valve: There was moderate regurgitation directed   eccentrically and anteriorly. - Left atrium: The atrium was mildly dilated. - Atrial septum: No defect or patent foramen ovale was identified.  Impressions:  - Direct comparison to images from August 2917 shows no change in   wall motion pattern or overall LV EF. [name]  Lexiscan Myoview 11/24/17:  The left ventricular ejection fraction is moderately decreased (30-44%).  Nuclear stress EF: 36%.  There was no ST segment deviation noted during stress.  There is a large defect of severe severity present in the mid anterior, mid inferior, apical anterior, apical septal, apical inferior, apical lateral and apex location. The defect is partially reversible. This is consistent with infarct with small amount of peri infarct ischemia in the inferior and apical walls.  This is a high risk study.  Compared to prior study the EF has declined and there appears to be peri infarct ischemia in the inferior wall although this could also be variations in diaphragmatic attenuation artifact.   LHC 12/07/17:  Mid LM to Ost LAD DES Stent is 10% stenosed. -This entire segment is heavily calcified. There is a significant step down after the stent at the takeoff of the diagonal branches.  Prox LAD lesion is 20% stenosed.  Ost Ramus lesion is 40% stenosed. Proc Circumflex ~40%.  There is moderate left ventricular systolic dysfunction. The left ventricular ejection fraction is 35-45% by visual estimate.  LV end diastolic pressure is moderately elevated. 22-24 mmHg  Recent Labs: 12/03/2017: Hemoglobin 12.1; Platelets  258 04/05/2018: ALT 11 08/12/2018: BUN 9; Creatinine, Ser 0.72; Potassium 4.6; Sodium 140    Lipid Panel    Component Value Date/Time   CHOL 161 04/05/2018 0858   TRIG 63 04/05/2018 0858   HDL 50 04/05/2018 0858   CHOLHDL 3.2 04/05/2018 0858   CHOLHDL 3.1 10/05/2016 0937   VLDL 15 10/05/2016 0937   LDLCALC 98 04/05/2018 0858      Wt Readings from Last 3 Encounters:  11/09/18 185 lb (83.9 kg)  10/03/18 190 lb 12.8 oz (86.5 kg)  08/12/18 193 lb 12.8 oz (87.9 kg)      ASSESSMENT AND PLAN:  # CAD s/p STEMI: # Chest pain: [name] had an ST elevation myocardial infarction with stents placed in the LAD/left main and left circumflex arteries.  She is on low dose ticagrelor indefinitely.  LHC 11/2017 showed patent stents with 20-40% lesions.  She is having more chest pain lately but it is atypical.  She had a Lexiscan Myoview that was a false positive.  Given her baseline wall motion abnormalities a stress echo would not be helpful.  We will get an ETT.  I have asked her to hold her metoprolol prior to the study.  Continue aspirin, ticagrelor, Bidil, rosuvastatin, and metoprolol.    # Acute on chronic systolic and diastolic heart failure: LVEF 45-50%.  HF symptoms are better.  Continue losartan, Bidil, carvedilol, and spironolactone.    # Hypertensive heart disease: BP well-controlled on Bidil, losartan, spironolactone, and metoprolol.  She reports lightheadedness upon getting up in the mornings.  We checked orthostatic vitals and they were negative.  # Hyperlipidemia:  Rosuvastatin was reduced due to muscle cramping. She was started on Repatha.   # OSA: Oral airway device.   # Cramping: # Hypokalemia: Check BMP  Current medicines are reviewed at length with the patient today.  The patient does not have concerns regarding medicines.  The following changes have been made: none  Labs/ tests ordered today include:   Orders Placed This Encounter  Procedures  . Basic  metabolic panel  . Exercise Tolerance Test     Disposition:   FU with Zoeya Gramajo C. Oval Linsey, MD, [facility] in 3 months.      Signed, Danarius Mcconathy C. Oval Linsey, MD, [facility]  11/09/2018 9:17 AM    [place]

## 2018-11-09 NOTE — Patient Instructions (Addendum)
Medication Instructions:  Your physician recommends that you continue on your current medications as directed. Please refer to the Current Medication list given to you today.  If you need a refill on your cardiac medications before your next appointment, please call your pharmacy.   Lab work: BMET TODAY  If you have labs (blood work) drawn today and your tests are completely normal, you will receive your results only by: [name] MyChart Message (if you have MyChart) OR . A paper copy in the mail If you have any lab test that is abnormal or we need to change your treatment, we will call you to review the results.  Testing/Procedures: Your physician has requested that you have an exercise tolerance test. For further information please visit [email or web address]. Please also follow instruction sheet, as given.  NO METOPROLOL MORNING OF TREADMILL   Follow-Up: At [facility], you and your health needs are our priority.  As part of our continuing mission to provide you with exceptional heart care, we have created designated Provider Care Teams.  These Care Teams include your primary Cardiologist (physician) and Advanced Practice Providers (APPs -  Physician Assistants and Nurse Practitioners) who all work together to provide you with the care you need, when you need it. You will need a follow up appointment in 3 months  You may see Skeet Latch, MD or one of the following Advanced Practice Providers on your designated Care Team:   Kerin Ransom, PA-C Roby Lofts, [place] . Sande Rives, PA-C  Any Other Special Instructions Will Be Listed Below (If Applicable).  Exercise Stress Electrocardiogram An exercise stress electrocardiogram is a test to check how blood flows to your heart. It is done to find areas of poor blood flow. You will need to walk on a treadmill for this test. The electrocardiogram will record your heartbeat when you are at rest and when you are exercising. What happens before  the procedure?  Do not have drinks with caffeine or foods with caffeine for 24 hours before the test, or as told by your doctor. This includes coffee, tea (even decaf tea), sodas, chocolate, and cocoa.  Follow your doctor's instructions about eating and drinking before the test.  Ask your doctor what medicines you should or should not take before the test. Take your medicines with water unless told by your doctor not to.  If you use an inhaler, bring it with you to the test.  Bring a snack to eat after the test.  Do not  smoke for 4 hours before the test.  Do not put lotions, powders, creams, or oils on your chest before the test.  Wear comfortable shoes and clothing. What happens during the procedure?  You will have patches put on your chest. Small areas of your chest may need to be shaved. Wires will be connected to the patches.  Your heart rate will be watched while you are resting and while you are exercising.  You will walk on the treadmill. The treadmill will slowly get faster to raise your heart rate.  The test will take about 1-2 hours. What happens after the procedure?  Your heart rate and blood pressure will be watched after the test.  You may return to your normal diet, activities, and medicines or as told by your doctor. This information is not intended to replace advice given to you by your health care provider. Make sure you discuss any questions you have with your health care provider. Document Released: 05/04/2008 Document Revised: 07/15/2016  Document Reviewed: 07/24/2013 Elsevier Interactive Patient Education  [employer].

## 2018-11-15 ENCOUNTER — Telehealth (HOSPITAL_COMMUNITY): Payer: Self-pay

## 2018-11-15 NOTE — Telephone Encounter (Signed)
Encounter complete. 

## 2018-11-15 NOTE — Progress Notes (Signed)
DM w/o DR  The patient was advised to maintain tight glucose control, tight blood pressure control, and favorable levels of cholesterol. Trace nuclear sclerosis cataract OU, not visually significant. RTC 1 year OCT on arrival.

## 2018-11-17 ENCOUNTER — Ambulatory Visit (HOSPITAL_COMMUNITY)
Admission: RE | Admit: 2018-11-17 | Discharge: 2018-11-17 | Disposition: A | Discharge status: Home / Self Care | Payer: BC Managed Care – PPO | Source: Ambulatory Visit | Attending: Cardiology | Admitting: Cardiology

## 2018-11-17 DIAGNOSIS — R079 Chest pain, unspecified: Secondary | ICD-10-CM

## 2018-11-17 LAB — EXERCISE TOLERANCE TEST
Estimated workload: 6.2 METS
Exercise duration (min): 4 min
Exercise duration (sec): 21 s
MPHR: 160 {beats}/min
Peak HR: 146 {beats}/min
Percent HR: 91 %
RPE: 18
Rest HR: 82 {beats}/min

## 2018-11-21 ENCOUNTER — Encounter: Payer: Self-pay | Admitting: Family Medicine

## 2018-11-21 ENCOUNTER — Ambulatory Visit (INDEPENDENT_AMBULATORY_CARE_PROVIDER_SITE_OTHER): Payer: BC Managed Care – PPO | Admitting: Family Medicine

## 2018-11-21 ENCOUNTER — Other Ambulatory Visit: Payer: Self-pay

## 2018-11-21 VITALS — BP 137/80 | HR 87 | Temp 99.1°F | Resp 18 | Ht 61.42 in | Wt 196.6 lb

## 2018-11-21 DIAGNOSIS — R059 Cough, unspecified: Secondary | ICD-10-CM

## 2018-11-21 DIAGNOSIS — R05 Cough: Secondary | ICD-10-CM | POA: Diagnosis not present

## 2018-11-21 DIAGNOSIS — J029 Acute pharyngitis, unspecified: Secondary | ICD-10-CM

## 2018-11-21 DIAGNOSIS — J209 Acute bronchitis, unspecified: Secondary | ICD-10-CM | POA: Diagnosis not present

## 2018-11-21 MED ORDER — AMOXICILLIN-POT CLAVULANATE 875-125 MG PO TABS: 1.0000 | ORAL_TABLET | Freq: Two times a day (BID) | ORAL | 0 refills | Status: DC

## 2018-11-21 MED ORDER — ALBUTEROL SULFATE HFA 108 (90 BASE) MCG/ACT IN AERS: 2.0000 | INHALATION_SPRAY | RESPIRATORY_TRACT | 1 refills | Status: DC | PRN

## 2018-11-21 NOTE — Patient Instructions (Addendum)
  Drink plenty of fluids and get enough rest.  If you stay well-hydrated helps to keep the secretions thinner  Use an over-the-counter cough syrup such as Robitussin-DM or Mucinex DM which helps suppress the cough and thin the mucus.  Take Tylenol or ibuprofen if needed for fever or aching.  Take Augmentin 875 mg 1 twice daily for infection (amoxicillin/clavulanate)  Use the albuterol inhaler 2 inhalations every 4-6 hours as needed for wheezing and coughing.  Return if not improving or getting worse.   If you have lab work done today you will be contacted with your lab results within the next 2 weeks.  If you have not heard from [place] then please contact us. The fastest way to get your results is to register for My Chart.   IF you received an x-ray today, you will receive an invoice from [facility]. Please contact [facility] at [phone] with questions or concerns regarding your invoice.   IF you received labwork today, you will receive an invoice from [place]. Please contact [facility] at [phone] with questions or concerns regarding your invoice.   Our billing staff will not be able to assist you with questions regarding bills from these companies.  You will be contacted with the lab results as soon as they are available. The fastest way to get your results is to activate your My Chart account. Instructions are located on the last page of this paperwork. If you have not heard from [place] regarding the results in 2 weeks, please contact this office.        If you have lab work done today you will be contacted with your lab results within the next 2 weeks.  If you have not heard from [place] then please contact us. The fastest way to get your results is to register for My Chart.   IF you received an x-ray today, you will receive an invoice from [facility]. Please contact [facility] at [phone] with questions or concerns regarding your invoice.    IF you received labwork today, you will receive an invoice from [place]. Please contact [facility] at [phone] with questions or concerns regarding your invoice.   Our billing staff will not be able to assist you with questions regarding bills from these companies.  You will be contacted with the lab results as soon as they are available. The fastest way to get your results is to activate your My Chart account. Instructions are located on the last page of this paperwork. If you have not heard from [place] regarding the results in 2 weeks, please contact this office.

## 2018-11-21 NOTE — Progress Notes (Signed)
Patient ID: Bergen Melle, female    DOB: [birth year]  Age: 62 y.o. MRN: [id number]  Chief Complaint  Patient presents with  . Cough    X 1 week- pt states with fever   . Sore Throat    X 1 day- pt states with SOB    Subjective:   62 year old lady who comes in here with a history of having had a respiratory tract infection over the past week.  She is not running a fever significantly, though her temperature is minimally elevated today.  She does not smoke.  She did have her flu shot.  She does not get an excessive number of respiratory tract infections.  She developed a cough and sore throat.  The throat got better and now bothering her again.  She says the chest seems to have gotten more congested.  She coughs up gray phlegm.  She gets wheezier later in the day.  She has used an albuterol inhaler in the past with some relief but does not have one now.  She is diabetic but her sugar was 9 9 this morning.  Current allergies, medications, problem list, past/family and social histories reviewed.  Objective:  BP 137/80   Pulse 87   Temp 99.1 F (37.3 C) (Oral)   Resp 18   Ht 5' 1.42" (1.56 m)   Wt 196 lb 9.6 oz (89.2 kg)   SpO2 98%   BMI 36.64 kg/m   No major distress.  TMs normal.  Throat mildly erythematous without exudate.  Neck supple without significant nodes.  Chest is fairly clear to auscultation.  Heart regular without murmurs.  No wheezes at this time but she says they get more later in the day.  Assessment & Plan:   Assessment: 1. Acute bronchitis, unspecified organism   2. Cough   3. Sore throat       Plan: See instructions  No orders of the defined types were placed in this encounter.   Meds ordered this encounter  Medications  . amoxicillin-clavulanate (AUGMENTIN) 875-125 MG tablet    Sig: Take 1 tablet by mouth 2 (two) times daily.    Dispense:  20 tablet    Refill:  0  . albuterol (PROVENTIL HFA;VENTOLIN HFA) 108 (90 Base) MCG/ACT inhaler    Sig: Inhale  2 puffs into the lungs every 4 (four) hours as needed for wheezing or shortness of breath (cough, shortness of breath or wheezing.).    Dispense:  1 Inhaler    Refill:  1         Patient Instructions    Drink plenty of fluids and get enough rest.  If you stay well-hydrated helps to keep the secretions thinner  Use an over-the-counter cough syrup such as Robitussin-DM or Mucinex DM which helps suppress the cough and thin the mucus.  Take Tylenol or ibuprofen if needed for fever or aching.  Take Augmentin 875 mg 1 twice daily for infection (amoxicillin/clavulanate)  Use the albuterol inhaler 2 inhalations every 4-6 hours as needed for wheezing and coughing.  Return if not improving or getting worse.   If you have lab work done today you will be contacted with your lab results within the next 2 weeks.  If you have not heard from [place] then please contact us. The fastest way to get your results is to register for My Chart.   IF you received an x-ray today, you will receive an invoice from [facility] Radiology. Please contact [facility] Radiology at [phone] with  questions or concerns regarding your invoice.   IF you received labwork today, you will receive an invoice from [place]. Please contact [facility] at [phone] with questions or concerns regarding your invoice.   Our billing staff will not be able to assist you with questions regarding bills from these companies.  You will be contacted with the lab results as soon as they are available. The fastest way to get your results is to activate your My Chart account. Instructions are located on the last page of this paperwork. If you have not heard from [place] regarding the results in 2 weeks, please contact this office.        If you have lab work done today you will be contacted with your lab results within the next 2 weeks.  If you have not heard from [place] then please contact us. The fastest way to get your results is to  register for My Chart.   IF you received an x-ray today, you will receive an invoice from [facility]. Please contact [facility] Radiology at [phone] with questions or concerns regarding your invoice.   IF you received labwork today, you will receive an invoice from [place]. Please contact [facility] at [phone] with questions or concerns regarding your invoice.   Our billing staff will not be able to assist you with questions regarding bills from these companies.  You will be contacted with the lab results as soon as they are available. The fastest way to get your results is to activate your My Chart account. Instructions are located on the last page of this paperwork. If you have not heard from [place] regarding the results in 2 weeks, please contact this office.        Return if symptoms worsen or fail to improve.   Ruben Reason, MD 11/21/2018

## 2018-12-10 ENCOUNTER — Other Ambulatory Visit: Payer: Self-pay | Admitting: Family Medicine

## 2018-12-12 NOTE — Telephone Encounter (Signed)
Refill request for magnesium oxide 400 mg tabl #30 with 1 refill approved. Dgaddy, CMA

## 2018-12-12 NOTE — Telephone Encounter (Signed)
Requested medication (s) are due for refill today: yes  Requested medication (s) are on the active medication list: yes  Last refill:  06/15/18 for 30 tabs and 1 refill  Future visit scheduled: yes  Notes to clinic:  Magnesium supplementation failed.  Requested Prescriptions  Pending Prescriptions Disp Refills   magnesium oxide (MAG-OX) 400 MG tablet [Pharmacy Med Name: MAGNESIUM OXIDE 400 MG TABLET] 30 tablet 1    Sig: TAKE 1 TABLET BY MOUTH EVERY DAY     Endocrinology:  [facility] - Magnesium Supplementation Failed - 12/10/2018 11:06 AM      Failed - Mg Level in normal range and within 360 days    No results found for: MG       Passed - Valid encounter within last 12 months    Recent Outpatient Visits          3 weeks ago Acute bronchitis, unspecified organism   Primary Care at [facility], Fenton Malling, MD   2 months ago Type 2 diabetes mellitus with other specified complication, unspecified whether long term insulin use ([place])   Primary Care at [facility], Arlie Solomons, MD   7 months ago Encounter for health maintenance examination in adult   Primary Care at [facility], [place] A, MD   11 months ago Type 2 diabetes mellitus without complication, without long-term current use of insulin ([place])   Primary Care at [facility], Zoe A, MD   1 year ago Migraine without aura and without status migrainosus, not intractable   Primary Care at [place], MD      Future Appointments            In 3 months Forrest Moron, MD Primary Care at [place], [facility]

## 2018-12-14 ENCOUNTER — Telehealth: Payer: Self-pay

## 2018-12-14 DIAGNOSIS — E785 Hyperlipidemia, unspecified: Secondary | ICD-10-CM

## 2018-12-14 NOTE — Telephone Encounter (Signed)
Pt returned call and will be compliant to get labs done for lipid

## 2018-12-14 NOTE — Telephone Encounter (Signed)
Called pt to see if still taking repatha and to tell pt they are way overdue for fasting labs and that they were ordered and to come on a first come first serve basis

## 2018-12-20 LAB — LIPID PANEL
Chol/HDL Ratio: 1.8 ratio (ref 0.0–4.4)
Cholesterol, Total: 101 mg/dL (ref 100–199)
HDL: 56 mg/dL (ref >39)
LDL Calculated: 36 mg/dL (ref 0–99)
TRIGLYCERIDES: 43 mg/dL (ref 0–149)
VLDL Cholesterol Cal: 9 mg/dL (ref 5–40)

## 2018-12-23 ENCOUNTER — Other Ambulatory Visit: Payer: Self-pay | Admitting: Pharmacist Clinician (PhC)/ Clinical Pharmacy Specialist

## 2018-12-23 MED ORDER — EVOLOCUMAB 140 MG/ML ~~LOC~~ SOAJ: 140.0000 mg | SUBCUTANEOUS | 11 refills | Status: DC

## 2019-01-05 ENCOUNTER — Telehealth: Payer: Self-pay | Admitting: Cardiovascular Disease

## 2019-01-05 NOTE — Telephone Encounter (Signed)
Spoke with pharmacy, waiting for insurance company to respond

## 2019-01-05 NOTE — Telephone Encounter (Signed)
New Message    Jenny Reichmann from [facility] pharmacy calling about prior auth

## 2019-01-09 NOTE — Telephone Encounter (Signed)
° °  Please return call to [facility]

## 2019-01-12 ENCOUNTER — Other Ambulatory Visit: Payer: Self-pay | Admitting: Pharmacist

## 2019-01-12 MED ORDER — EVOLOCUMAB 140 MG/ML ~~LOC~~ SOAJ: 140.0000 mg | SUBCUTANEOUS | 11 refills | Status: DC

## 2019-01-30 ENCOUNTER — Telehealth: Payer: Self-pay | Admitting: Cardiovascular Disease

## 2019-01-30 NOTE — Telephone Encounter (Signed)
PA for Repatha, will forward to Pharm D

## 2019-01-30 NOTE — Telephone Encounter (Signed)
See message below regarding PA call back.  I know you have been working on this. Thanks!

## 2019-01-30 NOTE — Telephone Encounter (Signed)
New Message  Marie Tanner is calling from cover my meds to follow up on a prior authorization for a medication. Please call back

## 2019-01-31 NOTE — Telephone Encounter (Signed)
Called pt back to see what they called in for regarding their repatha but the pt stated that they didn't call us

## 2019-02-01 ENCOUNTER — Telehealth: Payer: Self-pay | Admitting: Cardiovascular Disease

## 2019-02-01 NOTE — Telephone Encounter (Signed)
New Message        Jes with "Cover my Meds" is needing a prior auth for the patient's "Repatha" [phone]. Ref# [id number].Pls call and advise

## 2019-02-08 ENCOUNTER — Ambulatory Visit: Payer: BC Managed Care – PPO | Admitting: Cardiovascular Disease

## 2019-02-08 ENCOUNTER — Encounter: Payer: Self-pay | Admitting: Cardiovascular Disease

## 2019-02-08 ENCOUNTER — Other Ambulatory Visit: Payer: Self-pay

## 2019-02-08 VITALS — BP 112/62 | HR 80 | Ht 62.0 in | Wt 196.0 lb

## 2019-02-08 DIAGNOSIS — R079 Chest pain, unspecified: Secondary | ICD-10-CM

## 2019-02-08 DIAGNOSIS — K219 Gastro-esophageal reflux disease without esophagitis: Secondary | ICD-10-CM

## 2019-02-08 DIAGNOSIS — E7800 Pure hypercholesterolemia, unspecified: Secondary | ICD-10-CM

## 2019-02-08 DIAGNOSIS — I2510 Atherosclerotic heart disease of native coronary artery without angina pectoris: Secondary | ICD-10-CM

## 2019-02-08 DIAGNOSIS — I5042 Chronic combined systolic (congestive) and diastolic (congestive) heart failure: Secondary | ICD-10-CM | POA: Diagnosis not present

## 2019-02-08 DIAGNOSIS — I10 Essential (primary) hypertension: Secondary | ICD-10-CM | POA: Diagnosis not present

## 2019-02-08 NOTE — Patient Instructions (Addendum)
Medication Instructions:  DECREASE YOUR LOSARTAN TO 12.5 MG DAILY   If you need a refill on your cardiac medications before your next appointment, please call your pharmacy.   Lab work: NONE  Testing/Procedures: NONE  Follow-Up: At [facility], you and your health needs are our priority.  As part of our continuing mission to provide you with exceptional heart care, we have created designated Provider Care Teams.  These Care Teams include your primary Cardiologist (physician) and Advanced Practice Providers (APPs -  Physician Assistants and Nurse Practitioners) who all work together to provide you with the care you need, when you need it. You will need a follow up appointment in 7 months.  Please call our office 2 months in advance to schedule this appointment.  You may see Skeet Latch, MD or one of the following Advanced Practice Providers on your designated Care Team:   Kerin Ransom, PA-C Roby Lofts, [place] . Sande Rives, PA-C  Your physician recommends that you schedule a follow-up appointment in: PA/NP  1 MONTH

## 2019-02-08 NOTE — Progress Notes (Signed)
  Cardiology Office Note   Date:  02/08/2019   ID:  Christie Deharo, DOB 01/26/1956, MRN [id number]  PCP:  Stallings, Zoe A, MD  Cardiologist:   Tiffany , MD  Oncologist: Claire Dees, MD Radiation Oncologist: Ellen Jones, MD Surgical Oncologist: Kandace McGuire, MD  Chief Complaint  Patient presents with  . Follow-up    3 months.  . Headache  . Shortness of Breath  . Edema    Feet were swelling.  . Chest Pain    History of Present Illness: Marie Tanner is a 63 y.o. female with CAD s/p STEMI, hypertension, chronic systolic and diastolic heart failure, OSA not on CPAP, diabetes, prior DVT, breast cancer, and mitral regurgitation who presents for follow up.  In 2015 she had a STEMI and cardiac arrest. She was cared for at [facility]. She ultimately received 2 DES (LM/LAD and LCx).  She had a prolonged hospitalization requiring tracheostomy.  Her liver was lacerated during CPR.  She required surgical repair after femoral catheterization.  She also developed a DVT in the hospital.  One week after discharge she developed pneumonia and was admitted at [facility].  She has been seen in the hospital twice for chest pain and ruled out for MI.  She had a negative Myoview 05/2015.  She also had negative CT scans for PE.  Ms. Delellis also underwent surgery for breast cancer on 02/21/16.  She completed her radiation in September 2017.  She was referred for sleep study which was abnormal.  However, she declined using the CPAP device and is in the process of being fitted for an oral airway.  Ms. Taff Jones had an echo 06/2016 that showed LVEF 50-55% with grade 1 diastolic dysfunction.    Ms. Schlack reported an episode of chest pain that occurred while driving.  She was referred for Lexiscan Myoview 11/24/17 that revealed LVEF 36% with anterior, anteroseptal, apical inferior and apical lateral defects.  There was concern for infarct with peri-infarct ischemia in the inferior and apical walls.   She underwent LHC 11/2017 that revealed patent stents that were heavily calcified and there was a step down after the takeoff of the diagonal.  She had 40% RI, 40% proximal LCx and 20% prox LAD.  LVEF was 35-45% and LVEDP was elevated.  BiDil was added to her regimen for afterload reduction and poorly controlled hypertension.  She reported increased dyspnea on exertion and edema.  Lasix was increased for 2 days.  She was also referred for an echocardiogram 06/2018 that revealed LVEF 45 to 50% with hypokinesis of the mid to apical inferior and inferoseptal myocardium that was relatively unchanged from prior.  She was also started on spironolactone.   At her last appointment Ms. Romanoski had atypical chest pain.  She had an ETT 10/2018 that was negative.  She achieved 6.2 METS on a Bruce protocol.  Since then she continues to have random episodes of chest pain.  The episodes typically occur at rest and occasionally when lying down.  She has not had any with exertion.  She is going to the gym 3 days/week and feels good with exertion.  She has no chest pain, shortness of breath, lightheadedness, or dizziness with exertion.  She only had edema when she missed her diuretic.  She denies orthopnea or PND.  She does report some episodes of lightheadedness when sitting.  She has a history of GERD and has not been taking Protonix regularly.  Past Medical History:  Diagnosis Date  .   Breast cancer ([place])   . Cancer ([place])   . Coronary artery disease   . Depression   . DVT (deep venous thrombosis) ([place]) 11/15   left femerol artery injury during resusciation.  [name] GERD (gastroesophageal reflux disease)   . HCAP (healthcare-associated pneumonia) 11/22/2014  . Heart murmur   . High cholesterol   . History of blood transfusion 09/2014   related to MI  . History of gout   . Hypertension   . Liver laceration 10/19/14  . Pulmonary edema 10/19/14  . Renal disorder   . Sleep apnea    "suppose to wear mask but I don't"  (11/22/2014)  . STEMI (ST elevation myocardial infarction) ([place]) 10/19/14  . Type II diabetes mellitus ([place])     Past Surgical History:  Procedure Laterality Date  . ABDOMINAL HYSTERECTOMY  ~ 2000  . APPLICATION OF WOUND VAC  10/2014   "over my naval" (11/22/2014)  . BREAST LUMPECTOMY WITH AXILLARY LYMPH NODE BIOPSY    . BREAST SURGERY    . [place]; 1982  . CORONARY ANGIOPLASTY WITH STENT PLACEMENT  09/2014   "multiple"/notes 11/22/2014  . EXTRACORPOREAL CIRCULATION    . Femerol artery repair Right 09/2014   pt insists it was a right femoral artery repair on 11/22/2014  . LAPAROSCOPIC ABDOMINAL EXPLORATION    . LEFT HEART CATH AND CORONARY ANGIOGRAPHY N/A 12/07/2017   Procedure: LEFT HEART CATH AND CORONARY ANGIOGRAPHY;  Surgeon: Leonie Man, MD;  Location: [facility];  Service: Cardiovascular;  Laterality: N/A;  . LEFT VENTRICULAR ASSIST DEVICE     pt is not aware of this hx on 11/22/2014  . Liver laceration repair  09/2014   related to CPR/notes 11/22/2014  . TRACHEOSTOMY  09/2014   "closed on it's own when they took it out"  . TUBAL LIGATION  1982     Current Outpatient Medications  Medication Sig Dispense Refill  . acetaminophen (TYLENOL) 500 MG tablet Take 1,000 mg by mouth 2 (two) times daily as needed (pain).     [name] albuterol (PROVENTIL HFA;VENTOLIN HFA) 108 (90 Base) MCG/ACT inhaler Inhale 2 puffs into the lungs every 4 (four) hours as needed for wheezing or shortness of breath (cough, shortness of breath or wheezing.). 1 Inhaler 1  . amoxicillin-clavulanate (AUGMENTIN) 875-125 MG tablet Take 1 tablet by mouth 2 (two) times daily. 20 tablet 0  . aspirin EC 81 MG tablet Take 1 tablet (81 mg total) by mouth daily.    [name] BIDIL 20-37.5 MG tablet TAKE 1 TABLET BY MOUTH TWICE A DAY 60 tablet 5  . blood glucose meter kit and supplies KIT by Does not apply route daily as needed. Dispense based on patient and insurance preference. Use up to four times  daily as directed. (FOR ICD-9 250.00, 250.01).    . BRILINTA 60 MG TABS tablet TAKE 1 TABLET (60 MG TOTAL) BY MOUTH 2 (TWO) TIMES DAILY. 180 tablet 2  . diclofenac sodium (VOLTAREN) 1 % GEL Apply 2 g topically 4 (four) times daily. 1 Tube 3  . Evolocumab (REPATHA SURECLICK) 349 MG/ML SOAJ Inject 140 mg into the skin every 14 (fourteen) days. 2 pen 11  . exemestane (AROMASIN) 25 MG tablet Take 25 mg by mouth daily after breakfast.    . furosemide (LASIX) 40 MG tablet TAKE 1 TABLET AS NEEDED FOR SHORTNESS OF BREATH OR WEIGHT GAIN OF 2 POUNDS IN 24 HOURS OR 5 POUNDS IN 1 WEEK 30 tablet 1  .  glucose blood test strip Use as instructed. E11.5. To check blood glucose daily. 100 each 12  . HYDROcodone-acetaminophen (NORCO/VICODIN) 5-325 MG tablet Take 1 tablet by mouth every 6 (six) hours as needed for severe pain. 10 tablet 0  . loratadine (CLARITIN) 10 MG tablet Take 10 mg by mouth daily as needed for allergies.     [name] losartan (COZAAR) 25 MG tablet Take 25 mg by mouth as directed. TAKE 1/2 TABLET BY MOUTH DAILY    . magnesium oxide (MAG-OX) 400 MG tablet TAKE 1 TABLET BY MOUTH EVERY DAY 30 tablet 1  . metFORMIN (GLUCOPHAGE-XR) 500 MG 24 hr tablet Take 500 mg by mouth daily with breakfast.     . metoprolol succinate (TOPROL-XL) 25 MG 24 hr tablet TAKE 1 TABLET (25 MG TOTAL) BY MOUTH DAILY. 30 tablet 11  . nitroGLYCERIN (NITROSTAT) 0.4 MG SL tablet Place 1 tablet (0.4 mg total) under the tongue every 5 (five) minutes as needed for chest pain. max of 3 if no relief call 911 25 tablet PRN  . pantoprazole (PROTONIX) 40 MG tablet Take 1 tablet (40 mg total) by mouth daily. 30 tablet 11  . rosuvastatin (CRESTOR) 20 MG tablet Take 1/2 tablet by mouth 3 times per week 30 tablet 2  . SUMAtriptan (IMITREX) 25 MG tablet Take one tablet at the onset of headache. May repeat in 2 hours if headache persists or recurs. 30 tablet 3  . ezetimibe (ZETIA) 10 MG tablet Take 1 tablet (10 mg total) by mouth daily. 30 tablet 5    . spironolactone (ALDACTONE) 25 MG tablet Take 1 tablet (25 mg total) by mouth daily. 30 tablet 5   No current facility-administered medications for this visit.     Allergies:   Dilaudid [hydromorphone hcl]; Hydromorphone; Epinephrine; and Lactose intolerance (gi)    Social History:  The patient  reports that she has never smoked. She has never used smokeless tobacco. She reports current alcohol use. She reports that she does not use drugs.   Family History:  The patient's family history includes Alzheimer's disease in her father; COPD in her mother; Diabetes in her brother and mother; Heart attack in her father; Hypertension in her mother.    ROS:  Please see the history of present illness.   Otherwise, review of systems are positive for none.   All other systems are reviewed and negative.    PHYSICAL EXAM: VS:  BP 112/62 (BP Location: Left Arm, Patient Position: Sitting, Cuff Size: Large)   Pulse 80   Ht 5' 2" (1.575 m)   Wt 196 lb (88.9 kg)   BMI 35.85 kg/m  , BMI Body mass index is 35.85 kg/m. GENERAL:  Well appearing HEENT: Pupils equal round and reactive, fundi not visualized, oral mucosa unremarkable NECK:  No jugular venous distention, waveform within normal limits, carotid upstroke brisk and symmetric, no bruits LUNGS:  Clear to auscultation bilaterally HEART:  RRR.  PMI not displaced or sustained, S1 and S2 within normal limits, no S3, no S4, no clicks, no rubs, II/VI systolic murmur at the LUSB ABD:  Flat, positive bowel sounds normal in frequency in pitch, no bruits, no rebound, no guarding, no midline pulsatile mass, no hepatomegaly, no splenomegaly EXT:  2 plus pulses throughout, no edema, no cyanosis no clubbing SKIN:  No rashes no nodules NEURO:  Cranial nerves II through XII grossly intact, motor grossly intact throughout PSYCH:  Cognitively intact, oriented to person place and time   EKG:  EKG is  ordered today. 02/11/16: Sinus arrhythmia.  Rate 71 bpm.  Non-specific ST-T changes. 10/05/16: Sinus rhythm. Rate 85 bpm. Prior inferior infarct. 11/16/17: Sinus rhythm.  Rate 87 bpm.  Prior inferior infarct.  07/22/18: Sinu srhythm.  Rate 90 bpm.  Prior inferior infarct.   02/08/2019: Sinus rhythm.  Rate 80 bpm.  Prior inferior infarct.  07/26/18: Study Conclusions  - Left ventricle: The cavity size was normal. Systolic function was   mildly reduced. The estimated ejection fraction was in the range   of 45% to 50%. Hypokinesis of the mid-apicalinferior and   inferoseptal myocardium. Doppler parameters are consistent with   abnormal left ventricular relaxation (grade 1 diastolic   dysfunction). Acoustic contrast opacification revealed no   evidence ofthrombus. - Mitral valve: There was moderate regurgitation directed   eccentrically and anteriorly. - Left atrium: The atrium was mildly dilated. - Atrial septum: No defect or patent foramen ovale was identified.  Impressions:  - Direct comparison to images from August 2917 shows no change in   wall motion pattern or overall LV EF. .  Lexiscan Myoview 11/24/17:  The left ventricular ejection fraction is moderately decreased (30-44%).  Nuclear stress EF: 36%.  There was no ST segment deviation noted during stress.  There is a large defect of severe severity present in the mid anterior, mid inferior, apical anterior, apical septal, apical inferior, apical lateral and apex location. The defect is partially reversible. This is consistent with infarct with small amount of peri infarct ischemia in the inferior and apical walls.  This is a high risk study.  Compared to prior study the EF has declined and there appears to be peri infarct ischemia in the inferior wall although this could also be variations in diaphragmatic attenuation artifact.   LHC 12/07/17:  Mid LM to Ost LAD DES Stent is 10% stenosed. -This entire segment is heavily calcified. There is a significant step down after the stent  at the takeoff of the diagonal branches.  Prox LAD lesion is 20% stenosed.  Ost Ramus lesion is 40% stenosed. Proc Circumflex ~40%.  There is moderate left ventricular systolic dysfunction. The left ventricular ejection fraction is 35-45% by visual estimate.  LV end diastolic pressure is moderately elevated. 22-24 mmHg  ETT 10/2018: No ischemia.  6.2 METS on Bruce protocol..   Recent Labs: 04/05/2018: ALT 11 11/09/2018: BUN 14; Creatinine, Ser 0.67; Potassium 4.9; Sodium 139    Lipid Panel    Component Value Date/Time   CHOL 101 12/20/2018 1039   TRIG 43 12/20/2018 1039   HDL 56 12/20/2018 1039   CHOLHDL 1.8 12/20/2018 1039   CHOLHDL 3.1 10/05/2016 0937   VLDL 15 10/05/2016 0937   LDLCALC 36 12/20/2018 1039      Wt Readings from Last 3 Encounters:  02/08/19 196 lb (88.9 kg)  11/21/18 196 lb 9.6 oz (89.2 kg)  11/09/18 185 lb (83.9 kg)      ASSESSMENT AND PLAN:  # CAD s/p STEMI: # Chest pain: Ms. Poorman Jones had an ST elevation myocardial infarction with stents placed in the LAD/left main and left circumflex arteries.  She is on low dose ticagrelor indefinitely.  LHC 11/2017 showed patent stents with 20-40% lesions.  ETT ws negative.  Her chest pain is atypical and may be related to GERD.  She will start back taking pantoprazole 40mg daily and see if this helps.  Continue aspirin, ticagrelor, Repatha, Zetia, metoprolol, and rosuvastatin.  # Chronic systolic and diastolic heart failure: LVEF 45-50%.  She   is euvolemic and doing well.  Continue losartan, Bidil, metoprolol, and spironolactone.    # Hypertensive heart disease: BP well-controlled on Bidil, losartan, spironolactone, and metoprolol.  She continues to have dizziness.  We will try reducing losartan to 12.66m daily.  She will track her BP and bring to follow up.  # Hyperlipidemia:  Rosuvastatin was reduced due to muscle cramping.  LDL is well-controlled on Repatha, Zetia, and rosuvastatin.  # OSA: Oral airway  device.    Current medicines are reviewed at length with the patient today.  The patient does not have concerns regarding medicines.  The following changes have been made: none  Labs/ tests ordered today include:   No orders of the defined types were placed in this encounter.    Disposition:   FU with Masha Orbach C. ROval Linsey MD, [facility]in 7 months.  APP in 1 months.      Signed, Tanga Gloor C. ROval Linsey MSchriever [facility] 02/08/2019 10:24 AM    [place]

## 2019-02-16 ENCOUNTER — Encounter: Payer: Self-pay | Admitting: Family Medicine

## 2019-02-16 ENCOUNTER — Other Ambulatory Visit: Payer: Self-pay | Admitting: Family Medicine

## 2019-02-16 ENCOUNTER — Other Ambulatory Visit: Payer: Self-pay

## 2019-02-16 ENCOUNTER — Ambulatory Visit (INDEPENDENT_AMBULATORY_CARE_PROVIDER_SITE_OTHER): Payer: BC Managed Care – PPO

## 2019-02-16 ENCOUNTER — Ambulatory Visit: Payer: BC Managed Care – PPO | Admitting: Family Medicine

## 2019-02-16 VITALS — BP 120/82 | HR 97 | Temp 98.1°F | Resp 16 | Ht 60.63 in | Wt 195.0 lb

## 2019-02-16 DIAGNOSIS — R05 Cough: Secondary | ICD-10-CM

## 2019-02-16 DIAGNOSIS — R9389 Abnormal findings on diagnostic imaging of other specified body structures: Secondary | ICD-10-CM

## 2019-02-16 DIAGNOSIS — J209 Acute bronchitis, unspecified: Secondary | ICD-10-CM

## 2019-02-16 DIAGNOSIS — R059 Cough, unspecified: Secondary | ICD-10-CM

## 2019-02-16 MED ORDER — DOXYCYCLINE HYCLATE 100 MG PO TBEC: 100.0000 mg | DELAYED_RELEASE_TABLET | Freq: Two times a day (BID) | ORAL | 0 refills | Status: DC

## 2019-02-16 MED ORDER — ALBUTEROL SULFATE HFA 108 (90 BASE) MCG/ACT IN AERS: 2.0000 | INHALATION_SPRAY | RESPIRATORY_TRACT | 1 refills | Status: DC | PRN

## 2019-02-16 MED ORDER — FLUCONAZOLE 150 MG PO TABS: 150.0000 mg | ORAL_TABLET | Freq: Every day | ORAL | 0 refills | Status: DC

## 2019-02-16 MED ORDER — PREDNISONE 10 MG (21) PO TBPK: ORAL_TABLET | ORAL | 0 refills | Status: AC

## 2019-02-16 NOTE — Telephone Encounter (Signed)
Will route to office for clarification of orders; pt seen in office 02/16/2019 by Dr Delia Chimes; notified Hassan Rowan.

## 2019-02-16 NOTE — Patient Instructions (Addendum)
If you have lab work done today you will be contacted with your lab results within the next 2 weeks.  If you have not heard from [place] then please contact us. The fastest way to get your results is to register for My Chart.   IF you received an x-ray today, you will receive an invoice from [facility]. Please contact [facility] at [phone] with questions or concerns regarding your invoice.   IF you received labwork today, you will receive an invoice from [facility]. Please contact [facility] at [phone] with questions or concerns regarding your invoice.   Our billing staff will not be able to assist you with questions regarding bills from these companies.  You will be contacted with the lab results as soon as they are available. The fastest way to get your results is to activate your My Chart account. Instructions are located on the last page of this paperwork. If you have not heard from [place] regarding the results in 2 weeks, please contact this office.      Acute Bronchitis, Adult  Acute bronchitis is sudden (acute) swelling of the air tubes (bronchi) in the lungs. Acute bronchitis causes these tubes to fill with mucus, which can make it hard to breathe. It can also cause coughing or wheezing. In adults, acute bronchitis usually goes away within 2 weeks. A cough caused by bronchitis may last up to 3 weeks. Smoking, allergies, and asthma can make the condition worse. Repeated episodes of bronchitis may cause further lung problems, such as chronic obstructive pulmonary disease (COPD). What are the causes? This condition can be caused by germs and by substances that irritate the lungs, including:  Cold and flu viruses. This condition is most often caused by the same virus that causes a cold.  Bacteria.  Exposure to tobacco smoke, dust, fumes, and air pollution. What increases the risk? This condition is more likely to develop in people who:  Have close contact  with someone with acute bronchitis.  Are exposed to lung irritants, such as tobacco smoke, dust, fumes, and vapors.  Have a weak immune system.  Have a respiratory condition such as asthma. What are the signs or symptoms? Symptoms of this condition include:  A cough.  Coughing up clear, yellow, or green mucus.  Wheezing.  Chest congestion.  Shortness of breath.  A fever.  Body aches.  Chills.  A sore throat. How is this diagnosed? This condition is usually diagnosed with a physical exam. During the exam, your health care provider may order tests, such as chest X-rays, to rule out other conditions. He or she may also:  Test a sample of your mucus for bacterial infection.  Check the level of oxygen in your blood. This is done to check for pneumonia.  Do a chest X-ray or lung function testing to rule out pneumonia and other conditions.  Perform blood tests. Your health care provider will also ask about your symptoms and medical history. How is this treated? Most cases of acute bronchitis clear up over time without treatment. Your health care provider may recommend:  Drinking more fluids. Drinking more makes your mucus thinner, which may make it easier to breathe.  Taking a medicine for a fever or cough.  Taking an antibiotic medicine.  Using an inhaler to help improve shortness of breath and to control a cough.  Using a cool mist vaporizer or humidifier to make it easier to breathe. Follow these instructions at home: Medicines  Take  over-the-counter and prescription medicines only as told by your health care provider.  If you were prescribed an antibiotic, take it as told by your health care provider. Do not stop taking the antibiotic even if you start to feel better. General instructions   Get plenty of rest.  Drink enough fluids to keep your urine pale yellow.  Avoid smoking and secondhand smoke. Exposure to cigarette smoke or irritating chemicals will  make bronchitis worse. If you smoke and you need help quitting, ask your health care provider. Quitting smoking will help your lungs heal faster.  Use an inhaler, cool mist vaporizer, or humidifier as told by your health care provider.  Keep all follow-up visits as told by your health care provider. This is important. How is this prevented? To lower your risk of getting this condition again:  Wash your hands often with soap and water. If soap and water are not available, use hand sanitizer.  Avoid contact with people who have cold symptoms.  Try not to touch your hands to your mouth, nose, or eyes.  Make sure to get the flu shot every year. Contact a health care provider if:  Your symptoms do not improve in 2 weeks of treatment. Get help right away if:  You cough up blood.  You have chest pain.  You have severe shortness of breath.  You become dehydrated.  You faint or keep feeling like you are going to faint.  You keep vomiting.  You have a severe headache.  Your fever or chills gets worse. This information is not intended to replace advice given to you by your health care provider. Make sure you discuss any questions you have with your health care provider. Document Released: 12/24/2004 Document Revised: 06/30/2017 Document Reviewed: 05/06/2016 Elsevier Interactive Patient Education  2019 [employer].

## 2019-02-16 NOTE — Progress Notes (Signed)
Established Patient Office Visit  Subjective:  Patient ID: Marie Tanner, female    DOB: [birth year]  Age: 63 y.o. MRN: [id number]  CC:  Chief Complaint  Patient presents with  . Cough    chronic with some chest tightness and SHOB x 3 weeks   . Nasal Congestion    with drainage x 3 weeks     HPI Marie Tanner presents for   3 weeks history of cough She has productive cough with mucus production Exposure: her grandson who is 4 who had a cold She has been having coughing and some wheezing States that she gets feverish but nothing elevated over a hundred Taking claritin and otc decongestants She has a niece who returned from [place] who was teaching music that has been asymptomatic for coronavirus and thus was not quaranteened She was around her this past weekend but she was already sick for nearly 3 weeks  She has congestion and nasal drainage With some chest tightness and shortness of breath She has been using her albuterol 4-5 times a day She does not have a history of asthma but was feeling short of breath She typically uses albuterol with her cold URI symptoms  Past Medical History:  Diagnosis Date  . Breast cancer ([place])   . Cancer ([place])   . Coronary artery disease   . Depression   . DVT (deep venous thrombosis) ([place]) 11/15   left femerol artery injury during resusciation.  [name] GERD (gastroesophageal reflux disease)   . HCAP (healthcare-associated pneumonia) 11/22/2014  . Heart murmur   . High cholesterol   . History of blood transfusion 09/2014   related to MI  . History of gout   . Hypertension   . Liver laceration 10/19/14  . Pulmonary edema 10/19/14  . Renal disorder   . Sleep apnea    "suppose to wear mask but I don't" (11/22/2014)  . STEMI (ST elevation myocardial infarction) ([place]) 10/19/14  . Type II diabetes mellitus ([place])     Past Surgical History:  Procedure Laterality Date  . ABDOMINAL HYSTERECTOMY  ~ 2000  . APPLICATION OF WOUND VAC  10/2014    "over my naval" (11/22/2014)  . BREAST LUMPECTOMY WITH AXILLARY LYMPH NODE BIOPSY    . BREAST SURGERY    . [place]; 1982  . CORONARY ANGIOPLASTY WITH STENT PLACEMENT  09/2014   "multiple"/notes 11/22/2014  . EXTRACORPOREAL CIRCULATION    . Femerol artery repair Right 09/2014   pt insists it was a right femoral artery repair on 11/22/2014  . LAPAROSCOPIC ABDOMINAL EXPLORATION    . LEFT HEART CATH AND CORONARY ANGIOGRAPHY N/A 12/07/2017   Procedure: LEFT HEART CATH AND CORONARY ANGIOGRAPHY;  Surgeon: Leonie Man, MD;  Location: [facility];  Service: Cardiovascular;  Laterality: N/A;  . LEFT VENTRICULAR ASSIST DEVICE     pt is not aware of this hx on 11/22/2014  . Liver laceration repair  09/2014   related to CPR/notes 11/22/2014  . TRACHEOSTOMY  09/2014   "closed on it's own when they took it out"  . TUBAL LIGATION  1982    Family History  Problem Relation Age of Onset  . Diabetes Mother   . Hypertension Mother   . COPD Mother   . Heart attack Father   . Alzheimer's disease Father   . Diabetes Brother   . Stroke Neg Hx     Social History   Socioeconomic History  . Marital status: Divorced  Spouse name: Not on file  . Number of children: 2  . Years of education: Not on file  . Highest education level: Not on file  Occupational History  . Not on file  Social Needs  . Financial resource strain: Not on file  . Food insecurity:    Worry: Not on file    Inability: Not on file  . Transportation needs:    Medical: Not on file    Non-medical: Not on file  Tobacco Use  . Smoking status: Never Smoker  . Smokeless tobacco: Never Used  Substance and Sexual Activity  . Alcohol use: Yes    Comment: occ  . Drug use: No  . Sexual activity: Not Currently  Lifestyle  . Physical activity:    Days per week: Not on file    Minutes per session: Not on file  . Stress: Not on file  Relationships  . Social connections:    Talks on phone: Not on file     Gets together: Not on file    Attends religious service: Not on file    Active member of club or organization: Not on file    Attends meetings of clubs or organizations: Not on file    Relationship status: Not on file  . Intimate partner violence:    Fear of current or ex partner: Not on file    Emotionally abused: Not on file    Physically abused: Not on file    Forced sexual activity: Not on file  Other Topics Concern  . Not on file  Social History Narrative  . Not on file    Outpatient Medications Prior to Visit  Medication Sig Dispense Refill  . acetaminophen (TYLENOL) 500 MG tablet Take 1,000 mg by mouth 2 (two) times daily as needed (pain).     [name] aspirin EC 81 MG tablet Take 1 tablet (81 mg total) by mouth daily.    [name] BIDIL 20-37.5 MG tablet TAKE 1 TABLET BY MOUTH TWICE A DAY 60 tablet 5  . blood glucose meter kit and supplies KIT by Does not apply route daily as needed. Dispense based on patient and insurance preference. Use up to four times daily as directed. (FOR ICD-9 250.00, 250.01).    . BRILINTA 60 MG TABS tablet TAKE 1 TABLET (60 MG TOTAL) BY MOUTH 2 (TWO) TIMES DAILY. 180 tablet 2  . diclofenac sodium (VOLTAREN) 1 % GEL Apply 2 g topically 4 (four) times daily. 1 Tube 3  . Evolocumab (REPATHA SURECLICK) 588 MG/ML SOAJ Inject 140 mg into the skin every 14 (fourteen) days. 2 pen 11  . exemestane (AROMASIN) 25 MG tablet Take 25 mg by mouth daily after breakfast.    . furosemide (LASIX) 40 MG tablet TAKE 1 TABLET AS NEEDED FOR SHORTNESS OF BREATH OR WEIGHT GAIN OF 2 POUNDS IN 24 HOURS OR 5 POUNDS IN 1 WEEK 30 tablet 1  . glucose blood test strip Use as instructed. E11.5. To check blood glucose daily. 100 each 12  . loratadine (CLARITIN) 10 MG tablet Take 10 mg by mouth daily as needed for allergies.     [name] losartan (COZAAR) 25 MG tablet Take 25 mg by mouth as directed. TAKE 1/2 TABLET BY MOUTH DAILY    . magnesium oxide (MAG-OX) 400 MG tablet TAKE 1 TABLET BY MOUTH EVERY  DAY 30 tablet 1  . metFORMIN (GLUCOPHAGE-XR) 500 MG 24 hr tablet Take 500 mg by mouth daily with breakfast.     .  metoprolol succinate (TOPROL-XL) 25 MG 24 hr tablet TAKE 1 TABLET (25 MG TOTAL) BY MOUTH DAILY. 30 tablet 11  . nitroGLYCERIN (NITROSTAT) 0.4 MG SL tablet Place 1 tablet (0.4 mg total) under the tongue every 5 (five) minutes as needed for chest pain. max of 3 if no relief call 911 25 tablet PRN  . pantoprazole (PROTONIX) 40 MG tablet Take 1 tablet (40 mg total) by mouth daily. 30 tablet 11  . rosuvastatin (CRESTOR) 20 MG tablet Take 1/2 tablet by mouth 3 times per week 30 tablet 2  . SUMAtriptan (IMITREX) 25 MG tablet Take one tablet at the onset of headache. May repeat in 2 hours if headache persists or recurs. 30 tablet 3  . albuterol (PROVENTIL HFA;VENTOLIN HFA) 108 (90 Base) MCG/ACT inhaler Inhale 2 puffs into the lungs every 4 (four) hours as needed for wheezing or shortness of breath (cough, shortness of breath or wheezing.). 1 Inhaler 1  . amoxicillin-clavulanate (AUGMENTIN) 875-125 MG tablet Take 1 tablet by mouth 2 (two) times daily. 20 tablet 0  . HYDROcodone-acetaminophen (NORCO/VICODIN) 5-325 MG tablet Take 1 tablet by mouth every 6 (six) hours as needed for severe pain. 10 tablet 0  . ezetimibe (ZETIA) 10 MG tablet Take 1 tablet (10 mg total) by mouth daily. 30 tablet 5  . spironolactone (ALDACTONE) 25 MG tablet Take 1 tablet (25 mg total) by mouth daily. 30 tablet 5   No facility-administered medications prior to visit.     Allergies  Allergen Reactions  . Dilaudid [Hydromorphone Hcl] Anaphylaxis, Itching and Swelling  . Hydromorphone Anaphylaxis, Itching and Swelling  . Epinephrine Anxiety and Other (See Comments)    Severe anxiety  . Lactose Intolerance (Gi) Diarrhea    ROS Review of Systems See hpi  Review of Systems  Constitutional: Negative for activity change, appetite change, chills and fever.  HENT: Negative for congestion, nosebleeds, trouble  swallowing and voice change.   Respiratory: see hpi Gastrointestinal: +nausea, no vomiting Genitourinary: Negative for difficulty urinating, dysuria, flank pain and hematuria.  Musculoskeletal: Negative for back pain, joint swelling and neck pain.  Neurological: Negative for dizziness, speech difficulty, light-headedness and numbness.  See HPI. All other review of systems negative.     Objective:    Physical Exam  BP 120/82   Pulse 97   Temp 98.1 F (36.7 C) (Oral)   Resp 16   Ht 5' 0.63" (1.54 m)   Wt 195 lb (88.5 kg)   SpO2 96%   BMI 37.30 kg/m  Wt Readings from Last 3 Encounters:  02/16/19 195 lb (88.5 kg)  02/08/19 196 lb (88.9 kg)  11/21/18 196 lb 9.6 oz (89.2 kg)   General: alert, oriented, in NAD Head: normocephalic, atraumatic, no sinus tenderness Eyes: EOM intact, no scleral icterus or conjunctival injection Ears: TM clear bilaterally Nose: mucosa nonerythematous, nonedematous Throat: no pharyngeal exudate or erythema Lymph: no posterior auricular, submental or cervical lymph adenopathy Heart: normal rate, normal sinus rhythm, no murmurs Lungs: wheezing bilaterally  CXR EXAM: CHEST - 2 VIEW  COMPARISON:  07/16/2016 radiograph  FINDINGS: The cardiomediastinal silhouette is unremarkable.  Ill-defined 1 cm RIGHT nodular opacity noted.  Elevated RIGHT hemidiaphragm is unchanged.  There is no evidence of pulmonary edema, pleural effusion, or pneumothorax.  No acute bony abnormalities are identified.  IMPRESSION: Ill-defined 1 cm RIGHT nodular opacity - chest CT with contrast recommended to exclude pulmonary nodule.   Electronically Signed   By: Margarette Canada M.D.   On: 02/16/2019 14:06  Health Maintenance Due  Topic Date Due  . Hepatitis C Screening  29-Sep-1956  . OPHTHALMOLOGY EXAM  08/03/1968  . HIV Screening  08/03/1973    There are no preventive care reminders to display for this patient.  Lab Results  Component Value Date    TSH 2.560 10/28/2017   Lab Results  Component Value Date   WBC 3.1 (L) 12/03/2017   HGB 12.1 12/03/2017   HCT 35.6 12/03/2017   MCV 82 12/03/2017   PLT 258 12/03/2017   Lab Results  Component Value Date   NA 139 11/09/2018   K 4.9 11/09/2018   CO2 23 11/09/2018   GLUCOSE 89 11/09/2018   BUN 14 11/09/2018   CREATININE 0.67 11/09/2018   BILITOT 0.6 04/05/2018   ALKPHOS 83 04/05/2018   AST 17 04/05/2018   ALT 11 04/05/2018   PROT 7.9 04/05/2018   ALBUMIN 4.2 04/05/2018   CALCIUM 9.6 11/09/2018   ANIONGAP 10 04/10/2016   Lab Results  Component Value Date   CHOL 101 12/20/2018   Lab Results  Component Value Date   HDL 56 12/20/2018   Lab Results  Component Value Date   LDLCALC 36 12/20/2018   Lab Results  Component Value Date   TRIG 43 12/20/2018   Lab Results  Component Value Date   CHOLHDL 1.8 12/20/2018   Lab Results  Component Value Date   HGBA1C 6.1 (A) 10/03/2018      Assessment & Plan:   Problem List Items Addressed This Visit    None    Visit Diagnoses    Cough    -  Primary   Relevant Orders   DG Chest 2 View (Completed)   Acute bronchitis, unspecified organism       Relevant Medications   albuterol (PROVENTIL HFA;VENTOLIN HFA) 108 (90 Base) MCG/ACT inhaler     Patient with 3 weeks history of cough that is productive with low grade temps Exposure to a preschool child 3 weeks ago Recent exposure to a niece who returned from [place] who is asymptomatic Niece was not advised to be quarantined Since the symptoms started prior to her exposure to her niece will evaluate for other causes Will treat with broad spectrum doxycycline and prednisone Diflucan sent in prn for yeast vaginitis  Reviewed drug allergies CXR was concerning for right il-defined pulmonary nodule Will treat empirically for bronchitis and advised pt to repeat cxr and if still present in 4 weeks will send to chest CT  Meds ordered this encounter  Medications  . doxycycline  (DORYX) 100 MG EC tablet    Sig: Take 1 tablet (100 mg total) by mouth 2 (two) times daily for 7 days.    Dispense:  14 tablet    Refill:  0  . predniSONE (STERAPRED UNI-PAK 21 TAB) 10 MG (21) TBPK tablet    Sig: Take 6 tablets (60 mg total) by mouth daily for 1 day, THEN 5 tablets (50 mg total) daily for 1 day, THEN 4 tablets (40 mg total) daily for 1 day, THEN 3 tablets (30 mg total) daily for 1 day, THEN 2 tablets (20 mg total) daily for 1 day, THEN 1 tablet (10 mg total) daily for 1 day.    Dispense:  21 tablet    Refill:  0  . albuterol (PROVENTIL HFA;VENTOLIN HFA) 108 (90 Base) MCG/ACT inhaler    Sig: Inhale 2 puffs into the lungs every 4 (four) hours as needed for wheezing or shortness of breath (cough, shortness  of breath or wheezing.).    Dispense:  1 Inhaler    Refill:  1    Follow-up: No follow-ups on file.    Forrest Moron, MD

## 2019-02-20 ENCOUNTER — Other Ambulatory Visit: Payer: Self-pay

## 2019-02-20 ENCOUNTER — Telehealth: Payer: Self-pay | Admitting: Family Medicine

## 2019-02-20 DIAGNOSIS — R05 Cough: Secondary | ICD-10-CM

## 2019-02-20 DIAGNOSIS — R059 Cough, unspecified: Secondary | ICD-10-CM

## 2019-02-20 MED ORDER — DOXYCYCLINE HYCLATE 100 MG PO TABS: 100.0000 mg | ORAL_TABLET | Freq: Two times a day (BID) | ORAL | 0 refills | Status: DC

## 2019-02-20 NOTE — Telephone Encounter (Signed)
Please clarify for the pharmacy: Doxycycline 100mg  twice daily  It should be plain doxycycline Not extended release or enteric coated Notify the patient to eat then take the medication

## 2019-02-20 NOTE — Telephone Encounter (Signed)
Copied from [place] [id number]. Topic: General - Inquiry >> Feb 20, 2019 10:57 AM Lionel December wrote: Reason for CRM: Pt was given  doxycycline (DORYX) 100 MG EC tablet  on her last visit. The dosage was wrong and the pharmacy resent over the prescription to office. Pt is still waiting to start medication. Please contact pharmacy about this medication

## 2019-02-20 NOTE — Telephone Encounter (Signed)
Resent Rx to pharmacy and notified pt that Rx will be ready shortly at the pharmacy.

## 2019-02-21 ENCOUNTER — Telehealth: Payer: Self-pay | Admitting: Family Medicine

## 2019-02-21 NOTE — Telephone Encounter (Signed)
Copied from [place] [id number]. Topic: General - Inquiry >> Feb 20, 2019 10:57 AM Lionel December wrote: Reason for CRM: Pt was given  doxycycline (DORYX) 100 MG EC tablet  on her last visit. The dosage was wrong and the pharmacy resent over the prescription to office. Pt is still waiting to start medication. Please contact pharmacy about this medication

## 2019-02-22 NOTE — Telephone Encounter (Signed)
Please see note below. 

## 2019-03-04 ENCOUNTER — Other Ambulatory Visit: Payer: Self-pay | Admitting: Family Medicine

## 2019-03-04 ENCOUNTER — Other Ambulatory Visit: Payer: Self-pay | Admitting: Cardiovascular Disease

## 2019-03-06 NOTE — Telephone Encounter (Signed)
Brilinta refilled. 

## 2019-03-07 ENCOUNTER — Other Ambulatory Visit: Payer: Self-pay | Admitting: Family Medicine

## 2019-03-07 NOTE — Telephone Encounter (Signed)
Please advise 

## 2019-03-09 ENCOUNTER — Telehealth: Payer: Self-pay

## 2019-03-09 NOTE — Telephone Encounter (Signed)
Virtual Visit Pre-Appointment Phone Call  Steps For Call:  1. Confirm consent - "In the setting of the current Covid19 crisis, you are scheduled for a (phone or video) visit with your provider on (date) at (time).  Just as we do with many in-office visits, in order for you to participate in this visit, we must obtain consent.  If you'd like, I can send this to your mychart (if signed up) or email for you to review.  Otherwise, I can obtain your verbal consent now.  All virtual visits are billed to your insurance company just like a normal visit would be.  By agreeing to a virtual visit, we'd like you to understand that the technology does not allow for your provider to perform an examination, and thus may limit your provider's ability to fully assess your condition.  Finally, though the technology is pretty good, we cannot assure that it will always work on either your or our end, and in the setting of a video visit, we may have to convert it to a phone-only visit.  In either situation, we cannot ensure that we have a secure connection.  Are you willing to proceed?"  2. Give patient instructions for WebEx download to smartphone as below if video visit  3. Advise patient to be prepared with any vital sign or heart rhythm information, their current medicines, and a piece of paper and pen handy for any instructions they may receive the day of their visit  4. Inform patient they will receive a phone call 15 minutes prior to their appointment time (may be from unknown caller ID) so they should be prepared to answer  5. Confirm that appointment type is correct in Epic appointment notes (video vs telephone)    TELEPHONE CALL NOTE  Marie Tanner has been deemed a candidate for a follow-up tele-health visit to limit community exposure during the Covid-19 pandemic. I spoke with the patient via phone to ensure availability of phone/video source, confirm preferred email & phone number, and discuss  instructions and expectations.  I reminded Marie Tanner to be prepared with any vital sign and/or heart rhythm information that could potentially be obtained via home monitoring, at the time of her visit. I reminded Marie Tanner to expect a phone call at the time of her visit if her visit.  Did the patient verbally acknowledge consent to treatment? YES  Jacqulynn Cadet, [place] 03/09/2019 9:01 AM   DOWNLOADING THE [place] TO SMARTPHONE  - If Apple, go to [employer] and type in WebEx in the search bar. [place] [employer], the blue/green circle. The app is free but as with any other app downloads, their phone may require them to verify saved payment information or Apple password. The patient does NOT have to create an account.  - If Android, ask patient to go to [employer] and type in WebEx in the search bar. [place] [employer], the blue/green circle. The app is free but as with any other app downloads, their phone may require them to verify saved payment information or Android password. The patient does NOT have to create an account.   CONSENT FOR TELE-HEALTH VISIT - PLEASE REVIEW  I hereby voluntarily request, consent and authorize [facility] and its employed or contracted physicians, physician assistants, nurse practitioners or other licensed health care professionals (the Practitioner), to provide me with telemedicine health care services (the "Services") as deemed necessary by the treating Practitioner. I acknowledge and consent  to receive the Services by the Practitioner via telemedicine. I understand that the telemedicine visit will involve communicating with the Practitioner through live audiovisual communication technology and the disclosure of certain medical information by electronic transmission. I acknowledge that I have been given the opportunity to request an in-person assessment or other available alternative prior to the telemedicine visit and  am voluntarily participating in the telemedicine visit.  I understand that I have the right to withhold or withdraw my consent to the use of telemedicine in the course of my care at any time, without affecting my right to future care or treatment, and that the Practitioner or I may terminate the telemedicine visit at any time. I understand that I have the right to inspect all information obtained and/or recorded in the course of the telemedicine visit and may receive copies of available information for a reasonable fee.  I understand that some of the potential risks of receiving the Services via telemedicine include:  [name] Delay or interruption in medical evaluation due to technological equipment failure or disruption; . Information transmitted may not be sufficient (e.g. poor resolution of images) to allow for appropriate medical decision making by the Practitioner; and/or  . In rare instances, security protocols could fail, causing a breach of personal health information.  Furthermore, I acknowledge that it is my responsibility to provide information about my medical history, conditions and care that is complete and accurate to the best of my ability. I acknowledge that Practitioner's advice, recommendations, and/or decision may be based on factors not within their control, such as incomplete or inaccurate data provided by me or distortions of diagnostic images or specimens that may result from electronic transmissions. I understand that the practice of medicine is not an exact science and that Practitioner makes no warranties or guarantees regarding treatment outcomes. I acknowledge that I will receive a copy of this consent concurrently upon execution via email to the email address I last provided but may also request a printed copy by calling the office of [facility].    I understand that my insurance will be billed for this visit.   I have read or had this consent read to me. . I understand the  contents of this consent, which adequately explains the benefits and risks of the Services being provided via telemedicine.  . I have been provided ample opportunity to ask questions regarding this consent and the Services and have had my questions answered to my satisfaction. . I give my informed consent for the services to be provided through the use of telemedicine in my medical care  By participating in this telemedicine visit I agree to the above.

## 2019-03-14 ENCOUNTER — Telehealth: Payer: Self-pay | Admitting: Physician Assistant

## 2019-03-14 NOTE — Telephone Encounter (Addendum)
Smartphone/my chart/consented to virtual/ pre reg completed

## 2019-03-15 ENCOUNTER — Encounter: Payer: Self-pay | Admitting: Physician Assistant

## 2019-03-15 ENCOUNTER — Telehealth (INDEPENDENT_AMBULATORY_CARE_PROVIDER_SITE_OTHER): Payer: BC Managed Care – PPO | Admitting: Physician Assistant

## 2019-03-15 VITALS — BP 130/89 | HR 92 | Ht 62.0 in | Wt 192.7 lb

## 2019-03-15 DIAGNOSIS — I10 Essential (primary) hypertension: Secondary | ICD-10-CM

## 2019-03-15 DIAGNOSIS — I110 Hypertensive heart disease with heart failure: Secondary | ICD-10-CM

## 2019-03-15 DIAGNOSIS — R0789 Other chest pain: Secondary | ICD-10-CM | POA: Diagnosis not present

## 2019-03-15 DIAGNOSIS — I5042 Chronic combined systolic (congestive) and diastolic (congestive) heart failure: Secondary | ICD-10-CM

## 2019-03-15 DIAGNOSIS — E119 Type 2 diabetes mellitus without complications: Secondary | ICD-10-CM

## 2019-03-15 DIAGNOSIS — G4733 Obstructive sleep apnea (adult) (pediatric): Secondary | ICD-10-CM

## 2019-03-15 DIAGNOSIS — I2510 Atherosclerotic heart disease of native coronary artery without angina pectoris: Secondary | ICD-10-CM

## 2019-03-15 DIAGNOSIS — I340 Nonrheumatic mitral (valve) insufficiency: Secondary | ICD-10-CM

## 2019-03-15 MED ORDER — SPIRONOLACTONE 25 MG PO TABS: 25.0000 mg | ORAL_TABLET | Freq: Every day | ORAL | 5 refills | Status: DC

## 2019-03-15 MED ORDER — METOPROLOL SUCCINATE ER 25 MG PO TB24: 25.0000 mg | ORAL_TABLET | Freq: Every day | ORAL | 11 refills | Status: DC

## 2019-03-15 NOTE — Progress Notes (Signed)
Virtual Visit via Video Note   This visit type was conducted due to national recommendations for restrictions regarding the COVID-19 Pandemic (e.g. social distancing) in an effort to limit this patient's exposure and mitigate transmission in our community.  Due to her co-morbid illnesses, this patient is at least at moderate risk for complications without adequate follow up.  This format is felt to be most appropriate for this patient at this time.  All issues noted in this document were discussed and addressed.  A limited physical exam was performed with this format.  Please refer to the patient's chart for her consent to telehealth for [facility].   Evaluation Performed:  Follow-up visit  Date:  03/15/2019   ID:  Marie Tanner, DOB 1955/12/22, MRN [id number]  Patient Location: Home Provider Location: Home  PCP:  Forrest Moron, MD  Cardiologist:  Skeet Latch, MD  Electrophysiologist:  None   Chief Complaint:  followup  History of Present Illness:    Marie Tanner is a 63 y.o. female with CAD, HTN, DM 2, chronic systolic and diastolic heart failure, obstructive sleep apnea not on CPAP, prior DVT, breast cancer and mitral regurgitation.  She had a STEMI in 2015 associated with cardiac arrest.  She was cared for at [facility].  She ultimately received DES x2 in LM/LAD and LCx.  She had a prolonged hospitalization requiring tracheostomy.  Her liver was lacerated during CPR.  She required surgical repair after femoral catheterization.  She also developed DVT in the hospital.  1 week after discharge, she developed pneumonia and was admitted at [facility].  She had a negative Myoview in July 2016.  CT scan was negative for PE.  She underwent surgery for breast cancer in March 2017 and completed radiation in September 2017.  Her sleep study was abnormal however she declined using CPAP device.  Echocardiogram in August 2017 showed EF 50 to 55% with grade 1 DD.  Last Myoview obtained  in December 2018 revealed EF of 36% with anterior, anteroseptal, apical inferior and apical lateral defect, Myoview was concerning for infarct with peri-infarct ischemia.  She underwent cardiac catheterization in January 2019 revealing patent stents that were heavily calcified, 40% RI, 40% proximal left circumflex and a 20% proximal LAD.  EF was 35 to 45%, LVEDP was elevated.  BiDil was added to her regimen for afterload reduction and poorly controlled hypertension.  She was also started on spironolactone.  Lasix was increased for 2 days.  She was referred for echocardiogram in August 2019 which revealed EF of 45 to 50% with hypokinesis of the mid to apical inferior and inferoseptal myocardium.  Due to recurrence of chest pain, she underwent ETT in December 2019 which was negative.  She was able to achieve 6.2 METS on Bruce protocol. Her last follow-up with Dr. Oval Linsey was on 02/08/2019, at which time she continued to have intermittent chest discomfort however it does not occur with exertion.  She was started back on pantoprazole.  Losartan was reduced to 12.5 mg daily due to dizziness.   Patient was contacted today through doximity video conference visit.  She reported that her dizziness has resolved despite the fact that she is back on 25 mg daily of losartan.  She says after the losartan was reduced her blood pressure went up so she resumed the previous dose instead.  Since that she has no further dizziness despite going back to the previous dose, I will continue her on the higher dose of the  losartan.  Her chest discomfort is also quite rare as well.  Since last month, she only had one episode of chest discomfort.  Given reassuring ETT in December 2019, no further work-up is recommended at this time.  Otherwise she denies any lower extremity edema, orthopnea or PND.  She can follow-up with Dr. Oval Linsey in 6 months.  The patient does not have symptoms concerning for COVID-19 infection (fever, chills, cough,  or new shortness of breath).    Past Medical History:  Diagnosis Date   Breast cancer ([place])    Cancer ([place])    Coronary artery disease    Depression    DVT (deep venous thrombosis) ([place]) 11/15   left femerol artery injury during resusciation.   GERD (gastroesophageal reflux disease)    HCAP (healthcare-associated pneumonia) 11/22/2014   Heart murmur    High cholesterol    History of blood transfusion 09/2014   related to MI   History of gout    Hypertension    Liver laceration 10/19/14   Pulmonary edema 10/19/14   Renal disorder    Sleep apnea    "suppose to wear mask but I don't" (11/22/2014)   STEMI (ST elevation myocardial infarction) ([place]) 10/19/14   Type II diabetes mellitus ([place])    Past Surgical History:  Procedure Laterality Date   ABDOMINAL HYSTERECTOMY  ~ 3295   APPLICATION OF WOUND VAC  10/2014   "over my naval" (11/22/2014)   BREAST LUMPECTOMY WITH AXILLARY LYMPH NODE BIOPSY     BREAST SURGERY     CESAREAN SECTION  1980; [place]  09/2014   "multiple"/notes 11/22/2014   EXTRACORPOREAL CIRCULATION     Femerol artery repair Right 09/2014   pt insists it was a right femoral artery repair on 11/22/2014   LAPAROSCOPIC ABDOMINAL EXPLORATION     LEFT HEART CATH AND CORONARY ANGIOGRAPHY N/A 12/07/2017   Procedure: LEFT HEART CATH AND CORONARY ANGIOGRAPHY;  Surgeon: Leonie Man, MD;  Location: [place] [facility];  Service: Cardiovascular;  Laterality: N/A;   LEFT VENTRICULAR ASSIST DEVICE     pt is not aware of this hx on 11/22/2014   Liver laceration repair  09/2014   related to CPR/notes 11/22/2014   TRACHEOSTOMY  09/2014   "closed on it's own when they took it out"   TUBAL LIGATION  1982     Current Meds  Medication Sig   acetaminophen (TYLENOL) 500 MG tablet Take 1,000 mg by mouth 2 (two) times daily as needed (pain).    albuterol (PROVENTIL HFA;VENTOLIN HFA) 108 (90 Base)  MCG/ACT inhaler Inhale 2 puffs into the lungs every 4 (four) hours as needed for wheezing or shortness of breath (cough, shortness of breath or wheezing.).   aspirin EC 81 MG tablet Take 1 tablet (81 mg total) by mouth daily.   BIDIL 20-37.5 MG tablet TAKE 1 TABLET BY MOUTH TWICE A DAY   BRILINTA 60 MG TABS tablet TAKE 1 TABLET (60 MG TOTAL) BY MOUTH 2 (TWO) TIMES DAILY.   Evolocumab (REPATHA SURECLICK) 188 MG/ML SOAJ Inject 140 mg into the skin every 14 (fourteen) days.   exemestane (AROMASIN) 25 MG tablet Take 25 mg by mouth daily after breakfast.   ezetimibe (ZETIA) 10 MG tablet Take 1 tablet (10 mg total) by mouth daily.   furosemide (LASIX) 40 MG tablet TAKE 1 TABLET AS NEEDED FOR SHORTNESS OF BREATH OR WEIGHT GAIN OF 2 POUNDS IN 24 HOURS OR 5 POUNDS IN  1 WEEK   loratadine (CLARITIN) 10 MG tablet Take 10 mg by mouth daily as needed for allergies.    losartan (COZAAR) 25 MG tablet Take 25 mg by mouth as directed. TAKE 1 TABLET BY MOUTH DAILY   magnesium oxide (MAG-OX) 400 MG tablet TAKE 1 TABLET BY MOUTH EVERY DAY   metFORMIN (GLUCOPHAGE-XR) 500 MG 24 hr tablet Take 500 mg by mouth daily with breakfast.    metoprolol succinate (TOPROL-XL) 25 MG 24 hr tablet Take 1 tablet (25 mg total) by mouth daily.   rosuvastatin (CRESTOR) 20 MG tablet Take 1/2 tablet by mouth 3 times per week   spironolactone (ALDACTONE) 25 MG tablet Take 1 tablet (25 mg total) by mouth daily.   SUMAtriptan (IMITREX) 25 MG tablet Take one tablet at the onset of headache. May repeat in 2 hours if headache persists or recurs.   [DISCONTINUED] metoprolol succinate (TOPROL-XL) 25 MG 24 hr tablet TAKE 1 TABLET (25 MG TOTAL) BY MOUTH DAILY.   [DISCONTINUED] spironolactone (ALDACTONE) 25 MG tablet Take 1 tablet (25 mg total) by mouth daily.     Allergies:   Dilaudid [hydromorphone hcl]; Hydromorphone; Epinephrine; and Lactose intolerance (gi)   Social History   Tobacco Use   Smoking status: Never Smoker    Smokeless tobacco: Never Used  Substance Use Topics   Alcohol use: Yes    Comment: occ   Drug use: No     Family Hx: The patient's family history includes Alzheimer's disease in her father; COPD in her mother; Diabetes in her brother and mother; Heart attack in her father; Hypertension in her mother. There is no history of Stroke.  ROS:   Please see the history of present illness.     All other systems reviewed and are negative.   Prior CV studies:   The following studies were reviewed today:  Echo 07/26/2018 LV EF: 45% -   50% Study Conclusions  - Left ventricle: The cavity size was normal. Systolic function was   mildly reduced. The estimated ejection fraction was in the range   of 45% to 50%. Hypokinesis of the mid-apicalinferior and   inferoseptal myocardium. Doppler parameters are consistent with   abnormal left ventricular relaxation (grade 1 diastolic   dysfunction). Acoustic contrast opacification revealed no   evidence ofthrombus. - Mitral valve: There was moderate regurgitation directed   eccentrically and anteriorly. - Left atrium: The atrium was mildly dilated. - Atrial septum: No defect or patent foramen ovale was identified.  Impressions:  - Direct comparison to images from August 2917 shows no change in   wall motion pattern or overall LV EF.   Cath 12/07/2017  Mid LM to Ost LAD DES Stent is 10% stenosed. -This entire segment is heavily calcified. There is a significant step down after the stent at the takeoff of the diagonal branches.  Prox LAD lesion is 20% stenosed.  Ost Ramus lesion is 40% stenosed. Proc Circumflex ~40%.  There is moderate left ventricular systolic dysfunction. The left ventricular ejection fraction is 35-45% by visual estimate.  LV end diastolic pressure is moderately elevated. 22-24 mmHg   There does not appear to be an obvious culprit lesion to explain inferior ischemia.  She has a large wraparound LAD and is quite likely  with LAD infarct she had inferoapical hypokinesis.  There is moderate disease in the LAD, but not flow-limiting.  Visually, her EF does appear to be slightly higher than what was read on the nuclear stress test.  Plan:  Discharge home after bed rest Continue medical management.  She could have microvascular ischemia with elevated filling pressures.   ETT 11/17/2018 Study Highlights     Blood pressure demonstrated a normal response to exercise.  There was no ST segment deviation noted during stress.   1. Below average exercise tolerance.  2. No evidence for ischemia by ST segment analysis.      Labs/Other Tests and Data Reviewed:    EKG:  An ECG dated 02/08/2019 was personally reviewed today and demonstrated:  Normal sinus rhythm with poor R wave progression in anterior leads.  Recent Labs: 04/05/2018: ALT 11 11/09/2018: BUN 14; Creatinine, Ser 0.67; Potassium 4.9; Sodium 139   Recent Lipid Panel Lab Results  Component Value Date/Time   CHOL 101 12/20/2018 10:39 AM   TRIG 43 12/20/2018 10:39 AM   HDL 56 12/20/2018 10:39 AM   CHOLHDL 1.8 12/20/2018 10:39 AM   CHOLHDL 3.1 10/05/2016 09:37 AM   LDLCALC 36 12/20/2018 10:39 AM    Wt Readings from Last 3 Encounters:  03/15/19 192 lb 11.2 oz (87.4 kg)  02/16/19 195 lb (88.5 kg)  02/08/19 196 lb (88.9 kg)     Objective:    Vital Signs:  BP 130/89    Pulse 92    Ht 5\' 2"  (1.575 m)    Wt 192 lb 11.2 oz (87.4 kg)    BMI 35.25 kg/m    Well nourished, well developed female in no acute distress.   ASSESSMENT & PLAN:    1. Atypical chest pain: ETT was negative in December 2019 despite intermittent chest pain.  The chest pain does not occur with exertion.  It is quite rare and only occurred once in the past month.  2. CAD: Last cardiac catheterization in January 2019.  Medical therapy was recommended at that time.  3. Hypertension: Blood pressure stable on current therapy  4. DM 2: Managed by primary care  provider  5. Chronic systolic and diastolic heart failure: She denies any recent heart failure symptoms.  She uses Lasix as needed.  6. Obstructive sleep apnea: Refused CPAP  7. Mitral regurgitation: Moderate MR noted on last echocardiogram 07/26/2018  COVID-19 Education: The signs and symptoms of COVID-19 were discussed with the patient and how to seek care for testing (follow up with PCP or arrange E-visit).  The importance of social distancing was discussed today.  Time:   Today, I have spent 9 minutes with the patient with telehealth technology discussing the above problems.     Medication Adjustments/Labs and Tests Ordered: Current medicines are reviewed at length with the patient today.  Concerns regarding medicines are outlined above.   Tests Ordered: No orders of the defined types were placed in this encounter.   Medication Changes: Meds ordered this encounter  Medications   metoprolol succinate (TOPROL-XL) 25 MG 24 hr tablet    Sig: Take 1 tablet (25 mg total) by mouth daily.    Dispense:  30 tablet    Refill:  11   spironolactone (ALDACTONE) 25 MG tablet    Sig: Take 1 tablet (25 mg total) by mouth daily.    Dispense:  30 tablet    Refill:  5    D/C RX FOR POTASSIUM    Disposition:  Follow up in 6 month(s)  Signed, Almyra Deforest, PA  03/15/2019 10:49 AM    [facility]

## 2019-03-15 NOTE — Patient Instructions (Signed)
Medication Instructions:   Your physician recommends that you continue on your current medications as directed. Please refer to the Current Medication list given to you today.  If you need a refill on your cardiac medications before your next appointment, please call your pharmacy.   Lab work:  [employer] ORDERED AT THIS TIME OF APPOINTMENT   If you have labs (blood work) drawn today and your tests are completely normal, you will receive your results only by: [name] MyChart Message (if you have MyChart) OR . A paper copy in the mail If you have any lab test that is abnormal or we need to change your treatment, we will call you to review the results.  Testing/Procedures:  NONE ORDERED AT THIS TIME OF APPOINTMENT   Follow-Up: At [facility], you and your health needs are our priority.  As part of our continuing mission to provide you with exceptional heart care, we have created designated Provider Care Teams.  These Care Teams include your primary Cardiologist (physician) and Advanced Practice Providers (APPs -  Physician Assistants and Nurse Practitioners) who all work together to provide you with the care you need, when you need it. You will need a follow up appointment in 6 months.  Please call our office 2 months in advance to schedule this appointment.  You may see Skeet Latch, MD or one of the following Advanced Practice Providers on your designated Care Team:   Kerin Ransom, PA-C Roby Lofts, [place] . Sande Rives, PA-C  Any Other Special Instructions Will Be Listed Below (If Applicable).

## 2019-03-27 ENCOUNTER — Other Ambulatory Visit: Payer: Self-pay

## 2019-03-27 DIAGNOSIS — E1169 Type 2 diabetes mellitus with other specified complication: Secondary | ICD-10-CM

## 2019-04-03 ENCOUNTER — Encounter: Payer: Self-pay | Admitting: Family Medicine

## 2019-04-03 ENCOUNTER — Other Ambulatory Visit: Payer: Self-pay

## 2019-04-03 ENCOUNTER — Ambulatory Visit (INDEPENDENT_AMBULATORY_CARE_PROVIDER_SITE_OTHER): Payer: BC Managed Care – PPO | Admitting: Family Medicine

## 2019-04-03 VITALS — Ht 62.0 in | Wt 193.6 lb

## 2019-04-03 DIAGNOSIS — E1169 Type 2 diabetes mellitus with other specified complication: Secondary | ICD-10-CM

## 2019-04-03 DIAGNOSIS — K5909 Other constipation: Secondary | ICD-10-CM | POA: Diagnosis not present

## 2019-04-03 DIAGNOSIS — R911 Solitary pulmonary nodule: Secondary | ICD-10-CM | POA: Diagnosis not present

## 2019-04-03 DIAGNOSIS — R1032 Left lower quadrant pain: Secondary | ICD-10-CM

## 2019-04-03 MED ORDER — METFORMIN HCL ER 500 MG PO TB24: 500.0000 mg | ORAL_TABLET | Freq: Every day | ORAL | 1 refills | Status: DC

## 2019-04-03 NOTE — Patient Instructions (Signed)
° ° ° °  If you have lab work done today you will be contacted with your lab results within the next 2 weeks.  If you have not heard from us then please contact us. The fastest way to get your results is to register for My Chart. ° ° °IF you received an x-ray today, you will receive an invoice from [facility]. Please contact  Radiology at [phone] with questions or concerns regarding your invoice.  ° °IF you received labwork today, you will receive an invoice from [facility]. Please contact [facility] at [phone] with questions or concerns regarding your invoice.  ° °Our billing staff will not be able to assist you with questions regarding bills from these companies. ° °You will be contacted with the lab results as soon as they are available. The fastest way to get your results is to activate your My Chart account. Instructions are located on the last page of this paperwork. If you have not heard from us regarding the results in 2 weeks, please contact this office. °  ° ° ° °

## 2019-04-03 NOTE — Progress Notes (Signed)
Telemedicine Encounter- SOAP NOTE Established Patient  This telephone encounter was conducted with the patient's (or proxy's) verbal consent via audio telecommunications: yes/no: Yes Patient was instructed to have this encounter in a suitably private space; and to only have persons present to whom they give permission to participate. In addition, patient identity was confirmed by use of name plus two identifiers (DOB and address).  I discussed the limitations, risks, security and privacy concerns of performing an evaluation and management service by telephone and the availability of in person appointments. I also discussed with the patient that there may be a patient responsible charge related to this service. The patient expressed understanding and agreed to proceed.  I spent a total of TIME; 0 MIN TO 60 MIN: 25 minutes talking with the patient or their proxy.  Chief Complaint  Patient presents with  . Diabetes    6 month f/u.   [name] Medication Refill    zetia  . Constipation    c/o constipation since finishing doxycycline. Per pt tried senokot and it moved her but noticed blood in stoll so she discontinued.  Per pt she has a bm every 2-3 days sometimes its 4 days     Subjective   Marie Tanner is a 63 y.o. established patient. Telephone visit today for  HPI  Constipation Pt reports that she has been having difficulty with BM She can go 3-4 days between BM Her stools have been hard She took sennokot which helped but she noted some blood in her BM She had some pain with her BM in the left side She states that she had hemorrhoids before  Diabetes Mellitus: Patient presents for follow up of diabetes. Symptoms: hyperglycemia. Symptoms have stabilized. Patient denies hypoglycemia , increase appetite, nausea and paresthesia of the feet.  Evaluation to date has been included: hemoglobin A1C.  Home sugars: BGs range between 112 and 122.  Lab Results  Component Value Date   HGBA1C 6.1  (A) 10/03/2018    Essential hypertension Hypertension: Patient here for follow-up of elevated blood pressure. She is not exercising and is adherent to low salt diet.  Blood pressure is well controlled at home. Cardiac symptoms none. Patient denies chest pain, chest pressure/discomfort, exertional chest pressure/discomfort, irregular heart beat and lower extremity edema.  Cardiovascular risk factors: diabetes mellitus and hypertension. Use of agents associated with hypertension: none. History of target organ damage: none.   Patient Active Problem List   Diagnosis Date Noted  . Hx of colonic polyp 01/03/2018  . Angina, class III ([place]) 12/07/2017  . Abnormal nuclear stress test 12/07/2017  . Type II diabetes mellitus ([place])   . Sleep apnea   . Renal disorder   . Hypertension   . History of gout   . Heart murmur   . GERD (gastroesophageal reflux disease)   . Coronary artery disease involving native coronary artery of native heart with angina pectoris ([place])   . Cancer ([place])   . Breast cancer ([place])   . Hyperlipidemia LDL goal <70 11/16/2016  . Malignant neoplasm of female breast ([place]) 01/24/2016  . Abnormal finding on mammography 01/17/2016  . Chest pain 06/06/2015  . Diarrhea 06/06/2015  . Essential hypertension 06/06/2015  . Depression   . Encounter for therapeutic drug monitoring 02/19/2015  . Mitral regurgitation 01/30/2015  . 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[place]) 01/30/2015  . Left ventricular systolic dysfunction 57/32/2025  . Dyspnea 11/24/2014  . Cardiomyopathy ([place]) 11/24/2014  . DVT (deep  venous thrombosis) ([place]) 11/24/2014  . DM type 2 (diabetes mellitus, type 2) ([place]) 11/24/2014  . HCAP (healthcare-associated pneumonia) 11/22/2014  . 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 Controlled type 2 diabetes mellitus without complication ([place]) 82/95/6213  . High sodium levels 10/29/2014  . Elevated WBC count 10/29/2014  .  Acute confusion due to medical condition 10/27/2014  . Insomnia due to medical condition 10/27/2014  . Acute respiratory failure ([place]) 10/24/2014  . 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 Common femoral artery injury 10/21/2014  . Injury of kidney 10/20/2014  . Cardiogenic shock ([place]) 10/20/2014  . Laceration of liver 10/20/2014  . ST elevation myocardial infarction (STEMI) ([place]) 10/20/2014  . STEMI (ST elevation myocardial infarction) ([place]) 10/19/2014  . Pulmonary edema 10/19/2014  . Liver laceration 10/19/2014  . History of blood transfusion 09/30/2014  . Diabetes mellitus ([place]) 04/19/2013    Past Medical History:  Diagnosis Date  . Breast cancer ([place])   . Cancer ([place])   . Coronary artery disease   . Depression   . DVT (deep venous thrombosis) ([place]) 11/15   left femerol artery injury during resusciation.  [name] GERD (gastroesophageal reflux disease)   . HCAP (healthcare-associated pneumonia) 11/22/2014  . Heart murmur   . High cholesterol   . History of blood transfusion 09/2014   related to MI  . History of gout   . Hypertension   . Liver laceration 10/19/14  . Pulmonary edema 10/19/14  . Renal disorder   . Sleep apnea    "suppose to wear mask but I don't" (11/22/2014)  . STEMI (ST elevation myocardial infarction) ([place]) 10/19/14  . Type II diabetes mellitus ([place])     Current Outpatient Medications  Medication Sig Dispense Refill  . acetaminophen (TYLENOL) 500 MG tablet Take 1,000 mg by mouth 2 (two) times daily as needed (pain).     [name] albuterol (PROVENTIL HFA;VENTOLIN HFA) 108 (90 Base) MCG/ACT inhaler Inhale 2 puffs into the lungs every 4 (four) hours as needed for wheezing or shortness of breath (cough, shortness of breath or wheezing.). 1 Inhaler 1  . aspirin EC 81 MG tablet Take 1 tablet (81 mg total) by mouth daily.    [name] BIDIL 20-37.5 MG tablet TAKE 1 TABLET BY MOUTH TWICE A DAY 60 tablet 5  . blood glucose meter kit and supplies KIT by Does not apply route daily  as needed. Dispense based on patient and insurance preference. Use up to four times daily as directed. (FOR ICD-9 250.00, 250.01).    . BRILINTA 60 MG TABS tablet TAKE 1 TABLET (60 MG TOTAL) BY MOUTH 2 (TWO) TIMES DAILY. 180 tablet 2  . Evolocumab (REPATHA SURECLICK) 086 MG/ML SOAJ Inject 140 mg into the skin every 14 (fourteen) days. 2 pen 11  . exemestane (AROMASIN) 25 MG tablet Take 25 mg by mouth daily after breakfast.    . furosemide (LASIX) 40 MG tablet TAKE 1 TABLET AS NEEDED FOR SHORTNESS OF BREATH OR WEIGHT GAIN OF 2 POUNDS IN 24 HOURS OR 5 POUNDS IN 1 WEEK 30 tablet 1  . glucose blood test strip Use as instructed. E11.5. To check blood glucose daily. 100 each 12  . loratadine (CLARITIN) 10 MG tablet Take 10 mg by mouth daily as needed for allergies.     [name] losartan (COZAAR) 25 MG tablet Take 25 mg by mouth as directed. TAKE 1 TABLET BY MOUTH DAILY    . magnesium oxide (MAG-OX) 400 MG tablet TAKE 1 TABLET  BY MOUTH EVERY DAY 30 tablet 1  . metFORMIN (GLUCOPHAGE-XR) 500 MG 24 hr tablet Take 1 tablet (500 mg total) by mouth daily with breakfast. 90 tablet 1  . metoprolol succinate (TOPROL-XL) 25 MG 24 hr tablet Take 1 tablet (25 mg total) by mouth daily. 30 tablet 11  . nitroGLYCERIN (NITROSTAT) 0.4 MG SL tablet Place 1 tablet (0.4 mg total) under the tongue every 5 (five) minutes as needed for chest pain. max of 3 if no relief call 911 25 tablet PRN  . pantoprazole (PROTONIX) 40 MG tablet Take 1 tablet (40 mg total) by mouth daily. 30 tablet 11  . rosuvastatin (CRESTOR) 20 MG tablet Take 1/2 tablet by mouth 3 times per week 30 tablet 2  . spironolactone (ALDACTONE) 25 MG tablet Take 1 tablet (25 mg total) by mouth daily. 30 tablet 5  . SUMAtriptan (IMITREX) 25 MG tablet Take one tablet at the onset of headache. May repeat in 2 hours if headache persists or recurs. 30 tablet 3  . diclofenac sodium (VOLTAREN) 1 % GEL Apply 2 g topically 4 (four) times daily. (Patient not taking: Reported on  04/03/2019) 1 Tube 3  . doxycycline (VIBRA-TABS) 100 MG tablet Take 1 tablet (100 mg total) by mouth 2 (two) times daily. (Patient not taking: Reported on 03/15/2019) 14 tablet 0  . ezetimibe (ZETIA) 10 MG tablet Take 1 tablet (10 mg total) by mouth daily. 30 tablet 5  . fluconazole (DIFLUCAN) 150 MG tablet Take 1 tablet (150 mg total) by mouth daily. Repeat in 3 days. (Patient not taking: Reported on 03/15/2019) 2 tablet 0   No current facility-administered medications for this visit.     Allergies  Allergen Reactions  . Dilaudid [Hydromorphone Hcl] Anaphylaxis, Itching and Swelling  . Hydromorphone Anaphylaxis, Itching and Swelling  . Epinephrine Anxiety and Other (See Comments)    Severe anxiety  . Lactose Intolerance (Gi) Diarrhea    Social History   Socioeconomic History  . Marital status: Divorced    Spouse name: Not on file  . Number of children: 2  . Years of education: Not on file  . Highest education level: Not on file  Occupational History  . Not on file  Social Needs  . Financial resource strain: Not on file  . Food insecurity:    Worry: Not on file    Inability: Not on file  . Transportation needs:    Medical: Not on file    Non-medical: Not on file  Tobacco Use  . Smoking status: Never Smoker  . Smokeless tobacco: Never Used  Substance and Sexual Activity  . Alcohol use: Yes    Comment: occ  . Drug use: No  . Sexual activity: Not Currently  Lifestyle  . Physical activity:    Days per week: Not on file    Minutes per session: Not on file  . Stress: Not on file  Relationships  . Social connections:    Talks on phone: Not on file    Gets together: Not on file    Attends religious service: Not on file    Active member of club or organization: Not on file    Attends meetings of clubs or organizations: Not on file    Relationship status: Not on file  . Intimate partner violence:    Fear of current or ex partner: Not on file    Emotionally abused: Not on  file    Physically abused: Not on file    Forced  sexual activity: Not on file  Other Topics Concern  . Not on file  Social History Narrative  . Not on file    ROS Review of Systems  Constitutional: Negative for activity change, appetite change, chills and fever.  HENT: Negative for congestion, nosebleeds, trouble swallowing and voice change.   Respiratory: Negative for cough, shortness of breath and wheezing.   Gastrointestinal: Negative for diarrhea, nausea and vomiting.  Genitourinary: Negative for difficulty urinating, dysuria, flank pain and hematuria.  Musculoskeletal: Negative for back pain, joint swelling and neck pain.  Neurological: Negative for dizziness, speech difficulty, light-headedness and numbness.  See HPI. All other review of systems negative.   Objective   Vitals as reported by the patient: Today's Vitals   04/03/19 1004  Weight: 193 lb 9.6 oz (87.8 kg)  Height: _0  (1.575 m)    CLINICAL DATA:  Cough for 3 weeks  EXAM: CHEST - 2 VIEW  COMPARISON:  07/16/2016 radiograph  FINDINGS: The cardiomediastinal silhouette is unremarkable.  Ill-defined 1 cm RIGHT nodular opacity noted.  Elevated RIGHT hemidiaphragm is unchanged.  There is no evidence of pulmonary edema, pleural effusion, or pneumothorax.  No acute bony abnormalities are identified.  IMPRESSION: Ill-defined 1 cm RIGHT nodular opacity - chest CT with contrast recommended to exclude pulmonary nodule.   Electronically Signed   By: Margarette Canada M.D.   On: 02/16/2019 14:06    Marie Tanner was seen today for diabetes, medication refill and constipation.  Diagnoses and all orders for this visit:  LLQ abdominal pain -     Ambulatory referral to Gastroenterology  Other constipation -     Ambulatory referral to Gastroenterology  Type 2 diabetes mellitus with other specified complication, unspecified whether long term insulin use ([place])  Solitary pulmonary nodule-  Will follow up  with CT -     CT Chest W Contrast; Future  Other orders -     metFORMIN (GLUCOPHAGE-XR) 500 MG 24 hr tablet; Take 1 tablet (500 mg total) by mouth daily with breakfast.    Constipation and LLQ pain Pt with history of hemorrhoids She is on an asa Continue current meds Increase water intake and ambulation She had colonoscopy 04/10/2010  Diabetes Type 2 Continue current meds Exercise will help to regular sugars   I discussed the assessment and treatment plan with the patient. The patient was provided an opportunity to ask questions and all were answered. The patient agreed with the plan and demonstrated an understanding of the instructions.   The patient was advised to call back or seek an in-person evaluation if the symptoms worsen or if the condition fails to improve as anticipated.  I provided 25 minutes of non-face-to-face time during this encounter.  Forrest Moron, MD  Primary [facility] at [facility]

## 2019-04-04 ENCOUNTER — Other Ambulatory Visit: Payer: Self-pay

## 2019-04-04 NOTE — Addendum Note (Signed)
Addended by: Delia Chimes A on: 04/04/2019 08:27 AM   Modules accepted: Orders

## 2019-04-08 ENCOUNTER — Other Ambulatory Visit: Payer: Self-pay | Admitting: Cardiovascular Disease

## 2019-04-11 ENCOUNTER — Encounter: Payer: Self-pay | Admitting: Gastroenterology

## 2019-04-11 ENCOUNTER — Other Ambulatory Visit: Payer: Self-pay

## 2019-04-11 ENCOUNTER — Ambulatory Visit (INDEPENDENT_AMBULATORY_CARE_PROVIDER_SITE_OTHER): Payer: BC Managed Care – PPO | Admitting: Gastroenterology

## 2019-04-11 DIAGNOSIS — K5909 Other constipation: Secondary | ICD-10-CM | POA: Diagnosis not present

## 2019-04-11 DIAGNOSIS — R194 Change in bowel habit: Secondary | ICD-10-CM | POA: Diagnosis not present

## 2019-04-11 DIAGNOSIS — K625 Hemorrhage of anus and rectum: Secondary | ICD-10-CM | POA: Diagnosis not present

## 2019-04-11 NOTE — Progress Notes (Signed)
This patient contacted our office requesting a physician telemedicine consultation regarding clinical questions and/or test results. Interactive audio and video telecommunications were attempted between this provider and the patient.  However, this technology failed due to the patient having technical difficulties.  We continued and completed the visit with audio only.  If new patient, they were referred by Delia Chimes, MD  Participants on the conference : myself and patient   The patient consented to this consultation and was aware that a charge will be placed through their insurance.  I was in my office and the patient was riding in a car with her neice   Encounter time:  Total time 35 minutes, with 20 minutes spent with patient on Doximity and phone (Extensive chart review on complex cardiac history was required)  _____________________________________________________________________________________________              [facility] Consult Note:  History: Marie Tanner 04/11/2019  Referring physician: Forrest Moron, MD  Reason for consult/chief complaint: chronic constipation  Subjective  HPI: This is a 63 year old woman referred by primary care for recent worsening of constipation.  Marie Tanner tells me she has suffered with constipation for years, but it has been worse for about the last 6 weeks.  She initially thought it was due to some antibiotics, but the problem persisted after that.  She took a dose or 2 of Senokot but stopped because it caused loose stool with urgency.  She did not have any other new medicines or clear triggers for this worsening of constipation.  She went from a BM twice a week to every 5 to 6 days.  She has been straining to have bowel movements and occasional blood on the paper in the toilet bowl with BMs.  Primary care had recommended fiber and Culturelle, neither of which she had yet been able to start.  She has some intermittent  lower abdominal pain, more so on the left side before BMs. She denies nausea, vomiting, recent weight change, dysphagia or odynophagia.   ROS:  Review of Systems  Constitutional: Negative for appetite change and unexpected weight change.  HENT: Negative for mouth sores and voice change.   Eyes: Negative for pain and redness.  Respiratory: Positive for chest tightness. Negative for cough and shortness of breath.   Cardiovascular: Negative for chest pain and palpitations.  Genitourinary: Negative for dysuria and hematuria.  Musculoskeletal: Positive for arthralgias. Negative for myalgias.  Skin: Negative for pallor and rash.  Neurological: Positive for headaches. Negative for weakness.  Hematological: Negative for adenopathy.   She occasionally has chest pain both at rest or with exertion  Past Medical History: Past Medical History:  Diagnosis Date  . Breast cancer ([place])   . Cancer ([place])   . Coronary artery disease   . Depression   . DVT (deep venous thrombosis) ([employer]) 11/15   left femerol artery injury during resusciation.  [name] GERD (gastroesophageal reflux disease)   . HCAP (healthcare-associated pneumonia) 11/22/2014  . Heart murmur   . High cholesterol   . History of blood transfusion 09/2014   related to MI  . History of gout   . Hypertension   . Liver laceration 10/19/14  . Pulmonary edema 10/19/14  . Renal disorder   . Sleep apnea    "suppose to wear mask but I don't" (11/22/2014)  . STEMI (ST elevation myocardial infarction) ([place]) 10/19/14  . Type II diabetes mellitus ([place])    Her most recent cardiology office note from 02/08/2019 outlines  her complex cardiac history.  In short, MI with cardiac arrest in 2015 with prolonged hospitalization and placement of 2 drug-eluting stents.   Chest pain with decreased LV ejection fraction early 2019, multivessel coronary disease, no further stents placed, LVEF was reduced.  With medical therapy and afterload reduction she had  improvement in LVEF to 45 to 50% when next checked in August 2019.  She continued to have episodic chest pain, negative exercise tolerance test December 2019.  Past Surgical History: Past Surgical History:  Procedure Laterality Date  . ABDOMINAL HYSTERECTOMY  ~ 2000  . APPLICATION OF WOUND VAC  10/2014   "over my naval" (11/22/2014)  . BREAST LUMPECTOMY WITH AXILLARY LYMPH NODE BIOPSY    . BREAST SURGERY    . [place]; 1982  . CORONARY ANGIOPLASTY WITH STENT PLACEMENT  09/2014   "multiple"/notes 11/22/2014  . EXTRACORPOREAL CIRCULATION    . Femerol artery repair Right 09/2014   pt insists it was a right femoral artery repair on 11/22/2014  . LAPAROSCOPIC ABDOMINAL EXPLORATION    . LEFT HEART CATH AND CORONARY ANGIOGRAPHY N/A 12/07/2017   Procedure: LEFT HEART CATH AND CORONARY ANGIOGRAPHY;  Surgeon: Leonie Man, MD;  Location: [facility];  Service: Cardiovascular;  Laterality: N/A;  . LEFT VENTRICULAR ASSIST DEVICE     pt is not aware of this hx on 11/22/2014  . Liver laceration repair  09/2014   related to CPR/notes 11/22/2014  . TRACHEOSTOMY  09/2014   "closed on it's own when they took it out"  . TUBAL LIGATION  1982   Last colonoscopy about 10 years ago at [facility] (working [facility])  Family History: Family History  Problem Relation Age of Onset  . Diabetes Mother   . Hypertension Mother   . COPD Mother   . Heart attack Father   . Alzheimer's disease Father   . Diabetes Brother   . Stroke Neg Hx   . Colon cancer Neg Hx   . Stomach cancer Neg Hx   . Pancreatic cancer Neg Hx   . AAA (abdominal aortic aneurysm) Neg Hx     Social History: Social History   Socioeconomic History  . Marital status: Divorced    Spouse name: Not on file  . Number of children: 2  . Years of education: Not on file  . Highest education level: Not on file  Occupational History  . Not on file  Social Needs  . Financial resource strain: Not on file  . Food  insecurity:    Worry: Not on file    Inability: Not on file  . Transportation needs:    Medical: Not on file    Non-medical: Not on file  Tobacco Use  . Smoking status: Never Smoker  . Smokeless tobacco: Never Used  Substance and Sexual Activity  . Alcohol use: Yes    Comment: occ  . Drug use: No  . Sexual activity: Not Currently  Lifestyle  . Physical activity:    Days per week: Not on file    Minutes per session: Not on file  . Stress: Not on file  Relationships  . Social connections:    Talks on phone: Not on file    Gets together: Not on file    Attends religious service: Not on file    Active member of club or organization: Not on file    Attends meetings of clubs or organizations: Not on file    Relationship status: Not  on file  Other Topics Concern  . Not on file  Social History Narrative  . Not on file    Allergies: Allergies  Allergen Reactions  . Dilaudid [Hydromorphone Hcl] Anaphylaxis, Itching and Swelling  . Hydromorphone Anaphylaxis, Itching and Swelling  . Epinephrine Anxiety and Other (See Comments)    Severe anxiety  . Lactose Intolerance (Gi) Diarrhea    Outpatient Meds: Current Outpatient Medications  Medication Sig Dispense Refill  . acetaminophen (TYLENOL) 500 MG tablet Take 1,000 mg by mouth 2 (two) times daily as needed (pain).     [name] albuterol (PROVENTIL HFA;VENTOLIN HFA) 108 (90 Base) MCG/ACT inhaler Inhale 2 puffs into the lungs every 4 (four) hours as needed for wheezing or shortness of breath (cough, shortness of breath or wheezing.). 1 Inhaler 1  . aspirin EC 81 MG tablet Take 1 tablet (81 mg total) by mouth daily.    [name] BIDIL 20-37.5 MG tablet TAKE 1 TABLET BY MOUTH TWICE A DAY 60 tablet 5  . blood glucose meter kit and supplies KIT by Does not apply route daily as needed. Dispense based on patient and insurance preference. Use up to four times daily as directed. (FOR ICD-9 250.00, 250.01).    . BRILINTA 60 MG TABS tablet TAKE 1 TABLET  (60 MG TOTAL) BY MOUTH 2 (TWO) TIMES DAILY. 180 tablet 2  . Evolocumab (REPATHA SURECLICK) 865 MG/ML SOAJ Inject 140 mg into the skin every 14 (fourteen) days. 2 pen 11  . exemestane (AROMASIN) 25 MG tablet Take 25 mg by mouth daily after breakfast.    . ezetimibe (ZETIA) 10 MG tablet TAKE 1 TABLET BY MOUTH EVERY DAY 30 tablet 5  . furosemide (LASIX) 40 MG tablet TAKE 1 TABLET AS NEEDED FOR SHORTNESS OF BREATH OR WEIGHT GAIN OF 2 POUNDS IN 24 HOURS OR 5 POUNDS IN 1 WEEK 30 tablet 1  . glucose blood test strip Use as instructed. E11.5. To check blood glucose daily. 100 each 12  . loratadine (CLARITIN) 10 MG tablet Take 10 mg by mouth daily as needed for allergies.     [name] losartan (COZAAR) 25 MG tablet Take 25 mg by mouth as directed. TAKE 1 TABLET BY MOUTH DAILY    . magnesium oxide (MAG-OX) 400 MG tablet TAKE 1 TABLET BY MOUTH EVERY DAY 30 tablet 1  . metFORMIN (GLUCOPHAGE-XR) 500 MG 24 hr tablet Take 1 tablet (500 mg total) by mouth daily with breakfast. 90 tablet 1  . metoprolol succinate (TOPROL-XL) 25 MG 24 hr tablet Take 1 tablet (25 mg total) by mouth daily. 30 tablet 11  . nitroGLYCERIN (NITROSTAT) 0.4 MG SL tablet Place 1 tablet (0.4 mg total) under the tongue every 5 (five) minutes as needed for chest pain. max of 3 if no relief call 911 25 tablet PRN  . pantoprazole (PROTONIX) 40 MG tablet Take 1 tablet (40 mg total) by mouth daily. 30 tablet 11  . rosuvastatin (CRESTOR) 20 MG tablet Take 1/2 tablet by mouth 3 times per week 30 tablet 2  . spironolactone (ALDACTONE) 25 MG tablet Take 1 tablet (25 mg total) by mouth daily. 30 tablet 5  . SUMAtriptan (IMITREX) 25 MG tablet Take one tablet at the onset of headache. May repeat in 2 hours if headache persists or recurs. 30 tablet 3   No current facility-administered medications for this visit.       ___________________________________________________________________ Objective   Exam:  No exam - virtual visit Labs:  CBC Latest Ref  Rng &  Units 12/03/2017 10/28/2017 04/10/2016  WBC 3.4 - 10.8 x10E3/uL 3.1(L) 3.5 8.6  Hemoglobin 11.1 - 15.9 g/dL 12.1 12.4 11.3(L)  Hematocrit 34.0 - 46.6 % 35.6 37.7 34.9(L)  Platelets 150 - 379 x10E3/uL 258 267 215   CMP Latest Ref Rng & Units 11/09/2018 08/12/2018 04/05/2018  Glucose 65 - 99 mg/dL 89 110(H) 92  BUN 8 - 27 mg/dL _0 Creatinine 0.57 - 1.00 mg/dL 0.67 0.72 0.68  Sodium 134 - 144 mmol/L 139 140 140  Potassium 3.5 - 5.2 mmol/L 4.9 4.6 4.7  Chloride 96 - 106 mmol/L 102 103 104  CO2 20 - 29 mmol/L _1 Calcium 8.7 - 10.3 mg/dL 9.6 9.6 9.5  Total Protein 6.0 - 8.5 g/dL - - 7.9  Total Bilirubin 0.0 - 1.2 mg/dL - - 0.6  Alkaline Phos 39 - 117 IU/L - - 83  AST 0 - 40 IU/L - - 17  ALT 0 - 32 IU/L - - 11   No recent TSH  Radiologic Studies:  Last abdominal imaging was a CT scan in 2017 when the patient had abdominal pain and fever while undergoing breast cancer chemotherapy.  It showed only a nonobstructing umbilical hernia  Assessment: Encounter Diagnoses  Name Primary?  . Change in bowel habits Yes  . Chronic constipation   . Rectal bleeding     Recent worsening of chronic constipation for unclear reasons.  Intermittent lower abdominal pain and bleeding with bowel movements.  Plan:  I recommended daily MiraLAX instead of the fiber in probiotic.  Start 1 capful a day in a glass of water or juice, decreasing to half a capful if stool becomes too loose or frequent.  My office will set her up for an in person clinic visit with me in the near future to reevaluate symptoms and plan colonoscopy.  I will need to communicate with her cardiologist in the meantime about the possibility of her being off Brilinta 5 days before procedure.  I am hopeful that will be acceptable to them since it has been years since her last coronary intervention.  Thank you for the courtesy of this consult.  Please call me with any questions or concerns.  Nelida Meuse III  CC: Referring  provider noted above

## 2019-04-12 ENCOUNTER — Telehealth: Payer: Self-pay

## 2019-04-12 NOTE — Progress Notes (Signed)
Left message to call back and schedule a follow up in person visit tt

## 2019-04-12 NOTE — Telephone Encounter (Signed)
Left a message to return call and schedule a follow up in person visit

## 2019-04-12 NOTE — Telephone Encounter (Signed)
-----   Message from Doran Stabler, MD sent at 04/11/2019 12:38 PM EDT ----- Please arrange an in person follow-up clinic visit with me for chronic constipation.  I would like to see her in the next 2 to 3 weeks.  - HD

## 2019-04-13 ENCOUNTER — Other Ambulatory Visit: Payer: BC Managed Care – PPO

## 2019-04-25 ENCOUNTER — Ambulatory Visit: Payer: BC Managed Care – PPO | Admitting: Family Medicine

## 2019-04-25 ENCOUNTER — Other Ambulatory Visit: Payer: Self-pay

## 2019-04-25 ENCOUNTER — Encounter: Payer: Self-pay | Admitting: Family Medicine

## 2019-04-25 VITALS — BP 126/81 | HR 81 | Temp 98.7°F | Resp 16 | Ht 62.0 in | Wt 197.6 lb

## 2019-04-25 DIAGNOSIS — K5909 Other constipation: Secondary | ICD-10-CM | POA: Diagnosis not present

## 2019-04-25 DIAGNOSIS — E1169 Type 2 diabetes mellitus with other specified complication: Secondary | ICD-10-CM

## 2019-04-25 DIAGNOSIS — Z6836 Body mass index (BMI) 36.0-36.9, adult: Secondary | ICD-10-CM | POA: Diagnosis not present

## 2019-04-25 LAB — CMP14+EGFR
ALT: 23 IU/L (ref 0–32)
AST: 31 IU/L (ref 0–40)
Albumin/Globulin Ratio: 1.3 (ref 1.2–2.2)
Albumin: 4.4 g/dL (ref 3.8–4.9)
Alkaline Phosphatase: 96 IU/L (ref 39–117)
BUN/Creatinine Ratio: 20 (ref 12–28)
BUN: 14 mg/dL (ref 8–27)
Bilirubin Total: 0.5 mg/dL (ref 0.0–1.2)
CO2: 23 mmol/L (ref 20–29)
Calcium: 9.8 mg/dL (ref 8.7–10.3)
Chloride: 103 mmol/L (ref 96–106)
Creatinine, Ser: 0.69 mg/dL (ref 0.57–1.00)
GFR calc Af Amer: 109 mL/min/{1.73_m2} (ref >59)
GFR calc non Af Amer: 95 mL/min/{1.73_m2} (ref >59)
Globulin, Total: 3.4 g/dL (ref 1.5–4.5)
Glucose: 111 mg/dL — ABNORMAL HIGH (ref 65–99)
Potassium: 5 mmol/L (ref 3.5–5.2)
Sodium: 137 mmol/L (ref 134–144)
Total Protein: 7.8 g/dL (ref 6.0–8.5)

## 2019-04-25 LAB — POCT GLYCOSYLATED HEMOGLOBIN (HGB A1C): Hemoglobin A1C: 7 % — AB (ref 4.0–5.6)

## 2019-04-25 NOTE — Patient Instructions (Addendum)
 Low Glycemic Foods (20-49)  Breakfast Cereals: All-Bran                All-Bran Fruit ' n Oats Fiber One               Oatmeal (not instant)  Oat bran Fruits and fruit juices: (Limit to 1-2 servings per day) Apples               Apricots (fresh & dried)  Blackberries            Blueberries Cherries                  Cranberries             Peaches                  Pears                       Plums                       Prunes Grapefruit                Raspberries            Strawberries           Tangerine Apple juice             Grapefruit juice Tomato juice  Beans and legumes (fresh-cooked): Black-eyed peas     Butter beans Chick peas              Lentils     Green beans           Lima beans               Kidney beans          Navy beans  Non-starchy vegetables: Asparagus, bok choy, broccoli, cabbage, cauliflower, celery, cucumber, greens, lettuce, mushrooms, peppers, tomatoes, okra, onions, snow peas, spinach, summer squash  Grains: Barley                                Bulgur Rye                                    Wild rice  Nuts and oils : Almonds         Peanuts     Sunflower seeds  Hazelnuts      Pecans          Walnuts Oils that are liquid at room temperature  Dairy, fish, and meat: Milk, skim                         Lowfat cheese Yogurt, lowfat, fruit sugar sweetened Lean red meat                      Fish  Skinless chicken & turkey    Shellfish Moderate Glycemic Foods (50-69)  Breakfast Cereals: Bran Buds                             Bran Chex Just Right                              Mini-Wheats Special K         Swiss muesli  Fruits: Banana (under-ripe)             Dates Figs                                      Grapes Kiwi                                      Mango Oranges                               Raisins  Fruit Juices: Cranberry juice                    Orange juice  Beans and legumes: Boston-type baked beans Canned pinto, kidney,  or navy beans Green peas  Vegetables: Beets                         Raw Carrots  Sweet potato              Yam Corn on the cob  Breads: Pita (pocket) bread          Oat bran bread Pumpernickel bread           Rye bread Wheat bread, high fiber       Grains: Cornmeal                           Rice, brown   Rice, white                         Couscous Pasta: Macaroni                           Pizza  cheese Raviolimeat filled           Spaghetti, white        Nuts: Cashews                           Macadamia  Snacks: Chocolate                    Ice cream,lowfat  Muffin                               Popcorn High Glycemic Foods (70-100)   Breakfast Cereals: Cheerios                 Corn Chex Corn Flakes            Cream of Wheat Grape Nuts              Grape Nut Flakes Life                 Nutri-Grain       Puffed Rice               Puffed Wheat Rice Chex                 Rice Krispies Shredded Wheat               Team Total Fruits: Pineapple                 Watermelon Banana (over-ripe)  Beverages: Sodas, sweet tea, pineapple juice  Vegetables: Potato, baked, boiled, fried, mashed French fries Canned or frozen corn Cooked carrots Parsnips Winter squash  Breads: Most breads (white and whole grain) Bagels                     Bread sticks Bread stuffing          Kaiser roll Dinner rolls  Grains: Rice, instant          Tapioca, with milk  Candy and most cookies Snacks: Donuts                      Corn chips        Jelly beans                 Pretzels Pastries                             Restaurant and ethnic foods Most Chinese food (sugar in stir fry or wok sauces) Teriyaki-style meats and vegetables     If you have lab work done today you will be contacted with your lab results within the next 2 weeks.  If you have not heard from us then please contact us. The fastest way to get your results is to register for My Chart.   IF you received an  x-ray today, you will receive an invoice from [facility]. Please contact [facility] at [phone] with questions or concerns regarding your invoice.   IF you received labwork today, you will receive an invoice from [facility]. Please contact [facility] at [phone] with questions or concerns regarding your invoice.   Our billing staff will not be able to assist you with questions regarding bills from these companies.  You will be contacted with the lab results as soon as they are available. The fastest way to get your results is to activate your My Chart account. Instructions are located on the last page of this paperwork. If you have not heard from us regarding the results in 2 weeks, please contact this office.     

## 2019-04-25 NOTE — Progress Notes (Signed)
Established Patient Office Visit  Subjective:  Patient ID: Marie Tanner, female    DOB: 1956-04-26  Age: 63 y.o. MRN: [id number]  CC:  Chief Complaint  Patient presents with  . Follow-up    per stallings (constipation, abd. pain and diabetes)    HPI Marie Tanner presents for   Constipation Patient reports that she taking the miralax which is helping her constipation and abdominal pain She denies any loose stools with the miralax She states that is makes her BM softer She has a BM every other day She reports just scant blood in her stools yesterday but thinks it is because she had hard stools She denies rectal itching She denies family history of colon cancer She has a consultation with GI who changed her regimen from fiber and probiotic to miralax and lots of water  Diabetes Mellitus: Patient presents for follow up of diabetes. Symptoms: polydipsia. Symptoms have gradually worsened. Patient denies hypoglycemia , paresthesia of the feet, polydipsia, visual disturbances, vomitting and weight loss.  Evaluation to date has been included: hemoglobin A1C.  Home sugars: BGs are running  consistent with Hgb A1C. Treatment to date: Continued metformin which has been effective.  She is taking the metformin which makes her feel gassy.  She is compliant with her metformin. Component                                                         Latest Ref Rng & Units 10/03/2018 04/25/2019  Hemoglobin A1C                                                         4.0 - 5.6 % 6.1 (A) 7.0 (A)    Obesity Body mass index is 36.14 kg/m. She is walking more due to her constipation She is eating healthy snacks like vanilla greek yogurt She is sticking to a healthy diet She eats eggs and grits for breakfast or sometimes an aptifast shake  Wt Readings from Last 3 Encounters:  04/25/19 197 lb 9.6 oz (89.6 kg)  04/03/19 193 lb 9.6 oz (87.8 kg)  03/15/19 192 lb 11.2 oz (87.4 kg)     Past Medical  History:  Diagnosis Date  . Breast cancer ([place])   . Cancer ([place])   . Coronary artery disease   . Depression   . DVT (deep venous thrombosis) ([place]) 11/15   left femerol artery injury during resusciation.  [name] GERD (gastroesophageal reflux disease)   . HCAP (healthcare-associated pneumonia) 11/22/2014  . Heart murmur   . High cholesterol   . History of blood transfusion 09/2014   related to MI  . History of gout   . Hypertension   . Liver laceration 10/19/14  . Pulmonary edema 10/19/14  . Renal disorder   . Sleep apnea    "suppose to wear mask but I don't" (11/22/2014)  . STEMI (ST elevation myocardial infarction) ([place]) 10/19/14  . Type II diabetes mellitus ([place])     Past Surgical History:  Procedure Laterality Date  . ABDOMINAL HYSTERECTOMY  ~ 2000  . APPLICATION OF WOUND VAC  10/2014   "over my naval" (  11/22/2014)  . BREAST LUMPECTOMY WITH AXILLARY LYMPH NODE BIOPSY    . BREAST SURGERY    . [place]; 1982  . CORONARY ANGIOPLASTY WITH STENT PLACEMENT  09/2014   "multiple"/notes 11/22/2014  . EXTRACORPOREAL CIRCULATION    . Femerol artery repair Right 09/2014   pt insists it was a right femoral artery repair on 11/22/2014  . LAPAROSCOPIC ABDOMINAL EXPLORATION    . LEFT HEART CATH AND CORONARY ANGIOGRAPHY N/A 12/07/2017   Procedure: LEFT HEART CATH AND CORONARY ANGIOGRAPHY;  Surgeon: Leonie Man, MD;  Location: [facility];  Service: Cardiovascular;  Laterality: N/A;  . LEFT VENTRICULAR ASSIST DEVICE     pt is not aware of this hx on 11/22/2014  . Liver laceration repair  09/2014   related to CPR/notes 11/22/2014  . TRACHEOSTOMY  09/2014   "closed on it's own when they took it out"  . TUBAL LIGATION  1982    Family History  Problem Relation Age of Onset  . Diabetes Mother   . Hypertension Mother   . COPD Mother   . Heart attack Father   . Alzheimer's disease Father   . Diabetes Brother   . Stroke Neg Hx   . Colon cancer Neg Hx   . Stomach  cancer Neg Hx   . Pancreatic cancer Neg Hx   . AAA (abdominal aortic aneurysm) Neg Hx     Social History   Socioeconomic History  . Marital status: Divorced    Spouse name: Not on file  . Number of children: 2  . Years of education: Not on file  . Highest education level: Not on file  Occupational History  . Not on file  Social Needs  . Financial resource strain: Not on file  . Food insecurity:    Worry: Not on file    Inability: Not on file  . Transportation needs:    Medical: Not on file    Non-medical: Not on file  Tobacco Use  . Smoking status: Never Smoker  . Smokeless tobacco: Never Used  Substance and Sexual Activity  . Alcohol use: Yes    Comment: occ  . Drug use: No  . Sexual activity: Not Currently  Lifestyle  . Physical activity:    Days per week: Not on file    Minutes per session: Not on file  . Stress: Not on file  Relationships  . Social connections:    Talks on phone: Not on file    Gets together: Not on file    Attends religious service: Not on file    Active member of club or organization: Not on file    Attends meetings of clubs or organizations: Not on file    Relationship status: Not on file  . Intimate partner violence:    Fear of current or ex partner: Not on file    Emotionally abused: Not on file    Physically abused: Not on file    Forced sexual activity: Not on file  Other Topics Concern  . Not on file  Social History Narrative  . Not on file    Outpatient Medications Prior to Visit  Medication Sig Dispense Refill  . acetaminophen (TYLENOL) 500 MG tablet Take 1,000 mg by mouth 2 (two) times daily as needed (pain).     [name] albuterol (PROVENTIL HFA;VENTOLIN HFA) 108 (90 Base) MCG/ACT inhaler Inhale 2 puffs into the lungs every 4 (four) hours as needed for wheezing or shortness of breath (  cough, shortness of breath or wheezing.). 1 Inhaler 1  . aspirin EC 81 MG tablet Take 1 tablet (81 mg total) by mouth daily.    [name] BIDIL 20-37.5 MG  tablet TAKE 1 TABLET BY MOUTH TWICE A DAY 60 tablet 5  . blood glucose meter kit and supplies KIT by Does not apply route daily as needed. Dispense based on patient and insurance preference. Use up to four times daily as directed. (FOR ICD-9 250.00, 250.01).    . BRILINTA 60 MG TABS tablet TAKE 1 TABLET (60 MG TOTAL) BY MOUTH 2 (TWO) TIMES DAILY. 180 tablet 2  . Evolocumab (REPATHA SURECLICK) 828 MG/ML SOAJ Inject 140 mg into the skin every 14 (fourteen) days. 2 pen 11  . exemestane (AROMASIN) 25 MG tablet Take 25 mg by mouth daily after breakfast.    . ezetimibe (ZETIA) 10 MG tablet TAKE 1 TABLET BY MOUTH EVERY DAY 30 tablet 5  . furosemide (LASIX) 40 MG tablet TAKE 1 TABLET AS NEEDED FOR SHORTNESS OF BREATH OR WEIGHT GAIN OF 2 POUNDS IN 24 HOURS OR 5 POUNDS IN 1 WEEK 30 tablet 1  . glucose blood test strip Use as instructed. E11.5. To check blood glucose daily. 100 each 12  . loratadine (CLARITIN) 10 MG tablet Take 10 mg by mouth daily as needed for allergies.     [name] losartan (COZAAR) 25 MG tablet Take 25 mg by mouth as directed. TAKE 1 TABLET BY MOUTH DAILY    . magnesium oxide (MAG-OX) 400 MG tablet TAKE 1 TABLET BY MOUTH EVERY DAY 30 tablet 1  . metFORMIN (GLUCOPHAGE-XR) 500 MG 24 hr tablet Take 1 tablet (500 mg total) by mouth daily with breakfast. 90 tablet 1  . metoprolol succinate (TOPROL-XL) 25 MG 24 hr tablet Take 1 tablet (25 mg total) by mouth daily. 30 tablet 11  . nitroGLYCERIN (NITROSTAT) 0.4 MG SL tablet Place 1 tablet (0.4 mg total) under the tongue every 5 (five) minutes as needed for chest pain. max of 3 if no relief call 911 25 tablet PRN  . pantoprazole (PROTONIX) 40 MG tablet Take 1 tablet (40 mg total) by mouth daily. 30 tablet 11  . rosuvastatin (CRESTOR) 20 MG tablet Take 1/2 tablet by mouth 3 times per week 30 tablet 2  . spironolactone (ALDACTONE) 25 MG tablet Take 1 tablet (25 mg total) by mouth daily. 30 tablet 5  . SUMAtriptan (IMITREX) 25 MG tablet Take one tablet at  the onset of headache. May repeat in 2 hours if headache persists or recurs. 30 tablet 3   No facility-administered medications prior to visit.     Allergies  Allergen Reactions  . Dilaudid [Hydromorphone Hcl] Anaphylaxis, Itching and Swelling  . Hydromorphone Anaphylaxis, Itching and Swelling  . Epinephrine Anxiety and Other (See Comments)    Severe anxiety  . Lactose Intolerance (Gi) Diarrhea    ROS Review of Systems Review of Systems  Constitutional: Negative for activity change, appetite change, chills and fever.  HENT: Negative for congestion, nosebleeds, trouble swallowing and voice change.   Respiratory: Negative for cough, shortness of breath and wheezing.   Gastrointestinal: Negative for diarrhea, nausea and vomiting. +constipation, see hpi Genitourinary: Negative for difficulty urinating, dysuria, flank pain and hematuria.  Musculoskeletal: Negative for back pain, joint swelling and neck pain.  Neurological: Negative for dizziness, speech difficulty, light-headedness and numbness.  See HPI. All other review of systems negative.     Objective:    Physical Exam  BP 126/81 (BP  Location: Right Arm, Patient Position: Sitting, Cuff Size: Large)   Pulse 81   Temp 98.7 F (37.1 C) (Oral)   Resp 16   Ht 5' 2" (1.575 m)   Wt 197 lb 9.6 oz (89.6 kg)   SpO2 99%   BMI 36.14 kg/m  Wt Readings from Last 3 Encounters:  04/25/19 197 lb 9.6 oz (89.6 kg)  04/03/19 193 lb 9.6 oz (87.8 kg)  03/15/19 192 lb 11.2 oz (87.4 kg)    Physical Exam  Constitutional: Oriented to person, place, and time. Appears well-developed and well-nourished.  HENT:  Head: Normocephalic and atraumatic.  Eyes: Conjunctivae and EOM are normal.  Cardiovascular: Normal rate, regular rhythm, normal heart sounds and intact distal pulses.  No murmur heard. Pulmonary/Chest: Effort normal and breath sounds normal. No stridor. No respiratory distress. Has no wheezes.  Abdomen: obese, nondistended,  normoactive bs, soft, mild tenderness in the LLQ, no rebound, no guarding Neurological: Is alert and oriented to person, place, and time.  Skin: Skin is warm. Capillary refill takes less than 2 seconds.  Psychiatric: Has a normal mood and affect. Behavior is normal. Judgment and thought content normal.   Health Maintenance Due  Topic Date Due  . Hepatitis C Screening  10-Jul-1956  . OPHTHALMOLOGY EXAM  08/03/1968  . HIV Screening  08/03/1973  . HEMOGLOBIN A1C  04/03/2019    There are no preventive care reminders to display for this patient.  Lab Results  Component Value Date   TSH 2.560 10/28/2017   Lab Results  Component Value Date   WBC 3.1 (L) 12/03/2017   HGB 12.1 12/03/2017   HCT 35.6 12/03/2017   MCV 82 12/03/2017   PLT 258 12/03/2017   Lab Results  Component Value Date   NA 139 11/09/2018   K 4.9 11/09/2018   CO2 23 11/09/2018   GLUCOSE 89 11/09/2018   BUN 14 11/09/2018   CREATININE 0.67 11/09/2018   BILITOT 0.6 04/05/2018   ALKPHOS 83 04/05/2018   AST 17 04/05/2018   ALT 11 04/05/2018   PROT 7.9 04/05/2018   ALBUMIN 4.2 04/05/2018   CALCIUM 9.6 11/09/2018   ANIONGAP 10 04/10/2016   Lab Results  Component Value Date   CHOL 101 12/20/2018   Lab Results  Component Value Date   HDL 56 12/20/2018   Lab Results  Component Value Date   LDLCALC 36 12/20/2018   Lab Results  Component Value Date   TRIG 43 12/20/2018   Lab Results  Component Value Date   CHOLHDL 1.8 12/20/2018   Lab Results  Component Value Date   HGBA1C 7.0 (A) 04/25/2019      Assessment & Plan:   Problem List Items Addressed This Visit      Endocrine   DM type 2 (diabetes mellitus, type 2) ([place]) - a1c increased Discussed some nutrition tips such as buying unsweetended  Yogurt and adding her own sugars Using lower glycemic index fruits Increasing exercise  3 months follow up for diabetes check   Relevant Orders   CMP14+EGFR   POCT glycosylated hemoglobin (Hb A1C)  (Completed)    Other Visit Diagnoses    Other constipation    -  Primary Improved with miralax Continues to have mild LLQ pain Follow up with GI for colonoscopy as she has classic symptoms of diverticulosis (no acute abdomen)   Class 2 severe obesity due to excess calories with serious comorbidity and body mass index (BMI) of 36.0 to 36.9 in adult [facility])     -  Discussed glycemic index, exercise and weight loss      No orders of the defined types were placed in this encounter.   Follow-up: Return in about 3 months (around 07/26/2019) for diabetes mellitus .    Forrest Moron, MD

## 2019-04-25 NOTE — Telephone Encounter (Signed)
Left message for pt to call back to book in-person FU 04/28/19 either 9:30 or 10:30 AM for chronic constipation.

## 2019-04-27 ENCOUNTER — Other Ambulatory Visit: Payer: Self-pay | Admitting: Family Medicine

## 2019-04-28 NOTE — Telephone Encounter (Signed)
In person visit follow up has been scheduled for 05-10-2019

## 2019-05-09 ENCOUNTER — Telehealth: Payer: Self-pay | Admitting: General Surgery

## 2019-05-09 NOTE — Telephone Encounter (Signed)
Covid-19 screening questions  Have you traveled in the last 14 days?NO If yes where?  Do you now or have you had a fever in the last 14 days? NO  Do you have any respiratory symptoms of shortness of breath or cough now or in the last 14 days? NO  Do you have any family members or close contacts with diagnosed or suspected Covid-19 in the past 14 days? YES, Niece who lives with her  Have you been tested for Covid-19 and found to be positive? No  The patients niece lives with her and has been exposed to Covid 19, she has had a cough and running a fever. The patient is going to cancel her appointment and would like to just schedule her colonoscopy. Expressed to the patient to be vigilant with washing her hands and wearing a mask. I also expressed the importance of testing and quarantine if she suspects exposure.

## 2019-05-10 ENCOUNTER — Ambulatory Visit: Payer: BC Managed Care – PPO | Admitting: Gastroenterology

## 2019-05-10 NOTE — Telephone Encounter (Signed)
This patient is medically complex, only had a telephone evaluation with me, and needs to be seen for an in person clinic visit.  We need to find out if her niece has been tested for COVID, and if so, what those results are so we can determine the appropriate timing of this patient coming to see me.

## 2019-05-10 NOTE — Telephone Encounter (Signed)
Spoke with [name], She stated her niece has not been tested yet and she will call me as soon as she knows something. I expressed to her that we needed to see her in person before we could schedule her colonoscopy. The patient verbalized understanding.

## 2019-05-17 NOTE — Telephone Encounter (Signed)
Spoke with the patient and scheduled her 05/23/2019 @ 8:30am. The patient stated that her niece tested negative for COVID-19 and they are both feeling good.

## 2019-05-22 ENCOUNTER — Telehealth: Payer: Self-pay

## 2019-05-22 NOTE — Telephone Encounter (Addendum)
Covid-19 screening questions   Do you now or have you had a fever in the last 14 days? no  Do you have any respiratory symptoms of shortness of breath or cough now or in the last 14 days? No   Do you have any family members or close contacts with diagnosed or suspected Covid-19 in the past 14 days? No  Have you been tested for Covid-19 and found to be positive? No        

## 2019-05-23 ENCOUNTER — Telehealth: Payer: Self-pay

## 2019-05-23 ENCOUNTER — Ambulatory Visit: Payer: BC Managed Care – PPO | Admitting: Gastroenterology

## 2019-05-23 ENCOUNTER — Encounter: Payer: Self-pay | Admitting: Gastroenterology

## 2019-05-23 VITALS — BP 128/82 | HR 88 | Temp 98.6°F | Ht 62.0 in | Wt 193.0 lb

## 2019-05-23 DIAGNOSIS — K5909 Other constipation: Secondary | ICD-10-CM

## 2019-05-23 DIAGNOSIS — I519 Heart disease, unspecified: Secondary | ICD-10-CM

## 2019-05-23 DIAGNOSIS — K625 Hemorrhage of anus and rectum: Secondary | ICD-10-CM

## 2019-05-23 DIAGNOSIS — I25119 Atherosclerotic heart disease of native coronary artery with unspecified angina pectoris: Secondary | ICD-10-CM | POA: Diagnosis not present

## 2019-05-23 MED ORDER — NA SULFATE-K SULFATE-MG SULF 17.5-3.13-1.6 GM/177ML PO SOLN: 1.0000 | Freq: Once | ORAL | 0 refills | Status: AC

## 2019-05-23 NOTE — Telephone Encounter (Signed)
[facility] Pre-operative Risk Assessment     Request for surgical clearance:     Endoscopy Procedure  What type of surgery is being performed?    Colonoscopy   When is this surgery scheduled?     06-08-2019  What type of clearance is required ?   Pharmacy  Are there any medications that need to be held prior to surgery and how long? Yes, Brilinta, 5 days  Practice name and name of physician performing surgery?      [facility]  What is your office phone and fax number?      Phone- [phone]  Fax872 [phone]  Anesthesia type (None, local, MAC, general) ?       MAC

## 2019-05-23 NOTE — Patient Instructions (Addendum)
If you are age 61 or older, your body mass index should be between 23-30. Your Body mass index is 35.3 kg/m. If this is out of the aforementioned range listed, please consider follow up with your Primary Care Provider.  If you are age 58 or younger, your body mass index should be between 19-25. Your Body mass index is 35.3 kg/m. If this is out of the aformentioned range listed, please consider follow up with your Primary Care Provider.   You have been scheduled for a colonoscopy. Please follow written instructions given to you at your visit today.  Please pick up your prep supplies at the pharmacy within the next 1-3 days. If you use inhalers (even only as needed), please bring them with you on the day of your procedure.  Please take the Miralax twice a day for 3 days prior to the colonoscopy prep.   You will be contacted by our office prior to your procedure for directions on holding your Brilinta.  If you do not hear from our office 1 week prior to your scheduled procedure, please call [phone] to discuss.   It was a pleasure to see you today!  Dr. Loletha Carrow

## 2019-05-23 NOTE — Telephone Encounter (Signed)
   Primary Cardiologist: Skeet Latch, MD  Chart reviewed as part of pre-operative protocol coverage. Patient was contacted 05/23/2019 in reference to pre-operative risk assessment for pending surgery as outlined below.  Marie Tanner was last seen on 03/15/19 by Almyra Deforest PA-C. She has history of CAD (STEMI and cardiac arrest in 2015 at [facility] s/p DES to LM/LAD and LCx), prolonged hospitalization with liver lac from CPR and respiratory failure requiring trache, HTN, DM, chronic combined CHF, OSA not on CPAP, prior DVT, breast CA and mitral regurgitation. Last cath 11/2017 without obvious culprit for abnormal stress test, EF 35-45% by visual estimate. ETT 10/2018 was normal.  Patient remains on Brilinta 60mg  BID in addition to ASA. Will route to Dr. Margaretann Loveless who is covering for Dr. Oval Linsey for input on holding Brilinta for endoscopy, then patient will need call for update on how she is doing.  Charlie Pitter, PA-C 05/23/2019, 11:54 AM

## 2019-05-23 NOTE — Progress Notes (Signed)
[facility] Progress Note  Chief Complaint: Chronic constipation  Subjective  History:  Marie Tanner is happy to report that once daily MiraLAX has improved the constipation, where she now has a BM about every other day.  The stool is soft, she strains less often, and bleeding has decreased. She has lower abdominal bloating and sometimes cramps before bowel movement, but that has improved as well. No chronic heartburn dysphagia vomiting or weight loss. ROS: Cardiovascular: As mentioned in initial consult note, occasional angina, no ischemic changes on stress test late last year. Respiratory: no dyspnea Arthralgias Chronic right hand edema since breast surgery Remainder of systems negative except as above The patient's Past Medical, Family and Social History were reviewed and are on file in the EMR.  Objective:  Med list reviewed  Current Outpatient Medications:  .  acetaminophen (TYLENOL) 500 MG tablet, Take 1,000 mg by mouth 2 (two) times daily as needed (pain). , Disp: , Rfl:  .  albuterol (PROVENTIL HFA;VENTOLIN HFA) 108 (90 Base) MCG/ACT inhaler, Inhale 2 puffs into the lungs every 4 (four) hours as needed for wheezing or shortness of breath (cough, shortness of breath or wheezing.)., Disp: 1 Inhaler, Rfl: 1 .  aspirin EC 81 MG tablet, Take 1 tablet (81 mg total) by mouth daily., Disp: , Rfl:  .  BIDIL 20-37.5 MG tablet, TAKE 1 TABLET BY MOUTH TWICE A DAY, Disp: 60 tablet, Rfl: 5 .  blood glucose meter kit and supplies KIT, by Does not apply route daily as needed. Dispense based on patient and insurance preference. Use up to four times daily as directed. (FOR ICD-9 250.00, 250.01)., Disp: , Rfl:  .  BRILINTA 60 MG TABS tablet, TAKE 1 TABLET (60 MG TOTAL) BY MOUTH 2 (TWO) TIMES DAILY., Disp: 180 tablet, Rfl: 2 .  Evolocumab (REPATHA SURECLICK) 410 MG/ML SOAJ, Inject 140 mg into the skin every 14 (fourteen) days., Disp: 2 pen, Rfl: 11 .  exemestane (AROMASIN) 25 MG tablet, Take  25 mg by mouth daily after breakfast., Disp: , Rfl:  .  ezetimibe (ZETIA) 10 MG tablet, TAKE 1 TABLET BY MOUTH EVERY DAY, Disp: 30 tablet, Rfl: 5 .  furosemide (LASIX) 40 MG tablet, TAKE 1 TABLET AS NEEDED FOR SHORTNESS OF BREATH OR WEIGHT GAIN OF 2 POUNDS IN 24 HOURS OR 5 POUNDS IN 1 WEEK, Disp: 30 tablet, Rfl: 1 .  glucose blood test strip, Use as instructed. E11.5. To check blood glucose daily., Disp: 100 each, Rfl: 12 .  loratadine (CLARITIN) 10 MG tablet, Take 10 mg by mouth daily as needed for allergies. , Disp: , Rfl:  .  losartan (COZAAR) 25 MG tablet, Take 25 mg by mouth as directed. TAKE 1 TABLET BY MOUTH DAILY, Disp: , Rfl:  .  magnesium oxide (MAG-OX) 400 MG tablet, TAKE 1 TABLET BY MOUTH EVERY DAY, Disp: 90 tablet, Rfl: 0 .  metFORMIN (GLUCOPHAGE-XR) 500 MG 24 hr tablet, Take 1 tablet (500 mg total) by mouth daily with breakfast., Disp: 90 tablet, Rfl: 1 .  metoprolol succinate (TOPROL-XL) 25 MG 24 hr tablet, Take 1 tablet (25 mg total) by mouth daily., Disp: 30 tablet, Rfl: 11 .  nitroGLYCERIN (NITROSTAT) 0.4 MG SL tablet, Place 1 tablet (0.4 mg total) under the tongue every 5 (five) minutes as needed for chest pain. max of 3 if no relief call 911, Disp: 25 tablet, Rfl: PRN .  pantoprazole (PROTONIX) 40 MG tablet, Take 1 tablet (40 mg total) by mouth daily., Disp: 30  tablet, Rfl: 11 .  polyethylene glycol (MIRALAX / GLYCOLAX) 17 g packet, Take 17 g by mouth daily., Disp: , Rfl:  .  rosuvastatin (CRESTOR) 20 MG tablet, Take 1/2 tablet by mouth 3 times per week, Disp: 30 tablet, Rfl: 2 .  spironolactone (ALDACTONE) 25 MG tablet, Take 1 tablet (25 mg total) by mouth daily., Disp: 30 tablet, Rfl: 5 .  SUMAtriptan (IMITREX) 25 MG tablet, Take one tablet at the onset of headache. May repeat in 2 hours if headache persists or recurs., Disp: 30 tablet, Rfl: 3   Vital signs in last 24 hrs: Vitals:   05/23/19 0835  BP: 128/82  Pulse: 88  Temp: 98.6 F (37 C)    Physical Exam   Well-appearing, pleasant and conversational  HEENT: sclera anicteric, oral mucosa moist without lesions  Neck: supple, no thyromegaly, JVD or lymphadenopathy  Cardiac: RRR without murmurs, S1S2 heard, no peripheral edema.  Right hand edema   Pulm: clear to auscultation bilaterally, normal RR and effort noted  Abdomen: soft, no tenderness, with active bowel sounds. No guarding or palpable hepatosplenomegaly.  She has a long midline scar that is an extension of her sternotomy scar.  She says it was from emergency repair of a liver laceration during the time of her cardiac arrest.  No visional hernia  skin; warm and dry, no jaundice or rash  Recent Labs:  No recent labs  Radiologic studies:  Echo report from Aug 2019 reviewed - LVEF 45-50%   _0 @ Assessment: Encounter Diagnoses  Name Primary?  . Chronic constipation Yes  . Rectal bleeding   . Coronary artery disease involving native coronary artery of native heart with angina pectoris ([place])   . Left ventricular systolic dysfunction     No clear explanation for recent change in bowel habits, been improved on MiraLAX.  Bleeding seems now more likely to be benign anal rectal bleeding, but also needs investigation.  Plan: Colonoscopy.  She was agreeable after discussion of procedure and risks.  The benefits and risks of the planned procedure were described in detail with the patient or (when appropriate) their health care proxy.  Risks were outlined as including, but not limited to, bleeding, infection, perforation, adverse medication reaction leading to cardiac or pulmonary decompensation.  The limitation of incomplete mucosal visualization was also discussed.  No guarantees or warranties were given. Patient at increased risk for cardiopulmonary complications of procedure due to medical comorbidities.   She understands the small but real risk of cardiac event during the period of time she is off Plavix 5 days  prior, and perhaps 1 or 2 days after, the procedure.  We will message her cardiologist to see if they are agreeable.  The patient would remain on aspirin.  Continue once daily MiraLAX.  Total time 27 minutes, over half spent face-to-face with patient in counseling and coordination of care.   Nelida Meuse III

## 2019-05-31 NOTE — Telephone Encounter (Signed)
   Primary Cardiologist: Skeet Latch, MD  Chart reviewed and patient interviewed over the phone today as part of pre-operative protocol coverage. Given past medical history and time since last visit, based on ACC/AHA guidelines, Aquarius Tremper would be at acceptable risk for the planned procedure without further cardiovascular testing.   OK to hold Brilinta 3-5 days pre op if needed.   I will route this recommendation to the requesting party via Epic fax function and remove from pre-op pool.  Please call with questions.  Kerin Ransom, PA-C 05/31/2019, 11:03 AM

## 2019-05-31 NOTE — Telephone Encounter (Signed)
Continue ASA through procedure.  Ok to hold ticagrelor if deemed necessary from procedural standpoint, 3-5 days before procedure.   Restart ticagrelor as soon as it is felt to be safe from GI perspective.  Patient should be informed that indefinite ticagrelor therapy is felt to be indicated by primary cardiologist Dr. Oval Linsey, due to cardiac arrest and STEMI. By stopping this medicine, Marie Tanner is at a higher risk of adverse cardiac events, and if this risk is felt to be acceptable by proceduralist and patient, ticagrelor can be held for the procedure.

## 2019-05-31 NOTE — Telephone Encounter (Signed)
Pt has already been notified and aware to hold her Brilinta prior to her scheduled procedure on 06-08-2019

## 2019-06-07 ENCOUNTER — Telehealth: Payer: Self-pay | Admitting: Gastroenterology

## 2019-06-07 NOTE — Telephone Encounter (Signed)

## 2019-06-08 ENCOUNTER — Ambulatory Visit (AMBULATORY_SURGERY_CENTER): Payer: BC Managed Care – PPO | Admitting: Gastroenterology

## 2019-06-08 ENCOUNTER — Other Ambulatory Visit: Payer: Self-pay

## 2019-06-08 ENCOUNTER — Encounter: Payer: Self-pay | Admitting: Gastroenterology

## 2019-06-08 VITALS — BP 160/74 | HR 61 | Temp 98.5°F | Resp 16 | Ht 62.0 in | Wt 193.0 lb

## 2019-06-08 DIAGNOSIS — K5909 Other constipation: Secondary | ICD-10-CM

## 2019-06-08 MED ORDER — SODIUM CHLORIDE 0.9 % IV SOLN: 500.0000 mL | Freq: Once | INTRAVENOUS | Status: DC

## 2019-06-08 NOTE — Progress Notes (Signed)
To PACU, VSS. Report to [email or web address].tb 

## 2019-06-08 NOTE — Patient Instructions (Signed)
YOU HAD AN ENDOSCOPIC PROCEDURE TODAY AT [facility]:   Refer to the procedure report that was given to you for any specific questions about what was found during the examination.  If the procedure report does not answer your questions, please call your gastroenterologist to clarify.  If you requested that your care partner not be given the details of your procedure findings, then the procedure report has been included in a sealed envelope for you to review at your convenience later.  YOU SHOULD EXPECT: Some feelings of bloating in the abdomen. Passage of more gas than usual.  Walking can help get rid of the air that was put into your GI tract during the procedure and reduce the bloating. If you had a lower endoscopy (such as a colonoscopy or flexible sigmoidoscopy) you may notice spotting of blood in your stool or on the toilet paper. If you underwent a bowel prep for your procedure, you may not have a normal bowel movement for a few days.  Please Note:  You might notice some irritation and congestion in your nose or some drainage.  This is from the oxygen used during your procedure.  There is no need for concern and it should clear up in a day or so.  SYMPTOMS TO REPORT IMMEDIATELY:   Following lower endoscopy (colonoscopy or flexible sigmoidoscopy):  Excessive amounts of blood in the stool  Significant tenderness or worsening of abdominal pains  Swelling of the abdomen that is new, acute  Fever of 100F or higher  For urgent or emergent issues, a gastroenterologist can be reached at any hour by calling [phone].   DIET:  We do recommend a small meal at first, but then you may proceed to your regular diet.  Drink plenty of fluids but you should avoid alcoholic beverages for 24 hours.  MEDICATIONS: Continue present medications. Resume Brilinta at prior dose today.  Please see handouts given to you by your recovery nurse.  Have you Primary Care Provider consider drawing  labs for a TSH.  ACTIVITY:  You should plan to take it easy for the rest of today and you should NOT DRIVE or use heavy machinery until tomorrow (because of the sedation medicines used during the test).    FOLLOW UP: Our staff will call the number listed on your records 48-72 hours following your procedure to check on you and address any questions or concerns that you may have regarding the information given to you following your procedure. If we do not reach you, we will leave a message.  We will attempt to reach you two times.  During this call, we will ask if you have developed any symptoms of COVID 19. If you develop any symptoms (ie: fever, flu-like symptoms, shortness of breath, cough etc.) before then, please call [phone].  If you test positive for Covid 19 in the 2 weeks post procedure, please call and report this information to [place].    If any biopsies were taken you will be contacted by phone or by letter within the next 1-3 weeks.  Please call us at [phone] if you have not heard about the biopsies in 3 weeks.   Thank you for allowing [place] to provide for your healthcare needs today.   SIGNATURES/CONFIDENTIALITY: You and/or your care partner have signed paperwork which will be entered into your electronic medical record.  These signatures attest to the fact that that the information above on your After Visit Summary has been reviewed  and is understood.  Full responsibility of the confidentiality of this discharge information lies with you and/or your care-partner. 

## 2019-06-08 NOTE — Progress Notes (Signed)
CBG 137 after 15 minutes following a 1/2 amp (55ml)of D50IV

## 2019-06-08 NOTE — Op Note (Signed)
[place] Patient Name: Marie Tanner Procedure Date: 06/08/2019 3:13 PM MRN: [id number] Endoscopist: [place]. Marie Tanner , MD Age: 63 Referring MD:  Date of Birth: 1956/06/12 Gender: Female Account #: [id number] Procedure:                Colonoscopy Indications:              Constipation (chronic for years, worsened last few                            months) - has improved on miralax Medicines:                Monitored Anesthesia Care Procedure:                Pre-Anesthesia Assessment:                           - Prior to the procedure, a History and Physical                            was performed, and patient medications and                            allergies were reviewed. The patient's tolerance of                            previous anesthesia was also reviewed. The risks                            and benefits of the procedure and the sedation                            options and risks were discussed with the patient.                            All questions were answered, and informed consent                            was obtained. Prior Anticoagulants: The patient                            last took antiplatelet medication (Brilinta) 5 days                            prior to the procedure and remained on aspirin. ASA                            Grade Assessment: III - A patient with severe                            systemic disease. After reviewing the risks and                            benefits, the patient was deemed in satisfactory  condition to undergo the procedure.                           After obtaining informed consent, the colonoscope                            was passed under direct vision. Throughout the                            procedure, the patient's blood pressure, pulse, and                            oxygen saturations were monitored continuously. The                            Colonoscope was introduced  through the anus and                            advanced to the the cecum, identified by                            appendiceal orifice and ileocecal valve. The                            colonoscopy was performed without difficulty. The                            patient tolerated the procedure well. The quality                            of the bowel preparation was good. The ileocecal                            valve, appendiceal orifice, and rectum were                            photographed. Scope In: 3:28:16 PM Scope Out: 3:38:47 PM Scope Withdrawal Time: 0 hours 7 minutes 12 seconds  Total Procedure Duration: 0 hours 10 minutes 31 seconds  Findings:                 The perianal and digital rectal examinations were                            normal.                           The entire examined colon appeared normal.                           Retroflexion in the rectum was not performed due to                            anatomy. Complications:            No immediate complications. Estimated Blood Loss:  Estimated blood loss: none. Estimated blood loss:                            none. Impression:               - The entire examined colon is normal on direct and                            retroflexion views.                           - No specimens collected. Recommendation:           - Patient has a contact number available for                            emergencies. The signs and symptoms of potential                            delayed complications were discussed with the                            patient. Return to normal activities tomorrow.                            Written discharge instructions were provided to the                            patient.                           - Resume previous diet.                           - Resume Brilinta at prior dose today.                           - Continue present medications.                           - Repeat  colonoscopy in 10 years for screening                            purposes.                           - PCP: consider TSH Marie Tanner L. Marie Carrow, MD 06/08/2019 3:49:04 PM This report has been signed electronically.

## 2019-06-12 ENCOUNTER — Telehealth: Payer: Self-pay

## 2019-06-12 NOTE — Telephone Encounter (Signed)
  Follow up Call-  Call back number 06/08/2019  Post procedure Call Back phone  # [phone]  Permission to leave phone message Yes  Some recent data might be hidden     Patient questions:  Do you have a fever, pain , or abdominal swelling? No. Pain Score  3 *  Have you tolerated food without any problems? Yes.    Have you been able to return to your normal activities? Yes.    Do you have any questions about your discharge instructions: Diet   No. Medications  No. Follow up visit  No.  Do you have questions or concerns about your Care? No.  Actions: * If pain score is 4 or above: No action needed, pain <4. 1. Have you developed a fever since your procedure? no  2.   Have you had an respiratory symptoms (SOB or cough) since your procedure? no  3.   Have you tested positive for COVID 19 since your procedure no  4.   Have you had any family members/close contacts diagnosed with the COVID 19 since your procedure?  no   If yes to any of these questions please route to Joylene John, RN and Alphonsa Gin, Therapist, sports.

## 2019-07-07 ENCOUNTER — Other Ambulatory Visit: Payer: Self-pay | Admitting: Pharmacist Clinician (PhC)/ Clinical Pharmacy Specialist

## 2019-07-07 MED ORDER — PRALUENT 150 MG/ML ~~LOC~~ SOAJ: 150.0000 mg | SUBCUTANEOUS | 12 refills | Status: DC

## 2019-07-07 NOTE — Telephone Encounter (Signed)
[facility] - switch to Praluent 150 mg

## 2019-07-18 ENCOUNTER — Other Ambulatory Visit: Payer: Self-pay

## 2019-07-18 ENCOUNTER — Encounter: Payer: Self-pay | Admitting: Family Medicine

## 2019-07-18 ENCOUNTER — Ambulatory Visit: Payer: BC Managed Care – PPO | Admitting: Family Medicine

## 2019-07-18 VITALS — BP 129/79 | HR 84 | Temp 98.8°F | Resp 17 | Ht 62.0 in | Wt 195.8 lb

## 2019-07-18 DIAGNOSIS — W57XXXA Bitten or stung by nonvenomous insect and other nonvenomous arthropods, initial encounter: Secondary | ICD-10-CM

## 2019-07-18 DIAGNOSIS — E11628 Type 2 diabetes mellitus with other skin complications: Secondary | ICD-10-CM

## 2019-07-18 DIAGNOSIS — I10 Essential (primary) hypertension: Secondary | ICD-10-CM | POA: Diagnosis not present

## 2019-07-18 DIAGNOSIS — Z5181 Encounter for therapeutic drug level monitoring: Secondary | ICD-10-CM

## 2019-07-18 DIAGNOSIS — S50361A Insect bite (nonvenomous) of right elbow, initial encounter: Secondary | ICD-10-CM | POA: Diagnosis not present

## 2019-07-18 LAB — POCT GLYCOSYLATED HEMOGLOBIN (HGB A1C): Hemoglobin A1C: 6.8 % — AB (ref 4.0–5.6)

## 2019-07-18 MED ORDER — TRIAMCINOLONE ACETONIDE 0.1 % EX CREA: 1.0000 "application " | TOPICAL_CREAM | Freq: Two times a day (BID) | CUTANEOUS | 0 refills | Status: DC

## 2019-07-18 NOTE — Progress Notes (Signed)
Established Patient Office Visit  Subjective:  Patient ID: Marie Tanner, female    DOB: Mar 22, 1956  Age: 63 y.o. MRN: [id number]  CC:  Chief Complaint  Patient presents with  . Diabetes    3 month f/u.  Sunday night per pt had the worst itching from her head down, ? bite on right arm itching and swelling, swelling has gone down some.  Itchiness kept pt up and was unable to sleep.    HPI Marie Tanner presents for   Insect Bite Patient reports that she has been Itching and having swelling at the bite area Started on Sunday which was 3 days ago She did not sleep She took benadryl 12.31m She reports that the itching was present most of the day yesterday and had to take another 12.584mof benadryl overnight. She states that her itching is improved  Diabetes Mellitus: Patient presents for follow up of diabetes. Symptoms: none. Symptoms have stabilized. Patient denies hypoglycemia , increase appetite, nausea and paresthesia of the feet.  Evaluation to date has been included: hemoglobin A1C.  Home sugars: BGs range between 96 and 132. Treatment to date: no recent interventions.  Lab Results  Component Value Date   HGBA1C 6.8 (A) 07/18/2019   Hypertension: Patient here for follow-up of elevated blood pressure. She is not exercising and is adherent to low salt diet.  Blood pressure is well controlled at home. Cardiac symptoms none. Patient denies chest pain, claudication, exertional chest pressure/discomfort, lower extremity edema and orthopnea.  Cardiovascular risk factors: diabetes mellitus, dyslipidemia and hypertension. Use of agents associated with hypertension: none. History of target organ damage: angina/ prior myocardial infarction. BP Readings from Last 3 Encounters:  07/18/19 129/79  06/08/19 (!) 160/74  05/23/19 128/82      Past Medical History:  Diagnosis Date  . Breast cancer ([facility]  . Cancer ([facility] [place]  . Coronary artery disease   . Depression   . DVT (deep [place]  thrombosis) ([facility]   left femerol artery injury during resusciation.  . Marland KitchenERD (gastroesophageal reflux disease)   . HCAP (healthcare-associated pneumonia) 11/22/2014  . Heart murmur   . High cholesterol   . History of blood transfusion 09/2014   related to MI  . History of gout   . Hypertension   . Liver laceration 10/19/14  . Pulmonary edema 10/19/14  . Renal disorder   . Sleep apnea    "suppose to wear mask but I don't" (11/22/2014)  . STEMI (ST elevation myocardial infarction) ([facility]  . Type II diabetes mellitus ([facility]    Past Surgical History:  Procedure Laterality Date  . ABDOMINAL HYSTERECTOMY  ~ 2000  . APPLICATION OF WOUND VAC  10/2014   "over my naval" (11/22/2014)  . BREAST LUMPECTOMY WITH AXILLARY LYMPH NODE BIOPSY    . BREAST SURGERY    . [place]  . CORONARY ANGIOPLASTY WITH STENT PLACEMENT  09/2014   "multiple"/notes 11/22/2014  . EXTRACORPOREAL CIRCULATION    . Femerol artery repair Right 09/2014   pt insists it was a right femoral artery repair on 11/22/2014  . LAPAROSCOPIC ABDOMINAL EXPLORATION    . LEFT HEART CATH AND CORONARY ANGIOGRAPHY N/A 12/07/2017   Procedure: LEFT HEART CATH AND CORONARY ANGIOGRAPHY;  Surgeon: HaLeonie ManMD;  [facility];  Service: Cardiovascular;  Laterality: N/A;  . LEFT VENTRICULAR ASSIST DEVICE     pt is not aware of this hx on 11/22/2014  . Liver  laceration repair  09/2014   related to CPR/notes 11/22/2014  . TRACHEOSTOMY  09/2014   "closed on it's own when they took it out"  . TUBAL LIGATION  1982    Family History  Problem Relation Age of Onset  . Diabetes Mother   . Hypertension Mother   . COPD Mother   . Heart attack Father   . Alzheimer's disease Father   . Diabetes Brother   . Stroke Neg Hx   . Colon cancer Neg Hx   . Stomach cancer Neg Hx   . Pancreatic cancer Neg Hx   . AAA (abdominal aortic aneurysm) Neg Hx     Social History   Socioeconomic  History  . Marital status: Divorced    Spouse name: Not on file  . Number of children: 2  . Years of education: Not on file  . Highest education level: Not on file  Occupational History  . Not on file  Social Needs  . Financial resource strain: Not on file  . Food insecurity    Worry: Not on file    Inability: Not on file  . Transportation needs    Medical: Not on file    Non-medical: Not on file  Tobacco Use  . Smoking status: Never Smoker  . Smokeless tobacco: Never Used  Substance and Sexual Activity  . Alcohol use: Yes    Comment: occ  . Drug use: No  . Sexual activity: Not Currently  Lifestyle  . Physical activity    Days per week: Not on file    Minutes per session: Not on file  . Stress: Not on file  Relationships  . Social Herbalist on phone: Not on file    Gets together: Not on file    Attends religious service: Not on file    Active member of club or organization: Not on file    Attends meetings of clubs or organizations: Not on file    Relationship status: Not on file  . Intimate partner violence    Fear of current or ex partner: Not on file    Emotionally abused: Not on file    Physically abused: Not on file    Forced sexual activity: Not on file  Other Topics Concern  . Not on file  Social History Narrative  . Not on file    Outpatient Medications Prior to Visit  Medication Sig Dispense Refill  . acetaminophen (TYLENOL) 500 MG tablet Take 1,000 mg by mouth 2 (two) times daily as needed (pain).     . Alirocumab (PRALUENT) 150 MG/ML SOAJ Inject 150 mg into the skin every 14 (fourteen) days. 2 pen 12  . aspirin EC 81 MG tablet Take 1 tablet (81 mg total) by mouth daily.    . blood glucose meter kit and supplies KIT by Does not apply route daily as needed. Dispense based on patient and insurance preference. Use up to four times daily as directed. (FOR ICD-9 250.00, 250.01).    . BRILINTA 60 MG TABS tablet TAKE 1 TABLET (60 MG TOTAL) BY MOUTH 2  (TWO) TIMES DAILY. 180 tablet 2  . exemestane (AROMASIN) 25 MG tablet Take 25 mg by mouth daily after breakfast.    . ezetimibe (ZETIA) 10 MG tablet TAKE 1 TABLET BY MOUTH EVERY DAY 30 tablet 5  . furosemide (LASIX) 40 MG tablet TAKE 1 TABLET AS NEEDED FOR SHORTNESS OF BREATH OR WEIGHT GAIN OF 2 POUNDS IN 24 HOURS OR  5 POUNDS IN 1 WEEK 30 tablet 1  . glucose blood test strip Use as instructed. E11.5. To check blood glucose daily. 100 each 12  . loratadine (CLARITIN) 10 MG tablet Take 10 mg by mouth daily as needed for allergies.     [name] losartan (COZAAR) 25 MG tablet Take 25 mg by mouth as directed. TAKE 1 TABLET BY MOUTH DAILY    . magnesium oxide (MAG-OX) 400 MG tablet TAKE 1 TABLET BY MOUTH EVERY DAY 90 tablet 0  . metFORMIN (GLUCOPHAGE-XR) 500 MG 24 hr tablet Take 1 tablet (500 mg total) by mouth daily with breakfast. 90 tablet 1  . metoprolol succinate (TOPROL-XL) 25 MG 24 hr tablet Take 1 tablet (25 mg total) by mouth daily. 30 tablet 11  . nitroGLYCERIN (NITROSTAT) 0.4 MG SL tablet Place 1 tablet (0.4 mg total) under the tongue every 5 (five) minutes as needed for chest pain. max of 3 if no relief call 911 25 tablet PRN  . pantoprazole (PROTONIX) 40 MG tablet Take 1 tablet (40 mg total) by mouth daily. 30 tablet 11  . polyethylene glycol (MIRALAX / GLYCOLAX) 17 g packet Take 17 g by mouth daily.    . rosuvastatin (CRESTOR) 20 MG tablet Take 1/2 tablet by mouth 3 times per week 30 tablet 2  . spironolactone (ALDACTONE) 25 MG tablet Take 1 tablet (25 mg total) by mouth daily. 30 tablet 5  . BIDIL 20-37.5 MG tablet TAKE 1 TABLET BY MOUTH TWICE A DAY 60 tablet 5  . albuterol (PROVENTIL HFA;VENTOLIN HFA) 108 (90 Base) MCG/ACT inhaler Inhale 2 puffs into the lungs every 4 (four) hours as needed for wheezing or shortness of breath (cough, shortness of breath or wheezing.). (Patient not taking: Reported on 06/08/2019) 1 Inhaler 1  . SUMAtriptan (IMITREX) 25 MG tablet Take one tablet at the onset of  headache. May repeat in 2 hours if headache persists or recurs. (Patient not taking: Reported on 06/08/2019) 30 tablet 3   Facility-Administered Medications Prior to Visit  Medication Dose Route Frequency Provider Last Rate Last Dose  . 0.9 %  sodium chloride infusion  500 mL Intravenous Once Doran Stabler, MD        Allergies  Allergen Reactions  . Dilaudid [Hydromorphone Hcl] Anaphylaxis, Itching and Swelling  . Hydromorphone Anaphylaxis, Itching and Swelling  . Epinephrine Anxiety and Other (See Comments)    Severe anxiety  . Lactose Intolerance (Gi) Diarrhea    ROS Review of Systems Review of Systems  Constitutional: Negative for activity change, appetite change, chills and fever.  HENT: Negative for congestion, nosebleeds, trouble swallowing and voice change.   Respiratory: Negative for cough, shortness of breath and wheezing.   Gastrointestinal: Negative for diarrhea, nausea and vomiting.  Genitourinary: Negative for difficulty urinating, dysuria, flank pain and hematuria.  Musculoskeletal: Negative for back pain, joint swelling and neck pain.  Neurological: Negative for dizziness, speech difficulty, light-headedness and numbness.  See HPI. All other review of systems negative.     Objective:    Physical Exam  BP 129/79 (BP Location: Left Arm, Patient Position: Sitting, Cuff Size: Large)   Pulse 84   Temp 98.8 F (37.1 C) (Oral)   Resp 17   Ht 5' 2"  (1.575 m)   Wt 195 lb 12.8 oz (88.8 kg)   SpO2 98%   BMI 35.81 kg/m  Wt Readings from Last 3 Encounters:  07/18/19 195 lb 12.8 oz (88.8 kg)  06/08/19 193 lb (87.5 kg)  05/23/19  193 lb (87.5 kg)   Physical Exam  Constitutional: Oriented to person, place, and time. Appears well-developed and well-nourished.  HENT:  Head: Normocephalic and atraumatic.  Eyes: Conjunctivae and EOM are normal.  Cardiovascular: Normal rate, regular rhythm, normal heart sounds and intact distal pulses.  No murmur heard.  Pulmonary/Chest: Effort normal and breath sounds normal. No stridor. No respiratory distress. Has no wheezes.  Neurological: Is alert and oriented to person, place, and time.  Skin: Skin is warm. Capillary refill takes less than 2 seconds.  Psychiatric: Has a normal mood and affect. Behavior is normal. Judgment and thought content normal.    Health Maintenance Due  Topic Date Due  . Hepatitis C Screening  1956/04/17  . OPHTHALMOLOGY EXAM  08/03/1968  . HIV Screening  08/03/1973  . FOOT EXAM  05/03/2019  . INFLUENZA VACCINE  07/01/2019    There are no preventive care reminders to display for this patient.  Lab Results  Component Value Date   TSH 2.560 10/28/2017   Lab Results  Component Value Date   WBC 3.1 (L) 12/03/2017   HGB 12.1 12/03/2017   HCT 35.6 12/03/2017   MCV 82 12/03/2017   PLT 258 12/03/2017   Lab Results  Component Value Date   NA 137 04/25/2019   K 5.0 04/25/2019   CO2 23 04/25/2019   GLUCOSE 111 (H) 04/25/2019   BUN 14 04/25/2019   CREATININE 0.69 04/25/2019   BILITOT 0.5 04/25/2019   ALKPHOS 96 04/25/2019   AST 31 04/25/2019   ALT 23 04/25/2019   PROT 7.8 04/25/2019   ALBUMIN 4.4 04/25/2019   CALCIUM 9.8 04/25/2019   ANIONGAP 10 04/10/2016   Lab Results  Component Value Date   CHOL 101 12/20/2018   Lab Results  Component Value Date   HDL 56 12/20/2018   Lab Results  Component Value Date   LDLCALC 36 12/20/2018   Lab Results  Component Value Date   TRIG 43 12/20/2018   Lab Results  Component Value Date   CHOLHDL 1.8 12/20/2018   Lab Results  Component Value Date   HGBA1C 6.8 (A) 07/18/2019      Assessment & Plan:   Problem List Items Addressed This Visit    None    Visit Diagnoses    Diabetes with skin complication ([place])    -  Primary Discussed that diabetes increases skin complications especially with insect bites    Encounter for medication monitoring    - will monitor a1c   Relevant Orders   POCT glycosylated  hemoglobin (Hb A1C) (Completed)   Insect bite of right elbow, initial encounter     No obvious infection but noted to have inflammation   Relevant Medications   triamcinolone cream (KENALOG) 0.1 %      Hypertension - bp well controlled, continue DASH diet    Meds ordered this encounter  Medications  . triamcinolone cream (KENALOG) 0.1 %    Sig: Apply 1 application topically 2 (two) times daily.    Dispense:  45 g    Refill:  0    Follow-up: No follow-ups on file.    Forrest Moron, MD

## 2019-07-18 NOTE — Patient Instructions (Addendum)
Take a zyrtec in the morning Continue benadryl half tablet at bedtime Apply triamcinolone to the bite area     If you have lab work done today you will be contacted with your lab results within the next 2 weeks.  If you have not heard from [place] then please contact us. The fastest way to get your results is to register for My Chart.   IF you received an x-ray today, you will receive an invoice from [facility]. Please contact [facility] at [phone] with questions or concerns regarding your invoice.   IF you received labwork today, you will receive an invoice from [place]. Please contact [facility] at [phone] with questions or concerns regarding your invoice.   Our billing staff will not be able to assist you with questions regarding bills from these companies.  You will be contacted with the lab results as soon as they are available. The fastest way to get your results is to activate your My Chart account. Instructions are located on the last page of this paperwork. If you have not heard from [place] regarding the results in 2 weeks, please contact this office.     Insect Bite, Adult An insect bite can make your skin red, itchy, and swollen. An insect bite is different from an insect sting, which happens when an insect injects poison (venom) into the skin. Some insects can spread disease to people through a bite. However, most insect bites do not lead to disease and are not serious. What are the causes? Insects may bite for a variety of reasons, including:  Hunger.  To defend themselves. Insects that bite include:  Spiders.  Mosquitoes.  Ticks.  Fleas.  Ants.  Flies.  Kissing bugs.  Chiggers. What are the signs or symptoms? Symptoms of this condition include:  Itching or pain in the bite area.  Redness and swelling in the bite area.  An open wound (skin ulcer). In many cases, symptoms last for 2-4 days. In rare cases, a person may have a severe  allergic reaction (anaphylactic reaction) to a bite. Symptoms of an anaphylactic reaction may include:  Feeling warm in the face (flushed). This may include redness.  Itchy, red, swollen areas of skin (hives).  Swelling of the eyes, lips, face, mouth, tongue, or throat.  Difficulty breathing, speaking, or swallowing.  Noisy breathing (wheezing).  Dizziness or light-headedness.  Fainting.  Pain or cramping in the abdomen.  Vomiting.  Diarrhea. How is this diagnosed? This condition is usually diagnosed based on symptoms and a physical exam. How is this treated? Treatment is usually not needed. Symptoms often go away on their own. When treatment is recommended, it may involve:  Applying a cream or lotion to the bite area. This treatment helps with itching.  Taking an antibiotic medicine. This treatment is needed if the bite area gets infected.  Getting a tetanus shot, if you are not up to date on this vaccine.  Applying ice to the affected area.  Allergy medicines called antihistamines. This treatment may be needed if you develop itching or an allergic reaction to the insect bite.  Giving yourself an epinephrine injection if you have an anaphylactic reaction to a bite. To give the injection, you will use what is commonly called an auto-injector "pen" (pre-filled automatic epinephrine injection device). Your health care provider will teach you how to use an auto-injector pen. Follow these instructions at home: Bite area care   Do not scratch the bite area.  Keep the bite  area clean and dry. Wash it every day with soap and water as told by your health care provider.  Check the bite area every day for signs of infection. Check for: ? Redness, swelling, or pain. ? Fluid or blood. ? Warmth. ? Pus or a bad smell. Managing pain, itching, and swelling   You may apply cortisone cream, calamine lotion, or a paste made of baking soda and water to the bite area as told by your  health care provider.  If directed, put ice on the bite area. ? Put ice in a plastic bag. ? Place a towel between your skin and the bag. ? Leave the ice on for 20 minutes, 2-3 times a day. General instructions  Apply or take over-the-counter and prescription medicines only as told by your health care provider.  If you were prescribed an antibiotic medicine, take or apply it as told by your health care provider. Do not stop using the antibiotic even if your condition improves.  Keep all follow-up visits as told by your health care provider. This is important. How is this prevented? To help reduce your risk of insect bites:  When you are outdoors, wear clothing that covers your arms and legs. This is especially important in the early morning and evening.  Use insect repellent. The best insect repellents contain DEET, picaridin, oil of lemon eucalyptus (OLE), or IR3535.  Consider spraying your clothing with a pesticide called permethrin. Permethrin helps prevent insect bites. It works for several weeks and for up to 5-6 clothing washes. Do not apply permethrin directly to the skin.  If your home windows do not have screens, consider installing them.  If you will be sleeping in an area where there are mosquitoes, consider covering your sleeping area with a mosquito net. Contact a health care provider if:  You have redness, swelling, or pain in the bite area.  You have fluid or blood coming from the bite area.  The bite area feels warm to the touch.  You have pus or a bad smell coming from the bite area.  You have a fever. Get help right away if:  You have joint pain.  You have a rash.  You feel unusually tired or sleepy.  You have neck pain.  You have a headache.  You have unusual weakness.  You develop symptoms of an anaphylactic reaction. These may include: ? Flushed skin. ? Hives. ? Swelling of the eyes, lips, face, mouth, tongue, or throat. ? Difficulty  breathing, speaking, or swallowing. ? Wheezing. ? Dizziness or light-headedness. ? Fainting. ? Pain or cramping in the abdomen. ? Vomiting. ? Diarrhea. These symptoms may represent a serious problem that is an emergency. Do not wait to see if the symptoms will go away. Do the following right away:  Use the auto-injector pen as you have been instructed.  Get medical help. Call your local emergency services (911 in the [place] Do not drive yourself to the hospital. Summary  An insect bite can make your skin red, itchy, and swollen.  Treatment is usually not needed. Symptoms often go away on their own. When treatment is recommended, it may involve taking medicine, applying medicine to the area, or applying ice.  Apply or take over-the-counter and prescription medicines only as told by your health care provider.  Use insect repellent to help prevent insect bites.  Contact a health care provider if you have any signs of infection in the bite area. This information is not intended to  replace advice given to you by your health care provider. Make sure you discuss any questions you have with your health care provider. Document Released: 12/24/2004 Document Revised: 05/27/2018 Document Reviewed: 05/27/2018 Elsevier Patient Education  2020 [employer].

## 2019-07-26 ENCOUNTER — Other Ambulatory Visit: Payer: Self-pay | Admitting: Cardiovascular Disease

## 2019-09-02 ENCOUNTER — Emergency Department (HOSPITAL_BASED_OUTPATIENT_CLINIC_OR_DEPARTMENT_OTHER)
Admission: EM | Admit: 2019-09-02 | Discharge: 2019-09-02 | Disposition: A | Discharge status: Home / Self Care | Payer: BC Managed Care – PPO | Attending: Emergency Medicine | Admitting: Emergency Medicine

## 2019-09-02 ENCOUNTER — Encounter (HOSPITAL_BASED_OUTPATIENT_CLINIC_OR_DEPARTMENT_OTHER): Payer: Self-pay | Admitting: *Deleted

## 2019-09-02 ENCOUNTER — Other Ambulatory Visit: Payer: Self-pay

## 2019-09-02 DIAGNOSIS — I2510 Atherosclerotic heart disease of native coronary artery without angina pectoris: Secondary | ICD-10-CM | POA: Insufficient documentation

## 2019-09-02 DIAGNOSIS — Z79899 Other long term (current) drug therapy: Secondary | ICD-10-CM | POA: Insufficient documentation

## 2019-09-02 DIAGNOSIS — E119 Type 2 diabetes mellitus without complications: Secondary | ICD-10-CM | POA: Diagnosis not present

## 2019-09-02 DIAGNOSIS — R2231 Localized swelling, mass and lump, right upper limb: Secondary | ICD-10-CM | POA: Diagnosis present

## 2019-09-02 DIAGNOSIS — I252 Old myocardial infarction: Secondary | ICD-10-CM | POA: Insufficient documentation

## 2019-09-02 DIAGNOSIS — Z7982 Long term (current) use of aspirin: Secondary | ICD-10-CM | POA: Diagnosis not present

## 2019-09-02 DIAGNOSIS — I10 Essential (primary) hypertension: Secondary | ICD-10-CM | POA: Insufficient documentation

## 2019-09-02 DIAGNOSIS — L03113 Cellulitis of right upper limb: Secondary | ICD-10-CM | POA: Diagnosis not present

## 2019-09-02 DIAGNOSIS — Z7984 Long term (current) use of oral hypoglycemic drugs: Secondary | ICD-10-CM | POA: Diagnosis not present

## 2019-09-02 MED ORDER — LEVOFLOXACIN 750 MG PO TABS
750.0000 mg | ORAL_TABLET | Freq: Once | ORAL | Status: AC
  Administered 2019-09-02: 04:00:00 750 mg via ORAL
  Filled 2019-09-02: qty 1

## 2019-09-02 MED ORDER — LEVOFLOXACIN 750 MG PO TABS: 750.0000 mg | ORAL_TABLET | Freq: Every day | ORAL | 0 refills | Status: DC

## 2019-09-02 NOTE — ED Triage Notes (Signed)
Insect bite to right forearm and right upper arm.  Warmth, redness, swelling noted.

## 2019-09-02 NOTE — ED Provider Notes (Signed)
[facility] Provider Note: Marie Spurling, MD, FACEP  CSN: [id number] MRN: [id number] ARRIVAL: 09/02/19 at [place]: [place]  09/02/19 3:05 AM Marie Tanner is a 63 y.o. female with a history of lymphedema of the right upper extremity status post surgery for right breast cancer.  She believes she was bitten by insects on her right forearm and upper arm the evening before last.  She did not see any insects but awakened yesterday morning with pain, swelling and erythema of the right forearm and right upper arm.  She is having associated pain which she rates as a 9 out of 10, worse with movement.  She has not had a fever.   Past Medical History:  Diagnosis Date  . Breast cancer ([place])   . Cancer ([place])   . Coronary artery disease   . Depression   . DVT (deep venous thrombosis) ([place]) 11/15   left femerol artery injury during resusciation.  [name] GERD (gastroesophageal reflux disease)   . HCAP (healthcare-associated pneumonia) 11/22/2014  . Heart murmur   . High cholesterol   . History of blood transfusion 09/2014   related to MI  . History of gout   . Hypertension   . Liver laceration 10/19/14  . Pulmonary edema 10/19/14  . Renal disorder   . Sleep apnea    "suppose to wear mask but I don't" (11/22/2014)  . STEMI (ST elevation myocardial infarction) ([place]) 10/19/14  . Type II diabetes mellitus ([place])     Past Surgical History:  Procedure Laterality Date  . ABDOMINAL HYSTERECTOMY  ~ 2000  . APPLICATION OF WOUND VAC  10/2014   "over my naval" (11/22/2014)  . BREAST LUMPECTOMY WITH AXILLARY LYMPH NODE BIOPSY    . BREAST SURGERY    . [place]; 1982  . CORONARY ANGIOPLASTY WITH STENT PLACEMENT  09/2014   "multiple"/notes 11/22/2014  . EXTRACORPOREAL CIRCULATION    . Femerol artery repair Right 09/2014   pt insists it was a right femoral artery repair on 11/22/2014  . LAPAROSCOPIC  ABDOMINAL EXPLORATION    . LEFT HEART CATH AND CORONARY ANGIOGRAPHY N/A 12/07/2017   Procedure: LEFT HEART CATH AND CORONARY ANGIOGRAPHY;  Surgeon: Leonie Man, MD;  Location: [facility];  Service: Cardiovascular;  Laterality: N/A;  . LEFT VENTRICULAR ASSIST DEVICE     pt is not aware of this hx on 11/22/2014  . Liver laceration repair  09/2014   related to CPR/notes 11/22/2014  . TRACHEOSTOMY  09/2014   "closed on it's own when they took it out"  . TUBAL LIGATION  1982    Family History  Problem Relation Age of Onset  . Diabetes Mother   . Hypertension Mother   . COPD Mother   . Heart attack Father   . Alzheimer's disease Father   . Diabetes Brother   . Stroke Neg Hx   . Colon cancer Neg Hx   . Stomach cancer Neg Hx   . Pancreatic cancer Neg Hx   . AAA (abdominal aortic aneurysm) Neg Hx     Social History   Tobacco Use  . Smoking status: Never Smoker  . Smokeless tobacco: Never Used  Substance Use Topics  . Alcohol use: Yes    Comment: occ  . Drug use: No    Prior to Admission medications   Medication Sig Start Date End Date Taking? Authorizing Provider  acetaminophen (  TYLENOL) 500 MG tablet Take 1,000 mg by mouth 2 (two) times daily as needed (pain).     [provider]  Alirocumab (PRALUENT) 150 MG/ML SOAJ Inject 150 mg into the skin every 14 (fourteen) days. 07/07/19   Skeet Latch, MD  aspirin EC 81 MG tablet Take 1 tablet (81 mg total) by mouth daily. 04/08/15   Darlin Coco, MD  BIDIL 20-37.5 MG tablet TAKE 1 TABLET BY MOUTH TWICE A DAY 07/26/19   Skeet Latch, MD  blood glucose meter kit and supplies KIT by Does not apply route daily as needed. Dispense based on patient and insurance preference. Use up to four times daily as directed. (FOR ICD-9 250.00, 250.01).    [provider]  BRILINTA 60 MG TABS tablet TAKE 1 TABLET (60 MG TOTAL) BY MOUTH 2 (TWO) TIMES DAILY. 03/06/19   Skeet Latch, MD  exemestane (AROMASIN) 25 MG  tablet Take 25 mg by mouth daily after breakfast.    [provider]  ezetimibe (ZETIA) 10 MG tablet TAKE 1 TABLET BY MOUTH EVERY DAY 04/09/19   Skeet Latch, MD  furosemide (LASIX) 40 MG tablet TAKE 1 TABLET AS NEEDED FOR SHORTNESS OF BREATH OR WEIGHT GAIN OF 2 POUNDS IN 24 HOURS OR 5 POUNDS IN 1 WEEK 04/05/18   Skeet Latch, MD  glucose blood test strip Use as instructed. E11.5. To check blood glucose daily. 05/02/18   Forrest Moron, MD  levofloxacin (LEVAQUIN) 750 MG tablet Take 1 tablet (750 mg total) by mouth daily. X 7 days 09/02/19   Marlen Mollica, MD  loratadine (CLARITIN) 10 MG tablet Take 10 mg by mouth daily as needed for allergies.     [provider]  losartan (COZAAR) 25 MG tablet Take 25 mg by mouth as directed. TAKE 1 TABLET BY MOUTH DAILY    [provider]  magnesium oxide (MAG-OX) 400 MG tablet TAKE 1 TABLET BY MOUTH EVERY DAY 04/27/19   Forrest Moron, MD  metFORMIN (GLUCOPHAGE-XR) 500 MG 24 hr tablet Take 1 tablet (500 mg total) by mouth daily with breakfast. 04/03/19   Forrest Moron, MD  metoprolol succinate (TOPROL-XL) 25 MG 24 hr tablet Take 1 tablet (25 mg total) by mouth daily. 03/15/19   Almyra Deforest, PA  nitroGLYCERIN (NITROSTAT) 0.4 MG SL tablet Place 1 tablet (0.4 mg total) under the tongue every 5 (five) minutes as needed for chest pain. max of 3 if no relief call 911 11/16/17   Skeet Latch, MD  pantoprazole (PROTONIX) 40 MG tablet Take 1 tablet (40 mg total) by mouth daily. 02/09/18   Skeet Latch, MD  polyethylene glycol (MIRALAX / GLYCOLAX) 17 g packet Take 17 g by mouth daily.    [provider]  rosuvastatin (CRESTOR) 20 MG tablet Take 1/2 tablet by mouth 3 times per week 08/02/18   Skeet Latch, MD  spironolactone (ALDACTONE) 25 MG tablet Take 1 tablet (25 mg total) by mouth daily. 03/15/19   Almyra Deforest, PA  triamcinolone cream (KENALOG) 0.1 % Apply 1 application topically 2 (two) times daily. 07/18/19   Forrest Moron, MD  albuterol (PROVENTIL HFA;VENTOLIN HFA) 108 (90 Base) MCG/ACT inhaler Inhale 2 puffs into the lungs every 4 (four) hours as needed for wheezing or shortness of breath (cough, shortness of breath or wheezing.). Patient not taking: Reported on 06/08/2019 02/16/19 09/02/19  Forrest Moron, MD  SUMAtriptan (IMITREX) 25 MG tablet Take one tablet at the onset of headache. May repeat in 2  hours if headache persists or recurs. Patient not taking: Reported on 06/08/2019 12/02/17 09/02/19  Forrest Moron, MD    Allergies Dilaudid [hydromorphone hcl], Hydromorphone, Epinephrine, and Lactose intolerance (gi)   REVIEW OF SYSTEMS  Negative except as noted here or in the History of Present Illness.   PHYSICAL EXAMINATION  Initial Vital Signs Blood pressure 131/70, pulse 100, temperature 98.4 F (36.9 C), temperature source Oral, resp. rate 20, height _0  (1.575 m), weight 88.9 kg, SpO2 100 %.  Examination General: Well-developed, well-nourished female in no acute distress; appearance consistent with age of record HENT: normocephalic; atraumatic Eyes: pupils equal, round and reactive to light; extraocular muscles intact Neck: supple Heart: regular rate and rhythm Lungs: clear to auscultation bilaterally Abdomen: soft; nondistended; nontender; bowel sounds present Extremities: No deformity; full range of motion; pulses normal; mild lymphedema of right upper arm with erythema, tenderness and warmth of the right upper arm and right forearm:      Neurologic: Awake, alert and oriented; motor function intact in all extremities and symmetric; no facial droop Skin: Warm and dry Psychiatric: Normal mood and affect   RESULTS  Summary of this visit's results, reviewed by myself:   EKG Interpretation  Date/Time:    Ventricular Rate:    PR Interval:    QRS Duration:   QT Interval:    QTC Calculation:   R Axis:     Text Interpretation:        Laboratory Studies: No results found for  this or any previous visit (from the past 24 hour(s)). Imaging Studies: No results found.  ED COURSE and MDM  Nursing notes and initial vitals signs, including pulse oximetry, reviewed.  Vitals:   09/02/19 0258  BP: 131/70  Pulse: 100  Resp: 20  Temp: 98.4 F (36.9 C)  TempSrc: Oral  SpO2: 100%  Weight: 88.9 kg  Height: _1  (1.575 m)   As the patient did not actually see an insect bite her arm it is unclear if she was bitten.  Whether initiated by insect bites or spontaneously this appears to be cellulitis.  Given her lymphedema and diabetes we will treat aggressively with levofloxacin and have her follow-up with her primary care physician, Dr. Nolon Rod.   PROCEDURES    ED DIAGNOSES     ICD-10-CM   1. Cellulitis of right upper extremity  L03.[place]        Miley Blanchett, MD 09/02/19 [phone]

## 2019-09-04 ENCOUNTER — Ambulatory Visit: Payer: BC Managed Care – PPO | Admitting: Family Medicine

## 2019-09-04 ENCOUNTER — Other Ambulatory Visit: Payer: Self-pay

## 2019-09-04 ENCOUNTER — Encounter: Payer: Self-pay | Admitting: Family Medicine

## 2019-09-04 VITALS — BP 129/82 | HR 81 | Temp 98.5°F | Resp 17 | Ht 62.0 in | Wt 195.6 lb

## 2019-09-04 DIAGNOSIS — S50361A Insect bite (nonvenomous) of right elbow, initial encounter: Secondary | ICD-10-CM

## 2019-09-04 DIAGNOSIS — W57XXXA Bitten or stung by nonvenomous insect and other nonvenomous arthropods, initial encounter: Secondary | ICD-10-CM | POA: Diagnosis not present

## 2019-09-04 DIAGNOSIS — L03113 Cellulitis of right upper limb: Secondary | ICD-10-CM | POA: Diagnosis not present

## 2019-09-04 MED ORDER — FLUCONAZOLE 150 MG PO TABS: 150.0000 mg | ORAL_TABLET | Freq: Every day | ORAL | 0 refills | Status: DC

## 2019-09-04 MED ORDER — TRIAMCINOLONE ACETONIDE 0.1 % EX CREA: 1.0000 "application " | TOPICAL_CREAM | Freq: Two times a day (BID) | CUTANEOUS | 3 refills | Status: DC

## 2019-09-04 NOTE — Patient Instructions (Signed)
° ° ° °  If you have lab work done today you will be contacted with your lab results within the next 2 weeks.  If you have not heard from us then please contact us. The fastest way to get your results is to register for My Chart. ° ° °IF you received an x-ray today, you will receive an invoice from [facility]. Please contact [facility] at [phone] with questions or concerns regarding your invoice.  ° °IF you received labwork today, you will receive an invoice from [facility]. Please contact [facility] at [phone] with questions or concerns regarding your invoice.  ° °Our billing staff will not be able to assist you with questions regarding bills from these companies. ° °You will be contacted with the lab results as soon as they are available. The fastest way to get your results is to activate your My Chart account. Instructions are located on the last page of this paperwork. If you have not heard from us regarding the results in 2 weeks, please contact this office. °  ° ° ° °

## 2019-09-04 NOTE — Progress Notes (Signed)
Established Patient Office Visit  Subjective:  Patient ID: Marie Tanner, female    DOB: October 24, 1956  Age: 63 y.o. MRN: [id number]  CC:  Chief Complaint  Patient presents with  . Insect Bite    x friday,started as a little bump, per pt thought it was a mosquito bite but got bigger as the day went on    and spread into hand, b it e on upper arm as well. P  t seen in ED for the bite  and given antibiotic. Per pt she is having rsevere itching, and on Saturnday pt felt like her throat was closing up on her.     HPI Marie Tanner presents for   Pt went to the ER for a bite from a possible insect that has gotten infected It lead to erythema and severe itching She felt like her throat was closing   Past Medical History:  Diagnosis Date  . Breast cancer ([place])   . Cancer ([place])   . Coronary artery disease   . Depression   . DVT (deep venous thrombosis) ([place]) 11/15   left femerol artery injury during resusciation.  [name] GERD (gastroesophageal reflux disease)   . HCAP (healthcare-associated pneumonia) 11/22/2014  . Heart murmur   . High cholesterol   . History of blood transfusion 09/2014   related to MI  . History of gout   . Hypertension   . Liver laceration 10/19/14  . Pulmonary edema 10/19/14  . Renal disorder   . Sleep apnea    "suppose to wear mask but I don't" (11/22/2014)  . STEMI (ST elevation myocardial infarction) ([place]) 10/19/14  . Type II diabetes mellitus ([place])     Past Surgical History:  Procedure Laterality Date  . ABDOMINAL HYSTERECTOMY  ~ 2000  . APPLICATION OF WOUND VAC  10/2014   "over my naval" (11/22/2014)  . BREAST LUMPECTOMY WITH AXILLARY LYMPH NODE BIOPSY    . BREAST SURGERY    . [place]; 1982  . CORONARY ANGIOPLASTY WITH STENT PLACEMENT  09/2014   "multiple"/notes 11/22/2014  . EXTRACORPOREAL CIRCULATION    . Femerol artery repair Right 09/2014   pt insists it was a right femoral artery repair on 11/22/2014  . LAPAROSCOPIC  ABDOMINAL EXPLORATION    . LEFT HEART CATH AND CORONARY ANGIOGRAPHY N/A 12/07/2017   Procedure: LEFT HEART CATH AND CORONARY ANGIOGRAPHY;  Surgeon: Leonie Man, MD;  Location: [facility];  Service: Cardiovascular;  Laterality: N/A;  . LEFT VENTRICULAR ASSIST DEVICE     pt is not aware of this hx on 11/22/2014  . Liver laceration repair  09/2014   related to CPR/notes 11/22/2014  . TRACHEOSTOMY  09/2014   "closed on it's own when they took it out"  . TUBAL LIGATION  1982    Family History  Problem Relation Age of Onset  . Diabetes Mother   . Hypertension Mother   . COPD Mother   . Heart attack Father   . Alzheimer's disease Father   . Diabetes Brother   . Stroke Neg Hx   . Colon cancer Neg Hx   . Stomach cancer Neg Hx   . Pancreatic cancer Neg Hx   . AAA (abdominal aortic aneurysm) Neg Hx     Social History   Socioeconomic History  . Marital status: Divorced    Spouse name: Not on file  . Number of children: 2  . Years of education: Not on file  . Highest education  level: Not on file  Occupational History  . Not on file  Social Needs  . Financial resource strain: Not on file  . Food insecurity    Worry: Not on file    Inability: Not on file  . Transportation needs    Medical: Not on file    Non-medical: Not on file  Tobacco Use  . Smoking status: Never Smoker  . Smokeless tobacco: Never Used  Substance and Sexual Activity  . Alcohol use: Yes    Comment: occ  . Drug use: No  . Sexual activity: Not Currently  Lifestyle  . Physical activity    Days per week: Not on file    Minutes per session: Not on file  . Stress: Not on file  Relationships  . Social Herbalist on phone: Not on file    Gets together: Not on file    Attends religious service: Not on file    Active member of club or organization: Not on file    Attends meetings of clubs or organizations: Not on file    Relationship status: Not on file  . Intimate partner violence     Fear of current or ex partner: Not on file    Emotionally abused: Not on file    Physically abused: Not on file    Forced sexual activity: Not on file  Other Topics Concern  . Not on file  Social History Narrative  . Not on file    Outpatient Medications Prior to Visit  Medication Sig Dispense Refill  . acetaminophen (TYLENOL) 500 MG tablet Take 1,000 mg by mouth 2 (two) times daily as needed (pain).     . Alirocumab (PRALUENT) 150 MG/ML SOAJ Inject 150 mg into the skin every 14 (fourteen) days. 2 pen 12  . aspirin EC 81 MG tablet Take 1 tablet (81 mg total) by mouth daily.    [name] BIDIL 20-37.5 MG tablet TAKE 1 TABLET BY MOUTH TWICE A DAY 60 tablet 5  . blood glucose meter kit and supplies KIT by Does not apply route daily as needed. Dispense based on patient and insurance preference. Use up to four times daily as directed. (FOR ICD-9 250.00, 250.01).    . BRILINTA 60 MG TABS tablet TAKE 1 TABLET (60 MG TOTAL) BY MOUTH 2 (TWO) TIMES DAILY. 180 tablet 2  . exemestane (AROMASIN) 25 MG tablet Take 25 mg by mouth daily after breakfast.    . ezetimibe (ZETIA) 10 MG tablet TAKE 1 TABLET BY MOUTH EVERY DAY 30 tablet 5  . furosemide (LASIX) 40 MG tablet TAKE 1 TABLET AS NEEDED FOR SHORTNESS OF BREATH OR WEIGHT GAIN OF 2 POUNDS IN 24 HOURS OR 5 POUNDS IN 1 WEEK 30 tablet 1  . glucose blood test strip Use as instructed. E11.5. To check blood glucose daily. 100 each 12  . levofloxacin (LEVAQUIN) 750 MG tablet Take 1 tablet (750 mg total) by mouth daily. X 7 days 7 tablet 0  . loratadine (CLARITIN) 10 MG tablet Take 10 mg by mouth daily as needed for allergies.     [name] losartan (COZAAR) 25 MG tablet Take 25 mg by mouth as directed. TAKE 1 TABLET BY MOUTH DAILY    . magnesium oxide (MAG-OX) 400 MG tablet TAKE 1 TABLET BY MOUTH EVERY DAY 90 tablet 0  . metFORMIN (GLUCOPHAGE-XR) 500 MG 24 hr tablet Take 1 tablet (500 mg total) by mouth daily with breakfast. 90 tablet 1  . metoprolol succinate (  TOPROL-XL) 25  MG 24 hr tablet Take 1 tablet (25 mg total) by mouth daily. 30 tablet 11  . nitroGLYCERIN (NITROSTAT) 0.4 MG SL tablet Place 1 tablet (0.4 mg total) under the tongue every 5 (five) minutes as needed for chest pain. max of 3 if no relief call 911 25 tablet PRN  . pantoprazole (PROTONIX) 40 MG tablet Take 1 tablet (40 mg total) by mouth daily. 30 tablet 11  . polyethylene glycol (MIRALAX / GLYCOLAX) 17 g packet Take 17 g by mouth daily.    . rosuvastatin (CRESTOR) 20 MG tablet Take 1/2 tablet by mouth 3 times per week 30 tablet 2  . spironolactone (ALDACTONE) 25 MG tablet Take 1 tablet (25 mg total) by mouth daily. 30 tablet 5  . triamcinolone cream (KENALOG) 0.1 % Apply 1 application topically 2 (two) times daily. 45 g 0   No facility-administered medications prior to visit.     Allergies  Allergen Reactions  . Dilaudid [Hydromorphone Hcl] Anaphylaxis, Itching and Swelling  . Hydromorphone Anaphylaxis, Itching and Swelling  . Epinephrine Anxiety and Other (See Comments)    Severe anxiety  . Lactose Intolerance (Gi) Diarrhea    ROS Review of Systems    Objective:    Physical Exam  BP 129/82 (BP Location: Right Arm, Patient Position: Sitting, Cuff Size: Large)   Pulse 81   Temp 98.5 F (36.9 C) (Oral)   Resp 17   Ht 5' 2"  (1.575 m)   Wt 195 lb 9.6 oz (88.7 kg)   SpO2 99%   BMI 35.78 kg/m  Wt Readings from Last 3 Encounters:  09/04/19 195 lb 9.6 oz (88.7 kg)  09/02/19 196 lb (88.9 kg)  07/18/19 195 lb 12.8 oz (88.8 kg)  Physical Exam  Constitutional: Oriented to person, place, and time. Appears well-developed and well-nourished.  HENT:  Head: Normocephalic and atraumatic.  Eyes: Conjunctivae and EOM are normal.  Cardiovascular: +heart murmur Pulmonary/Chest: Effort normal and breath sounds normal. No stridor. No respiratory distress. Has no wheezes.  Neurological: Is alert and oriented to person, place, and time.  Skin: Skin is warm. Capillary refill takes less than 2  seconds.  Psychiatric: Has a normal mood and affect. Behavior is normal. Judgment and thought content normal.   Right arm with edema, visible blister, no erythema or streaking Cap refill <2s  Health Maintenance Due  Topic Date Due  . Hepatitis C Screening  1956-06-08  . OPHTHALMOLOGY EXAM  08/03/1968  . HIV Screening  08/03/1973  . FOOT EXAM  05/03/2019  . INFLUENZA VACCINE  07/01/2019    There are no preventive care reminders to display for this patient.  Lab Results  Component Value Date   TSH 2.560 10/28/2017   Lab Results  Component Value Date   WBC 3.1 (L) 12/03/2017   HGB 12.1 12/03/2017   HCT 35.6 12/03/2017   MCV 82 12/03/2017   PLT 258 12/03/2017   Lab Results  Component Value Date   NA 137 04/25/2019   K 5.0 04/25/2019   CO2 23 04/25/2019   GLUCOSE 111 (H) 04/25/2019   BUN 14 04/25/2019   CREATININE 0.69 04/25/2019   BILITOT 0.5 04/25/2019   ALKPHOS 96 04/25/2019   AST 31 04/25/2019   ALT 23 04/25/2019   PROT 7.8 04/25/2019   ALBUMIN 4.4 04/25/2019   CALCIUM 9.8 04/25/2019   ANIONGAP 10 04/10/2016   Lab Results  Component Value Date   CHOL 101 12/20/2018   Lab Results  Component Value  Date   HDL 56 12/20/2018   Lab Results  Component Value Date   LDLCALC 36 12/20/2018   Lab Results  Component Value Date   TRIG 43 12/20/2018   Lab Results  Component Value Date   CHOLHDL 1.8 12/20/2018   Lab Results  Component Value Date   HGBA1C 6.8 (A) 07/18/2019      Assessment & Plan:   Problem List Items Addressed This Visit    None    Visit Diagnoses    Cellulitis of right upper extremity    -  Primary   Relevant Medications   fluconazole (DIFLUCAN) 150 MG tablet   Insect bite of right elbow, initial encounter       Relevant Medications   triamcinolone cream (KENALOG) 0.1 %     Reviewed ER notes and images Today cellulitis is improving Edema is still present Pt advised to keep arm elevated Refilled triamcinolone for itching  Diflucan prn yeast vaginitis after levaquin Meds ordered this encounter  Medications  . fluconazole (DIFLUCAN) 150 MG tablet    Sig: Take 1 tablet (150 mg total) by mouth daily. Take second dose in 3 days    Dispense:  2 tablet    Refill:  0  . triamcinolone cream (KENALOG) 0.1 %    Sig: Apply 1 application topically 2 (two) times daily.    Dispense:  80 g    Refill:  3    Follow-up: No follow-ups on file.    Forrest Moron, MD

## 2019-09-08 ENCOUNTER — Ambulatory Visit: Payer: BC Managed Care – PPO | Admitting: Family Medicine

## 2019-09-08 ENCOUNTER — Other Ambulatory Visit: Payer: Self-pay

## 2019-09-08 ENCOUNTER — Encounter: Payer: Self-pay | Admitting: Family Medicine

## 2019-09-08 VITALS — BP 133/82 | HR 79 | Temp 98.4°F | Resp 16 | Ht 62.0 in | Wt 195.4 lb

## 2019-09-08 DIAGNOSIS — W57XXXA Bitten or stung by nonvenomous insect and other nonvenomous arthropods, initial encounter: Secondary | ICD-10-CM

## 2019-09-08 DIAGNOSIS — W57XXXS Bitten or stung by nonvenomous insect and other nonvenomous arthropods, sequela: Secondary | ICD-10-CM

## 2019-09-08 DIAGNOSIS — S50361S Insect bite (nonvenomous) of right elbow, sequela: Secondary | ICD-10-CM

## 2019-09-08 DIAGNOSIS — R229 Localized swelling, mass and lump, unspecified: Secondary | ICD-10-CM

## 2019-09-08 DIAGNOSIS — S40861A Insect bite (nonvenomous) of right upper arm, initial encounter: Secondary | ICD-10-CM | POA: Diagnosis not present

## 2019-09-08 MED ORDER — MONTELUKAST SODIUM 10 MG PO TABS: 10.0000 mg | ORAL_TABLET | Freq: Every day | ORAL | 3 refills | Status: DC

## 2019-09-08 NOTE — Progress Notes (Signed)
Established Patient Office Visit  Subjective:  Patient ID: Marie Tanner, female    DOB: 09/07/1956  Age: 63 y.o. MRN: [id number]  CC:  Chief Complaint  Patient presents with  . arm issue    recheck her right arm; states she had another bite; still on the antibiotic    HPI Marie Tanner presents for   Patient was having right arm edema and resolving localized reaction to insect bite This is a close follow up to reassess the patient's range of motion and to ensure that she does not continue to have a reaction to the possible insect bite.   She is still on the antibiotic levaquin and had 3 more days left No fevers or chills She is still having a rash Her reaction in the lower forearm is improved but she reports 2 new bites in the upper arm over the triceps She has itching nodules  She had an exterminator come to the house but he did not see any visible bugs or insects She is allergic to dust mites   Past Medical History:  Diagnosis Date  . Breast cancer ([place])   . Cancer ([place])   . Coronary artery disease   . Depression   . DVT (deep venous thrombosis) ([place]) 11/15   left femerol artery injury during resusciation.  [name] GERD (gastroesophageal reflux disease)   . HCAP (healthcare-associated pneumonia) 11/22/2014  . Heart murmur   . High cholesterol   . History of blood transfusion 09/2014   related to MI  . History of gout   . Hypertension   . Liver laceration 10/19/14  . Pulmonary edema 10/19/14  . Renal disorder   . Sleep apnea    "suppose to wear mask but I don't" (11/22/2014)  . STEMI (ST elevation myocardial infarction) ([place]) 10/19/14  . Type II diabetes mellitus ([place])     Past Surgical History:  Procedure Laterality Date  . ABDOMINAL HYSTERECTOMY  ~ 2000  . APPLICATION OF WOUND VAC  10/2014   "over my naval" (11/22/2014)  . BREAST LUMPECTOMY WITH AXILLARY LYMPH NODE BIOPSY    . BREAST SURGERY    . [place]; 1982  . CORONARY ANGIOPLASTY WITH  STENT PLACEMENT  09/2014   "multiple"/notes 11/22/2014  . EXTRACORPOREAL CIRCULATION    . Femerol artery repair Right 09/2014   pt insists it was a right femoral artery repair on 11/22/2014  . LAPAROSCOPIC ABDOMINAL EXPLORATION    . LEFT HEART CATH AND CORONARY ANGIOGRAPHY N/A 12/07/2017   Procedure: LEFT HEART CATH AND CORONARY ANGIOGRAPHY;  Surgeon: Leonie Man, MD;  Location: [facility];  Service: Cardiovascular;  Laterality: N/A;  . LEFT VENTRICULAR ASSIST DEVICE     pt is not aware of this hx on 11/22/2014  . Liver laceration repair  09/2014   related to CPR/notes 11/22/2014  . TRACHEOSTOMY  09/2014   "closed on it's own when they took it out"  . TUBAL LIGATION  1982    Family History  Problem Relation Age of Onset  . Diabetes Mother   . Hypertension Mother   . COPD Mother   . Heart attack Father   . Alzheimer's disease Father   . Diabetes Brother   . Stroke Neg Hx   . Colon cancer Neg Hx   . Stomach cancer Neg Hx   . Pancreatic cancer Neg Hx   . AAA (abdominal aortic aneurysm) Neg Hx     Social History   Socioeconomic History  .  Marital status: Divorced    Spouse name: Not on file  . Number of children: 2  . Years of education: Not on file  . Highest education level: Not on file  Occupational History  . Not on file  Social Needs  . Financial resource strain: Not on file  . Food insecurity    Worry: Not on file    Inability: Not on file  . Transportation needs    Medical: Not on file    Non-medical: Not on file  Tobacco Use  . Smoking status: Never Smoker  . Smokeless tobacco: Never Used  Substance and Sexual Activity  . Alcohol use: Yes    Comment: occ  . Drug use: No  . Sexual activity: Not Currently  Lifestyle  . Physical activity    Days per week: Not on file    Minutes per session: Not on file  . Stress: Not on file  Relationships  . Social Herbalist on phone: Not on file    Gets together: Not on file    Attends  religious service: Not on file    Active member of club or organization: Not on file    Attends meetings of clubs or organizations: Not on file    Relationship status: Not on file  . Intimate partner violence    Fear of current or ex partner: Not on file    Emotionally abused: Not on file    Physically abused: Not on file    Forced sexual activity: Not on file  Other Topics Concern  . Not on file  Social History Narrative  . Not on file    Outpatient Medications Prior to Visit  Medication Sig Dispense Refill  . acetaminophen (TYLENOL) 500 MG tablet Take 1,000 mg by mouth every 6 (six) hours as needed.    . Alirocumab (PRALUENT) 150 MG/ML SOAJ Inject 150 mg into the skin every 14 (fourteen) days. 2 pen 12  . aspirin EC 81 MG tablet Take 1 tablet (81 mg total) by mouth daily.    [name] BIDIL 20-37.5 MG tablet TAKE 1 TABLET BY MOUTH TWICE A DAY 60 tablet 5  . blood glucose meter kit and supplies KIT by Does not apply route daily as needed. Dispense based on patient and insurance preference. Use up to four times daily as directed. (FOR ICD-9 250.00, 250.01).    . BRILINTA 60 MG TABS tablet TAKE 1 TABLET (60 MG TOTAL) BY MOUTH 2 (TWO) TIMES DAILY. 180 tablet 2  . exemestane (AROMASIN) 25 MG tablet Take 25 mg by mouth daily after breakfast.    . exemestane (AROMASIN) 25 MG tablet Take by mouth.    . ezetimibe (ZETIA) 10 MG tablet TAKE 1 TABLET BY MOUTH EVERY DAY 30 tablet 5  . fluconazole (DIFLUCAN) 150 MG tablet Take 1 tablet (150 mg total) by mouth daily. Take second dose in 3 days 2 tablet 0  . furosemide (LASIX) 40 MG tablet TAKE 1 TABLET AS NEEDED FOR SHORTNESS OF BREATH OR WEIGHT GAIN OF 2 POUNDS IN 24 HOURS OR 5 POUNDS IN 1 WEEK 30 tablet 1  . glucose blood test strip Use as instructed. E11.5. To check blood glucose daily. 100 each 12  . levofloxacin (LEVAQUIN) 750 MG tablet Take 1 tablet (750 mg total) by mouth daily. X 7 days 7 tablet 0  . loratadine (CLARITIN) 10 MG tablet Take 10 mg by  mouth daily as needed for allergies.     [name]  losartan (COZAAR) 25 MG tablet Take 25 mg by mouth as directed. TAKE 1 TABLET BY MOUTH DAILY    . magnesium oxide (MAG-OX) 400 MG tablet TAKE 1 TABLET BY MOUTH EVERY DAY 90 tablet 0  . metFORMIN (GLUCOPHAGE-XR) 500 MG 24 hr tablet Take 1 tablet (500 mg total) by mouth daily with breakfast. 90 tablet 1  . metoprolol succinate (TOPROL-XL) 25 MG 24 hr tablet Take 1 tablet (25 mg total) by mouth daily. 30 tablet 11  . nitroGLYCERIN (NITROSTAT) 0.4 MG SL tablet Place 1 tablet (0.4 mg total) under the tongue every 5 (five) minutes as needed for chest pain. max of 3 if no relief call 911 25 tablet PRN  . pantoprazole (PROTONIX) 40 MG tablet Take 1 tablet (40 mg total) by mouth daily. 30 tablet 11  . polyethylene glycol (MIRALAX / GLYCOLAX) 17 g packet Take 17 g by mouth daily.    . rosuvastatin (CRESTOR) 20 MG tablet Take 1/2 tablet by mouth 3 times per week 30 tablet 2  . spironolactone (ALDACTONE) 25 MG tablet Take 1 tablet (25 mg total) by mouth daily. 30 tablet 5  . triamcinolone cream (KENALOG) 0.1 % Apply 1 application topically 2 (two) times daily. 80 g 3  . acetaminophen (TYLENOL) 500 MG tablet Take 1,000 mg by mouth 2 (two) times daily as needed (pain).      No facility-administered medications prior to visit.     Allergies  Allergen Reactions  . Dilaudid [Hydromorphone Hcl] Anaphylaxis, Itching and Swelling  . Hydromorphone Anaphylaxis, Itching and Swelling  . Epinephrine Anxiety and Other (See Comments)    Severe anxiety  . Lactose Intolerance (Gi) Diarrhea    ROS Review of Systems Review of Systems  Constitutional: Negative for activity change, appetite change, chills and fever.  HENT: Negative for congestion, nosebleeds, trouble swallowing and voice change.   Respiratory: Negative for cough, shortness of breath and wheezing.   Gastrointestinal: Negative for diarrhea, nausea and vomiting.  Genitourinary: Negative for difficulty  urinating, dysuria, flank pain and hematuria.  Musculoskeletal: Negative for back pain, joint swelling and neck pain.  Neurological: Negative for dizziness, speech difficulty, light-headedness and numbness.  See HPI. All other review of systems negative.     Objective:    Physical Exam  BP 133/82   Pulse 79   Temp 98.4 F (36.9 C) (Oral)   Resp 16   Ht _0  (1.575 m)   Wt 195 lb 6.4 oz (88.6 kg)   SpO2 99%   BMI 35.74 kg/m  Wt Readings from Last 3 Encounters:  09/08/19 195 lb 6.4 oz (88.6 kg)  09/04/19 195 lb 9.6 oz (88.7 kg)  09/02/19 196 lb (88.9 kg)    Physical Exam  Constitutional: Oriented to person, place, and time. Appears well-developed and well-nourished.  HENT:  Head: Normocephalic and atraumatic.  Eyes: Conjunctivae and EOM are normal.  Cardiovascular: Normal rate, regular rhythm, normal heart sounds and intact distal pulses.  No murmur heard. Pulmonary/Chest: Effort normal and breath sounds normal. No stridor. No respiratory distress. Has no wheezes.  Neurological: Is alert and oriented to person, place, and time.  Skin: Skin is warm. Capillary refill takes less than 2 seconds. Nodules palpable in the right upper arm without blistering. Nodules very small ~59m diameter Psychiatric: Has a normal mood and affect. Behavior is normal. Judgment and thought content normal.    Health Maintenance Due  Topic Date Due  . Hepatitis C Screening  01959-02-06 . OPHTHALMOLOGY EXAM  08/03/1968  . HIV Screening  08/03/1973  . FOOT EXAM  05/03/2019    There are no preventive care reminders to display for this patient.  Lab Results  Component Value Date   TSH 2.560 10/28/2017   Lab Results  Component Value Date   WBC 3.1 (L) 12/03/2017   HGB 12.1 12/03/2017   HCT 35.6 12/03/2017   MCV 82 12/03/2017   PLT 258 12/03/2017   Lab Results  Component Value Date   NA 137 04/25/2019   K 5.0 04/25/2019   CO2 23 04/25/2019   GLUCOSE 111 (H) 04/25/2019   BUN 14  04/25/2019   CREATININE 0.69 04/25/2019   BILITOT 0.5 04/25/2019   ALKPHOS 96 04/25/2019   AST 31 04/25/2019   ALT 23 04/25/2019   PROT 7.8 04/25/2019   ALBUMIN 4.4 04/25/2019   CALCIUM 9.8 04/25/2019   ANIONGAP 10 04/10/2016   Lab Results  Component Value Date   CHOL 101 12/20/2018   Lab Results  Component Value Date   HDL 56 12/20/2018   Lab Results  Component Value Date   LDLCALC 36 12/20/2018   Lab Results  Component Value Date   TRIG 43 12/20/2018   Lab Results  Component Value Date   CHOLHDL 1.8 12/20/2018   Lab Results  Component Value Date   HGBA1C 6.8 (A) 07/18/2019      Assessment & Plan:   Problem List Items Addressed This Visit    None    Visit Diagnoses    Insect bite of right upper arm with local reaction, initial encounter    -  Primary   Relevant Orders   Ambulatory referral to Allergy   Insect bite of right elbow, sequela       Relevant Orders   Ambulatory referral to Allergy   Multiple skin nodules       Relevant Orders   Ambulatory referral to Allergy     Advised pt to take singulair and to see Allergist Also advised NEEM OIL to use at home as a miticide and insecticide There are formulations that are safe for the skin  She will finish her antibiotic Home care reviewed   Meds ordered this encounter  Medications  . montelukast (SINGULAIR) 10 MG tablet    Sig: Take 1 tablet (10 mg total) by mouth at bedtime.    Dispense:  90 tablet    Refill:  3    Follow-up: No follow-ups on file.    Forrest Moron, MD

## 2019-09-08 NOTE — Patient Instructions (Addendum)
Try spraying the corners of the bed, the couch and the entrance to the home with Neem Oil Extract You can also pick up Neem Oil at [employer] or any health food store. You can apply a small amount of the oil to the wrist and elbow.  If you do not have a rash from the oil then  You can use it more generously.    If you have lab work done today you will be contacted with your lab results within the next 2 weeks.  If you have not heard from [place] then please contact us. The fastest way to get your results is to register for My Chart.   IF you received an x-ray today, you will receive an invoice from [facility]. Please contact [facility] at [phone] with questions or concerns regarding your invoice.   IF you received labwork today, you will receive an invoice from [place]. Please contact [facility] at [phone] with questions or concerns regarding your invoice.   Our billing staff will not be able to assist you with questions regarding bills from these companies.  You will be contacted with the lab results as soon as they are available. The fastest way to get your results is to activate your My Chart account. Instructions are located on the last page of this paperwork. If you have not heard from [place] regarding the results in 2 weeks, please contact this office.      Insect Bite, Adult An insect bite can make your skin red, itchy, and swollen. An insect bite is different from an insect sting, which happens when an insect injects poison (venom) into the skin. Some insects can spread disease to people through a bite. However, most insect bites do not lead to disease and are not serious. What are the causes? Insects may bite for a variety of reasons, including:  Hunger.  To defend themselves. Insects that bite include:  Spiders.  Mosquitoes.  Ticks.  Fleas.  Ants.  Flies.  Kissing bugs.  Chiggers. What are the signs or symptoms? Symptoms of this  condition include:  Itching or pain in the bite area.  Redness and swelling in the bite area.  An open wound (skin ulcer). In many cases, symptoms last for 2-4 days. In rare cases, a person may have a severe allergic reaction (anaphylactic reaction) to a bite. Symptoms of an anaphylactic reaction may include:  Feeling warm in the face (flushed). This may include redness.  Itchy, red, swollen areas of skin (hives).  Swelling of the eyes, lips, face, mouth, tongue, or throat.  Difficulty breathing, speaking, or swallowing.  Noisy breathing (wheezing).  Dizziness or light-headedness.  Fainting.  Pain or cramping in the abdomen.  Vomiting.  Diarrhea. How is this diagnosed? This condition is usually diagnosed based on symptoms and a physical exam. How is this treated? Treatment is usually not needed. Symptoms often go away on their own. When treatment is recommended, it may involve:  Applying a cream or lotion to the bite area. This treatment helps with itching.  Taking an antibiotic medicine. This treatment is needed if the bite area gets infected.  Getting a tetanus shot, if you are not up to date on this vaccine.  Applying ice to the affected area.  Allergy medicines called antihistamines. This treatment may be needed if you develop itching or an allergic reaction to the insect bite.  Giving yourself an epinephrine injection if you have an anaphylactic reaction to a bite. To give  the injection, you will use what is commonly called an auto-injector "pen" (pre-filled automatic epinephrine injection device). Your health care provider will teach you how to use an auto-injector pen. Follow these instructions at home: Bite area care   Do not scratch the bite area.  Keep the bite area clean and dry. Wash it every day with soap and water as told by your health care provider.  Check the bite area every day for signs of infection. Check for: ? Redness, swelling, or  pain. ? Fluid or blood. ? Warmth. ? Pus or a bad smell. Managing pain, itching, and swelling   You may apply cortisone cream, calamine lotion, or a paste made of baking soda and water to the bite area as told by your health care provider.  If directed, put ice on the bite area. ? Put ice in a plastic bag. ? Place a towel between your skin and the bag. ? Leave the ice on for 20 minutes, 2-3 times a day. General instructions  Apply or take over-the-counter and prescription medicines only as told by your health care provider.  If you were prescribed an antibiotic medicine, take or apply it as told by your health care provider. Do not stop using the antibiotic even if your condition improves.  Keep all follow-up visits as told by your health care provider. This is important. How is this prevented? To help reduce your risk of insect bites:  When you are outdoors, wear clothing that covers your arms and legs. This is especially important in the early morning and evening.  Use insect repellent. The best insect repellents contain DEET, picaridin, oil of lemon eucalyptus (OLE), or IR3535.  Consider spraying your clothing with a pesticide called permethrin. Permethrin helps prevent insect bites. It works for several weeks and for up to 5-6 clothing washes. Do not apply permethrin directly to the skin.  If your home windows do not have screens, consider installing them.  If you will be sleeping in an area where there are mosquitoes, consider covering your sleeping area with a mosquito net. Contact a health care provider if:  You have redness, swelling, or pain in the bite area.  You have fluid or blood coming from the bite area.  The bite area feels warm to the touch.  You have pus or a bad smell coming from the bite area.  You have a fever. Get help right away if:  You have joint pain.  You have a rash.  You feel unusually tired or sleepy.  You have neck pain.  You have a  headache.  You have unusual weakness.  You develop symptoms of an anaphylactic reaction. These may include: ? Flushed skin. ? Hives. ? Swelling of the eyes, lips, face, mouth, tongue, or throat. ? Difficulty breathing, speaking, or swallowing. ? Wheezing. ? Dizziness or light-headedness. ? Fainting. ? Pain or cramping in the abdomen. ? Vomiting. ? Diarrhea. These symptoms may represent a serious problem that is an emergency. Do not wait to see if the symptoms will go away. Do the following right away:  Use the auto-injector pen as you have been instructed.  Get medical help. Call your local emergency services (911 in the [place] Do not drive yourself to the hospital. Summary  An insect bite can make your skin red, itchy, and swollen.  Treatment is usually not needed. Symptoms often go away on their own. When treatment is recommended, it may involve taking medicine, applying medicine to the area, or  applying ice.  Apply or take over-the-counter and prescription medicines only as told by your health care provider.  Use insect repellent to help prevent insect bites.  Contact a health care provider if you have any signs of infection in the bite area. This information is not intended to replace advice given to you by your health care provider. Make sure you discuss any questions you have with your health care provider. Document Released: 12/24/2004 Document Revised: 05/27/2018 Document Reviewed: 05/27/2018 Elsevier Patient Education  2020 [employer].

## 2019-09-19 ENCOUNTER — Ambulatory Visit (INDEPENDENT_AMBULATORY_CARE_PROVIDER_SITE_OTHER): Payer: BC Managed Care – PPO | Admitting: Pediatrics

## 2019-09-19 ENCOUNTER — Encounter: Payer: Self-pay | Admitting: Pediatrics

## 2019-09-19 ENCOUNTER — Other Ambulatory Visit: Payer: Self-pay

## 2019-09-19 VITALS — BP 132/76 | HR 68 | Temp 97.2°F | Resp 16 | Ht 60.8 in | Wt 197.4 lb

## 2019-09-19 DIAGNOSIS — Z91038 Other insect allergy status: Secondary | ICD-10-CM

## 2019-09-19 DIAGNOSIS — L503 Dermatographic urticaria: Secondary | ICD-10-CM

## 2019-09-19 DIAGNOSIS — Z79899 Other long term (current) drug therapy: Secondary | ICD-10-CM

## 2019-09-19 DIAGNOSIS — G4733 Obstructive sleep apnea (adult) (pediatric): Secondary | ICD-10-CM

## 2019-09-19 DIAGNOSIS — I10 Essential (primary) hypertension: Secondary | ICD-10-CM | POA: Diagnosis not present

## 2019-09-19 DIAGNOSIS — J3089 Other allergic rhinitis: Secondary | ICD-10-CM

## 2019-09-19 DIAGNOSIS — L500 Allergic urticaria: Secondary | ICD-10-CM | POA: Diagnosis not present

## 2019-09-19 DIAGNOSIS — Z853 Personal history of malignant neoplasm of breast: Secondary | ICD-10-CM

## 2019-09-19 MED ORDER — FLUTICASONE PROPIONATE 50 MCG/ACT NA SUSP: NASAL | 5 refills | Status: DC

## 2019-09-19 MED ORDER — EPINEPHRINE 0.3 MG/0.3ML IJ SOAJ: 0.3000 mg | Freq: Once | INTRAMUSCULAR | 1 refills | Status: AC

## 2019-09-19 NOTE — Progress Notes (Signed)
[address] [place] [address] Dept: [phone]  New Patient Note  Patient ID: Marie Tanner, female    DOB: [birth year]  Age: 63 y.o. MRN: [id number] Date of Office Visit: 09/19/2019 Referring provider: Forrest Moron, MD [place],  [place] [id number]    Chief Complaint: Rash  HPI Marie Tanner presents for evaluation of an  allergic reaction after she was bitten 4 times by a spider.  She developed hives all over and had some difficulty swallowing for about 30 minutes.  She had an exterminator come to her home for the spider bites.  She has had hives for 10 to 15 years.. She has not had any tick bites.  She has not had thyroid disease.  She does not have a history of eczema.   She has a history of allergic rhinitis from dust mites.  Her symptoms are perennial.  She has nasal congestion at night.  She has a history of obstructive sleep apnea and used to use CPAP   Review of Systems  Constitutional: Negative.   HENT:       Allergic rhinitis for several years.  History of obstructive sleep apnea needing CPAP  Eyes: Negative.   Respiratory:       Pneumonia once.  No asthmatic symptoms  Cardiovascular:       History of myocardial infarct with stents.  History of deep vein thrombosis.  Femoral artery repair  Gastrointestinal:       Lactose intolerance.  Gastroesophageal reflux.  History of a liver laceration  Genitourinary:       Breast cancer in the right breast treated with surgery, chemotherapy and radiation  Musculoskeletal:       History of gout  Skin:       Urticaria for about 15 years  Neurological: Negative.   Endo/Heme/Allergies:       Type 2 diabetes since 2015.  No thyroid disease.  History of blood transfusions.  Psychiatric/Behavioral:       Depression    Outpatient Encounter Medications as of 09/19/2019  Medication Sig   acetaminophen (TYLENOL) 500 MG tablet Take 1,000 mg by mouth every 6 (six) hours as needed.   Alirocumab (PRALUENT) 150  MG/ML SOAJ Inject 150 mg into the skin every 14 (fourteen) days.   aspirin EC 81 MG tablet Take 1 tablet (81 mg total) by mouth daily.   BIDIL 20-37.5 MG tablet TAKE 1 TABLET BY MOUTH TWICE A DAY   blood glucose meter kit and supplies KIT by Does not apply route daily as needed. Dispense based on patient and insurance preference. Use up to four times daily as directed. (FOR ICD-9 250.00, 250.01).   BRILINTA 60 MG TABS tablet TAKE 1 TABLET (60 MG TOTAL) BY MOUTH 2 (TWO) TIMES DAILY.   exemestane (AROMASIN) 25 MG tablet Take by mouth.   ezetimibe (ZETIA) 10 MG tablet TAKE 1 TABLET BY MOUTH EVERY DAY   furosemide (LASIX) 40 MG tablet TAKE 1 TABLET AS NEEDED FOR SHORTNESS OF BREATH OR WEIGHT GAIN OF 2 POUNDS IN 24 HOURS OR 5 POUNDS IN 1 WEEK   glucose blood test strip Use as instructed. E11.5. To check blood glucose daily.   losartan (COZAAR) 25 MG tablet Take 25 mg by mouth as directed. TAKE 1 TABLET BY MOUTH DAILY   magnesium oxide (MAG-OX) 400 MG tablet TAKE 1 TABLET BY MOUTH EVERY DAY   metFORMIN (GLUCOPHAGE-XR) 500 MG 24 hr tablet Take 1 tablet (500 mg total) by mouth daily  with breakfast.   metoprolol succinate (TOPROL-XL) 25 MG 24 hr tablet Take 1 tablet (25 mg total) by mouth daily.   montelukast (SINGULAIR) 10 MG tablet Take 1 tablet (10 mg total) by mouth at bedtime.   nitroGLYCERIN (NITROSTAT) 0.4 MG SL tablet Place 1 tablet (0.4 mg total) under the tongue every 5 (five) minutes as needed for chest pain. max of 3 if no relief call 911   pantoprazole (PROTONIX) 40 MG tablet Take 1 tablet (40 mg total) by mouth daily.   polyethylene glycol (MIRALAX / GLYCOLAX) 17 g packet Take 17 g by mouth daily.   rosuvastatin (CRESTOR) 20 MG tablet Take 1/2 tablet by mouth 3 times per week   spironolactone (ALDACTONE) 25 MG tablet Take 1 tablet (25 mg total) by mouth daily.   EPINEPHrine 0.3 mg/0.3 mL IJ SOAJ injection Inject 0.3 mLs (0.3 mg total) into the muscle once for 1 dose.    fluticasone (FLONASE) 50 MCG/ACT nasal spray 2 sprays per nostril at night for stuffy nose at 7pm   triamcinolone cream (KENALOG) 0.1 % Apply 1 application topically 2 (two) times daily.   [DISCONTINUED] albuterol (PROVENTIL HFA;VENTOLIN HFA) 108 (90 Base) MCG/ACT inhaler Inhale 2 puffs into the lungs every 4 (four) hours as needed for wheezing or shortness of breath (cough, shortness of breath or wheezing.). (Patient not taking: Reported on 06/08/2019)   [DISCONTINUED] exemestane (AROMASIN) 25 MG tablet Take 25 mg by mouth daily after breakfast.   [DISCONTINUED] fluconazole (DIFLUCAN) 150 MG tablet Take 1 tablet (150 mg total) by mouth daily. Take second dose in 3 days   [DISCONTINUED] levofloxacin (LEVAQUIN) 750 MG tablet Take 1 tablet (750 mg total) by mouth daily. X 7 days   [DISCONTINUED] loratadine (CLARITIN) 10 MG tablet Take 10 mg by mouth daily as needed for allergies.    [DISCONTINUED] SUMAtriptan (IMITREX) 25 MG tablet Take one tablet at the onset of headache. May repeat in 2 hours if headache persists or recurs. (Patient not taking: Reported on 06/08/2019)   No facility-administered encounter medications on file as of 09/19/2019.      Drug Allergies:  Allergies  Allergen Reactions   Dilaudid [Hydromorphone Hcl] Anaphylaxis, Itching and Swelling   Hydromorphone Anaphylaxis, Itching and Swelling   Epinephrine Anxiety and Other (See Comments)    Severe anxiety   Lactose Intolerance (Gi) Diarrhea    Family History: Nimrat's family history includes Alzheimer's disease in her father; Asthma in her daughter; COPD in her mother; Diabetes in her brother and mother; Heart attack in her father; Hypertension in her mother..  Family history is positive for hayfever, and urticaria.  Family history is negative for sinus problems, angioedema, eczema, food allergies, lupus, chronic bronchitis or emphysema.  Social and environmental.  She is disabled.  She does not have any animals inside  the house.  She is not exposed to cigarette smoking.  She has not smoked cigarettes in the past.    Physical Exam: BP 132/76    Pulse 68    Temp (!) 97.2 F (36.2 C) (Temporal)    Resp 16    Ht 5' 0.8" (1.544 m)    Wt 197 lb 6.4 oz (89.5 kg)    SpO2 100%    BMI 37.54 kg/m    Physical Exam Vitals signs reviewed.  Constitutional:      Appearance: Normal appearance. She is obese.  HENT:     Head:     Comments: Eyes normal.  Ears normal.  Nose mild swelling  of the nasal turbinates.  Pharynx normal. Neck:     Musculoskeletal: Neck supple.     Comments: No thyromegaly Cardiovascular:     Rate and Rhythm: Normal rate and regular rhythm.     Comments: S1-S2 normal.  She had a grade 2/6 systolic ejection murmur best heard in the aortic valve area Pulmonary:     Comments: Clear to percussion and auscultation Abdominal:     Palpations: Abdomen is soft.     Tenderness: There is no abdominal tenderness.     Comments: No hepatosplenomegaly  Lymphadenopathy:     Cervical: No cervical adenopathy.  Skin:    Comments: Clear but dermographia present  Neurological:     General: No focal deficit present.     Mental Status: She is alert and oriented to person, place, and time. Mental status is at baseline.  Psychiatric:        Mood and Affect: Mood normal.        Behavior: Behavior normal.        Thought Content: Thought content normal.        Judgment: Judgment normal.     Diagnostics: Allergy skin tests were positive to dust mites, molds, cockroach.  Mild reactivity to ragweed.  Skin testing to foods was negative   Assessment  Assessment and Plan: 1. Allergic urticaria   2. Dermographia   3. Other allergic rhinitis   4. Essential hypertension   5. Current use of beta blocker   6. Obstructive sleep apnea syndrome   7. Allergy to insect bites   8. History of breast cancer   9.   Adult onset diabetes since 2015  Meds ordered this encounter  Medications   EPINEPHrine 0.3 mg/0.3  mL IJ SOAJ injection    Sig: Inject 0.3 mLs (0.3 mg total) into the muscle once for 1 dose.    Dispense:  2 each    Refill:  1   fluticasone (FLONASE) 50 MCG/ACT nasal spray    Sig: 2 sprays per nostril at night for stuffy nose at 7pm    Dispense:  18.2 mL    Refill:  5    Patient Instructions  Environmental control of dust mite and mold Fluticasone 2 sprays per nostril at night for stuffy nose at 7 PM to hopefully help also with your obstructive sleep apnea Zyrtec 10 mg-take 1 tablet once a day for itching or runny nose, but you may take Zyrtec 1 tablet twice a day if the itching is not controlled.  You may buy the store brand of Zyrtec which is cetirizine 10 mg  Do foods with salicylates make you itch?  If you have an allergic reaction take Benadryl 50 mg every 4 hours and if you have life-threatening symptoms inject with EpiPen 0.3 mg  There is no accurate testing for an allergy to a spider bite.  You have dermographia which makes it more likely to have hives following an insect bite.  I recommend that you take Zyrtec 10 mg once a day  Call us if you are not doing well on this treatment plan Continue on your other medications   Return in about 4 weeks (around 10/17/2019).   Thank you for the opportunity to care for this patient.  Please do not hesitate to contact me with questions.  Penne Lash, M.D.  [facility] [address] [place], [place] [address] [phone]

## 2019-09-19 NOTE — Patient Instructions (Addendum)
Environmental control of dust mite and mold Fluticasone 2 sprays per nostril at night for stuffy nose at 7 PM to hopefully help also with your obstructive sleep apnea Zyrtec 10 mg-take 1 tablet once a day for itching or runny nose, but you may take Zyrtec 1 tablet twice a day if the itching is not controlled.  You may buy the store brand of Zyrtec which is cetirizine 10 mg  Do foods with salicylates make you itch?  If you have an allergic reaction take Benadryl 50 mg every 4 hours and if you have life-threatening symptoms inject with EpiPen 0.3 mg  There is no accurate testing for an allergy to a spider bite.  You have dermographia which makes it more likely to have hives following an insect bite.  I recommend that you take Zyrtec 10 mg once a day  Call us if you are not doing well on this treatment plan Continue on your other medications

## 2019-10-17 ENCOUNTER — Encounter: Payer: Self-pay | Admitting: Family Medicine

## 2019-10-17 ENCOUNTER — Ambulatory Visit (INDEPENDENT_AMBULATORY_CARE_PROVIDER_SITE_OTHER): Payer: BC Managed Care – PPO

## 2019-10-17 ENCOUNTER — Ambulatory Visit: Payer: BC Managed Care – PPO | Admitting: Family Medicine

## 2019-10-17 ENCOUNTER — Other Ambulatory Visit: Payer: Self-pay

## 2019-10-17 VITALS — BP 121/80 | HR 82 | Temp 99.2°F | Wt 194.8 lb

## 2019-10-17 DIAGNOSIS — E11628 Type 2 diabetes mellitus with other skin complications: Secondary | ICD-10-CM | POA: Diagnosis not present

## 2019-10-17 DIAGNOSIS — E1165 Type 2 diabetes mellitus with hyperglycemia: Secondary | ICD-10-CM | POA: Diagnosis not present

## 2019-10-17 DIAGNOSIS — Z23 Encounter for immunization: Secondary | ICD-10-CM | POA: Diagnosis not present

## 2019-10-17 DIAGNOSIS — M79604 Pain in right leg: Secondary | ICD-10-CM

## 2019-10-17 LAB — POCT GLYCOSYLATED HEMOGLOBIN (HGB A1C): Hemoglobin A1C: 6.7 % — AB (ref 4.0–5.6)

## 2019-10-17 NOTE — Progress Notes (Signed)
Established Patient Office Visit  Subjective:  Patient ID: Marie Tanner, female    DOB: 08-Dec-1955  Age: 63 y.o. MRN: [id number]  CC:  Chief Complaint  Patient presents with  . Diabetes    3 month f/u on diabetes Blood sugar this am was 101 at home  . Leg Pain    pain on right leg for the past 2 week been bad  . Flank Pain    flank pain been going on for 3 week but its not constant but just want to talk about it    HPI Marie Tanner presents for  Diabetes and Skin Patient reports that her hives are slightly better she reports that she has been having fewer hives  She states that her diabetes has been good and her fasting glucose was 101 she denies hyperglycemia   Right knee pain Pt reports right knee and leg pain  She denies numbness and tingling She denies edema She also reports that there is a pain so severe she can't pik up the leg  Past Medical History:  Diagnosis Tanner  . Breast cancer ([place])   . Cancer ([place])   . Coronary artery disease   . Depression   . DVT (deep venous thrombosis) ([place]) 11/15   left femerol artery injury during resusciation.  [name] GERD (gastroesophageal reflux disease)   . HCAP (healthcare-associated pneumonia) 11/22/2014  . Heart murmur   . High cholesterol   . History of blood transfusion 09/2014   related to MI  . History of gout   . Hypertension   . Liver laceration 10/19/14  . Pulmonary edema 10/19/14  . Renal disorder   . Sleep apnea    "suppose to wear mask but I don't" (11/22/2014)  . STEMI (ST elevation myocardial infarction) ([place]) 10/19/14  . Type II diabetes mellitus ([place])   . Urticaria     Past Surgical History:  Procedure Laterality Tanner  . ABDOMINAL HYSTERECTOMY  ~ 2000  . APPLICATION OF WOUND VAC  10/2014   "over my naval" (11/22/2014)  . BREAST LUMPECTOMY WITH AXILLARY LYMPH NODE BIOPSY    . BREAST SURGERY    . [place]; 1982  . CORONARY ANGIOPLASTY WITH STENT PLACEMENT  09/2014   "multiple"/notes  11/22/2014  . EXTRACORPOREAL CIRCULATION    . Femerol artery repair Right 09/2014   pt insists it was a right femoral artery repair on 11/22/2014  . LAPAROSCOPIC ABDOMINAL EXPLORATION    . LEFT HEART CATH AND CORONARY ANGIOGRAPHY N/A 12/07/2017   Procedure: LEFT HEART CATH AND CORONARY ANGIOGRAPHY;  Surgeon: Leonie Man, MD;  Location: [facility];  Service: Cardiovascular;  Laterality: N/A;  . LEFT VENTRICULAR ASSIST DEVICE     pt is not aware of this hx on 11/22/2014  . Liver laceration repair  09/2014   related to CPR/notes 11/22/2014  . TRACHEOSTOMY  09/2014   "closed on it's own when they took it out"  . TUBAL LIGATION  1982    Family History  Problem Relation Age of Onset  . Diabetes Mother   . Hypertension Mother   . COPD Mother   . Heart attack Father   . Alzheimer's disease Father   . Diabetes Brother   . Asthma Daughter   . Stroke Neg Hx   . Colon cancer Neg Hx   . Stomach cancer Neg Hx   . Pancreatic cancer Neg Hx   . AAA (abdominal aortic aneurysm) Neg Hx  Social History   Socioeconomic History  . Marital status: Divorced    Spouse name: Not on file  . Number of children: 2  . Years of education: Not on file  . Highest education level: Not on file  Occupational History  . Not on file  Social Needs  . Financial resource strain: Not on file  . Food insecurity    Worry: Not on file    Inability: Not on file  . Transportation needs    Medical: Not on file    Non-medical: Not on file  Tobacco Use  . Smoking status: Never Smoker  . Smokeless tobacco: Never Used  Substance and Sexual Activity  . Alcohol use: Yes    Comment: occ  . Drug use: No  . Sexual activity: Not Currently  Lifestyle  . Physical activity    Days per week: Not on file    Minutes per session: Not on file  . Stress: Not on file  Relationships  . Social Herbalist on phone: Not on file    Gets together: Not on file    Attends religious service: Not on file     Active member of club or organization: Not on file    Attends meetings of clubs or organizations: Not on file    Relationship status: Not on file  . Intimate partner violence    Fear of current or ex partner: Not on file    Emotionally abused: Not on file    Physically abused: Not on file    Forced sexual activity: Not on file  Other Topics Concern  . Not on file  Social History Narrative  . Not on file    Outpatient Medications Prior to Visit  Medication Sig Dispense Refill  . acetaminophen (TYLENOL) 500 MG tablet Take 1,000 mg by mouth every 6 (six) hours as needed.    . Alirocumab (PRALUENT) 150 MG/ML SOAJ Inject 150 mg into the skin every 14 (fourteen) days. 2 pen 12  . aspirin EC 81 MG tablet Take 1 tablet (81 mg total) by mouth daily.    [name] BIDIL 20-37.5 MG tablet TAKE 1 TABLET BY MOUTH TWICE A DAY 60 tablet 5  . blood glucose meter kit and supplies KIT by Does not apply route daily as needed. Dispense based on patient and insurance preference. Use up to four times daily as directed. (FOR ICD-9 250.00, 250.01).    . BRILINTA 60 MG TABS tablet TAKE 1 TABLET (60 MG TOTAL) BY MOUTH 2 (TWO) TIMES DAILY. 180 tablet 2  . exemestane (AROMASIN) 25 MG tablet Take by mouth.    . ezetimibe (ZETIA) 10 MG tablet TAKE 1 TABLET BY MOUTH EVERY DAY 30 tablet 5  . fluticasone (FLONASE) 50 MCG/ACT nasal spray 2 sprays per nostril at night for stuffy nose at 7pm 18.2 mL 5  . furosemide (LASIX) 40 MG tablet TAKE 1 TABLET AS NEEDED FOR SHORTNESS OF BREATH OR WEIGHT GAIN OF 2 POUNDS IN 24 HOURS OR 5 POUNDS IN 1 WEEK 30 tablet 1  . glucose blood test strip Use as instructed. E11.5. To check blood glucose daily. 100 each 12  . losartan (COZAAR) 25 MG tablet Take 25 mg by mouth as directed. TAKE 1 TABLET BY MOUTH DAILY    . magnesium oxide (MAG-OX) 400 MG tablet TAKE 1 TABLET BY MOUTH EVERY DAY 90 tablet 0  . metFORMIN (GLUCOPHAGE-XR) 500 MG 24 hr tablet Take 1 tablet (500 mg total) by mouth  daily with  breakfast. 90 tablet 1  . metoprolol succinate (TOPROL-XL) 25 MG 24 hr tablet Take 1 tablet (25 mg total) by mouth daily. 30 tablet 11  . montelukast (SINGULAIR) 10 MG tablet Take 1 tablet (10 mg total) by mouth at bedtime. 90 tablet 3  . nitroGLYCERIN (NITROSTAT) 0.4 MG SL tablet Place 1 tablet (0.4 mg total) under the tongue every 5 (five) minutes as needed for chest pain. max of 3 if no relief call 911 25 tablet PRN  . pantoprazole (PROTONIX) 40 MG tablet Take 1 tablet (40 mg total) by mouth daily. 30 tablet 11  . polyethylene glycol (MIRALAX / GLYCOLAX) 17 g packet Take 17 g by mouth daily.    . rosuvastatin (CRESTOR) 20 MG tablet Take 1/2 tablet by mouth 3 times per week 30 tablet 2  . spironolactone (ALDACTONE) 25 MG tablet Take 1 tablet (25 mg total) by mouth daily. 30 tablet 5  . triamcinolone cream (KENALOG) 0.1 % Apply 1 application topically 2 (two) times daily. 80 g 3   No facility-administered medications prior to visit.     Allergies  Allergen Reactions  . Dilaudid [Hydromorphone Hcl] Anaphylaxis, Itching and Swelling  . Hydromorphone Anaphylaxis, Itching and Swelling  . Epinephrine Anxiety and Other (See Comments)    Severe anxiety  . Lactose Intolerance (Gi) Diarrhea    ROS Review of Systems Review of Systems  Constitutional: Negative for activity change, appetite change, chills and fever.  HENT: Negative for congestion, nosebleeds, trouble swallowing and voice change.   Respiratory: Negative for cough, shortness of breath and wheezing.   Gastrointestinal: Negative for diarrhea, nausea and vomiting.  Genitourinary: Negative for difficulty urinating, dysuria, flank pain and hematuria.  Musculoskeletal: Negative for back pain, joint swelling and neck pain.  Neurological: Negative for dizziness, speech difficulty, light-headedness and numbness.  See HPI. All other review of systems negative.     Objective:    Physical Exam  BP 121/80 (BP Location: Right Arm,  Patient Position: Sitting, Cuff Size: Large)   Pulse 82   Temp 99.2 F (37.3 C) (Oral)   Wt 194 lb 12.8 oz (88.4 kg)   SpO2 98%   BMI 37.05 kg/m  Wt Readings from Last 3 Encounters:  10/17/19 194 lb 12.8 oz (88.4 kg)  09/19/19 197 lb 6.4 oz (89.5 kg)  09/08/19 195 lb 6.4 oz (88.6 kg)   Physical Exam  Constitutional: Oriented to person, place, and time. Appears well-developed and well-nourished.  HENT:  Head: Normocephalic and atraumatic.  Eyes: Conjunctivae and EOM are normal.  Cardiovascular: Normal rate, regular rhythm, normal heart sounds and intact distal pulses.  No murmur heard. Pulmonary/Chest: Effort normal and breath sounds normal. No stridor. No respiratory distress. Has no wheezes.  Neurological: Is alert and oriented to person, place, and time.  Skin: Skin is warm. Capillary refill takes less than 2 seconds.  Psychiatric: Has a normal mood and affect. Behavior is normal. Judgment and thought content normal.   Right leg without edema, knee without laxity, no effusion. Some joint line tenderness.  There are no preventive care reminders to display for this patient.  There are no preventive care reminders to display for this patient.  Lab Results  Component Value Tanner   TSH 2.560 10/28/2017   Lab Results  Component Value Tanner   WBC 3.1 (L) 12/03/2017   HGB 12.1 12/03/2017   HCT 35.6 12/03/2017   MCV 82 12/03/2017   PLT 258 12/03/2017   Lab Results  Component  Value Tanner   NA 137 10/17/2019   K 4.6 10/17/2019   CO2 20 10/17/2019   GLUCOSE 105 (H) 10/17/2019   BUN 13 10/17/2019   CREATININE 0.75 10/17/2019   BILITOT 0.8 10/17/2019   ALKPHOS 90 10/17/2019   AST 19 10/17/2019   ALT 9 10/17/2019   PROT 8.3 10/17/2019   ALBUMIN 4.2 10/17/2019   CALCIUM 10.0 10/17/2019   ANIONGAP 10 04/10/2016   Lab Results  Component Value Tanner   CHOL 151 10/17/2019   Lab Results  Component Value Tanner   HDL 51 10/17/2019   Lab Results  Component Value Tanner    LDLCALC 85 10/17/2019   Lab Results  Component Value Tanner   TRIG 78 10/17/2019   Lab Results  Component Value Tanner   CHOLHDL 3.0 10/17/2019   Lab Results  Component Value Tanner   HGBA1C 6.7 (A) 10/17/2019      Assessment & Plan:   Problem List Items Addressed This Visit      Endocrine   DM type 2 (diabetes mellitus, type 2) ([place])  - well controlled hemoglobin a1c is at goal Continue exercise Lipids monitored and renal function in range On metformin On arb On asa 53m Reviewed diabetic foot care Emphasized importance of eye and dental exam   Lab Results  Component Value Tanner   HGBA1C 6.7 (A) 10/17/2019      Relevant Orders   POCT glycosylated hemoglobin (Hb A1C) (Completed)   Comprehensive metabolic panel (Completed)   Lipid Panel (Completed)    Other Visit Diagnoses    Diabetes with skin complication ([place]    -  Primary   Relevant Orders   HM Diabetes Foot Exam (Completed)   Right leg pain    -  Discussed orthopedic referral   Relevant Orders   DG Knee Complete 4 Views Right (Completed)   Flu Vaccine QUAD High Dose(Fluad)   Ambulatory referral to Orthopedic Surgery      No orders of the defined types were placed in this encounter.   Follow-up: No follow-ups on file.    ZForrest Moron MD

## 2019-10-17 NOTE — Patient Instructions (Signed)
CLINICAL DATA:  Right leg pain  EXAM: RIGHT KNEE - COMPLETE 4+ VIEW  COMPARISON:  None.  FINDINGS: No fracture or dislocation of the right knee. Joint spaces are preserved. Small, nonspecific knee joint effusion. Vascular calcinosis.  IMPRESSION: No fracture or dislocation of the right knee. Joint spaces are preserved. Small, nonspecific knee joint effusion.   Electronically Signed   By: Eddie Candle M.D.   On: 10/17/2019 09:52

## 2019-10-18 LAB — COMPREHENSIVE METABOLIC PANEL
ALT: 9 IU/L (ref 0–32)
AST: 19 IU/L (ref 0–40)
Albumin/Globulin Ratio: 1 — ABNORMAL LOW (ref 1.2–2.2)
Albumin: 4.2 g/dL (ref 3.8–4.8)
Alkaline Phosphatase: 90 IU/L (ref 39–117)
BUN/Creatinine Ratio: 17 (ref 12–28)
BUN: 13 mg/dL (ref 8–27)
Bilirubin Total: 0.8 mg/dL (ref 0.0–1.2)
CO2: 20 mmol/L (ref 20–29)
Calcium: 10 mg/dL (ref 8.7–10.3)
Chloride: 101 mmol/L (ref 96–106)
Creatinine, Ser: 0.75 mg/dL (ref 0.57–1.00)
GFR calc Af Amer: 99 mL/min/{1.73_m2} (ref >59)
GFR calc non Af Amer: 86 mL/min/{1.73_m2} (ref >59)
Globulin, Total: 4.1 g/dL (ref 1.5–4.5)
Glucose: 105 mg/dL — ABNORMAL HIGH (ref 65–99)
Potassium: 4.6 mmol/L (ref 3.5–5.2)
Sodium: 137 mmol/L (ref 134–144)
Total Protein: 8.3 g/dL (ref 6.0–8.5)

## 2019-10-18 LAB — LIPID PANEL
Chol/HDL Ratio: 3 ratio (ref 0.0–4.4)
Cholesterol, Total: 151 mg/dL (ref 100–199)
HDL: 51 mg/dL (ref >39)
LDL Chol Calc (NIH): 85 mg/dL (ref 0–99)
Triglycerides: 78 mg/dL (ref 0–149)
VLDL Cholesterol Cal: 15 mg/dL (ref 5–40)

## 2019-10-23 ENCOUNTER — Ambulatory Visit: Payer: BC Managed Care – PPO | Admitting: Family Medicine

## 2019-10-24 ENCOUNTER — Ambulatory Visit: Payer: BC Managed Care – PPO | Admitting: Pediatrics

## 2019-10-30 NOTE — Telephone Encounter (Signed)
This encounter was created in error - please disregard.

## 2019-10-31 ENCOUNTER — Other Ambulatory Visit: Payer: Self-pay

## 2019-10-31 ENCOUNTER — Encounter: Payer: Self-pay | Admitting: Pediatrics

## 2019-10-31 ENCOUNTER — Ambulatory Visit (INDEPENDENT_AMBULATORY_CARE_PROVIDER_SITE_OTHER): Payer: BC Managed Care – PPO | Admitting: Pediatrics

## 2019-10-31 VITALS — BP 126/72 | HR 86 | Temp 98.1°F | Resp 18

## 2019-10-31 DIAGNOSIS — L503 Dermatographic urticaria: Secondary | ICD-10-CM | POA: Diagnosis not present

## 2019-10-31 DIAGNOSIS — Z91038 Other insect allergy status: Secondary | ICD-10-CM | POA: Insufficient documentation

## 2019-10-31 DIAGNOSIS — Z79899 Other long term (current) drug therapy: Secondary | ICD-10-CM

## 2019-10-31 DIAGNOSIS — J3089 Other allergic rhinitis: Secondary | ICD-10-CM | POA: Diagnosis not present

## 2019-10-31 DIAGNOSIS — G4733 Obstructive sleep apnea (adult) (pediatric): Secondary | ICD-10-CM

## 2019-10-31 DIAGNOSIS — L500 Allergic urticaria: Secondary | ICD-10-CM

## 2019-10-31 MED ORDER — EPINEPHRINE 0.3 MG/0.3ML IJ SOAJ: 0.3000 mg | INTRAMUSCULAR | 1 refills | Status: DC | PRN

## 2019-10-31 NOTE — Progress Notes (Signed)
[place] [address] Dept: [phone]  FOLLOW UP NOTE  Patient ID: Marie Tanner, female    DOB: [birth year]  Age: 63 y.o. MRN: [id number] Date of Office Visit: 10/31/2019  Assessment  Chief Complaint: Rash  HPI Marie Tanner is a 63 year old female who presents to the clinic for a follow up visit. She reports her hives have been well controlled with no flares or breakthrough hives since her last visit. She reports that she continues to experience itch, especially on her right arm in the area of the original spider bite. She also reports a new mosquito bite on the left side of her neck that is itching. She reports that she has not started cetirizine at this time. Allergic rhinitis is reported as moderately well controlled with symptoms including nasal congestion and clear rhinorrhea for which she uses Flonase and montelukast 10 mg daily. She reports Flonase is helping to reduce her nasal congestion. She demonstrates poor technique with Flonase application in the clinic today. She has implemented many of the allergen avoidance measures including dust mite free covers on her mattress and pillows and reports a decrease in symptoms of allergic rhinitis. She has not experienced an additional stinging insect encounter since her last visit to this clinic. She has not yet picked up her epinephrine auto-injector at this time. Her current medications are listed in the chart.    Drug Allergies:  Allergies  Allergen Reactions  . Dilaudid [Hydromorphone Hcl] Anaphylaxis, Itching and Swelling  . Hydromorphone Anaphylaxis, Itching and Swelling  . Epinephrine Anxiety and Other (See Comments)    Severe anxiety  . Lactose Intolerance (Gi) Diarrhea    Physical Exam: BP 126/72   Pulse 86   Temp 98.1 F (36.7 C) (Temporal)   Resp 18   SpO2 99%    Physical Exam Vitals signs reviewed.  Constitutional:      Appearance: Normal appearance.  HENT:     Head: Normocephalic and  atraumatic.     Right Ear: Tympanic membrane normal.     Left Ear: Tympanic membrane normal.     Nose:     Comments: Bilateral nares edematous and pale with no nasal drainage noted. Pharynx normal. Ears normal. Eyes normal.    Mouth/Throat:     Pharynx: Oropharynx is clear.  Eyes:     Conjunctiva/sclera: Conjunctivae normal.  Neck:     Musculoskeletal: Normal range of motion and neck supple.  Cardiovascular:     Rate and Rhythm: Normal rate and regular rhythm.     Heart sounds: Murmur present.     Comments: Systolic murmur noted Musculoskeletal: Normal range of motion.  Skin:    General: Skin is warm and dry.     Comments: Hyperpigmented area on right arm size of pencil eraser. No open areas no erythema.   Neurological:     Mental Status: She is alert and oriented to person, place, and time.  Psychiatric:        Mood and Affect: Mood normal.        Behavior: Behavior normal.        Thought Content: Thought content normal.        Judgment: Judgment normal.     Assessment and Plan: 1. Allergic urticaria   2. Dermographia   3. Other allergic rhinitis   4. Current use of beta blocker   5. Allergy to insect bites   6. Obstructive sleep apnea syndrome     Meds ordered this encounter  Medications  . EPINEPHrine (AUVI-Q) 0.3 mg/0.3 mL IJ SOAJ injection    Sig: Inject 0.3 mLs (0.3 mg total) into the muscle as needed for anaphylaxis.    Dispense:  1 each    Refill:  1    Patient Instructions  Continue fluticasone 2 sprays per nostril at night for stuffy nose at 7 PM.  In the right nostril, point the applicator out toward the right ear. In the left nostril, point the applicator out toward the left ear Consider saline nasal rinses as needed for nasal symptoms. Use this before any medicated nasal sprays for best result  Begin Zyrtec (cetirizine) 10 mg-take 1 tablet once a day for itching or runny nose, but you may take Zyrtec 1 tablet twice a day if the itching is not controlled.     If you have an allergic reaction take Benadryl 50 mg every 4 hours and if you have life-threatening symptoms inject with EpiPen 0.3 mg  Continue insect avoidance. You have dermographia which makes it more likely to have hives following an insect bite.  I recommend that you take Zyrtec (cetirizine) 10 mg once a day  Call us if you are not doing well on this treatment plan  Continue on your other medications  Follow up in 5 months or sooner if needed   Return in about 5 months (around 03/30/2020), or if symptoms worsen or fail to improve.   Thank you for the opportunity to care for this patient.  Please do not hesitate to contact me with questions.  Gareth Morgan, FNP [facility]  I have provided oversight concerning Gareth Morgan' evaluation and treatment of this patient's health issues addressed during today's encounter. I agree with the assessment and therapeutic plan as outlined in the note.   Thank you for the opportunity to care for this patient.  Please do not hesitate to contact me with questions.  Penne Lash, M.D.  [facility] [address] [place], [place] [address] [phone]

## 2019-10-31 NOTE — Patient Instructions (Addendum)
Continue fluticasone 2 sprays per nostril at night for stuffy nose at 7 PM.  In the right nostril, point the applicator out toward the right ear. In the left nostril, point the applicator out toward the left ear Consider saline nasal rinses as needed for nasal symptoms. Use this before any medicated nasal sprays for best result  Begin Zyrtec (cetirizine) 10 mg-take 1 tablet once a day for itching or runny nose, but you may take Zyrtec 1 tablet twice a day if the itching is not controlled.    If you have an allergic reaction take Benadryl 50 mg every 4 hours and if you have life-threatening symptoms inject with EpiPen 0.3 mg  Continue insect avoidance. You have dermographia which makes it more likely to have hives following an insect bite.  I recommend that you take Zyrtec (cetirizine) 10 mg once a day  Call us if you are not doing well on this treatment plan  Continue on your other medications  Follow up in 5 months or sooner if needed      Strategies for Safer Mosquito Avoidance  by [place] are a terrible nuisance in the muggy summer months, especially now that the ferocious Asian tiger mosquito has made a permanent home here in [place]. The arrival of [place] virus has added some urgency to mosquito control measures, but spray programs and many repellents may do more harm than good in the long term. Choosing the least-toxic solutions can protect both your health and comfort in mosquito season. Here are some suggestions for safer and more effective bite avoidance this summer.   Population Control  Keeping mosquito populations in check is the most important way to avoid bites. It's no secret that removing sources of standing water is crucial to eliminating mosquito breeding grounds. Common breeding sites to watch for include:  * Rain gutters. Clean them out and offer to do the same for elderly neighbors or others who may not be able to do the job themselves.  Remember that mosquito control is a community-wide effort.  * Flowerpots, buckets and old tires. Be sure empty containers cannot hold water.  * Bird baths and pet dishes. Empty and clean them weekly.  * Recycling bins and the cans inside. These may harbor stagnant water if not emptied regularly.  * Rain barrels. Be sure they are sealed off from mosquitoes.  * Storm drains. Watch for clogs from branches and garbage.  Insecticide sprays targeting adult mosquitoes can only reduce mosquito populations for a day or two. In fact, since insecticides also kill off important mosquito predators such as dragonflies, a spray program can actually be counter-productive by leaving the rebounding mosquito population without natural enemies.  Instead, interrupt the breeding cycle by using the nontoxic bacterial larvicide Bacillus thuringiensis var. israelensis (Bti). Bti is sold in convenient donuts called "mosquito dunks" that you can safely use in your bird bath, rain barrel or low areas around your yard to kill mosquito larvae before the adults emerge and spread throughout the community, where they become much harder to kill. Bti is not harmful to fish, birds or mammals, and single applications can remain effective for a month or more, even if the water source dries out and refills.   Safer Repellents  If you'll be outdoors at dawn or dusk when mosquitoes are most active, wear long clothes that don't leave skin exposed. (You may use insect repellent on your clothes). When you do get bites, soothe them  by slathering on an astringent such as witch hazel after you come inside - it will prevent scratching and allow bites to heal quickly.  Lately many public health officials concerned about [place] virus have been advising people to use repellents containing the pesticide DEET (N,N-diethyl-meta-toluamide). While DEET is an extremely effective mosquito repellent, it is also a neurotoxin, and studies have shown that  prolonged frequent exposure can irritate skin, cause muscle twitching and weakness and harm the brain and nervous system, especially when combined with other pesticides such as permethrin.  Consumer studies report that [employer]'s Skin-So-Soft and herbal repellents containing citronella can be just as effective as DEET at repelling mosquitoes but need to be applied more often. The solution is to choose the safer formulas and reapply as needed.  General guidelines for using any insect repellent:  * Choose oils or lotions rather than sprays, which produce fine particles that are easily inhaled.  * Do not apply repellents to broken skin.  * Do not allow children to apply their own repellent, and do not apply repellents containing DEET or other pesticides directly to children's skin. If you use such products, they can be applied to children's clothing instead.  * Do not use sunscreen/repellent combinations. Sunscreen needs to be reapplied more often than repellents, so the combination products can result in overexposure to pesticides.  * Wash off all repellent from skin and clothing immediately after coming indoors.  Area-wide repellent strategies can also be effective for outdoor gatherings. There are various contraptions available that emit carbon dioxide to trap mosquitoes (such as the Mosquito Magnet and Mosquito Deleto). These are expensive, but they do work, and some companies will even rent them to you for an outdoor event. Citronella candles are also effective when there is no breeze, but beware of candles containing pesticides - the smoke is easily inhaled and can irritate the airway. Placing fans around your porch or patio can blow mosquitoes away.

## 2019-11-01 ENCOUNTER — Encounter: Payer: Self-pay | Admitting: Family Medicine

## 2019-11-01 ENCOUNTER — Ambulatory Visit (INDEPENDENT_AMBULATORY_CARE_PROVIDER_SITE_OTHER): Payer: BC Managed Care – PPO | Admitting: Family Medicine

## 2019-11-01 DIAGNOSIS — M79604 Pain in right leg: Secondary | ICD-10-CM | POA: Diagnosis not present

## 2019-11-01 NOTE — Progress Notes (Signed)
Office Visit Note   Patient: Marie Tanner           Date of Birth: 1956/03/23           MRN: [id number] Visit Date: 11/01/2019 Requested by: Forrest Moron, MD [place],  [place] [address] PCP: Forrest Moron, MD  Subjective: Chief Complaint  Patient presents with  . Right Leg - Pain    Pain lateral right leg x greater than 1 year. H/o femoral artery rupture in 2015 - had this repaired. Pain is concentrated mostly around the knee. Occasional swelling and popping. No locking. Weakness.    HPI: She is here with right leg pain.  Symptoms started about a year ago, no definite injury.  She started noticing pain and intermittent stiffness especially around the knee.  Sometimes she feels it in the anterior lateral hip.  She has tried Tylenol with no significant improvement.  She is status post cardiac stent placement in [id number] and had complications of a femoral artery rupture which was repaired.  She recovered from a cardiac standpoint.  She does not think this pain is related to that.  She saw her PCP recently and had x-rays obtained which I reviewed, the x-rays of her knees show mild to moderate patellofemoral spurring and very mild tibiofemoral joint space narrowing, no sign of loose body.  She has diabetes which has been under pretty good control with A1c of 6.7.  She is on anticoagulants for cardiac purposes.               ROS: No fevers or chills.  All other systems were reviewed and are negative.  Objective: Vital Signs: There were no vitals taken for this visit.  Physical Exam:  General:  Alert and oriented, in no acute distress. Pulm:  Breathing unlabored. Psy:  Normal mood, congruent affect. Skin: No rash or erythema. Right hip: She has slight decreased range of motion with internal rotation on both sides but not a lot of pain.  She has some tenderness on the greater trochanter of the right hip. Right knee: 1+ effusion with no warmth.  2+ patellofemoral crepitus  bilaterally.  Mild pain with patella compression, moderate tenderness over the medial and lateral joint lines.  Full active extension and flexion of 120 degrees.  No palpable click with McMurray's.  Imaging: None today.  Assessment & Plan: 1.  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 -We discussed a variety of options and she would like to try over-the-counter turmeric, she will also check with her cardiologist to see if she can try Voltaren gel. -Future options would include physical therapy, cortisone injection, gel injections.  I will see her back as needed.     Procedures: No procedures performed  No notes on file     PMFS History: Patient Active Problem List   Diagnosis Date Noted  . Allergic urticaria 10/31/2019  . Dermographia 10/31/2019  . Other allergic rhinitis 10/31/2019  . Current use of beta blocker 10/31/2019  . Allergy to insect bites 10/31/2019  . Hx of colonic polyp 01/03/2018  . Angina, class III ([place]) 12/07/2017  . Abnormal nuclear stress test 12/07/2017  . Type II diabetes mellitus ([place])   . Sleep apnea   . Renal disorder   . Hypertension   . History of gout   . Heart murmur   . GERD (gastroesophageal reflux disease)   . Coronary artery disease involving native coronary artery of native  heart with angina pectoris ([place])   . Cancer ([place])   . Breast cancer ([place])   . Hyperlipidemia LDL goal <70 11/16/2016  . Malignant neoplasm of female breast ([place]) 01/24/2016  . Abnormal finding on mammography 01/17/2016  . Chest pain 06/06/2015  . Diarrhea 06/06/2015  . Essential hypertension 06/06/2015  . Depression   . Encounter for therapeutic drug monitoring 02/19/2015  . Mitral regurgitation 01/30/2015  . 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[place]) 01/30/2015  . Left ventricular systolic dysfunction [id number]  . Dyspnea 11/24/2014  . Cardiomyopathy ([place]) 11/24/2014  . DVT (deep venous thrombosis) ([place])  11/24/2014  . DM type 2 (diabetes mellitus, type 2) ([place]) 11/24/2014  . HCAP (healthcare-associated pneumonia) 11/22/2014  . 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 Controlled type 2 diabetes mellitus without complication ([place]) [id number]  . High sodium levels 10/29/2014  . Elevated WBC count 10/29/2014  . Acute confusion due to medical condition 10/27/2014  . Insomnia due to medical condition 10/27/2014  . Acute respiratory failure ([place]) 10/24/2014  . 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[place]) 10/24/2014  . Common femoral artery injury 10/21/2014  . Injury of kidney 10/20/2014  . Cardiogenic shock ([place]) 10/20/2014  . Laceration of liver 10/20/2014  . ST elevation myocardial infarction (STEMI) ([place]) 10/20/2014  . STEMI (ST elevation myocardial infarction) ([place]) 10/19/2014  . Pulmonary edema 10/19/2014  . Liver laceration 10/19/2014  . History of blood transfusion 09/30/2014  . Diabetes mellitus ([place]) 04/19/2013   Past Medical History:  Diagnosis Date  . Breast cancer ([place])   . Cancer ([place])   . Coronary artery disease   . Depression   . DVT (deep venous thrombosis) ([place]) 11/15   left femerol artery injury during resusciation.  [name] GERD (gastroesophageal reflux disease)   . HCAP (healthcare-associated pneumonia) 11/22/2014  . Heart murmur   . High cholesterol   . History of blood transfusion 09/2014   related to MI  . History of gout   . Hypertension   . Liver laceration 10/19/14  . Pulmonary edema 10/19/14  . Renal disorder   . Sleep apnea    "suppose to wear mask but I don't" (11/22/2014)  . STEMI (ST elevation myocardial infarction) ([place]) 10/19/14  . Type II diabetes mellitus ([place])   . Urticaria     Family History  Problem Relation Age of Onset  . Diabetes Mother   . Hypertension Mother   . COPD Mother   . Heart attack Father   . Alzheimer's disease Father   . Diabetes Brother   . Asthma Daughter   . Stroke Neg Hx   . Colon cancer Neg Hx   .  Stomach cancer Neg Hx   . Pancreatic cancer Neg Hx   . AAA (abdominal aortic aneurysm) Neg Hx     Past Surgical History:  Procedure Laterality Date  . ABDOMINAL HYSTERECTOMY  ~ 2000  . APPLICATION OF WOUND VAC  10/2014   "over my naval" (11/22/2014)  . BREAST LUMPECTOMY WITH AXILLARY LYMPH NODE BIOPSY    . BREAST SURGERY    . [place]; 1982  . CORONARY ANGIOPLASTY WITH STENT PLACEMENT  09/2014   "multiple"/notes 11/22/2014  . EXTRACORPOREAL CIRCULATION    . Femerol artery repair Right 09/2014   pt insists it was a right femoral artery repair on 11/22/2014  . LAPAROSCOPIC ABDOMINAL EXPLORATION    . LEFT HEART CATH AND CORONARY ANGIOGRAPHY N/A 12/07/2017   Procedure: LEFT HEART CATH AND CORONARY  ANGIOGRAPHY;  Surgeon: Leonie Man, MD;  Location: [facility];  Service: Cardiovascular;  Laterality: N/A;  . LEFT VENTRICULAR ASSIST DEVICE     pt is not aware of this hx on 11/22/2014  . Liver laceration repair  09/2014   related to CPR/notes 11/22/2014  . TRACHEOSTOMY  09/2014   "closed on it's own when they took it out"  . TUBAL LIGATION  1982   Social History   Occupational History  . Not on file  Tobacco Use  . Smoking status: Never Smoker  . Smokeless tobacco: Never Used  Substance and Sexual Activity  . Alcohol use: Yes    Comment: occ  . Drug use: No  . Sexual activity: Not Currently

## 2019-11-01 NOTE — Patient Instructions (Signed)
    Turmeric:  500 mg twice daily  Ask your heart doctor about Voltaren Gel (available over the counter).  Future options:  - Cortisone injection  - "Gel" injection  - Physical therapy

## 2019-11-18 ENCOUNTER — Other Ambulatory Visit: Payer: Self-pay | Admitting: Cardiovascular Disease

## 2019-11-20 ENCOUNTER — Telehealth: Payer: Self-pay

## 2019-11-20 NOTE — Telephone Encounter (Signed)
Called and tried to lmom to say that the pa was resolved

## 2019-11-27 ENCOUNTER — Encounter: Payer: Self-pay | Admitting: Family Medicine

## 2019-12-12 ENCOUNTER — Telehealth: Payer: Self-pay

## 2019-12-12 ENCOUNTER — Telehealth (INDEPENDENT_AMBULATORY_CARE_PROVIDER_SITE_OTHER): Payer: BC Managed Care – PPO | Admitting: Cardiology

## 2019-12-12 ENCOUNTER — Encounter: Payer: Self-pay | Admitting: Cardiology

## 2019-12-12 VITALS — BP 128/69 | HR 85 | Ht 62.0 in | Wt 194.0 lb

## 2019-12-12 DIAGNOSIS — I2510 Atherosclerotic heart disease of native coronary artery without angina pectoris: Secondary | ICD-10-CM

## 2019-12-12 DIAGNOSIS — I10 Essential (primary) hypertension: Secondary | ICD-10-CM | POA: Diagnosis not present

## 2019-12-12 DIAGNOSIS — Z853 Personal history of malignant neoplasm of breast: Secondary | ICD-10-CM

## 2019-12-12 DIAGNOSIS — Z9861 Coronary angioplasty status: Secondary | ICD-10-CM

## 2019-12-12 DIAGNOSIS — G4733 Obstructive sleep apnea (adult) (pediatric): Secondary | ICD-10-CM

## 2019-12-12 DIAGNOSIS — E785 Hyperlipidemia, unspecified: Secondary | ICD-10-CM

## 2019-12-12 MED ORDER — BIDIL 20-37.5 MG PO TABS: 0.5000 | ORAL_TABLET | Freq: Two times a day (BID) | ORAL | 1 refills | Status: DC

## 2019-12-12 NOTE — Progress Notes (Signed)
Virtual Visit via Video Note   This visit type was conducted due to national recommendations for restrictions regarding the COVID-19 Pandemic (e.g. social distancing) in an effort to limit this patient's exposure and mitigate transmission in our community.  Due to her co-morbid illnesses, this patient is at least at moderate risk for complications without adequate follow up.  This format is felt to be most appropriate for this patient at this time.  All issues noted in this document were discussed and addressed.  A limited physical exam was performed with this format.  Please refer to the patient's chart for her consent to telehealth for [facility].   Date:  12/12/2019   ID:  Marie Tanner, DOB [birth year], MRN [id number]  Patient Location: Home Provider Location: Home  PCP:  Forrest Moron, MD  Cardiologist:  Skeet Latch, MD  Electrophysiologist:  None   Evaluation Performed:  Follow-Up Visit  Chief Complaint:  occasional chest pain  History of Present Illness:    Marie Tanner is a 64 y.o. female with history of coronary disease.  In 2015 she had a ST EMI and cardiac arrest.  She was treated at [facility].  She received an LAD and CFX DES.  Her hospital course was complicated by liver laceration secondary to CPR, DVT, and right femoral artery repair.  In 2017 she had breast cancer treated with surgery and radiation.  She underwent diagnostic catheterization in January 2019.  Her stents were patent but she had moderate residual disease and the plan was for medical therapy.  Her ejection fraction was low at that time at 35 to 45% but a follow-up echocardiogram in August 2019 showed her ejection fraction to be 45 to 50%.  She had a negative treadmill in December 2019.  Other medical issues include hypertension, dyslipidemia with statin intolerance currently on Praluent, non-insulin-dependent diabetes, and sleep apnea, intolerant to CPAP.  Patient was contacted today for follow-up.   She has had some intermittent chest pain that could be angina.  She described an episode 2 to 3 weeks ago of chest pressure while at rest.  She took a nitroglycerin with relief.  She also tells me she had an episode a couple weeks ago where her right leg "gave out".  She was told she had arthritis.  She denies any significant recurrent chest pain.  She says her leg is still a little weak, she is following up with her family doctor about this.  She denies any claudication.  The patient does not have symptoms concerning for COVID-19 infection (fever, chills, cough, or new shortness of breath).    Past Medical History:  Diagnosis Date  . Breast cancer ([place])   . Cancer ([place])   . Coronary artery disease   . Depression   . DVT (deep venous thrombosis) ([place]) 11/15   left femerol artery injury during resusciation.  [name] GERD (gastroesophageal reflux disease)   . HCAP (healthcare-associated pneumonia) 11/22/2014  . Heart murmur   . High cholesterol   . History of blood transfusion 09/2014   related to MI  . History of gout   . Hypertension   . Liver laceration 10/19/14  . Pulmonary edema 10/19/14  . Renal disorder   . Sleep apnea    "suppose to wear mask but I don't" (11/22/2014)  . STEMI (ST elevation myocardial infarction) ([place]) 10/19/14  . Type II diabetes mellitus ([place])   . Urticaria    Past Surgical History:  Procedure Laterality Date  . ABDOMINAL HYSTERECTOMY  ~  2000  . APPLICATION OF WOUND VAC  10/2014   "over my naval" (11/22/2014)  . BREAST LUMPECTOMY WITH AXILLARY LYMPH NODE BIOPSY    . BREAST SURGERY    . [place]; 1982  . CORONARY ANGIOPLASTY WITH STENT PLACEMENT  09/2014   "multiple"/notes 11/22/2014  . EXTRACORPOREAL CIRCULATION    . Femerol artery repair Right 09/2014   pt insists it was a right femoral artery repair on 11/22/2014  . LAPAROSCOPIC ABDOMINAL EXPLORATION    . LEFT HEART CATH AND CORONARY ANGIOGRAPHY N/A 12/07/2017   Procedure: LEFT HEART CATH  AND CORONARY ANGIOGRAPHY;  Surgeon: Leonie Man, MD;  Location: [facility];  Service: Cardiovascular;  Laterality: N/A;  . LEFT VENTRICULAR ASSIST DEVICE     pt is not aware of this hx on 11/22/2014  . Liver laceration repair  09/2014   related to CPR/notes 11/22/2014  . TRACHEOSTOMY  09/2014   "closed on it's own when they took it out"  . TUBAL LIGATION  1982     Current Meds  Medication Sig  . acetaminophen (TYLENOL) 500 MG tablet Take 1,000 mg by mouth every 6 (six) hours as needed.  . Alirocumab (PRALUENT) 150 MG/ML SOAJ Inject 150 mg into the skin every 14 (fourteen) days.  [name] aspirin EC 81 MG tablet Take 1 tablet (81 mg total) by mouth daily.  [name] BIDIL 20-37.5 MG tablet TAKE 1 TABLET BY MOUTH TWICE A DAY  . blood glucose meter kit and supplies KIT by Does not apply route daily as needed. Dispense based on patient and insurance preference. Use up to four times daily as directed. (FOR ICD-9 250.00, 250.01).  . BRILINTA 60 MG TABS tablet TAKE 1 TABLET (60 MG TOTAL) BY MOUTH 2 (TWO) TIMES DAILY.  [name] EPINEPHrine (AUVI-Q) 0.3 mg/0.3 mL IJ SOAJ injection Inject 0.3 mLs (0.3 mg total) into the muscle as needed for anaphylaxis.  [name] exemestane (AROMASIN) 25 MG tablet Take 25 mg by mouth daily after breakfast.   . ezetimibe (ZETIA) 10 MG tablet TAKE 1 TABLET BY MOUTH EVERY DAY  . fluticasone (FLONASE) 50 MCG/ACT nasal spray 2 sprays per nostril at night for stuffy nose at 7pm  . furosemide (LASIX) 40 MG tablet TAKE 1 TABLET AS NEEDED FOR SHORTNESS OF BREATH OR WEIGHT GAIN OF 2 POUNDS IN 24 HOURS OR 5 POUNDS IN 1 WEEK  . glucose blood test strip Use as instructed. E11.5. To check blood glucose daily.  [name] losartan (COZAAR) 25 MG tablet Take 25 mg by mouth as directed. TAKE 1 TABLET BY MOUTH DAILY  . magnesium oxide (MAG-OX) 400 MG tablet TAKE 1 TABLET BY MOUTH EVERY DAY  . metFORMIN (GLUCOPHAGE-XR) 500 MG 24 hr tablet Take 1 tablet (500 mg total) by mouth daily with breakfast.  .  metoprolol succinate (TOPROL-XL) 25 MG 24 hr tablet Take 1 tablet (25 mg total) by mouth daily.  . montelukast (SINGULAIR) 10 MG tablet Take 1 tablet (10 mg total) by mouth at bedtime.  . nitroGLYCERIN (NITROSTAT) 0.4 MG SL tablet Place 1 tablet (0.4 mg total) under the tongue every 5 (five) minutes as needed for chest pain. max of 3 if no relief call 911  . polyethylene glycol (MIRALAX / GLYCOLAX) 17 g packet Take 17 g by mouth daily.  . rosuvastatin (CRESTOR) 20 MG tablet TAKE 1 TABLET BY MOUTH 3 TIMES PER WEEK (Patient taking differently: Take 10 mg by mouth. TAKE HALF TABLET BY MOUTH 3 TIMES PER WEEK)  .  spironolactone (ALDACTONE) 25 MG tablet Take 1 tablet (25 mg total) by mouth daily.  [name] triamcinolone cream (KENALOG) 0.1 % Apply 1 application topically 2 (two) times daily.     Allergies:   Dilaudid [hydromorphone hcl], Hydromorphone, Epinephrine, and Lactose intolerance (gi)   Social History   Tobacco Use  . Smoking status: Never Smoker  . Smokeless tobacco: Never Used  Substance Use Topics  . Alcohol use: Yes    Comment: occ  . Drug use: No     Family Hx: The patient's family history includes Alzheimer's disease in her father; Asthma in her daughter; COPD in her mother; Diabetes in her brother and mother; Heart attack in her father; Hypertension in her mother. There is no history of Stroke, Colon cancer, Stomach cancer, Pancreatic cancer, or AAA (abdominal aortic aneurysm).  ROS:   Please see the history of present illness.    All other systems reviewed and are negative.   Prior CV studies:   The following studies were reviewed today: Cath jan 2019 GXT Dec 2019  Labs/Other Tests and Data Reviewed:    EKG:  An ECG dated 02/08/2019 was personally reviewed today and demonstrated:  NSR, Q in V2  Recent Labs: 10/17/2019: ALT 9; BUN 13; Creatinine, Ser 0.75; Potassium 4.6; Sodium 137   Recent Lipid Panel Lab Results  Component Value Date/Time   CHOL 151 10/17/2019 09:45  AM   TRIG 78 10/17/2019 09:45 AM   HDL 51 10/17/2019 09:45 AM   CHOLHDL 3.0 10/17/2019 09:45 AM   CHOLHDL 3.1 10/05/2016 09:37 AM   LDLCALC 85 10/17/2019 09:45 AM    Wt Readings from Last 3 Encounters:  12/12/19 194 lb (88 kg)  10/17/19 194 lb 12.8 oz (88.4 kg)  09/19/19 197 lb 6.4 oz (89.5 kg)     Objective:    Vital Signs:  BP 128/69   Pulse 85   Ht _0  (1.575 m)   Wt 194 lb (88 kg)   BMI 35.48 kg/m    VITAL SIGNS:  reviewed  ASSESSMENT & PLAN:    CAD- S/p STEMI and cardiac arrest in 2015 ([facility]).  Cath done Jan 2019- medical Rx. GXT Dec 2019 negative  Ischemic cardiomyopathy- EF 45-50% by echo Aug 2019  HLD- On Pralulent and low dose Crestor/Zetia- PCP follows  Untreated sleep apnea-  NIDDM- Followed by PCP  Plan: She complained of a recurring headache after taking BiDil.  I suggested she try 1/2 dose BID.  She needs an OV in 3-4 months.   COVID-19 Education: The signs and symptoms of COVID-19 were discussed with the patient and how to seek care for testing (follow up with PCP or arrange E-visit).  The importance of social distancing was discussed today.  Time:   Today, I have spent 15 minutes with the patient with telehealth technology discussing the above problems.     Medication Adjustments/Labs and Tests Ordered: Current medicines are reviewed at length with the patient today.  Concerns regarding medicines are outlined above.   Tests Ordered: No orders of the defined types were placed in this encounter.   Medication Changes: No orders of the defined types were placed in this encounter.   Follow Up:  In Person 3-4 months with dr Oval Linsey.  Try decreasing her BiDil to 1/2 BID secondary to headaches.  Signed, Kerin Ransom, PA-C  12/12/2019 2:58 PM    [facility]

## 2019-12-12 NOTE — Telephone Encounter (Signed)
Contacted patient to discuss AVS Instructions. Gave patient Marie Tanner's recommendations from today's virtual office visit. Follow up appointment scheduled with Dr Oval Linsey. Patient voiced understanding and AVS mailed.

## 2019-12-12 NOTE — Patient Instructions (Addendum)
Medication Instructions:  DECREASE Bidil to half a tablet twice a day  *If you need a refill on your cardiac medications before your next appointment, please call your pharmacy*  Lab Work: None  If you have labs (blood work) drawn today and your tests are completely normal, you will receive your results only by: [name] MyChart Message (if you have MyChart) OR . A paper copy in the mail If you have any lab test that is abnormal or we need to change your treatment, we will call you to review the results.  Testing/Procedures: None   Follow-Up: At [facility], you and your health needs are our priority.  As part of our continuing mission to provide you with exceptional heart care, we have created designated Provider Care Teams.  These Care Teams include your primary Cardiologist (physician) and Advanced Practice Providers (APPs -  Physician Assistants and Nurse Practitioners) who all work together to provide you with the care you need, when you need it.  Your next appointment:   3-4 month(s)  The format for your next appointment:   In Person  Provider:   Skeet Latch, MD   Other Instructions

## 2019-12-12 NOTE — Telephone Encounter (Signed)

## 2019-12-22 ENCOUNTER — Other Ambulatory Visit: Payer: Self-pay | Admitting: Family Medicine

## 2019-12-22 NOTE — Telephone Encounter (Signed)
Requested medication (s) are due for refill today: yes  Requested medication (s) are on the active medication list: yes  Last refill:  10/31/2018  Future visit scheduled: yes  Notes to clinic:   Last ordered: 10 months ago by Historical Provider, MD  Requested Prescriptions  Pending Prescriptions Disp Refills   losartan (COZAAR) 25 MG tablet [Pharmacy Med Name: LOSARTAN POTASSIUM 25 MG TAB] 90 tablet 1    Sig: TAKE 1 TABLET BY MOUTH EVERY DAY      Cardiovascular:  Angiotensin Receptor Blockers Passed - 12/22/2019 11:09 AM      Passed - Cr in normal range and within 180 days    Creat  Date Value Ref Range Status  10/05/2016 0.61 0.50 - 1.05 mg/dL Final    Comment:      For patients > or = 62 years of age: The upper reference limit for Creatinine is approximately 13% higher for people identified as African-American.      Creatinine, Ser  Date Value Ref Range Status  10/17/2019 0.75 0.57 - 1.00 mg/dL Final          Passed - K in normal range and within 180 days    Potassium  Date Value Ref Range Status  10/17/2019 4.6 3.5 - 5.2 mmol/L Final          Passed - Patient is not pregnant      Passed - Last BP in normal range    BP Readings from Last 1 Encounters:  12/12/19 128/69          Passed - Valid encounter within last 6 months    Recent Outpatient Visits           2 months ago Diabetes with skin complication ([place])   Primary Care at [place], MD   3 months ago Insect bite of right upper arm with local reaction, initial encounter   Primary Care at [facility], Arlie Solomons, MD   3 months ago Cellulitis of right upper extremity   Primary Care at [facility] [place] [facility], MD   5 months ago Diabetes with skin complication [facility])   Primary Care at [place], MD   8 months ago Other constipation   Primary Care at [facility], Arlie Solomons, MD       Future Appointments             In 3 weeks Forrest Moron, MD Primary Care at [place],  [facility]   In 2 months Skeet Latch, MD [place], [place]   In 3 months [place], Jens Som, MD Allergy and [place]

## 2020-01-12 ENCOUNTER — Telehealth: Payer: Self-pay | Admitting: Family Medicine

## 2020-01-12 NOTE — Telephone Encounter (Signed)
Pt is wanting to speak to [facility] before her appt on Tuesday [phone]

## 2020-01-12 NOTE — Telephone Encounter (Signed)
Called pt, would rather speak to you

## 2020-01-15 ENCOUNTER — Other Ambulatory Visit: Payer: Self-pay

## 2020-01-15 DIAGNOSIS — E1165 Type 2 diabetes mellitus with hyperglycemia: Secondary | ICD-10-CM

## 2020-01-15 NOTE — Telephone Encounter (Signed)
Spoke wit pt and scheduled for nurse visit for 01/19/2020 at 9:20 am for vitals and A!C check.  Pt agreeable.

## 2020-01-16 ENCOUNTER — Ambulatory Visit: Payer: BC Managed Care – PPO | Admitting: Family Medicine

## 2020-01-19 ENCOUNTER — Ambulatory Visit: Payer: Self-pay

## 2020-01-22 ENCOUNTER — Ambulatory Visit (INDEPENDENT_AMBULATORY_CARE_PROVIDER_SITE_OTHER): Payer: Self-pay | Admitting: Family Medicine

## 2020-01-22 ENCOUNTER — Other Ambulatory Visit: Payer: Self-pay

## 2020-01-22 DIAGNOSIS — E1165 Type 2 diabetes mellitus with hyperglycemia: Secondary | ICD-10-CM

## 2020-01-22 LAB — POCT GLYCOSYLATED HEMOGLOBIN (HGB A1C): Hemoglobin A1C: 7 % — AB (ref 4.0–5.6)

## 2020-01-22 NOTE — Progress Notes (Signed)
Patient here for nurse visit.

## 2020-02-28 ENCOUNTER — Telehealth: Payer: Self-pay | Admitting: Cardiovascular Disease

## 2020-02-28 MED ORDER — FUROSEMIDE 40 MG PO TABS: ORAL_TABLET | ORAL | 3 refills | Status: DC

## 2020-02-28 MED ORDER — EZETIMIBE 10 MG PO TABS: 10.0000 mg | ORAL_TABLET | Freq: Every day | ORAL | 11 refills | Status: DC

## 2020-02-28 NOTE — Telephone Encounter (Signed)
CSW received referral to assist patient with insurance and medication assistance. Patient reports she has been receiving disability from the state for 3 years now and due to recent denial from [place] her state disability benefits were terminated. Patient lives with her sister and has a very supportive son who has assisted with some expenses as she currently has no income. 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  She also reports she has food stamps. Patient reports she has most of her medications at the moment but will need refills over the next few weeks. She has an application for patient assistance for Brilinta and will complete her part and drop off to the [facility] for completion by Dr. Oval Linsey. CSW discussed the importance of maintaining her appointments with the providers and possible retro payment if medicaid is approved. CSW discussed option to discuss further with our financial counseling office about alternatives for the self pay. CSW also discussed multiple options for medication assistance including good Rx card and support from the Patient Care Fund. Patient will stay in communication with CSW as continue to navigate her medical and medication needs. Raquel Sarna, [place], [place]

## 2020-02-28 NOTE — Telephone Encounter (Signed)
New Message  Patient is calling in to speak with Dr. Oval Linsey or her nurse. States that she has lost her insurance and needs all of her medication called in to [facility] med assist. Patient is needing this as soon as possible. [facility] number listed below and patient can be reached on her mobile number. Please assist.   [facility] [phone]

## 2020-02-28 NOTE — Telephone Encounter (Signed)
Spoke with Seth Bake at [facility]. Patient is not active in their system yet but we can go ahead and escribe the medications so that once she is active they can fill her prescriptions. The only medications [facility] covers are Zetia and Furosemide.   Spoke with patient and updated her.   Patient has lost her disability income and has no insurance.   Patient is wanting to cancel her April 13th appointment because she cannot afford to pay out of pocket. Will route message to SW Ms. Tobe Sos to see if patient qualifies for patient assistance or what options are available for patient before the appointment is cancelled.

## 2020-02-29 ENCOUNTER — Telehealth: Payer: Self-pay

## 2020-02-29 NOTE — Telephone Encounter (Signed)
Called pt to clarify CRM re: needing help with assistance with medications/sample.  Pt advises she needs new-- rx's faxed to [facility] at [phone].  Scripts written out for signature by stallings and placed in her red folder.

## 2020-03-04 ENCOUNTER — Telehealth: Payer: Self-pay | Admitting: *Deleted

## 2020-03-04 NOTE — Telephone Encounter (Signed)
Faxed Rxs to [facility] at [phone], prescriptions Losartan 25 mg, Magnesium Oxide 400 mg, Metformin 500 mg, Metoprolol 25 mg, Montelukast 10 mg, Triamcinolone cream 0.1%. Confirmation page 4:50 pm.

## 2020-03-04 NOTE — Telephone Encounter (Signed)
Signed and handed to [place]

## 2020-03-05 ENCOUNTER — Telehealth: Payer: Self-pay

## 2020-03-05 ENCOUNTER — Other Ambulatory Visit: Payer: Self-pay | Admitting: Family Medicine

## 2020-03-05 DIAGNOSIS — S50361A Insect bite (nonvenomous) of right elbow, initial encounter: Secondary | ICD-10-CM

## 2020-03-05 MED ORDER — LOSARTAN POTASSIUM 25 MG PO TABS: 25.0000 mg | ORAL_TABLET | Freq: Every day | ORAL | 1 refills | Status: DC

## 2020-03-05 MED ORDER — TRIAMCINOLONE ACETONIDE 0.1 % EX CREA: 1.0000 "application " | TOPICAL_CREAM | Freq: Two times a day (BID) | CUTANEOUS | 3 refills | Status: DC

## 2020-03-05 MED ORDER — METFORMIN HCL ER 500 MG PO TB24: 500.0000 mg | ORAL_TABLET | Freq: Every day | ORAL | 1 refills | Status: DC

## 2020-03-05 MED ORDER — MONTELUKAST SODIUM 10 MG PO TABS: 10.0000 mg | ORAL_TABLET | Freq: Every day | ORAL | 3 refills | Status: DC

## 2020-03-05 NOTE — Telephone Encounter (Signed)
Tried contacting pt via phone at [phone] and no answer and no vm to leave a message. Reason for call:  Calling to advise pt [facility] does not carry magnesium oxide or metoprolol.  The magnesium oxide can be bought over the counter and the metoprolol 25 mg with good rx will cost $13.  I have the original scripts for meds or I can send electronically the metoprolol to her local pharmacy.

## 2020-03-05 NOTE — Telephone Encounter (Signed)
On 03/04/2020, prescriptions were faxed to [facility] and confirmation page at 5:21 pm. SEE TELEPHONE DOCUMENTATION for details.

## 2020-03-08 ENCOUNTER — Other Ambulatory Visit: Payer: Self-pay | Admitting: Physician Assistant

## 2020-03-10 ENCOUNTER — Other Ambulatory Visit: Payer: Self-pay | Admitting: Cardiovascular Disease

## 2020-03-12 ENCOUNTER — Ambulatory Visit (INDEPENDENT_AMBULATORY_CARE_PROVIDER_SITE_OTHER): Payer: Self-pay | Admitting: Cardiovascular Disease

## 2020-03-12 ENCOUNTER — Other Ambulatory Visit: Payer: Self-pay

## 2020-03-12 ENCOUNTER — Encounter: Payer: Self-pay | Admitting: Cardiovascular Disease

## 2020-03-12 VITALS — BP 134/86 | HR 81 | Ht 62.5 in | Wt 197.2 lb

## 2020-03-12 DIAGNOSIS — I240 Acute coronary thrombosis not resulting in myocardial infarction: Secondary | ICD-10-CM

## 2020-03-12 DIAGNOSIS — Z9861 Coronary angioplasty status: Secondary | ICD-10-CM

## 2020-03-12 DIAGNOSIS — I2510 Atherosclerotic heart disease of native coronary artery without angina pectoris: Secondary | ICD-10-CM

## 2020-03-12 DIAGNOSIS — E785 Hyperlipidemia, unspecified: Secondary | ICD-10-CM

## 2020-03-12 DIAGNOSIS — I10 Essential (primary) hypertension: Secondary | ICD-10-CM

## 2020-03-12 DIAGNOSIS — I259 Chronic ischemic heart disease, unspecified: Secondary | ICD-10-CM

## 2020-03-12 MED ORDER — ISOSORBIDE MONONITRATE ER 30 MG PO TB24: 30.0000 mg | ORAL_TABLET | Freq: Every day | ORAL | 3 refills | Status: DC

## 2020-03-12 MED ORDER — HYDRALAZINE HCL 25 MG PO TABS: 25.0000 mg | ORAL_TABLET | Freq: Two times a day (BID) | ORAL | 1 refills | Status: DC

## 2020-03-12 NOTE — Telephone Encounter (Addendum)
Faxed papers for [employer] PA papers

## 2020-03-12 NOTE — Patient Instructions (Signed)
Medication Instructions:  STOP BIDIL   START HYDRALAZINE 25 MG TWICE A DAY  START ISOSORBIDE 30 MG DAILY   *If you need a refill on your cardiac medications before your next appointment, please call your pharmacy*  Lab Work: NONE   Testing/Procedures: NONE   Follow-Up: At [facility], you and your health needs are our priority.  As part of our continuing mission to provide you with exceptional heart care, we have created designated Provider Care Teams.  These Care Teams include your primary Cardiologist (physician) and Advanced Practice Providers (APPs -  Physician Assistants and Nurse Practitioners) who all work together to provide you with the care you need, when you need it.  We recommend signing up for the patient portal called "MyChart".  Sign up information is provided on this After Visit Summary.  MyChart is used to connect with patients for Virtual Visits (Telemedicine).  Patients are able to view lab/test results, encounter notes, upcoming appointments, etc.  Non-urgent messages can be sent to your provider as well.   To learn more about what you can do with MyChart, go to [email or web address].    Your next appointment:   4 week(s)  The format for your next appointment:   Virtual Visit   Provider:   You may see Skeet Latch, MD or one of the following Advanced Practice Providers on your designated Care Team:    Kerin Ransom, PA-C  [place], [place]  Coletta Memos, 

## 2020-03-12 NOTE — Progress Notes (Signed)
Cardiology Office Note   Date:  03/12/2020   ID:  Marie Tanner, DOB 22-Feb-1956, MRN [id number]  PCP:  Forrest Moron, MD  Cardiologist:   Skeet Latch, MD  Oncologist: Samuel Bouche, MD Radiation Oncologist: Ladona Horns, MD Surgical Oncologist: Nancie Neas, MD  No chief complaint on file.   History of Present Illness: Marie Tanner is a 64 y.o. female with CAD s/p STEMI, hypertension, chronic systolic and diastolic heart failure, OSA not on CPAP, diabetes, prior DVT, breast cancer, and mitral regurgitation who presents for follow up.  In 2015 she had a STEMI and cardiac arrest. She was cared for at [facility]. She ultimately received 2 DES (LM/LAD and LCx).  She had a prolonged hospitalization requiring tracheostomy.  Her liver was lacerated during CPR.  She required surgical repair after femoral catheterization.  She also developed a DVT in the hospital.  One week after discharge she developed pneumonia and was admitted at [facility].  She has been seen in the hospital twice for chest pain and ruled out for MI.  She had a negative Myoview 05/2015.  She also had negative CT scans for PE.  Marie Tanner also underwent surgery for breast cancer on 02/21/16.  She completed her radiation in September 2017.  She was referred for sleep study which was abnormal.  However, she declined using the CPAP device and is in the process of being fitted for an oral airway.  Marie Tanner had an echo 06/2016 that showed LVEF 50-55% with grade 1 diastolic dysfunction.    Marie Tanner reported an episode of chest pain that occurred while driving.  She was referred for [facility] 11/24/17 that revealed LVEF 36% with anterior, anteroseptal, apical inferior and apical lateral defects.  There was concern for infarct with peri-infarct ischemia in the inferior and apical walls.  She underwent LHC 11/2017 that revealed patent stents that were heavily calcified and there was a step down after the takeoff of  the diagonal.  She had 40% RI, 40% proximal LCx and 20% prox LAD.  LVEF was 35-45% and LVEDP was elevated.  BiDil was added to her regimen for afterload reduction and poorly controlled hypertension.  She reported increased dyspnea on exertion and edema.  Lasix was increased for 2 days.  She was also referred for an echocardiogram 06/2018 that revealed LVEF 45 to 50% with hypokinesis of the mid to apical inferior and inferoseptal myocardium that was relatively unchanged from prior.  She was also started on spironolactone.  She had atypical chest pain and was referred for an ETT 10/2018 that was negative.  She achieved 6.2 METS on a Bruce protocol.    At her last appointment Marie Tanner reported episodes of dizziness.  Losartan was reduced to 12.5 mg she also had persistent episodes of chest discomfort that were atypical and occurred when lying down.  Thought to be secondary to GERD and her PPI was restarted.  Since her last appointment she has been struggling.  Few weeks ago she had some chest discomfort.  She is also had pain in her left foot that was similar to when she had her prior heart attack.  She notes that she has been having difficulty affording her medications lately.  She has not been on her BiDil or Brilinta or Praluent for over a month.  She notes that her blood pressure has been pretty stable.  Its been mostly in the 130s to 140s over 70s to 80s.  She is not  getting much exercise.  She plans to start walking more now that the weather is improving.  She notes that her sister moved to [place] and has been very helpful to her lately.  She has been working with our social work team and is in the process of trying to get Medicaid.  Currently she is without insurance.  Past Medical History:  Diagnosis Date  . Breast cancer ([place])   . Cancer ([place])   . Coronary artery disease   . Depression   . DVT (deep venous thrombosis) ([place]) 11/15   left femerol artery injury during resusciation.  [name] GERD  (gastroesophageal reflux disease)   . HCAP (healthcare-associated pneumonia) 11/22/2014  . Heart murmur   . High cholesterol   . History of blood transfusion 09/2014   related to MI  . History of gout   . Hypertension   . Liver laceration 10/19/14  . Pulmonary edema 10/19/14  . Renal disorder   . Sleep apnea    "suppose to wear mask but I don't" (11/22/2014)  . STEMI (ST elevation myocardial infarction) ([place]) 10/19/14  . Type II diabetes mellitus ([place])   . Urticaria     Past Surgical History:  Procedure Laterality Date  . ABDOMINAL HYSTERECTOMY  ~ 2000  . APPLICATION OF WOUND VAC  10/2014   "over my naval" (11/22/2014)  . BREAST LUMPECTOMY WITH AXILLARY LYMPH NODE BIOPSY    . BREAST SURGERY    . [place]; 1982  . CORONARY ANGIOPLASTY WITH STENT PLACEMENT  09/2014   "multiple"/notes 11/22/2014  . EXTRACORPOREAL CIRCULATION    . Femerol artery repair Right 09/2014   pt insists it was a right femoral artery repair on 11/22/2014  . LAPAROSCOPIC ABDOMINAL EXPLORATION    . LEFT HEART CATH AND CORONARY ANGIOGRAPHY N/A 12/07/2017   Procedure: LEFT HEART CATH AND CORONARY ANGIOGRAPHY;  Surgeon: Leonie Man, MD;  Location: [facility];  Service: Cardiovascular;  Laterality: N/A;  . LEFT VENTRICULAR ASSIST DEVICE     pt is not aware of this hx on 11/22/2014  . Liver laceration repair  09/2014   related to CPR/notes 11/22/2014  . TRACHEOSTOMY  09/2014   "closed on it's own when they took it out"  . TUBAL LIGATION  1982     Current Outpatient Medications  Medication Sig Dispense Refill  . acetaminophen (TYLENOL) 500 MG tablet Take 1,000 mg by mouth every 6 (six) hours as needed.    . Alirocumab (PRALUENT) 150 MG/ML SOAJ Inject 150 mg into the skin every 14 (fourteen) days. 2 pen 12  . aspirin EC 81 MG tablet Take 1 tablet (81 mg total) by mouth daily.    . blood glucose meter kit and supplies KIT by Does not apply route daily as needed. Dispense based on  patient and insurance preference. Use up to four times daily as directed. (FOR ICD-9 250.00, 250.01).    . BRILINTA 60 MG TABS tablet TAKE 1 TABLET (60 MG TOTAL) BY MOUTH 2 (TWO) TIMES DAILY. 180 tablet 2  . EPINEPHrine (AUVI-Q) 0.3 mg/0.3 mL IJ SOAJ injection Inject 0.3 mLs (0.3 mg total) into the muscle as needed for anaphylaxis. 1 each 1  . exemestane (AROMASIN) 25 MG tablet Take 25 mg by mouth daily after breakfast.     . ezetimibe (ZETIA) 10 MG tablet Take 1 tablet (10 mg total) by mouth daily. 30 tablet 11  . fluticasone (FLONASE) 50 MCG/ACT nasal spray 2 sprays per nostril at  night for stuffy nose at 7pm 18.2 mL 5  . furosemide (LASIX) 40 MG tablet TAKE 1 TABLET AS NEEDED FOR SHORTNESS OF BREATH OR WEIGHT GAIN OF 2 POUNDS IN 24 HOURS OR 5 POUNDS IN 1 WEEK 30 tablet 3  . glucose blood test strip Use as instructed. E11.5. To check blood glucose daily. 100 each 12  . hydrALAZINE (APRESOLINE) 25 MG tablet Take 1 tablet (25 mg total) by mouth in the morning and at bedtime. 180 tablet 1  . isosorbide mononitrate (IMDUR) 30 MG 24 hr tablet Take 1 tablet (30 mg total) by mouth daily. 90 tablet 3  . losartan (COZAAR) 25 MG tablet Take 1 tablet (25 mg total) by mouth daily. 90 tablet 1  . magnesium oxide (MAG-OX) 400 MG tablet TAKE 1 TABLET BY MOUTH EVERY DAY 90 tablet 0  . metFORMIN (GLUCOPHAGE-XR) 500 MG 24 hr tablet Take 1 tablet (500 mg total) by mouth daily with breakfast. 90 tablet 1  . metoprolol succinate (TOPROL-XL) 25 MG 24 hr tablet Take 1 tablet (25 mg total) by mouth daily. 30 tablet 11  . montelukast (SINGULAIR) 10 MG tablet Take 1 tablet (10 mg total) by mouth at bedtime. 90 tablet 3  . nitroGLYCERIN (NITROSTAT) 0.4 MG SL tablet Place 1 tablet (0.4 mg total) under the tongue every 5 (five) minutes as needed for chest pain. max of 3 if no relief call 911 25 tablet PRN  . polyethylene glycol (MIRALAX / GLYCOLAX) 17 g packet Take 17 g by mouth daily.    . rosuvastatin (CRESTOR) 20 MG tablet  TAKE 1 TABLET BY MOUTH 3 TIMES PER WEEK (Patient taking differently: Take 10 mg by mouth. TAKE HALF TABLET BY MOUTH 3 TIMES PER WEEK) 30 tablet 1  . spironolactone (ALDACTONE) 25 MG tablet TAKE 1 TABLET BY MOUTH EVERY DAY 90 tablet 2  . triamcinolone cream (KENALOG) 0.1 % Apply 1 application topically 2 (two) times daily. 80 g 3   No current facility-administered medications for this visit.    Allergies:   Dilaudid [hydromorphone hcl], Hydromorphone, Epinephrine, and Lactose intolerance (gi)    Social History:  The patient  reports that she has never smoked. She has never used smokeless tobacco. She reports current alcohol use. She reports that she does not use drugs.   Family History:  The patient's family history includes Alzheimer's disease in her father; Asthma in her daughter; COPD in her mother; Diabetes in her brother and mother; Heart attack in her father; Hypertension in her mother.    ROS:  Please see the history of present illness.   Otherwise, review of systems are positive for none.   All other systems are reviewed and negative.    PHYSICAL EXAM: VS:  BP 134/86   Pulse 81   Ht 5' 2.5" (1.588 m)   Wt 197 lb 3.2 oz (89.4 kg)   SpO2 98%   BMI 35.49 kg/m  , BMI Body mass index is 35.49 kg/m. GENERAL:  Well appearing HEENT: Pupils equal round and reactive, fundi not visualized, oral mucosa unremarkable NECK:  No jugular venous distention, waveform within normal limits, carotid upstroke brisk and symmetric, no bruits LUNGS:  Clear to auscultation bilaterally HEART:  RRR.  PMI not displaced or sustained, S1 and S2 within normal limits, no S3, no S4, no clicks, no rubs, III/VI systolic murmur anteriorly ABD:  Flat, positive bowel sounds normal in frequency in pitch, no bruits, no rebound, no guarding, no midline pulsatile mass, no hepatomegaly, no  splenomegaly EXT:  2 plus pulses throughout, no edema, no cyanosis no clubbing SKIN:  No rashes no nodules NEURO:  Cranial nerves  II through XII grossly intact, motor grossly intact throughout PSYCH:  Cognitively intact, oriented to person place and time   EKG:  EKG is ordered today. 02/11/16: Sinus arrhythmia.  Rate 71 bpm. Non-specific ST-T changes. 10/05/16: Sinus rhythm. Rate 85 bpm. Prior inferior infarct. 11/16/17: Sinus rhythm.  Rate 87 bpm.  Prior inferior infarct.  07/22/18: Sinu srhythm.  Rate 90 bpm.  Prior inferior infarct.   02/08/2019: Sinus rhythm.  Rate 80 bpm.  Prior inferior infarct. 03/12/2020: Sinus rhythm.  Rate 81 bpm.  Prior inferior infarct.  07/26/18: Study Conclusions  - Left ventricle: The cavity size was normal. Systolic function was   mildly reduced. The estimated ejection fraction was in the range   of 45% to 50%. Hypokinesis of the mid-apicalinferior and   inferoseptal myocardium. Doppler parameters are consistent with   abnormal left ventricular relaxation (grade 1 diastolic   dysfunction). Acoustic contrast opacification revealed no   evidence ofthrombus. - Mitral valve: There was moderate regurgitation directed   eccentrically and anteriorly. - Left atrium: The atrium was mildly dilated. - Atrial septum: No defect or patent foramen ovale was identified.  Impressions:  - Direct comparison to images from August 2917 shows no change in   wall motion pattern or overall LV EF. [name]  Lexiscan Myoview 11/24/17:  The left ventricular ejection fraction is moderately decreased (30-44%).  Nuclear stress EF: 36%.  There was no ST segment deviation noted during stress.  There is a large defect of severe severity present in the mid anterior, mid inferior, apical anterior, apical septal, apical inferior, apical lateral and apex location. The defect is partially reversible. This is consistent with infarct with small amount of peri infarct ischemia in the inferior and apical walls.  This is a high risk study.  Compared to prior study the EF has declined and there appears to be peri  infarct ischemia in the inferior wall although this could also be variations in diaphragmatic attenuation artifact.   LHC 12/07/17:  Mid LM to Ost LAD DES Stent is 10% stenosed. -This entire segment is heavily calcified. There is a significant step down after the stent at the takeoff of the diagonal branches.  Prox LAD lesion is 20% stenosed.  Ost Ramus lesion is 40% stenosed. Proc Circumflex ~40%.  There is moderate left ventricular systolic dysfunction. The left ventricular ejection fraction is 35-45% by visual estimate.  LV end diastolic pressure is moderately elevated. 22-24 mmHg  ETT 10/2018: No ischemia.  6.2 METS on Bruce protocol..   Recent Labs: 10/17/2019: ALT 9; BUN 13; Creatinine, Ser 0.75; Potassium 4.6; Sodium 137    Lipid Panel    Component Value Date/Time   CHOL 151 10/17/2019 0945   TRIG 78 10/17/2019 0945   HDL 51 10/17/2019 0945   CHOLHDL 3.0 10/17/2019 0945   CHOLHDL 3.1 10/05/2016 0937   VLDL 15 10/05/2016 0937   LDLCALC 85 10/17/2019 0945      Wt Readings from Last 3 Encounters:  03/12/20 197 lb 3.2 oz (89.4 kg)  01/22/20 200 lb 6.4 oz (90.9 kg)  12/12/19 194 lb (88 kg)      ASSESSMENT AND PLAN:  # CAD s/p STEMI: # Chronic angina: Marie Tanner had an ST elevation myocardial infarction with stents placed in the LAD/left main and left circumflex arteries.  She is on low dose ticagrelor indefinitely.  We will help her with the patient assistance program so that she can resume this medication.  LHC 11/2017 showed patent stents with 20-40% lesions.  ETT ws negative.  She has been having more chest discomfort lately.  This may be attributable to her being off BiDil.  She has not been able to afford this medication.  We will switch her to Imdur 30 mg daily and hydralazine 25 mg twice a day.  Continue aspirin Zetia, Praluent, metoprolol, ticagrelor, and rosuvastatin.  She continues to have symptoms after resuming her medication regimen we will need to  get a cath.  # Chronic systolic and diastolic heart failure: # Moderate mitral regurgitation: LVEF 45-50%.  She is euvolemic but has had some lower extremity edema.  Ultimately would be good to repeat her echo.  We will plan to do this when she gets her insurance.  Continue her antihypertensive regimen and Lasix for now.  And doing well.  Continue losartan, Bidil, metoprolol, and spironolactone.    # Hypertensive heart disease: BP is above goal but she has been off BiDil.  Switching to hydralazine and Imdur as above.  Otherwise continue current antihypertensive regimen.  # Hyperlipidemia:  Rosuvastatin was reduced due to muscle cramping.  Lipids were well controlled but LDL was above goal on the last check.  She has been taking Praluent sporadically due to cost.  We are in the process of working with this patient assistance program.  # OSA: Oral airway device.    Time spent: 35 minutes-Greater than 50% of this time was spent in counseling, explanation of diagnosis, planning of further management, and coordination of care.  Current medicines are reviewed at length with the patient today.  The patient does not have concerns regarding medicines.  The following changes have been made: none  Labs/ tests ordered today include:   Orders Placed This Encounter  Procedures  . EKG 12-Lead     Disposition:   FU with Wilbur [place] C. Oval Linsey, MD, [facility] in 1 month virtually    Signed, Yessika Otte C. Oval Linsey, MD, [facility]  03/12/2020 1:20 PM    [facility]

## 2020-04-02 ENCOUNTER — Ambulatory Visit: Payer: BC Managed Care – PPO | Admitting: Pediatrics

## 2020-04-04 ENCOUNTER — Other Ambulatory Visit: Payer: Self-pay | Admitting: Family Medicine

## 2020-04-04 ENCOUNTER — Other Ambulatory Visit: Payer: Self-pay | Admitting: Physician Assistant

## 2020-04-04 NOTE — Telephone Encounter (Signed)
Requested Prescriptions  Pending Prescriptions Disp Refills  . magnesium oxide (MAG-OX) 400 MG tablet [Pharmacy Med Name: MAGNESIUM OXIDE 400 MG TABLET] 90 tablet 0    Sig: TAKE 1 TABLET BY MOUTH EVERY DAY     Endocrinology:  Minerals - Magnesium Supplementation Failed - 04/04/2020  9:52 AM      Failed - Mg Level in normal range and within 360 days    No results found for: MG       Passed - Valid encounter within last 12 months    Recent Outpatient Visits          2 months ago Type 2 diabetes mellitus with hyperglycemia, without long-term current use of insulin ([place])   Primary Care at [place], MD   5 months ago Diabetes with skin complication [facility])   Primary Care at [facility], Zoe A, MD   6 months ago Insect bite of right upper arm with local reaction, initial encounter   Primary Care at [facility], Zoe A, MD   7 months ago Cellulitis of right upper extremity   Primary Care at [facility] [place] A, MD   8 months ago Diabetes with skin complication [facility])   Primary Care at [facility], Arlie Solomons, MD      Future Appointments            In 5 days Cleaver, Jossie Ng, NP [facility], [facility]           . metFORMIN (GLUCOPHAGE-XR) 500 MG 24 hr tablet [Pharmacy Med Name: METFORMIN HCL ER 500 MG TABLET] 90 tablet 1    Sig: TAKE 1 TABLET BY [place]     Endocrinology:  Diabetes - Biguanides Passed - 04/04/2020  9:52 AM      Passed - Cr in normal range and within 360 days    Creat  Date Value Ref Range Status  10/05/2016 0.61 0.50 - 1.05 mg/dL Final    Comment:      For patients > or = 62 years of age: The upper reference limit for Creatinine is approximately 13% higher for people identified as African-American.      Creatinine, Ser  Date Value Ref Range Status  10/17/2019 0.75 0.57 - 1.00 mg/dL Final         Passed - HBA1C is between 0 and 7.9 and within 180 days    Hemoglobin A1C  Date Value Ref Range Status   01/22/2020 7.0 (A) 4.0 - 5.6 % Final   Hgb A1c MFr Bld  Date Value Ref Range Status  10/28/2017 6.3 (H) 4.8 - 5.6 % Final    Comment:             Prediabetes: 5.7 - 6.4          Diabetes: >6.4          Glycemic control for adults with diabetes: <7.0          Passed - eGFR in normal range and within 360 days    GFR calc Af Amer  Date Value Ref Range Status  10/17/2019 99 >59 mL/min/1.73 Final   GFR calc non Af Amer  Date Value Ref Range Status  10/17/2019 86 >59 mL/min/1.73 Final         Passed - Valid encounter within last 6 months    Recent Outpatient Visits          2 months ago Type 2 diabetes mellitus with hyperglycemia, without long-term  current use of insulin ([place])   Primary Care at [facility], [place], MD   5 months ago Diabetes with skin complication [facility])   Primary Care at [facility], [place], MD   6 months ago Insect bite of right upper arm with local reaction, initial encounter   Primary Care at [facility], [place], MD   7 months ago Cellulitis of right upper extremity   Primary Care at [facility], [place], MD   8 months ago Diabetes with skin complication [facility])   Primary Care at [facility], Arlie Solomons, MD      Future Appointments            In 5 days Cleaver, Jossie Ng, NP [place], [facility]

## 2020-04-07 NOTE — Progress Notes (Signed)
Virtual Visit via Video Note   This visit type was conducted due to national recommendations for restrictions regarding the COVID-19 Pandemic (e.g. social distancing) in an effort to limit this patient's exposure and mitigate transmission in our community.  Due to her co-morbid illnesses, this patient is at least at moderate risk for complications without adequate follow up.  This format is felt to be most appropriate for this patient at this time.  All issues noted in this document were discussed and addressed.  A limited physical exam was performed with this format.  Please refer to the patient's chart for her consent to telehealth for [facility]  Evaluation Performed:  Follow-up visit  This visit type was conducted due to national recommendations for restrictions regarding the COVID-19 Pandemic (e.g. social distancing).  This format is felt to be most appropriate for this patient at this time.  All issues noted in this document were discussed and addressed.  No physical exam was performed (except for noted visual exam findings with Video Visits).  Please refer to the patient's chart (MyChart message for video visits and phone note for telephone visits) for the patient's consent to telehealth for [place]  Date:  04/08/2020   ID:  Marie Tanner, DOB 1956-03-19, MRN [id number]  Patient Location:  [place] [address]   Provider location:     [address] Office [phone] Fax [phone]   PCP:  Forrest Moron, MD  Cardiologist:  Skeet Latch, MD  Electrophysiologist:  None   Chief Complaint: Follow-up for essential hypertension  History of Present Illness:    Marie Tanner is a 64 y.o. female who presents via audio/video conferencing for a telehealth visit today.  Patient verified DOB and address.  She has a past medical history of coronary artery disease status  post STEMI, hypertension, chronic systolic and diastolic CHF, obstructive sleep apnea on CPAP, diabetes, prior DVT, breast cancer, and mitral valve regurgitation.  In 2005 she had a STEMI and cardiac arrest.  She received care at [facility].  She underwent cardiac catheterization and received DES x2 to her LAD and circumflex.  Her hospitalization was prolonged and required a tracheostomy.  Her liver had been lacerated during CPR.  She required surgical repair after her femoral catheterization.  She also developed a DVT during her hospitalization.  1 week following her discharge she developed pneumonia and was admitted to [facility].  She was noted to have been seen in the hospital twice for chest pain and ruled out for MI.  Her nuclear stress test 7/16 was negative.  She had a negative CT scan for PE.  She underwent surgery for breast cancer 02/21/2016.  She completed her radiation 9/17.  She was referred for sleep study which was abnormal.  She declined the use of CPAP device and underwent fitting for an oral airway.  She had an echocardiogram 8/17 that showed an LVEF of 50-55% with G1 DD.  She reported an episode of chest discomfort while she was driving.  She was referred for nuclear stress test 12/18.  It showed a LVEF of 36% with anterior, anterioseptal, apical inferior and apical lateral defects.  There was concern for infarct with peri-infarct ischemia in the inferior and apical walls.  She underwent cardiac catheterization 1/19 that showed patent stents that were heavily calcified.  She had 40% RI, 40% proximal circumflex and 20% proximal LAD.  Her LVEF was 35-45  and LVEDP was elevated.  BiDil was then added to her medication regimen for afterload reduction and poorly controlled HTN.  She reported increased DOE and edema.  Her Lasix was increased for 2 days.  Her echocardiogram 8/19 showed an LVEF of 45-50% with hypokinesis of the mid to apical inferior and inferior septal myocardium.  She was started on spironolactone.   She had atypical chest pain and was referred for ETT 12/19 that was negative.  She was able to achieve 6.2 METS on a Bruce protocol.  She followed up with Dr. Oval Linsey and she indicated that she was having episodes of dizziness.  Her losartan was reduced to 12.5 mg.  She also indicated that she had persistent episodes of chest discomfort that were atypical and occurred when lying down.  This was thought to be secondary to GERD and her PPI was restarted.  She was last seen by Dr. Oval Linsey on 03/12/2020 and indicated that a few weeks prior she had chest discomfort and was also having pain in her left foot that was prior to her previous MI.  She also indicated that she was having trouble affording her medications.  She had not been on her BiDil or Brilinta or Praluent for over a month.  She indicated that her blood pressure has been stable.  She has not been getting regular exercise.  She had plan to start walking more with the nicer weather.  She also indicated that her sister had moved to [place] and had been very helpful to her recently.  She was in the process of working with social work and applying for [employer].  At that time she was without insurance.  She is seen today virtually for follow-up and states she feels well.  She has been able to get all of her medications working with social work.  She states she has not taken her blood pressure medications yet this morning but her blood pressure has been running in the 130s over 70s.  She has been trying to eat a low-sodium diet and states she monitors her blood pressure daily.  She has not been as active recently but does go out sometimes.  I have asked her to start walking for 15 minutes daily.  She also states that she has periodic brief dizzy episodes.  I will send her the sheet for the [place] support stockings.  I will also give her the salty 6 diet sheet.  We will follow up with her in 1 month to reevaluate and assess her blood pressures.  Today  she denies chest pain, shortness of breath, lower extremity edema, fatigue, palpitations, melena, hematuria, hemoptysis, diaphoresis, weakness, presyncope, syncope, orthopnea, and PND.   The patient does not symptoms concerning for COVID-19 infection (fever, chills, cough, or new SHORTNESS OF BREATH).    Prior CV studies:   The following studies were reviewed today:  Echocardiogram 07/26/2018 Left ventricle: The cavity size was normal. Systolic function was mildly reduced. The estimated ejection fraction was in the range of 45% to 50%. Hypokinesis of the mid-apicalinferior and inferoseptal myocardium. Doppler parameters are consistent with abnormal left ventricular relaxation (grade 1 diastolic dysfunction). Acoustic contrast opacification revealed no evidence ofthrombus. - Mitral valve: There was moderate regurgitation directed eccentrically and anteriorly. - Left atrium: The atrium was mildly dilated. - Atrial septum: No defect or patent foramen ovale was identified.  Impressions:  - Direct comparison to images from August 2917 shows no change in wall motion pattern or overall LV EF.  Nuclear stress test 11/24/2017  The left ventricular ejection fraction is moderately decreased (30-44%).  Nuclear stress EF: 36%.  There was no ST segment deviation noted during stress.  There is a large defect of severe severity present in the mid anterior, mid inferior, apical anterior, apical septal, apical inferior, apical lateral and apex location. The defect is partially reversible. This is consistent with infarct with small amount of peri infarct ischemia in the inferior and apical walls.  This is a high risk study.  Compared to prior study the EF has declined and there appears to be peri infarct ischemia in the inferior wall although this could also be variations in diaphragmatic attenuation artifact.  Cardiac catheterization 12/07/2017   Mid LM to Ost LAD DES Stent  is 10% stenosed. -This entire segment is heavily calcified. There is a significant step down after the stent at the takeoff of the diagonal branches.  Prox LAD lesion is 20% stenosed.  Ost Ramus lesion is 40% stenosed. Proc Circumflex ~40%.  There is moderate left ventricular systolic dysfunction. The left ventricular ejection fraction is 35-45% by visual estimate.  LV end diastolic pressure is moderately elevated. 22-24 mmHg  Exercise tolerance test 12/19 No ischemia.  6.2 METS on Bruce protocol..   Past Medical History:  Diagnosis Date   Breast cancer ([place])    Cancer ([place])    Coronary artery disease    Depression    DVT (deep venous thrombosis) ([place]) 11/15   left femerol artery injury during resusciation.   GERD (gastroesophageal reflux disease)    HCAP (healthcare-associated pneumonia) 11/22/2014   Heart murmur    High cholesterol    History of blood transfusion 09/2014   related to MI   History of gout    Hypertension    Liver laceration 10/19/14   Pulmonary edema 10/19/14   Renal disorder    Sleep apnea    "suppose to wear mask but I don't" (11/22/2014)   STEMI (ST elevation myocardial infarction) ([place]) 10/19/14   Type II diabetes mellitus ([place])    Urticaria    Past Surgical History:  Procedure Laterality Date   ABDOMINAL HYSTERECTOMY  ~ 7681   APPLICATION OF WOUND VAC  10/2014   "over my naval" (11/22/2014)   BREAST LUMPECTOMY WITH AXILLARY LYMPH NODE BIOPSY     BREAST SURGERY     CESAREAN SECTION  1980; [place]  09/2014   "multiple"/notes 11/22/2014   EXTRACORPOREAL CIRCULATION     Femerol artery repair Right 09/2014   pt insists it was a right femoral artery repair on 11/22/2014   LAPAROSCOPIC ABDOMINAL EXPLORATION     LEFT HEART CATH AND CORONARY ANGIOGRAPHY N/A 12/07/2017   Procedure: LEFT HEART CATH AND CORONARY ANGIOGRAPHY;  Surgeon: Leonie Man, MD;  Location: [facility];  Service: Cardiovascular;  Laterality: N/A;   LEFT VENTRICULAR ASSIST DEVICE     pt is not aware of this hx on 11/22/2014   Liver laceration repair  09/2014   related to CPR/notes 11/22/2014   TRACHEOSTOMY  09/2014   "closed on it's own when they took it out"   TUBAL LIGATION  1982     Current Meds  Medication Sig   acetaminophen (TYLENOL) 500 MG tablet Take 1,000 mg by mouth every 6 (six) hours as needed.   Alirocumab (PRALUENT) 150 MG/ML SOAJ Inject 150 mg into the skin every 14 (fourteen) days.   aspirin EC 81 MG tablet Take 1  tablet (81 mg total) by mouth daily.   blood glucose meter kit and supplies KIT by Does not apply route daily as needed. Dispense based on patient and insurance preference. Use up to four times daily as directed. (FOR ICD-9 250.00, 250.01).   BRILINTA 60 MG TABS tablet TAKE 1 TABLET (60 MG TOTAL) BY MOUTH 2 (TWO) TIMES DAILY.   EPINEPHrine (AUVI-Q) 0.3 mg/0.3 mL IJ SOAJ injection Inject 0.3 mLs (0.3 mg total) into the muscle as needed for anaphylaxis.   exemestane (AROMASIN) 25 MG tablet Take 25 mg by mouth daily after breakfast.    ezetimibe (ZETIA) 10 MG tablet Take 1 tablet (10 mg total) by mouth daily.   fluticasone (FLONASE) 50 MCG/ACT nasal spray 2 sprays per nostril at night for stuffy nose at 7pm   furosemide (LASIX) 40 MG tablet TAKE 1 TABLET AS NEEDED FOR SHORTNESS OF BREATH OR WEIGHT GAIN OF 2 POUNDS IN 24 HOURS OR 5 POUNDS IN 1 WEEK   glucose blood test strip Use as instructed. E11.5. To check blood glucose daily.   hydrALAZINE (APRESOLINE) 25 MG tablet Take 1 tablet (25 mg total) by mouth in the morning and at bedtime.   isosorbide mononitrate (IMDUR) 30 MG 24 hr tablet Take 1 tablet (30 mg total) by mouth daily.   losartan (COZAAR) 25 MG tablet Take 1 tablet (25 mg total) by mouth daily.   magnesium oxide (MAG-OX) 400 MG tablet TAKE 1 TABLET BY MOUTH EVERY DAY   metFORMIN (GLUCOPHAGE-XR) 500 MG 24 hr tablet TAKE 1 TABLET BY  MOUTH EVERY DAY WITH BREAKFAST   metoprolol succinate (TOPROL-XL) 25 MG 24 hr tablet Take 1 tablet (25 mg total) by mouth daily.   montelukast (SINGULAIR) 10 MG tablet Take 1 tablet (10 mg total) by mouth at bedtime.   nitroGLYCERIN (NITROSTAT) 0.4 MG SL tablet Place 1 tablet (0.4 mg total) under the tongue every 5 (five) minutes as needed for chest pain. max of 3 if no relief call 911   rosuvastatin (CRESTOR) 20 MG tablet TAKE 1 TABLET BY MOUTH 3 TIMES PER WEEK (Patient taking differently: Take 10 mg by mouth. TAKE HALF TABLET BY MOUTH 3 TIMES PER WEEK)   spironolactone (ALDACTONE) 25 MG tablet TAKE 1 TABLET BY MOUTH EVERY DAY   triamcinolone cream (KENALOG) 0.1 % Apply 1 application topically 2 (two) times daily.   [DISCONTINUED] albuterol (PROVENTIL HFA;VENTOLIN HFA) 108 (90 Base) MCG/ACT inhaler Inhale 2 puffs into the lungs every 4 (four) hours as needed for wheezing or shortness of breath (cough, shortness of breath or wheezing.).   [DISCONTINUED] polyethylene glycol (MIRALAX / GLYCOLAX) 17 g packet Take 17 g by mouth daily.     Allergies:   Dilaudid [hydromorphone hcl], Hydromorphone, Epinephrine, and Lactose intolerance (gi)   Social History   Tobacco Use   Smoking status: Never Smoker   Smokeless tobacco: Never Used  Substance Use Topics   Alcohol use: Yes    Comment: occ   Drug use: No     Family Hx: The patient's family history includes Alzheimer's disease in her father; Asthma in her daughter; COPD in her mother; Diabetes in her brother and mother; Heart attack in her father; Hypertension in her mother. There is no history of Stroke, Colon cancer, Stomach cancer, Pancreatic cancer, or AAA (abdominal aortic aneurysm).  ROS:   Please see the history of present illness.     All other systems reviewed and are negative.   Labs/Other Tests and Data Reviewed:  Recent Labs: 10/17/2019: ALT 9; BUN 13; Creatinine, Ser 0.75; Potassium 4.6; Sodium 137   Recent  Lipid Panel Lab Results  Component Value Date/Time   CHOL 151 10/17/2019 09:45 AM   TRIG 78 10/17/2019 09:45 AM   HDL 51 10/17/2019 09:45 AM   CHOLHDL 3.0 10/17/2019 09:45 AM   CHOLHDL 3.1 10/05/2016 09:37 AM   LDLCALC 85 10/17/2019 09:45 AM    Wt Readings from Last 3 Encounters:  04/08/20 192 lb (87.1 kg)  03/12/20 197 lb 3.2 oz (89.4 kg)  01/22/20 200 lb 6.4 oz (90.9 kg)     Exam:    Vital Signs:  BP (!) 151/95    Pulse 73    Ht 5' 2.5" (1.588 m)    Wt 192 lb (87.1 kg)    BMI 34.56 kg/m    Well nourished, well developed female in no  acute distress.   ASSESSMENT & PLAN:    1.  Coronary artery disease/chronic angina -no chest pain today.  Left heart cath January 2019 showed patent stents with 20-40% lesions.  Exercise tolerance test 12/19 - for ischemia.  During her 03/12/2020 visit she indicated that she had been off her BiDil due to not being able to afford the medication.  She was switched to Imdur 30 mg daily and hydralazine 25 mg twice daily.  Plan to repeat cardiac catheterization if she continued to have symptoms after resuming her medication. Continue current medical therapy Heart healthy low-sodium diet Increase physical activity as tolerated  Chronic systolic and diastolic heart failure-no increased dyspnea or activity intolerance.  Euvolemic today.  Previously plan to repeat echocardiogram based on insurance. Continue current medical therapy Heart healthy low-sodium diet Daily weights Lower extremity support stockings-[place] sheet given Increase physical activity as tolerated Repeat echocardiogram-when insurance is obtained.  Essential hypertension-BP [id number].  Had not taken her blood pressure medications yet this morning.   Well controlled at home with recent blood pressure readings.. Continue current medical therapy Heart healthy low-sodium diet-salty 6 given Increase physical activity as tolerated Blood pressure log  Hyperlipidemia-10/17/2019:  Cholesterol, Total 151; HDL 51; LDL Chol Calc (NIH) 85; Triglycerides 78 Rosuvastatin previously reduced due to muscle cramping Working with patient assistant program Heart healthy low-sodium high-fiber diet Increase physical activity as tolerated  Obstructive sleep apnea-previously did not tolerate CPAP and using an oral airway device. Continue use  Disposition: Follow-up with me in 1 month.  COVID-19 Education: The signs and symptoms of COVID-19 were discussed with the patient and how to seek care for testing (follow up with PCP or arrange E-visit).  The importance of social distancing was discussed today.  Patient Risk:   After full review of this patients clinical status, I feel that they are at least moderate risk at this time.  Time:   Today, I have spent 15 minutes with the patient with telehealth technology discussing medications, diet exercise, and social work.     Medication Adjustments/Labs and Tests Ordered: Current medicines are reviewed at length with the patient today.  Concerns regarding medicines are outlined above.   Tests Ordered: No orders of the defined types were placed in this encounter.  Medication Changes: No orders of the defined types were placed in this encounter.   Disposition:  in 1 month(s)  Signed, Jossie Ng. [facility] [address] Office [phone] Fax [phone]

## 2020-04-08 ENCOUNTER — Encounter: Payer: Self-pay | Admitting: General Practice

## 2020-04-08 ENCOUNTER — Telehealth (INDEPENDENT_AMBULATORY_CARE_PROVIDER_SITE_OTHER): Payer: Self-pay | Admitting: General Practice

## 2020-04-08 VITALS — BP 151/95 | HR 73 | Ht 62.5 in | Wt 192.0 lb

## 2020-04-08 DIAGNOSIS — I5042 Chronic combined systolic (congestive) and diastolic (congestive) heart failure: Secondary | ICD-10-CM

## 2020-04-08 DIAGNOSIS — I10 Essential (primary) hypertension: Secondary | ICD-10-CM

## 2020-04-08 DIAGNOSIS — I110 Hypertensive heart disease with heart failure: Secondary | ICD-10-CM

## 2020-04-08 DIAGNOSIS — G4733 Obstructive sleep apnea (adult) (pediatric): Secondary | ICD-10-CM

## 2020-04-08 DIAGNOSIS — I2510 Atherosclerotic heart disease of native coronary artery without angina pectoris: Secondary | ICD-10-CM

## 2020-04-08 DIAGNOSIS — E785 Hyperlipidemia, unspecified: Secondary | ICD-10-CM

## 2020-04-08 NOTE — Patient Instructions (Signed)
Medication Instructions:  NO CHANGE *If you need a refill on your cardiac medications before your next appointment, please call your pharmacy*   Lab Work: If you have labs (blood work) drawn today and your tests are completely normal, you will receive your results only by: [name] MyChart Message (if you have MyChart) OR . A paper copy in the mail If you have any lab test that is abnormal or we need to change your treatment, we will call you to review the results.   Follow-Up: At [facility], you and your health needs are our priority.  As part of our continuing mission to provide you with exceptional heart care, we have created designated Provider Care Teams.  These Care Teams include your primary Cardiologist (physician) and Advanced Practice Providers (APPs -  Physician Assistants and Nurse Practitioners) who all work together to provide you with the care you need, when you need it.  We recommend signing up for the patient portal called "MyChart".  Sign up information is provided on this After Visit Summary.  MyChart is used to connect with patients for Virtual Visits (Telemedicine).  Patients are able to view lab/test results, encounter notes, upcoming appointments, etc.  Non-urgent messages can be sent to your provider as well.   To learn more about what you can do with MyChart, go to [email or web address].    Your next appointment:   1 month(s)  The format for your next appointment:   Virtual Visit   Provider:   Coletta Memos NP   Other Instructions  TRACK BLOOD PRESSURE  ONCE DAILY 1 HOUR AFTER TAKING MEDICATIONS  WALK 15 MINUTES ONCE DAILY  SUPPORT HOSE OR SOCKS

## 2020-04-09 ENCOUNTER — Ambulatory Visit: Payer: Self-pay | Admitting: General Practice

## 2020-04-24 ENCOUNTER — Other Ambulatory Visit: Payer: Self-pay

## 2020-04-26 ENCOUNTER — Other Ambulatory Visit: Payer: Self-pay | Admitting: Cardiovascular Disease

## 2020-05-13 ENCOUNTER — Telehealth: Payer: Self-pay

## 2020-05-13 NOTE — Telephone Encounter (Signed)
Follow up  Pt said she called my praluent and she got approved with the free medications and she said they will fax approval letter

## 2020-05-13 NOTE — Telephone Encounter (Signed)
Called to remind the pt to call my praluent to provide income information to them to receive the drug free from the manufacturer

## 2020-05-17 NOTE — Progress Notes (Deleted)
{Choose 1 Note Type (Telehealth Visit or Telephone Visit):[phone]}  Evaluation Performed:  Follow-up visit  This visit type was conducted due to national recommendations for restrictions regarding the COVID-19 Pandemic (e.g. social distancing).  This format is felt to be most appropriate for this patient at this time.  All issues noted in this document were discussed and addressed.  No physical exam was performed (except for noted visual exam findings with Video Visits).  Please refer to the patient's chart (MyChart message for video visits and phone note for telephone visits) for the patient's consent to telehealth for [place]  Date:  05/17/2020   ID:  Marie Tanner, DOB Jan 31, 1956, MRN [id number]  Patient Location: *** [place] [address]   Provider location:     [place] [address] Office [phone] Fax [phone]   PCP:  Forrest Moron, MD  Cardiologist:  Skeet Latch, MD *** Electrophysiologist:  None   Chief Complaint:  ***  History of Present Illness:    Marie Tanner is a 64 y.o. female who presents via audio/video conferencing for a telehealth visit today.  Patient verified DOB and address.  The patient {does/does not:200015} symptoms concerning for COVID-19 infection (fever, chills, cough, or new SHORTNESS OF BREATH).    Prior CV studies:   The following studies were reviewed today:  ***  Past Medical History:  Diagnosis Date  . Breast cancer ([place])   . Cancer ([place])   . Coronary artery disease   . Depression   . DVT (deep venous thrombosis) ([place]) 11/15   left femerol artery injury during resusciation.  [name] GERD (gastroesophageal reflux disease)   . HCAP (healthcare-associated pneumonia) 11/22/2014  . Heart murmur   . High cholesterol   . History of blood transfusion 09/2014   related to MI  . History of gout   . Hypertension   . Liver laceration  10/19/14  . Pulmonary edema 10/19/14  . Renal disorder   . Sleep apnea    "suppose to wear mask but I don't" (11/22/2014)  . STEMI (ST elevation myocardial infarction) ([place]) 10/19/14  . Type II diabetes mellitus ([place])   . Urticaria    Past Surgical History:  Procedure Laterality Date  . ABDOMINAL HYSTERECTOMY  ~ 2000  . APPLICATION OF WOUND VAC  10/2014   "over my naval" (11/22/2014)  . BREAST LUMPECTOMY WITH AXILLARY LYMPH NODE BIOPSY    . BREAST SURGERY    . [employer]; 1982  . CORONARY ANGIOPLASTY WITH STENT PLACEMENT  09/2014   "multiple"/notes 11/22/2014  . EXTRACORPOREAL CIRCULATION    . Femerol artery repair Right 09/2014   pt insists it was a right femoral artery repair on 11/22/2014  . LAPAROSCOPIC ABDOMINAL EXPLORATION    . LEFT HEART CATH AND CORONARY ANGIOGRAPHY N/A 12/07/2017   Procedure: LEFT HEART CATH AND CORONARY ANGIOGRAPHY;  Surgeon: Leonie Man, MD;  Location: [facility];  Service: Cardiovascular;  Laterality: N/A;  . LEFT VENTRICULAR ASSIST DEVICE     pt is not aware of this hx on 11/22/2014  . Liver laceration repair  09/2014   related to CPR/notes 11/22/2014  . TRACHEOSTOMY  09/2014   "closed on it's own when they took it out"  . TUBAL LIGATION  1982     No outpatient medications have been marked as taking for the 05/21/20 encounter (Appointment) with Deberah Pelton, NP.  Allergies:   Dilaudid [hydromorphone hcl], Hydromorphone, Epinephrine, and Lactose intolerance (gi)   Social History   Tobacco Use  . Smoking status: Never Smoker  . Smokeless tobacco: Never Used  Vaping Use  . Vaping Use: Never used  Substance Use Topics  . Alcohol use: Yes    Comment: occ  . Drug use: No     Family Hx: The patient's family history includes Alzheimer's disease in her father; Asthma in her daughter; COPD in her mother; Diabetes in her brother and mother; Heart attack in her father; Hypertension in her mother. There is no history  of Stroke, Colon cancer, Stomach cancer, Pancreatic cancer, or AAA (abdominal aortic aneurysm).  ROS:   Please see the history of present illness.     All other systems reviewed and are negative.   Labs/Other Tests and Data Reviewed:    Recent Labs: 10/17/2019: ALT 9; BUN 13; Creatinine, Ser 0.75; Potassium 4.6; Sodium 137   Recent Lipid Panel Lab Results  Component Value Date/Time   CHOL 151 10/17/2019 09:45 AM   TRIG 78 10/17/2019 09:45 AM   HDL 51 10/17/2019 09:45 AM   CHOLHDL 3.0 10/17/2019 09:45 AM   CHOLHDL 3.1 10/05/2016 09:37 AM   LDLCALC 85 10/17/2019 09:45 AM    Wt Readings from Last 3 Encounters:  04/08/20 192 lb (87.1 kg)  03/12/20 197 lb 3.2 oz (89.4 kg)  01/22/20 200 lb 6.4 oz (90.9 kg)     Exam:    Vital Signs:  There were no vitals taken for this visit.   Well nourished, well developed female in no  acute distress.   ASSESSMENT & PLAN:    1.    COVID-19 Education: The signs and symptoms of COVID-19 were discussed with the patient and how to seek care for testing (follow up with PCP or arrange E-visit).  The importance of social distancing was discussed today.  Patient Risk:   After full review of this patients clinical status, I feel that they are at least moderate risk at this time.  Time:   Today, I have spent *** minutes with the patient with telehealth technology discussing ***.     Medication Adjustments/Labs and Tests Ordered: Current medicines are reviewed at length with the patient today.  Concerns regarding medicines are outlined above.   Tests Ordered: No orders of the defined types were placed in this encounter.  Medication Changes: No orders of the defined types were placed in this encounter.   Disposition:  {follow up:15908}  Signed, Jossie Ng. Marisha Renier NP-C    07/04/2019 11:58 AM    [address] Office [phone] Fax [phone]

## 2020-05-21 ENCOUNTER — Telehealth: Payer: Self-pay | Admitting: General Practice

## 2020-05-26 NOTE — Progress Notes (Signed)
Virtual Visit via Telephone Note   This visit type was conducted due to national recommendations for restrictions regarding the COVID-19 Pandemic (e.g. social distancing) in an effort to limit this patient's exposure and mitigate transmission in our community.  Due to her co-morbid illnesses, this patient is at least at moderate risk for complications without adequate follow up.  This format is felt to be most appropriate for this patient at this time.  The patient did not have access to video technology/had technical difficulties with video requiring transitioning to audio format only (telephone).  All issues noted in this document were discussed and addressed.  No physical exam could be performed with this format.  Please refer to the patient's chart for her  consent to telehealth for [facility].   Date:  05/27/2020   ID:  Marie Tanner, DOB September 07, 1956, MRN [id number]  Patient Location: Home Provider Location: Office  PCP:  Forrest Moron, MD  Cardiologist:  Skeet Latch, MD  Electrophysiologist:  None   Evaluation Performed:  Follow-Up Visit  Chief Complaint:  Leg Pain   History of Present Illness:    Marie Tanner is a 64 y.o. female with we are following for ongoing assessment and management of coronary artery disease, status post STEMI in 2005 with cardiac arrest, seen at [facility] and underwent cardiac catheterization receiving drug-eluting stent x2 to her LAD and circumflex.  During her hospitalization she required tracheostomy.  It was also noted that her liver was lacerated during CPR and she required surgical repair.  She also developed DVT during hospitalization.  A week after discharge from [facility] the patient was admitted to [facility] with pneumonia.  She underwent surgery for breast cancer in March 2017, completed radiation September 2017.  She was noted to have OSA and declined use of CPAP and underwent fitting for an oral airway.  She had repeat cardiac  catheterization in 2019 which revealed patent stents that were heavily calcified.  She had a 40% RI, 40% proximal circumflex, and 20% proximal LAD.  Her LVEF was 35% to 45% with LVEDP elevated.  BiDil was added to her medication regimen for afterload reduction and poorly controlled hypertension.  Repeat echocardiogram revealed EF of 45 to 50% with hypokinesis of the mid to apical inferior and inferior septal myocardium.  She was started on spironolactone.  Repeat ETT on 12/19 was negative for evidence of ischemia.  She achieved 6.2 METS on Bruce protocol.  On follow-up with Dr. Oval Linsey she had complaints of dizziness and therefore losartan was reduced to 12.5 mg daily she continued to have atypical chest pain which occurred while lying down which they felt was related to GERD and PPI was restarted.  She also admitted that she was having trouble affording her medications and had not been taking BiDil or Brilinta or Praluent for over a month.  She was not getting regular exercise.  Her last encounter was on 04/08/2020 by Coletta Memos, NP via virtual visit.  She was trying to exercise and eat low-sodium diet and monitor blood pressures daily.  She did complain of some periodic brief dizzy episodes.  She was given a "salty 6" information sheet to help her to reduce her sodium in her diet.  She was also advised on support hose and was given information for [facility] in [place].  She is here for virtual follow-up to evaluate her status.  He was also noted that she was approved for Praluent as free therapy due to income.  The patient does not  have symptoms concerning for COVID-19 infection (fever, chills, cough, or new shortness of breath).    Past Medical History:  Diagnosis Date  . Breast cancer ([place])   . Cancer ([place])   . Coronary artery disease   . Depression   . DVT (deep venous thrombosis) ([place]) 11/15   left femerol artery injury during resusciation.  [name] GERD (gastroesophageal reflux  disease)   . HCAP (healthcare-associated pneumonia) 11/22/2014  . Heart murmur   . High cholesterol   . History of blood transfusion 09/2014   related to MI  . History of gout   . Hypertension   . Liver laceration 10/19/14  . Pulmonary edema 10/19/14  . Renal disorder   . Sleep apnea    "suppose to wear mask but I don't" (11/22/2014)  . STEMI (ST elevation myocardial infarction) ([place]) 10/19/14  . Type II diabetes mellitus ([place])   . Urticaria    Past Surgical History:  Procedure Laterality Date  . ABDOMINAL HYSTERECTOMY  ~ 2000  . APPLICATION OF WOUND VAC  10/2014   "over my naval" (11/22/2014)  . BREAST LUMPECTOMY WITH AXILLARY LYMPH NODE BIOPSY    . BREAST SURGERY    . [place]; 1982  . CORONARY ANGIOPLASTY WITH STENT PLACEMENT  09/2014   "multiple"/notes 11/22/2014  . EXTRACORPOREAL CIRCULATION    . Femerol artery repair Right 09/2014   pt insists it was a right femoral artery repair on 11/22/2014  . LAPAROSCOPIC ABDOMINAL EXPLORATION    . LEFT HEART CATH AND CORONARY ANGIOGRAPHY N/A 12/07/2017   Procedure: LEFT HEART CATH AND CORONARY ANGIOGRAPHY;  Surgeon: Leonie Man, MD;  Location: [facility];  Service: Cardiovascular;  Laterality: N/A;  . LEFT VENTRICULAR ASSIST DEVICE     pt is not aware of this hx on 11/22/2014  . Liver laceration repair  09/2014   related to CPR/notes 11/22/2014  . TRACHEOSTOMY  09/2014   "closed on it's own when they took it out"  . TUBAL LIGATION  1982     Current Meds  Medication Sig  . acetaminophen (TYLENOL) 500 MG tablet Take 1,000 mg by mouth every 6 (six) hours as needed.  . Alirocumab (PRALUENT) 150 MG/ML SOAJ Inject 150 mg into the skin every 14 (fourteen) days.  [name] aspirin EC 81 MG tablet Take 1 tablet (81 mg total) by mouth daily.  . blood glucose meter kit and supplies KIT by Does not apply route daily as needed. Dispense based on patient and insurance preference. Use up to four times daily as directed.  (FOR ICD-9 250.00, 250.01).  . BRILINTA 60 MG TABS tablet TAKE 1 TABLET (60 MG TOTAL) BY MOUTH 2 (TWO) TIMES DAILY.  [name] EPINEPHrine (AUVI-Q) 0.3 mg/0.3 mL IJ SOAJ injection Inject 0.3 mLs (0.3 mg total) into the muscle as needed for anaphylaxis.  [name] exemestane (AROMASIN) 25 MG tablet Take 25 mg by mouth daily after breakfast.   . ezetimibe (ZETIA) 10 MG tablet Take 1 tablet (10 mg total) by mouth daily.  . fluticasone (FLONASE) 50 MCG/ACT nasal spray 2 sprays per nostril at night for stuffy nose at 7pm  . furosemide (LASIX) 40 MG tablet TAKE 1 TABLET AS NEEDED FOR SHORTNESS OF BREATH OR WEIGHT GAIN OF 2 POUNDS IN 24 HOURS OR 5 POUNDS IN 1 WEEK  . glucose blood test strip Use as instructed. E11.5. To check blood glucose daily.  . hydrALAZINE (APRESOLINE) 25 MG tablet Take 1 tablet (25  mg total) by mouth in the morning and at bedtime.  . isosorbide mononitrate (IMDUR) 30 MG 24 hr tablet Take 1 tablet (30 mg total) by mouth daily.  [name] losartan (COZAAR) 25 MG tablet Take 1 tablet (25 mg total) by mouth daily.  . magnesium oxide (MAG-OX) 400 MG tablet TAKE 1 TABLET BY MOUTH EVERY DAY  . metFORMIN (GLUCOPHAGE-XR) 500 MG 24 hr tablet TAKE 1 TABLET BY MOUTH EVERY DAY WITH BREAKFAST  . metoprolol succinate (TOPROL-XL) 25 MG 24 hr tablet Take 1 tablet (25 mg total) by mouth daily.  . montelukast (SINGULAIR) 10 MG tablet Take 1 tablet (10 mg total) by mouth at bedtime.  . nitroGLYCERIN (NITROSTAT) 0.4 MG SL tablet Place 1 tablet (0.4 mg total) under the tongue every 5 (five) minutes as needed for chest pain. max of 3 if no relief call 911  . rosuvastatin (CRESTOR) 20 MG tablet TAKE 1 TABLET BY MOUTH 3 TIMES PER WEEK (Patient taking differently: Take 10 mg by mouth. TAKE HALF TABLET BY MOUTH 3 TIMES PER WEEK)  . spironolactone (ALDACTONE) 25 MG tablet TAKE 1 TABLET BY MOUTH EVERY DAY  . triamcinolone cream (KENALOG) 0.1 % Apply 1 application topically 2 (two) times daily.     Allergies:   Dilaudid  [hydromorphone hcl], Hydromorphone, Epinephrine, and Lactose intolerance (gi)   Social History   Tobacco Use  . Smoking status: Never Smoker  . Smokeless tobacco: Never Used  Vaping Use  . Vaping Use: Never used  Substance Use Topics  . Alcohol use: Yes    Comment: occ  . Drug use: No     Family Hx: The patient's family history includes Alzheimer's disease in her father; Asthma in her daughter; COPD in her mother; Diabetes in her brother and mother; Heart attack in her father; Hypertension in her mother. There is no history of Stroke, Colon cancer, Stomach cancer, Pancreatic cancer, or AAA (abdominal aortic aneurysm).  ROS:   Please see the history of present illness.    All other systems reviewed and are negative.   Prior CV studies:   The following studies were reviewed today: Echocardiogram 07/26/2018 Left ventricle: The cavity size was normal. Systolic function was mildly reduced. The estimated ejection fraction was in the range of 45% to 50%. Hypokinesis of the mid-apicalinferior and inferoseptal myocardium. Doppler parameters are consistent with abnormal left ventricular relaxation (grade 1 diastolic dysfunction). Acoustic contrast opacification revealed no evidence ofthrombus. - Mitral valve: There was moderate regurgitation directed eccentrically and anteriorly. - Left atrium: The atrium was mildly dilated. - Atrial septum: No defect or patent foramen ovale was identified.  Impressions:  - Direct comparison to images from August 2917 shows no change in wall motion pattern or overall LV EF.  Nuclear stress test 11/24/2017  The left ventricular ejection fraction is moderately decreased (30-44%).  Nuclear stress EF: 36%.  There was no ST segment deviation noted during stress.  There is a large defect of severe severity present in the mid anterior, mid inferior, apical anterior, apical septal, apical inferior, apical lateral and apex location.  The defect is partially reversible. This is consistent with infarct with small amount of peri infarct ischemia in the inferior and apical walls.  This is a high risk study.  Compared to prior study the EF has declined and there appears to be peri infarct ischemia in the inferior wall although this could also be variations in diaphragmatic attenuation artifact.  Cardiac catheterization 12/07/2017   Mid LM to [facility] LAD  DES Stent is 10% stenosed. -This entire segment is heavily calcified. There is a significant step down after the stent at the takeoff of the diagonal branches.  Prox LAD lesion is 20% stenosed.  Ost Ramus lesion is 40% stenosed. Proc Circumflex ~40%.  There is moderate left ventricular systolic dysfunction. The left ventricular ejection fraction is 35-45% by visual estimate.  LV end diastolic pressure is moderately elevated. 22-24 mmHg  Exercise tolerance test 12/19 No ischemia. 6.2 METS on Bruce protocol..   Labs/Other Tests and Data Reviewed:    EKG:  No ECG reviewed.  Recent Labs: 10/17/2019: ALT 9; BUN 13; Creatinine, Ser 0.75; Potassium 4.6; Sodium 137   Recent Lipid Panel Lab Results  Component Value Date/Time   CHOL 151 10/17/2019 09:45 AM   TRIG 78 10/17/2019 09:45 AM   HDL 51 10/17/2019 09:45 AM   CHOLHDL 3.0 10/17/2019 09:45 AM   CHOLHDL 3.1 10/05/2016 09:37 AM   LDLCALC 85 10/17/2019 09:45 AM    Wt Readings from Last 3 Encounters:  05/27/20 190 lb (86.2 kg)  04/08/20 192 lb (87.1 kg)  03/12/20 197 lb 3.2 oz (89.4 kg)     Objective:    Vital Signs:  BP (!) 143/79   Pulse 79   Ht 5' 2.5" (1.588 m)   Wt 190 lb (86.2 kg)   BMI 34.20 kg/m    VITAL SIGNS:  reviewed GEN:  no acute distress RESPIRATORY:  normal respiratory effort, symmetric expansion PSYCH:  normal affect  ASSESSMENT & PLAN:    1.  Coronary artery disease: History of STEMI in 2005 with cardiac arrest.  Repeat cardiac catheterization 2019 revealing patent stents which  were heavily calcified.  She is medically managed currently.  2.  Chronic lower extremity edema: She takes Lasix as needed and keeps her feet elevated and is doing her best to avoid salt.  This does help especially keeping her legs elevated.  She has not yet gotten compression hose.  She still plans to purchase them.  She also has a lot of pain in her knees from arthritis.  And is being followed by PCP for this.  3.  Chronic systolic CHF: Denies any dyspnea on exertion, PND or orthopnea.  Continues to have lower extremity edema.  She is to continue daily weights and salt avoidance.  Continue Lasix.  4.  Hyperlipidemia: She is recently been approved for Praluent injections.  She will need to have a follow-up appointment with lipid clinic in 3 months with lipids and LFTs to be drawn to evaluate her response.  I have refilled her Zetia prescription.  COVID-19 Education: The signs and symptoms of COVID-19 were discussed with the patient and how to seek care for testing (follow up with PCP or arrange E-visit).  The importance of social distancing was discussed today.  Time:   Today, I have spent 25 minutes with the patient with telehealth technology discussing the above problems.     Medication Adjustments/Labs and Tests Ordered: Current medicines are reviewed at length with the patient today.  Concerns regarding medicines are outlined above.   Tests Ordered: No orders of the defined types were placed in this encounter.   Medication Changes: No orders of the defined types were placed in this encounter.   Disposition:  Follow up in 6 month(s)  Signed, Phill Myron. [place] Pugh, ANP, AACC  05/27/2020 10:45 AM    [facility]

## 2020-05-27 ENCOUNTER — Encounter: Payer: Self-pay | Admitting: Adult Health

## 2020-05-27 ENCOUNTER — Telehealth (INDEPENDENT_AMBULATORY_CARE_PROVIDER_SITE_OTHER): Payer: Self-pay | Admitting: Adult Health

## 2020-05-27 VITALS — BP 143/79 | HR 79 | Ht 62.5 in | Wt 190.0 lb

## 2020-05-27 DIAGNOSIS — I5042 Chronic combined systolic (congestive) and diastolic (congestive) heart failure: Secondary | ICD-10-CM

## 2020-05-27 DIAGNOSIS — I2510 Atherosclerotic heart disease of native coronary artery without angina pectoris: Secondary | ICD-10-CM

## 2020-05-27 DIAGNOSIS — I10 Essential (primary) hypertension: Secondary | ICD-10-CM

## 2020-05-27 DIAGNOSIS — Z9861 Coronary angioplasty status: Secondary | ICD-10-CM

## 2020-05-27 DIAGNOSIS — Z853 Personal history of malignant neoplasm of breast: Secondary | ICD-10-CM

## 2020-05-27 MED ORDER — EZETIMIBE 10 MG PO TABS: 10.0000 mg | ORAL_TABLET | Freq: Every day | ORAL | 3 refills | Status: DC

## 2020-05-27 NOTE — Patient Instructions (Signed)
Medication Instructions:  Continue current medications  *If you need a refill on your cardiac medications before your next appointment, please call your pharmacy*   Lab Work: None Ordered   Testing/Procedures: None ordered   Follow-Up: At [facility], you and your health needs are our priority.  As part of our continuing mission to provide you with exceptional heart care, we have created designated Provider Care Teams.  These Care Teams include your primary Cardiologist (physician) and Advanced Practice Providers (APPs -  Physician Assistants and Nurse Practitioners) who all work together to provide you with the care you need, when you need it.  We recommend signing up for the patient portal called "MyChart".  Sign up information is provided on this After Visit Summary.  MyChart is used to connect with patients for Virtual Visits (Telemedicine).  Patients are able to view lab/test results, encounter notes, upcoming appointments, etc.  Non-urgent messages can be sent to your provider as well.   To learn more about what you can do with MyChart, go to [email or web address].    Your next appointment:   6 month(s)  The format for your next appointment:   In Person  Provider:   You may see Skeet Latch, MD or one of the following Advanced Practice Providers on your designated Care Team:    Kerin Ransom, PA-C  [place], [place]  Coletta Memos, [place]

## 2020-06-19 ENCOUNTER — Other Ambulatory Visit: Payer: Self-pay | Admitting: Cardiovascular Disease

## 2020-06-24 ENCOUNTER — Other Ambulatory Visit: Payer: Self-pay | Admitting: Pediatrics

## 2020-06-26 NOTE — Telephone Encounter (Signed)
Patient was approved and last shipment in June

## 2020-07-16 ENCOUNTER — Other Ambulatory Visit: Payer: Self-pay

## 2020-07-16 MED ORDER — PRALUENT 150 MG/ML ~~LOC~~ SOAJ: 150.0000 mg | SUBCUTANEOUS | 11 refills | Status: DC

## 2020-11-28 MED ORDER — METOPROLOL SUCCINATE ER 25 MG PO TB24: 25.0000 mg | ORAL_TABLET | Freq: Every day | ORAL | 3 refills | Status: DC

## 2020-11-28 MED ORDER — HYDRALAZINE HCL 25 MG PO TABS: 25.0000 mg | ORAL_TABLET | Freq: Two times a day (BID) | ORAL | 3 refills | Status: DC

## 2020-11-28 MED ORDER — ISOSORBIDE MONONITRATE ER 30 MG PO TB24: 30.0000 mg | ORAL_TABLET | Freq: Every day | ORAL | 3 refills | Status: DC

## 2020-11-28 MED ORDER — SPIRONOLACTONE 25 MG PO TABS: 25.0000 mg | ORAL_TABLET | Freq: Every day | ORAL | 2 refills | Status: DC

## 2020-12-10 ENCOUNTER — Ambulatory Visit: Payer: Self-pay | Admitting: Cardiovascular Disease

## 2020-12-12 ENCOUNTER — Encounter: Payer: Self-pay | Admitting: Cardiovascular Disease

## 2020-12-12 ENCOUNTER — Telehealth (INDEPENDENT_AMBULATORY_CARE_PROVIDER_SITE_OTHER): Payer: Self-pay | Admitting: Cardiovascular Disease

## 2020-12-12 VITALS — BP 135/77 | HR 78 | Ht 62.5 in | Wt 192.0 lb

## 2020-12-12 DIAGNOSIS — R002 Palpitations: Secondary | ICD-10-CM

## 2020-12-12 DIAGNOSIS — I10 Essential (primary) hypertension: Secondary | ICD-10-CM

## 2020-12-12 DIAGNOSIS — E785 Hyperlipidemia, unspecified: Secondary | ICD-10-CM

## 2020-12-12 DIAGNOSIS — I5042 Chronic combined systolic (congestive) and diastolic (congestive) heart failure: Secondary | ICD-10-CM

## 2020-12-12 DIAGNOSIS — G4733 Obstructive sleep apnea (adult) (pediatric): Secondary | ICD-10-CM

## 2020-12-12 DIAGNOSIS — I2510 Atherosclerotic heart disease of native coronary artery without angina pectoris: Secondary | ICD-10-CM

## 2020-12-12 DIAGNOSIS — Z79899 Other long term (current) drug therapy: Secondary | ICD-10-CM

## 2020-12-12 DIAGNOSIS — Z9861 Coronary angioplasty status: Secondary | ICD-10-CM

## 2020-12-12 HISTORY — DX: Palpitations: R00.2

## 2020-12-12 MED ORDER — HYDRALAZINE HCL 50 MG PO TABS: 50.0000 mg | ORAL_TABLET | Freq: Two times a day (BID) | ORAL | 3 refills | Status: DC

## 2020-12-12 MED ORDER — ROSUVASTATIN CALCIUM 20 MG PO TABS: ORAL_TABLET | ORAL | 6 refills | Status: DC

## 2020-12-12 NOTE — Patient Instructions (Addendum)
Medication Instructions:  STOP- Zetia INCREASE- Hydralazine 50 mg by mouth twice a day  *If you need a refill on your cardiac medications before your next appointment, please call your pharmacy*   Lab Work: Fasting Lipid, CMP, TSH, Magnesium and CBC   Testing/Procedures: Your physician has recommended that you wear an event monitor for 30 days. Event monitors are medical devices that record the heart's electrical activity. Doctors most often [place] these monitors to diagnose arrhythmias. Arrhythmias are problems with the speed or rhythm of the heartbeat. The monitor is a small, portable device. You can wear one while you do your normal daily activities. This is usually used to diagnose what is causing palpitations/syncope (passing out).   Follow-Up: At [facility], you and your health needs are our priority.  As part of our continuing mission to provide you with exceptional heart care, we have created designated Provider Care Teams.  These Care Teams include your primary Cardiologist (physician) and Advanced Practice Providers (APPs -  Physician Assistants and Nurse Practitioners) who all work together to provide you with the care you need, when you need it.  We recommend signing up for the patient portal called "MyChart".  Sign up information is provided on this After Visit Summary.  MyChart is used to connect with patients for Virtual Visits (Telemedicine).  Patients are able to view lab/test results, encounter notes, upcoming appointments, etc.  Non-urgent messages can be sent to your provider as well.   To learn more about what you can do with MyChart, go to [email or web address].    Your next appointment:    Thursday April 7th @ 8:40 am  The format for your next appointment:   In Person  Provider:   Skeet Latch, MD

## 2020-12-12 NOTE — Addendum Note (Signed)
Addended by: Vennie Homans on: 12/12/2020 05:30 PM   Modules accepted: Orders

## 2020-12-12 NOTE — Progress Notes (Signed)
Cardiology Office Note   Date:  12/12/2020   ID:  Sheree Lalla, DOB 1956/03/09, MRN [id number]  PCP:  Forrest Moron, MD  Cardiologist:   Skeet Latch, MD  Oncologist: Samuel Bouche, MD Radiation Oncologist: Ladona Horns, MD Surgical Oncologist: Nancie Neas, MD  No chief complaint on file.   History of Present Illness: Marie Tanner is a 65 y.o. female with CAD s/p STEMI, hypertension, chronic systolic and diastolic heart failure, OSA not on CPAP, diabetes, prior DVT, breast cancer, and mitral regurgitation who presents for follow up.  In 2015 she had a STEMI and cardiac arrest. She was cared for at [facility]. She ultimately received 2 DES (LM/LAD and LCx).  She had a prolonged hospitalization requiring tracheostomy.  Her liver was lacerated during CPR.  She required surgical repair after femoral catheterization.  She also developed a DVT in the hospital.  One week after discharge she developed pneumonia and was admitted at [facility].  She has been seen in the hospital twice for chest pain and ruled out for MI.  She had a negative Myoview 05/2015.  She also had negative CT scans for PE.  Ms. Mcanany also underwent surgery for breast cancer on 02/21/16.  She completed her radiation in September 2017.  She was referred for sleep study which was abnormal.  However, she declined using the CPAP device and is in the process of being fitted for an oral airway.  Ms. Kashlyn Salinas had an echo 06/2016 that showed LVEF 50-55% with grade 1 diastolic dysfunction.    Ms. Finstad reported an episode of chest pain that occurred while driving.  She was referred for [facility] 11/24/17 that revealed LVEF 36% with anterior, anteroseptal, apical inferior and apical lateral defects.  There was concern for infarct with peri-infarct ischemia in the inferior and apical walls.  She underwent LHC 11/2017 that revealed patent stents that were heavily calcified and there was a step down after the takeoff of  the diagonal.  She had 40% RI, 40% proximal LCx and 20% prox LAD.  LVEF was 35-45% and LVEDP was elevated.  BiDil was added to her regimen for afterload reduction and poorly controlled hypertension.  She reported increased dyspnea on exertion and edema.  Lasix was increased for 2 days.  She was also referred for an echocardiogram 06/2018 that revealed LVEF 45 to 50% with hypokinesis of the mid to apical inferior and inferoseptal myocardium that was relatively unchanged from prior.  She was also started on spironolactone.  She had atypical chest pain and was referred for an ETT 10/2018 that was negative.  She achieved 6.2 METS on a Bruce protocol.    Ms. Occhipinti reported episodes of dizziness.  Losartan was reduced to 12.5 mg she also had persistent episodes of chest discomfort that were atypical and occurred when lying down.  Thought to be secondary to GERD and her PPI was restarted.  At her last appointment Ms. Warsame was struggling due to cost of medications.  She has been out of her BiDil, ticagrelor, and Praluent.  BiDil was switched to hydralazine and Imdur.  Her BP has been improving.  It has been as high as 170-180s She was started on patient assistance for ticagrelor and Praluent.  She was having chest discomfort at the time.  It was thought that it was because of elevated blood pressures.  She continues to have occasional shortness of breath.  She also feels like her heart is racing or skipping a beat.  It happens every other week and worse when she is really tired.  It last for an hour and makes she short of breath.  It sometimes makes her lightheaded.  She also gets chest tightness that is fleeting..  She hasn't been exercising much lately.  She doesn't get chest pain with exertion but does get short of breath.    Past Medical History:  Diagnosis Date  . Breast cancer ([place])   . Cancer ([place])   . Coronary artery disease   . Depression   . DVT (deep venous thrombosis) ([place]) 11/15   left  femerol artery injury during resusciation.  [name] GERD (gastroesophageal reflux disease)   . HCAP (healthcare-associated pneumonia) 11/22/2014  . Heart murmur   . High cholesterol   . History of blood transfusion 09/2014   related to MI  . History of gout   . Hypertension   . Liver laceration 10/19/14  . Pulmonary edema 10/19/14  . Renal disorder   . Sleep apnea    "suppose to wear mask but I don't" (11/22/2014)  . STEMI (ST elevation myocardial infarction) ([place]) 10/19/14  . Type II diabetes mellitus ([place])   . Urticaria     Past Surgical History:  Procedure Laterality Date  . ABDOMINAL HYSTERECTOMY  ~ 2000  . APPLICATION OF WOUND VAC  10/2014   "over my naval" (11/22/2014)  . BREAST LUMPECTOMY WITH AXILLARY LYMPH NODE BIOPSY    . BREAST SURGERY    . [place]; 1982  . CORONARY ANGIOPLASTY WITH STENT PLACEMENT  09/2014   "multiple"/notes 11/22/2014  . EXTRACORPOREAL CIRCULATION    . Femerol artery repair Right 09/2014   pt insists it was a right femoral artery repair on 11/22/2014  . LAPAROSCOPIC ABDOMINAL EXPLORATION    . LEFT HEART CATH AND CORONARY ANGIOGRAPHY N/A 12/07/2017   Procedure: LEFT HEART CATH AND CORONARY ANGIOGRAPHY;  Surgeon: Leonie Man, MD;  Location: [place] [facility];  Service: Cardiovascular;  Laterality: N/A;  . LEFT VENTRICULAR ASSIST DEVICE     pt is not aware of this hx on 11/22/2014  . Liver laceration repair  09/2014   related to CPR/notes 11/22/2014  . TRACHEOSTOMY  09/2014   "closed on it's own when they took it out"  . TUBAL LIGATION  1982     Current Outpatient Medications  Medication Sig Dispense Refill  . acetaminophen (TYLENOL) 500 MG tablet Take 1,000 mg by mouth every 6 (six) hours as needed.    . Alirocumab (PRALUENT) 150 MG/ML SOAJ Inject 150 mg into the skin every 14 (fourteen) days. 2 mL 11  . aspirin EC 81 MG tablet Take 1 tablet (81 mg total) by mouth daily.    [name] BRILINTA 60 MG TABS tablet TAKE 1 TABLET (60 MG  TOTAL) BY MOUTH 2 (TWO) TIMES DAILY. 180 tablet 2  . EPINEPHrine (AUVI-Q) 0.3 mg/0.3 mL IJ SOAJ injection Inject 0.3 mLs (0.3 mg total) into the muscle as needed for anaphylaxis. 1 each 1  . exemestane (AROMASIN) 25 MG tablet Take 25 mg by mouth daily after breakfast.     . fluticasone (FLONASE) 50 MCG/ACT nasal spray 2 sprays per nostril at night for stuffy nose at 7pm 18.2 mL 5  . furosemide (LASIX) 40 MG tablet TAKE 1 TABLET AS NEEDED FOR SHORTNESS OF BREATH OR WEIGHT GAIN OF 2 POUNDS IN 24 HOURS OR 5 POUNDS IN 1 WEEK 30 tablet 3  . isosorbide mononitrate (IMDUR) 30 MG 24 hr tablet Take 1  tablet (30 mg total) by mouth daily. 90 tablet 3  . losartan (COZAAR) 25 MG tablet Take 1 tablet (25 mg total) by mouth daily. 90 tablet 1  . magnesium oxide (MAG-OX) 400 MG tablet TAKE 1 TABLET BY MOUTH EVERY DAY 90 tablet 0  . metFORMIN (GLUCOPHAGE-XR) 500 MG 24 hr tablet TAKE 1 TABLET BY MOUTH EVERY DAY WITH BREAKFAST 90 tablet 1  . metoprolol succinate (TOPROL-XL) 25 MG 24 hr tablet Take 1 tablet (25 mg total) by mouth daily. 90 tablet 3  . montelukast (SINGULAIR) 10 MG tablet Take 1 tablet (10 mg total) by mouth at bedtime. 90 tablet 3  . nitroGLYCERIN (NITROSTAT) 0.4 MG SL tablet Place 1 tablet (0.4 mg total) under the tongue every 5 (five) minutes as needed for chest pain. max of 3 if no relief call 911 25 tablet PRN  . spironolactone (ALDACTONE) 25 MG tablet Take 1 tablet (25 mg total) by mouth daily. 90 tablet 2  . triamcinolone cream (KENALOG) 0.1 % Apply 1 application topically 2 (two) times daily. 80 g 3  . blood glucose meter kit and supplies KIT by Does not apply route daily as needed. Dispense based on patient and insurance preference. Use up to four times daily as directed. (FOR ICD-9 250.00, 250.01).    [name] glucose blood test strip Use as instructed. E11.5. To check blood glucose daily. 100 each 12  . hydrALAZINE (APRESOLINE) 50 MG tablet Take 1 tablet (50 mg total) by mouth in the morning and at  bedtime. 180 tablet 3  . rosuvastatin (CRESTOR) 20 MG tablet Take 1 tablet by mouth 3 times per week 30 tablet 6   No current facility-administered medications for this visit.    Allergies:   Dilaudid [hydromorphone hcl], Hydromorphone, Epinephrine, and Lactose intolerance (gi)    Social History:  The patient  reports that she has never smoked. She has never used smokeless tobacco. She reports current alcohol use. She reports that she does not use drugs.   Family History:  The patient's family history includes Alzheimer's disease in her father; Asthma in her daughter; COPD in her mother; Diabetes in her brother and mother; Heart attack in her father; Hypertension in her mother.    ROS:  Please see the history of present illness.   Otherwise, review of systems are positive for none.   All other systems are reviewed and negative.    PHYSICAL EXAM: VS:  BP 135/77   Pulse 78   Ht 5' 2.5" (1.588 m)   Wt 192 lb (87.1 kg)   BMI 34.56 kg/m  , BMI Body mass index is 34.56 kg/m. GENERAL:  Well appearing HEENT: Pupils equal round and reactive, fundi not visualized, oral mucosa unremarkable NECK:  No jugular venous distention, waveform within normal limits, carotid upstroke brisk and symmetric, no bruits LUNGS:  Clear to auscultation bilaterally HEART:  RRR.  PMI not displaced or sustained, S1 and S2 within normal limits, no S3, no S4, no clicks, no rubs, III/VI systolic murmur anteriorly ABD:  Flat, positive bowel sounds normal in frequency in pitch, no bruits, no rebound, no guarding, no midline pulsatile mass, no hepatomegaly, no splenomegaly EXT:  2 plus pulses throughout, no edema, no cyanosis no clubbing SKIN:  No rashes no nodules NEURO:  Cranial nerves II through XII grossly intact, motor grossly intact throughout PSYCH:  Cognitively intact, oriented to person place and time   EKG:  EKG is ordered today. 02/11/16: Sinus arrhythmia.  Rate 71 bpm. Non-specific  ST-T changes. 10/05/16:  Sinus rhythm. Rate 85 bpm. Prior inferior infarct. 11/16/17: Sinus rhythm.  Rate 87 bpm.  Prior inferior infarct.  07/22/18: Sinu srhythm.  Rate 90 bpm.  Prior inferior infarct.   02/08/2019: Sinus rhythm.  Rate 80 bpm.  Prior inferior infarct. 03/12/2020: Sinus rhythm.  Rate 81 bpm.  Prior inferior infarct.  07/26/18: Study Conclusions  - Left ventricle: The cavity size was normal. Systolic function was   mildly reduced. The estimated ejection fraction was in the range   of 45% to 50%. Hypokinesis of the mid-apicalinferior and   inferoseptal myocardium. Doppler parameters are consistent with   abnormal left ventricular relaxation (grade 1 diastolic   dysfunction). Acoustic contrast opacification revealed no   evidence ofthrombus. - Mitral valve: There was moderate regurgitation directed   eccentrically and anteriorly. - Left atrium: The atrium was mildly dilated. - Atrial septum: No defect or patent foramen ovale was identified.  Impressions:  - Direct comparison to images from August 2917 shows no change in   wall motion pattern or overall LV EF. [name]  Lexiscan Myoview 11/24/17:  The left ventricular ejection fraction is moderately decreased (30-44%).  Nuclear stress EF: 36%.  There was no ST segment deviation noted during stress.  There is a large defect of severe severity present in the mid anterior, mid inferior, apical anterior, apical septal, apical inferior, apical lateral and apex location. The defect is partially reversible. This is consistent with infarct with small amount of peri infarct ischemia in the inferior and apical walls.  This is a high risk study.  Compared to prior study the EF has declined and there appears to be peri infarct ischemia in the inferior wall although this could also be variations in diaphragmatic attenuation artifact.   LHC 12/07/17:  Mid LM to Ost LAD DES Stent is 10% stenosed. -This entire segment is heavily calcified. There is a  significant step down after the stent at the takeoff of the diagonal branches.  Prox LAD lesion is 20% stenosed.  Ost Ramus lesion is 40% stenosed. Proc Circumflex ~40%.  There is moderate left ventricular systolic dysfunction. The left ventricular ejection fraction is 35-45% by visual estimate.  LV end diastolic pressure is moderately elevated. 22-24 mmHg  ETT 10/2018: No ischemia.  6.2 METS on Bruce protocol..   Recent Labs: No results found for requested labs within last 8760 hours.    Lipid Panel    Component Value Date/Time   CHOL 151 10/17/2019 0945   TRIG 78 10/17/2019 0945   HDL 51 10/17/2019 0945   CHOLHDL 3.0 10/17/2019 0945   CHOLHDL 3.1 10/05/2016 0937   VLDL 15 10/05/2016 0937   LDLCALC 85 10/17/2019 0945      Wt Readings from Last 3 Encounters:  12/12/20 192 lb (87.1 kg)  05/27/20 190 lb (86.2 kg)  04/08/20 192 lb (87.1 kg)      ASSESSMENT AND PLAN:  # CAD s/p STEMI: # Chronic angina: Ms. Celestine Prim had an ST elevation myocardial infarction with stents placed in the LAD/left main and left circumflex arteries.  She is on low dose ticagrelor indefinitely.  LHC 11/2017 showed patent stents with 20-40% lesions.  ETT ws negative.  No more chest pain.  # Chronic systolic and diastolic heart failure: # Moderate mitral regurgitation: LVEF 45-50%.  She is euvolemic.  Continue her antihypertensive regimen of losartan, Bidil, metoprolol, and spironolactone.    # Hypertensive heart disease: BP is above goal.  Increase hydralazine to 88m bid.  Continue metoprolol, Imdur, losartan and spironolactone.  Check BMP.  # Hyperlipidemia:  Rosuvastatin was reduced due to muscle cramping.  Lipids were well controlled but LDL was above goal on the last check..  Check lipids and CMP..  Continue Praluent and rosuvastatin.  Stop Zetia.  # OSA: Oral airway device.   # Palpitations:  Check CMP, CBC, magnesium and TSH.  30 day Zio.   Time spent: 3 minutes-Greater than  50% of this time was spent in counseling, explanation of diagnosis, planning of further management, and coordination of care.  Current medicines are reviewed at length with the patient today.  The patient does not have concerns regarding medicines.  The following changes have been made: none  Labs/ tests ordered today include:   Orders Placed This Encounter  Procedures  . CARDIAC EVENT MONITOR     Disposition:   FU with Skarlett Sedlacek C. Oval Linsey, MD, [facility] in 2 months    Signed, Noriko Macari C. Oval Linsey, MD, [facility]  12/12/2020 10:37 AM    [facility]

## 2020-12-20 ENCOUNTER — Ambulatory Visit (INDEPENDENT_AMBULATORY_CARE_PROVIDER_SITE_OTHER): Payer: Self-pay

## 2020-12-20 ENCOUNTER — Telehealth: Payer: Self-pay

## 2020-12-20 DIAGNOSIS — R002 Palpitations: Secondary | ICD-10-CM

## 2020-12-20 NOTE — Telephone Encounter (Signed)
Call to pt reference referral to [place] program with dates/times locations available Can do 10am on M/W x 12 wk at [place] [facility] appt set for Monday 12/23/20 at 10am.  Tentative start date 01/06/21.

## 2020-12-23 NOTE — Progress Notes (Signed)
[place] Report   Patient Details  Name: Keia Rask MRN: [id number] Date of Birth: [birth year] Age: 65 y.o. PCP: Forrest Moron, MD  Vitals:   12/23/20 1038  BP: 138/76  Pulse: 63  SpO2: 99%  Weight: 190 lb (86.2 kg)  Height: 5' 2.5" (1.588 m)      [facility] Eval - 12/23/20 1000      Referral    Referring Provider Oval Linsey    Reason for referral Hypertension;Obesitity/Overweight;Inactivity;Diabetes    Program Start Date --   Tentative 01/06/21 M/W 10am -1115am At [facility]     Measurement   Waist Circumference 46 inches    Hip Circumference 49.5 inches      Information for Trainer   Goals dev exercise habit, be able to do another heart walk    Current Exercise none    Orthopedic Concerns Right arm, Bil knees when climbing stairs    Pertinent Medical History MI, OSA, DM2, CAD, Hx of breast cancer    Restrictions/Precautions Diabetic snack before exercise    Medications that affect exercise Asthma inhaler;Medication causing dizziness/drowsiness;Beta blocker      Timed Up and Go (TUGS)   Timed Up and Go Low risk <9 seconds      Mobility and Daily Activities   I find it easy to walk up or down two or more flights of stairs. 1    I have no trouble taking out the trash. 3    I do housework such as vacuuming and dusting on my own without difficulty. 3    I can easily lift a gallon of milk (8lbs). 2    I can easily walk a mile. 1    I have no trouble reaching into high cupboards or reaching down to pick up something from the floor. 1    I do not have trouble doing out-door work such as Armed forces logistics/support/administrative officer, raking leaves, or gardening. 1      Mobility and Daily Activities   I feel younger than my age. 2    I feel independent. 3    I feel energetic. 2    I live an active life.  3    I feel strong. 2    I feel healthy. 2    I feel active as other people my age. 2      How fit and strong are you.   Fit and Strong Total Score 28          Past Medical  History:  Diagnosis Date  . Breast cancer ([place])   . Cancer ([place])   . Chronic combined systolic and diastolic heart failure ([place]) 01/30/2015  . Coronary artery disease   . Depression   . DVT (deep venous thrombosis) ([place]) 11/15   left femerol artery injury during resusciation.  [name] GERD (gastroesophageal reflux disease)   . HCAP (healthcare-associated pneumonia) 11/22/2014  . Heart murmur   . High cholesterol   . History of blood transfusion 09/2014   related to MI  . History of gout   . Hypertension   . Liver laceration 10/19/14  . Palpitations 12/12/2020  . Pulmonary edema 10/19/14  . Renal disorder   . Sleep apnea    "suppose to wear mask but I don't" (11/22/2014)  . STEMI (ST elevation myocardial infarction) ([place]) 10/19/14  . Type II diabetes mellitus ([place])   . Urticaria    Past Surgical History:  Procedure Laterality Date  . ABDOMINAL HYSTERECTOMY  ~ 2000  .  APPLICATION OF WOUND VAC  10/2014   "over my naval" (11/22/2014)  . BREAST LUMPECTOMY WITH AXILLARY LYMPH NODE BIOPSY    . BREAST SURGERY    . [place]; 1982  . CORONARY ANGIOPLASTY WITH STENT PLACEMENT  09/2014   "multiple"/notes 11/22/2014  . EXTRACORPOREAL CIRCULATION    . Femerol artery repair Right 09/2014   pt insists it was a right femoral artery repair on 11/22/2014  . LAPAROSCOPIC ABDOMINAL EXPLORATION    . LEFT HEART CATH AND CORONARY ANGIOGRAPHY N/A 12/07/2017   Procedure: LEFT HEART CATH AND CORONARY ANGIOGRAPHY;  Surgeon: Leonie Man, MD;  Location: [place] [facility];  Service: Cardiovascular;  Laterality: N/A;  . LEFT VENTRICULAR ASSIST DEVICE     pt is not aware of this hx on 11/22/2014  . Liver laceration repair  09/2014   related to CPR/notes 11/22/2014  . TRACHEOSTOMY  09/2014   "closed on it's own when they took it out"  . TUBAL LIGATION  1982   Social History   Tobacco Use  Smoking Status Never Smoker  Smokeless Tobacco Never Used     Will call the week before to  confirm start depending on covid numbers in community    Barnett Hatter 12/23/2020, 10:44 AM

## 2020-12-24 ENCOUNTER — Ambulatory Visit: Payer: Medicaid Other

## 2020-12-31 ENCOUNTER — Ambulatory Visit: Payer: Medicaid Other | Attending: Internal Medicine

## 2020-12-31 ENCOUNTER — Other Ambulatory Visit (HOSPITAL_BASED_OUTPATIENT_CLINIC_OR_DEPARTMENT_OTHER): Payer: Self-pay | Admitting: Internal Medicine

## 2020-12-31 DIAGNOSIS — Z23 Encounter for immunization: Secondary | ICD-10-CM

## 2020-12-31 MED FILL — PFIZER-BIONTECH COVID-19 VA: 30 | 21 days supply | Qty: 0 | Fill #0

## 2020-12-31 NOTE — Progress Notes (Signed)
   Covid-19 Vaccination Clinic  Name:  Marie Tanner    MRN: [id number] DOB: 07-17-1956  12/31/2020  [name] was observed post Covid-19 immunization for 15 minutes without incident. She was provided with Vaccine Information Sheet and instruction to access the V-Safe system. Vaccinated by [employer].  [name] was instructed to call 911 with any severe reactions post vaccine: [name] Difficulty breathing  . Swelling of face and throat  . A fast heartbeat  . A bad rash all over body  . Dizziness and weakness   Immunizations Administered    Name Date Dose VIS Date Route   PFIZER Comrnaty(Gray TOP) Covid-19 Vaccine 12/31/2020  1:04 PM 0.3 mL 11/07/2020 Intramuscular   Manufacturer: [employer]   Lot: [id number]   NDC: [phone]

## 2021-01-01 ENCOUNTER — Telehealth: Payer: Self-pay

## 2021-01-01 NOTE — Telephone Encounter (Signed)
Call placed to pt to confirm start of next PREP of 01/06/21 at 10am-1115am Agreeable to starting then

## 2021-01-04 ENCOUNTER — Encounter: Payer: Self-pay | Admitting: Internal Medicine

## 2021-01-04 NOTE — Progress Notes (Signed)
She had a 10 beat run of VT.  Talked with preventice.  They are attempting to reach to assess if symptomatic.

## 2021-01-06 ENCOUNTER — Other Ambulatory Visit: Payer: Self-pay | Admitting: *Deleted

## 2021-01-06 ENCOUNTER — Telehealth: Payer: Self-pay | Admitting: *Deleted

## 2021-01-06 DIAGNOSIS — I472 Ventricular tachycardia, unspecified: Secondary | ICD-10-CM

## 2021-01-06 NOTE — Telephone Encounter (Signed)
  Luisa Dago RN spoke with patient who had felt dizzy, fluttering, and sick from booster Will proceed with plan and recommended per Dr Terese Door with patient and she feels she can walk on treadmill. Aware of Covid screening prior  Message sent to scheduling to arrange  Skeet Latch, MD at 01/04/2021 11:28 PM  Status: Signed    May be from her Booster.  However, given her heart history, gets get CMP and magnesium.  Lexiscan Myoview .  If she thinks she can walk on treadmill then exercise Myoview is OK.  Thanks!     Chancy Milroy, MD at 01/04/2021 11:28 PM  Status: Signed    She had a 10 beat run of VT.  Talked with preventice.  They are attempting to reach to assess if symptomatic.

## 2021-01-06 NOTE — Telephone Encounter (Signed)
Patient scheduled 2/18

## 2021-01-06 NOTE — Progress Notes (Signed)
May be from her Booster.  However, given her heart history, gets get CMP and magnesium.  Lexiscan Myoview .  If she thinks she can walk on treadmill then exercise Myoview is OK.  Thanks!

## 2021-01-08 ENCOUNTER — Telehealth: Payer: Self-pay

## 2021-01-08 ENCOUNTER — Encounter: Payer: Self-pay | Admitting: *Deleted

## 2021-01-08 NOTE — Progress Notes (Signed)
This encounter was created in error - please disregard.

## 2021-01-08 NOTE — Telephone Encounter (Signed)
Text from pt this am.  Was to start [place] 01/08/21 however had an episode with heart and followed up with MD who told her to hold exercise for now until she has some follow up testing which is scheduled for next Friday.  Will reeval after results of testing known and any change in treatment plan.

## 2021-01-14 ENCOUNTER — Other Ambulatory Visit (HOSPITAL_COMMUNITY)
Admission: RE | Admit: 2021-01-14 | Discharge: 2021-01-14 | Disposition: A | Discharge status: Home / Self Care | Payer: Medicaid Other | Source: Ambulatory Visit | Attending: Cardiovascular Disease | Admitting: Cardiovascular Disease

## 2021-01-14 DIAGNOSIS — Z01812 Encounter for preprocedural laboratory examination: Secondary | ICD-10-CM | POA: Diagnosis not present

## 2021-01-14 DIAGNOSIS — Z20822 Contact with and (suspected) exposure to covid-19: Secondary | ICD-10-CM | POA: Diagnosis not present

## 2021-01-14 LAB — SARS CORONAVIRUS 2 (TAT 6-24 HRS): SARS Coronavirus 2: NEGATIVE

## 2021-01-16 ENCOUNTER — Telehealth (HOSPITAL_COMMUNITY): Payer: Self-pay | Admitting: *Deleted

## 2021-01-16 NOTE — Telephone Encounter (Signed)
Close encounter 

## 2021-01-17 ENCOUNTER — Ambulatory Visit (HOSPITAL_COMMUNITY)
Admission: RE | Admit: 2021-01-17 | Discharge: 2021-01-17 | Disposition: A | Discharge status: Home / Self Care | Payer: Self-pay | Source: Ambulatory Visit | Attending: Cardiology | Admitting: Cardiology

## 2021-01-17 ENCOUNTER — Other Ambulatory Visit: Payer: Self-pay

## 2021-01-17 DIAGNOSIS — I472 Ventricular tachycardia, unspecified: Secondary | ICD-10-CM

## 2021-01-17 LAB — MYOCARDIAL PERFUSION IMAGING
Estimated workload: 5.8 METS
Exercise duration (min): 5 min
Exercise duration (sec): 1 s
LV dias vol: 140 mL (ref 46–106)
LV sys vol: 85 mL
MPHR: 158 {beats}/min
Peak HR: 155 {beats}/min
Percent HR: 98 %
Rest HR: 81 {beats}/min
SDS: 5
SRS: 13
SSS: 17
TID: 1.17

## 2021-01-17 MED ORDER — TECHNETIUM TC 99M TETROFOSMIN IV KIT
9.8000 | PACK | Freq: Once | INTRAVENOUS | Status: AC | PRN
  Administered 2021-01-17: 9.8 via INTRAVENOUS
  Filled 2021-01-17: qty 10

## 2021-01-17 MED ORDER — TECHNETIUM TC 99M TETROFOSMIN IV KIT
28.5000 | PACK | Freq: Once | INTRAVENOUS | Status: AC | PRN
  Administered 2021-01-17: 28.5 via INTRAVENOUS
  Filled 2021-01-17: qty 29

## 2021-01-28 ENCOUNTER — Telehealth: Payer: Self-pay | Admitting: *Deleted

## 2021-01-28 DIAGNOSIS — R931 Abnormal findings on diagnostic imaging of heart and coronary circulation: Secondary | ICD-10-CM

## 2021-01-28 NOTE — Telephone Encounter (Signed)
-----   Message from Skeet Latch, MD sent at 01/23/2021  5:51 PM EST ----- Stress test shows her old heart attack but no new blockages.  It looks like her heart muscle might be little weaker.  Recommend getting an echo to assess.

## 2021-01-28 NOTE — Telephone Encounter (Signed)
Advised patient and scheduled Echo

## 2021-02-11 ENCOUNTER — Ambulatory Visit (HOSPITAL_COMMUNITY): Payer: Self-pay

## 2021-02-11 ENCOUNTER — Other Ambulatory Visit: Payer: Self-pay

## 2021-03-04 ENCOUNTER — Other Ambulatory Visit: Payer: Self-pay

## 2021-03-04 ENCOUNTER — Ambulatory Visit (HOSPITAL_COMMUNITY): Payer: Self-pay | Attending: Cardiology

## 2021-03-04 DIAGNOSIS — R931 Abnormal findings on diagnostic imaging of heart and coronary circulation: Secondary | ICD-10-CM | POA: Insufficient documentation

## 2021-03-04 LAB — ECHOCARDIOGRAM COMPLETE
Area-P 1/2: 4.21 cm2
S' Lateral: 3.25 cm

## 2021-03-04 MED ORDER — PERFLUTREN LIPID MICROSPHERE
2.0000 mL | INTRAVENOUS | Status: AC | PRN
  Administered 2021-03-04: 2 mL via INTRAVENOUS

## 2021-03-05 ENCOUNTER — Encounter: Payer: Self-pay | Admitting: *Deleted

## 2021-03-05 LAB — COMPREHENSIVE METABOLIC PANEL
ALT: 13 IU/L (ref 0–32)
AST: 17 IU/L (ref 0–40)
Albumin/Globulin Ratio: 1 — ABNORMAL LOW (ref 1.2–2.2)
Albumin: 4.3 g/dL (ref 3.8–4.8)
Alkaline Phosphatase: 74 IU/L (ref 44–121)
BUN/Creatinine Ratio: 18 (ref 12–28)
BUN: 13 mg/dL (ref 8–27)
Bilirubin Total: 0.5 mg/dL (ref 0.0–1.2)
CO2: 21 mmol/L (ref 20–29)
Calcium: 9.6 mg/dL (ref 8.7–10.3)
Chloride: 100 mmol/L (ref 96–106)
Creatinine, Ser: 0.71 mg/dL (ref 0.57–1.00)
Globulin, Total: 4.2 g/dL (ref 1.5–4.5)
Glucose: 100 mg/dL — ABNORMAL HIGH (ref 65–99)
Potassium: 4.9 mmol/L (ref 3.5–5.2)
Sodium: 135 mmol/L (ref 134–144)
Total Protein: 8.5 g/dL (ref 6.0–8.5)
eGFR: 96 mL/min/{1.73_m2} (ref >59)

## 2021-03-05 LAB — CBC
Hematocrit: 38.8 % (ref 34.0–46.6)
Hemoglobin: 12.7 g/dL (ref 11.1–15.9)
MCH: 27.3 pg (ref 26.6–33.0)
MCHC: 32.7 g/dL (ref 31.5–35.7)
MCV: 83 fL (ref 79–97)
Platelets: 285 10*3/uL (ref 150–450)
RBC: 4.65 x10E6/uL (ref 3.77–5.28)
RDW: 13.6 % (ref 11.7–15.4)
WBC: 3.1 10*3/uL — ABNORMAL LOW (ref 3.4–10.8)

## 2021-03-05 LAB — LIPID PANEL
Chol/HDL Ratio: 3 ratio (ref 0.0–4.4)
Cholesterol, Total: 166 mg/dL (ref 100–199)
HDL: 56 mg/dL (ref >39)
LDL Chol Calc (NIH): 97 mg/dL (ref 0–99)
Triglycerides: 69 mg/dL (ref 0–149)
VLDL Cholesterol Cal: 13 mg/dL (ref 5–40)

## 2021-03-05 LAB — MAGNESIUM: Magnesium: 1.8 mg/dL (ref 1.6–2.3)

## 2021-03-05 LAB — TSH: TSH: 2.99 u[IU]/mL (ref 0.450–4.500)

## 2021-03-06 ENCOUNTER — Other Ambulatory Visit: Payer: Self-pay

## 2021-03-06 ENCOUNTER — Ambulatory Visit (INDEPENDENT_AMBULATORY_CARE_PROVIDER_SITE_OTHER): Payer: Self-pay | Admitting: Cardiovascular Disease

## 2021-03-06 ENCOUNTER — Encounter: Payer: Self-pay | Admitting: Cardiovascular Disease

## 2021-03-06 VITALS — BP 132/78 | HR 88 | Ht 62.5 in | Wt 186.4 lb

## 2021-03-06 DIAGNOSIS — I340 Nonrheumatic mitral (valve) insufficiency: Secondary | ICD-10-CM

## 2021-03-06 DIAGNOSIS — I5042 Chronic combined systolic (congestive) and diastolic (congestive) heart failure: Secondary | ICD-10-CM

## 2021-03-06 DIAGNOSIS — Z5181 Encounter for therapeutic drug level monitoring: Secondary | ICD-10-CM

## 2021-03-06 DIAGNOSIS — R079 Chest pain, unspecified: Secondary | ICD-10-CM

## 2021-03-06 DIAGNOSIS — I213 ST elevation (STEMI) myocardial infarction of unspecified site: Secondary | ICD-10-CM

## 2021-03-06 DIAGNOSIS — E119 Type 2 diabetes mellitus without complications: Secondary | ICD-10-CM

## 2021-03-06 MED ORDER — SACUBITRIL-VALSARTAN 24-26 MG PO TABS: 1.0000 | ORAL_TABLET | Freq: Two times a day (BID) | ORAL | 3 refills | Status: DC

## 2021-03-06 MED ORDER — EMPAGLIFLOZIN 10 MG PO TABS: 10.0000 mg | ORAL_TABLET | Freq: Every day | ORAL | 5 refills | Status: DC

## 2021-03-06 NOTE — Assessment & Plan Note (Addendum)
She had an ST elevation MI with stents in LAD and left main as well as a circumflex.  Continue Imdur, metoprolol, and rosuvastatin.  She will be on low-dose ticagrelor indefinitely.  Patent stents with 20 to 40% lesions on cath 11/2017.  Lexiscan Myoview revealed infarct with mild peri-infarct ischemia 2022.    Continue medical management.

## 2021-03-06 NOTE — Progress Notes (Signed)
Cardiology Office Note   Date:  03/06/2021   ID:  Marie Tanner, DOB 07-10-1956, MRN [id number]  PCP:  Marie Moron, MD  Cardiologist:   Marie Latch, MD  Oncologist: Marie Bouche, MD Radiation Oncologist: Marie Horns, MD Surgical Oncologist: Marie Neas, MD  No chief complaint on file.   History of Present Illness: Marie Tanner is a 65 y.o. female with CAD s/p STEMI, hypertension, chronic systolic and diastolic heart failure, OSA not on CPAP, diabetes, prior DVT, breast cancer, and mitral regurgitation who presents for follow up.  In 2015 she had a STEMI and cardiac arrest. She was cared for at [facility]. She ultimately received 2 DES (LM/LAD and LCx).  She had a prolonged hospitalization requiring tracheostomy.  Her liver was lacerated during CPR.  She required surgical repair after femoral catheterization.  She also developed a DVT in the hospital.  One week after discharge she developed pneumonia and was admitted at [facility].  She has been seen in the hospital twice for chest pain and ruled out for MI.  She had a negative Myoview 05/2015.  She also had negative CT scans for PE.  Marie Tanner also underwent surgery for breast cancer on 02/21/16.  She completed her radiation in September 2017.  She was referred for sleep study which was abnormal.  However, she declined using the CPAP device and is in the process of being fitted for an oral airway.  Marie Tanner had an echo 06/2016 that showed LVEF 50-55% with grade 1 diastolic dysfunction.    Marie Tanner reported an episode of chest pain that occurred while driving.  She was referred for [facility] 11/24/17 that revealed LVEF 36% with anterior, anteroseptal, apical inferior and apical lateral defects.  There was concern for infarct with peri-infarct ischemia in the inferior and apical walls.  She underwent LHC 11/2017 that revealed patent stents that were heavily calcified and there was a step down after the takeoff of  the diagonal.  She had 40% RI, 40% proximal LCx and 20% prox LAD.  LVEF was 35-45% and LVEDP was elevated.  BiDil was added to her regimen for afterload reduction and poorly controlled hypertension.  She reported increased dyspnea on exertion and edema.  Lasix was increased for 2 days.  She was also referred for an echocardiogram 06/2018 that revealed LVEF 45 to 50% with hypokinesis of the mid to apical inferior and inferoseptal myocardium that was relatively unchanged from prior.  She was also started on spironolactone.  She had atypical chest pain and was referred for an ETT 10/2018 that was negative.  She achieved 6.2 METS on a Bruce protocol.    Marie Tanner reported episodes of dizziness.  Losartan was reduced to 12.5 mg she also had persistent episodes of chest discomfort that were atypical and occurred when lying down.  Thought to be secondary to GERD and her PPI was restarted.  At her last appointment Marie Tanner was struggling due to cost of medications.  She has been out of her BiDil, ticagrelor, and Praluent.  BiDil was switched to hydralazine and Imdur.  Her BP has been improving.  It has been as high as 170-180s She was started on patient assistance for ticagrelor and Praluent.    At her last appointment, her bp was above goal, so Hydralazine was increased. She called the office 01/19/2021 with chest pain and shortness of breath. She was referred for a nuclear stress test that revealed LVEF 39% with concern for infarct  with peri-infarct eschemia. She had a heart monitor 2/22 with 10 beats of NSVT. She also had an echo 03/19/2021 with LVEF 34-40% and moderate to severe mitral regurgitation.  She is doing well at this visit. She explains that after having the COVID booster she had an appointment to wear the heart monitor. While wearing the heart monitor a cardiac episode was detected. She states that she was sitting down when she felt left chest pain and discomfort during this episode. However she  denies feeling palpitations at that time. Her chest pain was intermittent and lasted 5-10 seconds, however soon after it subsided. She also notes having shortness of breath and nausea during the episode, but denies being diaphoretic. She also felt like she was about to vomit but she did not. She is taking Lasix a couple times per week to help with her lower extremity edema, and occasionally she will prop her feet up at night. She has not orthopnea or PND. She has not been participating in much formal exercise, due to being told to wait after her previous result from the Echo appointment.  She denies feeling "over-exerted" when walking. She has no shortness of breath, chest tightness, pressure, or pain while on her walks.  She has no lightheadedness or dizziness. Her at home blood pressure ranges in the 130s.  Past Medical History:  Diagnosis Date  . Breast cancer ([place])   . Cancer ([place])   . Chronic combined systolic and diastolic heart failure ([place]) 01/30/2015  . Coronary artery disease   . Depression   . DVT (deep venous thrombosis) ([place]) 11/15   left femerol artery injury during resusciation.  [name] GERD (gastroesophageal reflux disease)   . HCAP (healthcare-associated pneumonia) 11/22/2014  . Heart murmur   . High cholesterol   . History of blood transfusion 09/2014   related to MI  . History of gout   . Hypertension   . Liver laceration 10/19/14  . Palpitations 12/12/2020  . Pulmonary edema 10/19/14  . Renal disorder   . Sleep apnea    "suppose to wear mask but I don't" (11/22/2014)  . STEMI (ST elevation myocardial infarction) ([place]) 10/19/14  . Type II diabetes mellitus ([place])   . Urticaria     Past Surgical History:  Procedure Laterality Date  . ABDOMINAL HYSTERECTOMY  ~ 2000  . APPLICATION OF WOUND VAC  10/2014   "over my naval" (11/22/2014)  . BREAST LUMPECTOMY WITH AXILLARY LYMPH NODE BIOPSY    . BREAST SURGERY    . [place]; 1982  . CORONARY ANGIOPLASTY WITH STENT  PLACEMENT  09/2014   "multiple"/notes 11/22/2014  . EXTRACORPOREAL CIRCULATION    . Femerol artery repair Right 09/2014   pt insists it was a right femoral artery repair on 11/22/2014  . LAPAROSCOPIC ABDOMINAL EXPLORATION    . LEFT HEART CATH AND CORONARY ANGIOGRAPHY N/A 12/07/2017   Procedure: LEFT HEART CATH AND CORONARY ANGIOGRAPHY;  Surgeon: Leonie Man, MD;  Location: [facility];  Service: Cardiovascular;  Laterality: N/A;  . LEFT VENTRICULAR ASSIST DEVICE     pt is not aware of this hx on 11/22/2014  . Liver laceration repair  09/2014   related to CPR/notes 11/22/2014  . TRACHEOSTOMY  09/2014   "closed on it's own when they took it out"  . TUBAL LIGATION  1982     Current Outpatient Medications  Medication Sig Dispense Refill  . acetaminophen (TYLENOL) 500 MG tablet Take 1,000 mg by mouth every  6 (six) hours as needed.    . Alirocumab (PRALUENT) 150 MG/ML SOAJ Inject 150 mg into the skin every 14 (fourteen) days. 2 mL 11  . aspirin EC 81 MG tablet Take 1 tablet (81 mg total) by mouth daily.    . blood glucose meter kit and supplies KIT by Does not apply route daily as needed. Dispense based on patient and insurance preference. Use up to four times daily as directed. (FOR ICD-9 250.00, 250.01).    . BRILINTA 60 MG TABS tablet TAKE 1 TABLET (60 MG TOTAL) BY MOUTH 2 (TWO) TIMES DAILY. 180 tablet 2  . COVID-19 mRNA vaccine, Pfizer, 30 MCG/0.3ML injection INJECT AS DIRECTED .3 mL 0  . empagliflozin (JARDIANCE) 10 MG TABS tablet Take 1 tablet (10 mg total) by mouth daily before breakfast. 30 tablet 5  . EPINEPHrine (AUVI-Q) 0.3 mg/0.3 mL IJ SOAJ injection Inject 0.3 mLs (0.3 mg total) into the muscle as needed for anaphylaxis. 1 each 1  . exemestane (AROMASIN) 25 MG tablet Take 25 mg by mouth daily after breakfast.     . fluticasone (FLONASE) 50 MCG/ACT nasal spray 2 sprays per nostril at night for stuffy nose at 7pm 18.2 mL 5  . furosemide (LASIX) 40 MG tablet TAKE 1 TABLET  AS NEEDED FOR SHORTNESS OF BREATH OR WEIGHT GAIN OF 2 POUNDS IN 24 HOURS OR 5 POUNDS IN 1 WEEK 30 tablet 3  . glucose blood test strip Use as instructed. E11.5. To check blood glucose daily. 100 each 12  . magnesium oxide (MAG-OX) 400 MG tablet TAKE 1 TABLET BY MOUTH EVERY DAY 90 tablet 0  . metFORMIN (GLUCOPHAGE-XR) 500 MG 24 hr tablet TAKE 1 TABLET BY MOUTH EVERY DAY WITH BREAKFAST 90 tablet 1  . metoprolol succinate (TOPROL-XL) 25 MG 24 hr tablet Take 1 tablet (25 mg total) by mouth daily. 90 tablet 3  . montelukast (SINGULAIR) 10 MG tablet Take 1 tablet (10 mg total) by mouth at bedtime. 90 tablet 3  . nitroGLYCERIN (NITROSTAT) 0.4 MG SL tablet Place 1 tablet (0.4 mg total) under the tongue every 5 (five) minutes as needed for chest pain. max of 3 if no relief call 911 25 tablet PRN  . rosuvastatin (CRESTOR) 20 MG tablet Take 1 tablet by mouth 3 times per week 30 tablet 6  . sacubitril-valsartan (ENTRESTO) 24-26 MG Take 1 tablet by mouth 2 (two) times daily. 60 tablet 3  . spironolactone (ALDACTONE) 25 MG tablet Take 1 tablet (25 mg total) by mouth daily. 90 tablet 2  . triamcinolone cream (KENALOG) 0.1 % Apply 1 application topically 2 (two) times daily. 80 g 3  . isosorbide mononitrate (IMDUR) 30 MG 24 hr tablet Take 1 tablet (30 mg total) by mouth daily. 90 tablet 3   No current facility-administered medications for this visit.    Allergies:   Dilaudid [hydromorphone hcl], Hydromorphone, Epinephrine, and Lactose intolerance (gi)    Social History:  The patient  reports that she has never smoked. She has never used smokeless tobacco. She reports current alcohol use. She reports that she does not use drugs.   Family History:  The patient's family history includes Alzheimer's disease in her father; Asthma in her daughter; COPD in her mother; Diabetes in her brother and mother; Heart attack in her father; Hypertension in her mother.    ROS:  Please see the history of present illness.    Otherwise, review of systems are positive for left chest pain, shortness of breath, LE  edema, and nausea.   All other systems are reviewed and negative.    PHYSICAL EXAM: VS:  BP 132/78   Pulse 88   Ht 5' 2.5" (1.588 m)   Wt 186 lb 6.4 oz (84.6 kg)   SpO2 98%   BMI 33.55 kg/m  , BMI Body mass index is 33.55 kg/m. GENERAL:  Well appearing HEENT: Pupils equal round and reactive, fundi not visualized, oral mucosa unremarkable NECK:  No jugular venous distention, waveform within normal limits, carotid upstroke brisk and symmetric, no bruits LUNGS:  Clear to auscultation bilaterally HEART:  RRR.  PMI not displaced or sustained, S1 and S2 within normal limits, no S3, no S4, no clicks, no rubs, III/VI systolic murmur anteriorly ABD:  Flat, positive bowel sounds normal in frequency in pitch, no bruits, no rebound, no guarding, no midline pulsatile mass, no hepatomegaly, no splenomegaly EXT:  2 plus pulses throughout, no edema, no cyanosis no clubbing SKIN:  No rashes no nodules NEURO:  Cranial nerves II through XII grossly intact, motor grossly intact throughout PSYCH:  Cognitively intact, oriented to person place and time   EKG:  03/06/2021: EKG was not ordered today. 02/11/16: Sinus arrhythmia.  Rate 71 bpm. Non-specific ST-T changes. 10/05/16: Sinus rhythm. Rate 85 bpm. Prior inferior infarct. 11/16/17: Sinus rhythm.  Rate 87 bpm.  Prior inferior infarct.  07/22/18: Sinu srhythm.  Rate 90 bpm.  Prior inferior infarct.   02/08/2019: Sinus rhythm.  Rate 80 bpm.  Prior inferior infarct. 03/12/2020: Sinus rhythm.  Rate 81 bpm.  Prior inferior infarct.  07/26/18: Study Conclusions  - Left ventricle: The cavity size was normal. Systolic function was   mildly reduced. The estimated ejection fraction was in the range   of 45% to 50%. Hypokinesis of the mid-apicalinferior and   inferoseptal myocardium. Doppler parameters are consistent with   abnormal left ventricular relaxation (grade 1  diastolic   dysfunction). Acoustic contrast opacification revealed no   evidence ofthrombus. - Mitral valve: There was moderate regurgitation directed   eccentrically and anteriorly. - Left atrium: The atrium was mildly dilated. - Atrial septum: No defect or patent foramen ovale was identified.  Impressions:  - Direct comparison to images from August 2917 shows no change in   wall motion pattern or overall LV EF. [name]  Lexiscan Myoview 11/24/17:  The left ventricular ejection fraction is moderately decreased (30-44%).  Nuclear stress EF: 36%.  There was no ST segment deviation noted during stress.  There is a large defect of severe severity present in the mid anterior, mid inferior, apical anterior, apical septal, apical inferior, apical lateral and apex location. The defect is partially reversible. This is consistent with infarct with small amount of peri infarct ischemia in the inferior and apical walls.  This is a high risk study.  Compared to prior study the EF has declined and there appears to be peri infarct ischemia in the inferior wall although this could also be variations in diaphragmatic attenuation artifact.   LHC 12/07/17:  Mid LM to Ost LAD DES Stent is 10% stenosed. -This entire segment is heavily calcified. There is a significant step down after the stent at the takeoff of the diagonal branches.  Prox LAD lesion is 20% stenosed.  Ost Ramus lesion is 40% stenosed. Proc Circumflex ~40%.  There is moderate left ventricular systolic dysfunction. The left ventricular ejection fraction is 35-45% by visual estimate.  LV end diastolic pressure is moderately elevated. 22-24 mmHg  ETT 10/2018: No ischemia.  6.2  METS on Bruce protocol.[name]  ECHO 03/04/2021: 1. Consider TEE to further evaluate mitral regugitation, very eccentric  jet which may be more severe than depicted on color Doppler.  2. Left ventricular ejection fraction, by estimation, is 35 to 40%. The  left  ventricle has moderately decreased function. The left ventricle  demonstrates global hypokinesis. There is mild left ventricular  hypertrophy. Left ventricular diastolic  parameters are consistent with Grade I diastolic dysfunction (impaired  relaxation).  3. Right ventricular systolic function is normal. The right ventricular  size is normal.  4. The mitral valve is normal in structure. Moderate to severe mitral  valve regurgitation. No evidence of mitral stenosis.  5. The aortic valve is tricuspid. There is mild calcification of the  aortic valve. There is mild thickening of the aortic valve. Aortic valve  regurgitation is not visualized. No aortic stenosis is present.  6. The inferior vena cava is normal in size with greater than 50%  respiratory variability, suggesting right atrial pressure of 3 mmHg.  Non-telemetry monitor study 01/22/2021: 30 Day Event Monitor  Quality: Fair.  Baseline artifact. Predominant rhythm: sinus rhythm                         Average heart rate: 81 bpm Max heart rate: 157 bpm Min heart rate: 47 bpm  10 beats NSVT Patient did submit a symptom diary.  She reported shortness of breath and heart fluttering at which time sinus rhythm was noted.    01/17/2021 Stress Test : Study Highlights    The left ventricular ejection fraction is moderately decreased (30-44%).  Nuclear stress EF: 39%.  Blood pressure demonstrated a hypertensive response to exercise.  Downsloping ST segment depression of 0.5 mm was noted in the II and aVF leads (ST deviation beginning in recovery).  Defect 1: There is a medium defect of moderate severity present in the apical anterior and apex location.  Defect 2: There is a medium defect of severe severity present in the mid inferior and apical inferior location.  Findings consistent with prior myocardial infarction with peri-infarct ischemia.  This is an intermediate risk study.   Intermediate risk stress nuclear  study with evidence of previous infarction in the distal LAD artery and posterior descending artery distribution, with very limited peri-infarct ischemia.  Moderately reduced left ventricular global systolic function. No significant change since 2018.   Recent Labs: 03/05/2021: ALT 13; BUN 13; Creatinine, Ser 0.71; Hemoglobin 12.7; Magnesium 1.8; Platelets 285; Potassium 4.9; Sodium 135; TSH 2.990    Lipid Panel    Component Value Date/Time   CHOL 166 03/05/2021 1132   TRIG 69 03/05/2021 1132   HDL 56 03/05/2021 1132   CHOLHDL 3.0 03/05/2021 1132   CHOLHDL 3.1 10/05/2016 0937   VLDL 15 10/05/2016 0937   LDLCALC 97 03/05/2021 1132      Wt Readings from Last 3 Encounters:  03/06/21 186 lb 6.4 oz (84.6 kg)  01/17/21 190 lb (86.2 kg)  12/23/20 190 lb (86.2 kg)      ASSESSMENT AND PLAN: Mitral regurgitation Moderate to severe on TTE.  Patient agrees to getting a TEE to better assess.  Unclear if this is contributing to her worsening left ventricular function.  Type II diabetes mellitus (HCC) Start Jardiance 10 mg daily.  Continue Metformin.  STEMI (ST elevation myocardial infarction) [facility]) She had an ST elevation MI with stents in LAD and left main as well as a circumflex.  Continue Imdur, metoprolol,  and rosuvastatin.  She will be on low-dose ticagrelor indefinitely.  Patent stents with 20 to 40% lesions on cath 11/2017.  Lexiscan Myoview revealed infarct with mild peri-infarct ischemia 2022.    Continue medical management.  Chronic combined systolic and diastolic heart failure (HCC) LVEF is worsening to 35-40%.  Concern for mitral regurgitation contribution.  Will get TEE to assess.  Stop losartan and hydralazine.  We will start Entresto 24/26 mg twice daily.  Check a basic metabolic panel in a week.  We will also start Jardiance 10 mg daily.  Continue metoprolol, Imdur and spironolactone.  We discussed the risk of increased urinary tract infections and the fact that she will likely  need less furosemide.  Chest pain Stress test did show some peri-infarct ischemia.  She does continue to have some intermittent chest discomfort though not always clearly with exertion.  We will need to consider repeat cardiac cath, though her cath in 2019 continue medical management.  Was stable.    Current medicines are reviewed at length with the patient today.  The patient does not have concerns regarding medicines.  The following changes have been made: none  Labs/ tests ordered today include:   Orders Placed This Encounter  Procedures  . Basic metabolic panel    Disposition:   FU with Shylyn Younce C. Oval Linsey, MD, [facility] in 1 month.   I,Mathew Stumpf,acting as a Education administrator for Marie Latch, MD.,have documented all relevant documentation on the behalf of Marie Latch, MD,as directed by  Marie Latch, MD while in the presence of Marie Latch, MD.   Signed, [place]. Oval Linsey, MD, [facility]  03/06/2021 10:04 AM    [place]

## 2021-03-06 NOTE — Assessment & Plan Note (Signed)
Start Jardiance 10 mg daily.  Continue Metformin.

## 2021-03-06 NOTE — Assessment & Plan Note (Signed)
Stress test did show some peri-infarct ischemia.  She does continue to have some intermittent chest discomfort though not always clearly with exertion.  We will need to consider repeat cardiac cath, though her cath in 2019 continue medical management.  Was stable.

## 2021-03-06 NOTE — Patient Instructions (Addendum)
Medication Instructions:  STOP LOSARTAN   STOP HYDRALAZINE  START JARDIANCE 10 MG DAILY   STAR ENTRESTO 24-26 MG TWICE A DAY   *If you need a refill on your cardiac medications before your next appointment, please call your pharmacy*  Lab Work: COVID SCREENING 03/10/2021 AT 10:35 AM   BMET 1 WEEK AFTER STARTING NEW MEDICATIONS  If you have labs (blood work) drawn today and your tests are completely normal, you will receive your results only by: [name] MyChart Message (if you have MyChart) OR . A paper copy in the mail If you have any lab test that is abnormal or we need to change your treatment, we will call you to review the results.  Testing/Procedures: TEE AT [facility], SEE BELOW   Follow-Up: At [facility], you and your health needs are our priority.  As part of our continuing mission to provide you with exceptional heart care, we have created designated Provider Care Teams.  These Care Teams include your primary Cardiologist (physician) and Advanced Practice Providers (APPs -  Physician Assistants and Nurse Practitioners) who all work together to provide you with the care you need, when you need it.  We recommend signing up for the patient portal called "MyChart".  Sign up information is provided on this After Visit Summary.  MyChart is used to connect with patients for Virtual Visits (Telemedicine).  Patients are able to view lab/test results, encounter notes, upcoming appointments, etc.  Non-urgent messages can be sent to your provider as well.   To learn more about what you can do with MyChart, go to [email or web address].    Your next appointment:   5-03-31-2021 AT 10:00 AM   Other Instructions   You are scheduled for a TEE on 03/13/2021 with Dr. Margaretann Loveless.  Please arrive at the [facility] (Main Entrance A) at [facility]: [address] [place], [place] [address] at 10:15 am. (1 hour prior to procedure unless lab work is needed; if lab work is needed arrive  1.5 hours ahead)  DIET: Nothing to eat or drink after midnight except a sip of water with medications (see medication instructions below)  Medication Instructions: Hold FUROSEMIDE AND SPIRONOLACTONE MORNING OF    Labs:   COVID SCREENING Monday   You must have a responsible person to drive you home and stay in the waiting area during your procedure. Failure to do so could result in cancellation.  Bring your insurance cards.  *Special Note: Every effort is made to have your procedure done on time. Occasionally there are emergencies that occur at the hospital that may cause delays. Please be patient if a delay does occur.    Transesophageal Echocardiogram Transesophageal echocardiogram (TEE) is a test that uses sound waves to take pictures of your heart. TEE is done by passing a small probe attached to a flexible tube down the part of the body that moves food from your mouth to your stomach (esophagus). The pictures give clear images of your heart. This can help your doctor see if there are problems with your heart. Tell a doctor about:  Any allergies you have.  All medicines you are taking. This includes vitamins, herbs, eye drops, creams, and over-the-counter medicines.  Any problems you or family members have had with anesthetic medicines.  Any blood disorders you have.  Any surgeries you have had.  Any medical conditions you have.  Any swallowing problems.  Whether you have or have had a blockage in the part of the  body that moves food from your mouth to your stomach.  Whether you are pregnant or may be pregnant. What are the risks? In general, this is a safe procedure. But, problems may occur, such as:  Damage to nearby structures or organs.  A tear in the part of the body that moves food from your mouth to your stomach.  Irregular heartbeat.  Hoarse voice or trouble swallowing.  Bleeding. What happens before the procedure? Medicines  Ask your doctor about  changing or stopping: ? Your normal medicines. ? Vitamins, herbs, and supplements. ? Over-the-counter medicines.  Do not take aspirin or ibuprofen unless you are told to. General instructions  Follow instructions from your doctor about what you cannot eat or drink.  You will take out any dentures or dental retainers.  Plan to have a responsible adult take you home from the hospital or clinic.  Plan to have a responsible adult care for you for the time you are told after you leave the hospital or clinic. This is important. What happens during the procedure?  An IV will be put into one of your veins.  You may be given: ? A sedative. This medicine helps you relax. ? A medicine to numb the back of your throat. This may be sprayed or gargled.  Your blood pressure, heart rate, and breathing will be watched.  You may be asked to lie on your left side.  A bite block will be placed in your mouth. This keeps you from biting the tube.  The tip of the probe will be placed into the back of your mouth.  You will be asked to swallow.  Your doctor will take pictures of your heart.  The probe and bite block will be taken out after the test is done. The procedure may vary among doctors and hospitals.   What can I expect after the procedure?  You will be monitored until you leave the hospital or clinic. This includes checking your blood pressure, heart rate, breathing rate, and blood oxygen level.  Your throat may feel sore and numb. This will get better over time. You will not be allowed to eat or drink until the numbness has gone away.  It is common to have a sore throat for a day or two.  It is up to you to get the results of your procedure. Ask how to get your results when they are ready. Follow these instructions at home:  If you were given a sedative during your procedure, do not drive or use machines until your doctor says that it is safe.  Return to your normal activities when  your doctor says that it is safe.  Keep all follow-up visits. Summary  TEE is a test that uses sound waves to take pictures of your heart.  You will be given a medicine to help you relax.  Do not drive or use machines until your doctor says that it is safe. This information is not intended to replace advice given to you by your health care provider. Make sure you discuss any questions you have with your health care provider. Document Revised: 07/09/2020 Document Reviewed: 07/09/2020 Elsevier Patient Education  2021 [employer].

## 2021-03-06 NOTE — Assessment & Plan Note (Signed)
Moderate to severe on TTE.  Patient agrees to getting a TEE to better assess.  Unclear if this is contributing to her worsening left ventricular function.

## 2021-03-06 NOTE — H&P (View-Only) (Signed)
Cardiology Office Note   Date:  03/06/2021   ID:  Marie Tanner, DOB 07-10-1956, MRN [id number]  PCP:  Forrest Moron, MD  Cardiologist:   Skeet Latch, MD  Oncologist: Samuel Bouche, MD Radiation Oncologist: Ladona Horns, MD Surgical Oncologist: Nancie Neas, MD  No chief complaint on file.   History of Present Illness: Marie Tanner is a 65 y.o. female with CAD s/p STEMI, hypertension, chronic systolic and diastolic heart failure, OSA not on CPAP, diabetes, prior DVT, breast cancer, and mitral regurgitation who presents for follow up.  In 2015 she had a STEMI and cardiac arrest. She was cared for at [facility]. She ultimately received 2 DES (LM/LAD and LCx).  She had a prolonged hospitalization requiring tracheostomy.  Her liver was lacerated during CPR.  She required surgical repair after femoral catheterization.  She also developed a DVT in the hospital.  One week after discharge she developed pneumonia and was admitted at [facility].  She has been seen in the hospital twice for chest pain and ruled out for MI.  She had a negative Myoview 05/2015.  She also had negative CT scans for PE.  Marie Tanner also underwent surgery for breast cancer on 02/21/16.  She completed her radiation in September 2017.  She was referred for sleep study which was abnormal.  However, she declined using the CPAP device and is in the process of being fitted for an oral airway.  Marie Tanner had an echo 06/2016 that showed LVEF 50-55% with grade 1 diastolic dysfunction.    Marie Tanner reported an episode of chest pain that occurred while driving.  She was referred for [facility] 11/24/17 that revealed LVEF 36% with anterior, anteroseptal, apical inferior and apical lateral defects.  There was concern for infarct with peri-infarct ischemia in the inferior and apical walls.  She underwent LHC 11/2017 that revealed patent stents that were heavily calcified and there was a step down after the takeoff of  the diagonal.  She had 40% RI, 40% proximal LCx and 20% prox LAD.  LVEF was 35-45% and LVEDP was elevated.  BiDil was added to her regimen for afterload reduction and poorly controlled hypertension.  She reported increased dyspnea on exertion and edema.  Lasix was increased for 2 days.  She was also referred for an echocardiogram 06/2018 that revealed LVEF 45 to 50% with hypokinesis of the mid to apical inferior and inferoseptal myocardium that was relatively unchanged from prior.  She was also started on spironolactone.  She had atypical chest pain and was referred for an ETT 10/2018 that was negative.  She achieved 6.2 METS on a Bruce protocol.    Marie Tanner reported episodes of dizziness.  Losartan was reduced to 12.5 mg she also had persistent episodes of chest discomfort that were atypical and occurred when lying down.  Thought to be secondary to GERD and her PPI was restarted.  At her last appointment Marie Tanner was struggling due to cost of medications.  She has been out of her BiDil, ticagrelor, and Praluent.  BiDil was switched to hydralazine and Imdur.  Her BP has been improving.  It has been as high as 170-180s She was started on patient assistance for ticagrelor and Praluent.    At her last appointment, her bp was above goal, so Hydralazine was increased. She called the office 01/19/2021 with chest pain and shortness of breath. She was referred for a nuclear stress test that revealed LVEF 39% with concern for infarct  with peri-infarct eschemia. She had a heart monitor 2/22 with 10 beats of NSVT. She also had an echo 03/19/2021 with LVEF 34-40% and moderate to severe mitral regurgitation.  She is doing well at this visit. She explains that after having the COVID booster she had an appointment to wear the heart monitor. While wearing the heart monitor a cardiac episode was detected. She states that she was sitting down when she felt left chest pain and discomfort during this episode. However she  denies feeling palpitations at that time. Her chest pain was intermittent and lasted 5-10 seconds, however soon after it subsided. She also notes having shortness of breath and nausea during the episode, but denies being diaphoretic. She also felt like she was about to vomit but she did not. She is taking Lasix a couple times per week to help with her lower extremity edema, and occasionally she will prop her feet up at night. She has not orthopnea or PND. She has not been participating in much formal exercise, due to being told to wait after her previous result from the Echo appointment.  She denies feeling "over-exerted" when walking. She has no shortness of breath, chest tightness, pressure, or pain while on her walks.  She has no lightheadedness or dizziness. Her at home blood pressure ranges in the 130s.  Past Medical History:  Diagnosis Date  . Breast cancer ([place])   . Cancer ([place])   . Chronic combined systolic and diastolic heart failure ([place]) 01/30/2015  . Coronary artery disease   . Depression   . DVT (deep venous thrombosis) ([place]) 11/15   left femerol artery injury during resusciation.  [name] GERD (gastroesophageal reflux disease)   . HCAP (healthcare-associated pneumonia) 11/22/2014  . Heart murmur   . High cholesterol   . History of blood transfusion 09/2014   related to MI  . History of gout   . Hypertension   . Liver laceration 10/19/14  . Palpitations 12/12/2020  . Pulmonary edema 10/19/14  . Renal disorder   . Sleep apnea    "suppose to wear mask but I don't" (11/22/2014)  . STEMI (ST elevation myocardial infarction) ([place]) 10/19/14  . Type II diabetes mellitus ([place])   . Urticaria     Past Surgical History:  Procedure Laterality Date  . ABDOMINAL HYSTERECTOMY  ~ 2000  . APPLICATION OF WOUND VAC  10/2014   "over my naval" (11/22/2014)  . BREAST LUMPECTOMY WITH AXILLARY LYMPH NODE BIOPSY    . BREAST SURGERY    . [place]; 1982  . CORONARY ANGIOPLASTY WITH STENT  PLACEMENT  09/2014   "multiple"/notes 11/22/2014  . EXTRACORPOREAL CIRCULATION    . Femerol artery repair Right 09/2014   pt insists it was a right femoral artery repair on 11/22/2014  . LAPAROSCOPIC ABDOMINAL EXPLORATION    . LEFT HEART CATH AND CORONARY ANGIOGRAPHY N/A 12/07/2017   Procedure: LEFT HEART CATH AND CORONARY ANGIOGRAPHY;  Surgeon: Leonie Man, MD;  Location: [facility];  Service: Cardiovascular;  Laterality: N/A;  . LEFT VENTRICULAR ASSIST DEVICE     pt is not aware of this hx on 11/22/2014  . Liver laceration repair  09/2014   related to CPR/notes 11/22/2014  . TRACHEOSTOMY  09/2014   "closed on it's own when they took it out"  . TUBAL LIGATION  1982     Current Outpatient Medications  Medication Sig Dispense Refill  . acetaminophen (TYLENOL) 500 MG tablet Take 1,000 mg by mouth every  6 (six) hours as needed.    . Alirocumab (PRALUENT) 150 MG/ML SOAJ Inject 150 mg into the skin every 14 (fourteen) days. 2 mL 11  . aspirin EC 81 MG tablet Take 1 tablet (81 mg total) by mouth daily.    . blood glucose meter kit and supplies KIT by Does not apply route daily as needed. Dispense based on patient and insurance preference. Use up to four times daily as directed. (FOR ICD-9 250.00, 250.01).    . BRILINTA 60 MG TABS tablet TAKE 1 TABLET (60 MG TOTAL) BY MOUTH 2 (TWO) TIMES DAILY. 180 tablet 2  . COVID-19 mRNA vaccine, Pfizer, 30 MCG/0.3ML injection INJECT AS DIRECTED .3 mL 0  . empagliflozin (JARDIANCE) 10 MG TABS tablet Take 1 tablet (10 mg total) by mouth daily before breakfast. 30 tablet 5  . EPINEPHrine (AUVI-Q) 0.3 mg/0.3 mL IJ SOAJ injection Inject 0.3 mLs (0.3 mg total) into the muscle as needed for anaphylaxis. 1 each 1  . exemestane (AROMASIN) 25 MG tablet Take 25 mg by mouth daily after breakfast.     . fluticasone (FLONASE) 50 MCG/ACT nasal spray 2 sprays per nostril at night for stuffy nose at 7pm 18.2 mL 5  . furosemide (LASIX) 40 MG tablet TAKE 1 TABLET  AS NEEDED FOR SHORTNESS OF BREATH OR WEIGHT GAIN OF 2 POUNDS IN 24 HOURS OR 5 POUNDS IN 1 WEEK 30 tablet 3  . glucose blood test strip Use as instructed. E11.5. To check blood glucose daily. 100 each 12  . magnesium oxide (MAG-OX) 400 MG tablet TAKE 1 TABLET BY MOUTH EVERY DAY 90 tablet 0  . metFORMIN (GLUCOPHAGE-XR) 500 MG 24 hr tablet TAKE 1 TABLET BY MOUTH EVERY DAY WITH BREAKFAST 90 tablet 1  . metoprolol succinate (TOPROL-XL) 25 MG 24 hr tablet Take 1 tablet (25 mg total) by mouth daily. 90 tablet 3  . montelukast (SINGULAIR) 10 MG tablet Take 1 tablet (10 mg total) by mouth at bedtime. 90 tablet 3  . nitroGLYCERIN (NITROSTAT) 0.4 MG SL tablet Place 1 tablet (0.4 mg total) under the tongue every 5 (five) minutes as needed for chest pain. max of 3 if no relief call 911 25 tablet PRN  . rosuvastatin (CRESTOR) 20 MG tablet Take 1 tablet by mouth 3 times per week 30 tablet 6  . sacubitril-valsartan (ENTRESTO) 24-26 MG Take 1 tablet by mouth 2 (two) times daily. 60 tablet 3  . spironolactone (ALDACTONE) 25 MG tablet Take 1 tablet (25 mg total) by mouth daily. 90 tablet 2  . triamcinolone cream (KENALOG) 0.1 % Apply 1 application topically 2 (two) times daily. 80 g 3  . isosorbide mononitrate (IMDUR) 30 MG 24 hr tablet Take 1 tablet (30 mg total) by mouth daily. 90 tablet 3   No current facility-administered medications for this visit.    Allergies:   Dilaudid [hydromorphone hcl], Hydromorphone, Epinephrine, and Lactose intolerance (gi)    Social History:  The patient  reports that she has never smoked. She has never used smokeless tobacco. She reports current alcohol use. She reports that she does not use drugs.   Family History:  The patient's family history includes Alzheimer's disease in her father; Asthma in her daughter; COPD in her mother; Diabetes in her brother and mother; Heart attack in her father; Hypertension in her mother.    ROS:  Please see the history of present illness.    Otherwise, review of systems are positive for left chest pain, shortness of breath, LE  edema, and nausea.   All other systems are reviewed and negative.    PHYSICAL EXAM: VS:  BP 132/78   Pulse 88   Ht 5' 2.5" (1.588 m)   Wt 186 lb 6.4 oz (84.6 kg)   SpO2 98%   BMI 33.55 kg/m  , BMI Body mass index is 33.55 kg/m. GENERAL:  Well appearing HEENT: Pupils equal round and reactive, fundi not visualized, oral mucosa unremarkable NECK:  No jugular venous distention, waveform within normal limits, carotid upstroke brisk and symmetric, no bruits LUNGS:  Clear to auscultation bilaterally HEART:  RRR.  PMI not displaced or sustained, S1 and S2 within normal limits, no S3, no S4, no clicks, no rubs, III/VI systolic murmur anteriorly ABD:  Flat, positive bowel sounds normal in frequency in pitch, no bruits, no rebound, no guarding, no midline pulsatile mass, no hepatomegaly, no splenomegaly EXT:  2 plus pulses throughout, no edema, no cyanosis no clubbing SKIN:  No rashes no nodules NEURO:  Cranial nerves II through XII grossly intact, motor grossly intact throughout PSYCH:  Cognitively intact, oriented to person place and time   EKG:  03/06/2021: EKG was not ordered today. 02/11/16: Sinus arrhythmia.  Rate 71 bpm. Non-specific ST-T changes. 10/05/16: Sinus rhythm. Rate 85 bpm. Prior inferior infarct. 11/16/17: Sinus rhythm.  Rate 87 bpm.  Prior inferior infarct.  07/22/18: Sinu srhythm.  Rate 90 bpm.  Prior inferior infarct.   02/08/2019: Sinus rhythm.  Rate 80 bpm.  Prior inferior infarct. 03/12/2020: Sinus rhythm.  Rate 81 bpm.  Prior inferior infarct.  07/26/18: Study Conclusions  - Left ventricle: The cavity size was normal. Systolic function was   mildly reduced. The estimated ejection fraction was in the range   of 45% to 50%. Hypokinesis of the mid-apicalinferior and   inferoseptal myocardium. Doppler parameters are consistent with   abnormal left ventricular relaxation (grade 1  diastolic   dysfunction). Acoustic contrast opacification revealed no   evidence ofthrombus. - Mitral valve: There was moderate regurgitation directed   eccentrically and anteriorly. - Left atrium: The atrium was mildly dilated. - Atrial septum: No defect or patent foramen ovale was identified.  Impressions:  - Direct comparison to images from August 2917 shows no change in   wall motion pattern or overall LV EF. [name]  Lexiscan Myoview 11/24/17:  The left ventricular ejection fraction is moderately decreased (30-44%).  Nuclear stress EF: 36%.  There was no ST segment deviation noted during stress.  There is a large defect of severe severity present in the mid anterior, mid inferior, apical anterior, apical septal, apical inferior, apical lateral and apex location. The defect is partially reversible. This is consistent with infarct with small amount of peri infarct ischemia in the inferior and apical walls.  This is a high risk study.  Compared to prior study the EF has declined and there appears to be peri infarct ischemia in the inferior wall although this could also be variations in diaphragmatic attenuation artifact.   LHC 12/07/17:  Mid LM to Ost LAD DES Stent is 10% stenosed. -This entire segment is heavily calcified. There is a significant step down after the stent at the takeoff of the diagonal branches.  Prox LAD lesion is 20% stenosed.  Ost Ramus lesion is 40% stenosed. Proc Circumflex ~40%.  There is moderate left ventricular systolic dysfunction. The left ventricular ejection fraction is 35-45% by visual estimate.  LV end diastolic pressure is moderately elevated. 22-24 mmHg  ETT 10/2018: No ischemia.  6.2  METS on Bruce protocol.[name]  ECHO 03/04/2021: 1. Consider TEE to further evaluate mitral regugitation, very eccentric  jet which may be more severe than depicted on color Doppler.  2. Left ventricular ejection fraction, by estimation, is 35 to 40%. The  left  ventricle has moderately decreased function. The left ventricle  demonstrates global hypokinesis. There is mild left ventricular  hypertrophy. Left ventricular diastolic  parameters are consistent with Grade I diastolic dysfunction (impaired  relaxation).  3. Right ventricular systolic function is normal. The right ventricular  size is normal.  4. The mitral valve is normal in structure. Moderate to severe mitral  valve regurgitation. No evidence of mitral stenosis.  5. The aortic valve is tricuspid. There is mild calcification of the  aortic valve. There is mild thickening of the aortic valve. Aortic valve  regurgitation is not visualized. No aortic stenosis is present.  6. The inferior vena cava is normal in size with greater than 50%  respiratory variability, suggesting right atrial pressure of 3 mmHg.  Non-telemetry monitor study 01/22/2021: 30 Day Event Monitor  Quality: Fair.  Baseline artifact. Predominant rhythm: sinus rhythm                         Average heart rate: 81 bpm Max heart rate: 157 bpm Min heart rate: 47 bpm  10 beats NSVT Patient did submit a symptom diary.  She reported shortness of breath and heart fluttering at which time sinus rhythm was noted.    01/17/2021 Stress Test : Study Highlights    The left ventricular ejection fraction is moderately decreased (30-44%).  Nuclear stress EF: 39%.  Blood pressure demonstrated a hypertensive response to exercise.  Downsloping ST segment depression of 0.5 mm was noted in the II and aVF leads (ST deviation beginning in recovery).  Defect 1: There is a medium defect of moderate severity present in the apical anterior and apex location.  Defect 2: There is a medium defect of severe severity present in the mid inferior and apical inferior location.  Findings consistent with prior myocardial infarction with peri-infarct ischemia.  This is an intermediate risk study.   Intermediate risk stress nuclear  study with evidence of previous infarction in the distal LAD artery and posterior descending artery distribution, with very limited peri-infarct ischemia.  Moderately reduced left ventricular global systolic function. No significant change since 2018.   Recent Labs: 03/05/2021: ALT 13; BUN 13; Creatinine, Ser 0.71; Hemoglobin 12.7; Magnesium 1.8; Platelets 285; Potassium 4.9; Sodium 135; TSH 2.990    Lipid Panel    Component Value Date/Time   CHOL 166 03/05/2021 1132   TRIG 69 03/05/2021 1132   HDL 56 03/05/2021 1132   CHOLHDL 3.0 03/05/2021 1132   CHOLHDL 3.1 10/05/2016 0937   VLDL 15 10/05/2016 0937   LDLCALC 97 03/05/2021 1132      Wt Readings from Last 3 Encounters:  03/06/21 186 lb 6.4 oz (84.6 kg)  01/17/21 190 lb (86.2 kg)  12/23/20 190 lb (86.2 kg)      ASSESSMENT AND PLAN: Mitral regurgitation Moderate to severe on TTE.  Patient agrees to getting a TEE to better assess.  Unclear if this is contributing to her worsening left ventricular function.  Type II diabetes mellitus (HCC) Start Jardiance 10 mg daily.  Continue Metformin.  STEMI (ST elevation myocardial infarction) [facility]) She had an ST elevation MI with stents in LAD and left main as well as a circumflex.  Continue Imdur, metoprolol,  and rosuvastatin.  She will be on low-dose ticagrelor indefinitely.  Patent stents with 20 to 40% lesions on cath 11/2017.  Lexiscan Myoview revealed infarct with mild peri-infarct ischemia 2022.    Continue medical management.  Chronic combined systolic and diastolic heart failure (HCC) LVEF is worsening to 35-40%.  Concern for mitral regurgitation contribution.  Will get TEE to assess.  Stop losartan and hydralazine.  We will start Entresto 24/26 mg twice daily.  Check a basic metabolic panel in a week.  We will also start Jardiance 10 mg daily.  Continue metoprolol, Imdur and spironolactone.  We discussed the risk of increased urinary tract infections and the fact that she will likely  need less furosemide.  Chest pain Stress test did show some peri-infarct ischemia.  She does continue to have some intermittent chest discomfort though not always clearly with exertion.  We will need to consider repeat cardiac cath, though her cath in 2019 continue medical management.  Was stable.    Current medicines are reviewed at length with the patient today.  The patient does not have concerns regarding medicines.  The following changes have been made: none  Labs/ tests ordered today include:   Orders Placed This Encounter  Procedures  . Basic metabolic panel    Disposition:   FU with Delinda Malan C. Oval Linsey, MD, [facility] in 1 month.   I,Mathew Stumpf,acting as a Education administrator for Skeet Latch, MD.,have documented all relevant documentation on the behalf of Skeet Latch, MD,as directed by  Skeet Latch, MD while in the presence of Skeet Latch, MD.   Signed, [place]. Oval Linsey, MD, [facility]  03/06/2021 10:04 AM    [place]

## 2021-03-06 NOTE — Assessment & Plan Note (Signed)
LVEF is worsening to 35-40%.  Concern for mitral regurgitation contribution.  Will get TEE to assess.  Stop losartan and hydralazine.  We will start Entresto 24/26 mg twice daily.  Check a basic metabolic panel in a week.  We will also start Jardiance 10 mg daily.  Continue metoprolol, Imdur and spironolactone.  We discussed the risk of increased urinary tract infections and the fact that she will likely need less furosemide.

## 2021-03-10 ENCOUNTER — Other Ambulatory Visit (HOSPITAL_COMMUNITY): Payer: Medicaid Other

## 2021-03-10 ENCOUNTER — Other Ambulatory Visit (HOSPITAL_COMMUNITY)
Admission: RE | Admit: 2021-03-10 | Discharge: 2021-03-10 | Disposition: A | Discharge status: Home / Self Care | Payer: Medicaid Other | Source: Ambulatory Visit | Attending: Internal Medicine | Admitting: Internal Medicine

## 2021-03-10 DIAGNOSIS — Z01812 Encounter for preprocedural laboratory examination: Secondary | ICD-10-CM | POA: Diagnosis present

## 2021-03-10 DIAGNOSIS — Z20822 Contact with and (suspected) exposure to covid-19: Secondary | ICD-10-CM | POA: Diagnosis not present

## 2021-03-10 LAB — SARS CORONAVIRUS 2 (TAT 6-24 HRS): SARS Coronavirus 2: NEGATIVE

## 2021-03-12 ENCOUNTER — Other Ambulatory Visit: Payer: Self-pay | Admitting: *Deleted

## 2021-03-12 DIAGNOSIS — I340 Nonrheumatic mitral (valve) insufficiency: Secondary | ICD-10-CM

## 2021-03-13 ENCOUNTER — Ambulatory Visit (HOSPITAL_COMMUNITY): Payer: Self-pay | Admitting: Certified Registered Nurse Anesthetist

## 2021-03-13 ENCOUNTER — Ambulatory Visit (HOSPITAL_COMMUNITY)
Admission: RE | Admit: 2021-03-13 | Discharge: 2021-03-13 | Disposition: A | Discharge status: Home / Self Care | Payer: Self-pay | Attending: Internal Medicine | Admitting: Internal Medicine

## 2021-03-13 ENCOUNTER — Encounter (HOSPITAL_COMMUNITY): Payer: Self-pay | Admitting: Internal Medicine

## 2021-03-13 ENCOUNTER — Ambulatory Visit (HOSPITAL_BASED_OUTPATIENT_CLINIC_OR_DEPARTMENT_OTHER): Payer: Self-pay

## 2021-03-13 ENCOUNTER — Other Ambulatory Visit: Payer: Self-pay

## 2021-03-13 ENCOUNTER — Encounter (HOSPITAL_COMMUNITY)
Admission: RE | Disposition: A | Discharge status: Home / Self Care | Payer: Self-pay | Source: Home / Self Care | Attending: Internal Medicine

## 2021-03-13 DIAGNOSIS — Z888 Allergy status to other drugs, medicaments and biological substances status: Secondary | ICD-10-CM | POA: Insufficient documentation

## 2021-03-13 DIAGNOSIS — I5042 Chronic combined systolic (congestive) and diastolic (congestive) heart failure: Secondary | ICD-10-CM | POA: Insufficient documentation

## 2021-03-13 DIAGNOSIS — I340 Nonrheumatic mitral (valve) insufficiency: Secondary | ICD-10-CM

## 2021-03-13 DIAGNOSIS — Z91011 Allergy to milk products: Secondary | ICD-10-CM | POA: Insufficient documentation

## 2021-03-13 DIAGNOSIS — Z885 Allergy status to narcotic agent status: Secondary | ICD-10-CM | POA: Insufficient documentation

## 2021-03-13 DIAGNOSIS — E119 Type 2 diabetes mellitus without complications: Secondary | ICD-10-CM | POA: Insufficient documentation

## 2021-03-13 DIAGNOSIS — I313 Pericardial effusion (noninflammatory): Secondary | ICD-10-CM

## 2021-03-13 DIAGNOSIS — I110 Hypertensive heart disease with heart failure: Secondary | ICD-10-CM | POA: Insufficient documentation

## 2021-03-13 DIAGNOSIS — R079 Chest pain, unspecified: Secondary | ICD-10-CM | POA: Insufficient documentation

## 2021-03-13 DIAGNOSIS — Z7982 Long term (current) use of aspirin: Secondary | ICD-10-CM | POA: Insufficient documentation

## 2021-03-13 DIAGNOSIS — Z7984 Long term (current) use of oral hypoglycemic drugs: Secondary | ICD-10-CM | POA: Insufficient documentation

## 2021-03-13 DIAGNOSIS — I252 Old myocardial infarction: Secondary | ICD-10-CM | POA: Insufficient documentation

## 2021-03-13 DIAGNOSIS — Z79899 Other long term (current) drug therapy: Secondary | ICD-10-CM | POA: Insufficient documentation

## 2021-03-13 HISTORY — PX: TEE WITHOUT CARDIOVERSION: SHX5443

## 2021-03-13 LAB — GLUCOSE, CAPILLARY: Glucose-Capillary: 81 mg/dL (ref 70–99)

## 2021-03-13 SURGERY — ECHOCARDIOGRAM, TRANSESOPHAGEAL
Anesthesia: Monitor Anesthesia Care

## 2021-03-13 MED ORDER — LIDOCAINE 2% (20 MG/ML) 5 ML SYRINGE
INTRAMUSCULAR | Status: DC | PRN
  Administered 2021-03-13: 80 mg via INTRAVENOUS

## 2021-03-13 MED ORDER — SODIUM CHLORIDE 0.9 % IV SOLN: INTRAVENOUS | Status: DC

## 2021-03-13 MED ORDER — PROPOFOL 10 MG/ML IV BOLUS: INTRAVENOUS | Status: DC | PRN

## 2021-03-13 MED ORDER — DEXMEDETOMIDINE (PRECEDEX) IN NS 20 MCG/5ML (4 MCG/ML) IV SYRINGE
PREFILLED_SYRINGE | INTRAVENOUS | Status: DC | PRN
  Administered 2021-03-13: 12 ug via INTRAVENOUS

## 2021-03-13 MED ORDER — PHENYLEPHRINE 40 MCG/ML (10ML) SYRINGE FOR IV PUSH (FOR BLOOD PRESSURE SUPPORT): PREFILLED_SYRINGE | INTRAVENOUS | Status: DC | PRN

## 2021-03-13 MED ORDER — PROPOFOL 500 MG/50ML IV EMUL: INTRAVENOUS | Status: DC | PRN

## 2021-03-13 MED ORDER — BUTAMBEN-TETRACAINE-BENZOCAINE 2-2-14 % EX AERO
INHALATION_SPRAY | CUTANEOUS | Status: DC | PRN
  Administered 2021-03-13: 2 via TOPICAL

## 2021-03-13 MED ORDER — LIDOCAINE 2% (20 MG/ML) 5 ML SYRINGE: INTRAMUSCULAR | Status: DC | PRN

## 2021-03-13 MED ORDER — PROPOFOL 10 MG/ML IV BOLUS
INTRAVENOUS | Status: DC | PRN
  Administered 2021-03-13: 30 mg via INTRAVENOUS

## 2021-03-13 MED ORDER — PROPOFOL 500 MG/50ML IV EMUL
INTRAVENOUS | Status: DC | PRN
  Administered 2021-03-13: 150 ug/kg/min via INTRAVENOUS

## 2021-03-13 NOTE — Progress Notes (Signed)
  Echocardiogram Echocardiogram Transesophageal has been performed.  Fidel Levy 03/13/2021, 3:58 PM

## 2021-03-13 NOTE — Interval H&P Note (Signed)
History and Physical Interval Note:  03/13/2021 2:12 PM  Marie Tanner  has presented today for surgery, with the diagnosis of [place].  The various methods of treatment have been discussed with the patient and family. After consideration of risks, benefits and other options for treatment, the patient has consented to  Procedure(s): TRANSESOPHAGEAL ECHOCARDIOGRAM (TEE) (N/A) as a surgical intervention.  The patient's history has been reviewed, patient examined, no change in status, stable for surgery.  I have reviewed the patient's chart and labs.  Questions were answered to the patient's satisfaction.     [name]

## 2021-03-13 NOTE — Anesthesia Preprocedure Evaluation (Signed)
Anesthesia Evaluation  Patient identified by MRN, date of birth, ID band Patient awake    Reviewed: Allergy & Precautions, NPO status , Patient's Chart, lab work & pertinent test results  History of Anesthesia Complications Negative for: history of anesthetic complications  Airway Mallampati: III  TM Distance: >3 FB Neck ROM: Full    Dental  (+) Dental Advisory Given, Teeth Intact   Pulmonary shortness of breath, sleep apnea , neg COPD,    breath sounds clear to auscultation       Cardiovascular hypertension, Pt. on medications + angina + CAD, + Past MI, + Cardiac Stents and +CHF  + Valvular Problems/Murmurs MR  Rhythm:Regular + Systolic murmurs 1. Consider TEE to further evaluate mitral regugitation, very eccentric  jet which may be more severe than depicted on color Doppler.  2. Left ventricular ejection fraction, by estimation, is 35 to 40%. The  left ventricle has moderately decreased function. The left ventricle  demonstrates global hypokinesis. There is mild left ventricular  hypertrophy. Left ventricular diastolic  parameters are consistent with Grade I diastolic dysfunction (impaired  relaxation).  3. Right ventricular systolic function is normal. The right ventricular  size is normal.  4. The mitral valve is normal in structure. Moderate to severe mitral  valve regurgitation. No evidence of mitral stenosis.  5. The aortic valve is tricuspid. There is mild calcification of the  aortic valve. There is mild thickening of the aortic valve. Aortic valve  regurgitation is not visualized. No aortic stenosis is present.  6. The inferior vena cava is normal in size with greater than 50%  respiratory variability, suggesting right atrial pressure of 3 mmHg.   2022 stress:   The left ventricular ejection fraction is moderately decreased (30-44%).  Nuclear stress EF: 39%.  Blood pressure demonstrated a hypertensive  response to exercise.  Downsloping ST segment depression of 0.5 mm was noted in the II and aVF leads (ST deviation beginning in recovery).  Defect 1: There is a medium defect of moderate severity present in the apical anterior and apex location.  Defect 2: There is a medium defect of severe severity present in the mid inferior and apical inferior location.  Findings consistent with prior myocardial infarction with peri-infarct ischemia.  This is an intermediate risk study.   Intermediate risk stress nuclear study with evidence of previous infarction in the distal LAD artery and posterior descending artery distribution, with very limited peri-infarct ischemia.  Moderately reduced left ventricular global systolic function. No significant change since 2018.    Mid LM to Ost LAD DES Stent is 10% stenosed. -This entire segment is heavily calcified. There is a significant step down after the stent at the takeoff of the diagonal branches.  Prox LAD lesion is 20% stenosed.  Ost Ramus lesion is 40% stenosed. Proc Circumflex ~40%.  There is moderate left ventricular systolic dysfunction. The left ventricular ejection fraction is 35-45% by visual estimate.  LV end diastolic pressure is moderately elevated. 22-24 mmHg   There does not appear to be an obvious culprit lesion to explain inferior ischemia.  She has a large wraparound LAD and is quite likely with LAD infarct she had inferoapical hypokinesis.  There is moderate disease in the LAD, but not flow-limiting.  Visually, her EF does appear to be slightly higher than what was read on the nuclear stress test.    Neuro/Psych PSYCHIATRIC DISORDERS Depression negative neurological ROS     GI/Hepatic Neg liver ROS, GERD  ,  Endo/Other  diabetes, Type 2Lab Results      Component                Value               Date                      HGBA1C                   7.0 (A)             01/22/2020             Renal/GU Renal diseaseLab  Results      Component                Value               Date                      CREATININE               0.71                03/05/2021           Lab Results      Component                Value               Date                      K                        4.9                 03/05/2021                Musculoskeletal negative musculoskeletal ROS (+)   Abdominal   Peds  Hematology Lab Results      Component                Value               Date                      WBC                      3.1 (L)             03/05/2021                HGB                      12.7                03/05/2021                HCT                      38.8                03/05/2021                MCV                      83  03/05/2021                PLT                      285                 03/05/2021            brilinta/asa   Anesthesia Other Findings   Reproductive/Obstetrics                             Anesthesia Physical Anesthesia Plan  ASA: III  Anesthesia Plan: MAC   Post-op Pain Management:    Induction: Intravenous  PONV Risk Score and Plan: 2 and Propofol infusion and Treatment may vary due to age or medical condition  Airway Management Planned: Nasal Cannula  Additional Equipment: None  Intra-op Plan:   Post-operative Plan:   Informed Consent: I have reviewed the patients History and Physical, chart, labs and discussed the procedure including the risks, benefits and alternatives for the proposed anesthesia with the patient or authorized representative who has indicated his/her understanding and acceptance.     Dental advisory given  Plan Discussed with: CRNA  Anesthesia Plan Comments:         Anesthesia Quick Evaluation

## 2021-03-13 NOTE — Discharge Instructions (Signed)

## 2021-03-13 NOTE — Transfer of Care (Signed)
Immediate Anesthesia Transfer of Care Note  Patient: Marie Tanner  Procedure(s) Performed: TRANSESOPHAGEAL ECHOCARDIOGRAM (TEE) (N/A )  Patient Location: Endoscopy Unit  Anesthesia Type:MAC  Level of Consciousness: awake and alert   Airway & Oxygen Therapy: Patient Spontanous Breathing and Patient connected to nasal cannula oxygen  Post-op Assessment: Report given to RN and Post -op Vital signs reviewed and stable  Post vital signs: Reviewed and stable  Last Vitals:  Vitals Value Taken Time  BP 131/62 03/13/21 1548  Temp    Pulse 82 03/13/21 1549  Resp 21 03/13/21 1549  SpO2 97 % 03/13/21 1549  Vitals shown include unvalidated device data.  Last Pain:  Vitals:   03/13/21 1055  TempSrc: Oral  PainSc: 0-No pain         Complications: No complications documented.

## 2021-03-13 NOTE — Anesthesia Procedure Notes (Signed)
Procedure Name: MAC Performed by: Ruthel Martine B, CRNA Pre-anesthesia Checklist: Patient identified, Emergency Drugs available, Suction available, Patient being monitored and Timeout performed Patient Re-evaluated:Patient Re-evaluated prior to induction Oxygen Delivery Method: Nasal cannula Preoxygenation: Pre-oxygenation with 100% oxygen Induction Type: IV induction Airway Equipment and Method: Bite block Placement Confirmation: positive ETCO2 Dental Injury: Teeth and Oropharynx as per pre-operative assessment        

## 2021-03-13 NOTE — CV Procedure (Signed)
INDICATIONS: Mitral valve regurgitation  PROCEDURE:   Informed consent was obtained prior to the procedure. The risks, benefits and alternatives for the procedure were discussed and the patient comprehended these risks.  Risks include, but are not limited to, cough, sore throat, vomiting, nausea, somnolence, esophageal and stomach trauma or perforation, bleeding, low blood pressure, aspiration, pneumonia, infection, trauma to the teeth and death.    After a procedural time-out, the oropharynx was anesthetized with 20% benzocaine spray.   During this procedure the patient was administered propofol per anesthesia.  The patient's heart rate, blood pressure, and oxygen saturation were monitored continuously during the procedure. The period of conscious sedation was 45 minutes, of which I was present face-to-face 100% of this time.  The transesophageal probe was inserted in the esophagus and stomach without difficulty and multiple views were obtained.  The patient was kept under observation until the patient left the procedure room.  The patient left the procedure room in stable condition.   Agitated microbubble saline contrast was administered.  COMPLICATIONS:    There were no immediate complications.  FINDINGS:   FORMAL ECHOCARDIOGRAM REPORT PENDING  Moderate-severe mitral valve regurgitation with small flail segment at P3 MV scallop. No pulmonary vein systolic reversals. Very eccentric anteriorly directed jet.   Remainder per echo reports, formal quantitation to follow.  RECOMMENDATIONS:    Consider surgical consultation for small flail P3 segment in setting of LV dysfunction. She had very high blood pressure readings in the pre-procedural bay, MR may appear worse under those conditions.  Time Spent Directly with the Patient:  60 minutes   [name] 03/13/2021, 3:53 PM

## 2021-03-14 ENCOUNTER — Encounter (HOSPITAL_COMMUNITY): Payer: Self-pay | Admitting: Internal Medicine

## 2021-03-15 LAB — ECHO TEE
MV M vel: 5.5 m/s
MV Peak grad: 121 mmHg
Radius: 0.9 cm

## 2021-03-24 NOTE — Anesthesia Postprocedure Evaluation (Signed)
Anesthesia Post Note  Patient: Marie Tanner  Procedure(s) Performed: TRANSESOPHAGEAL ECHOCARDIOGRAM (TEE) (N/A )     Patient location during evaluation: PACU Anesthesia Type: MAC Level of consciousness: awake and alert Pain management: pain level controlled Vital Signs Assessment: post-procedure vital signs reviewed and stable Respiratory status: spontaneous breathing, nonlabored ventilation, respiratory function stable and patient connected to nasal cannula oxygen Cardiovascular status: stable and blood pressure returned to baseline Postop Assessment: no apparent nausea or vomiting Anesthetic complications: no   No complications documented.  Last Vitals:  Vitals:   03/13/21 1559 03/13/21 1616  BP: 128/61 (!) 184/81  Pulse: 75 80  Resp: 16 15  Temp:    SpO2: 100% 100%    Last Pain:  Vitals:   03/14/21 1345  TempSrc:   PainSc: 0-No pain                 Sovereign Ramiro S

## 2021-03-27 ENCOUNTER — Telehealth: Payer: Self-pay | Admitting: *Deleted

## 2021-03-27 DIAGNOSIS — I340 Nonrheumatic mitral (valve) insufficiency: Secondary | ICD-10-CM

## 2021-03-27 NOTE — Telephone Encounter (Signed)
Tried to call, mailbox full

## 2021-03-27 NOTE — Telephone Encounter (Signed)
-----   Message from Skeet Latch, MD sent at 03/15/2021  7:16 PM EDT ----- Can we please have her see Dr. Roxy Manns for consideration of mitral valve repair?  ----- Message ----- From: [name], MD Sent: 03/15/2021   4:23 PM EDT To: Skeet Latch, MD  Tiffany,   There is a small flail segment at P3. Surprisingly, the MR appeared moderate-severe and no PV reversals, but BP was down quite a bit after anesthesia lowered it for the procedure and with sedation. I think with her normal hypertensive Bps it's likely severe with that anatomic defect.   Thanks, Smitty Cords

## 2021-04-01 NOTE — Telephone Encounter (Signed)
Spoke with patient and referral placed  Will keep f/u Thursday as scheduled

## 2021-04-03 ENCOUNTER — Telehealth: Payer: Self-pay | Admitting: *Deleted

## 2021-04-03 ENCOUNTER — Encounter: Payer: Self-pay | Admitting: Cardiovascular Disease

## 2021-04-03 ENCOUNTER — Ambulatory Visit (INDEPENDENT_AMBULATORY_CARE_PROVIDER_SITE_OTHER): Payer: Self-pay | Admitting: Cardiovascular Disease

## 2021-04-03 ENCOUNTER — Other Ambulatory Visit: Payer: Self-pay

## 2021-04-03 VITALS — BP 138/70 | HR 71 | Ht 62.0 in | Wt 189.0 lb

## 2021-04-03 DIAGNOSIS — Z9861 Coronary angioplasty status: Secondary | ICD-10-CM

## 2021-04-03 DIAGNOSIS — I10 Essential (primary) hypertension: Secondary | ICD-10-CM

## 2021-04-03 DIAGNOSIS — G4733 Obstructive sleep apnea (adult) (pediatric): Secondary | ICD-10-CM

## 2021-04-03 DIAGNOSIS — R300 Dysuria: Secondary | ICD-10-CM

## 2021-04-03 DIAGNOSIS — I5042 Chronic combined systolic (congestive) and diastolic (congestive) heart failure: Secondary | ICD-10-CM

## 2021-04-03 DIAGNOSIS — I340 Nonrheumatic mitral (valve) insufficiency: Secondary | ICD-10-CM

## 2021-04-03 DIAGNOSIS — R0602 Shortness of breath: Secondary | ICD-10-CM

## 2021-04-03 DIAGNOSIS — Z01812 Encounter for preprocedural laboratory examination: Secondary | ICD-10-CM

## 2021-04-03 DIAGNOSIS — Z5181 Encounter for therapeutic drug level monitoring: Secondary | ICD-10-CM

## 2021-04-03 DIAGNOSIS — I2510 Atherosclerotic heart disease of native coronary artery without angina pectoris: Secondary | ICD-10-CM

## 2021-04-03 DIAGNOSIS — M25551 Pain in right hip: Secondary | ICD-10-CM

## 2021-04-03 HISTORY — DX: Pain in right hip: M25.551

## 2021-04-03 MED ORDER — FLUCONAZOLE 150 MG PO TABS: 150.0000 mg | ORAL_TABLET | Freq: Once | ORAL | 0 refills | Status: AC

## 2021-04-03 MED ORDER — ENTRESTO 49-51 MG PO TABS: 1.0000 | ORAL_TABLET | Freq: Two times a day (BID) | ORAL | 11 refills | Status: DC

## 2021-04-03 NOTE — Progress Notes (Signed)
Cardiology Office Note   Date:  04/03/2021   ID:  Marie Tanner, DOB [birth year], MRN [id number]  PCP:  Marie Moron, MD  Cardiologist:   Marie Latch, MD  Oncologist: Marie Bouche, MD Radiation Oncologist: Marie Horns, MD Surgical Oncologist: Marie Neas, MD  No chief complaint on file.   History of Present Illness: Marie Tanner is a 65 y.o. female with CAD s/p STEMI, hypertension, chronic systolic and diastolic heart failure, OSA not on CPAP, diabetes, prior DVT, breast cancer, and mitral regurgitation who presents for follow up.  In 2015 she had a STEMI and cardiac arrest. She was cared for at [facility]. She ultimately received 2 DES (LM/LAD and LCx).  She had a prolonged hospitalization requiring tracheostomy.  Her liver was lacerated during CPR.  She required surgical repair after femoral catheterization.  She also developed a DVT in the hospital.  One week after discharge she developed pneumonia and was admitted at [facility].  She has been seen in the hospital twice for chest pain and ruled out for MI.  She had a negative Myoview 05/2015.  She also had negative CT scans for PE.  Marie Tanner also underwent surgery for breast cancer on 02/21/16.  She completed her radiation in September 2017.  She was referred for sleep study which was abnormal.  However, she declined using the CPAP device and is in the process of being fitted for an oral airway.  Marie Tanner had an echo 06/2016 that showed LVEF 50-55% with grade 1 diastolic dysfunction.    Marie Tanner reported an episode of chest pain that occurred while driving.  She was referred for [facility] 11/24/17 that revealed LVEF 36% with anterior, anteroseptal, apical inferior and apical lateral defects.  There was concern for infarct with peri-infarct ischemia in the inferior and apical walls.  She underwent LHC 11/2017 that revealed patent stents that were heavily calcified and there was a step down after the takeoff of  the diagonal.  She had 40% RI, 40% proximal LCx and 20% prox LAD.  LVEF was 35-45% and LVEDP was elevated.  BiDil was added to her regimen for afterload reduction and poorly controlled hypertension.  She reported increased dyspnea on exertion and edema.  Lasix was increased for 2 days.  She was also referred for an echocardiogram 06/2018 that revealed LVEF 45 to 50% with hypokinesis of the mid to apical inferior and inferoseptal myocardium that was relatively unchanged from prior.  She was also started on spironolactone.  She had atypical chest pain and was referred for an ETT 10/2018 that was negative.  She achieved 6.2 METS on a Bruce protocol.    Marie Tanner reported episodes of dizziness.  Losartan was reduced to 12.5 mg she also had persistent episodes of chest discomfort that were atypical and occurred when lying down.  Thought to be secondary to GERD and her PPI was restarted.  At her last appointment Marie Tanner was struggling due to cost of medications.  She has been out of her BiDil, ticagrelor, and Praluent.  BiDil was switched to hydralazine and Imdur.  Her BP has been improving.  It has been as high as 170-180s She was started on patient assistance for ticagrelor and Praluent.    Marie Tanner BP was above goal, so hydralazine was increased. She called the office 01/19/2021 with chest pain and shortness of breath. She was referred for a nuclear stress test that revealed LVEF 39% with concern for infarct with peri-infarct ischemia.  She had a heart monitor 2/22 with 10 beats of NSVT. She also had an echo 03/19/2021 with LVEF 34-40% and moderate to severe mitral regurgitation.  She had an echo 02/2021 that revealed LVEF 35 to 40% and very eccentric mitral regurgitation.  Her left atrium was normal in size.  She was referred for TEE that revealed LVEF 45 to 50% with a very eccentric, anteriorly directed mitral regurgitant jet.  It was thought to be due to a small flail P3 scallop.  There is no  evidence of pulmonary flow reversal.  Based on pes of the ER OSA was 0.17 cm with a regurgitant volume of 30 mL.  However this was thought to be underestimated in the setting of anesthesia and an eccentric jet.  Overall it was thought to be moderate to severe.  At her last appointment she was started on both [place].  She has noted an improvement in her breathing.  However she has had vaginal discomfort and irritation.  She denies dysuria or hematuria.  She has not had any lower extremity edema, orthopnea, or PND.  She complains of pain in her right hip that limits her ability to exercise but she does try to walk regularly.  She has not needed to take any Lasix since starting the [facility].  She has not been checking her blood pressure at home regularly.  Past Medical History:  Diagnosis Date  . Breast cancer ([place])   . Cancer ([place])   . Chronic combined systolic and diastolic heart failure ([place]) 01/30/2015  . Coronary artery disease   . Depression   . DVT (deep venous thrombosis) ([place]) 11/15   left femerol artery injury during resusciation.  [name] GERD (gastroesophageal reflux disease)   . HCAP (healthcare-associated pneumonia) 11/22/2014  . Heart murmur   . High cholesterol   . History of blood transfusion 09/2014   related to MI  . History of gout   . Hypertension   . Liver laceration 10/19/14  . Palpitations 12/12/2020  . Pulmonary edema 10/19/14  . Renal disorder   . Right hip pain 04/03/2021  . Sleep apnea    "suppose to wear mask but I don't" (11/22/2014)  . STEMI (ST elevation myocardial infarction) ([place]) 10/19/14  . Type II diabetes mellitus ([place])   . Urticaria     Past Surgical History:  Procedure Laterality Date  . ABDOMINAL HYSTERECTOMY  ~ 2000  . APPLICATION OF WOUND VAC  10/2014   "over my naval" (11/22/2014)  . BREAST LUMPECTOMY WITH AXILLARY LYMPH NODE BIOPSY    . BREAST SURGERY    . [place]; 1982  . CORONARY ANGIOPLASTY WITH STENT PLACEMENT   09/2014   "multiple"/notes 11/22/2014  . EXTRACORPOREAL CIRCULATION    . Femerol artery repair Right 09/2014   pt insists it was a right femoral artery repair on 11/22/2014  . LAPAROSCOPIC ABDOMINAL EXPLORATION    . LEFT HEART CATH AND CORONARY ANGIOGRAPHY N/A 12/07/2017   Procedure: LEFT HEART CATH AND CORONARY ANGIOGRAPHY;  Surgeon: Leonie Man, MD;  Location: [place] [facility];  Service: Cardiovascular;  Laterality: N/A;  . LEFT VENTRICULAR ASSIST DEVICE     pt is not aware of this hx on 11/22/2014  . Liver laceration repair  09/2014   related to CPR/notes 11/22/2014  . TEE WITHOUT CARDIOVERSION N/A 03/13/2021   Procedure: TRANSESOPHAGEAL ECHOCARDIOGRAM (TEE);  Surgeon: [name], MD;  Location: [place];  Service: Cardiology;  Laterality: N/A;  . TRACHEOSTOMY  09/2014   "closed on it's own when they took it out"  . TUBAL LIGATION  1982     Current Outpatient Medications  Medication Sig Dispense Refill  . acetaminophen (TYLENOL) 500 MG tablet Take 1,000 mg by mouth every 6 (six) hours as needed for moderate pain.    . Alirocumab (PRALUENT) 150 MG/ML SOAJ Inject 150 mg into the skin every 14 (fourteen) days. 2 mL 11  . aspirin EC 81 MG tablet Take 1 tablet (81 mg total) by mouth daily.    . blood glucose meter kit and supplies KIT by Does not apply route daily as needed. Dispense based on patient and insurance preference. Use up to four times daily as directed. (FOR ICD-9 250.00, 250.01).    . BRILINTA 60 MG TABS tablet TAKE 1 TABLET (60 MG TOTAL) BY MOUTH 2 (TWO) TIMES DAILY. 180 tablet 2  . cholecalciferol (VITAMIN D3) 25 MCG (1000 UNIT) tablet Take 1,000 Units by mouth daily.    [name] exemestane (AROMASIN) 25 MG tablet Take 25 mg by mouth daily after breakfast.     . fluconazole (DIFLUCAN) 150 MG tablet Take 1 tablet (150 mg total) by mouth once for 1 dose. 1 tablet 0  . fluticasone (FLONASE) 50 MCG/ACT nasal spray 2 sprays per nostril at night for stuffy nose at 7pm  18.2 mL 5  . furosemide (LASIX) 40 MG tablet TAKE 1 TABLET AS NEEDED FOR SHORTNESS OF BREATH OR WEIGHT GAIN OF 2 POUNDS IN 24 HOURS OR 5 POUNDS IN 1 WEEK (Patient taking differently: Take 40 mg by mouth daily as needed (SHORTNESS OF BREATH OR WEIGHT GAIN OF 2 POUNDS IN 24 HOURS OR 5 POUNDS IN 1 WEEK).) 30 tablet 3  . glucose blood test strip Use as instructed. E11.5. To check blood glucose daily. 100 each 12  . magnesium oxide (MAG-OX) 400 MG tablet TAKE 1 TABLET BY MOUTH EVERY DAY 90 tablet 0  . metFORMIN (GLUCOPHAGE-XR) 500 MG 24 hr tablet TAKE 1 TABLET BY MOUTH EVERY DAY WITH BREAKFAST 90 tablet 1  . metoprolol succinate (TOPROL-XL) 25 MG 24 hr tablet Take 1 tablet (25 mg total) by mouth daily. 90 tablet 3  . nitroGLYCERIN (NITROSTAT) 0.4 MG SL tablet Place 1 tablet (0.4 mg total) under the tongue every 5 (five) minutes as needed for chest pain. max of 3 if no relief call 911 25 tablet PRN  . rosuvastatin (CRESTOR) 20 MG tablet Take 1 tablet by mouth 3 times per week (Patient taking differently: Take 10 mg by mouth every Monday, Wednesday, and Friday.) 30 tablet 6  . sacubitril-valsartan (ENTRESTO) 49-51 MG Take 1 tablet by mouth 2 (two) times daily. 60 tablet 11  . spironolactone (ALDACTONE) 25 MG tablet Take 1 tablet (25 mg total) by mouth daily. 90 tablet 2  . triamcinolone cream (KENALOG) 0.1 % Apply 1 application topically 2 (two) times daily. (Patient taking differently: Apply 1 application topically 2 (two) times daily as needed (rash).) 80 g 3  . isosorbide mononitrate (IMDUR) 30 MG 24 hr tablet Take 1 tablet (30 mg total) by mouth daily. 90 tablet 3   No current facility-administered medications for this visit.    Allergies:   Dilaudid [hydromorphone hcl], Epinephrine, and Lactose intolerance (gi)    Social History:  The patient  reports that she has never smoked. She has never used smokeless tobacco. She reports current alcohol use. She reports that she does not use drugs.   Family  History:  The patient's family  history includes Alzheimer's disease in her father; Asthma in her daughter; COPD in her mother; Diabetes in her brother and mother; Heart attack in her father; Hypertension in her mother.    ROS:  Please see the history of present illness.   Otherwise, review of systems are positive for left chest pain, shortness of breath, LE edema, and nausea.   All other systems are reviewed and negative.    PHYSICAL EXAM: VS:  BP 138/70   Pulse 71   Ht _0  (1.575 m)   Wt 189 lb (85.7 kg)   SpO2 99%   BMI 34.57 kg/m  , BMI Body mass index is 34.57 kg/m. GENERAL:  Well appearing HEENT: Pupils equal round and reactive, fundi not visualized, oral mucosa unremarkable NECK:  No jugular venous distention, waveform within normal limits, carotid upstroke brisk and symmetric, no bruits LUNGS:  Clear to auscultation bilaterally HEART:  RRR.  PMI not displaced or sustained, S1 and S2 within normal limits, no S3, no S4, no clicks, no rubs, III/VI systolic murmur anteriorly ABD:  Flat, positive bowel sounds normal in frequency in pitch, no bruits, no rebound, no guarding, no midline pulsatile mass, no hepatomegaly, no splenomegaly EXT:  2 plus pulses throughout, no edema, no cyanosis no clubbing SKIN:  No rashes no nodules NEURO:  Cranial nerves II through XII grossly intact, motor grossly intact throughout PSYCH:  Cognitively intact, oriented to person place and time   EKG:  03/06/2021: EKG was not ordered today. 02/11/16: Sinus arrhythmia.  Rate 71 bpm. Non-specific ST-T changes. 10/05/16: Sinus rhythm. Rate 85 bpm. Prior inferior infarct. 11/16/17: Sinus rhythm.  Rate 87 bpm.  Prior inferior infarct.  07/22/18: Sinu srhythm.  Rate 90 bpm.  Prior inferior infarct.   02/08/2019: Sinus rhythm.  Rate 80 bpm.  Prior inferior infarct. 03/12/2020: Sinus rhythm.  Rate 81 bpm.  Prior inferior infarct.  07/26/18: Study Conclusions  - Left ventricle: The cavity size was normal. Systolic  function was   mildly reduced. The estimated ejection fraction was in the range   of 45% to 50%. Hypokinesis of the mid-apicalinferior and   inferoseptal myocardium. Doppler parameters are consistent with   abnormal left ventricular relaxation (grade 1 diastolic   dysfunction). Acoustic contrast opacification revealed no   evidence ofthrombus. - Mitral valve: There was moderate regurgitation directed   eccentrically and anteriorly. - Left atrium: The atrium was mildly dilated. - Atrial septum: No defect or patent foramen ovale was identified.  Impressions:  - Direct comparison to images from August 2917 shows no change in   wall motion pattern or overall LV EF. [name]  Lexiscan Myoview 11/24/17:  The left ventricular ejection fraction is moderately decreased (30-44%).  Nuclear stress EF: 36%.  There was no ST segment deviation noted during stress.  There is a large defect of severe severity present in the mid anterior, mid inferior, apical anterior, apical septal, apical inferior, apical lateral and apex location. The defect is partially reversible. This is consistent with infarct with small amount of peri infarct ischemia in the inferior and apical walls.  This is a high risk study.  Compared to prior study the EF has declined and there appears to be peri infarct ischemia in the inferior wall although this could also be variations in diaphragmatic attenuation artifact.   LHC 12/07/17:  Mid LM to Ost LAD DES Stent is 10% stenosed. -This entire segment is heavily calcified. There is a significant step down after the stent at the takeoff  of the diagonal branches.  Prox LAD lesion is 20% stenosed.  Ost Ramus lesion is 40% stenosed. Proc Circumflex ~40%.  There is moderate left ventricular systolic dysfunction. The left ventricular ejection fraction is 35-45% by visual estimate.  LV end diastolic pressure is moderately elevated. 22-24 mmHg  ETT 10/2018: No ischemia.  6.2 METS on  Bruce protocol.[name]  ECHO 03/04/2021: 1. Consider TEE to further evaluate mitral regugitation, very eccentric  jet which may be more severe than depicted on color Doppler.  2. Left ventricular ejection fraction, by estimation, is 35 to 40%. The  left ventricle has moderately decreased function. The left ventricle  demonstrates global hypokinesis. There is mild left ventricular  hypertrophy. Left ventricular diastolic  parameters are consistent with Grade I diastolic dysfunction (impaired  relaxation).  3. Right ventricular systolic function is normal. The right ventricular  size is normal.  4. The mitral valve is normal in structure. Moderate to severe mitral  valve regurgitation. No evidence of mitral stenosis.  5. The aortic valve is tricuspid. There is mild calcification of the  aortic valve. There is mild thickening of the aortic valve. Aortic valve  regurgitation is not visualized. No aortic stenosis is present.  6. The inferior vena cava is normal in size with greater than 50%  respiratory variability, suggesting right atrial pressure of 3 mmHg.  Non-telemetry monitor study 01/22/2021: 30 Day Event Monitor  Quality: Fair.  Baseline artifact. Predominant rhythm: sinus rhythm                         Average heart rate: 81 bpm Max heart rate: 157 bpm Min heart rate: 47 bpm  10 beats NSVT Patient did submit a symptom diary.  She reported shortness of breath and heart fluttering at which time sinus rhythm was noted.    01/17/2021 Stress Test : Study Highlights    The left ventricular ejection fraction is moderately decreased (30-44%).  Nuclear stress EF: 39%.  Blood pressure demonstrated a hypertensive response to exercise.  Downsloping ST segment depression of 0.5 mm was noted in the II and aVF leads (ST deviation beginning in recovery).  Defect 1: There is a medium defect of moderate severity present in the apical anterior and apex location.  Defect 2: There is  a medium defect of severe severity present in the mid inferior and apical inferior location.  Findings consistent with prior myocardial infarction with peri-infarct ischemia.  This is an intermediate risk study.   Intermediate risk stress nuclear study with evidence of previous infarction in the distal LAD artery and posterior descending artery distribution, with very limited peri-infarct ischemia.  Moderately reduced left ventricular global systolic function. No significant change since 2018.  TEE 03/13/21: 1. Very eccentric, anteriorly directed jet of mitral valve regurgitation  with small flail segment at P3 scallop. No systolic reversals in pulmonary  veins. Mitral valve regurgitation appears moderate-severe. PISA  quantitation with angle correction  results in ERO 0.17 cm2 with regurgitant volume 30 mL. Patient's systolic  blood pressure was approximately 90 mmHg at time of MV regurgitation  evaluation, suspect degree of regurgitation may appear worse at ambulatory  blood pressures given flail segment.  The mitral valve is abnormal. Moderate to severe mitral valve  regurgitation. No evidence of mitral stenosis.  2. Left ventricular ejection fraction, by estimation, is 45 to 50%. The  left ventricle has mildly decreased function.  3. Right ventricular systolic function is normal. The right ventricular  size  is normal.  4. Left atrial size was moderately dilated. No left atrial/left atrial  appendage thrombus was detected. The LAA emptying velocity was 68 cm/s.  5. Right atrial size was moderately dilated.  6. A small pericardial effusion is present.  7. The aortic valve is normal in structure. Aortic valve regurgitation is  not visualized. No aortic stenosis is present.  8. There is Moderate (Grade III) atheroma plaque involving the transverse  and descending aorta.  9. Agitated saline contrast bubble study was positive with shunting  observed after >6 cardiac cycles  suggestive of intrapulmonary shunting.   Recent Labs: 03/05/2021: ALT 13; BUN 13; Creatinine, Ser 0.71; Hemoglobin 12.7; Magnesium 1.8; Platelets 285; Potassium 4.9; Sodium 135; TSH 2.990    Lipid Panel    Component Value Date/Time   CHOL 166 03/05/2021 1132   TRIG 69 03/05/2021 1132   HDL 56 03/05/2021 1132   CHOLHDL 3.0 03/05/2021 1132   CHOLHDL 3.1 10/05/2016 0937   VLDL 15 10/05/2016 0937   LDLCALC 97 03/05/2021 1132      Wt Readings from Last 3 Encounters:  04/03/21 189 lb (85.7 kg)  03/13/21 189 lb (85.7 kg)  03/06/21 186 lb 6.4 oz (84.6 kg)      ASSESSMENT AND PLAN: No problem-specific Assessment & Plan notes found for this encounter.  # CAD s/p STEMI: # Chronic angina: # Hyperlipidemia:  # NSVT: Currently denies angina.  Most recent nuclear stress 12/2020 with LVEF 39% with prior infarct in the distal LAD and PDA with very limited peri-infarct ischemia.  Ms. Flammer had an ST elevation myocardial infarction with stents placed in the LAD/left main and left circumflex arteries.  She is on low dose ticagrelor indefinitely.  LHC 11/2017 showed patent stents with 20-40% lesions.  No more chest pain.  Continue medical management with aspirin, Praluent, metoprolol, and Imdur.  # Chronic systolic and diastolic heart failure: LVEF 45-50%.    She is not tolerating Jardiance due to vaginal irritation.  We will discontinue it.  We will check a UA and give her a dose of fluconazole.  Blood pressure will allow for [place] to titrate her Entresto.  Increase the dose to 49/51 mg twice daily.  Continue metoprolol, spironolactone, and Lasix as needed.  Check a basic metabolic panel in a week.    # Moderate mitral regurgitation: TEE and TTE personally reviewed.  We will have her see the structural heart team regarding her mitral regurgitation.  It is a very eccentric jet that is anteriorly directed.  Typically due to the Coanda effect this represents severe MR, though her numbers by [facility] during  the exam were more consistent with mild regurgitation.  She does have a flail P3 segment.  Unclear if she would be a candidate for a MitraClip.  She is hesitant about having surgery but would do it if necessary.  # Hypertensive heart disease: BP is above goal.    Increase Entresto as above.  # OSA: Oral airway device.   # Shortness of breath:  # Moderate-severe MR:  # Extracardiac shunt:  On TEE there was a late positive bubble study.  This is concerning for an intrapulmonary shunt.  Her last chest imaging was in 2020 and there was no evidence of this.  She has no polycythemia and no right heart dysfunction or pulm elevated pulmonary pressures.  We will repeat a chest x-ray to make sure there is no sign of abnormality.  #Right hip pain: Check hip x-rays.  She will  follow-up with Dr. Nolon Rod about this.  Current medicines are reviewed at length with the patient today.  The patient does not have concerns regarding medicines.  The following changes have been made: none  Labs/ tests ordered today include:   Orders Placed This Encounter  Procedures  . DG Chest 2 View  . DG HIP UNILAT WITH PELVIS 2-3 VIEWS RIGHT  . Urinalysis  . Basic metabolic panel  . Ambulatory referral to Structural Heart/Valve Clinic (only at [place])    Disposition:   FU with Lebaron Bautch C. Oval Linsey, MD, [facility] in 2 months.    Signed, Kardell Virgil C. Oval Linsey, MD, [facility]  04/03/2021 11:37 AM    [place]

## 2021-04-03 NOTE — Telephone Encounter (Signed)
Patient was seen in office today and was suggested that she see structural heart prior to seeing Dr Roxy Manns After further consideration Dr Oval Linsey will proceed with patient having cardiac cath and see Dr Roxy Manns as planned  Tried to call patient, no voicemail set up

## 2021-04-03 NOTE — H&P (View-Only) (Signed)
Cardiology Office Note   Date:  04/03/2021   ID:  Cyndel Griffey, DOB [birth year], MRN [id number]  PCP:  Forrest Moron, MD  Cardiologist:   Skeet Latch, MD  Oncologist: Samuel Bouche, MD Radiation Oncologist: Ladona Horns, MD Surgical Oncologist: Nancie Neas, MD  No chief complaint on file.   History of Present Illness: Marie Tanner is a 65 y.o. female with CAD s/p STEMI, hypertension, chronic systolic and diastolic heart failure, OSA not on CPAP, diabetes, prior DVT, breast cancer, and mitral regurgitation who presents for follow up.  In 2015 she had a STEMI and cardiac arrest. She was cared for at [facility]. She ultimately received 2 DES (LM/LAD and LCx).  She had a prolonged hospitalization requiring tracheostomy.  Her liver was lacerated during CPR.  She required surgical repair after femoral catheterization.  She also developed a DVT in the hospital.  One week after discharge she developed pneumonia and was admitted at [facility].  She has been seen in the hospital twice for chest pain and ruled out for MI.  She had a negative Myoview 05/2015.  She also had negative CT scans for PE.  Marie Tanner also underwent surgery for breast cancer on 02/21/16.  She completed her radiation in September 2017.  She was referred for sleep study which was abnormal.  However, she declined using the CPAP device and is in the process of being fitted for an oral airway.  Marie Tanner had an echo 06/2016 that showed LVEF 50-55% with grade 1 diastolic dysfunction.    Marie Tanner reported an episode of chest pain that occurred while driving.  She was referred for [facility] 11/24/17 that revealed LVEF 36% with anterior, anteroseptal, apical inferior and apical lateral defects.  There was concern for infarct with peri-infarct ischemia in the inferior and apical walls.  She underwent LHC 11/2017 that revealed patent stents that were heavily calcified and there was a step down after the takeoff of  the diagonal.  She had 40% RI, 40% proximal LCx and 20% prox LAD.  LVEF was 35-45% and LVEDP was elevated.  BiDil was added to her regimen for afterload reduction and poorly controlled hypertension.  She reported increased dyspnea on exertion and edema.  Lasix was increased for 2 days.  She was also referred for an echocardiogram 06/2018 that revealed LVEF 45 to 50% with hypokinesis of the mid to apical inferior and inferoseptal myocardium that was relatively unchanged from prior.  She was also started on spironolactone.  She had atypical chest pain and was referred for an ETT 10/2018 that was negative.  She achieved 6.2 METS on a Bruce protocol.    Marie Tanner reported episodes of dizziness.  Losartan was reduced to 12.5 mg she also had persistent episodes of chest discomfort that were atypical and occurred when lying down.  Thought to be secondary to GERD and her PPI was restarted.  At her last appointment Marie Tanner was struggling due to cost of medications.  She has been out of her BiDil, ticagrelor, and Praluent.  BiDil was switched to hydralazine and Imdur.  Her BP has been improving.  It has been as high as 170-180s She was started on patient assistance for ticagrelor and Praluent.    Marie Tanner BP was above goal, so hydralazine was increased. She called the office 01/19/2021 with chest pain and shortness of breath. She was referred for a nuclear stress test that revealed LVEF 39% with concern for infarct with peri-infarct ischemia.  She had a heart monitor 2/22 with 10 beats of NSVT. She also had an echo 03/19/2021 with LVEF 34-40% and moderate to severe mitral regurgitation.  She had an echo 02/2021 that revealed LVEF 35 to 40% and very eccentric mitral regurgitation.  Her left atrium was normal in size.  She was referred for TEE that revealed LVEF 45 to 50% with a very eccentric, anteriorly directed mitral regurgitant jet.  It was thought to be due to a small flail P3 scallop.  There is no  evidence of pulmonary flow reversal.  Based on pes of the ER OSA was 0.17 cm with a regurgitant volume of 30 mL.  However this was thought to be underestimated in the setting of anesthesia and an eccentric jet.  Overall it was thought to be moderate to severe.  At her last appointment she was started on both [place].  She has noted an improvement in her breathing.  However she has had vaginal discomfort and irritation.  She denies dysuria or hematuria.  She has not had any lower extremity edema, orthopnea, or PND.  She complains of pain in her right hip that limits her ability to exercise but she does try to walk regularly.  She has not needed to take any Lasix since starting the [facility].  She has not been checking her blood pressure at home regularly.  Past Medical History:  Diagnosis Date  . Breast cancer ([place])   . Cancer ([place])   . Chronic combined systolic and diastolic heart failure ([place]) 01/30/2015  . Coronary artery disease   . Depression   . DVT (deep venous thrombosis) ([place]) 11/15   left femerol artery injury during resusciation.  [name] GERD (gastroesophageal reflux disease)   . HCAP (healthcare-associated pneumonia) 11/22/2014  . Heart murmur   . High cholesterol   . History of blood transfusion 09/2014   related to MI  . History of gout   . Hypertension   . Liver laceration 10/19/14  . Palpitations 12/12/2020  . Pulmonary edema 10/19/14  . Renal disorder   . Right hip pain 04/03/2021  . Sleep apnea    "suppose to wear mask but I don't" (11/22/2014)  . STEMI (ST elevation myocardial infarction) ([place]) 10/19/14  . Type II diabetes mellitus ([place])   . Urticaria     Past Surgical History:  Procedure Laterality Date  . ABDOMINAL HYSTERECTOMY  ~ 2000  . APPLICATION OF WOUND VAC  10/2014   "over my naval" (11/22/2014)  . BREAST LUMPECTOMY WITH AXILLARY LYMPH NODE BIOPSY    . BREAST SURGERY    . [place]; 1982  . CORONARY ANGIOPLASTY WITH STENT PLACEMENT   09/2014   "multiple"/notes 11/22/2014  . EXTRACORPOREAL CIRCULATION    . Femerol artery repair Right 09/2014   pt insists it was a right femoral artery repair on 11/22/2014  . LAPAROSCOPIC ABDOMINAL EXPLORATION    . LEFT HEART CATH AND CORONARY ANGIOGRAPHY N/A 12/07/2017   Procedure: LEFT HEART CATH AND CORONARY ANGIOGRAPHY;  Surgeon: Leonie Man, MD;  Location: [place] [facility];  Service: Cardiovascular;  Laterality: N/A;  . LEFT VENTRICULAR ASSIST DEVICE     pt is not aware of this hx on 11/22/2014  . Liver laceration repair  09/2014   related to CPR/notes 11/22/2014  . TEE WITHOUT CARDIOVERSION N/A 03/13/2021   Procedure: TRANSESOPHAGEAL ECHOCARDIOGRAM (TEE);  Surgeon: [name], MD;  Location: [place];  Service: Cardiology;  Laterality: N/A;  . TRACHEOSTOMY  09/2014   "closed on it's own when they took it out"  . TUBAL LIGATION  1982     Current Outpatient Medications  Medication Sig Dispense Refill  . acetaminophen (TYLENOL) 500 MG tablet Take 1,000 mg by mouth every 6 (six) hours as needed for moderate pain.    . Alirocumab (PRALUENT) 150 MG/ML SOAJ Inject 150 mg into the skin every 14 (fourteen) days. 2 mL 11  . aspirin EC 81 MG tablet Take 1 tablet (81 mg total) by mouth daily.    . blood glucose meter kit and supplies KIT by Does not apply route daily as needed. Dispense based on patient and insurance preference. Use up to four times daily as directed. (FOR ICD-9 250.00, 250.01).    . BRILINTA 60 MG TABS tablet TAKE 1 TABLET (60 MG TOTAL) BY MOUTH 2 (TWO) TIMES DAILY. 180 tablet 2  . cholecalciferol (VITAMIN D3) 25 MCG (1000 UNIT) tablet Take 1,000 Units by mouth daily.    [name] exemestane (AROMASIN) 25 MG tablet Take 25 mg by mouth daily after breakfast.     . fluconazole (DIFLUCAN) 150 MG tablet Take 1 tablet (150 mg total) by mouth once for 1 dose. 1 tablet 0  . fluticasone (FLONASE) 50 MCG/ACT nasal spray 2 sprays per nostril at night for stuffy nose at 7pm  18.2 mL 5  . furosemide (LASIX) 40 MG tablet TAKE 1 TABLET AS NEEDED FOR SHORTNESS OF BREATH OR WEIGHT GAIN OF 2 POUNDS IN 24 HOURS OR 5 POUNDS IN 1 WEEK (Patient taking differently: Take 40 mg by mouth daily as needed (SHORTNESS OF BREATH OR WEIGHT GAIN OF 2 POUNDS IN 24 HOURS OR 5 POUNDS IN 1 WEEK).) 30 tablet 3  . glucose blood test strip Use as instructed. E11.5. To check blood glucose daily. 100 each 12  . magnesium oxide (MAG-OX) 400 MG tablet TAKE 1 TABLET BY MOUTH EVERY DAY 90 tablet 0  . metFORMIN (GLUCOPHAGE-XR) 500 MG 24 hr tablet TAKE 1 TABLET BY MOUTH EVERY DAY WITH BREAKFAST 90 tablet 1  . metoprolol succinate (TOPROL-XL) 25 MG 24 hr tablet Take 1 tablet (25 mg total) by mouth daily. 90 tablet 3  . nitroGLYCERIN (NITROSTAT) 0.4 MG SL tablet Place 1 tablet (0.4 mg total) under the tongue every 5 (five) minutes as needed for chest pain. max of 3 if no relief call 911 25 tablet PRN  . rosuvastatin (CRESTOR) 20 MG tablet Take 1 tablet by mouth 3 times per week (Patient taking differently: Take 10 mg by mouth every Monday, Wednesday, and Friday.) 30 tablet 6  . sacubitril-valsartan (ENTRESTO) 49-51 MG Take 1 tablet by mouth 2 (two) times daily. 60 tablet 11  . spironolactone (ALDACTONE) 25 MG tablet Take 1 tablet (25 mg total) by mouth daily. 90 tablet 2  . triamcinolone cream (KENALOG) 0.1 % Apply 1 application topically 2 (two) times daily. (Patient taking differently: Apply 1 application topically 2 (two) times daily as needed (rash).) 80 g 3  . isosorbide mononitrate (IMDUR) 30 MG 24 hr tablet Take 1 tablet (30 mg total) by mouth daily. 90 tablet 3   No current facility-administered medications for this visit.    Allergies:   Dilaudid [hydromorphone hcl], Epinephrine, and Lactose intolerance (gi)    Social History:  The patient  reports that she has never smoked. She has never used smokeless tobacco. She reports current alcohol use. She reports that she does not use drugs.   Family  History:  The patient's family  history includes Alzheimer's disease in her father; Asthma in her daughter; COPD in her mother; Diabetes in her brother and mother; Heart attack in her father; Hypertension in her mother.    ROS:  Please see the history of present illness.   Otherwise, review of systems are positive for left chest pain, shortness of breath, LE edema, and nausea.   All other systems are reviewed and negative.    PHYSICAL EXAM: VS:  BP 138/70   Pulse 71   Ht _0  (1.575 m)   Wt 189 lb (85.7 kg)   SpO2 99%   BMI 34.57 kg/m  , BMI Body mass index is 34.57 kg/m. GENERAL:  Well appearing HEENT: Pupils equal round and reactive, fundi not visualized, oral mucosa unremarkable NECK:  No jugular venous distention, waveform within normal limits, carotid upstroke brisk and symmetric, no bruits LUNGS:  Clear to auscultation bilaterally HEART:  RRR.  PMI not displaced or sustained, S1 and S2 within normal limits, no S3, no S4, no clicks, no rubs, III/VI systolic murmur anteriorly ABD:  Flat, positive bowel sounds normal in frequency in pitch, no bruits, no rebound, no guarding, no midline pulsatile mass, no hepatomegaly, no splenomegaly EXT:  2 plus pulses throughout, no edema, no cyanosis no clubbing SKIN:  No rashes no nodules NEURO:  Cranial nerves II through XII grossly intact, motor grossly intact throughout PSYCH:  Cognitively intact, oriented to person place and time   EKG:  03/06/2021: EKG was not ordered today. 02/11/16: Sinus arrhythmia.  Rate 71 bpm. Non-specific ST-T changes. 10/05/16: Sinus rhythm. Rate 85 bpm. Prior inferior infarct. 11/16/17: Sinus rhythm.  Rate 87 bpm.  Prior inferior infarct.  07/22/18: Sinu srhythm.  Rate 90 bpm.  Prior inferior infarct.   02/08/2019: Sinus rhythm.  Rate 80 bpm.  Prior inferior infarct. 03/12/2020: Sinus rhythm.  Rate 81 bpm.  Prior inferior infarct.  07/26/18: Study Conclusions  - Left ventricle: The cavity size was normal. Systolic  function was   mildly reduced. The estimated ejection fraction was in the range   of 45% to 50%. Hypokinesis of the mid-apicalinferior and   inferoseptal myocardium. Doppler parameters are consistent with   abnormal left ventricular relaxation (grade 1 diastolic   dysfunction). Acoustic contrast opacification revealed no   evidence ofthrombus. - Mitral valve: There was moderate regurgitation directed   eccentrically and anteriorly. - Left atrium: The atrium was mildly dilated. - Atrial septum: No defect or patent foramen ovale was identified.  Impressions:  - Direct comparison to images from August 2917 shows no change in   wall motion pattern or overall LV EF. [name]  Lexiscan Myoview 11/24/17:  The left ventricular ejection fraction is moderately decreased (30-44%).  Nuclear stress EF: 36%.  There was no ST segment deviation noted during stress.  There is a large defect of severe severity present in the mid anterior, mid inferior, apical anterior, apical septal, apical inferior, apical lateral and apex location. The defect is partially reversible. This is consistent with infarct with small amount of peri infarct ischemia in the inferior and apical walls.  This is a high risk study.  Compared to prior study the EF has declined and there appears to be peri infarct ischemia in the inferior wall although this could also be variations in diaphragmatic attenuation artifact.   LHC 12/07/17:  Mid LM to Ost LAD DES Stent is 10% stenosed. -This entire segment is heavily calcified. There is a significant step down after the stent at the takeoff  of the diagonal branches.  Prox LAD lesion is 20% stenosed.  Ost Ramus lesion is 40% stenosed. Proc Circumflex ~40%.  There is moderate left ventricular systolic dysfunction. The left ventricular ejection fraction is 35-45% by visual estimate.  LV end diastolic pressure is moderately elevated. 22-24 mmHg  ETT 10/2018: No ischemia.  6.2 METS on  Bruce protocol.[name]  ECHO 03/04/2021: 1. Consider TEE to further evaluate mitral regugitation, very eccentric  jet which may be more severe than depicted on color Doppler.  2. Left ventricular ejection fraction, by estimation, is 35 to 40%. The  left ventricle has moderately decreased function. The left ventricle  demonstrates global hypokinesis. There is mild left ventricular  hypertrophy. Left ventricular diastolic  parameters are consistent with Grade I diastolic dysfunction (impaired  relaxation).  3. Right ventricular systolic function is normal. The right ventricular  size is normal.  4. The mitral valve is normal in structure. Moderate to severe mitral  valve regurgitation. No evidence of mitral stenosis.  5. The aortic valve is tricuspid. There is mild calcification of the  aortic valve. There is mild thickening of the aortic valve. Aortic valve  regurgitation is not visualized. No aortic stenosis is present.  6. The inferior vena cava is normal in size with greater than 50%  respiratory variability, suggesting right atrial pressure of 3 mmHg.  Non-telemetry monitor study 01/22/2021: 30 Day Event Monitor  Quality: Fair.  Baseline artifact. Predominant rhythm: sinus rhythm                         Average heart rate: 81 bpm Max heart rate: 157 bpm Min heart rate: 47 bpm  10 beats NSVT Patient did submit a symptom diary.  She reported shortness of breath and heart fluttering at which time sinus rhythm was noted.    01/17/2021 Stress Test : Study Highlights    The left ventricular ejection fraction is moderately decreased (30-44%).  Nuclear stress EF: 39%.  Blood pressure demonstrated a hypertensive response to exercise.  Downsloping ST segment depression of 0.5 mm was noted in the II and aVF leads (ST deviation beginning in recovery).  Defect 1: There is a medium defect of moderate severity present in the apical anterior and apex location.  Defect 2: There is  a medium defect of severe severity present in the mid inferior and apical inferior location.  Findings consistent with prior myocardial infarction with peri-infarct ischemia.  This is an intermediate risk study.   Intermediate risk stress nuclear study with evidence of previous infarction in the distal LAD artery and posterior descending artery distribution, with very limited peri-infarct ischemia.  Moderately reduced left ventricular global systolic function. No significant change since 2018.  TEE 03/13/21: 1. Very eccentric, anteriorly directed jet of mitral valve regurgitation  with small flail segment at P3 scallop. No systolic reversals in pulmonary  veins. Mitral valve regurgitation appears moderate-severe. PISA  quantitation with angle correction  results in ERO 0.17 cm2 with regurgitant volume 30 mL. Patient's systolic  blood pressure was approximately 90 mmHg at time of MV regurgitation  evaluation, suspect degree of regurgitation may appear worse at ambulatory  blood pressures given flail segment.  The mitral valve is abnormal. Moderate to severe mitral valve  regurgitation. No evidence of mitral stenosis.  2. Left ventricular ejection fraction, by estimation, is 45 to 50%. The  left ventricle has mildly decreased function.  3. Right ventricular systolic function is normal. The right ventricular  size  is normal.  4. Left atrial size was moderately dilated. No left atrial/left atrial  appendage thrombus was detected. The LAA emptying velocity was 68 cm/s.  5. Right atrial size was moderately dilated.  6. A small pericardial effusion is present.  7. The aortic valve is normal in structure. Aortic valve regurgitation is  not visualized. No aortic stenosis is present.  8. There is Moderate (Grade III) atheroma plaque involving the transverse  and descending aorta.  9. Agitated saline contrast bubble study was positive with shunting  observed after >6 cardiac cycles  suggestive of intrapulmonary shunting.   Recent Labs: 03/05/2021: ALT 13; BUN 13; Creatinine, Ser 0.71; Hemoglobin 12.7; Magnesium 1.8; Platelets 285; Potassium 4.9; Sodium 135; TSH 2.990    Lipid Panel    Component Value Date/Time   CHOL 166 03/05/2021 1132   TRIG 69 03/05/2021 1132   HDL 56 03/05/2021 1132   CHOLHDL 3.0 03/05/2021 1132   CHOLHDL 3.1 10/05/2016 0937   VLDL 15 10/05/2016 0937   LDLCALC 97 03/05/2021 1132      Wt Readings from Last 3 Encounters:  04/03/21 189 lb (85.7 kg)  03/13/21 189 lb (85.7 kg)  03/06/21 186 lb 6.4 oz (84.6 kg)      ASSESSMENT AND PLAN: No problem-specific Assessment & Plan notes found for this encounter.  # CAD s/p STEMI: # Chronic angina: # Hyperlipidemia:  # NSVT: Currently denies angina.  Most recent nuclear stress 12/2020 with LVEF 39% with prior infarct in the distal LAD and PDA with very limited peri-infarct ischemia.  Marie Tanner had an ST elevation myocardial infarction with stents placed in the LAD/left main and left circumflex arteries.  She is on low dose ticagrelor indefinitely.  LHC 11/2017 showed patent stents with 20-40% lesions.  No more chest pain.  Continue medical management with aspirin, Praluent, metoprolol, and Imdur.  # Chronic systolic and diastolic heart failure: LVEF 45-50%.    She is not tolerating Jardiance due to vaginal irritation.  We will discontinue it.  We will check a UA and give her a dose of fluconazole.  Blood pressure will allow for [place] to titrate her Entresto.  Increase the dose to 49/51 mg twice daily.  Continue metoprolol, spironolactone, and Lasix as needed.  Check a basic metabolic panel in a week.    # Moderate mitral regurgitation: TEE and TTE personally reviewed.  We will have her see the structural heart team regarding her mitral regurgitation.  It is a very eccentric jet that is anteriorly directed.  Typically due to the Coanda effect this represents severe MR, though her numbers by [facility] during  the exam were more consistent with mild regurgitation.  She does have a flail P3 segment.  Unclear if she would be a candidate for a MitraClip.  She is hesitant about having surgery but would do it if necessary.  # Hypertensive heart disease: BP is above goal.    Increase Entresto as above.  # OSA: Oral airway device.   # Shortness of breath:  # Moderate-severe MR:  # Extracardiac shunt:  On TEE there was a late positive bubble study.  This is concerning for an intrapulmonary shunt.  Her last chest imaging was in 2020 and there was no evidence of this.  She has no polycythemia and no right heart dysfunction or pulm elevated pulmonary pressures.  We will repeat a chest x-ray to make sure there is no sign of abnormality.  #Right hip pain: Check hip x-rays.  She will  follow-up with Dr. Nolon Rod about this.  Current medicines are reviewed at length with the patient today.  The patient does not have concerns regarding medicines.  The following changes have been made: none  Labs/ tests ordered today include:   Orders Placed This Encounter  Procedures  . DG Chest 2 View  . DG HIP UNILAT WITH PELVIS 2-3 VIEWS RIGHT  . Urinalysis  . Basic metabolic panel  . Ambulatory referral to Structural Heart/Valve Clinic (only at [place])    Disposition:   FU with Marie Krummel C. Oval Linsey, MD, [facility] in 2 months.    Signed, Maelin Kurkowski C. Oval Linsey, MD, [facility]  04/03/2021 11:37 AM    [place]

## 2021-04-03 NOTE — Patient Instructions (Addendum)
Medication Instructions:  TAKE FLUCONAZOLE 150 MG 1 DOSE ONLY    STOP JARDIANCE   INCREASE YOUR ENTRESTO TO 49-51 MG TWICE A DAY  OK TO DOUBLE UP ON WHAT YOU HAVE UNTIL YOU RUN OUT  *If you need a refill on your cardiac medications before your next appointment, please call your pharmacy*  Lab Work: URINALYSIS TODAY   BMET IN 1 WEEK   If you have labs (blood work) drawn today and your tests are completely normal, you will receive your results only by: [name] MyChart Message (if you have MyChart) OR . A paper copy in the mail If you have any lab test that is abnormal or we need to change your treatment, we will call you to review the results.  Testing/Procedures: A chest x-ray takes a picture of the organs and structures inside the chest, including the heart, lungs, and blood vessels. This test can show several things, including, whether the heart is enlarges; whether fluid is building up in the lungs; and whether pacemaker / defibrillator leads are still in place.  RIGHT HIP XRAY   BOTH OF THE ABOVE AT [place]  Follow-Up: At [facility], you and your health needs are our priority.  As part of our continuing mission to provide you with exceptional heart care, we have created designated Provider Care Teams.  These Care Teams include your primary Cardiologist (physician) and Advanced Practice Providers (APPs -  Physician Assistants and Nurse Practitioners) who all work together to provide you with the care you need, when you need it.  We recommend signing up for the patient portal called "MyChart".  Sign up information is provided on this After Visit Summary.  MyChart is used to connect with patients for Virtual Visits (Telemedicine).  Patients are able to view lab/test results, encounter notes, upcoming appointments, etc.  Non-urgent messages can be sent to your provider as well.   To learn more about what you can do with MyChart, go to [email or web address].    Your  next appointment:   2 month(s)  The format for your next appointment:   In Person  Provider:   DR Fife APP AT [place]

## 2021-04-04 LAB — URINALYSIS
Bilirubin, UA: NEGATIVE
Glucose, UA: NEGATIVE
Ketones, UA: NEGATIVE
Leukocytes,UA: NEGATIVE
Nitrite, UA: NEGATIVE
RBC, UA: NEGATIVE
Specific Gravity, UA: 1.017 (ref 1.005–1.030)
Urobilinogen, Ur: 0.2 mg/dL (ref 0.2–1.0)
pH, UA: 6.5 (ref 5.0–7.5)

## 2021-04-07 NOTE — Progress Notes (Signed)
Marie Tanner DOB 10-12-1956  This looks like a clippable valve. The fossa looks approachable for transseptal puncture in both the SAXB and Bicaval views. LA dimensions are large enough for device steering and straddle. MR jet is eccentric and anteriorly directed due to a posterior leaflet flail. The posterior leaflet is measures 1 cm in the 128 LVOT grasping view. MVA measures about 4.95cm2 in the Transgastric view. Gradient was not provided. I'd like to verify both in the procedure prior to the start. Based on this information, I would recommend an NTW to start and assess for gradient.  Thank you for uploading!

## 2021-04-09 NOTE — Telephone Encounter (Signed)
Spoke with patient and she is willing to proceed with cardiac cath

## 2021-04-10 NOTE — Telephone Encounter (Signed)
Patient scheduled for cath 5/23 at 7:30 am with Dr End  No answer when called patient, will try again tomorrow

## 2021-04-16 ENCOUNTER — Encounter: Payer: Self-pay | Admitting: *Deleted

## 2021-04-16 NOTE — Telephone Encounter (Signed)
Patient aware of date, time, location, and need for labs Cath instruction letter placed at front for pick up when comes for labs

## 2021-04-16 NOTE — Progress Notes (Signed)
I did measure a gradient just didn't report it. Mean gradient 2.6 mmHg at HR of 82 bpm. Clip 81 in the study. Just fyi so that doesn't have to be repeated.  [place] (Dr Margaretann Loveless)

## 2021-04-17 ENCOUNTER — Telehealth: Payer: Self-pay | Admitting: *Deleted

## 2021-04-17 NOTE — Telephone Encounter (Signed)
Pt contacted pre-catheterization scheduled at [facility] for: Monday Apr 21, 2021 7:30 AM Verified arrival time and place: [facility]) at: 5:30 AM   No solid food after midnight prior to cath, clear liquids until 5 AM day of procedure.  Hold: Metformin-day of procedure and 48 hours post procedure Spironolactone-AM of procedure Lasix-AM of procedure  Except hold medications AM meds can be  taken pre-cath with sips of water including: ASA 81 mg Brilinta 60 mg  Confirmed patient has responsible adult to drive home post procedure and be with patient first 24 hours after arriving home: yes  You are allowed ONE visitor in the waiting room during the time you are at the hospital for your procedure. Both you and your visitor must wear a mask once you enter the hospital.   Reviewed procedure/mask/visitor instructions with patient.

## 2021-04-18 ENCOUNTER — Other Ambulatory Visit: Payer: Self-pay | Admitting: *Deleted

## 2021-04-18 DIAGNOSIS — I340 Nonrheumatic mitral (valve) insufficiency: Secondary | ICD-10-CM

## 2021-04-18 LAB — BASIC METABOLIC PANEL
BUN/Creatinine Ratio: 19 (ref 12–28)
BUN: 14 mg/dL (ref 8–27)
CO2: 22 mmol/L (ref 20–29)
Calcium: 9.3 mg/dL (ref 8.7–10.3)
Chloride: 103 mmol/L (ref 96–106)
Creatinine, Ser: 0.74 mg/dL (ref 0.57–1.00)
Glucose: 112 mg/dL — ABNORMAL HIGH (ref 65–99)
Potassium: 4.9 mmol/L (ref 3.5–5.2)
Sodium: 140 mmol/L (ref 134–144)
eGFR: 91 mL/min/{1.73_m2} (ref >59)

## 2021-04-18 LAB — CBC WITH DIFFERENTIAL/PLATELET
Basophils Absolute: 0 10*3/uL (ref 0.0–0.2)
Basos: 0 %
EOS (ABSOLUTE): 0.1 10*3/uL (ref 0.0–0.4)
Eos: 2 %
Hematocrit: 39.1 % (ref 34.0–46.6)
Hemoglobin: 12.6 g/dL (ref 11.1–15.9)
Immature Grans (Abs): 0 10*3/uL (ref 0.0–0.1)
Immature Granulocytes: 0 %
Lymphocytes Absolute: 1.4 10*3/uL (ref 0.7–3.1)
Lymphs: 39 %
MCH: 27.1 pg (ref 26.6–33.0)
MCHC: 32.2 g/dL (ref 31.5–35.7)
MCV: 84 fL (ref 79–97)
Monocytes Absolute: 0.4 10*3/uL (ref 0.1–0.9)
Monocytes: 13 %
Neutrophils Absolute: 1.6 10*3/uL (ref 1.4–7.0)
Neutrophils: 46 %
Platelets: 261 10*3/uL (ref 150–450)
RBC: 4.65 x10E6/uL (ref 3.77–5.28)
RDW: 13.5 % (ref 11.7–15.4)
WBC: 3.5 10*3/uL (ref 3.4–10.8)

## 2021-04-18 MED ORDER — SODIUM CHLORIDE 0.9% FLUSH: 3.0000 mL | Freq: Two times a day (BID) | INTRAVENOUS | Status: DC

## 2021-04-21 ENCOUNTER — Encounter (HOSPITAL_COMMUNITY): Payer: Self-pay | Admitting: Internal Medicine

## 2021-04-21 ENCOUNTER — Ambulatory Visit (HOSPITAL_COMMUNITY)
Admission: RE | Admit: 2021-04-21 | Discharge: 2021-04-21 | Disposition: A | Discharge status: Home / Self Care | Payer: Medicaid Other | Attending: Internal Medicine | Admitting: Internal Medicine

## 2021-04-21 ENCOUNTER — Telehealth: Payer: Self-pay | Admitting: Cardiovascular Disease

## 2021-04-21 ENCOUNTER — Encounter (HOSPITAL_COMMUNITY)
Admission: RE | Disposition: A | Discharge status: Home / Self Care | Payer: Self-pay | Source: Home / Self Care | Attending: Internal Medicine

## 2021-04-21 DIAGNOSIS — Z7984 Long term (current) use of oral hypoglycemic drugs: Secondary | ICD-10-CM | POA: Insufficient documentation

## 2021-04-21 DIAGNOSIS — Z853 Personal history of malignant neoplasm of breast: Secondary | ICD-10-CM | POA: Insufficient documentation

## 2021-04-21 DIAGNOSIS — Z8249 Family history of ischemic heart disease and other diseases of the circulatory system: Secondary | ICD-10-CM | POA: Insufficient documentation

## 2021-04-21 DIAGNOSIS — Z8674 Personal history of sudden cardiac arrest: Secondary | ICD-10-CM | POA: Insufficient documentation

## 2021-04-21 DIAGNOSIS — E119 Type 2 diabetes mellitus without complications: Secondary | ICD-10-CM | POA: Insufficient documentation

## 2021-04-21 DIAGNOSIS — E785 Hyperlipidemia, unspecified: Secondary | ICD-10-CM | POA: Insufficient documentation

## 2021-04-21 DIAGNOSIS — Z90710 Acquired absence of both cervix and uterus: Secondary | ICD-10-CM | POA: Insufficient documentation

## 2021-04-21 DIAGNOSIS — R0602 Shortness of breath: Secondary | ICD-10-CM | POA: Insufficient documentation

## 2021-04-21 DIAGNOSIS — Z79899 Other long term (current) drug therapy: Secondary | ICD-10-CM | POA: Insufficient documentation

## 2021-04-21 DIAGNOSIS — M25551 Pain in right hip: Secondary | ICD-10-CM | POA: Insufficient documentation

## 2021-04-21 DIAGNOSIS — I110 Hypertensive heart disease with heart failure: Secondary | ICD-10-CM | POA: Insufficient documentation

## 2021-04-21 DIAGNOSIS — I5042 Chronic combined systolic (congestive) and diastolic (congestive) heart failure: Secondary | ICD-10-CM | POA: Diagnosis present

## 2021-04-21 DIAGNOSIS — Z86718 Personal history of other venous thrombosis and embolism: Secondary | ICD-10-CM | POA: Insufficient documentation

## 2021-04-21 DIAGNOSIS — I2510 Atherosclerotic heart disease of native coronary artery without angina pectoris: Secondary | ICD-10-CM | POA: Insufficient documentation

## 2021-04-21 DIAGNOSIS — E7800 Pure hypercholesterolemia, unspecified: Secondary | ICD-10-CM | POA: Insufficient documentation

## 2021-04-21 DIAGNOSIS — Z7982 Long term (current) use of aspirin: Secondary | ICD-10-CM | POA: Insufficient documentation

## 2021-04-21 DIAGNOSIS — Z885 Allergy status to narcotic agent status: Secondary | ICD-10-CM | POA: Insufficient documentation

## 2021-04-21 DIAGNOSIS — I259 Chronic ischemic heart disease, unspecified: Secondary | ICD-10-CM | POA: Diagnosis present

## 2021-04-21 DIAGNOSIS — I472 Ventricular tachycardia: Secondary | ICD-10-CM | POA: Insufficient documentation

## 2021-04-21 DIAGNOSIS — Z833 Family history of diabetes mellitus: Secondary | ICD-10-CM | POA: Insufficient documentation

## 2021-04-21 DIAGNOSIS — G4733 Obstructive sleep apnea (adult) (pediatric): Secondary | ICD-10-CM | POA: Insufficient documentation

## 2021-04-21 DIAGNOSIS — I252 Old myocardial infarction: Secondary | ICD-10-CM | POA: Insufficient documentation

## 2021-04-21 DIAGNOSIS — I272 Pulmonary hypertension, unspecified: Secondary | ICD-10-CM | POA: Insufficient documentation

## 2021-04-21 DIAGNOSIS — Z955 Presence of coronary angioplasty implant and graft: Secondary | ICD-10-CM | POA: Insufficient documentation

## 2021-04-21 DIAGNOSIS — I240 Acute coronary thrombosis not resulting in myocardial infarction: Secondary | ICD-10-CM | POA: Diagnosis present

## 2021-04-21 DIAGNOSIS — I340 Nonrheumatic mitral (valve) insufficiency: Secondary | ICD-10-CM | POA: Diagnosis present

## 2021-04-21 HISTORY — PX: RIGHT/LEFT HEART CATH AND CORONARY ANGIOGRAPHY: CATH118266

## 2021-04-21 LAB — POCT ACTIVATED CLOTTING TIME
Activated Clotting Time: 130 seconds
Activated Clotting Time: 130 seconds
Activated Clotting Time: 202 seconds

## 2021-04-21 LAB — POCT I-STAT EG7
Acid-base deficit: 1 mmol/L (ref 0.0–2.0)
Bicarbonate: 24.8 mmol/L (ref 20.0–28.0)
Calcium, Ion: 1.27 mmol/L (ref 1.15–1.40)
HCT: 36 % (ref 36.0–46.0)
Hemoglobin: 12.2 g/dL (ref 12.0–15.0)
O2 Saturation: 81 %
Potassium: 3.9 mmol/L (ref 3.5–5.1)
Sodium: 141 mmol/L (ref 135–145)
TCO2: 26 mmol/L (ref 22–32)
pCO2, Ven: 44.5 mmHg (ref 44.0–60.0)
pH, Ven: 7.354 (ref 7.250–7.430)
pO2, Ven: 48 mmHg — ABNORMAL HIGH (ref 32.0–45.0)

## 2021-04-21 LAB — POCT I-STAT 7, (LYTES, BLD GAS, ICA,H+H)
Acid-base deficit: 1 mmol/L (ref 0.0–2.0)
Bicarbonate: 23.9 mmol/L (ref 20.0–28.0)
Calcium, Ion: 1.26 mmol/L (ref 1.15–1.40)
HCT: 36 % (ref 36.0–46.0)
Hemoglobin: 12.2 g/dL (ref 12.0–15.0)
O2 Saturation: 99 %
Potassium: 4 mmol/L (ref 3.5–5.1)
Sodium: 140 mmol/L (ref 135–145)
TCO2: 25 mmol/L (ref 22–32)
pCO2 arterial: 39.8 mmHg (ref 32.0–48.0)
pH, Arterial: 7.387 (ref 7.350–7.450)
pO2, Arterial: 150 mmHg — ABNORMAL HIGH (ref 83.0–108.0)

## 2021-04-21 LAB — GLUCOSE, CAPILLARY
Glucose-Capillary: 89 mg/dL (ref 70–99)
Glucose-Capillary: 90 mg/dL (ref 70–99)

## 2021-04-21 SURGERY — RIGHT/LEFT HEART CATH AND CORONARY ANGIOGRAPHY
Anesthesia: LOCAL

## 2021-04-21 MED ORDER — METFORMIN HCL ER 500 MG PO TB24: 500.0000 mg | ORAL_TABLET | Freq: Every day | ORAL | Status: DC

## 2021-04-21 MED ORDER — SODIUM CHLORIDE 0.9% FLUSH: 3.0000 mL | INTRAVENOUS | Status: DC | PRN

## 2021-04-21 MED ORDER — LIDOCAINE HCL (PF) 1 % IJ SOLN
INTRAMUSCULAR | Status: DC | PRN
  Administered 2021-04-21 (×2): 2 mL
  Administered 2021-04-21: 10 mL

## 2021-04-21 MED ORDER — HEPARIN (PORCINE) IN NACL 1000-0.9 UT/500ML-% IV SOLN
INTRAVENOUS | Status: AC
  Filled 2021-04-21: qty 1000

## 2021-04-21 MED ORDER — FENTANYL CITRATE (PF) 100 MCG/2ML IJ SOLN
INTRAMUSCULAR | Status: AC
  Filled 2021-04-21: qty 2

## 2021-04-21 MED ORDER — HEPARIN (PORCINE) IN NACL 1000-0.9 UT/500ML-% IV SOLN
INTRAVENOUS | Status: DC | PRN
  Administered 2021-04-21 (×2): 500 mL

## 2021-04-21 MED ORDER — LABETALOL HCL 5 MG/ML IV SOLN: 10.0000 mg | INTRAVENOUS | Status: DC | PRN

## 2021-04-21 MED ORDER — SODIUM CHLORIDE 0.9 % IV SOLN: 250.0000 mL | INTRAVENOUS | Status: DC | PRN

## 2021-04-21 MED ORDER — ACETAMINOPHEN 325 MG PO TABS
650.0000 mg | ORAL_TABLET | ORAL | Status: DC | PRN
  Administered 2021-04-21: 650 mg via ORAL
  Filled 2021-04-21: qty 2

## 2021-04-21 MED ORDER — VERAPAMIL HCL 2.5 MG/ML IV SOLN
INTRAVENOUS | Status: AC
  Filled 2021-04-21: qty 2

## 2021-04-21 MED ORDER — HEPARIN SODIUM (PORCINE) 1000 UNIT/ML IJ SOLN
INTRAMUSCULAR | Status: AC
  Filled 2021-04-21: qty 1

## 2021-04-21 MED ORDER — ONDANSETRON HCL 4 MG/2ML IJ SOLN: 4.0000 mg | Freq: Four times a day (QID) | INTRAMUSCULAR | Status: DC | PRN

## 2021-04-21 MED ORDER — TRAMADOL HCL 50 MG PO TABS
50.0000 mg | ORAL_TABLET | Freq: Once | ORAL | Status: AC
  Administered 2021-04-21: 50 mg via ORAL
  Filled 2021-04-21: qty 1

## 2021-04-21 MED ORDER — FENTANYL CITRATE (PF) 100 MCG/2ML IJ SOLN
INTRAMUSCULAR | Status: DC | PRN
  Administered 2021-04-21 (×2): 25 ug via INTRAVENOUS

## 2021-04-21 MED ORDER — IOHEXOL 350 MG/ML SOLN
INTRAVENOUS | Status: DC | PRN
  Administered 2021-04-21: 35 mL

## 2021-04-21 MED ORDER — SODIUM CHLORIDE 0.9 % WEIGHT BASED INFUSION: 1.0000 mL/kg/h | INTRAVENOUS | Status: DC

## 2021-04-21 MED ORDER — SODIUM CHLORIDE 0.9 % WEIGHT BASED INFUSION
3.0000 mL/kg/h | INTRAVENOUS | Status: AC
  Administered 2021-04-21: 3 mL/kg/h via INTRAVENOUS

## 2021-04-21 MED ORDER — MIDAZOLAM HCL 2 MG/2ML IJ SOLN
INTRAMUSCULAR | Status: DC | PRN
  Administered 2021-04-21 (×2): 1 mg via INTRAVENOUS

## 2021-04-21 MED ORDER — HYDRALAZINE HCL 20 MG/ML IJ SOLN
10.0000 mg | INTRAMUSCULAR | Status: DC | PRN
Start: 2021-04-21 — End: 2021-04-21

## 2021-04-21 MED ORDER — VERAPAMIL HCL 2.5 MG/ML IV SOLN
INTRAVENOUS | Status: DC | PRN
  Administered 2021-04-21: 10 mL via INTRA_ARTERIAL

## 2021-04-21 MED ORDER — HEPARIN SODIUM (PORCINE) 1000 UNIT/ML IJ SOLN
INTRAMUSCULAR | Status: DC | PRN
  Administered 2021-04-21: 4000 [IU] via INTRAVENOUS

## 2021-04-21 MED ORDER — ASPIRIN 81 MG PO CHEW: 81.0000 mg | CHEWABLE_TABLET | ORAL | Status: DC

## 2021-04-21 MED ORDER — SODIUM CHLORIDE 0.9% FLUSH: 3.0000 mL | Freq: Two times a day (BID) | INTRAVENOUS | Status: DC

## 2021-04-21 MED ORDER — SODIUM CHLORIDE 0.9 % IV SOLN: INTRAVENOUS | Status: AC

## 2021-04-21 MED ORDER — LIDOCAINE HCL (PF) 1 % IJ SOLN
INTRAMUSCULAR | Status: AC
  Filled 2021-04-21: qty 30

## 2021-04-21 MED ORDER — MIDAZOLAM HCL 2 MG/2ML IJ SOLN
INTRAMUSCULAR | Status: AC
  Filled 2021-04-21: qty 2

## 2021-04-21 SURGICAL SUPPLY — 16 items
CATH INFINITI 5FR JL4 (CATHETERS) ×2 IMPLANT
CATH INFINITI JR4 5F (CATHETERS) ×2 IMPLANT
CATH SWAN GANZ 7F STRAIGHT (CATHETERS) ×2 IMPLANT
DEVICE RAD COMP TR BAND LRG (VASCULAR PRODUCTS) ×2 IMPLANT
GLIDESHEATH SLEND SS 6F .021 (SHEATH) ×2 IMPLANT
GLIDESHEATH SLENDER 7FR .021G (SHEATH) ×2 IMPLANT
GUIDEWIRE .025 260CM (WIRE) ×2 IMPLANT
GUIDEWIRE INQWIRE 1.5J.035X260 (WIRE) ×1 IMPLANT
INQWIRE 1.5J .035X260CM (WIRE) ×2
KIT HEART LEFT (KITS) ×2 IMPLANT
KIT MICROPUNCTURE NIT STIFF (SHEATH) ×2 IMPLANT
PACK CARDIAC CATHETERIZATION (CUSTOM PROCEDURE TRAY) ×2 IMPLANT
SHEATH PINNACLE 7F 10CM (SHEATH) ×2 IMPLANT
SHEATH PROBE COVER 6X72 (BAG) ×2 IMPLANT
TRANSDUCER W/STOPCOCK (MISCELLANEOUS) ×6 IMPLANT
TUBING CIL FLEX 10 FLL-RA (TUBING) ×2 IMPLANT

## 2021-04-21 NOTE — Telephone Encounter (Signed)
[name] is agreeable to having LHC/RHC to better evaluate her mitral valve for possible surgical repair.  Risks and benefits of cardiac catheterization have been discussed with the patient.  The patient understands that risks included but are not limited to stroke (1 in 1000), death (1 in 65), kidney failure [usually temporary] (1 in 500), bleeding (1 in 200), allergic reaction [possibly serious] (1 in 200). The patient understands and agrees to proceed.   Stuart Mirabile C. Oval Linsey, MD, [facility]  04/21/2021 7:44 AM

## 2021-04-21 NOTE — Progress Notes (Signed)
Client states relief from left wrist pain and c/o 7/10 headache; Ria Comment, NP notified and order noted

## 2021-04-21 NOTE — Progress Notes (Signed)
Up and tolerated well; left groin stable, no bleeding or hematoma

## 2021-04-21 NOTE — Interval H&P Note (Signed)
History and Physical Interval Note:  04/21/2021 7:12 AM  [name]  has presented today for surgery, with the diagnosis of shortness of breath, mitral regurgitation, and chronic systolic heart failure.  The various methods of treatment have been discussed with the patient and family. After consideration of risks, benefits and other options for treatment, the patient has consented to  Procedure(s): RIGHT/LEFT HEART CATH AND CORONARY ANGIOGRAPHY (N/A) as a surgical intervention.  The patient's history has been reviewed, patient examined, no change in status, stable for surgery.  I have reviewed the patient's chart and labs.  Questions were answered to the patient's satisfaction.    Cath Lab Visit (complete for each Cath Lab visit)  Clinical Evaluation Leading to the Procedure:   ACS: No.  Non-ACS:    Anginal/Heart Failure Classification: NYHA class III  Anti-ischemic medical therapy: Maximal Therapy (2 or more classes of medications)  Non-Invasive Test Results: Intermediate-risk stress test findings: cardiac mortality 1-3%/year  Prior CABG: No previous CABG  Zinia Innocent

## 2021-04-21 NOTE — Progress Notes (Signed)
1200: TR band removed, gauze with Tegaderm placed, site unremarkable 1205: pt ambulated to bathroom, tolerated well  Will continue to monitor

## 2021-04-21 NOTE — Discharge Instructions (Signed)
Radial Site Care  This sheet gives you information about how to care for yourself after your procedure. Your health care provider may also give you more specific instructions. If you have problems or questions, contact your health care provider. What can I expect after the procedure? After the procedure, it is common to have:  Bruising and tenderness at the catheter insertion area. Follow these instructions at home: Medicines  Take over-the-counter and prescription medicines only as told by your health care provider. Insertion site care  Follow instructions from your health care provider about how to take care of your insertion site. Make sure you: ? Wash your hands with soap and water before you change your bandage (dressing). If soap and water are not available, use hand sanitizer. ? Change your dressing as told by your health care provider. ? Leave stitches (sutures), skin glue, or adhesive strips in place. These skin closures may need to stay in place for 2 weeks or longer. If adhesive strip edges start to loosen and curl up, you may trim the loose edges. Do not remove adhesive strips completely unless your health care provider tells you to do that.  Check your insertion site every day for signs of infection. Check for: ? Redness, swelling, or pain. ? Fluid or blood. ? Pus or a bad smell. ? Warmth.  Do not take baths, swim, or use a hot tub until your health care provider approves.  You may shower 24-48 hours after the procedure, or as directed by your health care provider. ? Remove the dressing and gently wash the site with plain soap and water. ? Pat the area dry with a clean towel. ? Do not rub the site. That could cause bleeding.  Do not apply powder or lotion to the site. Activity  For 24 hours after the procedure, or as directed by your health care provider: ? Do not flex or bend the affected arm. ? Do not push or pull heavy objects with the affected arm. ? Do not drive  yourself home from the hospital or clinic. You may drive 24 hours after the procedure unless your health care provider tells you not to. ? Do not operate machinery or power tools.  Do not lift anything that is heavier than 10 lb (4.5 kg), or the limit that you are told, until your health care provider says that it is safe.  Ask your health care provider when it is okay to: ? Return to work or school. ? Resume usual physical activities or sports. ? Resume sexual activity.   General instructions  If the catheter site starts to bleed, raise your arm and put firm pressure on the site. If the bleeding does not stop, get help right away. This is a medical emergency.  If you went home on the same day as your procedure, a responsible adult should be with you for the first 24 hours after you arrive home.  Keep all follow-up visits as told by your health care provider. This is important. Contact a health care provider if:  You have a fever.  You have redness, swelling, or yellow drainage around your insertion site. Get help right away if:  You have unusual pain at the radial site.  The catheter insertion area swells very fast.  The insertion area is bleeding, and the bleeding does not stop when you hold steady pressure on the area.  Your arm or hand becomes pale, cool, tingly, or numb. These symptoms may represent a serious   problem that is an emergency. Do not wait to see if the symptoms will go away. Get medical help right away. Call your local emergency services (911 in the [place] Do not drive yourself to the hospital. Summary  After the procedure, it is common to have bruising and tenderness at the site.  Follow instructions from your health care provider about how to take care of your radial site wound. Check the wound every day for signs of infection.  Do not lift anything that is heavier than 10 lb (4.5 kg), or the limit that you are told, until your health care provider says that it  is safe. This information is not intended to replace advice given to you by your health care provider. Make sure you discuss any questions you have with your health care provider. Document Revised: 12/22/2017 Document Reviewed: 12/22/2017 Elsevier Patient Education  2021 [place].   Femoral Site Care  This sheet gives you information about how to care for yourself after your procedure. Your health care provider may also give you more specific instructions. If you have problems or questions, contact your health care provider. What can I expect after the procedure? After the procedure, it is common to have:  Bruising that usually fades within 1-2 weeks.  Tenderness at the site. Follow these instructions at home: Wound care  Follow instructions from your health care provider about how to take care of your insertion site. Make sure you: ? Wash your hands with soap and water before you change your bandage (dressing). If soap and water are not available, use hand sanitizer. ? Change your dressing as told by your health care provider. ? Leave stitches (sutures), skin glue, or adhesive strips in place. These skin closures may need to stay in place for 2 weeks or longer. If adhesive strip edges start to loosen and curl up, you may trim the loose edges. Do not remove adhesive strips completely unless your health care provider tells you to do that.  Do not take baths, swim, or use a hot tub until your health care provider approves.  You may shower 24-48 hours after the procedure or as told by your health care provider. ? Gently wash the site with plain soap and water. ? Pat the area dry with a clean towel. ? Do not rub the site. This may cause bleeding.  Do not apply powder or lotion to the site. Keep the site clean and dry.  Check your femoral site every day for signs of infection. Check for: ? Redness, swelling, or pain. ? Fluid or blood. ? Warmth. ? Pus or a bad  smell. Activity  For the first 2-3 days after your procedure, or as long as directed: ? Avoid climbing stairs as much as possible. ? Do not squat.  Do not lift anything that is heavier than 10 lb (4.5 kg), or the limit that you are told, until your health care provider says that it is safe.  Rest as directed. ? Avoid sitting for a long time without moving. Get up to take short walks every 1-2 hours.  Do not drive for 24 hours if you were given a medicine to help you relax (sedative). General instructions  Take over-the-counter and prescription medicines only as told by your health care provider.  Keep all follow-up visits as told by your health care provider. This is important. Contact a health care provider if you have:  A fever or chills.  You have redness, swelling, or pain  around your insertion site. Get help right away if:  The catheter insertion area swells very fast.  You pass out.  You suddenly start to sweat or your skin gets clammy.  The catheter insertion area is bleeding, and the bleeding does not stop when you hold steady pressure on the area.  The area near or just beyond the catheter insertion site becomes pale, cool, tingly, or numb. These symptoms may represent a serious problem that is an emergency. Do not wait to see if the symptoms will go away. Get medical help right away. Call your local emergency services (911 in the [place] Do not drive yourself to the hospital. Summary  After the procedure, it is common to have bruising that usually fades within 1-2 weeks.  Check your femoral site every day for signs of infection.  Do not lift anything that is heavier than 10 lb (4.5 kg), or the limit that you are told, until your health care provider says that it is safe. This information is not intended to replace advice given to you by your health care provider. Make sure you discuss any questions you have with your health care provider. Document Revised:  07/19/2020 Document Reviewed: 07/19/2020 Elsevier Patient Education  [place].

## 2021-04-23 ENCOUNTER — Encounter: Payer: Self-pay | Admitting: Thoracic Surgery (Cardiothoracic Vascular Surgery)

## 2021-04-23 ENCOUNTER — Institutional Professional Consult (permissible substitution) (INDEPENDENT_AMBULATORY_CARE_PROVIDER_SITE_OTHER): Payer: Self-pay | Admitting: Thoracic Surgery (Cardiothoracic Vascular Surgery)

## 2021-04-23 ENCOUNTER — Other Ambulatory Visit: Payer: Self-pay

## 2021-04-23 VITALS — BP 138/77 | HR 79 | Resp 20 | Ht 62.0 in | Wt 189.0 lb

## 2021-04-23 DIAGNOSIS — I340 Nonrheumatic mitral (valve) insufficiency: Secondary | ICD-10-CM

## 2021-04-23 NOTE — Progress Notes (Addendum)
[address]       [place] [address]             [id number]     CARDIOTHORACIC SURGERY CONSULTATION REPORT  Referring Provider is Skeet Latch, MD PCP is Forrest Moron, MD  Chief Complaint  Patient presents with  . Mitral Regurgitation    [facility] consult, ECHO 03/04/21, TEE 03/13/21, Cardiac Cath 04/21/21    HPI:  Patient is a 65 year old morbidly obese African-American female with history of coronary artery disease, ischemic cardiomyopathy, chronic combined systolic and diastolic congestive heart failure, hypertension, obstructive sleep apnea not currently on CPAP, mitral regurgitation, hyperlipidemia, GE reflux disease, and deep venous thrombosis in the remote past who has been referred for surgical consultation to discuss treatment options for management of mitral regurgitation.  Patient's cardiac history dates back to 2015 when she suffered an acute myocardial infarction complicated by cardiogenic shock.  She was cared for at [facility] at that time and underwent PCI and stenting of the left main and proximal left anterior descending coronary artery as well as left circumflex coronary artery.  By report her recovery was very slow and complicated by the fact that she suffered liver laceration during CPR requiring laparotomy for repair.  She ultimately underwent tracheostomy for respiratory failure and convalesced after a prolonged recovery.  Following her recovery she moved to [facility] to live closer to her son.  Shortly after she was discharged from [facility] she was hospitalized at [facility] for pneumonia.  She has been followed for the last several years by Dr. Oval Linsey with known history of multivessel coronary artery disease, chronic combined systolic and diastolic congestive heart failure, hypertension, and mitral regurgitation.  She underwent surgery for breast cancer in September 2017 and completed radiation therapy for adjuvant  therapy.  She was diagnosed with obstructive sleep apnea but has not recently been using CPAP.  She has had multiple stress tests that have revealed evidence of remote history of infarction and possible peri-infarct ischemia.  Most recent follow-up echocardiogram performed March 19, 2021 revealed left ventricular ejection fraction estimated 34 to 40% with moderate to severe mitral regurgitation.  TEE was performed that confirmed an eccentric jet of mitral regurgitation.  There was found to be a small flail scallop of the P3 segment of the posterior leaflet.  Left ventricular ejection fraction was estimated 45 to 50%.  There was no evidence of systolic flow reversal in the pulmonary veins.  Using PISA mitral ERO was measured 0.17 cm with regurgitant volume estimated 30 mL.  Cardiothoracic surgical consultation was requested.  Patient is single and lives locally in [place] with her sister who accompanies her for her office consultation visit today.  She has been disabled since 2018 and does not work.  She describes chronic symptoms of exertional shortness of breath that wax and wane in severity.  She states that overall she feels better now than she did 5 years ago.  However, she does get short of breath with exertion and at times her shortness of breath occurs with low-level activity.  She does not get shortness of breath at rest.  She has not had any recent episodes of substernal chest pain or chest tightness either with activity or at rest.  She has occasional palpitations.  She states that she has had some dizzy spells since she was recently started on Entresto.  She has never had any syncope.  She states that she  is not comfortable lying flat in bed but she has not had episodes of PND.  She does not get lower extremity edema.  She still drives a car and can tend to ordinary chores around the house.  She spends most of her time cooking.  She does not exercise at all.  Her weight has been stable by  report.    Past Medical History:  Diagnosis Date  . Breast cancer ([place])   . Cancer ([place])   . Chronic combined systolic and diastolic heart failure ([place]) 01/30/2015  . Coronary artery disease   . Depression   . DVT (deep venous thrombosis) ([place]) 11/15   left femerol artery injury during resusciation.  [name] GERD (gastroesophageal reflux disease)   . HCAP (healthcare-associated pneumonia) 11/22/2014  . Heart murmur   . High cholesterol   . History of blood transfusion 09/2014   related to MI  . History of gout   . Hypertension   . Liver laceration 10/19/14  . Palpitations 12/12/2020  . Pulmonary edema 10/19/14  . Renal disorder   . Right hip pain 04/03/2021  . Sleep apnea    "suppose to wear mask but I don't" (11/22/2014)  . STEMI (ST elevation myocardial infarction) ([place]) 10/19/14  . Type II diabetes mellitus ([place])   . Urticaria     Past Surgical History:  Procedure Laterality Date  . ABDOMINAL HYSTERECTOMY  ~ 2000  . APPLICATION OF WOUND VAC  10/2014   "over my naval" (11/22/2014)  . BREAST LUMPECTOMY WITH AXILLARY LYMPH NODE BIOPSY    . BREAST SURGERY    . [place]; 1982  . CORONARY ANGIOPLASTY WITH STENT PLACEMENT  09/2014   "multiple"/notes 11/22/2014  . EXTRACORPOREAL CIRCULATION    . Femerol artery repair Right 09/2014   pt insists it was a right femoral artery repair on 11/22/2014  . LAPAROSCOPIC ABDOMINAL EXPLORATION    . LEFT HEART CATH AND CORONARY ANGIOGRAPHY N/A 12/07/2017   Procedure: LEFT HEART CATH AND CORONARY ANGIOGRAPHY;  Surgeon: Leonie Man, MD;  Location: [facility];  Service: Cardiovascular;  Laterality: N/A;  . LEFT VENTRICULAR ASSIST DEVICE     pt is not aware of this hx on 11/22/2014  . Liver laceration repair  09/2014   related to CPR/notes 11/22/2014  . RIGHT/LEFT HEART CATH AND CORONARY ANGIOGRAPHY N/A 04/21/2021   Procedure: RIGHT/LEFT HEART CATH AND CORONARY ANGIOGRAPHY;  Surgeon: Nelva Bush, MD;  Location: [facility];  Service: Cardiovascular;  Laterality: N/A;  . TEE WITHOUT CARDIOVERSION N/A 03/13/2021   Procedure: TRANSESOPHAGEAL ECHOCARDIOGRAM (TEE);  Surgeon: [name], MD;  Location: [facility];  Service: Cardiology;  Laterality: N/A;  . TRACHEOSTOMY  09/2014   "closed on it's own when they took it out"  . TUBAL LIGATION  1982    Family History  Problem Relation Age of Onset  . Diabetes Mother   . Hypertension Mother   . COPD Mother   . Heart attack Father   . Alzheimer's disease Father   . Diabetes Brother   . Asthma Daughter   . Stroke Neg Hx   . Colon cancer Neg Hx   . Stomach cancer Neg Hx   . Pancreatic cancer Neg Hx   . AAA (abdominal aortic aneurysm) Neg Hx     Social History   Socioeconomic History  . Marital status: Divorced    Spouse name: Not on file  . Number of children: 2  . Years of education:  Not on file  . Highest education level: Not on file  Occupational History  . Not on file  Tobacco Use  . Smoking status: Never Smoker  . Smokeless tobacco: Never Used  Vaping Use  . Vaping Use: Never used  Substance and Sexual Activity  . Alcohol use: Yes    Comment: occ  . Drug use: No  . Sexual activity: Not Currently  Other Topics Concern  . Not on file  Social History Narrative  . Not on file   Social Determinants of Health   Financial Resource Strain: Not on file  Food Insecurity: Not on file  Transportation Needs: Not on file  Physical Activity: Not on file  Stress: Not on file  Social Connections: Not on file  Intimate Partner Violence: Not on file    Current Outpatient Medications  Medication Sig Dispense Refill  . acetaminophen (TYLENOL) 500 MG tablet Take 1,000 mg by mouth every 6 (six) hours as needed for moderate pain.    . Alirocumab (PRALUENT) 150 MG/ML SOAJ Inject 150 mg into the skin every 14 (fourteen) days. 2 mL 11  . aspirin EC 81 MG tablet Take 1 tablet (81 mg total) by mouth daily.    . blood glucose meter  kit and supplies KIT by Does not apply route daily as needed. Dispense based on patient and insurance preference. Use up to four times daily as directed. (FOR ICD-9 250.00, 250.01).    . BRILINTA 60 MG TABS tablet TAKE 1 TABLET (60 MG TOTAL) BY MOUTH 2 (TWO) TIMES DAILY. (Patient taking differently: Take 60 mg by mouth 2 (two) times daily.) 180 tablet 2  . cholecalciferol (VITAMIN D3) 25 MCG (1000 UNIT) tablet Take 1,000 Units by mouth daily.    [name] exemestane (AROMASIN) 25 MG tablet Take 25 mg by mouth daily after breakfast.     . fluticasone (FLONASE) 50 MCG/ACT nasal spray 2 sprays per nostril at night for stuffy nose at 7pm (Patient taking differently: Place 2 sprays into both nostrils daily. at 7pm) 18.2 mL 5  . furosemide (LASIX) 40 MG tablet TAKE 1 TABLET AS NEEDED FOR SHORTNESS OF BREATH OR WEIGHT GAIN OF 2 POUNDS IN 24 HOURS OR 5 POUNDS IN 1 WEEK (Patient taking differently: Take 40 mg by mouth daily as needed (SHORTNESS OF BREATH OR WEIGHT GAIN OF 2 POUNDS IN 24 HOURS OR 5 POUNDS IN 1 WEEK).) 30 tablet 3  . glucose blood test strip Use as instructed. E11.5. To check blood glucose daily. 100 each 12  . magnesium oxide (MAG-OX) 400 MG tablet TAKE 1 TABLET BY MOUTH EVERY DAY (Patient taking differently: Take 400 mg by mouth daily.) 90 tablet 0  . [START ON 04/24/2021] metFORMIN (GLUCOPHAGE-XR) 500 MG 24 hr tablet Take 1 tablet (500 mg total) by mouth daily with breakfast.    . metoprolol succinate (TOPROL-XL) 25 MG 24 hr tablet Take 1 tablet (25 mg total) by mouth daily. 90 tablet 3  . nitroGLYCERIN (NITROSTAT) 0.4 MG SL tablet Place 1 tablet (0.4 mg total) under the tongue every 5 (five) minutes as needed for chest pain. max of 3 if no relief call 911 25 tablet PRN  . rosuvastatin (CRESTOR) 20 MG tablet Take 1 tablet by mouth 3 times per week (Patient taking differently: Take 10 mg by mouth every Monday, Wednesday, and Friday.) 30 tablet 6  . sacubitril-valsartan (ENTRESTO) 49-51 MG Take 1 tablet by  mouth 2 (two) times daily. 60 tablet 11  . spironolactone (ALDACTONE) 25  MG tablet Take 1 tablet (25 mg total) by mouth daily. 90 tablet 2  . triamcinolone cream (KENALOG) 0.1 % Apply 1 application topically 2 (two) times daily. (Patient taking differently: Apply 1 application topically 2 (two) times daily as needed (rash).) 80 g 3  . isosorbide mononitrate (IMDUR) 30 MG 24 hr tablet Take 1 tablet (30 mg total) by mouth daily. 90 tablet 3   Current Facility-Administered Medications  Medication Dose Route Frequency Provider Last Rate Last Admin  . sodium chloride flush (NS) 0.9 % injection 3 mL  3 mL Intravenous Q12H Skeet Latch, MD        Allergies  Allergen Reactions  . Dilaudid [Hydromorphone Hcl] Anaphylaxis, Itching and Swelling  . Epinephrine Anxiety and Other (See Comments)    Severe anxiety  . Lactose Intolerance (Gi) Diarrhea  . Jardiance [Empagliflozin]     YEAST INFECTION       Review of Systems:   General:  normal appetite, unchanged energy, no weight gain, no weight loss, no fever  Cardiac:  no chest pain with exertion, no chest pain at rest, + SOB with exertion, no resting SOB, no PND, + orthopnea, + palpitations, no arrhythmia, no atrial fibrillation, no LE edema, + dizzy spells, no syncope  Respiratory:  exertional shortness of breath, no home oxygen, no productive cough, no dry cough, no bronchitis, no wheezing, no hemoptysis, no asthma, no pain with inspiration or cough, + sleep apnea, no CPAP at night  GI:   no difficulty swallowing, no reflux, no frequent heartburn, no hiatal hernia, no abdominal pain, no constipation, no diarrhea, no hematochezia, no hematemesis, no melena  GU:   no dysuria,  no frequency, no urinary tract infection, no hematuria, no  kidney stones, no kidney disease  Vascular:  no pain suggestive of claudication, no pain in feet, + leg cramps, no varicose veins, + DVT in remote past, no non-healing foot ulcer  Neuro:   no stroke, no TIA's, no  seizures, no headaches, no temporary blindness one eye,  no slurred speech, + peripheral neuropathy, no chronic pain, no instability of gait, no memory/cognitive dysfunction  Musculoskeletal: + arthritis, no joint swelling, no myalgias, no difficulty walking, normal mobility   Skin:   no rash, no itching, no skin infections, no pressure sores or ulcerations  Psych:   no anxiety, no depression, no nervousness, no unusual recent stress  Eyes:   no blurry vision, + floaters, no recent vision changes, + wears glasses or contacts  ENT:   no hearing loss, no loose or painful teeth, no dentures, last saw dentist several years ago  Hematologic:  no easy bruising, no abnormal bleeding, no clotting disorder, no frequent epistaxis  Endocrine:  + diabetes, does check CBG's at home     Physical Exam:   BP 138/77 (BP Location: Left Arm, Patient Position: Sitting)   Pulse 79   Resp 20   Ht 5' 2"  (1.575 m)   Wt 189 lb (85.7 kg)   SpO2 98% Comment: RA  BMI 34.57 kg/m   General:  Morbidly obese female,  well-appearing  HEENT:  Unremarkable   Neck:   no JVD, no bruits, no adenopathy   Chest:   clear to auscultation, symmetrical breath sounds, no wheezes, no rhonchi   CV:   RRR, grade III/VI blowing holosystolic murmur best LLSB  Abdomen:  soft, non-tender, no masses   Extremities:  warm, well-perfused, pulses not palpable, no LE edema  Rectal/GU  Deferred  Neuro:  Grossly non-focal and symmetrical throughout  Skin:   Clean and dry, no rashes, no breakdown   Diagnostic Tests:  ECHOCARDIOGRAM REPORT       Patient Name:  SHALAYNE LEACH Date of Exam: 03/04/2021  Medical Rec #: [id number]    Height:    63.0 in  Accession #:  [id number]   Weight:    190.0 lb  Date of Birth: 03/31/1956    BSA:     1.892 m  Patient Age:  15 years    BP:      134/86 mmHg  Patient Gender: F        HR:      82 bpm.  Exam Location: [facility]   Procedure: 2D Echo,  Cardiac Doppler, Color Doppler and Intracardiac       Opacification Agent   Indications:  R93.1 Decreased EF    History:    Patient has prior history of Echocardiogram examinations,  most         recent 07/26/2018. CAD, Decreased EF, Arrythmias:Cardiac  Arrest;         Risk Factors:Hypertension and Obesity.    Sonographer:  Coralyn Helling RDCS  Referring Phys: [id number] [facility]      Sonographer Comments: Patient is morbidly obese. Image acquisition  challenging due to patient body habitus.  IMPRESSIONS    1. Consider TEE to further evaluate mitral regugitation, very eccentric  jet which may be more severe than depicted on color Doppler.  2. Left ventricular ejection fraction, by estimation, is 35 to 40%. The  left ventricle has moderately decreased function. The left ventricle  demonstrates global hypokinesis. There is mild left ventricular  hypertrophy. Left ventricular diastolic  parameters are consistent with Grade I diastolic dysfunction (impaired  relaxation).  3. Right ventricular systolic function is normal. The right ventricular  size is normal.  4. The mitral valve is normal in structure. Moderate to severe mitral  valve regurgitation. No evidence of mitral stenosis.  5. The aortic valve is tricuspid. There is mild calcification of the  aortic valve. There is mild thickening of the aortic valve. Aortic valve  regurgitation is not visualized. No aortic stenosis is present.  6. The inferior vena cava is normal in size with greater than 50%  respiratory variability, suggesting right atrial pressure of 3 mmHg.   Comparison(s): Prior images reviewed side by side. The left ventricular  function is worsened.   FINDINGS  Left Ventricle: Left ventricular ejection fraction, by estimation, is 35  to 40%. The left ventricle has moderately decreased function. The left  ventricle demonstrates global hypokinesis. Definity contrast  agent was  given IV to delineate the left  ventricular endocardial borders. The left ventricular internal cavity size  was normal in size. There is mild left ventricular hypertrophy. Left  ventricular diastolic parameters are consistent with Grade I diastolic  dysfunction (impaired relaxation).     LV Wall Scoring:  The apex is akinetic. The inferior septum is hypokinetic.   Right Ventricle: The right ventricular size is normal. No increase in  right ventricular wall thickness. Right ventricular systolic function is  normal.   Left Atrium: Left atrial size was normal in size.   Right Atrium: Right atrial size was normal in size.   Pericardium: There is no evidence of pericardial effusion.   Mitral Valve: The mitral valve is normal in structure. Moderate to severe  mitral valve regurgitation, with eccentric medially directed jet. No  evidence of mitral valve stenosis.   Tricuspid Valve:  The tricuspid valve is normal in structure. Tricuspid  valve regurgitation is not demonstrated. No evidence of tricuspid  stenosis.   Aortic Valve: The aortic valve is tricuspid. There is mild calcification  of the aortic valve. There is mild thickening of the aortic valve. Aortic  valve regurgitation is not visualized. No aortic stenosis is present.   Pulmonic Valve: The pulmonic valve was normal in structure. Pulmonic valve  regurgitation is not visualized. No evidence of pulmonic stenosis.   Aorta: The aortic root is normal in size and structure.   Venous: The inferior vena cava is normal in size with greater than 50%  respiratory variability, suggesting right atrial pressure of 3 mmHg.   IAS/Shunts: No atrial level shunt detected by color flow Doppler.   Additional Comments: Consider TEE to further evaluate mitral regugitation,  very eccentric jet which may be more severe than depicted on color  Doppler.     LEFT VENTRICLE  PLAX 2D  LVIDd:     4.05 cm Diastology  LVIDs:      3.25 cm LV e' medial:  6.42 cm/s  LV PW:     1.10 cm LV E/e' medial: 15.2  LV IVS:    1.20 cm LV e' lateral:  5.00 cm/s  LVOT diam:   1.90 cm LV E/e' lateral: 19.5  LV SV:     42  LV SV Index:  22  LVOT Area:   2.84 cm     RIGHT VENTRICLE       IVC  RV S prime:   11.30 cm/s IVC diam: 0.90 cm  TAPSE (M-mode): 1.6 cm   LEFT ATRIUM       Index    RIGHT ATRIUM      Index  LA diam:    3.55 cm 1.88 cm/m RA Pressure: 3.00 mmHg  LA Vol (A2C):  57.0 ml 30.13 ml/m RA Area:   8.50 cm  LA Vol (A4C):  62.5 ml 33.03 ml/m RA Volume:  15.70 ml 8.30 ml/m  LA Biplane Vol: 60.3 ml 31.87 ml/m  AORTIC VALVE  LVOT Vmax:  68.00 cm/s  LVOT Vmean: 50.700 cm/s  LVOT VTI:  0.147 m    AORTA  Ao Root diam: 2.90 cm  Ao Asc diam: 3.20 cm   MV E velocity: 97.40 cm/s  TRICUSPID VALVE  MV A velocity: 124.00 cm/s Estimated RAP: 3.00 mmHg  MV E/A ratio: 0.79               SHUNTS               Systemic VTI: 0.15 m               Systemic Diam: 1.90 cm   Candee Furbish MD  Electronically signed by Candee Furbish MD  Signature Date/Time: 03/04/2021/10:40:41 AM       TRANSESOPHOGEAL ECHO REPORT       Patient Name:  [name] Date of Exam: 03/13/2021  Medical Rec #: [id number]    Height:    62.0 in  Accession #:  [id number]   Weight:    189.0 lb  Date of Birth: [birth year]    BSA:     1.866 m  Patient Age:  20 years    BP:      131/62 mmHg  Patient Gender: F        HR:      89 bpm.  Exam Location: Inpatient   Procedure: Transesophageal Echo, 3D Echo, Color Doppler, Cardiac Doppler  and       Saline Contrast Bubble Study                 MODIFIED REPORT:  This report was modified by Cherlynn Kaiser MD on 03/15/2021 due to  revision.  Indications:   Mitral Valve Regurgitation    History:     Patient  has prior history of Echocardiogram examinations,  most          recent 03/04/2021. CHF, Previous Myocardial Infarction and  CAD;          Risk Factors:Diabetes and Hypertension.    Sonographer:   Bernadene Person RDCS  Referring Phys: [id number] [name]  Diagnosing Phys: Cherlynn Kaiser MD   PROCEDURE: After discussion of the risks and benefits of a TEE, an  informed consent was obtained from the patient. TEE procedure time was 33  minutes. The transesophogeal probe was passed without difficulty through  the esophogus of the patient. Imaged  were obtained with the patient in a left lateral decubitus position. Local  oropharyngeal anesthetic was provided with Cetacaine. Sedation performed  by different physician. The patient was monitored while under deep  sedation. Anesthestetic sedation was  provided intravenously by Anesthesiology: 557.69m of Propofol, 859mof  Lidocaine. Image quality was good. The patient's vital signs; including  heart rate, blood pressure, and oxygen saturation; remained stable  throughout the procedure. The patient  developed no complications during the procedure.   IMPRESSIONS    1. Very eccentric, anteriorly directed jet of mitral valve regurgitation  with small flail segment at P3 scallop. No systolic reversals in pulmonary  veins. Mitral valve regurgitation appears moderate-severe. PISA  quantitation with angle correction  results in ERO 0.17 cm2 with regurgitant volume 30 mL. Patient's systolic  blood pressure was approximately 90 mmHg at time of MV regurgitation  evaluation, suspect degree of regurgitation may appear worse at ambulatory  blood pressures given flail segment.  The mitral valve is abnormal. Moderate to severe mitral valve  regurgitation. No evidence of mitral stenosis.  2. Left ventricular ejection fraction, by estimation, is 45 to 50%. The  left ventricle has mildly decreased function.  3. Right  ventricular systolic function is normal. The right ventricular  size is normal.  4. Left atrial size was moderately dilated. No left atrial/left atrial  appendage thrombus was detected. The LAA emptying velocity was 68 cm/s.  5. Right atrial size was moderately dilated.  6. A small pericardial effusion is present.  7. The aortic valve is normal in structure. Aortic valve regurgitation is  not visualized. No aortic stenosis is present.  8. There is Moderate (Grade III) atheroma plaque involving the transverse  and descending aorta.  9. Agitated saline contrast bubble study was positive with shunting  observed after >6 cardiac cycles suggestive of intrapulmonary shunting.   FINDINGS  Left Ventricle: Left ventricular ejection fraction, by estimation, is 45  to 50%. The left ventricle has mildly decreased function. The left  ventricular internal cavity size was normal in size.   Right Ventricle: The right ventricular size is normal. No increase in  right ventricular wall thickness. Right ventricular systolic function is  normal.   Left Atrium: Left atrial size was moderately dilated. No left atrial/left  atrial appendage thrombus was detected. The LAA emptying velocity was 68  cm/s.   Right Atrium: Right atrial size was moderately dilated.   Pericardium: A small pericardial effusion is present.   Mitral Valve: Very eccentric, anteriorly directed jet of  mitral valve  regurgitation with small flail segment at P3 scallop. No systolic  reversals in pulmonary veins. Mitral valve regurgitation appears  moderate-severe. PISA quantitation with angle  correction results in ERO 0.17 cm2 with regurgitant volume 30 mL.  Patient's systolic blood pressure was approximately 90 mmHg at time of MV  regurgitation evaluation, suspect degree of regurgitation may appear worse  at ambulatory blood pressures given  flail segment. The mitral valve is abnormal. Moderate to severe mitral  valve  regurgitation. No evidence of mitral valve stenosis.   Tricuspid Valve: The tricuspid valve is normal in structure. Tricuspid  valve regurgitation is trivial. No evidence of tricuspid stenosis.   Aortic Valve: The aortic valve is normal in structure. Aortic valve  regurgitation is not visualized. No aortic stenosis is present.   Pulmonic Valve: The pulmonic valve was normal in structure. Pulmonic valve  regurgitation is trivial. No evidence of pulmonic stenosis.   Aorta: The aortic root and ascending aorta are structurally normal, with  no evidence of dilitation. There is moderate (Grade III) atheroma plaque  involving the transverse and descending aorta.   IAS/Shunts: No atrial level shunt detected by color flow Doppler. Agitated  saline contrast was given intravenously to evaluate for intracardiac  shunting. Agitated saline contrast bubble study was positive with shunting  observed after >6 cardiac cycles  suggestive of intrapulmonary shunting.       AORTA  Ao Root diam: 2.90 cm  Ao Asc diam: 2.90 cm   MR Peak grad:  121.0 mmHg  MR Mean grad:  70.0 mmHg  MR Vmax:     550.00 cm/s  MR Vmean:    382.0 cm/s  MR PISA:     5.09 cm  MR PISA Eff ROA: 36 mm  MR PISA Radius: 0.90 cm   Cherlynn Kaiser MD  Electronically signed by Cherlynn Kaiser MD  Signature Date/Time: 03/15/2021/4:20:06 PM       RIGHT/LEFT HEART CATH AND CORONARY ANGIOGRAPHY    Conclusion  Conclusions: 1. Nonobstructive coronary artery disease with up to 40% stenosis involving the mid LAD and mid LCx.  Overall appearance is similar to prior catheterization in 2019. 2. Patent LMCA/LAD stent. 3. Normal left and right heart filling pressures. 4. Mild pulmonary hypertension. 5. Normal to supranormal Fick cardiac output/index. 6. Normal transmitral gradient.  Recommendations: 1. Continue evidence-based heart failure therapy and ongoing management of mitral regurgitation per Dr.  Oval Linsey and valve team. 2. Secondary prevention of coronary artery disease.  Nelva Bush, MD [facility]   Recommendations  Antiplatelet/Anticoag Recommend dual antiplatelet therapy with Aspirin 24m daily long-term (beyond 12 months) because of ticagrelor 60 mg twice daily.  Discharge Date In the absence of any other complications or medical issues, we expect the patient to be ready for discharge from a cath perspective on 04/21/2021.   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 year-old woman with history of CAD s[facility]p [facility] and ECMO, hypertension, chronic systolic and diastolic heart failure, OSA not on CPAP, diabetes, prior DVT, breast cancer, and mitral regurgitation, presenting for evaluation of shortness of breath and chest pain in the setting of moderate to severe mitral regurgitation by TEE.  GFR: >60 ml/min  Procedure: The risks, benefits, complications,  treatment options, and expected outcomes were discussed with the patient. The patient and/or family concurred with the proposed plan, giving informed consent. The patient was sedated with IV midazolam and fentanyl. The left wrist and elbow were prepped and draped in a sterile fashion. 1% lidocaine was used for local anesthesia. Ultrasound was used to evaluate the left basilic vein. It was patent.  A micropuncture needle was used to access the left basilic vein under ultrasound guidance.  A 536F slender Glidesheath was advanced into the left basilic vein.  Resistance was encounter advancing a 536F balloon-tipped catheter, even with use of a 0.025" angled Glidewire, which passed without difficult to the  subclavian vein.  Sheath angiogram showed contrast extravasation and decision was made to switch to femoral vein access.  The basilic veins sheath was removed and hemostasis achieved with manual compression.  Ultrasound was used to evaluate the left femoral vein. It was patent.  A micropuncture needle was used to access the left femoral vein under ultrasound guidance.  A 536F sheath was placed in the left femoral vein using modified Seldinger technique.  Right heart catheterization was performed by advancing a 536F balloon-tipped catheter through the right heart chambers into the pulmonary capillary wedge position. Pressure measurements and oxygen saturations were obtained.  Ultrasound was used to evaluate the left radial artery. It was patent.  A micropuncture needle was used to access the left radial artery under ultrasound guidance.  Using the modified Seldinger access technique, a 41F slender Glidesheath was placed in the left radial artery. 3 mg Verapamil was given through the sheath. Heparin 4,000 units were administered.  Selective coronary angiography was performed using a 36F JL4 catheter to engage the left coronary artery and a 36F JR4 catheter to engage the right coronary artery. Left heart catheterization was performed using a 36F JR4 catheter. Left ventriculogram was not performed.  Simultaneous left ventricular and pulmonary capillary wedge pressures were obtained to calculate mitral valve gradient.  At the end of the procedure, the radial artery sheath was removed and a TR band applied to achieve patent hemostasis. The left femoral vein sheath was removed and hemostasis achieved with manual compression. There were no immediate complications. The patient was taken to the recovery area in stable condition.  Estimated blood loss <50 mL.   During this procedure medications were administered to achieve and maintain moderate conscious sedation while the patient's heart rate, blood pressure, and oxygen  saturation were continuously monitored and I was present face-to-face 100% of this time.   Medications (Filter: Administrations occurring from 0722 to 0859 on 04/21/21)  Heparin (Porcine) in NaCl 1000-0.9 UT/500ML-% SOLN (mL) Total volume:  1,000 mL  Date/Time Rate/Dose/Volume Action   04/21/21 0743 500 mL Given   0743 500 mL Given    midazolam (VERSED) injection (mg) Total dose:  2 mg  Date/Time Rate/Dose/Volume Action   04/21/21 0747 1 mg Given   0812 1 mg Given    fentaNYL (SUBLIMAZE) injection (mcg) Total dose:  50 mcg  Date/Time Rate/Dose/Volume Action   04/21/21 0747 25 mcg Given   0813 25 mcg Given    lidocaine (PF) (XYLOCAINE) 1 % injection (mL) Total volume:  14 mL  Date/Time Rate/Dose/Volume Action   04/21/21 0756 2 mL Given   0812 10 mL Given   0826 2 mL Given    Radial Cocktail/Verapamil only (mL) Total volume:  10 mL  Date/Time Rate/Dose/Volume Action   04/21/21 0826 10 mL Given    heparin sodium (porcine) injection (  Units) Total dose:  4,000 Units  Date/Time Rate/Dose/Volume Action   04/21/21 0829 4,000 Units Given    iohexol (OMNIPAQUE) 350 MG/ML injection (mL) Total volume:  35 mL  Date/Time Rate/Dose/Volume Action   04/21/21 0850 35 mL Given     Sedation Time  Sedation Time Physician-1: 58 minutes 58 seconds   Contrast  Medication Name Total Dose  iohexol (OMNIPAQUE) 350 MG/ML injection 35 mL    Radiation/Fluoro  Fluoro time: 7.2 (min) DAP: 21519 (mGycm2) Cumulative Air Kerma: 644 (mGy)   Complications   Complications documented before study signed (04/21/2021 0:34 AM)    No complications were associated with this study.  Documented by Nelva Bush, MD - 04/21/2021 9:24 AM     Coronary Findings   Diagnostic Dominance: Left  Left Main  Vessel is large.  Mid LM to Ost LAD lesion is 10% stenosed. The lesion is severely calcified. The lesion was previously treated using a drug eluting stent over 2 years ago.   Left Anterior Descending  Prox LAD lesion is 40% stenosed. The lesion is mildly calcified.  First Diagonal Branch  Vessel is moderate in size. Vessel is angiographically normal.  Second Diagonal Branch  Vessel is large in size.  Ramus Intermedius  Vessel is large. There is mild focal disease in the vessel. The vessel exhibits minimal luminal irregularities. The vessel is moderately tortuous.  Ost Ramus lesion is 25% stenosed.  Left Circumflex  Vessel is moderate in size. There is mild diffuse disease throughout the vessel.  Prox Cx lesion is 40% stenosed.  First Left Posterolateral Branch  Vessel is moderate in size. There is mild disease in the vessel.  Left Posterior Atrioventricular Artery  Vessel is small in size.  Right Coronary Artery  Vessel is small.  Prox RCA to Mid RCA lesion is 25% stenosed.   Intervention   No interventions have been documented.  Right Heart  Right Heart Pressures RA (mean): 8 mmHg RV (S/EDP): 38/8 mmHg PA (S/D, mean): 41/16 (26) mmHg PCWP (mean): 13 mmHg  Ao sat: 99% PA sat: 81%  Fick CO: 8.1 L/min Fick CI: 4.3 L/min/m^2   Left Heart  Left Ventricle LV end diastolic pressure is normal. LVEDP 13 mmHg.  Mitral Valve There is no mitral valve stenosis. Mean gradient 4 mmHg.  Aortic Valve There is no aortic valve stenosis.   Coronary Diagrams   Diagnostic Dominance: Left         Impression:  Patient has chronic symptoms of exertional shortness of breath and orthopnea that seem to wax and wane in severity and are clearly multifactorial but are nonetheless consistent with chronic combined systolic and diastolic congestive heart failure, [place] Heart Association functional class II-III.  Overall the patient states that she has been relatively stable and she currently feels better than she felt 5 years ago.  Nonetheless she is somewhat limited and occasionally her symptoms get more severe.  Her chronic heart failure is clearly  related to a combination of ischemic cardiomyopathy, hypertensive heart disease, mitral regurgitation and obesity with physical deconditioning.  I have personally reviewed the patient's recent transthoracic and transesophageal echocardiograms as well as her diagnostic cardiac catheterization.  Echocardiograms reveal moderate to severe left ventricular systolic dysfunction with ejection fraction is estimated ranging from 35 to 50%.  There is global hypokinesis.  There is significant diastolic dysfunction.  Right ventricular size and systolic function appear normal.  There is moderate to severe mitral regurgitation.  The mitral valve annulus appears somewhat dilated  but the jet of regurgitation is very eccentric and primarily emanates from what appears to be a small prolapsing segment of the P3 scallop of the posterior leaflet.  There is no flow reversal in the pulmonary veins and efforts to quantify the severity of mitral regurgitation using PISA revealed an ERO measured 0.17 cm with regurgitant volume 30 mL, consistent with moderate to severe mitral regurgitation.  There is moderate left atrial enlargement.  No other significant abnormalities were noted.  Diagnostic cardiac catheterization is notable for continued patency of the stents in the left main, left anterior descending coronary artery and left circumflex coronary artery.  There has not been any significant progression of coronary artery disease since the patient's previous catheterization in 2019.  Pulmonary artery pressures were very mildly elevated (41/16 mmHg) with mean pulmonary capillary wedge pressure 13 mmHg.  There were no large V waves on wedge tracing.  I agree the patient has stable but significant symptoms of chronic combined systolic and diastolic congestive heart failure and she appears to have moderate to severe primary mitral regurgitation related to small prolapsing segment of the P3 scallop of the posterior leaflet.  However, by all  criteria the mitral regurgitation is not severe.  Her symptoms are unquestionably multifactorial.  Options include continued medical therapy versus mitral valve repair.  Based upon review of the patient's transesophageal echocardiogram I feel that her valve could be repaired but risks associated with conventional surgery would be elevated because of the patient's degree of left ventricular systolic dysfunction, morbid obesity, and numerous comorbid medical problems.  Risks are not likely prohibitive although transcatheter edge-to-edge repair could be considered as an alternative.   Plan:  The patient and her sister were counseled at length regarding her diagnosis of moderate-severe primary mitral regurgitation.  We reviewed the results of her diagnostic tests including images from the most recent echocardiogram.  We discussed the natural history of mitral regurgitation as well as alternative treatment strategies.  We discussed the impact of her age, current state of health, and all significant comorbid medical problems on clinical decision making.  We went on to discuss the indications, risks and potential benefits of mitral valve repair as well as the timing of surgical intervention.  Because her mitral regurgitation may not be severe I am reluctant to recommend surgical intervention at this time.  However, as the next step the patient's recent TEE will be reviewed by our multidisciplinary heart valve team to discuss treatment options further.  The patient understands that I will be leaving [place] permanently in the near future.  If surgery is to be recommended she can be referred to one of my partners or alternatively to [facility] for possible robotic mitral valve repair.  All of her questions have been answered.     I spent in excess of 90 minutes during the conduct of this office consultation and >50% of this time involved direct face-to-face encounter with the patient for  counseling and/or coordination of their care.    Valentina Gu. Roxy Manns, MD 04/23/2021 4:12 PM    Addendum:  I spoke with the patient over the telephone this afternoon and explained to her the fact that our multidisciplinary heart valve team met this morning and carefully reviewed the results of her recent transesophageal echocardiogram.  It was the consensus of everyone on the team that the patient's recent transesophageal echocardiogram does not reveal findings consistent with severe mitral regurgitation.  Under the circumstances we would not recommend that the  patient undergo any type of surgical procedure at this time.  We would recommend that the patient continue to follow-up closely with Dr. Oval Linsey on medical therapy.  All of her questions have been answered.   Rexene Alberts, MD 04/29/2021 12:51 PM

## 2021-04-23 NOTE — Patient Instructions (Signed)
Continue all previous medications without any changes at this time  

## 2021-06-19 ENCOUNTER — Encounter (HOSPITAL_BASED_OUTPATIENT_CLINIC_OR_DEPARTMENT_OTHER): Payer: Self-pay | Admitting: *Deleted

## 2021-06-19 ENCOUNTER — Emergency Department (HOSPITAL_BASED_OUTPATIENT_CLINIC_OR_DEPARTMENT_OTHER)
Admission: EM | Admit: 2021-06-19 | Discharge: 2021-06-19 | Disposition: A | Discharge status: Home / Self Care | Payer: Self-pay | Attending: Emergency Medicine | Admitting: Emergency Medicine

## 2021-06-19 ENCOUNTER — Other Ambulatory Visit: Payer: Self-pay

## 2021-06-19 DIAGNOSIS — I2510 Atherosclerotic heart disease of native coronary artery without angina pectoris: Secondary | ICD-10-CM | POA: Insufficient documentation

## 2021-06-19 DIAGNOSIS — M25511 Pain in right shoulder: Secondary | ICD-10-CM | POA: Insufficient documentation

## 2021-06-19 DIAGNOSIS — E119 Type 2 diabetes mellitus without complications: Secondary | ICD-10-CM | POA: Insufficient documentation

## 2021-06-19 DIAGNOSIS — R519 Headache, unspecified: Secondary | ICD-10-CM | POA: Insufficient documentation

## 2021-06-19 DIAGNOSIS — M7918 Myalgia, other site: Secondary | ICD-10-CM

## 2021-06-19 DIAGNOSIS — Z7984 Long term (current) use of oral hypoglycemic drugs: Secondary | ICD-10-CM | POA: Insufficient documentation

## 2021-06-19 DIAGNOSIS — Z7982 Long term (current) use of aspirin: Secondary | ICD-10-CM | POA: Insufficient documentation

## 2021-06-19 DIAGNOSIS — I110 Hypertensive heart disease with heart failure: Secondary | ICD-10-CM | POA: Insufficient documentation

## 2021-06-19 DIAGNOSIS — I5042 Chronic combined systolic (congestive) and diastolic (congestive) heart failure: Secondary | ICD-10-CM | POA: Insufficient documentation

## 2021-06-19 DIAGNOSIS — Z79899 Other long term (current) drug therapy: Secondary | ICD-10-CM | POA: Insufficient documentation

## 2021-06-19 DIAGNOSIS — Z853 Personal history of malignant neoplasm of breast: Secondary | ICD-10-CM | POA: Insufficient documentation

## 2021-06-19 MED ORDER — DICLOFENAC SODIUM 1 % EX GEL: 2.0000 g | Freq: Four times a day (QID) | CUTANEOUS | 0 refills | Status: DC

## 2021-06-19 MED ORDER — LIDOCAINE 5 % EX PTCH
1.0000 | MEDICATED_PATCH | CUTANEOUS | Status: DC
  Administered 2021-06-19: 1 via TRANSDERMAL
  Filled 2021-06-19: qty 1

## 2021-06-19 MED ORDER — LIDOCAINE 5 % EX PTCH: 1.0000 | MEDICATED_PATCH | CUTANEOUS | 0 refills | Status: DC
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
## 2021-06-19 NOTE — Discharge Instructions (Addendum)
Warm compresses for 20 minutes at a time followed by gentle stretching.  Apply Lidoderm and diclofenac topically as needed as prescribed.  Follow-up with your doctor on Monday if not improving.
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
## 2021-06-23 ENCOUNTER — Telehealth: Payer: Self-pay

## 2021-06-23 NOTE — Telephone Encounter (Signed)
Call placed to patient to inquire about participating in PREP in the August class starting 07/14/21 230p-345p M/W x 12wks Will need to check calendar to see if that time would work. Closest to [place]. May need an earlier time. Will let me know. Confirmed has my number for call back.

## 2021-07-04 ENCOUNTER — Telehealth: Payer: Self-pay

## 2021-07-04 NOTE — Telephone Encounter (Signed)
Call placed to schedule intake for PREP starting 07/14/21 Intake scheduled for 8/11 at 11am at [facility]

## 2021-07-10 NOTE — Progress Notes (Signed)
[facility] Report   Patient Details  Name: Reika Prose MRN: [id number] Date of Birth: 1956-11-30 Age: 65 y.o. PCP: Forrest Moron, MD  Vitals:   07/10/21 1501  BP: 132/70  Pulse: 71  SpO2: 98%  Weight: 192 lb 9.6 oz (87.4 kg)      [facility] Eval - 07/10/21 1500       Referral    Referring Provider Oval Linsey    Reason for referral Diabetes;Hypertension;High Cholesterol;Inactivity;Obesitity/Overweight    Program Start Date 07/21/21   M/W at 10am-at [facility]     Measurement   Waist Circumference 45 inches    Hip Circumference 50 inches      Information for Trainer   Goals Establish exercise routine, needs strength training    Current Exercise none    Orthopedic Concerns Right Hip pain (resolved) Bil knee pain when standing up    Pertinent Medical History HTN, s/p MI stent, NIDDM2, DVT, breast cancer    Current Barriers --   Needs am class instead of PM   Restrictions/Precautions Diabetic snack before exercise    Medications that affect exercise Medication causing dizziness/drowsiness;Beta blocker;Asthma inhaler      Timed Up and Go (TUGS)   Timed Up and Go Low risk <9 seconds      Mobility and Daily Activities   I find it easy to walk up or down two or more flights of stairs. 2    I have no trouble taking out the trash. 1    I do housework such as vacuuming and dusting on my own without difficulty. 2    I can easily lift a gallon of milk (8lbs). 2    I can easily walk a mile. 1    I have no trouble reaching into high cupboards or reaching down to pick up something from the floor. 1    I do not have trouble doing out-door work such as Armed forces logistics/support/administrative officer, raking leaves, or gardening. 1      Mobility and Daily Activities   I feel younger than my age. 2    I feel independent. 4    I feel energetic. 2    I live an active life.  2    I feel strong. 2    I feel healthy. 2    I feel active as other people my age. 2      How fit and strong are you.    Fit and Strong Total Score 26            Past Medical History:  Diagnosis Date   Breast cancer ([place])    Cancer ([place])    Chronic combined systolic and diastolic heart failure ([place]) 01/30/2015   Coronary artery disease    Depression    DVT (deep venous thrombosis) ([place]) 11/15   left femerol artery injury during resusciation.   GERD (gastroesophageal reflux disease)    HCAP (healthcare-associated pneumonia) 11/22/2014   Heart murmur    High cholesterol    History of blood transfusion 09/2014   related to MI   History of gout    Hypertension    Liver laceration 10/19/14   Palpitations 12/12/2020   Pulmonary edema 10/19/14   Renal disorder    Right hip pain 04/03/2021   Sleep apnea    "suppose to wear mask but I don't" (11/22/2014)   STEMI (ST elevation myocardial infarction) ([place]) 10/19/14   Type II diabetes mellitus (HCC)    Urticaria  Past Surgical History:  Procedure Laterality Date   ABDOMINAL HYSTERECTOMY  ~ [id number]   APPLICATION OF WOUND VAC  10/2014   "over my naval" (11/22/2014)   BREAST LUMPECTOMY WITH AXILLARY LYMPH NODE BIOPSY     BREAST SURGERY     CESAREAN SECTION  1980; [place] WITH STENT PLACEMENT  09/2014   "multiple"/notes 11/22/2014   EXTRACORPOREAL CIRCULATION     Femerol artery repair Right 09/2014   pt insists it was a right femoral artery repair on 11/22/2014   LAPAROSCOPIC ABDOMINAL EXPLORATION     LEFT HEART CATH AND CORONARY ANGIOGRAPHY N/A 12/07/2017   Procedure: LEFT HEART CATH AND CORONARY ANGIOGRAPHY;  Surgeon: Leonie Man, MD;  Location: [facility];  Service: Cardiovascular;  Laterality: N/A;   LEFT VENTRICULAR ASSIST DEVICE     pt is not aware of this hx on 11/22/2014   Liver laceration repair  09/2014   related to CPR/notes 11/22/2014   RIGHT/LEFT HEART CATH AND CORONARY ANGIOGRAPHY N/A 04/21/2021   Procedure: RIGHT/LEFT HEART CATH AND CORONARY ANGIOGRAPHY;  Surgeon: Nelva Bush, MD;  Location: [facility];  Service: Cardiovascular;  Laterality: N/A;   TEE WITHOUT CARDIOVERSION N/A 03/13/2021   Procedure: TRANSESOPHAGEAL ECHOCARDIOGRAM (TEE);  Surgeon: [name], MD;  Location: [facility];  Service: Cardiology;  Laterality: N/A;   TRACHEOSTOMY  09/2014   "closed on it's own when they took it out"   [place]   Social History   Tobacco Use  Smoking Status Never  Smokeless Tobacco Never    Given address to [facility] and coach's phone number    Barnett Hatter 07/10/2021, 3:07 PM

## 2021-07-17 ENCOUNTER — Ambulatory Visit (HOSPITAL_BASED_OUTPATIENT_CLINIC_OR_DEPARTMENT_OTHER): Payer: Medicaid Other | Admitting: Cardiovascular Disease

## 2021-07-28 NOTE — Progress Notes (Signed)
[facility] PREP Weekly Session   Patient Details  Name: Marie Tanner MRN: [id number] Date of Birth: Feb 28, 1956 Age: 65 y.o. PCP: Forrest Moron, MD  There were no vitals filed for this visit.   [facility] Weekly seesion - 07/28/21 1300       Weekly Session   Topic Discussed Importance of resistance training;Other ways to be active;Water    Minutes exercised this week 1 minutes    Classes attended to date 1           Healthy habits: cardio: 150 min/wk by end of 12 weeks; strength training: 2-3 times a week, 20-40 minutes each session by end of 12 wks; water: 64 oz a day, working up to 1/2 body wt in ounces by end of 12 wks.   Paulo Keimig B Daivd Fredericksen 07/28/2021, 1:19 PM

## 2021-07-30 NOTE — Progress Notes (Signed)
[facility] Report   Patient Details  Name: Marie Tanner MRN: [id number] Date of Birth: 16-Nov-1956 Age: 65 y.o. PCP: Forrest Moron, MD  Vitals:   07/30/21 1208  Weight: 191 lb 9.6 oz (86.9 kg)      [facility] Eval - 07/30/21 1200       Measurement   Body fat 43.7 percent            Past Medical History:  Diagnosis Date   Breast cancer ([place])    Cancer ([place])    Chronic combined systolic and diastolic heart failure ([place]) 01/30/2015   Coronary artery disease    Depression    DVT (deep venous thrombosis) ([place]) 11/15   left femerol artery injury during resusciation.   GERD (gastroesophageal reflux disease)    HCAP (healthcare-associated pneumonia) 11/22/2014   Heart murmur    High cholesterol    History of blood transfusion 09/2014   related to MI   History of gout    Hypertension    Liver laceration 10/19/14   Palpitations 12/12/2020   Pulmonary edema 10/19/14   Renal disorder    Right hip pain 04/03/2021   Sleep apnea    "suppose to wear mask but I don't" (11/22/2014)   STEMI (ST elevation myocardial infarction) ([place]) 10/19/14   Type II diabetes mellitus ([place])    Urticaria    Past Surgical History:  Procedure Laterality Date   ABDOMINAL HYSTERECTOMY  ~ [id number]   APPLICATION OF WOUND VAC  10/2014   "over my naval" (11/22/2014)   BREAST LUMPECTOMY WITH AXILLARY LYMPH NODE BIOPSY     BREAST SURGERY     CESAREAN SECTION  1980; [place]  09/2014   "multiple"/notes 11/22/2014   EXTRACORPOREAL CIRCULATION     Femerol artery repair Right 09/2014   pt insists it was a right femoral artery repair on 11/22/2014   LAPAROSCOPIC ABDOMINAL EXPLORATION     LEFT HEART CATH AND CORONARY ANGIOGRAPHY N/A 12/07/2017   Procedure: LEFT HEART CATH AND CORONARY ANGIOGRAPHY;  Surgeon: Leonie Man, MD;  Location: [facility];  Service: Cardiovascular;  Laterality: N/A;   LEFT VENTRICULAR ASSIST DEVICE     pt is not  aware of this hx on 11/22/2014   Liver laceration repair  09/2014   related to CPR/notes 11/22/2014   RIGHT/LEFT HEART CATH AND CORONARY ANGIOGRAPHY N/A 04/21/2021   Procedure: RIGHT/LEFT HEART CATH AND CORONARY ANGIOGRAPHY;  Surgeon: Nelva Bush, MD;  Location: [facility];  Service: Cardiovascular;  Laterality: N/A;   TEE WITHOUT CARDIOVERSION N/A 03/13/2021   Procedure: TRANSESOPHAGEAL ECHOCARDIOGRAM (TEE);  Surgeon: [name], MD;  Location: [facility];  Service: Cardiology;  Laterality: N/A;   TRACHEOSTOMY  09/2014   "closed on it's own when they took it out"   [place]   Social History   Tobacco Use  Smoking Status Never  Smokeless Tobacco Never   Completed Fit testing today.

## 2021-08-31 ENCOUNTER — Emergency Department (HOSPITAL_BASED_OUTPATIENT_CLINIC_OR_DEPARTMENT_OTHER)
Admission: EM | Admit: 2021-08-31 | Discharge: 2021-08-31 | Disposition: A | Discharge status: Home / Self Care | Payer: Self-pay | Attending: Emergency Medicine | Admitting: Emergency Medicine

## 2021-08-31 ENCOUNTER — Emergency Department (HOSPITAL_BASED_OUTPATIENT_CLINIC_OR_DEPARTMENT_OTHER): Payer: Self-pay

## 2021-08-31 ENCOUNTER — Encounter (HOSPITAL_BASED_OUTPATIENT_CLINIC_OR_DEPARTMENT_OTHER): Payer: Self-pay | Admitting: Emergency Medicine

## 2021-08-31 ENCOUNTER — Other Ambulatory Visit: Payer: Self-pay

## 2021-08-31 DIAGNOSIS — Z853 Personal history of malignant neoplasm of breast: Secondary | ICD-10-CM | POA: Insufficient documentation

## 2021-08-31 DIAGNOSIS — Z7984 Long term (current) use of oral hypoglycemic drugs: Secondary | ICD-10-CM | POA: Insufficient documentation

## 2021-08-31 DIAGNOSIS — I110 Hypertensive heart disease with heart failure: Secondary | ICD-10-CM | POA: Insufficient documentation

## 2021-08-31 DIAGNOSIS — Z7982 Long term (current) use of aspirin: Secondary | ICD-10-CM | POA: Insufficient documentation

## 2021-08-31 DIAGNOSIS — E119 Type 2 diabetes mellitus without complications: Secondary | ICD-10-CM | POA: Insufficient documentation

## 2021-08-31 DIAGNOSIS — I5042 Chronic combined systolic (congestive) and diastolic (congestive) heart failure: Secondary | ICD-10-CM | POA: Insufficient documentation

## 2021-08-31 DIAGNOSIS — Z20822 Contact with and (suspected) exposure to covid-19: Secondary | ICD-10-CM | POA: Insufficient documentation

## 2021-08-31 DIAGNOSIS — I2510 Atherosclerotic heart disease of native coronary artery without angina pectoris: Secondary | ICD-10-CM | POA: Insufficient documentation

## 2021-08-31 DIAGNOSIS — R079 Chest pain, unspecified: Secondary | ICD-10-CM

## 2021-08-31 DIAGNOSIS — R059 Cough, unspecified: Secondary | ICD-10-CM | POA: Insufficient documentation

## 2021-08-31 DIAGNOSIS — R0602 Shortness of breath: Secondary | ICD-10-CM | POA: Insufficient documentation

## 2021-08-31 DIAGNOSIS — R0789 Other chest pain: Secondary | ICD-10-CM | POA: Insufficient documentation

## 2021-08-31 LAB — TROPONIN I (HIGH SENSITIVITY)
Troponin I (High Sensitivity): 16 ng/L (ref <18)
Troponin I (High Sensitivity): 16 ng/L (ref <18)

## 2021-08-31 LAB — CBC
HCT: 37.8 % (ref 36.0–46.0)
Hemoglobin: 12.6 g/dL (ref 12.0–15.0)
MCH: 27.6 pg (ref 26.0–34.0)
MCHC: 33.3 g/dL (ref 30.0–36.0)
MCV: 82.7 fL (ref 80.0–100.0)
Platelets: 294 10*3/uL (ref 150–400)
RBC: 4.57 MIL/uL (ref 3.87–5.11)
RDW: 13.6 % (ref 11.5–15.5)
WBC: 3.8 10*3/uL — ABNORMAL LOW (ref 4.0–10.5)
nRBC: 0 % (ref 0.0–0.2)

## 2021-08-31 LAB — BASIC METABOLIC PANEL
Anion gap: 6 (ref 5–15)
BUN: 9 mg/dL (ref 8–23)
CO2: 25 mmol/L (ref 22–32)
Calcium: 9.2 mg/dL (ref 8.9–10.3)
Chloride: 105 mmol/L (ref 98–111)
Creatinine, Ser: 0.76 mg/dL (ref 0.44–1.00)
GFR, Estimated: >60 mL/min (ref >60)
Glucose, Bld: 88 mg/dL (ref 70–99)
Potassium: 4.2 mmol/L (ref 3.5–5.1)
Sodium: 136 mmol/L (ref 135–145)

## 2021-08-31 LAB — RESP PANEL BY RT-PCR (FLU A&B, COVID) ARPGX2
Influenza A by PCR: NEGATIVE
Influenza B by PCR: NEGATIVE
SARS Coronavirus 2 by RT PCR: NEGATIVE

## 2021-08-31 LAB — HEPATIC FUNCTION PANEL
ALT: 18 U/L (ref 0–44)
AST: 25 U/L (ref 15–41)
Albumin: 4 g/dL (ref 3.5–5.0)
Alkaline Phosphatase: 63 U/L (ref 38–126)
Bilirubin, Direct: 0.1 mg/dL (ref 0.0–0.2)
Indirect Bilirubin: 0.7 mg/dL (ref 0.3–0.9)
Total Bilirubin: 0.8 mg/dL (ref 0.3–1.2)
Total Protein: 9 g/dL — ABNORMAL HIGH (ref 6.5–8.1)

## 2021-08-31 LAB — LIPASE, BLOOD: Lipase: 40 U/L (ref 11–51)

## 2021-08-31 MED ORDER — CYCLOBENZAPRINE HCL 10 MG PO TABS: 10.0000 mg | ORAL_TABLET | Freq: Two times a day (BID) | ORAL | 0 refills | Status: DC | PRN

## 2021-08-31 MED ORDER — LIDOCAINE 5 % EX PTCH: 1.0000 | MEDICATED_PATCH | CUTANEOUS | 0 refills | Status: DC

## 2021-08-31 MED ORDER — FENTANYL CITRATE PF 50 MCG/ML IJ SOSY
50.0000 ug | PREFILLED_SYRINGE | Freq: Once | INTRAMUSCULAR | Status: AC
Start: 2021-08-31 — End: 2021-08-31
  Administered 2021-08-31: 50 ug via INTRAVENOUS
  Filled 2021-08-31: qty 1

## 2021-08-31 MED ORDER — IOHEXOL 350 MG/ML SOLN
100.0000 mL | Freq: Once | INTRAVENOUS | Status: AC | PRN
  Administered 2021-08-31: 74 mL via INTRAVENOUS

## 2021-08-31 NOTE — ED Notes (Signed)
Patient transported to CT 

## 2021-08-31 NOTE — Discharge Instructions (Signed)
Your history, exam, work-up today were overall reassuring.  The CT scan did not show any evidence of blood clots in your lungs and it only showed the changes likely related to your previous radiation.  Your heart enzymes were negative both times we checked them as well as your lactic acid.  You are not septic.  We did not see convincing evidence of pneumonia and your COVID and flu test were negative.  Given the over 4 hours of monitoring with stability, we feel you are safe for discharge home given the improvement in your symptoms.  As we suspect the symptoms are related to your chest wall, please use the patches and muscle medicine to help with the discomfort.  Please follow-up with your primary care physician and if any symptoms change or worsen, please return to the nearest emergency room.
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
## 2021-08-31 NOTE — ED Provider Notes (Signed)
[facility] EMERGENCY DEPARTMENT Provider Note   CSN: [id number] Arrival date & time: 08/31/21  1038     History Chief Complaint  Patient presents with   Chest Pain    Marie Tanner is a 65 y.o. female.  The history is provided by the patient and medical records. No language interpreter was used.  Chest Pain Pain location:  R chest and R lateral chest Pain quality: aching, crushing and pressure   Pain radiates to:  Does not radiate Pain severity:  Severe Onset quality:  Gradual Duration:  3 days Timing:  Constant Progression:  Waxing and waning Chronicity:  New Relieved by:  Nothing Worsened by:  Deep breathing and coughing Ineffective treatments:  None tried Associated symptoms: cough and shortness of breath   Associated symptoms: no abdominal pain, no altered mental status, no back pain, no diaphoresis, no fatigue, no fever, no headache, no lower extremity edema, no nausea, no numbness, no palpitations, no syncope, no vomiting and no weakness   Risk factors: coronary artery disease, diabetes mellitus, high cholesterol, hypertension and prior DVT/PE   Risk factors: not female       Past Medical History:  Diagnosis Date   Breast cancer ([place])    Cancer ([place])    Chronic combined systolic and diastolic heart failure ([place]) 01/30/2015   Coronary artery disease    Depression    DVT (deep venous thrombosis) ([place]) 11/15   left femerol artery injury during resusciation.   GERD (gastroesophageal reflux disease)    HCAP (healthcare-associated pneumonia) 11/22/2014   Heart murmur    High cholesterol    History of blood transfusion 09/2014   related to MI   History of gout    Hypertension    Liver laceration 10/19/14   Palpitations 12/12/2020   Pulmonary edema 10/19/14   Renal disorder    Right hip pain 04/03/2021   Sleep apnea    "suppose to wear mask but I don't" (11/22/2014)   STEMI (ST elevation myocardial infarction) ([place]) 10/19/14   Type II diabetes  mellitus ([place])    Urticaria     Patient Active Problem List   Diagnosis Date Noted   Right hip pain 04/03/2021   Palpitations 12/12/2020   Allergic urticaria 10/31/2019   Dermographia 10/31/2019   Other allergic rhinitis 10/31/2019   Current use of beta blocker 10/31/2019   Allergy to insect bites 10/31/2019   Hx of colonic polyp 01/03/2018   Angina, class III ([place]) 12/07/2017   Abnormal nuclear stress test 12/07/2017   Type II diabetes mellitus ([place])    Sleep apnea    Renal disorder    History of gout    Heart murmur    GERD (gastroesophageal reflux disease)    CAD S/P percutaneous coronary angioplasty    Cancer ([place])    Breast cancer ([place])    Hyperlipidemia LDL goal <70 11/16/2016   History of breast cancer 01/24/2016   Abnormal finding on mammography 01/17/2016   Chest pain 06/06/2015   Diarrhea 06/06/2015   Essential hypertension 06/06/2015   Depression    Encounter for therapeutic drug monitoring 02/19/2015   Mitral regurgitation 01/30/2015   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[place]) 01/30/2015   Chronic combined systolic and diastolic heart failure ([place]) 01/30/2015   Dyspnea 11/24/2014   Cardiomyopathy ([place]) 11/24/2014   DVT (deep venous thrombosis) ([place]) 11/24/2014   DM type 2 (diabetes mellitus, type 2) ([place]) 11/24/2014   HCAP (healthcare-associated pneumonia) 11/22/2014   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[place]) 11/04/2014   Controlled type 2 diabetes mellitus without complication ([place]) 60/45/4098   High sodium levels 10/29/2014   Elevated WBC count 10/29/2014   Acute confusion due to medical condition 10/27/2014   Insomnia due to medical condition 10/27/2014   Acute respiratory failure ([place]) 10/24/2014   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[place]) 10/24/2014   Common femoral artery injury 10/21/2014   Injury of kidney 10/20/2014   Laceration of liver 10/20/2014   STEMI (ST elevation myocardial infarction) ([place]) 10/19/2014   Pulmonary  edema 10/19/2014   Liver laceration 10/19/2014   History of blood transfusion 09/30/2014   Diabetes mellitus ([place]) 04/19/2013    Past Surgical History:  Procedure Laterality Date   ABDOMINAL HYSTERECTOMY  ~ 1191   APPLICATION OF WOUND VAC  10/2014   "over my naval" (11/22/2014)   BREAST LUMPECTOMY WITH AXILLARY LYMPH NODE BIOPSY     BREAST SURGERY     CESAREAN SECTION  1980; [place]  09/2014   "multiple"/notes 11/22/2014   EXTRACORPOREAL CIRCULATION     Femerol artery repair Right 09/2014   pt insists it was a right femoral artery repair on 11/22/2014   LAPAROSCOPIC ABDOMINAL EXPLORATION     LEFT HEART CATH AND CORONARY ANGIOGRAPHY N/A 12/07/2017   Procedure: LEFT HEART CATH AND CORONARY ANGIOGRAPHY;  Surgeon: Leonie Man, MD;  Location: [facility];  Service: Cardiovascular;  Laterality: N/A;   LEFT VENTRICULAR ASSIST DEVICE     pt is not aware of this hx on 11/22/2014   Liver laceration repair  09/2014   related to CPR/notes 11/22/2014   RIGHT/LEFT HEART CATH AND CORONARY ANGIOGRAPHY N/A 04/21/2021   Procedure: RIGHT/LEFT HEART CATH AND CORONARY ANGIOGRAPHY;  Surgeon: Nelva Bush, MD;  Location: [facility];  Service: Cardiovascular;  Laterality: N/A;   TEE WITHOUT CARDIOVERSION N/A 03/13/2021   Procedure: TRANSESOPHAGEAL ECHOCARDIOGRAM (TEE);  Surgeon: [name], MD;  Location: [facility];  Service: Cardiology;  Laterality: N/A;   TRACHEOSTOMY  09/2014   "closed on it's own when they took it out"   [place]     OB History     Gravida  2   Para  2   Term      Preterm      AB      Living         SAB      IAB      Ectopic      Multiple      Live Births              Family History  Problem Relation Age of Onset   Diabetes Mother    Hypertension Mother    COPD Mother    Heart attack Father    Alzheimer's disease Father    Diabetes Brother    Asthma Daughter     Stroke Neg Hx    Colon cancer Neg Hx    Stomach cancer Neg Hx    Pancreatic cancer Neg Hx    AAA (abdominal aortic aneurysm) Neg Hx     Social History   Tobacco Use   Smoking status: Never   Smokeless tobacco: Never  Vaping Use   Vaping Use: Never used  Substance Use Topics   Alcohol use: Yes    Comment: occ   Drug use: No    Home Medications Prior to Admission medications   Medication Sig Start Date End  Date Taking? Authorizing Provider  acetaminophen (TYLENOL) 500 MG tablet Take 1,000 mg by mouth every 6 (six) hours as needed for moderate pain.    [provider]  Alirocumab (PRALUENT) 150 MG/ML SOAJ Inject 150 mg into the skin every 14 (fourteen) days. 07/16/20   Skeet Latch, MD  aspirin EC 81 MG tablet Take 1 tablet (81 mg total) by mouth daily. 04/08/15   Darlin Coco, MD  blood glucose meter kit and supplies KIT by Does not apply route daily as needed. Dispense based on patient and insurance preference. Use up to four times daily as directed. (FOR ICD-9 250.00, 250.01).    [provider]  BRILINTA 60 MG TABS tablet TAKE 1 TABLET (60 MG TOTAL) BY MOUTH 2 (TWO) TIMES DAILY. Patient taking differently: Take 60 mg by mouth 2 (two) times daily. 03/11/20   Skeet Latch, MD  cholecalciferol (VITAMIN D3) 25 MCG (1000 UNIT) tablet Take 1,000 Units by mouth daily.    [provider]  diclofenac Sodium (VOLTAREN) 1 % GEL Apply 2 g topically 4 (four) times daily. 06/19/21   Tacy Learn, PA-C  exemestane (AROMASIN) 25 MG tablet Take 25 mg by mouth daily after breakfast.  07/25/19   [provider]  fluticasone (FLONASE) 50 MCG/ACT nasal spray 2 sprays per nostril at night for stuffy nose at 7pm Patient taking differently: Place 2 sprays into both nostrils daily. at 7pm 09/19/19   Bardelas, Jens Som, MD  furosemide (LASIX) 40 MG tablet TAKE 1 TABLET AS NEEDED FOR SHORTNESS OF BREATH OR WEIGHT GAIN OF 2 POUNDS IN 24 HOURS OR 5 POUNDS IN 1  WEEK Patient taking differently: Take 40 mg by mouth daily as needed (SHORTNESS OF BREATH OR WEIGHT GAIN OF 2 POUNDS IN 24 HOURS OR 5 POUNDS IN 1 WEEK). 02/28/20   Skeet Latch, MD  glucose blood test strip Use as instructed. E11.5. To check blood glucose daily. 05/02/18   Forrest Moron, MD  isosorbide mononitrate (IMDUR) 30 MG 24 hr tablet Take 1 tablet (30 mg total) by mouth daily. 11/28/20 02/26/21  Skeet Latch, MD  lidocaine (LIDODERM) 5 % Place 1 patch onto the skin daily. Remove & Discard patch within 12 hours or as directed by MD 06/19/21   Suella Broad A, PA-C  magnesium oxide (MAG-OX) 400 MG tablet TAKE 1 TABLET BY MOUTH EVERY DAY Patient taking differently: Take 400 mg by mouth daily. 04/04/20   Forrest Moron, MD  metFORMIN (GLUCOPHAGE-XR) 500 MG 24 hr tablet Take 1 tablet (500 mg total) by mouth daily with breakfast. 04/24/21   End, Harrell Gave, MD  metoprolol succinate (TOPROL-XL) 25 MG 24 hr tablet Take 1 tablet (25 mg total) by mouth daily. 11/28/20   Skeet Latch, MD  nitroGLYCERIN (NITROSTAT) 0.4 MG SL tablet Place 1 tablet (0.4 mg total) under the tongue every 5 (five) minutes as needed for chest pain. max of 3 if no relief call 911 11/16/17   Skeet Latch, MD  rosuvastatin (CRESTOR) 20 MG tablet Take 1 tablet by mouth 3 times per week Patient taking differently: Take 10 mg by mouth every Monday, Wednesday, and Friday. 12/12/20   Skeet Latch, MD  sacubitril-valsartan (ENTRESTO) 49-51 MG Take 1 tablet by mouth 2 (two) times daily. 04/03/21   Skeet Latch, MD  spironolactone (ALDACTONE) 25 MG tablet Take 1 tablet (25 mg total) by mouth daily. 11/28/20   Skeet Latch, MD  triamcinolone cream (KENALOG) 0.1 % Apply 1 application topically 2 (two) times daily.  Patient taking differently: Apply 1 application topically 2 (two) times daily as needed (rash). 03/05/20   Forrest Moron, MD    Allergies    Dilaudid [hydromorphone hcl], Epinephrine, Lactose  intolerance (gi), and Jardiance [empagliflozin]  Review of Systems   Review of Systems  Constitutional:  Positive for chills. Negative for diaphoresis, fatigue and fever.  HENT:  Positive for congestion.   Respiratory:  Positive for cough, chest tightness and shortness of breath. Negative for wheezing.   Cardiovascular:  Positive for chest pain. Negative for palpitations and syncope.  Gastrointestinal:  Negative for abdominal pain, constipation, diarrhea, nausea and vomiting.  Genitourinary:  Negative for dysuria, flank pain and frequency.  Musculoskeletal:  Negative for back pain.  Skin:  Negative for rash and wound.  Neurological:  Negative for weakness, numbness and headaches.  Psychiatric/Behavioral:  Negative for agitation and confusion.    Physical Exam Updated Vital Signs BP (!) 156/94 (BP Location: Left Arm)   Pulse 86   Temp 98.5 F (36.9 C) (Oral)   Resp 18   Ht 5' 2"  (1.575 m)   Wt 86.2 kg   SpO2 99%   BMI 34.75 kg/m   Physical Exam Vitals and nursing note reviewed.  Constitutional:      General: She is not in acute distress.    Appearance: She is well-developed. She is not ill-appearing, toxic-appearing or diaphoretic.  HENT:     Head: Normocephalic and atraumatic.  Eyes:     Conjunctiva/sclera: Conjunctivae normal.  Cardiovascular:     Rate and Rhythm: Regular rhythm. Tachycardia present.     Heart sounds: Normal heart sounds. No murmur heard. Pulmonary:     Effort: Pulmonary effort is normal. No respiratory distress.     Breath sounds: Rhonchi present. No wheezing or rales.  Chest:     Chest wall: No tenderness.  Abdominal:     Palpations: Abdomen is soft.     Tenderness: There is no abdominal tenderness.  Musculoskeletal:     Cervical back: Neck supple.     Right lower leg: No tenderness. No edema.     Left lower leg: No tenderness. No edema.  Skin:    General: Skin is warm and dry.     Capillary Refill: Capillary refill takes less than 2 seconds.   Neurological:     General: No focal deficit present.     Mental Status: She is alert.  Psychiatric:        Mood and Affect: Mood normal.    ED Results / Procedures / Treatments   Labs (all labs ordered are listed, but only abnormal results are displayed) Labs Reviewed  CBC - Abnormal; Notable for the following components:      Result Value   WBC 3.8 (*)    All other components within normal limits  HEPATIC FUNCTION PANEL - Abnormal; Notable for the following components:   Total Protein 9.0 (*)    All other components within normal limits  RESP PANEL BY RT-PCR (FLU A&B, COVID) [id number]  BASIC METABOLIC PANEL  LIPASE, BLOOD  TROPONIN I (HIGH SENSITIVITY)  TROPONIN I (HIGH SENSITIVITY)    EKG EKG Interpretation  Date/Time:  Sunday August 31 2021 10:48:41 EDT Ventricular Rate:  96 PR Interval:  154 QRS Duration: 68 QT Interval:  308 QTC Calculation: 389 R Axis:   69 Text Interpretation: Normal sinus rhythm Nonspecific T wave abnormality Abnormal ECG When compared to prior, similar appearance. No STEMI Confirmed by Antony Blackbird [phone]) on 08/31/2021 12:19:41  PM  Radiology DG Chest 2 View  Result Date: 08/31/2021 CLINICAL DATA:  Cough for a few weeks. Chest pain and shortness of breath. EXAM: CHEST - 2 VIEW COMPARISON:  February 16, 2019 FINDINGS: Opacity persists in the right apex. The heart, hila, mediastinum, lungs, and pleura are otherwise unremarkable. IMPRESSION: Persistent infiltrate in the right apex. Recommend CT imaging. No other abnormalities. Electronically Signed   By: Dorise Bullion III M.D.   On: 08/31/2021 11:31   CT Angio Chest PE W and/or Wo Contrast  Result Date: 08/31/2021 CLINICAL DATA:  Suspected pulmonary embolus. EXAM: CT ANGIOGRAPHY CHEST WITH CONTRAST TECHNIQUE: Multidetector CT imaging of the chest was performed using the standard protocol during bolus administration of intravenous contrast. Multiplanar CT image reconstructions and MIPs were obtained  to evaluate the vascular anatomy. CONTRAST:  40m OMNIPAQUE IOHEXOL 350 MG/ML SOLN COMPARISON:  06/06/2015 FINDINGS: Cardiovascular: Heart size is normal. There is atherosclerotic calcification of coronary arteries. No pericardial effusion. Thoracic aorta is partially calcified but not aneurysmal. Pulmonary arteries are well opacified by contrast bolus. There is no acute pulmonary embolus. Mediastinum/Nodes: The visualized portion of the thyroid gland has a normal appearance. No significant mediastinal, hilar, or axillary adenopathy. Esophagus is unremarkable. Postoperative changes seen in the RIGHT axilla. Lungs/Pleura: Presumed post radiation changes in the anterior RIGHT UPPER lobe, new since prior study. No suspicious pulmonary nodules, consolidation, or pulmonary edema. Airways are patent. Upper Abdomen: Possible layering sludge or stones within the gallbladder. Otherwise UPPER abdomen is unremarkable. Musculoskeletal: Mild degenerative changes in the thoracic spine. No lytic or blastic lesions. Review of the MIP images confirms the above findings. IMPRESSION: 1. Technically adequate exam showing no acute pulmonary embolus. 2. Post radiation changes in the RIGHT UPPER lobe of the lung. Postsurgical changes in the RIGHT axilla. 3. Possible layering sludge or stones within the gallbladder, only partially imaged. Electronically Signed   By: ENolon NationsM.D.   On: 08/31/2021 13:32    Procedures Procedures   Medications Ordered in ED Medications  fentaNYL (SUBLIMAZE) injection 50 mcg (50 mcg Intravenous Given 08/31/21 1306)  iohexol (OMNIPAQUE) 350 MG/ML injection 100 mL (74 mLs Intravenous Contrast Given 08/31/21 1256)    ED Course  I have reviewed the triage vital signs and the nursing notes.  Pertinent labs & imaging results that were available during my care of the patient were reviewed by me and considered in my medical decision making (see chart for details).    MDM Rules/Calculators/A&P                            RGretta Samonsis a 65y.o. female with a past medical history significant for CAD with prior STEMI, CHF, DVT, previous breast cancer, hypercholesterolemia, hypertension, gout, and sleep apnea who presents with several weeks of cough with acutely worsening chest discomfort and shortness of breath since yesterday.  Patient reports that she has had some URI symptoms with cough and congestion for a couple of weeks and she says she has not been tested for COVID or flu.  She says that over the last few days it is acutely worsened with moderate to severe pain in her right chest.  She reports it is very pleuritic and cannot catch her breath.  She reports no diaphoresis, nausea, vomiting, or palpitations.  She reports no new leg pain or leg swelling and does not member if this feels like when she has had clot troubles in the past.  She reports that the discomfort in her chest is an 8 out of 10 in severity and worse with any deep breathing or coughing.  She reports she does have a productive cough but no hemoptysis reported.  She denies trauma.  She reports the pain is not in her back and denies any rashes.  She is concerned about the new chest discomfort and shortness of breath.  On exam, lungs have coarseness diffusely and chest is nontender.  No murmur appreciated.  Abdomen nontender.  Patient moving all extremities.  Legs are nontender and nonedematous.  Patient is tachycardic on my evaluation but is not hypoxic.  Patient is warm to the touch but was afebrile here.  Clinically I am concerned about either pneumonia versus pulmonary embolism given her history of clotting troubles versus other causes of discomfort.  We will get a chest x-ray, labs, troponins, EKG, and other work-up.  Patient's chest x-ray shows Infiltrate in the right apex and CT was recommended.given the concern for possible PE, we will get a CT PE study which will also help delineate pneumonia versus atelectasis versus  other.   Patient was given some pain medicine for discomfort.  Anticipate reassessment after work-up to determine disposition.  Patient CT scan does not show evidence of pneumonia or pulm embolism.  Some radiation changes likely seen.  Other work-up overall reassuring.  Patient was reassessed and symptoms have improved.  She is not hypoxic or tachycardic.  She is feeling better.  Suspect musculoskeletal chest wall discomfort in the setting of some coughing.  Patient is agreeable with discharge home given her improvement in symptoms and she will be given prescription for muscle relaxant and Lidoderm patches.  She will follow-up with her PCP and understood return precautions.  She no questions or concerns and was discharged in good condition   Final Clinical Impression(s) / ED Diagnoses Final diagnoses:  Chest pain, unspecified type    Rx / DC Orders ED Discharge Orders          Ordered    cyclobenzaprine (FLEXERIL) 10 MG tablet  2 times daily PRN        08/31/21 1449    lidocaine (LIDODERM) 5 %  Every 24 hours        08/31/21 1449           Clinical Impression: 1. Chest pain, unspecified type     Disposition: Discharge  Condition: Good  I have discussed the results, Dx and Tx plan with the pt(& family if present). He/she/they expressed understanding and agree(s) with the plan. Discharge instructions discussed at great length. Strict return precautions discussed and pt &/or family have verbalized understanding of the instructions. No further questions at time of discharge.    New Prescriptions   CYCLOBENZAPRINE (FLEXERIL) 10 MG TABLET    Take 1 tablet (10 mg total) by mouth 2 (two) times daily as needed for muscle spasms.   LIDOCAINE (LIDODERM) 5 %    Place 1 patch onto the skin daily. Remove & Discard patch within 12 hours or as directed by MD    Follow Up: your PCP     [facility] [address] [place]  [id number] [phone] Schedule an appointment as soon as possible for a visit    [facility] [address] [id number] MW U[facility]       Charlea Nardo, Gwenyth Allegra, MD 08/31/21 1451

## 2021-09-17 ENCOUNTER — Ambulatory Visit (HOSPITAL_BASED_OUTPATIENT_CLINIC_OR_DEPARTMENT_OTHER): Payer: Medicaid Other | Admitting: Cardiovascular Disease

## 2021-10-15 NOTE — Progress Notes (Signed)
[employer] PREP Weekly Session  Patient Details  Name: Marie Tanner MRN: [id number] Date of Birth: [birth year] Age: 65 y.o. PCP: Pcp, No  There were no vitals filed for this visit.   [facility] Weekly seesion - 10/15/21 0900       [facility] "PREP" Location   [facility] "PREP" Location [facility]      Weekly Session   Topic Discussed --   Did not return for final assessment          Total workouts completed: 2 Total education sessions completed: 1 Last class attended on 9/7  [name] 10/15/2021, 9:45 AM

## 2021-11-06 ENCOUNTER — Other Ambulatory Visit: Payer: Self-pay

## 2021-11-06 ENCOUNTER — Ambulatory Visit (INDEPENDENT_AMBULATORY_CARE_PROVIDER_SITE_OTHER): Payer: Self-pay | Admitting: Cardiovascular Disease

## 2021-11-06 ENCOUNTER — Encounter (HOSPITAL_BASED_OUTPATIENT_CLINIC_OR_DEPARTMENT_OTHER): Payer: Self-pay | Admitting: Cardiovascular Disease

## 2021-11-06 VITALS — BP 130/72 | HR 65 | Ht 62.0 in | Wt 186.5 lb

## 2021-11-06 DIAGNOSIS — I5042 Chronic combined systolic (congestive) and diastolic (congestive) heart failure: Secondary | ICD-10-CM

## 2021-11-06 DIAGNOSIS — I10 Essential (primary) hypertension: Secondary | ICD-10-CM

## 2021-11-06 DIAGNOSIS — Z9861 Coronary angioplasty status: Secondary | ICD-10-CM

## 2021-11-06 DIAGNOSIS — I340 Nonrheumatic mitral (valve) insufficiency: Secondary | ICD-10-CM

## 2021-11-06 DIAGNOSIS — I2510 Atherosclerotic heart disease of native coronary artery without angina pectoris: Secondary | ICD-10-CM

## 2021-11-06 NOTE — Assessment & Plan Note (Signed)
Blood pressures well-controlled on Entresto, spironolactone, and metoprolol.

## 2021-11-06 NOTE — Progress Notes (Addendum)
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 elevated pulmonary pressures.  We will repeat a chest x-ray to make sure there is no sign of abnormality.  #Right hip pain: Check hip x-rays.  She will follow-up with Dr. Nolon Rod about this.  Current medicines are reviewed at length with the patient today.  The patient does not have concerns regarding medicines.  The following changes have been made: none  Labs/ tests ordered today include:   Orders Placed This Encounter  Procedures   ECHOCARDIOGRAM COMPLETE     Disposition:   FU with Jaxon Flatt C. Oval Linsey, MD, [facility] in 6 months.  I,Mathew Stumpf,acting as a Education administrator for Skeet Latch, MD.,have documented all relevant documentation on the behalf of Skeet Latch, MD,as directed by  Skeet Latch, MD while in the presence of Skeet Latch, MD.  I, [place] Oval Linsey, MD have reviewed all documentation for this visit.  The documentation of the exam, diagnosis, procedures, and orders on 11/06/2021 are all accurate and  complete.   Signed, Steadman Prosperi C. Oval Linsey, MD, [facility]  11/06/2021 10:28 AM    [place]

## 2021-11-06 NOTE — Patient Instructions (Addendum)
Medication Instructions:  Your physician recommends that you continue on your current medications as directed. Please refer to the Current Medication list given to you today.   *If you need a refill on your cardiac medications before your next appointment, please call your pharmacy*  Lab Work: NONE  Testing/Procedures: Your physician has requested that you have an echocardiogram. Echocardiography is a painless test that uses sound waves to create images of your heart. It provides your doctor with information about the size and shape of your heart and how well your heart's chambers and valves are working. This procedure takes approximately one hour. There are no restrictions for this procedure.  6 MONTHS PRIOR TO FOLLOW UP VISIT   Follow-Up: At [facility], you and your health needs are our priority.  As part of our continuing mission to provide you with exceptional heart care, we have created designated Provider Care Teams.  These Care Teams include your primary Cardiologist (physician) and Advanced Practice Providers (APPs -  Physician Assistants and Nurse Practitioners) who all work together to provide you with the care you need, when you need it.  We recommend signing up for the patient portal called "MyChart".  Sign up information is provided on this After Visit Summary.  MyChart is used to connect with patients for Virtual Visits (Telemedicine).  Patients are able to view lab/test results, encounter notes, upcoming appointments, etc.  Non-urgent messages can be sent to your provider as well.   To learn more about what you can do with MyChart, go to [email or web address].    Your next appointment:   6 month(s)  The format for your next appointment:   In Person  Provider:   Skeet Latch, MD

## 2021-11-06 NOTE — Assessment & Plan Note (Signed)
Most recent LVEF 35 to 40%.  She is euvolemic and doing well.  Continue metoprolol, Entresto, spironolactone, and Lasix.  Encouraged her to increase her exercise to make sure she maintains 150 minutes weekly.

## 2021-11-06 NOTE — Assessment & Plan Note (Signed)
LAD stent was patent on cath 03/2021.  Continue aspirin, ticagrelor, Praluent, rosuvastatin, and metoprolol.

## 2021-11-06 NOTE — Assessment & Plan Note (Signed)
Moderate to severe.  Clinically she is doing well.  Continue with medical management.   Repeat echo in 6 months.

## 2021-12-01 ENCOUNTER — Encounter (HOSPITAL_BASED_OUTPATIENT_CLINIC_OR_DEPARTMENT_OTHER): Payer: Self-pay | Admitting: Cardiovascular Disease

## 2021-12-02 MED ORDER — METOPROLOL SUCCINATE ER 25 MG PO TB24: 25.0000 mg | ORAL_TABLET | Freq: Every day | ORAL | 3 refills | Status: DC

## 2022-01-08 ENCOUNTER — Ambulatory Visit (INDEPENDENT_AMBULATORY_CARE_PROVIDER_SITE_OTHER): Payer: 59 | Admitting: Podiatrist

## 2022-01-08 ENCOUNTER — Other Ambulatory Visit: Payer: Self-pay

## 2022-01-08 ENCOUNTER — Encounter: Payer: Self-pay | Admitting: Podiatrist

## 2022-01-08 DIAGNOSIS — B351 Tinea unguium: Secondary | ICD-10-CM

## 2022-01-08 DIAGNOSIS — L84 Corns and callosities: Secondary | ICD-10-CM

## 2022-01-08 DIAGNOSIS — B353 Tinea pedis: Secondary | ICD-10-CM | POA: Diagnosis not present

## 2022-01-08 DIAGNOSIS — M79609 Pain in unspecified limb: Secondary | ICD-10-CM

## 2022-01-08 MED ORDER — KETOCONAZOLE 2 % EX CREA: TOPICAL_CREAM | CUTANEOUS | 2 refills | Status: DC

## 2022-01-08 NOTE — Patient Instructions (Signed)
Athlete's Foot Athlete's foot (tinea pedis) is a fungal infection of the skin on your feet. It often occurs on the skin that is between or underneath the toes. It can also occur on the soles of your feet. The infection can spread from person to person (is contagious). It can also spread when a person's bare feet come in contact with the fungus on shower floors or on items such as shoes. What are the causes? This condition is caused by a fungus that grows in warm, moist places. You can get athlete's foot by sharing shoes, shower stalls, towels, and wet floors with someone who is infected. Not washing your feet or changing your socks often enough can also lead to athlete's foot. What increases the risk? This condition is more likely to develop in: Men. People who have a weak body defense system (immune system). People who have diabetes. People who use public showers, such as at a gym. People who wear heavy-duty shoes, such as industrial or military shoes. Seasons with warm, humid weather. What are the signs or symptoms? Symptoms of this condition include: Itchy areas between your toes or on the soles of your feet. White, flaky, or scaly areas between your toes or on the soles of your feet. Very itchy small blisters between your toes or on the soles of your feet. Small cuts in your skin. These cuts can become infected. Thick or discolored toenails. How is this diagnosed? This condition may be diagnosed with a physical exam and a review of your medical history. Your health care provider may also take a skin or toenail sample to examine under a microscope. How is this treated? This condition is treated with antifungal medicines. These may be applied as powders, ointments, or creams. In severe cases, an oral antifungal medicine may be given. Follow these instructions at home: Medicines Apply or take over-the-counter and prescription medicines only as told by your health care provider. Apply your  antifungal medicine as told by your health care provider. Do not stop using the antifungal even if your condition improves. Foot care Do not scratch your feet. Keep your feet dry: Wear cotton or wool socks. Change your socks every day or if they become wet. Wear shoes that allow air to flow, such as sandals or canvas tennis shoes. Wash and dry your feet, including the area between your toes. Also, wash and dry your feet: Every day or as told by your health care provider. After exercising. General instructions Do not let others use towels, shoes, nail clippers, or other personal items that touch your feet. Protect your feet by wearing sandals in wet areas, such as locker rooms and shared showers. Keep all follow-up visits. This is important. If you have diabetes, keep your blood sugar under control. Contact a health care provider if: You have a fever. You have swelling, soreness, warmth, or redness in your foot. Your feet are not getting better with treatment. Your symptoms get worse. You have new symptoms. You have severe pain. Summary Athlete's foot (tinea pedis) is a fungal infection of the skin on your feet. It often occurs on skin that is between or underneath the toes. This condition is caused by a fungus that grows in warm, moist places. Symptoms include white, flaky, or scaly areas between your toes or on the soles of your feet. This condition is treated with antifungal medicines. Keep your feet clean. Always dry them thoroughly. This information is not intended to replace advice given to you by   your health care provider. Make sure you discuss any questions you have with your health care provider. Document Revised: 03/09/2021 Document Reviewed: 03/09/2021 Elsevier Patient Education  2022 Elsevier Inc.    

## 2022-01-08 NOTE — Progress Notes (Signed)
Chief Complaint  Patient presents with   Diabetes    Diabetic foot exam  Nail trim  Callus      HPI: Patient is 66 y.o. female who presents today for a diabetic foot examination as well as a nail trim and callus care.  She has several calluses which are painful and symptomatic as well as toenails which are painful and symptomatic with pressure in shoe gear.  She also has dry flaky skin of her feet which she states topical creams have not been helpful.  She was last seen by Dr. Cannon Kettle in 2017.  Patient Active Problem List   Diagnosis Date Noted   Right hip pain 04/03/2021   Palpitations 12/12/2020   Allergic urticaria 10/31/2019   Dermographia 10/31/2019   Other allergic rhinitis 10/31/2019   Current use of beta blocker 10/31/2019   Allergy to insect bites 10/31/2019   Hx of colonic polyp 01/03/2018   Angina, class III ([place]) 12/07/2017   Abnormal nuclear stress test 12/07/2017   Type II diabetes mellitus ([place])    Sleep apnea    Renal disorder    History of gout    Heart murmur    GERD (gastroesophageal reflux disease)    CAD S/P percutaneous coronary angioplasty    Cancer ([place])    Breast cancer ([place])    Hyperlipidemia LDL goal <70 11/16/2016   History of breast cancer 01/24/2016   Abnormal finding on mammography 01/17/2016   Chest pain 06/06/2015   Diarrhea 06/06/2015   Essential hypertension 06/06/2015   Depression    Encounter for therapeutic drug monitoring 02/19/2015   Mitral regurgitation 01/30/2015   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[place]) 01/30/2015   Chronic combined systolic and diastolic heart failure ([place]) 01/30/2015   Dyspnea 11/24/2014   Cardiomyopathy ([place]) 11/24/2014   DVT (deep venous thrombosis) ([place]) 11/24/2014   DM type 2 (diabetes mellitus, type 2) ([place]) 11/24/2014   HCAP (healthcare-associated pneumonia) 11/22/2014   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   Controlled type 2 diabetes mellitus  without complication ([place]) 31/49/7026   High sodium levels 10/29/2014   Elevated WBC count 10/29/2014   Acute confusion due to medical condition 10/27/2014   Insomnia due to medical condition 10/27/2014   Acute respiratory failure ([place]) 10/24/2014   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     Alirocumab (PRALUENT) 150 MG/ML SOAJ Inject 150 mg into the skin every 14 (fourteen) days. 2 mL 11   aspirin EC 81 MG tablet Take 1 tablet (81 mg total) by mouth daily.     blood glucose meter kit and supplies KIT by Does not apply route daily as needed. Dispense based on patient and insurance preference. Use up to four times daily as directed. (FOR ICD-9 250.00, 250.01).     BRILINTA 60 MG TABS tablet TAKE 1 TABLET (60 MG TOTAL) BY MOUTH 2 (TWO) TIMES DAILY. (Patient taking differently: Take 60 mg by mouth 2 (two) times daily.) 180 tablet  2   cholecalciferol (VITAMIN D3) 25 MCG (1000 UNIT) tablet Take 1,000 Units by mouth daily.     diclofenac Sodium (VOLTAREN) 1 % GEL Apply 2 g topically 4 (four) times daily. 100 g 0   exemestane (AROMASIN) 25 MG tablet Take 25 mg by mouth daily after breakfast.      fluticasone (FLONASE) 50 MCG/ACT nasal spray 2 sprays per nostril at night for stuffy nose at 7pm (Patient taking differently: Place 2 sprays into both nostrils daily. at 7pm) 18.2 mL 5   furosemide (LASIX) 40 MG tablet TAKE 1 TABLET AS NEEDED FOR SHORTNESS OF BREATH OR WEIGHT GAIN OF 2 POUNDS IN 24 HOURS OR  5 POUNDS IN 1 WEEK (Patient taking differently: Take 40 mg by mouth daily as needed (SHORTNESS OF BREATH OR WEIGHT GAIN OF 2 POUNDS IN 24 HOURS OR 5 POUNDS IN 1 WEEK).) 30 tablet 3   glucose blood test strip Use as instructed. E11.5. To check blood glucose daily. 100 each 12   isosorbide mononitrate (IMDUR) 30 MG 24 hr tablet Take 1 tablet (30 mg total) by mouth daily. 90 tablet 3   magnesium oxide (MAG-OX) 400 MG tablet TAKE 1 TABLET BY MOUTH EVERY DAY (Patient taking differently: Take 400 mg by mouth daily.) 90 tablet 0   metFORMIN (GLUCOPHAGE-XR) 500 MG 24 hr tablet Take 1 tablet (500 mg total) by mouth daily with breakfast.     metoprolol succinate (TOPROL-XL) 25 MG 24 hr tablet Take 1 tablet (25 mg total) by mouth daily. 90 tablet 3   nitroGLYCERIN (NITROSTAT) 0.4 MG SL tablet Place 1 tablet (0.4 mg total) under the tongue every 5 (five) minutes as needed for chest pain. max of 3 if no relief call 911 25 tablet PRN   rosuvastatin (CRESTOR) 20 MG tablet Take 1 tablet by mouth 3 times per week (Patient taking differently: Take 10 mg by mouth every Monday, Wednesday, and Friday.) 30 tablet 6   sacubitril-valsartan (ENTRESTO) 49-51 MG Take 1 tablet by mouth 2 (two) times daily. 60 tablet 11   spironolactone (ALDACTONE) 25 MG tablet Take 1 tablet (25 mg total) by mouth daily. 90 tablet 2   Current Facility-Administered Medications on File Prior to Visit  Medication Dose Route Frequency Provider Last Rate Last Admin   sodium chloride flush (NS) 0.9 % injection 3 mL  3 mL Intravenous Q12H Chilton Si, MD        Allergies  Allergen Reactions   Dilaudid [Hydromorphone Hcl] Anaphylaxis, Itching and Swelling   Epinephrine Anxiety and Other (See Comments)    Severe anxiety   Lactose Intolerance (Gi) Diarrhea   [place] [Empagliflozin]     YEAST INFECTION     Review of Systems No fevers, chills, nausea, muscle aches, no difficulty breathing, no calf pain, no chest pain or shortness of  breath.   Physical Exam  General: Patient is awake, alert, and oriented x 3 and in no acute distress.   Integument: Skin is warm, dry and supple bilateral. Nails are tender, long, thickened and  dystrophic with subungual debris, consistent with onychomycosis, 1-5 bilateral. + scaly skin in mocasson distrubution suggestive on tinea t noted.. No open lesions noted.  Several hyperkeratotic lesions are noted submetatarsal 4 of the right foot as well as submetatarsal 1 and 3 of the left foot they are painful with pressure.  Groundglass appearance is noted with intact integument is noted postdebridement.  Corn to left 5th toe with no signs of infection. Remaining integument unremarkable.  Vasculature:  Dorsalis Pedis pulse 2/4 bilateral. Posterior Tibial pulse  1/4 bilateral.  Capillary fill time <3 sec 1-5 bilateral. Positive hair growth to the level of the digits. Temperature gradient within normal limits. No varicosities present bilateral. No edema present bilateral.    Neurology: The patient has intact sensation measured with a 5.07/10g Semmes Weinstein Monofilament at all pedal sites bilateral . Vibratory sensation diminished bilateral with tuning fork. No Babinski sign present bilateral. Subjective tingling.    Musculoskeletal: Asymptomatic hammertoe pedal deformities noted bilateral. Muscular strength 5/5 in all lower extremity muscular groups bilateral without pain on range of motion . No tenderness with calf compression bilateral.     Assessment     ICD-10-CM   1. Pain due to onychomycosis of nail  B35.1    M79.609     2. Tinea pedis of both feet  B35.3     3. Callus of foot  L84        Plan  Discussed exam findings with the patient.  Today I recommended debridement of the nails and calluses.  The patient did agree I debrided the nails with a nail nipper and power Dremel without complication.  I also pared the calluses x4 with a #15 blade without complication.  Recommended  routine appointments for follow-up.  I am also going to call her in some ketoconazole cream to apply to the plantar aspect of her feet for 4 weeks to see if this helps with the tinea.  She will be seen back for routine care appointment every 3 months and will call sooner if any questions or concerns arise.

## 2022-02-03 ENCOUNTER — Encounter: Payer: Self-pay | Admitting: General Practice

## 2022-03-23 ENCOUNTER — Encounter (HOSPITAL_BASED_OUTPATIENT_CLINIC_OR_DEPARTMENT_OTHER): Payer: Self-pay | Admitting: Cardiovascular Disease

## 2022-03-24 MED ORDER — ROSUVASTATIN CALCIUM 20 MG PO TABS: ORAL_TABLET | ORAL | 6 refills | Status: DC

## 2022-04-06 ENCOUNTER — Encounter: Payer: Self-pay | Admitting: Family Medicine

## 2022-04-06 ENCOUNTER — Ambulatory Visit: Payer: 59 | Admitting: Family Medicine

## 2022-04-06 VITALS — BP 157/82 | HR 76 | Ht 62.5 in | Wt 194.0 lb

## 2022-04-06 DIAGNOSIS — E66812 Obesity, class 2: Secondary | ICD-10-CM

## 2022-04-06 DIAGNOSIS — E1165 Type 2 diabetes mellitus with hyperglycemia: Secondary | ICD-10-CM | POA: Diagnosis not present

## 2022-04-06 DIAGNOSIS — I25119 Atherosclerotic heart disease of native coronary artery with unspecified angina pectoris: Secondary | ICD-10-CM

## 2022-04-06 DIAGNOSIS — Z6836 Body mass index (BMI) 36.0-36.9, adult: Secondary | ICD-10-CM

## 2022-04-06 DIAGNOSIS — I5042 Chronic combined systolic (congestive) and diastolic (congestive) heart failure: Secondary | ICD-10-CM

## 2022-04-06 DIAGNOSIS — I472 Ventricular tachycardia, unspecified: Secondary | ICD-10-CM

## 2022-04-06 DIAGNOSIS — C7981 Secondary malignant neoplasm of breast: Secondary | ICD-10-CM | POA: Diagnosis not present

## 2022-04-06 DIAGNOSIS — I824Z1 Acute embolism and thrombosis of unspecified deep veins of right distal lower extremity: Secondary | ICD-10-CM

## 2022-04-06 NOTE — Progress Notes (Signed)
? ?  Subjective:  ? ? Patient ID: Marie Tanner is a 66 y.o. female presenting with Vaginal Bleeding ? on 04/06/2022 ? ?HPI: ?Seen by primary care--had bleeding after pap--has had TAH and removal of cervix in 2000 due to fibroids and heavy vaginal bleeding. Pathology reviewed in Care everywhere and noted to contain cervix. She was sent over for bleeding. Has not had any before or after exam. Menopause about 10 years ago. ? ?Review of Systems  ?Constitutional:  Negative for chills and fever.  ?Respiratory:  Negative for shortness of breath.   ?Cardiovascular:  Negative for chest pain.  ?Gastrointestinal:  Negative for abdominal pain, nausea and vomiting.  ?Genitourinary:  Negative for dysuria.  ?Skin:  Negative for rash.  ?   ?Objective:  ?  ?BP (!) 157/82   Pulse 76   Ht 5' 2.5" (1.588 m)   Wt 194 lb (88 kg)   BMI 34.92 kg/m?  ?Physical Exam ?Exam conducted with a chaperone present.  ?Constitutional:   ?   General: She is not in acute distress. ?   Appearance: She is well-developed.  ?HENT:  ?   Head: Normocephalic and atraumatic.  ?Eyes:  ?   General: No scleral icterus. ?Cardiovascular:  ?   Rate and Rhythm: Normal rate.  ?Pulmonary:  ?   Effort: Pulmonary effort is normal.  ?Abdominal:  ?   Palpations: Abdomen is soft.  ?Genitourinary: ?   General: Normal vulva.  ?   Vagina: Normal. No vaginal discharge, erythema, bleeding or lesions.  ?Musculoskeletal:  ?   Cervical back: Neck supple.  ?Skin: ?   General: Skin is warm and dry.  ?Neurological:  ?   Mental Status: She is alert and oriented to person, place, and time.  ? ? ? ?   ?Assessment & Plan:  ?1. 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 ?2. Postmenopausal bleeding ?Related to exam. Vagina without lesion. No uterus or cervix present. ? ? ?Return if symptoms worsen or fail to improve. ? ?Donnamae Jude, MD ?04/06/2022 ?10:03 AM ? ? ? ?

## 2022-04-06 NOTE — Progress Notes (Signed)
Patient has vaginal bleeding. Had hysterectomy in 2000.patient did have bleeding after vaginal exam with primary care- patient reports no other bleeding since.  Kathrene Alu RN  ?

## 2022-04-08 ENCOUNTER — Encounter: Payer: Self-pay | Admitting: Podiatry

## 2022-04-08 ENCOUNTER — Ambulatory Visit (INDEPENDENT_AMBULATORY_CARE_PROVIDER_SITE_OTHER): Payer: 59 | Admitting: Podiatry

## 2022-04-08 DIAGNOSIS — Q828 Other specified congenital malformations of skin: Secondary | ICD-10-CM | POA: Diagnosis not present

## 2022-04-08 DIAGNOSIS — E119 Type 2 diabetes mellitus without complications: Secondary | ICD-10-CM

## 2022-04-08 DIAGNOSIS — M79674 Pain in right toe(s): Secondary | ICD-10-CM | POA: Diagnosis not present

## 2022-04-08 DIAGNOSIS — B351 Tinea unguium: Secondary | ICD-10-CM | POA: Diagnosis not present

## 2022-04-08 DIAGNOSIS — M2041 Other hammer toe(s) (acquired), right foot: Secondary | ICD-10-CM

## 2022-04-08 DIAGNOSIS — E785 Hyperlipidemia, unspecified: Secondary | ICD-10-CM

## 2022-04-08 DIAGNOSIS — M2042 Other hammer toe(s) (acquired), left foot: Secondary | ICD-10-CM | POA: Diagnosis not present

## 2022-04-08 DIAGNOSIS — I10 Essential (primary) hypertension: Secondary | ICD-10-CM

## 2022-04-08 DIAGNOSIS — M79675 Pain in left toe(s): Secondary | ICD-10-CM | POA: Diagnosis not present

## 2022-04-08 DIAGNOSIS — E1142 Type 2 diabetes mellitus with diabetic polyneuropathy: Secondary | ICD-10-CM

## 2022-04-08 DIAGNOSIS — B353 Tinea pedis: Secondary | ICD-10-CM | POA: Diagnosis not present

## 2022-04-08 DIAGNOSIS — I213 ST elevation (STEMI) myocardial infarction of unspecified site: Secondary | ICD-10-CM

## 2022-04-08 DIAGNOSIS — E1165 Type 2 diabetes mellitus with hyperglycemia: Secondary | ICD-10-CM

## 2022-04-08 MED ORDER — CLOTRIMAZOLE-BETAMETHASONE 1-0.05 % EX CREA: TOPICAL_CREAM | CUTANEOUS | 1 refills | Status: DC

## 2022-04-08 NOTE — Patient Instructions (Signed)
I have ordered a test for you to have your lower extremity circulation tested. You will receive a call to schedule at your convenience. ? ?Diabetes Mellitus and Foot Care ?Foot care is an important part of your health, especially when you have diabetes. Diabetes may cause you to have problems because of poor blood flow (circulation) to your feet and legs, which can cause your skin to: ?Become thinner and drier. ?Break more easily. ?Heal more slowly. ?Peel and crack. ?You may also have nerve damage (neuropathy) in your legs and feet, causing decreased feeling in them. This means that you may not notice minor injuries to your feet that could lead to more serious problems. Noticing and addressing any potential problems early is the best way to prevent future foot problems. ?How to care for your feet ?Foot hygiene ? ?Wash your feet daily with warm water and mild soap. Do not use hot water. Then, pat your feet and the areas between your toes until they are completely dry. Do not soak your feet as this can dry your skin. ?Trim your toenails straight across. Do not dig under them or around the cuticle. File the edges of your nails with an emery board or nail file. ?Apply a moisturizing lotion or petroleum jelly to the skin on your feet and to dry, brittle toenails. Use lotion that does not contain alcohol and is unscented. Do not apply lotion between your toes. ?Shoes and socks ?Wear clean socks or stockings every day. Make sure they are not too tight. Do not wear knee-high stockings since they may decrease blood flow to your legs. ?Wear shoes that fit properly and have enough cushioning. Always look in your shoes before you put them on to be sure there are no objects inside. ?To break in new shoes, wear them for just a few hours a day. This prevents injuries on your feet. ?Wounds, scrapes, corns, and calluses ? ?Check your feet daily for blisters, cuts, bruises, sores, and redness. If you cannot see the bottom of your  feet, use a mirror or ask someone for help. ?Do not cut corns or calluses or try to remove them with medicine. ?If you find a minor scrape, cut, or break in the skin on your feet, keep it and the skin around it clean and dry. You may clean these areas with mild soap and water. Do not clean the area with peroxide, alcohol, or iodine. ?If you have a wound, scrape, corn, or callus on your foot, look at it several times a day to make sure it is healing and not infected. Check for: ?Redness, swelling, or pain. ?Fluid or blood. ?Warmth. ?Pus or a bad smell. ?General tips ?Do not cross your legs. This may decrease blood flow to your feet. ?Do not use heating pads or hot water bottles on your feet. They may burn your skin. If you have lost feeling in your feet or legs, you may not know this is happening until it is too late. ?Protect your feet from hot and cold by wearing shoes, such as at the beach or on hot pavement. ?Schedule a complete foot exam at least once a year (annually) or more often if you have foot problems. Report any cuts, sores, or bruises to your health care provider immediately. ?Where to find more information ?American Diabetes Association: www.diabetes.org ?Association of Diabetes Care & Education Specialists: www.diabeteseducator.org ?Contact a health care provider if: ?You have a medical condition that increases your risk of infection and you have  any cuts, sores, or bruises on your feet. ?You have an injury that is not healing. ?You have redness on your legs or feet. ?You feel burning or tingling in your legs or feet. ?You have pain or cramps in your legs and feet. ?Your legs or feet are numb. ?Your feet always feel cold. ?You have pain around any toenails. ?Get help right away if: ?You have a wound, scrape, corn, or callus on your foot and: ?You have pain, swelling, or redness that gets worse. ?You have fluid or blood coming from the wound, scrape, corn, or callus. ?Your wound, scrape, corn, or  callus feels warm to the touch. ?You have pus or a bad smell coming from the wound, scrape, corn, or callus. ?You have a fever. ?You have a red line going up your leg. ?Summary ?Check your feet every day for blisters, cuts, bruises, sores, and redness. ?Apply a moisturizing lotion or petroleum jelly to the skin on your feet and to dry, brittle toenails. ?Wear shoes that fit properly and have enough cushioning. ?If you have foot problems, report any cuts, sores, or bruises to your health care provider immediately. ?Schedule a complete foot exam at least once a year (annually) or more often if you have foot problems. ?This information is not intended to replace advice given to you by your health care provider. Make sure you discuss any questions you have with your health care provider. ?Document Revised: 06/06/2020 Document Reviewed: 06/06/2020 ?Elsevier Patient Education ? [place]. ? ?

## 2022-04-09 ENCOUNTER — Ambulatory Visit (HOSPITAL_COMMUNITY)
Admission: RE | Admit: 2022-04-09 | Discharge: 2022-04-09 | Disposition: A | Discharge status: Home / Self Care | Payer: 59 | Source: Ambulatory Visit | Attending: Cardiovascular Disease | Admitting: Cardiovascular Disease

## 2022-04-09 DIAGNOSIS — E1165 Type 2 diabetes mellitus with hyperglycemia: Secondary | ICD-10-CM | POA: Insufficient documentation

## 2022-04-09 DIAGNOSIS — I213 ST elevation (STEMI) myocardial infarction of unspecified site: Secondary | ICD-10-CM | POA: Insufficient documentation

## 2022-04-09 DIAGNOSIS — M79604 Pain in right leg: Secondary | ICD-10-CM | POA: Diagnosis not present

## 2022-04-09 DIAGNOSIS — I10 Essential (primary) hypertension: Secondary | ICD-10-CM | POA: Insufficient documentation

## 2022-04-09 DIAGNOSIS — E785 Hyperlipidemia, unspecified: Secondary | ICD-10-CM | POA: Insufficient documentation

## 2022-04-09 NOTE — Progress Notes (Signed)
ANNUAL DIABETIC FOOT EXAM ? ?Subjective: ?Marie Tanner presents today for annual diabetic foot examination. ? ?Patient relates 12 year h/o diabetes. ? ?Patient has h/o wound of right foot. ? ?Patient admits numbness, tingling, burning for the past four months. She relates intermittent right LE pain when her right femoral artery was injured in 2015. ? ?Last known  HgA1c was 6.5%%. Patient did not check blood glucose this morning.States she checks it 3-4 times per week. ? ?She is on her feet quite a bit. 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 ? ?Risk factors:  cardiomyopathy, DVT, diabetes, history of foot/leg ulcer, h/o MI, HTN, CAD, CHF, hyperlipidemia. ? ?Drue Flirt, MD is patient's PCP. Last visit was March, 2023. ? ?Patient states she was given a prescription for Ketoconazole Cream on last visit and doesn't feet it has been very effective in resolving the scaling on her feet. She denies any worsening of symptoms. ? ?Past Medical History:  ?Diagnosis Date  ? Breast cancer ([place])   ? Cancer [facility])   ? Chronic combined systolic and diastolic heart failure ([place]) 01/30/2015  ? Coronary artery disease   ? Depression   ? DVT (deep venous thrombosis) ([place]) 11/15  ? left femerol artery injury during resusciation.  ? GERD (gastroesophageal reflux disease)   ? HCAP (healthcare-associated pneumonia) 11/22/2014  ? Heart murmur   ? High cholesterol   ? History of blood transfusion 09/2014  ? related to MI  ? History of gout   ? Hypertension   ? Liver laceration 10/19/14  ? Palpitations 12/12/2020  ? Pulmonary edema 10/19/14  ? Renal disorder   ? Right hip pain 04/03/2021  ? Sleep apnea   ? "suppose to wear mask but I don't" (11/22/2014)  ? STEMI (ST elevation myocardial infarction) ([place]) 10/19/14  ? Type II diabetes mellitus ([place])   ? Urticaria   ? ?Patient Active Problem List  ? Diagnosis Date Noted  ? Malignant neoplasm metastatic to breast ([place]) 04/06/2022  ? Type 2 diabetes mellitus with  hyperglycemia, without long-term current use of insulin ([place]) 04/06/2022  ? VT (ventricular tachycardia) ([place]) 04/06/2022  ? Class 2 severe obesity due to excess calories with serious comorbidity and body mass index (BMI) of 36.0 to 36.9 in adult [facility]) 04/06/2022  ? Right hip pain 04/03/2021  ? Palpitations 12/12/2020  ? Allergic urticaria 10/31/2019  ? Dermographia 10/31/2019  ? Other allergic rhinitis 10/31/2019  ? Current use of beta blocker 10/31/2019  ? Allergy to insect bites 10/31/2019  ? Hx of colonic polyp 01/03/2018  ? Angina, class III ([place]) 12/07/2017  ? Abnormal nuclear stress test 12/07/2017  ? Type II diabetes mellitus ([place])   ? Sleep apnea   ? Renal disorder   ? History of gout   ? Heart murmur   ? GERD (gastroesophageal reflux disease)   ? CAD S/P percutaneous coronary angioplasty   ? Cancer [facility])   ? Breast cancer ([place])   ? Hyperlipidemia LDL goal <70 11/16/2016  ? History of breast cancer 01/24/2016  ? Abnormal finding on mammography 01/17/2016  ? Chest pain 06/06/2015  ? Diarrhea 06/06/2015  ? Essential hypertension 06/06/2015  ? Depression   ? Encounter for therapeutic drug monitoring 02/19/2015  ? Mitral regurgitation 01/30/2015  ? 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[place]) 01/30/2015  ? Chronic combined systolic and diastolic heart failure ([place]) 01/30/2015  ? Dyspnea 11/24/2014  ? Cardiomyopathy ([place]) 11/24/2014  ? DVT (deep venous  thrombosis) ([place]) 11/24/2014  ? DM type 2 (diabetes mellitus, type 2) ([place]) 11/24/2014  ? HCAP (healthcare-associated pneumonia) 11/22/2014  ? 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 Controlled type 2 diabetes mellitus without complication ([place]) 95/07/3266  ? High sodium levels 10/29/2014  ? Elevated WBC count 10/29/2014  ? Acute confusion due to medical condition 10/27/2014  ? Insomnia due to medical condition 10/27/2014  ? Acute respiratory failure ([place]) 10/24/2014  ? Common femoral artery injury 10/21/2014  ?  Injury of kidney 10/20/2014  ? Laceration of liver 10/20/2014  ? STEMI (ST elevation myocardial infarction) ([place]) 10/19/2014  ? Pulmonary edema 10/19/2014  ? Liver laceration 10/19/2014  ? History of blood transfusion 09/30/2014  ? Diabetes mellitus ([place]) 04/19/2013  ? ?Past Surgical History:  ?Procedure Laterality Date  ? ABDOMINAL HYSTERECTOMY  ~ 2000  ? APPLICATION OF WOUND VAC  10/2014  ? "over my naval" (11/22/2014)  ? BREAST LUMPECTOMY WITH AXILLARY LYMPH NODE BIOPSY    ? BREAST SURGERY    ? [place]; 1982  ? CORONARY ANGIOPLASTY WITH STENT PLACEMENT  09/2014  ? "multiple"/notes 11/22/2014  ? EXTRACORPOREAL CIRCULATION    ? Femerol artery repair Right 09/2014  ? pt insists it was a right femoral artery repair on 11/22/2014  ? LAPAROSCOPIC ABDOMINAL EXPLORATION    ? LEFT HEART CATH AND CORONARY ANGIOGRAPHY N/A 12/07/2017  ? Procedure: LEFT HEART CATH AND CORONARY ANGIOGRAPHY;  Surgeon: Leonie Man, MD;  Location: [facility];  Service: Cardiovascular;  Laterality: N/A;  ? LEFT VENTRICULAR ASSIST DEVICE    ? pt is not aware of this hx on 11/22/2014  ? Liver laceration repair  09/2014  ? related to CPR/notes 11/22/2014  ? RIGHT/LEFT HEART CATH AND CORONARY ANGIOGRAPHY N/A 04/21/2021  ? Procedure: RIGHT/LEFT HEART CATH AND CORONARY ANGIOGRAPHY;  Surgeon: Nelva Bush, MD;  Location: [place] [facility];  Service: Cardiovascular;  Laterality: N/A;  ? TEE WITHOUT CARDIOVERSION N/A 03/13/2021  ? Procedure: TRANSESOPHAGEAL ECHOCARDIOGRAM (TEE);  Surgeon: [name], MD;  Location: [facility];  Service: Cardiology;  Laterality: N/A;  ? TRACHEOSTOMY  09/2014  ? "closed on it's own when they took it out"  ? TUBAL LIGATION  1982  ? ?Current Outpatient Medications on File Prior to Visit  ?Medication Sig Dispense Refill  ? acetaminophen (TYLENOL) 500 MG tablet Take 1,000 mg by mouth every 6 (six) hours as needed for moderate pain.    ? Alirocumab (PRALUENT) 150 MG/ML SOAJ Inject 150 mg  into the skin every 14 (fourteen) days. 2 mL 11  ? aspirin EC 81 MG tablet Take 1 tablet (81 mg total) by mouth daily.    ? blood glucose meter kit and supplies KIT by Does not apply route daily as needed. Dispense based on patient and insurance preference. Use up to four times daily as directed. (FOR ICD-9 250.00, 250.01).    ? BRILINTA 60 MG TABS tablet TAKE 1 TABLET (60 MG TOTAL) BY MOUTH 2 (TWO) TIMES DAILY. (Patient taking differently: Take 60 mg by mouth 2 (two) times daily.) 180 tablet 2  ? cholecalciferol (VITAMIN D3) 25 MCG (1000 UNIT) tablet Take 1,000 Units by mouth daily.    ? diclofenac Sodium (VOLTAREN) 1 % GEL Apply 2 g topically 4 (four) times daily. 100 g 0  ? exemestane (AROMASIN) 25 MG tablet Take 25 mg by mouth daily after breakfast.     ? fluticasone (FLONASE) 50 MCG/ACT nasal spray 2 sprays  per nostril at night for stuffy nose at 7pm (Patient taking differently: Place 2 sprays into both nostrils daily. at 7pm) 18.2 mL 5  ? furosemide (LASIX) 40 MG tablet TAKE 1 TABLET AS NEEDED FOR SHORTNESS OF BREATH OR WEIGHT GAIN OF 2 POUNDS IN 24 HOURS OR 5 POUNDS IN 1 WEEK (Patient taking differently: Take 40 mg by mouth daily as needed (SHORTNESS OF BREATH OR WEIGHT GAIN OF 2 POUNDS IN 24 HOURS OR 5 POUNDS IN 1 WEEK).) 30 tablet 3  ? glucose blood test strip Use as instructed. E11.5. To check blood glucose daily. 100 each 12  ? isosorbide mononitrate (IMDUR) 30 MG 24 hr tablet Take 1 tablet (30 mg total) by mouth daily. 90 tablet 3  ? magnesium oxide (MAG-OX) 400 MG tablet TAKE 1 TABLET BY MOUTH EVERY DAY (Patient taking differently: Take 400 mg by mouth daily.) 90 tablet 0  ? metFORMIN (GLUCOPHAGE-XR) 500 MG 24 hr tablet Take 1 tablet (500 mg total) by mouth daily with breakfast.    ? metoprolol succinate (TOPROL-XL) 25 MG 24 hr tablet Take 1 tablet (25 mg total) by mouth daily. 90 tablet 3  ? nitroGLYCERIN (NITROSTAT) 0.4 MG SL tablet Place 1 tablet (0.4 mg total) under the tongue every 5 (five) minutes  as needed for chest pain. max of 3 if no relief call 911 25 tablet PRN  ? rosuvastatin (CRESTOR) 20 MG tablet Take 1 tablet by mouth 3 times per week 30 tablet 6  ? sacubitril-valsartan (ENTRESTO) 49-51 MG Take 1 ta

## 2022-04-10 ENCOUNTER — Other Ambulatory Visit: Payer: Self-pay | Admitting: Podiatry

## 2022-04-10 DIAGNOSIS — E785 Hyperlipidemia, unspecified: Secondary | ICD-10-CM

## 2022-04-10 DIAGNOSIS — I252 Old myocardial infarction: Secondary | ICD-10-CM

## 2022-04-10 DIAGNOSIS — E1142 Type 2 diabetes mellitus with diabetic polyneuropathy: Secondary | ICD-10-CM

## 2022-04-10 DIAGNOSIS — I998 Other disorder of circulatory system: Secondary | ICD-10-CM

## 2022-04-10 NOTE — Progress Notes (Signed)
Patient notified of ABI results. Order placed for Vascular consultation. ?

## 2022-04-20 NOTE — Progress Notes (Unsigned)
[facility] [place]  ASSESSMENT / PLAN: 66 y.o. female with bilateral lower extremity swelling which is mildly symptomatic.  She has noncompressible arteries consistent with her history of diabetes.  She has no symptoms from peripheral arterial disease.  I counseled her to do typical care for lower extremity swelling including compression, elevation, hydration, avoidance of salt.  Recommend best medical therapy for peripheral arterial disease including aspirin, statin therapy, good control of blood glucose, blood pressure, cholesterol and avoidance of tobacco products.  Follow-up with me as needed.  CHIEF COMPLAINT: Lower extremity swelling, possible peripheral arterial disease  HISTORY OF PRESENT ILLNESS: Marie Tanner is a 66 y.o. female referred to clinic for evaluation of swelling and peripheral arterial disease.  The patient reports no symptoms typical of intermittent claudication, ischemic rest pain.  She has no ulcers about her feet.  She does endorse some swelling about her lower extremities.  She notices this worsens over the course of the day.  It resolves in the morning after waking from sleep.   Past Medical History:  Diagnosis Date   Breast cancer ([place])    Cancer ([place])    Chronic combined systolic and diastolic heart failure ([place]) 01/30/2015   Coronary artery disease    Depression    DVT (deep venous thrombosis) ([place]) 11/15   left femerol artery injury during resusciation.   GERD (gastroesophageal reflux disease)    HCAP (healthcare-associated pneumonia) 11/22/2014   Heart murmur    High cholesterol    History of blood transfusion 09/2014   related to MI   History of gout    Hypertension    Liver laceration 10/19/14   Palpitations 12/12/2020   Pulmonary edema 10/19/14   Renal disorder    Right hip pain 04/03/2021   Sleep apnea    "suppose to wear mask but I don't" (11/22/2014)   STEMI (ST elevation myocardial infarction) ([place]) 10/19/14   Type II  diabetes mellitus ([place])    Urticaria     Past Surgical History:  Procedure Laterality Date   ABDOMINAL HYSTERECTOMY  ~ 9381   APPLICATION OF WOUND VAC  10/2014   "over my naval" (11/22/2014)   BREAST LUMPECTOMY WITH AXILLARY LYMPH NODE BIOPSY     BREAST SURGERY     CESAREAN SECTION  1980; [place]  09/2014   "multiple"/notes 11/22/2014   EXTRACORPOREAL CIRCULATION     Femerol artery repair Right 09/2014   pt insists it was a right femoral artery repair on 11/22/2014   LAPAROSCOPIC ABDOMINAL EXPLORATION     LEFT HEART CATH AND CORONARY ANGIOGRAPHY N/A 12/07/2017   Procedure: LEFT HEART CATH AND CORONARY ANGIOGRAPHY;  Surgeon: Leonie Man, MD;  Location: [facility];  Service: Cardiovascular;  Laterality: N/A;   LEFT VENTRICULAR ASSIST DEVICE     pt is not aware of this hx on 11/22/2014   Liver laceration repair  09/2014   related to CPR/notes 11/22/2014   RIGHT/LEFT HEART CATH AND CORONARY ANGIOGRAPHY N/A 04/21/2021   Procedure: RIGHT/LEFT HEART CATH AND CORONARY ANGIOGRAPHY;  Surgeon: Nelva Bush, MD;  Location: [place] [facility];  Service: Cardiovascular;  Laterality: N/A;   TEE WITHOUT CARDIOVERSION N/A 03/13/2021   Procedure: TRANSESOPHAGEAL ECHOCARDIOGRAM (TEE);  Surgeon: [name], MD;  Location: [facility];  Service: Cardiology;  Laterality: N/A;   TRACHEOSTOMY  09/2014   "closed on it's own when they took it out"   [place]  Family History  Problem Relation Age of Onset   Diabetes Mother    Hypertension Mother    COPD Mother    Heart disease Father    Heart attack Father    Alzheimer's disease Father    Diabetes Brother    Asthma Daughter    Stroke Neg Hx    Colon cancer Neg Hx    Stomach cancer Neg Hx    Pancreatic cancer Neg Hx    AAA (abdominal aortic aneurysm) Neg Hx     Social History   Socioeconomic History   Marital status: Divorced    Spouse name: Not on file   Number  of children: 2   Years of education: Not on file   Highest education level: Not on file  Occupational History   Not on file  Tobacco Use   Smoking status: Never   Smokeless tobacco: Never  Vaping Use   Vaping Use: Never used  Substance and Sexual Activity   Alcohol use: Yes    Comment: occ   Drug use: No   Sexual activity: Not Currently  Other Topics Concern   Not on file  Social History Narrative   Not on file   Social Determinants of Health   Financial Resource Strain: Not on file  Food Insecurity: Not on file  Transportation Needs: Not on file  Physical Activity: Not on file  Stress: Not on file  Social Connections: Not on file  Intimate Partner Violence: Not on file    Allergies  Allergen Reactions   Dilaudid [Hydromorphone Hcl] Anaphylaxis, Itching and Swelling   Epinephrine Anxiety and Other (See Comments)    Severe anxiety   Lactose Intolerance (Gi) Diarrhea   Jardiance [Empagliflozin]     YEAST INFECTION     Current Outpatient Medications  Medication Sig Dispense Refill   acetaminophen (TYLENOL) 500 MG tablet Take 1,000 mg by mouth every 6 (six) hours as needed for moderate pain.     Alirocumab (PRALUENT) 150 MG/ML SOAJ Inject 150 mg into the skin every 14 (fourteen) days. 2 mL 11   aspirin EC 81 MG tablet Take 1 tablet (81 mg total) by mouth daily.     blood glucose meter kit and supplies KIT by Does not apply route daily as needed. Dispense based on patient and insurance preference. Use up to four times daily as directed. (FOR ICD-9 250.00, 250.01).     BRILINTA 60 MG TABS tablet TAKE 1 TABLET (60 MG TOTAL) BY MOUTH 2 (TWO) TIMES DAILY. (Patient taking differently: Take 60 mg by mouth 2 (two) times daily.) 180 tablet 2   cholecalciferol (VITAMIN D3) 25 MCG (1000 UNIT) tablet Take 1,000 Units by mouth daily.     clotrimazole-betamethasone (LOTRISONE) cream Apply to feet and between toes bid x 6 weeks 45 g 1   diclofenac Sodium (VOLTAREN) 1 % GEL Apply 2 g  topically 4 (four) times daily. 100 g 0   exemestane (AROMASIN) 25 MG tablet Take 25 mg by mouth daily after breakfast.      fluticasone (FLONASE) 50 MCG/ACT nasal spray 2 sprays per nostril at night for stuffy nose at 7pm (Patient taking differently: Place 2 sprays into both nostrils daily. at 7pm) 18.2 mL 5   furosemide (LASIX) 40 MG tablet TAKE 1 TABLET AS NEEDED FOR SHORTNESS OF BREATH OR WEIGHT GAIN OF 2 POUNDS IN 24 HOURS OR 5 POUNDS IN 1 WEEK (Patient taking differently: Take 40 mg by mouth daily as needed (SHORTNESS OF BREATH OR  WEIGHT GAIN OF 2 POUNDS IN 24 HOURS OR 5 POUNDS IN 1 WEEK).) 30 tablet 3   glucose blood test strip Use as instructed. E11.5. To check blood glucose daily. 100 each 12   magnesium oxide (MAG-OX) 400 MG tablet TAKE 1 TABLET BY MOUTH EVERY DAY (Patient taking differently: Take 400 mg by mouth daily.) 90 tablet 0   metFORMIN (GLUCOPHAGE-XR) 500 MG 24 hr tablet Take 1 tablet (500 mg total) by mouth daily with breakfast.     metoprolol succinate (TOPROL-XL) 25 MG 24 hr tablet Take 1 tablet (25 mg total) by mouth daily. 90 tablet 3   nitroGLYCERIN (NITROSTAT) 0.4 MG SL tablet Place 1 tablet (0.4 mg total) under the tongue every 5 (five) minutes as needed for chest pain. max of 3 if no relief call 911 25 tablet PRN   rosuvastatin (CRESTOR) 20 MG tablet Take 1 tablet by mouth 3 times per week 30 tablet 6   sacubitril-valsartan (ENTRESTO) 49-51 MG Take 1 tablet by mouth 2 (two) times daily. 60 tablet 11   spironolactone (ALDACTONE) 25 MG tablet Take 1 tablet (25 mg total) by mouth daily. 90 tablet 2   isosorbide mononitrate (IMDUR) 30 MG 24 hr tablet Take 1 tablet (30 mg total) by mouth daily. 90 tablet 3   Current Facility-Administered Medications  Medication Dose Route Frequency Provider Last Rate Last Admin   sodium chloride flush (NS) 0.9 % injection 3 mL  3 mL Intravenous Q12H Skeet Latch, MD        PHYSICAL EXAM Vitals:   04/21/22 1020  BP: (!) 147/84   Pulse: 70  Resp: 20  Temp: 98.4 F (36.9 C)  SpO2: 98%  Weight: 194 lb (88 kg)  Height: 5' 2.5" (1.588 m)    Constitutional: Well-appearing woman in no acute distress Cardiac: Regular rate and rhythm Respiratory: Unlabored breathing Peripheral vascular: No palpable pedal pulses.  Feet are warm and well-perfused.  No ulcers about the toes, interdigital spaces or heel.  PERTINENT LABORATORY AND RADIOLOGIC DATA  Most recent CBC    Latest Ref Rng & Units 08/31/2021   11:45 AM 04/21/2021    8:32 AM 04/21/2021    8:30 AM  CBC  WBC 4.0 - 10.5 K/uL 3.8      Hemoglobin 12.0 - 15.0 g/dL 12.6   12.2   12.2    Hematocrit 36.0 - 46.0 % 37.8   36.0   36.0    Platelets 150 - 400 K/uL 294         Most recent CMP    Latest Ref Rng & Units 08/31/2021   11:45 AM 04/21/2021    8:32 AM 04/21/2021    8:30 AM  CMP  Glucose 70 - 99 mg/dL 88      BUN 8 - 23 mg/dL 9      Creatinine 0.44 - 1.00 mg/dL 0.76      Sodium 135 - 145 mmol/L 136   140   141    Potassium 3.5 - 5.1 mmol/L 4.2   4.0   3.9    Chloride 98 - 111 mmol/L 105      CO2 22 - 32 mmol/L 25      Calcium 8.9 - 10.3 mg/dL 9.2      Total Protein 6.5 - 8.1 g/dL 9.0      Total Bilirubin 0.3 - 1.2 mg/dL 0.8      Alkaline Phos 38 - 126 U/L 63      AST 15 -  41 U/L 25      ALT 0 - 44 U/L 18        Renal function CrCl cannot be calculated (Patient's most recent lab result is older than the maximum 21 days allowed.).  Hemoglobin A1C (%)  Date Value  01/22/2020 7.0 (A)   Hgb A1c MFr Bld (%)  Date Value  10/28/2017 6.3 (H)    LDL Chol Calc (NIH)  Date Value Ref Range Status  03/05/2021 97 0 - 99 mg/dL Final     +-------+-----------+-----------+------------+------------+  ABI/TBIToday's ABIToday's TBIPrevious ABIPrevious TBI  +-------+-----------+-----------+------------+------------+  Right  1.16       [place]                                   +-------+-----------+-----------+------------+------------+  Left   1.16        [place]                                   +-------+-----------+-----------+------------+------------+   [name]. Stanford Breed, MD [facility] Phone Number: [phone] 04/21/2022 4:34 PM  Total time spent on preparing this encounter including chart review, data review, collecting history, examining the patient, coordinating care for this new patient, 60 minutes.  Portions of this report may have been transcribed using voice recognition software.  Every effort has been made to ensure accuracy; however, inadvertent computerized transcription errors may still be present.

## 2022-04-21 ENCOUNTER — Ambulatory Visit (INDEPENDENT_AMBULATORY_CARE_PROVIDER_SITE_OTHER): Payer: 59 | Admitting: Vascular Surgery

## 2022-04-21 ENCOUNTER — Encounter: Payer: Self-pay | Admitting: Vascular Surgery

## 2022-04-21 VITALS — BP 147/84 | HR 70 | Temp 98.4°F | Resp 20 | Ht 62.5 in | Wt 194.0 lb

## 2022-04-21 DIAGNOSIS — I739 Peripheral vascular disease, unspecified: Secondary | ICD-10-CM

## 2022-04-21 DIAGNOSIS — M7989 Other specified soft tissue disorders: Secondary | ICD-10-CM

## 2022-05-07 ENCOUNTER — Ambulatory Visit (INDEPENDENT_AMBULATORY_CARE_PROVIDER_SITE_OTHER): Payer: 59

## 2022-05-07 ENCOUNTER — Other Ambulatory Visit (HOSPITAL_BASED_OUTPATIENT_CLINIC_OR_DEPARTMENT_OTHER): Payer: 59

## 2022-05-07 DIAGNOSIS — I340 Nonrheumatic mitral (valve) insufficiency: Secondary | ICD-10-CM

## 2022-05-07 LAB — ECHOCARDIOGRAM COMPLETE
AR max vel: 1.98 cm2
AV Area VTI: 1.84 cm2
AV Area mean vel: 1.91 cm2
AV Mean grad: 3 mmHg
AV Peak grad: 5.1 mmHg
Ao pk vel: 1.13 m/s
Area-P 1/2: 4.8 cm2
Calc EF: 43.8 %
MV M vel: 5.11 m/s
MV Peak grad: 104.4 mmHg
MV VTI: 1.85 cm2
S' Lateral: 4.03 cm
Single Plane A2C EF: 47.9 %
Single Plane A4C EF: 38.4 %

## 2022-05-07 MED ORDER — PERFLUTREN LIPID MICROSPHERE
1.0000 mL | INTRAVENOUS | Status: AC | PRN
  Administered 2022-05-07: 3 mL via INTRAVENOUS

## 2022-05-13 ENCOUNTER — Encounter (HOSPITAL_BASED_OUTPATIENT_CLINIC_OR_DEPARTMENT_OTHER): Payer: Self-pay | Admitting: Cardiovascular Disease

## 2022-05-13 ENCOUNTER — Ambulatory Visit (INDEPENDENT_AMBULATORY_CARE_PROVIDER_SITE_OTHER): Payer: 59 | Admitting: Cardiovascular Disease

## 2022-05-13 VITALS — BP 146/78 | HR 83 | Ht 62.5 in | Wt 195.1 lb

## 2022-05-13 DIAGNOSIS — I2510 Atherosclerotic heart disease of native coronary artery without angina pectoris: Secondary | ICD-10-CM

## 2022-05-13 DIAGNOSIS — I10 Essential (primary) hypertension: Secondary | ICD-10-CM

## 2022-05-13 DIAGNOSIS — Z79899 Other long term (current) drug therapy: Secondary | ICD-10-CM

## 2022-05-13 DIAGNOSIS — E785 Hyperlipidemia, unspecified: Secondary | ICD-10-CM

## 2022-05-13 DIAGNOSIS — I472 Ventricular tachycardia, unspecified: Secondary | ICD-10-CM

## 2022-05-13 DIAGNOSIS — G4733 Obstructive sleep apnea (adult) (pediatric): Secondary | ICD-10-CM

## 2022-05-13 DIAGNOSIS — I340 Nonrheumatic mitral (valve) insufficiency: Secondary | ICD-10-CM

## 2022-05-13 DIAGNOSIS — Z9861 Coronary angioplasty status: Secondary | ICD-10-CM

## 2022-05-13 NOTE — Patient Instructions (Signed)
Medication Instructions:  STOP Isosorbide(Imdur)  *If you need a refill on your cardiac medications before your next appointment, please call your pharmacy*   Lab Work: Fasting Lipids and CMP  If you have labs (blood work) drawn today and your tests are completely normal, you will receive your results only by: [place] (if you have MyChart) OR A paper copy in the mail If you have any lab test that is abnormal or we need to change your treatment, we will call you to review the results.   Testing/Procedures: None Ordered   Follow-Up: At [facility], you and your health needs are our priority.  As part of our continuing mission to provide you with exceptional heart care, we have created designated Provider Care Teams.  These Care Teams include your primary Cardiologist (physician) and Advanced Practice Providers (APPs -  Physician Assistants and Nurse Practitioners) who all work together to provide you with the care you need, when you need it.  We recommend signing up for the patient portal called "MyChart".  Sign up information is provided on this After Visit Summary.  MyChart is used to connect with patients for Virtual Visits (Telemedicine).  Patients are able to view lab/test results, encounter notes, upcoming appointments, etc.  Non-urgent messages can be sent to your provider as well.   To learn more about what you can do with MyChart, go to [email or web address].    Your next appointment:   6 month(s)  The format for your next appointment:   In Person  Provider:   Skeet Latch, MD{    Other Instructions  2 Months with [place]

## 2022-05-13 NOTE — Progress Notes (Signed)
Cardiology Office Note  Date:  05/13/2022   ID:  Marie Tanner, DOB 10/26/1956, MRN [id number]  PCP:  Drue Flirt, MD  Cardiologist:   Skeet Latch, MD  Oncologist: Samuel Bouche, MD Radiation Oncologist: Ladona Horns, MD Surgical Oncologist: Nancie Neas, MD  No chief complaint on file.   History of Present Illness: Marie Tanner is a 66 y.o. female with CAD s/p STEMI, hypertension, chronic systolic and diastolic heart failure, OSA not on CPAP, diabetes, prior DVT, breast cancer, and mitral regurgitation who presents for follow up.  In 2015 she had a STEMI and cardiac arrest. She was cared for at [facility]. She ultimately received 2 DES (LM/LAD and LCx).  She had a prolonged hospitalization requiring tracheostomy.  Her liver was lacerated during CPR.  She required surgical repair after femoral catheterization.  She also developed a DVT in the hospital.  One week after discharge she developed pneumonia and was admitted at [facility].  She has been seen in the hospital twice for chest pain and ruled out for MI.  She had a negative Myoview 05/2015.  She also had negative CT scans for PE.  Marie Tanner also underwent surgery for breast cancer on 02/21/16.  She completed her radiation in September 2017.  She was referred for sleep study which was abnormal.  However, she declined using the CPAP device and is in the process of being fitted for an oral airway.  Marie Tanner had an echo 06/2016 that showed LVEF 50-55% with grade 1 diastolic dysfunction.    Marie Tanner reported an episode of chest pain that occurred while driving.  She was referred for [facility] 11/24/17 that revealed LVEF 36% with anterior, anteroseptal, apical inferior and apical lateral defects.  There was concern for infarct with peri-infarct ischemia in the inferior and apical walls.  She underwent LHC 11/2017 that revealed patent stents that were heavily calcified and there was a step down after the takeoff of  the diagonal.  She had 40% RI, 40% proximal LCx and 20% prox LAD.  LVEF was 35-45% and LVEDP was elevated.  BiDil was added to her regimen for afterload reduction and poorly controlled hypertension.  She reported increased dyspnea on exertion and edema.  Lasix was increased for 2 days.  She was also referred for an echocardiogram 06/2018 that revealed LVEF 45 to 50% with hypokinesis of the mid to apical inferior and inferoseptal myocardium that was relatively unchanged from prior.  She was also started on spironolactone.  She had atypical chest pain and was referred for an ETT 10/2018 that was negative.  She achieved 6.2 METS on a Bruce protocol.    Marie Tanner reported episodes of dizziness.  Losartan was reduced to 12.5 mg she also had persistent episodes of chest discomfort that were atypical and occurred when lying down.  Thought to be secondary to GERD and her PPI was restarted.  Marie Tanner was struggling due to cost of medications.  She had been out of her BiDil, ticagrelor, and Praluent.  BiDil was switched to hydralazine and Imdur.  Her BP was improving.  It had been as high as 170-180s. She was started on patient assistance for ticagrelor and Praluent.    Marie Tanner BP was above goal, so hydralazine was increased. She called the office 01/19/2021 with chest pain and shortness of breath. She was referred for a nuclear stress test that revealed LVEF 39% with concern for infarct with peri-infarct ischemia. She had a heart monitor 2/22  with 10 beats of NSVT. She also had an echo 03/19/2021 with LVEF 34-40% and moderate to severe mitral regurgitation.  She had an echo 02/2021 that revealed LVEF 35 to 40% and very eccentric mitral regurgitation.  Her left atrium was normal in size.  She was referred for TEE that revealed LVEF 45 to 50% with a very eccentric, anteriorly directed mitral regurgitant jet.  It was thought to be due to a small flail P3 scallop.  There is no evidence of pulmonary flow  reversal.  Based on PISA, the ERO was 0.17 cm with a regurgitant volume of 30 mL.  However this was thought to be underestimated in the setting of anesthesia and an eccentric jet.  Overall it was thought to be moderate to severe.  She was started on both [place].  She noted an improvement in her breathing. But Jardiance was stopped due to dysuria. Entresto was increased. She was referred to the structural heart team for evaluation of her valve. She had a R/L Heart Cath 04/21/2021 that showed patent left main and LAD stent. She had non obstructive disease up to 40% in the mid lad and left circumflex. Her filling pressures were normal and her trans-mitral gradient was normal, therefore medical management was recommended.  At her last appointment she was feeling well.  She started exercising at the [facility].  She struggled with lower extremity edema.  Marie Tanner saw Dr. Stanford Breed on 03/2022 for her lower extremity edema.  He recommended compression and elevation of her legs for her lower extremity edema.  He did not feel that she needed any intervention for PAD given her lack of symptoms.  He felt that her noncompressible arteries were consistent with diabetes.  She notes that she hasn't been sleeping well for a few months.  She can't stay asleep and feels like her mind is racing.  She has a lot of life stressors and has difficulty processing.  She has been walking 3 times per week up to 1.5 miles.  This helps and makes her have more energy.  She has no exertional chest pain.  Her breathing has improved with exercise.  She still has occasional episodes of chest pain but not with exertion.  She hasn't taken her BP much lately.  She is due to check her BP in about an hour.  She sometimes has swelling when she is on her legs for long periods.  She has been wearing compression socks as recommended and this does seem to help.  She has otherwise been well.  She notes that she has headaches that started about an  hour after   Past Medical History:  Diagnosis Date   Breast cancer ([place])    Cancer ([place])    Chronic combined systolic and diastolic heart failure ([place]) 01/30/2015   Coronary artery disease    Depression    DVT (deep venous thrombosis) ([place]) 11/15   left femerol artery injury during resusciation.   GERD (gastroesophageal reflux disease)    HCAP (healthcare-associated pneumonia) 11/22/2014   Heart murmur    High cholesterol    History of blood transfusion 09/2014   related to MI   History of gout    Hypertension    Liver laceration 10/19/14   Palpitations 12/12/2020   Pulmonary edema 10/19/14   Renal disorder    Right hip pain 04/03/2021   Sleep apnea    "suppose to wear mask but I don't" (11/22/2014)   STEMI (ST elevation myocardial infarction) ([place])  10/19/14   Type II diabetes mellitus ([place])    Urticaria     Past Surgical History:  Procedure Laterality Date   ABDOMINAL HYSTERECTOMY  ~ 8315   APPLICATION OF WOUND VAC  10/2014   "over my naval" (11/22/2014)   BREAST LUMPECTOMY WITH AXILLARY LYMPH NODE BIOPSY     BREAST SURGERY     CESAREAN SECTION  1980; [place] WITH STENT PLACEMENT  09/2014   "multiple"/notes 11/22/2014   EXTRACORPOREAL CIRCULATION     Femerol artery repair Right 09/2014   pt insists it was a right femoral artery repair on 11/22/2014   LAPAROSCOPIC ABDOMINAL EXPLORATION     LEFT HEART CATH AND CORONARY ANGIOGRAPHY N/A 12/07/2017   Procedure: LEFT HEART CATH AND CORONARY ANGIOGRAPHY;  Surgeon: Leonie Man, MD;  Location: [place] [facility];  Service: Cardiovascular;  Laterality: N/A;   LEFT VENTRICULAR ASSIST DEVICE     pt is not aware of this hx on 11/22/2014   Liver laceration repair  09/2014   related to CPR/notes 11/22/2014   RIGHT/LEFT HEART CATH AND CORONARY ANGIOGRAPHY N/A 04/21/2021   Procedure: RIGHT/LEFT HEART CATH AND CORONARY ANGIOGRAPHY;  Surgeon: Nelva Bush, MD;  Location: [place] [facility];  Service:  Cardiovascular;  Laterality: N/A;   TEE WITHOUT CARDIOVERSION N/A 03/13/2021   Procedure: TRANSESOPHAGEAL ECHOCARDIOGRAM (TEE);  Surgeon: [name], MD;  Location: [place];  Service: Cardiology;  Laterality: N/A;   TRACHEOSTOMY  09/2014   "closed on it's own when they took it out"   [place]     Current Outpatient Medications  Medication Sig Dispense Refill   acetaminophen (TYLENOL) 500 MG tablet Take 1,000 mg by mouth every 6 (six) hours as needed for moderate pain.     Alirocumab (PRALUENT) 150 MG/ML SOAJ Inject 150 mg into the skin every 14 (fourteen) days. 2 mL 11   aspirin EC 81 MG tablet Take 1 tablet (81 mg total) by mouth daily.     blood glucose meter kit and supplies KIT by Does not apply route daily as needed. Dispense based on patient and insurance preference. Use up to four times daily as directed. (FOR ICD-9 250.00, 250.01).     BRILINTA 60 MG TABS tablet TAKE 1 TABLET (60 MG TOTAL) BY MOUTH 2 (TWO) TIMES DAILY. (Patient taking differently: Take 60 mg by mouth 2 (two) times daily.) 180 tablet 2   cholecalciferol (VITAMIN D3) 25 MCG (1000 UNIT) tablet Take 1,000 Units by mouth daily.     clotrimazole-betamethasone (LOTRISONE) cream Apply to feet and between toes bid x 6 weeks 45 g 1   exemestane (AROMASIN) 25 MG tablet Take 25 mg by mouth daily after breakfast.      fluticasone (FLONASE) 50 MCG/ACT nasal spray 2 sprays per nostril at night for stuffy nose at 7pm (Patient taking differently: Place 2 sprays into both nostrils daily. at 7pm) 18.2 mL 5   furosemide (LASIX) 40 MG tablet TAKE 1 TABLET AS NEEDED FOR SHORTNESS OF BREATH OR WEIGHT GAIN OF 2 POUNDS IN 24 HOURS OR 5 POUNDS IN 1 WEEK (Patient taking differently: Take 40 mg by mouth daily as needed (SHORTNESS OF BREATH OR WEIGHT GAIN OF 2 POUNDS IN 24 HOURS OR 5 POUNDS IN 1 WEEK).) 30 tablet 3   glucose blood test strip Use as instructed. E11.5. To check blood glucose daily. 100 each 12   magnesium  oxide (MAG-OX) 400 MG tablet TAKE 1 TABLET BY MOUTH EVERY  DAY (Patient taking differently: Take 400 mg by mouth daily.) 90 tablet 0   metFORMIN (GLUCOPHAGE-XR) 500 MG 24 hr tablet Take 1 tablet (500 mg total) by mouth daily with breakfast.     metoprolol succinate (TOPROL-XL) 25 MG 24 hr tablet Take 1 tablet (25 mg total) by mouth daily. 90 tablet 3   nitroGLYCERIN (NITROSTAT) 0.4 MG SL tablet Place 1 tablet (0.4 mg total) under the tongue every 5 (five) minutes as needed for chest pain. max of 3 if no relief call 911 25 tablet PRN   rosuvastatin (CRESTOR) 20 MG tablet Take 1 tablet by mouth 3 times per week 30 tablet 6   sacubitril-valsartan (ENTRESTO) 49-51 MG Take 1 tablet by mouth 2 (two) times daily. 60 tablet 11   spironolactone (ALDACTONE) 25 MG tablet Take 1 tablet (25 mg total) by mouth daily. 90 tablet 2   Current Facility-Administered Medications  Medication Dose Route Frequency Provider Last Rate Last Admin   sodium chloride flush (NS) 0.9 % injection 3 mL  3 mL Intravenous Q12H Skeet Latch, MD        Allergies:   Dilaudid [hydromorphone hcl], Epinephrine, Lactose intolerance (gi), and Jardiance [empagliflozin]    Social History:  The patient  reports that she has never smoked. She has never used smokeless tobacco. She reports current alcohol use. She reports that she does not use drugs.   Family History:  The patient's family history includes Alzheimer's disease in her father; Asthma in her daughter; COPD in her mother; Diabetes in her brother and mother; Heart attack in her father; Heart disease in her father; Hypertension in her mother.    ROS:   Please see the history of present illness. (+) Chest discomfort (+) Fatigue (+) LE edema All other systems are reviewed and negative.    PHYSICAL EXAM: VS:  BP (!) 146/78   Pulse 83   Ht 5' 2.5" (1.588 m)   Wt 195 lb 1.6 oz (88.5 kg)   SpO2 99%   BMI 35.12 kg/m  , BMI Body mass index is 35.12 kg/m. GENERAL:  Well  appearing HEENT: Pupils equal round and reactive, fundi not visualized, oral mucosa unremarkable NECK:  No jugular venous distention, waveform within normal limits, carotid upstroke brisk and symmetric, no bruits LUNGS:  Clear to auscultation bilaterally HEART:  RRR.  PMI not displaced or sustained, S1 and S2 within normal limits, no S3, no S4, no clicks, no rubs, III/VI systolic murmur anteriorly ABD:  Flat, positive bowel sounds normal in frequency in pitch, no bruits, no rebound, no guarding, no midline pulsatile mass, no hepatomegaly, no splenomegaly EXT:  2 plus pulses throughout, no edema, no cyanosis no clubbing SKIN:  No rashes no nodules NEURO:  Cranial nerves II through XII grossly intact, motor grossly intact throughout PSYCH:  Cognitively intact, oriented to person place and time   EKG:  11/06/2021: EKG was not ordered. 03/06/2021: EKG was not ordered. 03/12/2020: Sinus rhythm.  Rate 81 bpm.  Prior inferior infarct. 02/08/2019: Sinus rhythm.  Rate 80 bpm.  Prior inferior infarct. 07/22/18: Sinu srhythm.  Rate 90 bpm.  Prior inferior infarct.   11/16/17: Sinus rhythm.  Rate 87 bpm.  Prior inferior infarct.  10/05/16: Sinus rhythm. Rate 85 bpm. Prior inferior infarct. 02/11/16: Sinus arrhythmia.  Rate 71 bpm. Non-specific ST-T changes.  CT-A Chest 08/31/2021: COMPARISON:  06/06/2015   FINDINGS: Cardiovascular: Heart size is normal. There is atherosclerotic calcification of coronary arteries. No pericardial effusion. Thoracic aorta is partially calcified  but not aneurysmal. Pulmonary arteries are well opacified by contrast bolus. There is no acute pulmonary embolus.   Mediastinum/Nodes: The visualized portion of the thyroid gland has a normal appearance. No significant mediastinal, hilar, or axillary adenopathy. Esophagus is unremarkable. Postoperative changes seen in the RIGHT axilla.   Lungs/Pleura: Presumed post radiation changes in the anterior RIGHT UPPER lobe, new since  prior study. No suspicious pulmonary nodules, consolidation, or pulmonary edema. Airways are patent.   Upper Abdomen: Possible layering sludge or stones within the gallbladder. Otherwise UPPER abdomen is unremarkable.   Musculoskeletal: Mild degenerative changes in the thoracic spine. No lytic or blastic lesions.   Review of the MIP images confirms the above findings.   IMPRESSION: 1. Technically adequate exam showing no acute pulmonary embolus. 2. Post radiation changes in the RIGHT UPPER lobe of the lung. Postsurgical changes in the RIGHT axilla. 3. Possible layering sludge or stones within the gallbladder, only partially imaged.  Right/Left Heart Cath 04/21/2021: Conclusions: Nonobstructive coronary artery disease with up to 40% stenosis involving the mid LAD and mid LCx.  Overall appearance is similar to prior catheterization in 2019. Patent LMCA/LAD stent. Normal left and right heart filling pressures. Mild pulmonary hypertension. Normal to supranormal Fick cardiac output/index. Normal transmitral gradient.   Recommendations: Continue evidence-based heart failure therapy and ongoing management of mitral regurgitation per Dr. Oval Tanner and valve team. Secondary prevention of coronary artery disease.  Diagnostic Dominance: Left   TEE 03/13/21: 1. Very eccentric, anteriorly directed jet of mitral valve regurgitation  with small flail segment at P3 scallop. No systolic reversals in pulmonary  veins. Mitral valve regurgitation appears moderate-severe. PISA  quantitation with angle correction  results in ERO 0.17 cm2 with regurgitant volume 30 mL. Patient's systolic  blood pressure was approximately 90 mmHg at time of MV regurgitation  evaluation, suspect degree of regurgitation may appear worse at ambulatory  blood pressures given flail segment.   The mitral valve is abnormal. Moderate to severe mitral valve  regurgitation. No evidence of mitral stenosis.   2. Left  ventricular ejection fraction, by estimation, is 45 to 50%. The  left ventricle has mildly decreased function.   3. Right ventricular systolic function is normal. The right ventricular  size is normal.   4. Left atrial size was moderately dilated. No left atrial/left atrial  appendage thrombus was detected. The LAA emptying velocity was 68 cm/s.   5. Right atrial size was moderately dilated.   6. A small pericardial effusion is present.   7. The aortic valve is normal in structure. Aortic valve regurgitation is  not visualized. No aortic stenosis is present.   8. There is Moderate (Grade III) atheroma plaque involving the transverse  and descending aorta.   9. Agitated saline contrast bubble study was positive with shunting  observed after >6 cardiac cycles suggestive of intrapulmonary shunting.  ECHO 03/04/2021:   1. Consider TEE to further evaluate mitral regugitation, very eccentric  jet which may be more severe than depicted on color Doppler.   2. Left ventricular ejection fraction, by estimation, is 35 to 40%. The  left ventricle has moderately decreased function. The left ventricle  demonstrates global hypokinesis. There is mild left ventricular  hypertrophy. Left ventricular diastolic  parameters are consistent with Grade I diastolic dysfunction (impaired  relaxation).   3. Right ventricular systolic function is normal. The right ventricular  size is normal.   4. The mitral valve is normal in structure. Moderate to severe mitral  valve regurgitation. No  evidence of mitral stenosis.   5. The aortic valve is tricuspid. There is mild calcification of the  aortic valve. There is mild thickening of the aortic valve. Aortic valve  regurgitation is not visualized. No aortic stenosis is present.   6. The inferior vena cava is normal in size with greater than 50%  respiratory variability, suggesting right atrial pressure of 3 mmHg.  Non-telemetry monitor study 01/22/2021: 30 Day Event  Monitor   Quality: Fair.  Baseline artifact. Predominant rhythm: sinus rhythm                         Average heart rate: 81 bpm Max heart rate: 157 bpm Min heart rate: 47 bpm   10 beats NSVT Patient did submit a symptom diary.  She reported shortness of breath and heart fluttering at which time sinus rhythm was noted.    01/17/2021 Stress Test : Study Highlights    The left ventricular ejection fraction is moderately decreased (30-44%). Nuclear stress EF: 39%. Blood pressure demonstrated a hypertensive response to exercise. Downsloping ST segment depression of 0.5 mm was noted in the II and aVF leads (ST deviation beginning in recovery). Defect 1: There is a medium defect of moderate severity present in the apical anterior and apex location. Defect 2: There is a medium defect of severe severity present in the mid inferior and apical inferior location. Findings consistent with prior myocardial infarction with peri-infarct ischemia. This is an intermediate risk study.   Intermediate risk stress nuclear study with evidence of previous infarction in the distal LAD artery and posterior descending artery distribution, with very limited peri-infarct ischemia.  Moderately reduced left ventricular global systolic function. No significant change since 2018.  07/26/18: Study Conclusions   - Left ventricle: The cavity size was normal. Systolic function was   mildly reduced. The estimated ejection fraction was in the range   of 45% to 50%. Hypokinesis of the mid-apicalinferior and   inferoseptal myocardium. Doppler parameters are consistent with   abnormal left ventricular relaxation (grade 1 diastolic   dysfunction). Acoustic contrast opacification revealed no   evidence ofthrombus. - Mitral valve: There was moderate regurgitation directed   eccentrically and anteriorly. - Left atrium: The atrium was mildly dilated. - Atrial septum: No defect or patent foramen ovale was identified.    Impressions:   - Direct comparison to images from August 2917 shows no change in   wall motion pattern or overall LV EF.  LHC 12/07/17: Mid LM to Ost LAD DES Stent is 10% stenosed. -This entire segment is heavily calcified. There is a significant step down after the stent at the takeoff of the diagonal branches. Prox LAD lesion is 20% stenosed. Ost Ramus lesion is 40% stenosed. Proc Circumflex ~40%. There is moderate left ventricular systolic dysfunction. The left ventricular ejection fraction is 35-45% by visual estimate. LV end diastolic pressure is moderately elevated. 22-24 mmHg  ETT 10/2018: No ischemia.  6.2 METS on Bruce protocol.[name]  Lexiscan Myoview 11/24/17: The left ventricular ejection fraction is moderately decreased (30-44%). Nuclear stress EF: 36%. There was no ST segment deviation noted during stress. There is a large defect of severe severity present in the mid anterior, mid inferior, apical anterior, apical septal, apical inferior, apical lateral and apex location. The defect is partially reversible. This is consistent with infarct with small amount of peri infarct ischemia in the inferior and apical walls. This is a high risk study. Compared to prior study the  EF has declined and there appears to be peri infarct ischemia in the inferior wall although this could also be variations in diaphragmatic attenuation artifact.  Recent Labs: 08/31/2021: ALT 18; BUN 9; Creatinine, Ser 0.76; Hemoglobin 12.6; Platelets 294; Potassium 4.2; Sodium 136    Lipid Panel    Component Value Date/Time   CHOL 166 03/05/2021 1132   TRIG 69 03/05/2021 1132   HDL 56 03/05/2021 1132   CHOLHDL 3.0 03/05/2021 1132   CHOLHDL 3.1 10/05/2016 0937   VLDL 15 10/05/2016 0937   LDLCALC 97 03/05/2021 1132    Wt Readings from Last 3 Encounters:  05/13/22 195 lb 1.6 oz (88.5 kg)  04/21/22 194 lb (88 kg)  04/06/22 194 lb (88 kg)     ASSESSMENT AND PLAN:  No problem-specific Assessment & Plan  notes found for this encounter.   # CAD s/p STEMI: # Chronic angina: # Hyperlipidemia:  # NSVT: Currently denies angina.  Most recent nuclear stress 12/2020 with LVEF 39% with prior infarct in the distal LAD and PDA with very limited peri-infarct ischemia.  Marie Tanner had an ST elevation myocardial infarction with stents placed in the LAD/left main and left circumflex arteries.  She is on low dose ticagrelor indefinitely.  LHC 11/2017 showed patent stents with 20-40% lesions.  No more chest pain.  She has noted some headaches about an hour after taking her medicines.  I suspect this is due to the Imdur.  She will stop the Imdur.  She will let us know if she starts having angina.  Continue current therapy with aspirin, Praluent, metoprolol, rosuvastatin, Entresto, and ticagrelor.  She has been out of Praluent for the last 2 months and her lipids were elevated.  We will refill it today and recheck lipids in 2 months.   # Chronic systolic and diastolic heart failure: # Hypertensive heart disease:  LVEF 45-50%.    She did not tolerate Jardiance due to vaginal irritation.  Blood pressure was elevated today but has generally been controlled.  Continue Entresto, metoprolol, and spironolactone.  Check blood pressures at home.  She will let us know if her blood pressures running over 130/80.     # OSA: Oral airway device.    # Shortness of breath:  # Moderate-severe MR:  # Extracardiac shunt:  On TEE there was a late positive bubble study.  This is concerning for an intrapulmonary shunt.  Her last chest imaging was in 2020 and there was no evidence of this.  She has no polycythemia and no right heart dysfunction or pulm elevated pulmonary pressures.  Chest CTA 08/2021 did not reveal any vascular abnormalities.  She has been evaluated by the structural heart team and medical management was recommended for now for her moderate to severe MR.  Repeat echo 05/2023.  #Stress Mangament: Will refer to Avelino Tanner for  stress management  Current medicines are reviewed at length with the patient today.  The patient does not have concerns regarding medicines.  The following changes have been made: stop Imdur  Labs/ tests ordered today include:   Orders Placed This Encounter  Procedures   Lipid panel   Comp Met (CMET)     Disposition:   FU with Marie Moren C. Oval Linsey, MD, [facility] in 6 months.  APP 2 months    Signed, Marie Mckey C. Oval Linsey, MD, [facility]  05/13/2022 10:08 AM    [place]

## 2022-05-19 ENCOUNTER — Other Ambulatory Visit (HOSPITAL_BASED_OUTPATIENT_CLINIC_OR_DEPARTMENT_OTHER): Payer: Medicaid Other

## 2022-05-29 ENCOUNTER — Telehealth: Payer: Self-pay

## 2022-05-29 DIAGNOSIS — Z0000 Encounter for general adult medical examination without abnormal findings: Secondary | ICD-10-CM

## 2022-05-29 NOTE — Telephone Encounter (Signed)
Called patient per referral from Dr. Oval Linsey for health coaching to address stress management. Patient is interested in talking more in detail about health coaching, discussing stressors, current ways of coping, and co-creating an action plan to manage stress better. Patient has been scheduled for her initial session over the phone on 7/5 ay 10:30am. Patient will be called at this time.    Jaloni Sorber Truman Hayward, [facility] Guide, Health Coach [address], [address] [place] [address] Telephone: [phone] Email: [email or web address]

## 2022-06-03 ENCOUNTER — Ambulatory Visit: Payer: 59

## 2022-06-03 ENCOUNTER — Telehealth: Payer: Self-pay

## 2022-06-03 DIAGNOSIS — Z0000 Encounter for general adult medical examination without abnormal findings: Secondary | ICD-10-CM

## 2022-06-03 NOTE — Telephone Encounter (Signed)
Called patient to hold initial health coaching session over the phone. Patient did not answer. Left message for patient to return call to hold session today or to reschedule.   Nakeita Styles Truman Hayward, [facility] Guide, Health Coach [address], [address] [place] [address] Telephone: [phone] Email: [email or web address]

## 2022-06-09 ENCOUNTER — Telehealth: Payer: Self-pay

## 2022-06-09 ENCOUNTER — Other Ambulatory Visit (HOSPITAL_BASED_OUTPATIENT_CLINIC_OR_DEPARTMENT_OTHER): Payer: Self-pay | Admitting: *Deleted

## 2022-06-09 DIAGNOSIS — Z0000 Encounter for general adult medical examination without abnormal findings: Secondary | ICD-10-CM

## 2022-06-09 DIAGNOSIS — Z01812 Encounter for preprocedural laboratory examination: Secondary | ICD-10-CM

## 2022-06-09 DIAGNOSIS — I340 Nonrheumatic mitral (valve) insufficiency: Secondary | ICD-10-CM

## 2022-06-09 NOTE — Telephone Encounter (Signed)
Patient called in to reschedule her initial health coaching session. Patient has been scheduled for a call on 7/12 at 10:30am.   Avelino Leeds, [place], Health Coach [address] [place] [address] Telephone: [phone] Email: [email or web address]

## 2022-06-10 ENCOUNTER — Ambulatory Visit (INDEPENDENT_AMBULATORY_CARE_PROVIDER_SITE_OTHER): Payer: Self-pay

## 2022-06-10 DIAGNOSIS — Z0000 Encounter for general adult medical examination without abnormal findings: Secondary | ICD-10-CM

## 2022-06-10 NOTE — Progress Notes (Signed)
Marie Tanner   Appointment Outcome:  Completed, Session #: Initial  Start time: 10:38am   End time: 11:28am   Total Mins: 50 minutes   AGREEMENTS SECTION    Overall Goal(s): Stress management                                            Agreement/Action Steps:  Read morning scripture Walk 2-3 days/week at 8am for approximately 1.5 miles Contact walking coach to work on increasing her distance Conduct self-check-ins Implement positive self-talk Practice gratitude reflections Practice deep breathing Utilize support system Be mindful of stress eating  Progress Notes:  Discussed with patient what her current stressors are, and she stated that her main issue is finances due to the loss of disability in 2020. Patient mentioned that she dealt with a lot since 2015 and didn't realize she was stressed until she was separated from a situation and began to experience health complications. Patient rated her stress a 10/10 at this time.   Patient expressed that it is exhausting trying to constantly figure things out each month with her finances and limited source of income. Patient reported that she does live with her sister, and they split the bills, but still is challenged with paying her half of the bills. Patient stated that at some points she does feel depressed but will talk with her sister about what is bothering her, which helps but try not to burden her often. Patient stated that she internalizes her emotions. Patient shared that she always appears to be cheerful when around others. Patient stated that she has no other choice to keep going because she doesn't have an option.  Patient expressed that she is disappointed in herself because of the space that she is at this point in her life. Patient stated that due to stress, she doesn't sleep well, and gets an average of 3 hours of sleep per night. Patient mentioned that she copes at times she drives somewhere and sit in her car to think and  get her head together. Patient shared that she snacks when she is stressed, and it has caused her weight to fluctuate. Patient stated that she takes deep breathes at times but is not something that she practices regularly.   Patient stated that she used to exercise and walk every morning, which did help her manage stress and gave her more energy. Patient mentioned that she was able to continue her routine until last month when she had a lot of things going on. Patient shared that it is hard to get motivated while she is stressed. Patient stated that cooking helps relieves stress and she reads a scripture every day.    Coaching Outcomes: Asked patient if she was interested in talking to a Education officer, museum regarding filing for her disability again. Offered to refer her to [place], [place] for further assistance. Patient stated that she went through the whole process with a lawyer to no avail. Patient mentioned that she would be able to apply for her SSI next year but is trying to decide if she want to wait until the age 66 to withdraw her benefits.   Patient stated that she is interested in walking again. Patient shared that she was walking 1.5 miles at a time and had worked her way up to being able to complete the 4-mile Heart Walk. Patient stated that her niece's  husband helped coach her walking and can reach out to him again. Patient stated that she would start walking 2-3 days at 8:00am per week when the weather permits.   Discussed with patient the concept of conducting self-check-ins since she has experienced a time when she didn't recognize that she was stressed. Patient stated that this was a strategy that she can try. Discussed with patient the effects of negative self-talk and internalizing stressed. Asked patient if she engages in positive self-talk at times. Patient mentioned that she relies on her faith to help look at things more positively.   Suggested the technique of engaging in gratitude  reflections to help think about what is going well and how she has been able to accomplish things even with help. Patient will continue to read her daily scripture. Patient mentioned that she can also try practicing deep breathing more to help calm herself when she is feeling overwhelmed.   Asked patient if she has support outside of her sister. Patient stated that she talks to her daughter sometimes but try not to burden her as well. Patient shared that her son supports her financially when he can. Asked patient if she had considered journaling since she doesn't like to discuss things with others often. Patient stated that she doesn't like writing about her feelings.   Patient is going to start being mindful of snacking when stressed and incorporate other action steps to reduce the amount of snacking that she does as a coping mechanism.   Emailed patient a copy of the welcome letter and code of ethics along with information on deep breathing techniques, positive self-talk, and how to conduct self-check-ins.

## 2022-06-24 ENCOUNTER — Ambulatory Visit: Payer: 59

## 2022-06-24 ENCOUNTER — Telehealth: Payer: Self-pay

## 2022-06-24 DIAGNOSIS — Z0000 Encounter for general adult medical examination without abnormal findings: Secondary | ICD-10-CM

## 2022-06-24 NOTE — Telephone Encounter (Signed)
Patient called in requesting to reschedule her health coaching session for today at 10:30am to tomorrow, 7/27 at 10:30am. Patient has been rescheduled and will be called at that time.   Virginia Curl Truman Hayward, [facility] Guide, Health Coach [address] [place] [address] Telephone: [phone] Email: [email or web address]

## 2022-06-25 ENCOUNTER — Ambulatory Visit (INDEPENDENT_AMBULATORY_CARE_PROVIDER_SITE_OTHER): Payer: Self-pay

## 2022-06-25 DIAGNOSIS — Z0000 Encounter for general adult medical examination without abnormal findings: Secondary | ICD-10-CM

## 2022-06-25 NOTE — Progress Notes (Signed)
Appointment Outcome: Completed, Session #: 1 Start time: 10:30am   End time: 10:53am   Total Mins: 23 minutes  AGREEMENTS SECTION    Overall Goal(s): Stress management                                             Agreement/Action Steps:  Read morning scripture/prayer Walk 2-3 days/week at 8am for approximately 1.5 miles Contact walking coach to work on increasing her distance Conduct self-check-ins Implement positive self-talk Practice gratitude reflections Practice deep breathing Utilize support system Be mindful of stress eating   Progress Notes:  Patient stated that she was not able to focus on herself over the past two weeks due to moving to a new residence. Patient stated that she was busy and consumed by the transition that she couldn't implement many of her action steps. Patient shared that she hadn't engaged in negative self-talk because she was not focused on herself as much while in the process of moving.   Patient shared that she has continued to read her scriptures and pray regularly. Patient reported that she was able to practice gratitude reflections because when she had the time to sit down to break away from everything, she would fall asleep. Patient stated that she did not intentionally practice deep breathing and did not pay attention to see if she took deep breaths at times without being aware of the behavior.   Patient stated that at one point she did not recognize that she was stressed due to not dwelling so much on her stressors. Patient mentioned that she eventually found herself able to identify that she was feeling overwhelmed, however your response to the stress was to block things out and keep moving forward.   Patient stated that she did not eat much over the past two weeks due to being constantly busy and had to grab food on the go when she did eat. Patient shared that she was so focus on the things that she had to deal with that she hadn't noticed if the way  she was eating was a stress response.   Patient shared that she was not able to start waking 2-3 days per week due to focusing on moving. Patient stated that her daughter is looking into getting her a pass to the [facility] so that she can participate in water aerobics. Patient stated that she is still interested in walking again because it was helpful before and will work to get started with her routine within another week.   Patient expressed that she did not have support during this time because she normally talks to her sister about things that are bothering her, but she was also dealing with the same situation of having to move. Patient stated that she doesn't talk to a lot of people and internalize her feelings.   Patient stated that since she is in her new place and is in the process of getting settles, she feels that it will be easier for her to concentrate on herself and implement her action steps to manage stress. Patient shared that she could persevere during stressful situations and was able to hold herself together over the past two weeks better than she has in previous circumstances.     Indicators of Success and Accountability:  Patient mentioned that she was able to deal with this stressful situation better than other times  without falling apart.  Readiness: Patient is in the action phase of stress management.  Strengths and Supports: Patient has her sister as a support person. Patient stated that she can persevere during stressful moments. Challenges and Barriers: Patient does not foresee any challenges to implementing her action steps over the next two weeks.    Coaching Outcomes: Discussed with patient the idea of setting a specific time for her to practice her gratitude reflections.  Patient stated that she does need to set a time to engage in this step. Suggested setting an alarm as a reminder once she decides on what time of day would be most feasible for her to reflect (e.g., while  reading her scriptures daily or as a wind down activity before bed).  Patient did not make changes to her action steps as outlined above to implement over the next two weeks.   Attempted: Fulfilled - Patient has been able to read her morning scripture and pray. Patient has conducted self-check-ins.  Not met - Patient was not able to start walking as scheduled or engage with her support system. Patient was not able to intentionally implement positive self-talk, practice gratitude reflections or deep breathing. Patient did not notice if her eating behavior was influenced by stress or lack of time.

## 2022-07-09 ENCOUNTER — Ambulatory Visit (INDEPENDENT_AMBULATORY_CARE_PROVIDER_SITE_OTHER): Payer: Self-pay

## 2022-07-09 DIAGNOSIS — Z0000 Encounter for general adult medical examination without abnormal findings: Secondary | ICD-10-CM

## 2022-07-09 NOTE — Progress Notes (Signed)
Appointment Outcome: Completed, Session #: 2 Start time: 10:30am   End time: 11:05am   Total Mins: 35 minutes  AGREEMENTS SECTION     Overall Goal(s): Stress management                                             Agreement/Action Steps:  Read morning scripture/prayer Walk 2-3 days/week at 8am for approximately 1.5 miles Contact walking coach to work on increasing her distance Conduct self-check-ins Implement positive self-talk Practice gratitude reflections Practice deep breathing Utilize support system Be mindful of stress eating   Progress Notes:  Patient shared that she has been intentional about reading the Bible and praying every morning. Patient stated that this keeps her grounded. Patient mentioned that she feels overwhelmed her check-ins are conducted through prayer. Patient stated that after praying she can calm down and reflect. Patient shared that she uses scripture as a means of positive self-talk to help her navigate stressful moments. Patient reported that she has not been intentional with practicing deep breathing but finds herself doing it automatically at times. Patient stated that she must remember to take time to practice deep breathing.   Patient rated her stress level a 4/10 because she is in the process of getting settled more in her new place of residence and feels that she is better able to handle things. Patient shared that she has been interacting with other women from church and spending time together outside of church. Patient expressed that being around the women from church has helped her with managing her stress and having support.   Patient mentioned that she hasn't been able to walk on a schedule but has planned with the group of church members to attend an exercise class on Wednesdays and Fridays in the morning between 9-10am. Patient shared that she walks in a store at least 3xs per week for about 30 minutes each time. Patient mentioned that she also  parks further away from the store, so she must walk more.  Patient mentioned that in the process of her getting settled in her new place, she has been engaged in housework for about approximately 2-3 hours daily except this past weekend. Patient shared that her housework includes scrubbing, mopping, vacuuming, doing laundry and folding/hanging up clothes and linens, and organizing her kitchen and pantry.   Patient stated that she has not had a chance to talk with her niece's husband regarding him being her coach again. Patient plans to reach out to him next Tuesday or Wednesday to discuss coaching. Patient shared that she met him at 6am to do resistance training and cardio to improve her strength and endurance to complete the HeartWalk. Patient mentioned that she was motivated to meet at that time on training days because she needed to get her heart rate up, improve her heart healthy after the heart attach, and to be healthy to complete the HeartWalk.   Patient stated that she has stepped back from stress eating. Patient shared that since she moved and has started to settle in a routine, she has done a lot better with her eating habits. Patient shared that she is eating breakfast and lunch, which has helped reduce her frequency of snacking. Patient mentioned that she is being more conscious of what she eats and do more planning instead of grabbing anything to eat when stressed. Patient expressed the desire  to meal plan/prep more to help her manage what foods she is eating.  Patient was asked if she was keeping track of her blood pressure readings to see if there has been a change. Patient stated that Dr. Oval Linsey does want her to check it regularly and that she has not been quite consistent with checking it and writing her readings down. Discussed with patient the idea of setting a reminder in her phone after waking up to check her blood pressure. Patient stated that was something she could do. Patient was  advised to check her blood pressure after she has sat down to rest for 5-10 minutes if she has been moving around a lot before. Patient verbally expressed understanding.    Indicators of Success and Accountability:  Patient stated that she has started being more conscious of her behaviors, intentional with her time for devotion, and is learning to slow down for self-care.  Readiness: Patient is in the action phase of stress management.  Strengths and Supports: Patient is being supported by family. Patient stated that she has become more conscious and intentional. Challenges and Barriers: Patient does not foresee any challenges/barriers to implementing her action steps over the next two weeks.    Coaching Outcomes: Patient mentioned that during her praying time when she is conducting self-check-ins, she believes this would be a good time for her to practice deep breathing and think on positive things to help reduce stress even more.   Patient action steps has been revised based on the patient's decisions to increase physical activity via exercise classes and set a time to reach out for coaching and wanting to improve her routine for checking her blood pressure.  Patient was emailed information on meal planning/prepping along with a food journal to help her plan meals.     Overall Goal(s): Stress management Check blood pressure daily                                             Agreement/Action Steps:  Stress management Read morning scripture/prayer Exercise 2-3 days/week  Go to exercise class on [address] [employer] between 9am-10am Continue to organize and clean home Contact coach to work on Editor, commissioning and endurance for walking Conduct self-check-ins through prayer  Implement positive self-talk with scripture Practice gratitude reflections Practice deep breathing Utilize support system Be mindful of stress eating Meal plan/prep to manage eating habits  Check blood pressure daily Set a  reminder cell phone after waking up    Agreement/Action Steps:  Attempted: Fulfilled - Patient continues to read scripture and pray each morning. Patient conducts self-check-ins, implement positive self-talk, and practice gratitude reflections. Patient is engaging with her support system. Patient is being mindful of stress eating. Partial - Patient caught herself deep breathing at times but was not intentional about practicing deep breathing.    Not Attempted: Dropped/Revised - Patient did not set a time to walk in the mornings 2-3 times per week as planned. Patient has decided to attend exercise classes with some women from her church on Wednesdays and Fridays between 9-40m. Patient will continue to engage in organizing and cleaning her home to aid in increasing her physical activity. Patient has decided to reach out for coaching next week between Tuesday and Wednesday.

## 2022-07-10 ENCOUNTER — Ambulatory Visit (INDEPENDENT_AMBULATORY_CARE_PROVIDER_SITE_OTHER): Payer: 59 | Admitting: Podiatry

## 2022-07-10 ENCOUNTER — Encounter: Payer: Self-pay | Admitting: Podiatry

## 2022-07-10 DIAGNOSIS — E1142 Type 2 diabetes mellitus with diabetic polyneuropathy: Secondary | ICD-10-CM

## 2022-07-10 DIAGNOSIS — M79676 Pain in unspecified toe(s): Secondary | ICD-10-CM

## 2022-07-10 DIAGNOSIS — Q828 Other specified congenital malformations of skin: Secondary | ICD-10-CM | POA: Diagnosis not present

## 2022-07-10 DIAGNOSIS — B351 Tinea unguium: Secondary | ICD-10-CM

## 2022-07-13 ENCOUNTER — Ambulatory Visit (INDEPENDENT_AMBULATORY_CARE_PROVIDER_SITE_OTHER): Payer: 59 | Admitting: Family

## 2022-07-13 ENCOUNTER — Encounter (HOSPITAL_BASED_OUTPATIENT_CLINIC_OR_DEPARTMENT_OTHER): Payer: Self-pay | Admitting: Family

## 2022-07-13 ENCOUNTER — Other Ambulatory Visit (HOSPITAL_BASED_OUTPATIENT_CLINIC_OR_DEPARTMENT_OTHER): Payer: Self-pay | Admitting: Family

## 2022-07-13 VITALS — BP 138/80 | HR 68 | Ht 62.5 in | Wt 196.0 lb

## 2022-07-13 DIAGNOSIS — I25118 Atherosclerotic heart disease of native coronary artery with other forms of angina pectoris: Secondary | ICD-10-CM

## 2022-07-13 DIAGNOSIS — E785 Hyperlipidemia, unspecified: Secondary | ICD-10-CM

## 2022-07-13 DIAGNOSIS — G4733 Obstructive sleep apnea (adult) (pediatric): Secondary | ICD-10-CM

## 2022-07-13 DIAGNOSIS — I472 Ventricular tachycardia, unspecified: Secondary | ICD-10-CM

## 2022-07-13 DIAGNOSIS — R0602 Shortness of breath: Secondary | ICD-10-CM

## 2022-07-13 DIAGNOSIS — I340 Nonrheumatic mitral (valve) insufficiency: Secondary | ICD-10-CM

## 2022-07-13 DIAGNOSIS — I5042 Chronic combined systolic (congestive) and diastolic (congestive) heart failure: Secondary | ICD-10-CM

## 2022-07-13 DIAGNOSIS — R5383 Other fatigue: Secondary | ICD-10-CM

## 2022-07-13 MED ORDER — FUROSEMIDE 40 MG PO TABS: ORAL_TABLET | ORAL | 3 refills | Status: DC

## 2022-07-13 MED ORDER — PRALUENT 150 MG/ML ~~LOC~~ SOAJ: 150.0000 mg | SUBCUTANEOUS | 11 refills | Status: DC

## 2022-07-13 NOTE — Progress Notes (Unsigned)
Office Visit    Patient Name: Marie Tanner Date of Encounter: 07/13/2022  PCP:  Drue Flirt, MD   [place]  Cardiologist:  Skeet Latch, MD  Advanced Practice Provider:  No care team member to display Electrophysiologist:  None      Chief Complaint    Marie Tanner is a 66 y.o. female presents today for follow-up after medication changes  Past Medical History    Past Medical History:  Diagnosis Date   Breast cancer ([place])    Cancer ([place])    Chronic combined systolic and diastolic heart failure ([place]) 01/30/2015   Coronary artery disease    Depression    DVT (deep venous thrombosis) ([place]) 11/15   left femerol artery injury during resusciation.   GERD (gastroesophageal reflux disease)    HCAP (healthcare-associated pneumonia) 11/22/2014   Heart murmur    High cholesterol    History of blood transfusion 09/2014   related to MI   History of gout    Hypertension    Liver laceration 10/19/14   Palpitations 12/12/2020   Pulmonary edema 10/19/14   Renal disorder    Right hip pain 04/03/2021   Sleep apnea    "suppose to wear mask but I don't" (11/22/2014)   STEMI (ST elevation myocardial infarction) ([place]) 10/19/14   Type II diabetes mellitus ([place])    Urticaria    Past Surgical History:  Procedure Laterality Date   ABDOMINAL HYSTERECTOMY  ~ 5697   APPLICATION OF WOUND VAC  10/2014   "over my naval" (11/22/2014)   BREAST LUMPECTOMY WITH AXILLARY LYMPH NODE BIOPSY     BREAST SURGERY     CESAREAN SECTION  1980; [place]  09/2014   "multiple"/notes 11/22/2014   EXTRACORPOREAL CIRCULATION     Femerol artery repair Right 09/2014   pt insists it was a right femoral artery repair on 11/22/2014   LAPAROSCOPIC ABDOMINAL EXPLORATION     LEFT HEART CATH AND CORONARY ANGIOGRAPHY N/A 12/07/2017   Procedure: LEFT HEART CATH AND CORONARY ANGIOGRAPHY;  Surgeon: Leonie Man, MD;  Location: [facility];  Service: Cardiovascular;  Laterality: N/A;   LEFT VENTRICULAR ASSIST DEVICE     pt is not aware of this hx on 11/22/2014   Liver laceration repair  09/2014   related to CPR/notes 11/22/2014   RIGHT/LEFT HEART CATH AND CORONARY ANGIOGRAPHY N/A 04/21/2021   Procedure: RIGHT/LEFT HEART CATH AND CORONARY ANGIOGRAPHY;  Surgeon: Nelva Bush, MD;  Location: [facility];  Service: Cardiovascular;  Laterality: N/A;   TEE WITHOUT CARDIOVERSION N/A 03/13/2021   Procedure: TRANSESOPHAGEAL ECHOCARDIOGRAM (TEE);  Surgeon: [name], MD;  Location: [facility];  Service: Cardiology;  Laterality: N/A;   TRACHEOSTOMY  09/2014   "closed on it's own when they took it out"   [place]    Allergies  Allergies  Allergen Reactions   Dilaudid [Hydromorphone Hcl] Anaphylaxis, Itching and Swelling   Epinephrine Anxiety and Other (See Comments)    Severe anxiety   Lactose Intolerance (Gi) Diarrhea   Jardiance [Empagliflozin]     YEAST INFECTION     History of Present Illness    Marie Tanner is a 66 y.o. female with a hx of CAD s/p STEMI 2015 (DES to LM/LAD and LCx), hypertension, chronic systolic and diastolic heart failure, OSA not on CPAP, diabetes, prior DVT, breast cancer, MR last seen 05/13/2022.  In 2015 she had STEMI  and cardiac arrest.  She was cared for at [facility].  Ultimately received 2 DES to left main/LAD and LCx.  Prolonged hospitalization requiring tracheostomy.  Liver lacerated during CPR.  Required surgical repair after femoral catheterization.  Developed DVT while admitted.  1 week after discharge developed pneumonia and was admitted at [facility].  Negative Myoview 05/2015.  Underwent surgery for breast cancer 02/21/2016.  Completed radiation 07/2016.  Referred to sleep study which was abnormal but declined using CPAP and was fitted for oral airway but this was cost prohibitive.  Echo 06/2016 EF 50-55% with grade 1 diastolic dysfunction.  Repeat Myoview due to  chest pain 10/2017 EF 36% with anterior, anteroseptal, apical inferior and apical lateral defects.  LHC 11/2017 with patent stents that were heavily calcified and stepdown after the takeoff of the diagonal.  40% RI, 40% proximal LCx, 20% proximal LAD.  EF 35-45% and LVEDP elevated.  Spironolactone initiated.  ETT 10/2018 negative.  Losartan previously reduced due to dizziness.  She had chest discomfort most notable when lying down and PPI added as likely related to GERD.  Bottle switch to hydralazine and Imdur for cost.  On patient assistance for Brilinta and Praluent.  Hydralazine later increased due to elevated blood pressure.  Nuclear stress test 01/2021 EF 39%.  Monitor 12/2020 with 10 beats of NSVT.  Echo 02/2021 LVEF 34-40%, moderate to severe MR.  TEE with EF 45 to 70% and very eccentric anteriorly directed mitral regurgitation jet.  She was started on [place] with improvement in her breathing.  Jardiance stopped due to dysuria.  Referred to structural heart team with [facility] 03/2021 with patent left main and LAD stent, nonobstructive disease up to 40% in the mid LAD and left circumflex.  Filling pressures were normal and transmitral gradient normal therefore medical management recommended.  She was last seen 05/13/2022 noting headache about an hour after her medications and isosorbide discontinued.  She was out of Praluent and it was refilled.  She was recommended to monitor blood pressure at home.  Presents today for follow up. Since last seen headaches have resolved. She has noted some fatigue and shortness of breath at rest and with activity. She has had some nonproductive cough. No wheeze, cough, fever. Some orthopnea. She did take her Lasix with some improvement. But seems to be happening more often. Over the last week took Lasix once. Does note eating out more and high stress. BP at home 140s/90s and HR in the 70s.    EKGs/Labs/Other Studies Reviewed:   The following studies were  reviewed today:   CT-A Chest 08/31/2021: COMPARISON:  06/06/2015   FINDINGS: Cardiovascular: Heart size is normal. There is atherosclerotic calcification of coronary arteries. No pericardial effusion. Thoracic aorta is partially calcified but not aneurysmal. Pulmonary arteries are well opacified by contrast bolus. There is no acute pulmonary embolus.   Mediastinum/Nodes: The visualized portion of the thyroid gland has a normal appearance. No significant mediastinal, hilar, or axillary adenopathy. Esophagus is unremarkable. Postoperative changes seen in the RIGHT axilla.   Lungs/Pleura: Presumed post radiation changes in the anterior RIGHT UPPER lobe, new since prior study. No suspicious pulmonary nodules, consolidation, or pulmonary edema. Airways are patent.   Upper Abdomen: Possible layering sludge or stones within the gallbladder. Otherwise UPPER abdomen is unremarkable.   Musculoskeletal: Mild degenerative changes in the thoracic spine. No lytic or blastic lesions.   Review of the MIP images confirms the above findings.   IMPRESSION: 1. Technically adequate exam showing  no acute pulmonary embolus. 2. Post radiation changes in the RIGHT UPPER lobe of the lung. Postsurgical changes in the RIGHT axilla. 3. Possible layering sludge or stones within the gallbladder, only partially imaged.   Right/Left Heart Cath 04/21/2021: Conclusions: Nonobstructive coronary artery disease with up to 40% stenosis involving the mid LAD and mid LCx.  Overall appearance is similar to prior catheterization in 2019. Patent LMCA/LAD stent. Normal left and right heart filling pressures. Mild pulmonary hypertension. Normal to supranormal Fick cardiac output/index. Normal transmitral gradient.   Recommendations: Continue evidence-based heart failure therapy and ongoing management of mitral regurgitation per Dr. Oval Linsey and valve team. Secondary prevention of coronary artery disease.    Diagnostic Dominance: Left    TEE 03/13/21: 1. Very eccentric, anteriorly directed jet of mitral valve regurgitation  with small flail segment at P3 scallop. No systolic reversals in pulmonary  veins. Mitral valve regurgitation appears moderate-severe. PISA  quantitation with angle correction  results in ERO 0.17 cm2 with regurgitant volume 30 mL. Patient's systolic  blood pressure was approximately 90 mmHg at time of MV regurgitation  evaluation, suspect degree of regurgitation may appear worse at ambulatory  blood pressures given flail segment.   The mitral valve is abnormal. Moderate to severe mitral valve  regurgitation. No evidence of mitral stenosis.   2. Left ventricular ejection fraction, by estimation, is 45 to 50%. The  left ventricle has mildly decreased function.   3. Right ventricular systolic function is normal. The right ventricular  size is normal.   4. Left atrial size was moderately dilated. No left atrial/left atrial  appendage thrombus was detected. The LAA emptying velocity was 68 cm/s.   5. Right atrial size was moderately dilated.   6. A small pericardial effusion is present.   7. The aortic valve is normal in structure. Aortic valve regurgitation is  not visualized. No aortic stenosis is present.   8. There is Moderate (Grade III) atheroma plaque involving the transverse  and descending aorta.   9. Agitated saline contrast bubble study was positive with shunting  observed after >6 cardiac cycles suggestive of intrapulmonary shunting.   ECHO 03/04/2021:   1. Consider TEE to further evaluate mitral regugitation, very eccentric  jet which may be more severe than depicted on color Doppler.   2. Left ventricular ejection fraction, by estimation, is 35 to 40%. The  left ventricle has moderately decreased function. The left ventricle  demonstrates global hypokinesis. There is mild left ventricular  hypertrophy. Left ventricular diastolic  parameters are consistent  with Grade I diastolic dysfunction (impaired  relaxation).   3. Right ventricular systolic function is normal. The right ventricular  size is normal.   4. The mitral valve is normal in structure. Moderate to severe mitral  valve regurgitation. No evidence of mitral stenosis.   5. The aortic valve is tricuspid. There is mild calcification of the  aortic valve. There is mild thickening of the aortic valve. Aortic valve  regurgitation is not visualized. No aortic stenosis is present.   6. The inferior vena cava is normal in size with greater than 50%  respiratory variability, suggesting right atrial pressure of 3 mmHg.   Non-telemetry monitor study 01/22/2021: 30 Day Event Monitor   Quality: Fair.  Baseline artifact. Predominant rhythm: sinus rhythm                         Average heart rate: 81 bpm Max heart rate: 157 bpm Min heart  rate: 47 bpm   10 beats NSVT Patient did submit a symptom diary.  She reported shortness of breath and heart fluttering at which time sinus rhythm was noted.     01/17/2021 Stress Test : Study Highlights     The left ventricular ejection fraction is moderately decreased (30-44%). Nuclear stress EF: 39%. Blood pressure demonstrated a hypertensive response to exercise. Downsloping ST segment depression of 0.5 mm was noted in the II and aVF leads (ST deviation beginning in recovery). Defect 1: There is a medium defect of moderate severity present in the apical anterior and apex location. Defect 2: There is a medium defect of severe severity present in the mid inferior and apical inferior location. Findings consistent with prior myocardial infarction with peri-infarct ischemia. This is an intermediate risk study.   Intermediate risk stress nuclear study with evidence of previous infarction in the distal LAD artery and posterior descending artery distribution, with very limited peri-infarct ischemia.  Moderately reduced left ventricular global systolic  function. No significant change since 2018.   07/26/18: Study Conclusions   - Left ventricle: The cavity size was normal. Systolic function was   mildly reduced. The estimated ejection fraction was in the range   of 45% to 50%. Hypokinesis of the mid-apicalinferior and   inferoseptal myocardium. Doppler parameters are consistent with   abnormal left ventricular relaxation (grade 1 diastolic   dysfunction). Acoustic contrast opacification revealed no   evidence ofthrombus. - Mitral valve: There was moderate regurgitation directed   eccentrically and anteriorly. - Left atrium: The atrium was mildly dilated. - Atrial septum: No defect or patent foramen ovale was identified.   Impressions:   - Direct comparison to images from August 2917 shows no change in   wall motion pattern or overall LV EF.   LHC 12/07/17: Mid LM to Ost LAD DES Stent is 10% stenosed. -This entire segment is heavily calcified. There is a significant step down after the stent at the takeoff of the diagonal branches. Prox LAD lesion is 20% stenosed. Ost Ramus lesion is 40% stenosed. Proc Circumflex ~40%. There is moderate left ventricular systolic dysfunction. The left ventricular ejection fraction is 35-45% by visual estimate. LV end diastolic pressure is moderately elevated. 22-24 mmHg   ETT 10/2018: No ischemia.  6.2 METS on Bruce protocol.[name]   Lexiscan Myoview 11/24/17: The left ventricular ejection fraction is moderately decreased (30-44%). Nuclear stress EF: 36%. There was no ST segment deviation noted during stress. There is a large defect of severe severity present in the mid anterior, mid inferior, apical anterior, apical septal, apical inferior, apical lateral and apex location. The defect is partially reversible. This is consistent with infarct with small amount of peri infarct ischemia in the inferior and apical walls. This is a high risk study. Compared to prior study the EF has declined and there  appears to be peri infarct ischemia in the inferior wall although this could also be variations in diaphragmatic attenuation artifact.    EKG: No EKG today.  Recent Labs: 08/31/2021: ALT 18; BUN 9; Creatinine, Ser 0.76; Hemoglobin 12.6; Platelets 294; Potassium 4.2; Sodium 136  Recent Lipid Panel    Component Value Date/Time   CHOL 166 03/05/2021 1132   TRIG 69 03/05/2021 1132   HDL 56 03/05/2021 1132   CHOLHDL 3.0 03/05/2021 1132   CHOLHDL 3.1 10/05/2016 0937   VLDL 15 10/05/2016 0937   LDLCALC 97 03/05/2021 1132      Home Medications   Current Meds  Medication  Sig   acetaminophen (TYLENOL) 500 MG tablet Take 1,000 mg by mouth every 6 (six) hours as needed for moderate pain.   Alirocumab (PRALUENT) 150 MG/ML SOAJ Inject 150 mg into the skin every 14 (fourteen) days.   aspirin EC 81 MG tablet Take 1 tablet (81 mg total) by mouth daily.   blood glucose meter kit and supplies KIT by Does not apply route daily as needed. Dispense based on patient and insurance preference. Use up to four times daily as directed. (FOR ICD-9 250.00, 250.01).   BRILINTA 60 MG TABS tablet TAKE 1 TABLET (60 MG TOTAL) BY MOUTH 2 (TWO) TIMES DAILY. (Patient taking differently: Take 60 mg by mouth 2 (two) times daily.)   cholecalciferol (VITAMIN D3) 25 MCG (1000 UNIT) tablet Take 1,000 Units by mouth daily.   clotrimazole-betamethasone (LOTRISONE) cream Apply to feet and between toes bid x 6 weeks   exemestane (AROMASIN) 25 MG tablet Take 25 mg by mouth daily after breakfast.    fluticasone (FLONASE) 50 MCG/ACT nasal spray 2 sprays per nostril at night for stuffy nose at 7pm (Patient taking differently: Place 2 sprays into both nostrils daily. at 7pm)   furosemide (LASIX) 40 MG tablet TAKE 1 TABLET AS NEEDED FOR SHORTNESS OF BREATH OR WEIGHT GAIN OF 2 POUNDS IN 24 HOURS OR 5 POUNDS IN 1 WEEK (Patient taking differently: Take 40 mg by mouth daily as needed (SHORTNESS OF BREATH OR WEIGHT GAIN OF 2 POUNDS IN 24  HOURS OR 5 POUNDS IN 1 WEEK).)   glucose blood test strip Use as instructed. E11.5. To check blood glucose daily.   magnesium oxide (MAG-OX) 400 MG tablet TAKE 1 TABLET BY MOUTH EVERY DAY (Patient taking differently: Take 400 mg by mouth daily.)   metFORMIN (GLUCOPHAGE-XR) 500 MG 24 hr tablet Take 1 tablet (500 mg total) by mouth daily with breakfast.   metoprolol succinate (TOPROL-XL) 25 MG 24 hr tablet Take 1 tablet (25 mg total) by mouth daily.   nitroGLYCERIN (NITROSTAT) 0.4 MG SL tablet Place 1 tablet (0.4 mg total) under the tongue every 5 (five) minutes as needed for chest pain. max of 3 if no relief call 911   rosuvastatin (CRESTOR) 20 MG tablet Take 1 tablet by mouth 3 times per week   sacubitril-valsartan (ENTRESTO) 49-51 MG Take 1 tablet by mouth 2 (two) times daily.   spironolactone (ALDACTONE) 25 MG tablet Take 1 tablet (25 mg total) by mouth daily.   Current Facility-Administered Medications for the 07/13/22 encounter (Office Visit) with Loel Dubonnet, NP  Medication   sodium chloride flush (NS) 0.9 % injection 3 mL     Review of Systems      All other systems reviewed and are otherwise negative except as noted above.  Physical Exam    VS:  BP 138/80   Pulse 68   Ht 5' 2.5" (1.588 m)   Wt 196 lb (88.9 kg)   BMI 35.28 kg/m  , BMI Body mass index is 35.28 kg/m.  Wt Readings from Last 3 Encounters:  07/13/22 196 lb (88.9 kg)  05/13/22 195 lb 1.6 oz (88.5 kg)  04/21/22 194 lb (88 kg)     GEN: Well nourished, overweight, well developed, in no acute distress. HEENT: normal. Neck: Supple, no JVD, carotid bruits, or masses. Cardiac: RRR, no murmurs, rubs, or gallops. No clubbing, cyanosis. Non pitting bilateral pedal edema..  Radials/PT 2+ and equal bilaterally Respiratory:  Respirations regular and unlabored, clear to auscultation bilaterally. GI: Soft, nontender,  nondistended. MS: No deformity or atrophy. Skin: Warm and dry, no rash. Neuro:  Strength and  sensation are intact. Psych: Normal affect.  Assessment & Plan    CAD / HLD, LDL goal <70- Stable with no anginal symptoms. No indication for ischemic evaluation.  GDMT Brilinta, Metoprolol, Praluent. Praluent refill provided.  Combined HF / Hypertensive heart disease - EF45-50%. Volume overloaded with orthopnea, dyspnea, edema. Increase Lasix to 66m QD x 3 days then take once per week with additional dose PRN. BMP, BNP today. Low sodium diet, fluid restriction <2L, and daily weights encouraged. Educated to contact our office for weight gain of 2 lbs overnight or 5 lbs in one week. Did not tolerate Jardiance due to vaginal irritation. GDMT includes Lasix, Metoprolol, Entresto, Spironolactone.   OSA - Declined CPAp. Encouraged to follow up with dentist regarding oral airway device.   Moderate to severe MR - Evaluated by structural heart team and medical management recommended. Repeat echo 05/2023. Management of HF symptoms as above.          Disposition: Follow up  as scheduled  with TSkeet Latch MD or APP.  Signed, CLoel Dubonnet NP 07/13/2022, 9:33 AM [place]

## 2022-07-13 NOTE — Patient Instructions (Signed)
Medication Instructions:  Your physician has recommended you make the following change in your medication:   TAKE Furosemide (Lasix) to '40mg'$  daily for 3 days  THEN transition to Furosemide (Lasix) once per week with additional dose as needed for weight gain of 2 lbs overnight, 5 lbs in one week, or swelling.    *If you need a refill on your cardiac medications before your next appointment, please call your pharmacy*  Lab Work: Your physician recommends that you return for lab work today: BMP, BNP  If you have labs (blood work) drawn today and your tests are completely normal, you will receive your results only by: MyChart Message (if you have MyChart) OR A paper copy in the mail If you have any lab test that is abnormal or we need to change your treatment, we will call you to review the results.   Testing/Procedures: Your EKG today was stable compared to previous.   Follow-Up: At [facility], you and your health needs are our priority.  As part of our continuing mission to provide you with exceptional heart care, we have created designated Provider Care Teams.  These Care Teams include your primary Cardiologist (physician) and Advanced Practice Providers (APPs -  Physician Assistants and Nurse Practitioners) who all work together to provide you with the care you need, when you need it.  We recommend signing up for the patient portal called "MyChart".  Sign up information is provided on this After Visit Summary.  MyChart is used to connect with patients for Virtual Visits (Telemedicine).  Patients are able to view lab/test results, encounter notes, upcoming appointments, etc.  Non-urgent messages can be sent to your provider as well.   To learn more about what you can do with MyChart, go to [email or web address].    Your next appointment:   As scheduled   Other Instructions  Tips to Measure your Blood Pressure Correctly  Here's what you can do to ensure a correct reading:   Don't drink a caffeinated beverage or smoke during the 30 minutes before the test.  Sit quietly for five minutes before the test begins.  During the measurement, sit in a chair with your feet on the floor and your arm supported so your elbow is at about heart level.  The inflatable part of the cuff should completely cover at least 80% of your upper arm, and the cuff should be placed on bare skin, not over a shirt.  Don't talk during the measurement.   Blood pressure categories  Blood pressure category SYSTOLIC (upper number)  DIASTOLIC (lower number)  Normal Less than 120 mm Hg and Less than 80 mm Hg  Elevated 120-129 mm Hg and Less than 80 mm Hg  High blood pressure: Stage 1 hypertension 130-139 mm Hg or 80-89 mm Hg  High blood pressure: Stage 2 hypertension 140 mm Hg or higher or 90 mm Hg or higher  Hypertensive crisis (consult your doctor immediately) Higher than 180 mm Hg and/or Higher than 120 mm Hg  Source: American Heart Association and American Stroke Association. For more on getting your blood pressure under control, buy Controlling Your Blood Pressure, a Special Health Report from [facility].   Blood Pressure Log   Date   Time  Blood Pressure  Example: Nov 1 9 AM 124/78  Heart Healthy Diet Recommendations: A low-salt diet is recommended. Meats should be grilled, baked, or boiled. Avoid fried foods. Focus on lean protein sources like fish or chicken with vegetables and fruits. The American Heart Association is a Microbiologist!  American Heart Association Diet and Lifeystyle Recommendations   Exercise recommendations: The American Heart Association recommends 150 minutes of moderate intensity exercise weekly. Try 30 minutes of moderate intensity exercise 4-5 times per week. This could include walking, jogging, or swimming.  Important Information About Sugar

## 2022-07-14 ENCOUNTER — Encounter (HOSPITAL_BASED_OUTPATIENT_CLINIC_OR_DEPARTMENT_OTHER): Payer: Self-pay | Admitting: Family

## 2022-07-17 NOTE — Progress Notes (Signed)
  Subjective:  Patient ID: Marie Tanner, female    DOB: [birth year],  MRN: [id number]  Marie Tanner presents to clinic today for at risk foot care with history of diabetic neuropathy and painful porokeratotic lesion(s) b/l lower extremities and painful mycotic toenails that limit ambulation. Painful toenails interfere with ambulation. Aggravating factors include wearing enclosed shoe gear. Pain is relieved with periodic professional debridement. Painful porokeratotic lesions are aggravated when weightbearing with and without shoegear. Pain is relieved with periodic professional debridement.  She did see Dr. Stanford Tanner in Vascular Surgery. There were no acute  Last known HgA1c was 6.5%. Patient did not check blood glucose today.  New problem(s): None.   PCP is Marie Flirt, MD , and last visit was April, 2023.  Allergies  Allergen Reactions   Dilaudid [Hydromorphone Hcl] Anaphylaxis, Itching and Swelling   Epinephrine Anxiety and Other (See Comments)    Severe anxiety   Lactose Intolerance (Gi) Diarrhea   Jardiance [Empagliflozin]     YEAST INFECTION     Review of Systems: Negative except as noted in the HPI.  Objective: No changes noted in today's physical examination.  Vascular Examination: Palpable pedal pulses b/l LE. Digital hair present b/l. Skin temperature gradient WNL b/l. No varicosities b/l. CFT <3 seconds b/l LE. Trace edema noted BLE.[name]  Dermatological Examination: Pedal skin with normal turgor, texture and tone b/l. No open wounds. No interdigital macerations b/l. Toenails 1-5 b/l thickened, discolored, dystrophic with subungual debris. There is pain on palpation to dorsal aspect of nailplates. Porokeratotic lesion(s) L 5th toe, submet head 2 b/l, and submet head 3 b/l. No erythema, no edema, no drainage, no fluctuance..  Neurological Examination: Protective sensation intact with 10 gram monofilament b/l LE. Vibratory sensation intact b/l LE. Pt has subjective  symptoms of neuropathy.  Musculoskeletal Examination: Muscle strength 5/5 to all LE muscle groups b/l. Hammertoe deformity noted 2-5 b/l. Patient ambulates independent of any assistive aids.  Assessment/Plan: 1. Pain due to onychomycosis of nail   2. Porokeratosis   3. Diabetic peripheral neuropathy associated with type 2 diabetes mellitus ([place])     -Examined patient. -Patient has had ABI performed and she has seen Dr. Stanford Tanner in Vascular Surgery. She should follow up prn with him. -Patient to continue soft, supportive shoe gear daily. -Mycotic toenails 1-5 bilaterally were debrided in length and girth with sterile nail nippers and dremel without incident. -Porokeratotic lesion(s) L 5th toe, submet head 2 b/l, and submet head 3 b/l pared and enucleated with sterile scalpel blade without incident. Total number of lesions debrided=5. -Patient/POA to call should there be question/concern in the interim.   Return in about 3 months (around 10/10/2022).  Marzetta Board, DPM

## 2022-07-23 ENCOUNTER — Ambulatory Visit (HOSPITAL_BASED_OUTPATIENT_CLINIC_OR_DEPARTMENT_OTHER): Payer: 59

## 2022-07-24 ENCOUNTER — Ambulatory Visit: Payer: 59

## 2022-07-24 ENCOUNTER — Telehealth: Payer: Self-pay

## 2022-07-24 DIAGNOSIS — Z0000 Encounter for general adult medical examination without abnormal findings: Secondary | ICD-10-CM

## 2022-07-24 NOTE — Telephone Encounter (Signed)
Called patient at scheduled time. Patient forgot appointment due to having another engagement this morning. Requested to be rescheduled for 8/28 at 10:30am. Patient will be called at this time.   Marie Tanner [place]ward, [facility] Guide, Health Coach [address] [place] [address] Telephone: [phone] Email: [email or web address]

## 2022-07-27 ENCOUNTER — Telehealth: Payer: Self-pay

## 2022-07-27 ENCOUNTER — Ambulatory Visit: Payer: 59

## 2022-07-27 DIAGNOSIS — Z7189 Other specified counseling: Secondary | ICD-10-CM

## 2022-07-27 DIAGNOSIS — Z0000 Encounter for general adult medical examination without abnormal findings: Secondary | ICD-10-CM

## 2022-07-27 NOTE — Telephone Encounter (Signed)
Called patient as scheduled for a health coaching session. Left patient a message to return call to hold session today over the phone or reschedule for another date and time.    Aseel Uhde Truman Hayward [facility] Guide, Health Coach [address] [place] [address] Telephone: [phone] Email: [email or web address]

## 2022-07-27 NOTE — Telephone Encounter (Signed)
Returned patient's call. Patient was on an errand and there would not have been enough allotted time to reschedule for a session later today. Patient has been scheduled for a health coaching session at 10:30am on 8/29. Patient will be called at this time.    Enid Maultsby Truman Hayward [facility] Guide, Health Coach [address], [address] [place] [address] Telephone: [phone] Email: [email or web address]

## 2022-07-28 ENCOUNTER — Ambulatory Visit: Payer: 59

## 2022-07-28 ENCOUNTER — Telehealth: Payer: Self-pay

## 2022-07-28 DIAGNOSIS — Z0000 Encounter for general adult medical examination without abnormal findings: Secondary | ICD-10-CM

## 2022-07-28 NOTE — Telephone Encounter (Signed)
Called patient to hold health coaching appointment over the phone as rescheduled. Patient did not answer. Left a message for patient to return call to hold session or to reschedule.  Wray Goehring Truman Hayward [facility] Guide, Health Coach [address] [place] [address] Telephone: [phone] Email: [email or web address]

## 2022-08-07 ENCOUNTER — Telehealth (HOSPITAL_BASED_OUTPATIENT_CLINIC_OR_DEPARTMENT_OTHER): Payer: Self-pay

## 2022-08-07 LAB — BASIC METABOLIC PANEL
BUN/Creatinine Ratio: 18 (ref 12–28)
BUN: 13 mg/dL (ref 8–27)
CO2: 22 mmol/L (ref 20–29)
Calcium: 9.7 mg/dL (ref 8.7–10.3)
Chloride: 104 mmol/L (ref 96–106)
Creatinine, Ser: 0.72 mg/dL (ref 0.57–1.00)
Glucose: 106 mg/dL — ABNORMAL HIGH (ref 70–99)
Potassium: 5.1 mmol/L (ref 3.5–5.2)
Sodium: 140 mmol/L (ref 134–144)
eGFR: 94 mL/min/{1.73_m2} (ref >59)

## 2022-08-07 LAB — BRAIN NATRIURETIC PEPTIDE

## 2022-08-07 NOTE — Telephone Encounter (Addendum)
Results called to patient who verbalizes understanding!    ----- Message from Loel Dubonnet, NP sent at 08/07/2022  1:05 PM EDT ----- Normal kidney function, electrolytes.  Good result.  Continue current medications.

## 2022-09-17 ENCOUNTER — Encounter (HOSPITAL_BASED_OUTPATIENT_CLINIC_OR_DEPARTMENT_OTHER): Payer: Self-pay | Admitting: Cardiovascular Disease

## 2022-09-17 MED ORDER — SPIRONOLACTONE 25 MG PO TABS: 25.0000 mg | ORAL_TABLET | Freq: Every day | ORAL | 2 refills | Status: DC

## 2022-09-25 ENCOUNTER — Other Ambulatory Visit: Payer: Self-pay | Admitting: Family Medicine

## 2022-09-25 DIAGNOSIS — Z1231 Encounter for screening mammogram for malignant neoplasm of breast: Secondary | ICD-10-CM

## 2022-10-13 ENCOUNTER — Encounter: Payer: Self-pay | Admitting: Podiatry

## 2022-10-13 ENCOUNTER — Ambulatory Visit (INDEPENDENT_AMBULATORY_CARE_PROVIDER_SITE_OTHER): Payer: Commercial Managed Care - HMO | Admitting: Podiatry

## 2022-10-13 DIAGNOSIS — Q828 Other specified congenital malformations of skin: Secondary | ICD-10-CM | POA: Diagnosis not present

## 2022-10-13 DIAGNOSIS — B351 Tinea unguium: Secondary | ICD-10-CM | POA: Diagnosis not present

## 2022-10-13 DIAGNOSIS — M79609 Pain in unspecified limb: Secondary | ICD-10-CM | POA: Diagnosis not present

## 2022-10-13 DIAGNOSIS — I2510 Atherosclerotic heart disease of native coronary artery without angina pectoris: Secondary | ICD-10-CM | POA: Insufficient documentation

## 2022-10-13 DIAGNOSIS — E1142 Type 2 diabetes mellitus with diabetic polyneuropathy: Secondary | ICD-10-CM | POA: Diagnosis not present

## 2022-10-13 NOTE — Progress Notes (Signed)
  Subjective:  Patient ID: Marie Tanner, female    DOB: [birth year],  MRN: [id number]  Marie Tanner presents to clinic today for at risk foot care with history of diabetic neuropathy and painful porokeratotic lesion(s) b/l lower extremities and painful mycotic toenails that limit ambulation. Painful toenails interfere with ambulation. Aggravating factors include wearing enclosed shoe gear. Pain is relieved with periodic professional debridement. Painful porokeratotic lesions are aggravated when weightbearing with and without shoegear. Pain is relieved with periodic professional debridement.  Chief Complaint  Patient presents with   Nail Problem    DFC BG - pt does not recall  A1C - 6.9 PCP - Dr Quentin Cornwall , last OV September 2023   New problem(s): None.   PCP is Drue Flirt, MD.  Allergies  Allergen Reactions   Dilaudid [Hydromorphone Hcl] Anaphylaxis, Itching and Swelling   Epinephrine Anxiety and Other (See Comments)    Severe anxiety   Lactose Intolerance (Gi) Diarrhea   Jardiance [Empagliflozin]     YEAST INFECTION     Review of Systems: Negative except as noted in the HPI.  Objective: No changes noted in today's physical examination.  Marie Tanner is a pleasant 66 y.o. female in NAD. AAO x 3.  Vascular Examination: Palpable pedal pulses b/l LE. Digital hair present b/l. Skin temperature gradient WNL b/l. No varicosities b/l. CFT <3 seconds b/l LE. Trace edema noted BLE.[name]  Dermatological Examination: Pedal skin with normal turgor, texture and tone b/l. No open wounds. No interdigital macerations b/l. Toenails 1-5 b/l thickened, discolored, dystrophic with subungual debris. There is pain on palpation to dorsal aspect of nailplates.   Porokeratotic lesion(s) L 5th toe, submet head 2 b/l, and submet head 3 b/l. No erythema, no edema, no drainage, no fluctuance.  Neurological Examination: Protective sensation intact with 10 gram monofilament b/l LE. Vibratory  sensation intact b/l LE. Pt has subjective symptoms of neuropathy.  Musculoskeletal Examination: Muscle strength 5/5 to all LE muscle groups b/l. Hammertoe deformity noted 2-5 b/l. Patient ambulates independent of any assistive aids.  Assessment/Plan: 1. Pain due to onychomycosis of nail   2. Porokeratosis   3. Diabetic peripheral neuropathy associated with type 2 diabetes mellitus ([facility])     No orders of the defined types were placed in this encounter.   -Consent given for treatment as described below: -Toenails 1-5 b/l were debrided in length and girth with sterile nail nippers and dremel without iatrogenic bleeding.  -Porokeratotic lesion(s) L 5th toe, submet head 2 b/l, and submet head 3 b/l pared and enucleated with sterile currette without incident. Total number of lesions debrided=5. -Patient/POA to call should there be question/concern in the interim.   Return in about 3 months (around 01/13/2023).  Marzetta Board, DPM

## 2022-10-18 ENCOUNTER — Encounter: Payer: Self-pay | Admitting: Podiatry

## 2022-11-02 ENCOUNTER — Telehealth (HOSPITAL_COMMUNITY): Payer: Self-pay | Admitting: *Deleted

## 2022-11-02 NOTE — Telephone Encounter (Signed)
Reaching out to patient to offer assistance regarding upcoming cardiac imaging study; pt verbalizes understanding of appt date/time, parking situation and where to check in, and verified current allergies; name and call back number provided for further questions should they arise  Gordy Clement RN [facility] [phone] office [phone] cell  Patient denies metal but does report mild claustrophobia.

## 2022-11-03 ENCOUNTER — Other Ambulatory Visit (HOSPITAL_BASED_OUTPATIENT_CLINIC_OR_DEPARTMENT_OTHER): Payer: Self-pay | Admitting: Cardiovascular Disease

## 2022-11-03 ENCOUNTER — Ambulatory Visit (HOSPITAL_COMMUNITY)
Admission: RE | Admit: 2022-11-03 | Discharge: 2022-11-03 | Disposition: A | Discharge status: Home / Self Care | Payer: Commercial Managed Care - HMO | Source: Ambulatory Visit | Attending: Cardiovascular Disease | Admitting: Cardiovascular Disease

## 2022-11-03 DIAGNOSIS — I340 Nonrheumatic mitral (valve) insufficiency: Secondary | ICD-10-CM | POA: Diagnosis present

## 2022-11-03 MED ORDER — GADOBUTROL 1 MMOL/ML IV SOLN
10.0000 mL | Freq: Once | INTRAVENOUS | Status: AC | PRN
  Administered 2022-11-03: 10 mL via INTRAVENOUS

## 2022-11-10 ENCOUNTER — Ambulatory Visit (HOSPITAL_BASED_OUTPATIENT_CLINIC_OR_DEPARTMENT_OTHER): Payer: 59 | Admitting: Cardiovascular Disease

## 2022-11-12 ENCOUNTER — Encounter (HOSPITAL_BASED_OUTPATIENT_CLINIC_OR_DEPARTMENT_OTHER): Payer: Self-pay | Admitting: Cardiovascular Disease

## 2022-11-12 ENCOUNTER — Ambulatory Visit (HOSPITAL_BASED_OUTPATIENT_CLINIC_OR_DEPARTMENT_OTHER): Payer: Commercial Managed Care - HMO | Admitting: Cardiovascular Disease

## 2022-11-12 VITALS — BP 142/82 | HR 82 | Ht 62.5 in | Wt 198.1 lb

## 2022-11-12 DIAGNOSIS — Z9861 Coronary angioplasty status: Secondary | ICD-10-CM

## 2022-11-12 DIAGNOSIS — Z6835 Body mass index (BMI) 35.0-35.9, adult: Secondary | ICD-10-CM

## 2022-11-12 DIAGNOSIS — I472 Ventricular tachycardia, unspecified: Secondary | ICD-10-CM

## 2022-11-12 DIAGNOSIS — I5042 Chronic combined systolic (congestive) and diastolic (congestive) heart failure: Secondary | ICD-10-CM

## 2022-11-12 DIAGNOSIS — I2510 Atherosclerotic heart disease of native coronary artery without angina pectoris: Secondary | ICD-10-CM | POA: Diagnosis not present

## 2022-11-12 DIAGNOSIS — I10 Essential (primary) hypertension: Secondary | ICD-10-CM | POA: Diagnosis not present

## 2022-11-12 DIAGNOSIS — E119 Type 2 diabetes mellitus without complications: Secondary | ICD-10-CM

## 2022-11-12 DIAGNOSIS — Z5181 Encounter for therapeutic drug level monitoring: Secondary | ICD-10-CM

## 2022-11-12 MED ORDER — SACUBITRIL-VALSARTAN 97-103 MG PO TABS: 1.0000 | ORAL_TABLET | Freq: Two times a day (BID) | ORAL | 5 refills | Status: DC

## 2022-11-12 NOTE — Progress Notes (Signed)
Cardiology Office Note  Date:  11/12/2022   ID:  Marie Tanner, DOB 1956/08/04, MRN [id number]  PCP:  Drue Flirt, MD  Cardiologist:   Skeet Latch, MD  Oncologist: Samuel Bouche, MD Radiation Oncologist: Ladona Horns, MD Surgical Oncologist: Nancie Neas, MD  No chief complaint on file.   History of Present Illness: Marie Tanner is a 66 y.o. female with CAD s/p STEMI, hypertension, chronic systolic and diastolic heart failure, OSA not on CPAP, diabetes, prior DVT, breast cancer, and mitral regurgitation who presents for follow up.  In 2015 she had a STEMI and cardiac arrest. She was cared for at [facility]. She ultimately received 2 DES (LM/LAD and LCx).  She had a prolonged hospitalization requiring tracheostomy.  Her liver was lacerated during CPR.  She required surgical repair after femoral catheterization.  She also developed a DVT in the hospital.  One week after discharge she developed pneumonia and was admitted at [facility].  She has been seen in the hospital twice for chest pain and ruled out for MI.  She had a negative Myoview 05/2015.  She also had negative CT scans for PE.  Marie Tanner also underwent surgery for breast cancer on 02/21/16.  She completed her radiation in September 2017.  She was referred for sleep study which was abnormal.  However, she declined using the CPAP device and is in the process of being fitted for an oral airway.  Marie Tanner had an echo 06/2016 that showed LVEF 50-55% with grade 1 diastolic dysfunction.    Marie Tanner reported an episode of chest pain that occurred while driving.  She was referred for [facility] 11/24/17 that revealed LVEF 36% with anterior, anteroseptal, apical inferior and apical lateral defects.  There was concern for infarct with peri-infarct ischemia in the inferior and apical walls.  She underwent LHC 11/2017 that revealed patent stents that were heavily calcified and there was a step down after the takeoff of  the diagonal.  She had 40% RI, 40% proximal LCx and 20% prox LAD.  LVEF was 35-45% and LVEDP was elevated.  BiDil was added to her regimen for afterload reduction and poorly controlled hypertension.  She reported increased dyspnea on exertion and edema.  Lasix was increased for 2 days.  She was also referred for an echocardiogram 06/2018 that revealed LVEF 45 to 50% with hypokinesis of the mid to apical inferior and inferoseptal myocardium that was relatively unchanged from prior.  She was also started on spironolactone.  She had atypical chest pain and was referred for an ETT 10/2018 that was negative.  She achieved 6.2 METS on a Bruce protocol.    Marie Tanner reported episodes of dizziness.  Losartan was reduced to 12.5 mg she also had persistent episodes of chest discomfort that were atypical and occurred when lying down.  Thought to be secondary to GERD and her PPI was restarted.  Marie Tanner was struggling due to cost of medications.  She had been out of her BiDil, ticagrelor, and Praluent.  BiDil was switched to hydralazine and Imdur.  Her BP was improving.  It had been as high as 170-180s. She was started on patient assistance for ticagrelor and Praluent.    Marie Tanner BP was above goal, so hydralazine was increased. She called the office 01/19/2021 with chest pain and shortness of breath. She was referred for a nuclear stress test that revealed LVEF 39% with concern for infarct with peri-infarct ischemia. She had a heart monitor 2/22  with 10 beats of NSVT. She also had an echo 03/19/2021 with LVEF 34-40% and moderate to severe mitral regurgitation.  She had an echo 02/2021 that revealed LVEF 35 to 40% and very eccentric mitral regurgitation.  Her left atrium was normal in size.  She was referred for TEE that revealed LVEF 45 to 50% with a very eccentric, anteriorly directed mitral regurgitant jet.  It was thought to be due to a small flail P3 scallop.  There is no evidence of pulmonary flow  reversal.  Based on PISA, the ERO was 0.17 cm with a regurgitant volume of 30 mL.  However this was thought to be underestimated in the setting of anesthesia and an eccentric jet.  Overall it was thought to be moderate to severe.  She was started on both [place].  She noted an improvement in her breathing. But Jardiance was stopped due to dysuria. Entresto was increased. She was referred to the structural heart team for evaluation of her valve. She had a R/L Heart Cath 04/21/2021 that showed patent left main and LAD stent. She had non obstructive disease up to 40% in the mid lad and left circumflex. Her filling pressures were normal and her trans-mitral gradient was normal, therefore medical management was recommended.  Marie Tanner saw Dr. Stanford Breed on 03/2022 for her lower extremity edema.  He recommended compression and elevation of her legs for her lower extremity edema.  He did not feel that she needed any intervention for PAD given her lack of symptoms.  He felt that her noncompressible arteries were consistent with diabetes.  At the last visit she noted that her breathing was improving with exercise.  She noted some headaches and Imdur was discontinued.  She had a cardiac MRI 10/2022 that revealed late gadolinium enhancement in the apical anterior and mid to apical inferior walls which was consistent with an LAD infarct.  However the apex was spared which argues against it.  There are also focal areas of subendocardial LGE suggesting infarct in the basal anteroseptum which was not a coronary distribution.  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  Cardiac sarcoid could not be ruled out.  LVEF was 35% and she had mild mitral regurgitation with a regurgitant fraction of 16%.  Marie Tanner reports having difficulty sleeping for the past couple of days due to caffeine intake, which she does not usually consume. She mentions that caffeine  typically affects her heart and causes chest pain, but she only experienced a mild episode that resolved on its own. The patient states her breathing has been generally good, with some shortness of breath during extensive walking.  Marie Tanner admits to not exercising as much as she should and has not been attending the gym recently. 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 The patient denies any recent chest pain or pressure and confirms that stopping nitroglycerin has helped with her headaches. She reports her blood pressure has been running between 130/80 and 150/90 mmHg, which she attributes to a lack of exercise and weight gain.  The patient had her A1C checked a few months ago, which was 6.8. She has not been prescribed Ozempic but is open to discussing it with a pharmacist.  Past Medical History:  Diagnosis Date   Breast cancer ([place])    Cancer ([place])    Chronic combined systolic and diastolic heart failure ([place]) 01/30/2015   Depression  DVT (deep venous thrombosis) ([place]) 09/2014   left femerol artery injury during resusciation.   GERD (gastroesophageal reflux disease)    HCAP (healthcare-associated pneumonia) 11/22/2014   Heart murmur    High cholesterol    History of blood transfusion 09/2014   related to MI   History of gout    Hypertension    Liver laceration 10/19/2014   Palpitations 12/12/2020   Pulmonary edema 10/19/2014   Renal disorder    Renal disorder    Right hip pain 04/03/2021   Sleep apnea    "suppose to wear mask but I don't" (11/22/2014)   STEMI (ST elevation myocardial infarction) ([place]) 10/19/2014   Type II diabetes mellitus ([place])    Urticaria     Past Surgical History:  Procedure Laterality Date   ABDOMINAL HYSTERECTOMY  ~ 4403   APPLICATION OF WOUND VAC  10/2014   "over my naval" (11/22/2014)   BREAST LUMPECTOMY WITH AXILLARY LYMPH NODE BIOPSY     BREAST SURGERY     CESAREAN SECTION  1980; [place]  09/2014   "multiple"/notes 11/22/2014   EXTRACORPOREAL CIRCULATION     Femerol artery repair Right 09/2014   pt insists it was a right femoral artery repair on 11/22/2014   LAPAROSCOPIC ABDOMINAL EXPLORATION     LEFT HEART CATH AND CORONARY ANGIOGRAPHY N/A 12/07/2017   Procedure: LEFT HEART CATH AND CORONARY ANGIOGRAPHY;  Surgeon: Leonie Man, MD;  Location: [facility];  Service: Cardiovascular;  Laterality: N/A;   LEFT VENTRICULAR ASSIST DEVICE     pt is not aware of this hx on 11/22/2014   Liver laceration repair  09/2014   related to CPR/notes 11/22/2014   RIGHT/LEFT HEART CATH AND CORONARY ANGIOGRAPHY N/A 04/21/2021   Procedure: RIGHT/LEFT HEART CATH AND CORONARY ANGIOGRAPHY;  Surgeon: Nelva Bush, MD;  Location: [place] [facility];  Service: Cardiovascular;  Laterality: N/A;   TEE WITHOUT CARDIOVERSION N/A 03/13/2021   Procedure: TRANSESOPHAGEAL ECHOCARDIOGRAM (TEE);  Surgeon: [name], MD;  Location: [facility];  Service: Cardiology;  Laterality: N/A;   TRACHEOSTOMY  09/2014   "closed on it's own when they took it out"   [place]     Current Outpatient Medications  Medication Sig Dispense Refill   acetaminophen (TYLENOL) 500 MG tablet Take 1,000 mg by mouth every 6 (six) hours as needed for moderate pain.     Alirocumab (PRALUENT) 150 MG/ML SOAJ Inject 150 mg into the skin every 14 (fourteen) days. 2 mL 11   aspirin EC 81 MG tablet Take 1 tablet (81 mg total) by mouth daily.     blood glucose meter kit and supplies KIT by Does not apply route daily as needed. Dispense based on patient and insurance preference. Use up to four times daily as directed. (FOR ICD-9 250.00, 250.01).     BRILINTA 60 MG TABS tablet TAKE 1 TABLET (60 MG TOTAL) BY MOUTH 2 (TWO) TIMES DAILY. (Patient taking differently: Take 60 mg by mouth 2 (two) times daily.) 180 tablet 2   cholecalciferol (VITAMIN D3) 25 MCG (1000 UNIT) tablet Take 1,000 Units by mouth  daily.     clotrimazole-betamethasone (LOTRISONE) cream Apply to feet and between toes bid x 6 weeks 45 g 1   exemestane (AROMASIN) 25 MG tablet Take 25 mg by mouth daily after breakfast.      fluticasone (FLONASE) 50 MCG/ACT nasal spray 2 sprays per nostril at night for stuffy nose  at 7pm (Patient taking differently: Place 2 sprays into both nostrils daily. at 7pm) 18.2 mL 5   furosemide (LASIX) 40 MG tablet TAKE 1 TABLET ONCE PER WEEK WITH ADDITIONAL TABLET AS NEEDED FOR SHORTNESS OF BREATH OR WEIGHT GAIN OF 2 POUNDS IN 24 HOURS OR 5 POUNDS IN 1 WEEK 30 tablet 3   glipiZIDE (GLUCOTROL XL) 2.5 MG 24 hr tablet Take 1.25 mg by mouth daily with breakfast.     glucose blood test strip Use as instructed. E11.5. To check blood glucose daily. 100 each 12   magnesium oxide (MAG-OX) 400 MG tablet TAKE 1 TABLET BY MOUTH EVERY DAY (Patient taking differently: Take 400 mg by mouth daily.) 90 tablet 0   metoprolol succinate (TOPROL-XL) 25 MG 24 hr tablet Take 1 tablet (25 mg total) by mouth daily. 90 tablet 3   nitroGLYCERIN (NITROSTAT) 0.4 MG SL tablet Place 1 tablet (0.4 mg total) under the tongue every 5 (five) minutes as needed for chest pain. max of 3 if no relief call 911 25 tablet PRN   rosuvastatin (CRESTOR) 20 MG tablet Take 1 tablet by mouth 3 times per week 30 tablet 6   spironolactone (ALDACTONE) 25 MG tablet Take 1 tablet (25 mg total) by mouth daily. 90 tablet 2   Current Facility-Administered Medications  Medication Dose Route Frequency Provider Last Rate Last Admin   sodium chloride flush (NS) 0.9 % injection 3 mL  3 mL Intravenous Q12H Skeet Latch, MD        Allergies:   Dilaudid [hydromorphone hcl], Epinephrine, Lactose intolerance (gi), and Jardiance [empagliflozin]    Social History:  The patient  reports that she has never smoked. She has never used smokeless tobacco. She reports current alcohol use. She reports that she does not use drugs.   Family History:  The patient's  family history includes Alzheimer's disease in her father; Asthma in her daughter; COPD in her mother; Diabetes in her brother and mother; Heart attack in her father; Heart disease in her father; Hypertension in her mother.    ROS:   Please see the history of present illness. (+) Chest discomfort (+) Fatigue (+) LE edema All other systems are reviewed and negative.    PHYSICAL EXAM: VS:  BP (!) 142/82 (BP Location: Left Arm, Patient Position: Sitting, Cuff Size: Large)   Pulse 82   Ht 5' 2.5" (1.588 m)   Wt 198 lb 1.6 oz (89.9 kg)   SpO2 99%   BMI 35.66 kg/m  , BMI Body mass index is 35.66 kg/m. GENERAL:  Well appearing HEENT: Pupils equal round and reactive, fundi not visualized, oral mucosa unremarkable NECK:  No jugular venous distention, waveform within normal limits, carotid upstroke brisk and symmetric, no bruits LUNGS:  Clear to auscultation bilaterally HEART:  RRR.  PMI not displaced or sustained, S1 and S2 within normal limits, no S3, no S4, no clicks, no rubs, III/VI systolic murmur anteriorly ABD:  Flat, positive bowel sounds normal in frequency in pitch, no bruits, no rebound, no guarding, no midline pulsatile mass, no hepatomegaly, no splenomegaly EXT:  2 plus pulses throughout, no edema, no cyanosis no clubbing SKIN:  No rashes no nodules NEURO:  Cranial nerves II through XII grossly intact, motor grossly intact throughout PSYCH:  Cognitively intact, oriented to person place and time   EKG:  11/06/2021: EKG was not ordered. 03/06/2021: EKG was not ordered. 03/12/2020: Sinus rhythm.  Rate 81 bpm.  Prior inferior infarct. 02/08/2019: Sinus rhythm.  Rate 80  bpm.  Prior inferior infarct. 07/22/18: Sinu srhythm.  Rate 90 bpm.  Prior inferior infarct.   11/16/17: Sinus rhythm.  Rate 87 bpm.  Prior inferior infarct.  10/05/16: Sinus rhythm. Rate 85 bpm. Prior inferior infarct. 02/11/16: Sinus arrhythmia.  Rate 71 bpm. Non-specific ST-T changes.  CT-A Chest  08/31/2021: COMPARISON:  06/06/2015   FINDINGS: Cardiovascular: Heart size is normal. There is atherosclerotic calcification of coronary arteries. No pericardial effusion. Thoracic aorta is partially calcified but not aneurysmal. Pulmonary arteries are well opacified by contrast bolus. There is no acute pulmonary embolus.   Mediastinum/Nodes: The visualized portion of the thyroid gland has a normal appearance. No significant mediastinal, hilar, or axillary adenopathy. Esophagus is unremarkable. Postoperative changes seen in the RIGHT axilla.   Lungs/Pleura: Presumed post radiation changes in the anterior RIGHT UPPER lobe, new since prior study. No suspicious pulmonary nodules, consolidation, or pulmonary edema. Airways are patent.   Upper Abdomen: Possible layering sludge or stones within the gallbladder. Otherwise UPPER abdomen is unremarkable.   Musculoskeletal: Mild degenerative changes in the thoracic spine. No lytic or blastic lesions.   Review of the MIP images confirms the above findings.   IMPRESSION: 1. Technically adequate exam showing no acute pulmonary embolus. 2. Post radiation changes in the RIGHT UPPER lobe of the lung. Postsurgical changes in the RIGHT axilla. 3. Possible layering sludge or stones within the gallbladder, only partially imaged.  Right/Left Heart Cath 04/21/2021: Conclusions: Nonobstructive coronary artery disease with up to 40% stenosis involving the mid LAD and mid LCx.  Overall appearance is similar to prior catheterization in 2019. Patent LMCA/LAD stent. Normal left and right heart filling pressures. Mild pulmonary hypertension. Normal to supranormal Fick cardiac output/index. Normal transmitral gradient.   Recommendations: Continue evidence-based heart failure therapy and ongoing management of mitral regurgitation per Dr. Oval Linsey and valve team. Secondary prevention of coronary artery disease.  Diagnostic Dominance: Left   TEE  03/13/21: 1. Very eccentric, anteriorly directed jet of mitral valve regurgitation  with small flail segment at P3 scallop. No systolic reversals in pulmonary  veins. Mitral valve regurgitation appears moderate-severe. PISA  quantitation with angle correction  results in ERO 0.17 cm2 with regurgitant volume 30 mL. Patient's systolic  blood pressure was approximately 90 mmHg at time of MV regurgitation  evaluation, suspect degree of regurgitation may appear worse at ambulatory  blood pressures given flail segment.   The mitral valve is abnormal. Moderate to severe mitral valve  regurgitation. No evidence of mitral stenosis.   2. Left ventricular ejection fraction, by estimation, is 45 to 50%. The  left ventricle has mildly decreased function.   3. Right ventricular systolic function is normal. The right ventricular  size is normal.   4. Left atrial size was moderately dilated. No left atrial/left atrial  appendage thrombus was detected. The LAA emptying velocity was 68 cm/s.   5. Right atrial size was moderately dilated.   6. A small pericardial effusion is present.   7. The aortic valve is normal in structure. Aortic valve regurgitation is  not visualized. No aortic stenosis is present.   8. There is Moderate (Grade III) atheroma plaque involving the transverse  and descending aorta.   9. Agitated saline contrast bubble study was positive with shunting  observed after >6 cardiac cycles suggestive of intrapulmonary shunting.  ECHO 03/04/2021:   1. Consider TEE to further evaluate mitral regugitation, very eccentric  jet which may be more severe than depicted on color Doppler.   2. Left ventricular  ejection fraction, by estimation, is 35 to 40%. The  left ventricle has moderately decreased function. The left ventricle  demonstrates global hypokinesis. There is mild left ventricular  hypertrophy. Left ventricular diastolic  parameters are consistent with Grade I diastolic dysfunction  (impaired  relaxation).   3. Right ventricular systolic function is normal. The right ventricular  size is normal.   4. The mitral valve is normal in structure. Moderate to severe mitral  valve regurgitation. No evidence of mitral stenosis.   5. The aortic valve is tricuspid. There is mild calcification of the  aortic valve. There is mild thickening of the aortic valve. Aortic valve  regurgitation is not visualized. No aortic stenosis is present.   6. The inferior vena cava is normal in size with greater than 50%  respiratory variability, suggesting right atrial pressure of 3 mmHg.  Non-telemetry monitor study 01/22/2021: 30 Day Event Monitor   Quality: Fair.  Baseline artifact. Predominant rhythm: sinus rhythm                         Average heart rate: 81 bpm Max heart rate: 157 bpm Min heart rate: 47 bpm   10 beats NSVT Patient did submit a symptom diary.  She reported shortness of breath and heart fluttering at which time sinus rhythm was noted.    01/17/2021 Stress Test : Study Highlights    The left ventricular ejection fraction is moderately decreased (30-44%). Nuclear stress EF: 39%. Blood pressure demonstrated a hypertensive response to exercise. Downsloping ST segment depression of 0.5 mm was noted in the II and aVF leads (ST deviation beginning in recovery). Defect 1: There is a medium defect of moderate severity present in the apical anterior and apex location. Defect 2: There is a medium defect of severe severity present in the mid inferior and apical inferior location. Findings consistent with prior myocardial infarction with peri-infarct ischemia. This is an intermediate risk study.   Intermediate risk stress nuclear study with evidence of previous infarction in the distal LAD artery and posterior descending artery distribution, with very limited peri-infarct ischemia.  Moderately reduced left ventricular global systolic function. No significant change since  2018.  07/26/18: Study Conclusions   - Left ventricle: The cavity size was normal. Systolic function was   mildly reduced. The estimated ejection fraction was in the range   of 45% to 50%. Hypokinesis of the mid-apicalinferior and   inferoseptal myocardium. Doppler parameters are consistent with   abnormal left ventricular relaxation (grade 1 diastolic   dysfunction). Acoustic contrast opacification revealed no   evidence ofthrombus. - Mitral valve: There was moderate regurgitation directed   eccentrically and anteriorly. - Left atrium: The atrium was mildly dilated. - Atrial septum: No defect or patent foramen ovale was identified.   Impressions:   - Direct comparison to images from August 2917 shows no change in   wall motion pattern or overall LV EF.  LHC 12/07/17: Mid LM to Ost LAD DES Stent is 10% stenosed. -This entire segment is heavily calcified. There is a significant step down after the stent at the takeoff of the diagonal branches. Prox LAD lesion is 20% stenosed. Ost Ramus lesion is 40% stenosed. Proc Circumflex ~40%. There is moderate left ventricular systolic dysfunction. The left ventricular ejection fraction is 35-45% by visual estimate. LV end diastolic pressure is moderately elevated. 22-24 mmHg  ETT 10/2018: No ischemia.  6.2 METS on Bruce protocol.[name]  Lexiscan Myoview 11/24/17: The left ventricular  ejection fraction is moderately decreased (30-44%). Nuclear stress EF: 36%. There was no ST segment deviation noted during stress. There is a large defect of severe severity present in the mid anterior, mid inferior, apical anterior, apical septal, apical inferior, apical lateral and apex location. The defect is partially reversible. This is consistent with infarct with small amount of peri infarct ischemia in the inferior and apical walls. This is a high risk study. Compared to prior study the EF has declined and there appears to be peri infarct ischemia in the  inferior wall although this could also be variations in diaphragmatic attenuation artifact.  Recent Labs: 07/13/2022: BNP CANCELED; BUN 13; Creatinine, Ser 0.72; Potassium 5.1; Sodium 140    Lipid Panel    Component Value Date/Time   CHOL 166 03/05/2021 1132   TRIG 69 03/05/2021 1132   HDL 56 03/05/2021 1132   CHOLHDL 3.0 03/05/2021 1132   CHOLHDL 3.1 10/05/2016 0937   VLDL 15 10/05/2016 0937   LDLCALC 97 03/05/2021 1132    Wt Readings from Last 3 Encounters:  11/12/22 198 lb 1.6 oz (89.9 kg)  07/13/22 196 lb (88.9 kg)  05/13/22 195 lb 1.6 oz (88.5 kg)     ASSESSMENT AND PLAN:  No problem-specific Assessment & Plan notes found for this encounter.   # CAD s/p STEMI: # Chronic angina: # Hyperlipidemia:  # NSVT: Currently denies angina.  Most recent nuclear stress 12/2020 with LVEF 39% with prior infarct in the distal LAD and PDA with very limited peri-infarct ischemia.  Marie Tanner had an ST elevation myocardial infarction with stents placed in the LAD/left main and left circumflex arteries.  She is on low dose ticagrelor indefinitely.  LHC 11/2017 showed patent stents with 20-40% lesions.  She has not had any angina.  Imdur was discontinued and she has been stable.  She was previously off her Praluent but it has been restarted.  Repeat lipids and a CMP.  Her LDL goal is less than 70.  Continue aspirin, ticagrelor, and metoprolol.  Continue rosuvastatin and Praluent.     # Chronic systolic and diastolic heart failure: # Hypertensive heart disease:  LVEF 35 to 40%.    She did not tolerate Jardiance due to vaginal irritation.  Blood pressure has been elevated both here and at home.  Increase Entresto to 97/103 mg.  Check a CMP in 1 week.  Continue metoprolol.  Continue spironolactone.  Recent cardiac MRI was concerning for mixed ischemic and nonischemic cardiomyopathy.  There is also concern for sarcoid based on the pattern of scarring.  We will get a cardiac PET to evaluate for  sarcoidosis.  Volume status is stable.  Continue Lasix.   # OSA: Oral airway device.    # Shortness of breath:  # Mild MR:  # Extracardiac shunt:  On TEE there was a late positive bubble study.  This is concerning for an intrapulmonary shunt.  Her last chest imaging was in 2020 and there was no evidence of this.  She has no polycythemia and no right heart dysfunction or pulm elevated pulmonary pressures.  Chest CTA 08/2021 did not reveal any vascular abnormalities.  She has been evaluated by the structural heart team and medical management was recommended for now for her moderate to severe MR. She just had a cardiac MRI that revealed that her mitral regurgitation was actually only mild.   Current medicines are reviewed at length with the patient today.  The patient does not have concerns regarding medicines.  The following changes have been made: stop Imdur  Labs/ tests ordered today include:   No orders of the defined types were placed in this encounter.    Disposition:   FU with Shaun Runyon C. Oval Linsey, MD, [facility] in 6 months.  APP 2 months    Signed, Lucyann Romano C. Oval Linsey, MD, [facility]  11/12/2022 9:11 AM    [place]

## 2022-11-12 NOTE — Patient Instructions (Signed)
Medication Instructions:  INCREASE YOUR ENTRESTO TO 97-103 MG TWICE A DAY   *If you need a refill on your cardiac medications before your next appointment, please call your pharmacy*  Lab Work: FASTING LP/CMET IN 1 WEEK   If you have labs (blood work) drawn today and your tests are completely normal, you will receive your results only by: [place] (if you have MyChart) OR A paper copy in the mail If you have any lab test that is abnormal or we need to change your treatment, we will call you to review the results.  Testing/Procedures: CARDIAC PET SCAN   Follow-Up: At [facility], you and your health needs are our priority.  As part of our continuing mission to provide you with exceptional heart care, we have created designated Provider Care Teams.  These Care Teams include your primary Cardiologist (physician) and Advanced Practice Providers (APPs -  Physician Assistants and Nurse Practitioners) who all work together to provide you with the care you need, when you need it.  We recommend signing up for the patient portal called "MyChart".  Sign up information is provided on this After Visit Summary.  MyChart is used to connect with patients for Virtual Visits (Telemedicine).  Patients are able to view lab/test results, encounter notes, upcoming appointments, etc.  Non-urgent messages can be sent to your provider as well.   To learn more about what you can do with MyChart, go to [email or web address].    Your next appointment:   4 month(s)  The format for your next appointment:   In Person  Provider:   Skeet Latch, MD    [place]

## 2022-11-27 LAB — LIPID PANEL
Chol/HDL Ratio: 3.4 ratio (ref 0.0–4.4)
Cholesterol, Total: 197 mg/dL (ref 100–199)
HDL: 58 mg/dL (ref >39)
LDL Chol Calc (NIH): 123 mg/dL — ABNORMAL HIGH (ref 0–99)
Triglycerides: 90 mg/dL (ref 0–149)
VLDL Cholesterol Cal: 16 mg/dL (ref 5–40)

## 2022-11-27 LAB — COMPREHENSIVE METABOLIC PANEL
ALT: 16 IU/L (ref 0–32)
AST: 20 IU/L (ref 0–40)
Albumin/Globulin Ratio: 1.2 (ref 1.2–2.2)
Albumin: 4.4 g/dL (ref 3.9–4.9)
Alkaline Phosphatase: 83 IU/L (ref 44–121)
BUN/Creatinine Ratio: 16 (ref 12–28)
BUN: 13 mg/dL (ref 8–27)
Bilirubin Total: 0.5 mg/dL (ref 0.0–1.2)
CO2: 21 mmol/L (ref 20–29)
Calcium: 9.9 mg/dL (ref 8.7–10.3)
Chloride: 103 mmol/L (ref 96–106)
Creatinine, Ser: 0.8 mg/dL (ref 0.57–1.00)
Globulin, Total: 3.8 g/dL (ref 1.5–4.5)
Glucose: 93 mg/dL (ref 70–99)
Potassium: 5.2 mmol/L (ref 3.5–5.2)
Sodium: 139 mmol/L (ref 134–144)
Total Protein: 8.2 g/dL (ref 6.0–8.5)
eGFR: 82 mL/min/{1.73_m2} (ref >59)

## 2022-11-28 ENCOUNTER — Encounter (HOSPITAL_BASED_OUTPATIENT_CLINIC_OR_DEPARTMENT_OTHER): Payer: Self-pay

## 2022-12-02 ENCOUNTER — Telehealth (HOSPITAL_BASED_OUTPATIENT_CLINIC_OR_DEPARTMENT_OTHER): Payer: Self-pay | Admitting: *Deleted

## 2022-12-02 DIAGNOSIS — E785 Hyperlipidemia, unspecified: Secondary | ICD-10-CM

## 2022-12-02 DIAGNOSIS — Z5181 Encounter for therapeutic drug level monitoring: Secondary | ICD-10-CM

## 2022-12-02 DIAGNOSIS — I10 Essential (primary) hypertension: Secondary | ICD-10-CM

## 2022-12-02 NOTE — Telephone Encounter (Signed)
Advised of results, she has not been taking the Praulent Confirmed with pharmacy has on file Patient will get filled and if needs patient assistance will print out forms and bring to office once she has completed her portion.   Mailed lab slips to repeat once she has been on for 2 months

## 2022-12-02 NOTE — Telephone Encounter (Signed)
-----   Message from Loel Dubonnet, NP sent at 11/28/2022  2:10 PM EST ----- Normal kidneys, liver, electrolytes. LDL not at goal of <70. Please ensure taking Rosuvastatin and Praluent as prescribed. Dr. Blenda Mounts note does detail interruption in Praluent but that it had been resumed.  If has completed <6 doses of Praluent or not taking Rosuvastatin - take as prescribed and repeat FLP/LFT in 2 months.  If completed >6 doses of Praluent and taking Rosuvastatin regularly - please ensure was fasting for labs and repeat if not.

## 2022-12-08 ENCOUNTER — Ambulatory Visit
Admission: RE | Admit: 2022-12-08 | Discharge: 2022-12-08 | Disposition: A | Discharge status: Home / Self Care | Payer: Commercial Managed Care - HMO | Source: Ambulatory Visit | Attending: Family Medicine | Admitting: Family Medicine

## 2022-12-08 DIAGNOSIS — Z1231 Encounter for screening mammogram for malignant neoplasm of breast: Secondary | ICD-10-CM

## 2022-12-11 ENCOUNTER — Ambulatory Visit (INDEPENDENT_AMBULATORY_CARE_PROVIDER_SITE_OTHER): Payer: Commercial Managed Care - HMO | Admitting: Pharmacist Clinician (PhC)/ Clinical Pharmacy Specialist

## 2022-12-11 DIAGNOSIS — Z6836 Body mass index (BMI) 36.0-36.9, adult: Secondary | ICD-10-CM

## 2022-12-11 DIAGNOSIS — E785 Hyperlipidemia, unspecified: Secondary | ICD-10-CM | POA: Diagnosis not present

## 2022-12-11 NOTE — Patient Instructions (Signed)
I will reach out to you in the first week of February to get your new insurance information.  TIPS FOR SUCCESS Write down the reasons why you want to lose weight and post it in a place where you'll see it often. Start small and work your way up. Keep in mind that it takes time to achieve goals, and small steps add up. Any additional movements help to burn calories. Taking the stairs rather than the elevator and parking at the far end of your parking lot are easy ways to start. Brisk walking for at least 30 minutes 4 or more days of the week is an excellent goal to work toward  [place] Did you know that it can take 15 minutes or more for your brain to receive the message that you've eaten? That means that, if you eat less food, but consume it slower, you may still feel satisfied. Eating a lot of fruits and vegetables can also help you feel fuller. Eat off of smaller plates so that moderate portions don't seem too small  TITRATION PLAN If Ozempic - start with 0.25 mg once weekly, then increasing every 4 weeks to 0.5 mg, 1 mg and 2 mg doses If Mounjaro - start with 2.5 mg once weekly, then increase every 4 weeks to 5 mg, 7.5 mg 10 mg 12.5 mg and 15 mg  Follow up 3 months after starting.  If you have any questions or concerns, please reach out to [place].  Tytan Sandate/Chris at [phone].  [place]

## 2022-12-11 NOTE — Progress Notes (Signed)
Office Visit    Patient Name: Marie Tanner Date of Encounter: 12/11/2022  Primary Care Provider:  Drue Flirt, MD Primary Cardiologist:  Marie Latch, MD  Chief Complaint    Weight management  Significant Past Medical History   CAD 2015 STEMI, DES x 2  HTN Still titrating HF meds to control  CHF EF 35-40% by echo 6/23; on Entresto, metoprolol, spironolactone  Hyperlipidemia Will restart Praluent in 2024  OSA No CPAP, was being fitted for airway appliance  DM2 12/23 A1c 6.3, previously as high as 7 (2021), on glipizide 1.25 mg  DVT Provoked, after STEMI hospitalization (had liver laceration in CPR and surgical repair after femoral catheterization)  Breast cancer Finished radiation 2017    Allergies  Allergen Reactions   Dilaudid [Hydromorphone Hcl] Anaphylaxis, Itching and Swelling   Epinephrine Anxiety and Other (See Comments)    Severe anxiety   Lactose Intolerance (Gi) Diarrhea   Jardiance [Empagliflozin]     YEAST INFECTION     History of Present Illness    Marie Tanner is a 67 y.o. female patient of Dr Marie Tanner is in the office today to discuss options for weight loss management.  She as a strong history of cardiac disease including cardiac arrest with CPR that left her with a laceration in the liver and DVT while recovering.  She is doing well now, but has struggled for some time to lose weight.    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   Social History:   Tobacco:no  Alcohol:no  Caffeine: stopped caffeine due to palpitations, does decaf coffee  [place] Assessment  Do you ever forget to take your medication? '[x]'$ Yes - occasionally '[]'$ No  Do you ever skip doses due to side effects? '[]'$ Yes '[x]'$ No  Do you have trouble affording your medicines? '[x]'$ Yes - Praluent '[]'$ No  Are you ever unable to pick up your medication due to transportation difficulties? '[]'$ Yes '[x]'$ No  Do you ever stop taking your medications because you don't believe they are helping? '[]'$ Yes '[x]'$ No    Accessory Clinical Findings    Lab Results  Component Value Date   CREATININE 0.80 11/26/2022   BUN 13 11/26/2022   NA 139 11/26/2022   K 5.2 11/26/2022   CL 103 11/26/2022   CO2 21 11/26/2022   Lab Results  Component Value Date   ALT 16 11/26/2022   AST 20 11/26/2022   ALKPHOS 83 11/26/2022   BILITOT 0.5 11/26/2022   Lab Results  Component Value Date   HGBA1C 7.0 (A) 01/22/2020      Home Medications/Allergies    Current Outpatient Medications  Medication Sig Dispense Refill   acetaminophen (TYLENOL) 500 MG tablet Take 1,000 mg by mouth every 6 (six) hours as needed for moderate pain.  Alirocumab (PRALUENT) 150 MG/ML SOAJ Inject 150 mg into the skin every 14 (fourteen) days. 2 mL 11   aspirin EC 81 MG tablet Take 1 tablet (81 mg total) by mouth daily.     blood glucose meter kit and supplies KIT by Does not apply route daily as needed. Dispense based on patient and insurance preference. Use up to four times daily as directed. (FOR ICD-9 250.00, 250.01).     BRILINTA 60 MG TABS tablet TAKE 1 TABLET (60 MG TOTAL) BY MOUTH 2 (TWO) TIMES DAILY. (Patient taking differently: Take 60 mg by mouth 2 (two) times daily.) 180 tablet 2   cholecalciferol  (VITAMIN D3) 25 MCG (1000 UNIT) tablet Take 1,000 Units by mouth daily.     clotrimazole-betamethasone (LOTRISONE) cream Apply to feet and between toes bid x 6 weeks 45 g 1   exemestane (AROMASIN) 25 MG tablet Take 25 mg by mouth daily after breakfast.      fluticasone (FLONASE) 50 MCG/ACT nasal spray 2 sprays per nostril at night for stuffy nose at 7pm (Patient taking differently: Place 2 sprays into both nostrils daily. at 7pm) 18.2 mL 5   furosemide (LASIX) 40 MG tablet TAKE 1 TABLET ONCE PER WEEK WITH ADDITIONAL TABLET AS NEEDED FOR SHORTNESS OF BREATH OR WEIGHT GAIN OF 2 POUNDS IN 24 HOURS OR 5 POUNDS IN 1 WEEK 30 tablet 3   glipiZIDE (GLUCOTROL XL) 2.5 MG 24 hr tablet Take 1.25 mg by mouth daily with breakfast.     glucose blood test strip Use as instructed. E11.5. To check blood glucose daily. 100 each 12   magnesium oxide (MAG-OX) 400 MG tablet TAKE 1 TABLET BY MOUTH EVERY DAY (Patient taking differently: Take 400 mg by mouth daily.) 90 tablet 0   metoprolol succinate (TOPROL-XL) 25 MG 24 hr tablet Take 1 tablet (25 mg total) by mouth daily. 90 tablet 3   nitroGLYCERIN (NITROSTAT) 0.4 MG SL tablet Place 1 tablet (0.4 mg total) under the tongue every 5 (five) minutes as needed for chest pain. max of 3 if no relief call 911 25 tablet PRN   rosuvastatin (CRESTOR) 20 MG tablet Take 1 tablet by mouth 3 times per week 30 tablet 6   sacubitril-valsartan (ENTRESTO) 97-103 MG Take 1 tablet by mouth 2 (two) times daily. 60 tablet 5   spironolactone (ALDACTONE) 25 MG tablet Take 1 tablet (25 mg total) by mouth daily. 90 tablet 2   Current Facility-Administered Medications  Medication Dose Route Frequency Provider Last Rate Last Admin   sodium chloride flush (NS) 0.9 % injection 3 mL  3 mL Intravenous Q12H Marie Latch, MD         Allergies  Allergen Reactions   Dilaudid [Hydromorphone Hcl] Anaphylaxis, Itching and Swelling   Epinephrine Anxiety and Other (See Comments)    Severe anxiety    Lactose Intolerance (Gi) Diarrhea   Jardiance [Empagliflozin]     YEAST INFECTION     Assessment & Plan    No problem-specific Assessment & Plan notes found for this encounter.   Marie Tanner PharmD CPP [place]  [address] [place], [place] [address] [phone]

## 2022-12-12 ENCOUNTER — Other Ambulatory Visit (HOSPITAL_BASED_OUTPATIENT_CLINIC_OR_DEPARTMENT_OTHER): Payer: Self-pay | Admitting: Pharmacist Clinician (PhC)/ Clinical Pharmacy Specialist

## 2022-12-12 ENCOUNTER — Encounter (HOSPITAL_BASED_OUTPATIENT_CLINIC_OR_DEPARTMENT_OTHER): Payer: Self-pay | Admitting: Pharmacist Clinician (PhC)/ Clinical Pharmacy Specialist

## 2022-12-12 MED ORDER — NITROGLYCERIN 0.4 MG SL SUBL: 0.4000 mg | SUBLINGUAL_TABLET | SUBLINGUAL | 99 refills | Status: AC | PRN

## 2022-12-12 NOTE — Progress Notes (Signed)
Office Visit    Patient Name: Marie Tanner Date of Encounter: 12/12/2022  Primary Care Provider:  Drue Flirt, MD Primary Cardiologist:  Skeet Latch, MD  Chief Complaint    Weight management  Significant Past Medical History   CAD 2015 STEMI, DES x 2  HTN Still titrating HF meds to control  CHF EF 35-40% by echo 6/23; on Entresto, metoprolol, spironolactone  Hyperlipidemia Will restart Praluent in 2024  OSA No CPAP, was being fitted for airway appliance  DM2 12/23 A1c 6.3, previously as high as 7 (2021), on glipizide 1.25 mg  DVT Provoked, after STEMI hospitalization (had liver laceration in CPR and surgical repair after femoral catheterization)  Breast cancer Finished radiation 2017    Allergies  Allergen Reactions   Dilaudid [Hydromorphone Hcl] Anaphylaxis, Itching and Swelling   Epinephrine Anxiety and Other (See Comments)    Severe anxiety   Lactose Intolerance (Gi) Diarrhea   Jardiance [Empagliflozin]     YEAST INFECTION     History of Present Illness    Marie Tanner is a 67 y.o. female patient of Dr Oval Linsey is in the office today to discuss options for weight loss management.  She as a strong history of cardiac disease including cardiac arrest with CPR that left her with a laceration in the liver and DVT while recovering.  She is doing well now, but has struggled for some time to lose weight.    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   Social History:   Tobacco:no  Alcohol:no  Caffeine: stopped caffeine due to palpitations, does decaf coffee  Adeence Assessment  Do you ever forget to take your medication? '[x]'$ Yes - occasionally '[]'$ No  Do you ever skip doses due to side effects? '[]'$ Yes '[x]'$ No  Do you have trouble affording your medicines? '[x]'$ Yes - Praluent '[]'$ No  Are you ever unable to pick up your medication due to transportation difficulties? '[]'$ Yes '[x]'$ No  Do you ever stop taking your medications because you don't believe they are helping? '[]'$ Yes '[x]'$ No    Accessory Clinical Findings    Lab Results  Component Value Date   CREATININE 0.80 11/26/2022   BUN 13 11/26/2022   NA 139 11/26/2022   K 5.2 11/26/2022   CL 103 11/26/2022   CO2 21 11/26/2022   Lab Results  Component Value Date   ALT 16 11/26/2022   AST 20 11/26/2022   ALKPHOS 83 11/26/2022   BILITOT 0.5 11/26/2022   Lab Results  Component Value Date   HGBA1C 7.0 (A) 01/22/2020      Home Medications/Allergies    Current Outpatient Medications  Medication Sig Dispense Refill   acetaminophen (TYLENOL) 500 MG tablet Take 1,000 mg by mouth every 6 (six)  hours as needed for moderate pain.     Alirocumab (PRALUENT) 150 MG/ML SOAJ Inject 150 mg into the skin every 14 (fourteen) days. 2 mL 11   aspirin EC 81 MG tablet Take 1 tablet (81 mg total) by mouth daily.     blood glucose meter kit and supplies KIT by Does not apply route daily as needed. Dispense based on patient and insurance preference. Use up to four times daily as directed. (FOR ICD-9 250.00, 250.01).     BRILINTA 60 MG TABS tablet TAKE 1 TABLET (60 MG TOTAL) BY MOUTH 2 (TWO) TIMES DAILY. (Patient taking differently: Take 60 mg by mouth 2 (two) times  daily.) 180 tablet 2   cholecalciferol (VITAMIN D3) 25 MCG (1000 UNIT) tablet Take 1,000 Units by mouth daily.     clotrimazole-betamethasone (LOTRISONE) cream Apply to feet and between toes bid x 6 weeks 45 g 1   exemestane (AROMASIN) 25 MG tablet Take 25 mg by mouth daily after breakfast.      fluticasone (FLONASE) 50 MCG/ACT nasal spray 2 sprays per nostril at night for stuffy nose at 7pm (Patient taking differently: Place 2 sprays into both nostrils daily. at 7pm) 18.2 mL 5   furosemide (LASIX) 40 MG tablet TAKE 1 TABLET ONCE PER WEEK WITH ADDITIONAL TABLET AS NEEDED FOR SHORTNESS OF BREATH OR WEIGHT GAIN OF 2 POUNDS IN 24 HOURS OR 5 POUNDS IN 1 WEEK 30 tablet 3   glipiZIDE (GLUCOTROL XL) 2.5 MG 24 hr tablet Take 1.25 mg by mouth daily with breakfast.     glucose blood test strip Use as instructed. E11.5. To check blood glucose daily. 100 each 12   magnesium oxide (MAG-OX) 400 MG tablet TAKE 1 TABLET BY MOUTH EVERY DAY (Patient taking differently: Take 400 mg by mouth daily.) 90 tablet 0   metoprolol succinate (TOPROL-XL) 25 MG 24 hr tablet Take 1 tablet (25 mg total) by mouth daily. 90 tablet 3   nitroGLYCERIN (NITROSTAT) 0.4 MG SL tablet Place 1 tablet (0.4 mg total) under the tongue every 5 (five) minutes as needed for chest pain. max of 3 if no relief call 911 25 tablet PRN   rosuvastatin (CRESTOR) 20 MG tablet Take 1 tablet by mouth 3 times per week 30 tablet 6   sacubitril-valsartan (ENTRESTO) 97-103 MG Take 1 tablet by mouth 2 (two) times daily. 60 tablet 5   spironolactone (ALDACTONE) 25 MG tablet Take 1 tablet (25 mg total) by mouth daily. 90 tablet 2   Current Facility-Administered Medications  Medication Dose Route Frequency Provider Last Rate Last Admin   sodium chloride flush (NS) 0.9 % injection 3 mL  3 mL Intravenous Q12H Skeet Latch, MD         Allergies  Allergen Reactions   Dilaudid [Hydromorphone Hcl] Anaphylaxis, Itching and Swelling   Epinephrine Anxiety and  Other (See Comments)    Severe anxiety   Lactose Intolerance (Gi) Diarrhea   Jardiance [Empagliflozin]     YEAST INFECTION     Assessment & Plan    Class 2 severe obesity due to excess calories with serious comorbidity and body mass index (BMI) of 36.0 to 36.9 in adult [facility])  Patient has not met goal of at least 5% of body weight loss with comprehensive lifestyle modifications alone in the past 3-6 months. Pharmacotherapy is appropriate to pursue as augmentation. Will start Darcel Bayley (or Wegovy depending on insurance coverage).   Confirmed patient not pregnant and no personal or family history of medullary thyroid  carcinoma (MTC) or Multiple Endocrine Neoplasia syndrome type 2 (MEN 2).   Advised patient on common side effects including nausea, diarrhea, dyspepsia, decreased appetite, and fatigue. Counseled patient on reducing meal size and how to titrate medication to minimize side effects. Patient aware to call if intolerable side effects or if experiencing dehydration, abdominal pain, or dizziness. Patient will adhere to dietary modifications and will target at least 150 minutes of moderate intensity exercise weekly.   Injection technique reviewed at today's visit.    Titration Plan:  Will review with patient once we determine which medication will be covered on her new plan  Follow up in 3 months.   Hyperlipidemia LDL goal <70 Assessment: Patient with ASCVD having problems getting Praluent due to copays Most recent LDL 123 - has missed medication for last few months She will be starting with [employer] on Feb 1, their PCSK9 inhibitor of choice is Repatha Plan: Patient agreeable to starting Repatha 140 mg q14d Repeat labs after:  3 months Lipid Liver function   Tommy Medal PharmD CPP [place]  [address] [place], [place] [address] [phone]

## 2022-12-12 NOTE — Assessment & Plan Note (Signed)
Assessment: Patient with ASCVD having problems getting Praluent due to copays Most recent LDL 123 - has missed medication for last few months She will be starting with [employer] on Feb 1, their PCSK9 inhibitor of choice is Repatha Plan: Patient agreeable to starting Repatha 140 mg q14d Repeat labs after:  3 months Lipid Liver function

## 2022-12-12 NOTE — Assessment & Plan Note (Signed)
  Patient has not met goal of at least 5% of body weight loss with comprehensive lifestyle modifications alone in the past 3-6 months. Pharmacotherapy is appropriate to pursue as augmentation. Will start Darcel Bayley (or Wegovy depending on insurance coverage).   Confirmed patient not pregnant and no personal or family history of medullary thyroid carcinoma (MTC) or Multiple Endocrine Neoplasia syndrome type 2 (MEN 2).   Advised patient on common side effects including nausea, diarrhea, dyspepsia, decreased appetite, and fatigue. Counseled patient on reducing meal size and how to titrate medication to minimize side effects. Patient aware to call if intolerable side effects or if experiencing dehydration, abdominal pain, or dizziness. Patient will adhere to dietary modifications and will target at least 150 minutes of moderate intensity exercise weekly.   Injection technique reviewed at today's visit.    Titration Plan:  Will review with patient once we determine which medication will be covered on her new plan  Follow up in 3 months.

## 2022-12-31 ENCOUNTER — Encounter (HOSPITAL_BASED_OUTPATIENT_CLINIC_OR_DEPARTMENT_OTHER): Payer: Self-pay | Admitting: Pharmacist Clinician (PhC)/ Clinical Pharmacy Specialist

## 2022-12-31 ENCOUNTER — Other Ambulatory Visit (HOSPITAL_COMMUNITY): Payer: Self-pay

## 2023-01-01 ENCOUNTER — Other Ambulatory Visit (HOSPITAL_COMMUNITY): Payer: Self-pay

## 2023-01-01 ENCOUNTER — Other Ambulatory Visit: Payer: Self-pay

## 2023-01-01 ENCOUNTER — Telehealth: Payer: Self-pay | Admitting: Cardiovascular Disease

## 2023-01-01 NOTE — Telephone Encounter (Signed)
Marie Tanner from [facility] called stating she received PA for Repatha but she needs lab results with LDL faxed over. She states it it has to be within 6 mon    Fax: [phone]

## 2023-01-02 ENCOUNTER — Other Ambulatory Visit (HOSPITAL_COMMUNITY): Payer: Self-pay

## 2023-01-04 ENCOUNTER — Other Ambulatory Visit (HOSPITAL_COMMUNITY): Payer: Self-pay

## 2023-01-04 ENCOUNTER — Telehealth: Payer: Self-pay

## 2023-01-04 ENCOUNTER — Telehealth (HOSPITAL_COMMUNITY): Payer: Self-pay | Admitting: Emergency Medicine

## 2023-01-04 DIAGNOSIS — I110 Hypertensive heart disease with heart failure: Secondary | ICD-10-CM

## 2023-01-04 NOTE — Telephone Encounter (Signed)
Cardiac pet consent needed

## 2023-01-04 NOTE — Telephone Encounter (Signed)
Pharmacy Patient Advocate Encounter  Prior Authorization for REPATHA 140 MG/ML INJ has been approved.    Effective dates: 01/02/23 through 01/03/24   Received notification from [facility] that prior authorization for REPATHA 140 MG/ML INJ is needed.    PA submitted on 01/01/23 Key [id number] Status is pending  Karie Soda, [place] Patient Advocate Specialist Direct Number: [phone] Fax: [phone]

## 2023-01-05 ENCOUNTER — Encounter (HOSPITAL_COMMUNITY): Payer: Self-pay

## 2023-01-05 ENCOUNTER — Other Ambulatory Visit (HOSPITAL_COMMUNITY): Payer: Self-pay | Admitting: Cardiovascular Disease

## 2023-01-05 ENCOUNTER — Encounter (HOSPITAL_COMMUNITY): Payer: 59

## 2023-01-05 DIAGNOSIS — D869 Sarcoidosis, unspecified: Secondary | ICD-10-CM

## 2023-01-11 ENCOUNTER — Other Ambulatory Visit (HOSPITAL_COMMUNITY): Payer: Self-pay | Admitting: Emergency Medicine

## 2023-01-25 ENCOUNTER — Encounter (HOSPITAL_BASED_OUTPATIENT_CLINIC_OR_DEPARTMENT_OTHER): Payer: Self-pay | Admitting: Cardiovascular Disease

## 2023-01-25 ENCOUNTER — Encounter (HOSPITAL_BASED_OUTPATIENT_CLINIC_OR_DEPARTMENT_OTHER): Payer: Self-pay | Admitting: Pharmacist Clinician (PhC)/ Clinical Pharmacy Specialist

## 2023-01-25 DIAGNOSIS — E785 Hyperlipidemia, unspecified: Secondary | ICD-10-CM

## 2023-01-25 MED ORDER — REPATHA SURECLICK 140 MG/ML ~~LOC~~ SOAJ: 140.0000 mg | SUBCUTANEOUS | 5 refills | Status: DC

## 2023-02-02 ENCOUNTER — Ambulatory Visit: Payer: Commercial Managed Care - HMO | Admitting: Podiatry

## 2023-02-03 ENCOUNTER — Encounter (HOSPITAL_COMMUNITY): Payer: Self-pay

## 2023-02-03 ENCOUNTER — Other Ambulatory Visit (HOSPITAL_COMMUNITY): Payer: Self-pay

## 2023-02-03 ENCOUNTER — Telehealth: Payer: Self-pay | Admitting: Pharmacist Clinician (PhC)/ Clinical Pharmacy Specialist

## 2023-02-03 ENCOUNTER — Telehealth: Payer: Self-pay

## 2023-02-03 DIAGNOSIS — E1165 Type 2 diabetes mellitus with hyperglycemia: Secondary | ICD-10-CM

## 2023-02-03 NOTE — Telephone Encounter (Signed)
Pharmacy Patient Advocate Encounter   Received notification from [facility] that prior authorization for  [place] .75 MG/.5ML  is needed.    PA submitted on 02/03/23 Key [id number] Status is pending  Karie Soda, [place] Patient Advocate Specialist Direct Number: [phone] Fax: [phone]

## 2023-02-03 NOTE — Telephone Encounter (Signed)
Could you please run PA for Trulicity for her.

## 2023-02-03 NOTE — Telephone Encounter (Signed)
List of preferred for patient and what is requiring a PA for reference

## 2023-02-04 ENCOUNTER — Other Ambulatory Visit (HOSPITAL_COMMUNITY): Payer: Self-pay

## 2023-02-04 MED ORDER — TRULICITY 0.75 MG/0.5ML ~~LOC~~ SOAJ: 0.7500 mg | SUBCUTANEOUS | 1 refills | Status: DC

## 2023-02-04 NOTE — Telephone Encounter (Signed)
Rx sent to [facility]

## 2023-02-04 NOTE — Addendum Note (Signed)
Addended by: Rockne Menghini on: 02/04/2023 12:01 PM   Modules accepted: Orders

## 2023-02-04 NOTE — Telephone Encounter (Signed)
Pharmacy Patient Advocate Encounter  Prior Authorization for TRULICITY .75 MG/.5ML has been approved.    Effective dates: 02/03/23 through 02/03/24  Karie Soda, [place] Patient Advocate Specialist Direct Number: [phone] Fax: [phone]

## 2023-02-05 ENCOUNTER — Telehealth (HOSPITAL_COMMUNITY): Payer: Self-pay | Admitting: *Deleted

## 2023-02-05 NOTE — Telephone Encounter (Signed)
Reaching out to patient to offer assistance regarding upcoming cardiac imaging study; pt verbalizes understanding of appt date/time, parking situation and where to check in, pre-test NPO status and verified current allergies; name and call back number provided for further questions should they arise  Gordy Clement RN Navigator Cardiac Imaging [facility] [phone] office [phone] cell  Patient aware to only consume meat, fats, and water. She read the printed instructions.

## 2023-02-09 ENCOUNTER — Encounter (HOSPITAL_COMMUNITY)
Admission: RE | Admit: 2023-02-09 | Discharge: 2023-02-09 | Disposition: A | Discharge status: Home / Self Care | Payer: 59 | Source: Ambulatory Visit | Attending: Cardiovascular Disease | Admitting: Cardiovascular Disease

## 2023-02-09 DIAGNOSIS — D869 Sarcoidosis, unspecified: Secondary | ICD-10-CM | POA: Insufficient documentation

## 2023-02-09 LAB — GLUCOSE, CAPILLARY: Glucose-Capillary: 86 mg/dL (ref 70–99)

## 2023-02-09 MED ORDER — RUBIDIUM RB82 GENERATOR (RUBYFILL)
23.3000 | PACK | Freq: Once | INTRAVENOUS | Status: AC
  Administered 2023-02-09: 23.3 via INTRAVENOUS

## 2023-02-09 MED ORDER — FLUDEOXYGLUCOSE F - 18 (FDG) INJECTION
10.0000 | Freq: Once | INTRAVENOUS | Status: AC | PRN
  Administered 2023-02-09: 9.91 via INTRAVENOUS

## 2023-02-10 ENCOUNTER — Other Ambulatory Visit (HOSPITAL_COMMUNITY): Payer: Self-pay

## 2023-02-10 LAB — NM PET CT MYOCARDIAL SARCOIDOSIS
LV dias vol: 127 mL (ref 46–106)
LV sys vol: 80 mL
Nuc Stress EF: 37 %
Rest Nuclear Isotope Dose: 23.3 mCi

## 2023-03-02 MED ORDER — TRULICITY 1.5 MG/0.5ML ~~LOC~~ SOAJ
1.5000 mg | SUBCUTANEOUS | 0 refills | Status: DC
  Filled 2023-03-22: qty 2, 28d supply, fill #0

## 2023-03-02 NOTE — Addendum Note (Signed)
Addended by: Rollen Sox on: 03/02/2023 11:29 AM   Modules accepted: Orders

## 2023-03-17 ENCOUNTER — Ambulatory Visit (INDEPENDENT_AMBULATORY_CARE_PROVIDER_SITE_OTHER): Payer: 59 | Admitting: Podiatry

## 2023-03-17 ENCOUNTER — Encounter: Payer: Self-pay | Admitting: Podiatry

## 2023-03-17 VITALS — BP 156/79

## 2023-03-17 DIAGNOSIS — M79676 Pain in unspecified toe(s): Secondary | ICD-10-CM

## 2023-03-17 DIAGNOSIS — B351 Tinea unguium: Secondary | ICD-10-CM | POA: Diagnosis not present

## 2023-03-17 DIAGNOSIS — M79609 Pain in unspecified limb: Secondary | ICD-10-CM | POA: Diagnosis not present

## 2023-03-17 DIAGNOSIS — E1142 Type 2 diabetes mellitus with diabetic polyneuropathy: Secondary | ICD-10-CM | POA: Diagnosis not present

## 2023-03-17 DIAGNOSIS — Q828 Other specified congenital malformations of skin: Secondary | ICD-10-CM | POA: Diagnosis not present

## 2023-03-17 DIAGNOSIS — B353 Tinea pedis: Secondary | ICD-10-CM

## 2023-03-17 DIAGNOSIS — Z955 Presence of coronary angioplasty implant and graft: Secondary | ICD-10-CM | POA: Insufficient documentation

## 2023-03-17 MED ORDER — KETOCONAZOLE 2 % EX CREA: TOPICAL_CREAM | CUTANEOUS | 1 refills | Status: DC

## 2023-03-17 NOTE — Patient Instructions (Addendum)
Apply Bag Balm Ointment to rough heels once daily. May be purchased at any drug store.  Athlete's Foot Athlete's foot (tinea pedis) is a fungal infection of the skin on your feet. It often occurs on the skin that is between or underneath the toes. It can also occur on the soles of your feet. The infection can spread from person to person (is contagious). It can also spread when a person's bare feet come in contact with the fungus on shower floors or on items such as shoes. What are the causes? This condition is caused by a fungus that grows in warm, moist places. You can get athlete's foot by sharing shoes, shower stalls, towels, and wet floors with someone who is infected. Not washing your feet or changing your socks often enough can also lead to athlete's foot. What increases the risk? This condition is more likely to develop in: Men. People who have a weak body defense system (immune system). People who have diabetes. People who use public showers, such as at a gym. People who wear heavy-duty shoes, such as Youth worker. Seasons with warm, humid weather. What are the signs or symptoms? Symptoms of this condition include: Itchy areas between your toes or on the soles of your feet. White, flaky, or scaly areas between your toes or on the soles of your feet. Very itchy small blisters between your toes or on the soles of your feet. Small cuts in your skin. These cuts can become infected. Thick or discolored toenails. How is this diagnosed? This condition may be diagnosed with a physical exam and a review of your medical history. Your health care provider may also take a skin or toenail sample to examine under a microscope. How is this treated? This condition is treated with antifungal medicines. These may be applied as powders, ointments, or creams. In severe cases, an oral antifungal medicine may be given. Follow these instructions at home: Medicines Apply or take  over-the-counter and prescription medicines only as told by your health care provider. Apply your antifungal medicine as told by your health care provider. Do not stop using the antifungal even if your condition improves. Foot care Do not scratch your feet. Keep your feet dry: Wear cotton or wool socks. Change your socks every day or if they become wet. Wear shoes that allow air to flow, such as sandals or canvas tennis shoes. Wash and dry your feet, including the area between your toes. Also, wash and dry your feet: Every day or as told by your health care provider. After exercising. General instructions Do not let others use towels, shoes, nail clippers, or other personal items that touch your feet. Protect your feet by wearing sandals in wet areas, such as locker rooms and shared showers. Keep all follow-up visits. This is important. If you have diabetes, keep your blood sugar under control. Contact a health care provider if: You have a fever. You have swelling, soreness, warmth, or redness in your foot. Your feet are not getting better with treatment. Your symptoms get worse. You have new symptoms. You have severe pain. Summary Athlete's foot (tinea pedis) is a fungal infection of the skin on your feet. It often occurs on skin that is between or underneath the toes. This condition is caused by a fungus that grows in warm, moist places. Symptoms include white, flaky, or scaly areas between your toes or on the soles of your feet. This condition is treated with antifungal medicines. Keep your feet  clean. Always dry them thoroughly. This information is not intended to replace advice given to you by your health care provider. Make sure you discuss any questions you have with your health care provider. Document Revised: 03/09/2021 Document Reviewed: 03/09/2021 Elsevier Patient Education  2023 [employer].

## 2023-03-17 NOTE — Progress Notes (Signed)
  Subjective:  Patient ID: Marie Tanner, female    DOB: 08/30/1956,  MRN: [id number]  Marie Tanner presents to clinic today for at risk foot care with history of diabetic neuropathy and painful porokeratotic lesion(s) b/l feet and painful mycotic toenails that limit ambulation. Painful toenails interfere with ambulation. Aggravating factors include wearing enclosed shoe gear. Pain is relieved with periodic professional debridement. Painful porokeratotic lesions are aggravated when weightbearing with and without shoegear. Pain is relieved with periodic professional debridement.  Chief Complaint  Patient presents with   Nail Problem    [facility] BS-did not check today A1C-6.1 PCP-[facility] PCP VST-12/2022   New problem(s): None.   PCP is Quita Skye, PA-C.  Allergies  Allergen Reactions   Dilaudid [Hydromorphone Hcl] Anaphylaxis, Itching and Swelling   Epinephrine Anxiety and Other (See Comments)    Severe anxiety   Lactose Intolerance (Gi) Diarrhea   Jardiance [Empagliflozin]     YEAST INFECTION     Review of Systems: Negative except as noted in the HPI.  Objective: No changes noted in today's physical examination. Vitals:   03/17/23 0855  BP: (!) 156/79   Marie Tanner is a pleasant 67 y.o. female WD, WN in NAD. AAO x 3.  Vascular Examination: Palpable pedal pulses b/l LE. Digital hair present b/l. Skin temperature gradient WNL b/l. No varicosities b/l. CFT <3 seconds b/l LE. Trace edema noted BLE.  Dermatological Examination: Pedal skin with normal turgor, texture and tone b/l. No open wounds. No interdigital macerations b/l. Toenails 1-5 b/l thickened, discolored, dystrophic with subungual debris. There is pain on palpation to dorsal aspect of nailplates.   Porokeratotic lesion(s) L 5th toe, submet head 2 b/l, and submet head 3 b/l. No erythema, no edema, no drainage, no fluctuance.  Neurological Examination: Protective sensation intact with 10 gram monofilament b/l LE.  Vibratory sensation intact b/l LE. Pt has subjective symptoms of neuropathy.  Musculoskeletal Examination: Muscle strength 5/5 to all LE muscle groups b/l. Hammertoe deformity noted 2-5 b/l. Patient ambulates independent of any assistive aids.  Assessment/Plan: 1. Pain due to onychomycosis of nail   2. Porokeratosis   3. Tinea pedis of both feet   4. Diabetic peripheral neuropathy associated with type 2 diabetes mellitus     Meds ordered this encounter  Medications   ketoconazole (NIZORAL) 2 % cream    Sig: Apply to both feet and between toes once daily for 6 weeks.    Dispense:  60 g    Refill:  1  -Patient was evaluated and treated. All patient's and/or POA's questions/concerns answered on today's visit. -Continue foot and shoe inspections daily. Monitor blood glucose per PCP/Endocrinologist's recommendations. -Patient to continue soft, supportive shoe gear daily. -For rough heels, apply Bag Balm Ointment to rough heels once daily. -Mycotic toenails 1-5 bilaterally were debrided in length and girth with sterile nail nippers and dremel without incident. -Porokeratotic lesion(s) left fifth digit, submet head 2 b/l, and submet head 3 b/l pared and enucleated with sterile currette without incident. Total number of lesions debrided=5. -Patient/POA to call should there be question/concern in the interim.   Return in about 3 months (around 06/16/2023).  Freddie Breech, DPM

## 2023-03-18 ENCOUNTER — Encounter (HOSPITAL_BASED_OUTPATIENT_CLINIC_OR_DEPARTMENT_OTHER): Payer: Self-pay | Admitting: Pharmacist Clinician (PhC)/ Clinical Pharmacy Specialist

## 2023-03-18 DIAGNOSIS — E1165 Type 2 diabetes mellitus with hyperglycemia: Secondary | ICD-10-CM

## 2023-03-22 ENCOUNTER — Telehealth (HOSPITAL_COMMUNITY): Payer: Self-pay | Admitting: Vascular Surgery

## 2023-03-22 ENCOUNTER — Other Ambulatory Visit (HOSPITAL_BASED_OUTPATIENT_CLINIC_OR_DEPARTMENT_OTHER): Payer: Self-pay

## 2023-03-22 ENCOUNTER — Ambulatory Visit (INDEPENDENT_AMBULATORY_CARE_PROVIDER_SITE_OTHER): Payer: 59 | Admitting: Cardiovascular Disease

## 2023-03-22 ENCOUNTER — Encounter (HOSPITAL_BASED_OUTPATIENT_CLINIC_OR_DEPARTMENT_OTHER): Payer: Self-pay | Admitting: Cardiovascular Disease

## 2023-03-22 ENCOUNTER — Other Ambulatory Visit (INDEPENDENT_AMBULATORY_CARE_PROVIDER_SITE_OTHER): Payer: 59

## 2023-03-22 VITALS — BP 142/82 | HR 77 | Ht 62.0 in | Wt 200.4 lb

## 2023-03-22 DIAGNOSIS — I4729 Other ventricular tachycardia: Secondary | ICD-10-CM

## 2023-03-22 DIAGNOSIS — E785 Hyperlipidemia, unspecified: Secondary | ICD-10-CM

## 2023-03-22 DIAGNOSIS — D869 Sarcoidosis, unspecified: Secondary | ICD-10-CM | POA: Diagnosis not present

## 2023-03-22 DIAGNOSIS — Z5181 Encounter for therapeutic drug level monitoring: Secondary | ICD-10-CM | POA: Diagnosis not present

## 2023-03-22 DIAGNOSIS — D8685 Sarcoid myocarditis: Secondary | ICD-10-CM

## 2023-03-22 DIAGNOSIS — E119 Type 2 diabetes mellitus without complications: Secondary | ICD-10-CM

## 2023-03-22 DIAGNOSIS — G4733 Obstructive sleep apnea (adult) (pediatric): Secondary | ICD-10-CM

## 2023-03-22 DIAGNOSIS — I5042 Chronic combined systolic (congestive) and diastolic (congestive) heart failure: Secondary | ICD-10-CM

## 2023-03-22 HISTORY — DX: Sarcoid myocarditis: D86.85

## 2023-03-22 MED ORDER — SPIRONOLACTONE 50 MG PO TABS: 50.0000 mg | ORAL_TABLET | Freq: Every day | ORAL | 3 refills | Status: DC

## 2023-03-22 NOTE — Progress Notes (Signed)
Cardiology Office Note  Date:  03/22/2023   ID:  Marie Tanner, DOB 1956/10/26, MRN [id number]  PCP:  Quita Skye, PA-C  Cardiologist:   Chilton Si, MD  Oncologist: Marc Morgans, MD Radiation Oncologist: Thom Chimes, MD Surgical Oncologist: Tyson Alias, MD  No chief complaint on file.  History of Present Illness: Marie Tanner is a 67 y.o. female with CAD s/p STEMI, hypertension, chronic systolic and diastolic heart failure, OSA not on CPAP, diabetes, prior DVT, breast cancer, and mitral regurgitation who presents for follow up.  In 2015 she had a STEMI and cardiac arrest. She was cared for at [facility]. She ultimately received 2 DES (LM/LAD and LCx).  She had a prolonged hospitalization requiring tracheostomy.  Her liver was lacerated during CPR.  She required surgical repair after femoral catheterization.  She also developed a DVT in the hospital.  One week after discharge she developed pneumonia and was admitted at [facility].  She has been seen in the hospital twice for chest pain and ruled out for MI.  She had a negative Myoview 05/2015.  She also had negative CT scans for PE.  Marie Tanner also underwent surgery for breast cancer on 02/21/16.  She completed her radiation in September 2017.  She was referred for sleep study which was abnormal.  However, she declined using the CPAP device and is in the process of being fitted for an oral airway.  Marie Tanner had an echo 06/2016 that showed LVEF 50-55% with grade 1 diastolic dysfunction.    Marie Tanner reported an episode of chest pain that occurred while driving.  She was referred for [facility] 11/24/17 that revealed LVEF 36% with anterior, anteroseptal, apical inferior and apical lateral defects.  There was concern for infarct with peri-infarct ischemia in the inferior and apical walls.  She underwent LHC 11/2017 that revealed patent stents that were heavily calcified and there was a step down after the takeoff of the  diagonal.  She had 40% RI, 40% proximal LCx and 20% prox LAD.  LVEF was 35-45% and LVEDP was elevated.  BiDil was added to her regimen for afterload reduction and poorly controlled hypertension.  She reported increased dyspnea on exertion and edema.  Lasix was increased for 2 days.  She was also referred for an echocardiogram 06/2018 that revealed LVEF 45 to 50% with hypokinesis of the mid to apical inferior and inferoseptal myocardium that was relatively unchanged from prior.  She was also started on spironolactone.  She had atypical chest pain and was referred for an ETT 10/2018 that was negative.  She achieved 6.2 METS on a Bruce protocol.    Marie Tanner reported episodes of dizziness.  Losartan was reduced to 12.5 mg she also had persistent episodes of chest discomfort that were atypical and occurred when lying down.  Thought to be secondary to GERD and her PPI was restarted.  Marie Tanner was struggling due to cost of medications.  She had been out of her BiDil, ticagrelor, and Praluent.  BiDil was switched to hydralazine and Imdur.  Her BP was improving.  It had been as high as 170-180s. She was started on patient assistance for ticagrelor and Praluent.    Marie Tanner BP was above goal, so hydralazine was increased. She called the office 01/19/2021 with chest pain and shortness of breath. She was referred for a nuclear stress test that revealed LVEF 39% with concern for infarct with peri-infarct ischemia. She had a heart monitor 2/22 with 10  beats of NSVT. She also had an echo 03/19/2021 with LVEF 34-40% and moderate to severe mitral regurgitation.  She had an echo 02/2021 that revealed LVEF 35 to 40% and very eccentric mitral regurgitation.  Her left atrium was normal in size.  She was referred for TEE that revealed LVEF 45 to 50% with a very eccentric, anteriorly directed mitral regurgitant jet.  It was thought to be due to a small flail P3 scallop.  There is no evidence of pulmonary flow reversal.   Based on PISA, the ERO was 0.17 cm with a regurgitant volume of 30 mL.  However this was thought to be underestimated in the setting of anesthesia and an eccentric jet.  Overall it was thought to be moderate to severe.  She was started on both [place].  She noted an improvement in her breathing. But Jardiance was stopped due to dysuria. Entresto was increased. She was referred to the structural heart team for evaluation of her valve. She had a R/L Heart Cath 04/21/2021 that showed patent left main and LAD stent. She had non obstructive disease up to 40% in the mid lad and left circumflex. Her filling pressures were normal and her trans-mitral gradient was normal, therefore medical management was recommended.  Marie Tanner saw Dr. Lenell Antu on 03/2022 for her lower extremity edema.  He recommended compression and elevation of her legs for her lower extremity edema.  He did not feel that she needed any intervention for PAD given her lack of symptoms.  He felt that her noncompressible arteries were consistent with diabetes.  At the last visit she noted that her breathing was improving with exercise.  She noted some headaches and Imdur was discontinued.  She had a cardiac MRI 10/2022 that revealed late gadolinium enhancement in the apical anterior and mid to apical inferior walls which was consistent with an LAD infarct.  However the apex was spared which argues against it.  There are also focal areas of subendocardial LGE suggesting infarct in the basal anteroseptum which was not a coronary distribution.  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  Cardiac sarcoid could not be ruled out.  LVEF was 35% and she had mild mitral regurgitation with a regurgitant fraction of 16%.  She had a follow-up cardiac PET scan which showed evidence of prior infarct as well as active FDG uptake consistent with active inflammation/sarcoidosis.  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 She reports pain in her L hand and R leg.  She experiences shortness of breath only with vigorous exertion.  Her breathing has been generally good with no major problems and she denies orthopnea or PND.  She denies chest pain but reports sporadic heart racing, occurring approximately once a month.  They last for several seconds at a time.  There is no associated chest pain, lightheadedness, or dizziness.   She has been experiencing intermittent swelling in her right leg, causing her significant discomfort. She has previously discussed this issue with Dr. Charlynne Pander, who suggested using a heating pad, but it has not provided relief. The pain is located in the front of her leg, from the knee down. Marie Tanner reports that her blood pressure has been running normal at home.  Past Medical History:  Diagnosis Date   Breast cancer    Cancer    Chronic combined systolic and diastolic heart failure 01/30/2015   Depression  DVT (deep venous thrombosis) 09/2014   left femerol artery injury during resusciation.   GERD (gastroesophageal reflux disease)    HCAP (healthcare-associated pneumonia) 11/22/2014   Heart murmur    High cholesterol    History of blood transfusion 09/2014   related to MI   History of gout    Hypertension    Liver laceration 10/19/2014   Palpitations 12/12/2020   Pulmonary edema 10/19/2014   Renal disorder    Renal disorder    Right hip pain 04/03/2021   Sleep apnea    "suppose to wear mask but I don't" (11/22/2014)   STEMI (ST elevation myocardial infarction) 10/19/2014   Type II diabetes mellitus    Urticaria     Past Surgical History:  Procedure Laterality Date   ABDOMINAL HYSTERECTOMY  ~ 2000   APPLICATION OF WOUND VAC  10/2014   "over my naval" (11/22/2014)   BREAST LUMPECTOMY Right 2017   BREAST LUMPECTOMY WITH AXILLARY LYMPH NODE BIOPSY     BREAST SURGERY     CESAREAN SECTION  1980; 1982    CORONARY ANGIOPLASTY WITH STENT PLACEMENT  09/2014   "multiple"/notes 11/22/2014   EXTRACORPOREAL CIRCULATION     Femerol artery repair Right 09/2014   pt insists it was a right femoral artery repair on 11/22/2014   LAPAROSCOPIC ABDOMINAL EXPLORATION     LEFT HEART CATH AND CORONARY ANGIOGRAPHY N/A 12/07/2017   Procedure: LEFT HEART CATH AND CORONARY ANGIOGRAPHY;  Surgeon: Marykay Lex, MD;  Location: [facility] INVASIVE CV LAB;  Service: Cardiovascular;  Laterality: N/A;   LEFT VENTRICULAR ASSIST DEVICE     pt is not aware of this hx on 11/22/2014   Liver laceration repair  09/2014   related to CPR/notes 11/22/2014   RIGHT/LEFT HEART CATH AND CORONARY ANGIOGRAPHY N/A 04/21/2021   Procedure: RIGHT/LEFT HEART CATH AND CORONARY ANGIOGRAPHY;  Surgeon: Yvonne Kendall, MD;  Location: [facility] INVASIVE CV LAB;  Service: Cardiovascular;  Laterality: N/A;   TEE WITHOUT CARDIOVERSION N/A 03/13/2021   Procedure: TRANSESOPHAGEAL ECHOCARDIOGRAM (TEE);  Surgeon: Parke Poisson, MD;  Location: [facility] ENDOSCOPY;  Service: Cardiology;  Laterality: N/A;   TRACHEOSTOMY  09/2014   "closed on it's own when they took it out"   TUBAL LIGATION  1982     Current Outpatient Medications  Medication Sig Dispense Refill   acetaminophen (TYLENOL) 500 MG tablet Take 1,000 mg by mouth every 6 (six) hours as needed for moderate pain.     aspirin EC 81 MG tablet Take 1 tablet (81 mg total) by mouth daily.     blood glucose meter kit and supplies KIT by Does not apply route daily as needed. Dispense based on patient and insurance preference. Use up to four times daily as directed. (FOR ICD-9 250.00, 250.01).     BRILINTA 60 MG TABS tablet TAKE 1 TABLET (60 MG TOTAL) BY MOUTH 2 (TWO) TIMES DAILY. (Patient taking differently: Take 60 mg by mouth 2 (two) times daily.) 180 tablet 2   cholecalciferol (VITAMIN D3) 25 MCG (1000 UNIT) tablet Take 1,000 Units by mouth daily.     clotrimazole-betamethasone (LOTRISONE) cream Apply to feet  and between toes bid x 6 weeks 45 g 1   Dulaglutide (TRULICITY) 1.5 MG/0.5ML SOPN Inject 1.5 mg into the skin once a week. 2 mL 0   Evolocumab (REPATHA SURECLICK) 140 MG/ML SOAJ Inject 140 mg into the skin every 14 (fourteen) days. 2 mL 5   exemestane (AROMASIN) 25 MG tablet Take 25 mg by mouth  daily after breakfast.      fluticasone (FLONASE) 50 MCG/ACT nasal spray 2 sprays per nostril at night for stuffy nose at 7pm (Patient taking differently: Place 2 sprays into both nostrils daily. at 7pm) 18.2 mL 5   furosemide (LASIX) 40 MG tablet TAKE 1 TABLET ONCE PER WEEK WITH ADDITIONAL TABLET AS NEEDED FOR SHORTNESS OF BREATH OR WEIGHT GAIN OF 2 POUNDS IN 24 HOURS OR 5 POUNDS IN 1 WEEK 30 tablet 3   glipiZIDE (GLUCOTROL XL) 2.5 MG 24 hr tablet Take 1.25 mg by mouth daily with breakfast.     glucose blood test strip Use as instructed. E11.5. To check blood glucose daily. 100 each 12   ketoconazole (NIZORAL) 2 % cream Apply to both feet and between toes once daily for 6 weeks. 60 g 1   magnesium oxide (MAG-OX) 400 MG tablet TAKE 1 TABLET BY MOUTH EVERY DAY (Patient taking differently: Take 400 mg by mouth daily.) 90 tablet 0   metoprolol succinate (TOPROL-XL) 25 MG 24 hr tablet Take 1 tablet (25 mg total) by mouth daily. 90 tablet 3   nitroGLYCERIN (NITROSTAT) 0.4 MG SL tablet Place 1 tablet (0.4 mg total) under the tongue every 5 (five) minutes as needed for chest pain. max of 3 if no relief call 911 25 tablet PRN   rosuvastatin (CRESTOR) 20 MG tablet Take 1 tablet by mouth 3 times per week 30 tablet 6   sacubitril-valsartan (ENTRESTO) 97-103 MG Take 1 tablet by mouth 2 (two) times daily. 60 tablet 5   spironolactone (ALDACTONE) 25 MG tablet Take 1 tablet (25 mg total) by mouth daily. 90 tablet 2   Current Facility-Administered Medications  Medication Dose Route Frequency Provider Last Rate Last Admin   sodium chloride flush (NS) 0.9 % injection 3 mL  3 mL Intravenous Q12H Chilton Si, MD         Allergies:   Dilaudid [hydromorphone hcl], Epinephrine, Lactose intolerance (gi), and Jardiance [empagliflozin]    Social History:  The patient  reports that she has never smoked. She has never used smokeless tobacco. She reports current alcohol use. She reports that she does not use drugs.   Family History:  The patient's family history includes Alzheimer's disease in her father; Asthma in her daughter; COPD in her mother; Diabetes in her brother and mother; Heart attack in her father; Heart disease in her father; Hypertension in her mother.    ROS:   Please see the history of present illness. (+) Chest discomfort (+) Fatigue (+) LE edema All other systems are reviewed and negative.    PHYSICAL EXAM: VS:  There were no vitals taken for this visit. , BMI There is no height or weight on file to calculate BMI. GENERAL:  Well appearing HEENT: Pupils equal round and reactive, fundi not visualized, oral mucosa unremarkable NECK:  No jugular venous distention, waveform within normal limits, carotid upstroke brisk and symmetric, no bruits LUNGS:  Clear to auscultation bilaterally HEART:  RRR.  PMI not displaced or sustained, S1 and S2 within normal limits, no S3, no S4, no clicks, no rubs, III/VI systolic murmur anteriorly ABD:  Flat, positive bowel sounds normal in frequency in pitch, no bruits, no rebound, no guarding, no midline pulsatile mass, no hepatomegaly, no splenomegaly EXT:  2 plus pulses throughout, no edema, no cyanosis no clubbing SKIN:  No rashes no nodules NEURO:  Cranial nerves II through XII grossly intact, motor grossly intact throughout PSYCH:  Cognitively intact, oriented to person place  and time   EKG:  11/06/2021: EKG was not ordered. 03/06/2021: EKG was not ordered. 03/12/2020: Sinus rhythm.  Rate 81 bpm.  Prior inferior infarct. 02/08/2019: Sinus rhythm.  Rate 80 bpm.  Prior inferior infarct. 07/22/18: Sinu srhythm.  Rate 90 bpm.  Prior inferior infarct.    11/16/17: Sinus rhythm.  Rate 87 bpm.  Prior inferior infarct.  10/05/16: Sinus rhythm. Rate 85 bpm. Prior inferior infarct. 02/11/16: Sinus arrhythmia.  Rate 71 bpm. Non-specific ST-T changes.  PET CT 01/2023: Narrative & Impression      FDG uptake was observed. FDG uptake was diffuse. FDG uptake was present in the apical to mid anterior, septal and apex segment(s). LV perfusion is abnormal. Defect 1: There is a large defect with severe reduction in uptake present in the apical anterior, inferior and apex location(s). There is abnormal wall motion in the defect area. Consistent with infarction. Defect 2: There is a small defect with mild reduction in uptake present in the mid septal location(s). There is abnormal wall motion in the defect area. Consistent with infarction.   Left ventricular function is abnormal. There were multiple regional abnormalities. EF: 37 %. Akinesis of the apical septum, apical inferior segment, and apex. Akinesis of the basal to mid septum. End diastolic cavity size is mildly enlarged. End systolic cavity size is mildly enlarged.   Coronary calcium assessment not performed due to prior revascularization.   FDG uptake findings are consistent with active inflammation/sarcoidosis.   CT-A Chest 08/31/2021:   IMPRESSION: 1. Technically adequate exam showing no acute pulmonary embolus. 2. Post radiation changes in the RIGHT UPPER lobe of the lung. Postsurgical changes in the RIGHT axilla. 3. Possible layering sludge or stones within the gallbladder, only partially imaged.  Right/Left Heart Cath 04/21/2021: Conclusions: Nonobstructive coronary artery disease with up to 40% stenosis involving the mid LAD and mid LCx.  Overall appearance is similar to prior catheterization in 2019. Patent LMCA/LAD stent. Normal left and right heart filling pressures. Mild pulmonary hypertension. Normal to supranormal Fick cardiac output/index. Normal transmitral gradient.    Recommendations: Continue evidence-based heart failure therapy and ongoing management of mitral regurgitation per Dr. Duke Salvia and valve team. Secondary prevention of coronary artery disease.  Diagnostic Dominance: Left   TEE 03/13/21: 1. Very eccentric, anteriorly directed jet of mitral valve regurgitation  with small flail segment at P3 scallop. No systolic reversals in pulmonary  veins. Mitral valve regurgitation appears moderate-severe. PISA  quantitation with angle correction  results in ERO 0.17 cm2 with regurgitant volume 30 mL. Patient's systolic  blood pressure was approximately 90 mmHg at time of MV regurgitation  evaluation, suspect degree of regurgitation may appear worse at ambulatory  blood pressures given flail segment.   The mitral valve is abnormal. Moderate to severe mitral valve  regurgitation. No evidence of mitral stenosis.   2. Left ventricular ejection fraction, by estimation, is 45 to 50%. The  left ventricle has mildly decreased function.   3. Right ventricular systolic function is normal. The right ventricular  size is normal.   4. Left atrial size was moderately dilated. No left atrial/left atrial  appendage thrombus was detected. The LAA emptying velocity was 68 cm/s.   5. Right atrial size was moderately dilated.   6. A small pericardial effusion is present.   7. The aortic valve is normal in structure. Aortic valve regurgitation is  not visualized. No aortic stenosis is present.   8. There is Moderate (Grade III) atheroma plaque involving the transverse  and descending aorta.   9. Agitated saline contrast bubble study was positive with shunting  observed after >6 cardiac cycles suggestive of intrapulmonary shunting.  ECHO 03/04/2021:   1. Consider TEE to further evaluate mitral regugitation, very eccentric  jet which may be more severe than depicted on color Doppler.   2. Left ventricular ejection fraction, by estimation, is 35 to 40%. The  left  ventricle has moderately decreased function. The left ventricle  demonstrates global hypokinesis. There is mild left ventricular  hypertrophy. Left ventricular diastolic  parameters are consistent with Grade I diastolic dysfunction (impaired  relaxation).   3. Right ventricular systolic function is normal. The right ventricular  size is normal.   4. The mitral valve is normal in structure. Moderate to severe mitral  valve regurgitation. No evidence of mitral stenosis.   5. The aortic valve is tricuspid. There is mild calcification of the  aortic valve. There is mild thickening of the aortic valve. Aortic valve  regurgitation is not visualized. No aortic stenosis is present.   6. The inferior vena cava is normal in size with greater than 50%  respiratory variability, suggesting right atrial pressure of 3 mmHg.  Non-telemetry monitor study 01/22/2021: 30 Day Event Monitor   Quality: Fair.  Baseline artifact. Predominant rhythm: sinus rhythm                         Average heart rate: 81 bpm Max heart rate: 157 bpm Min heart rate: 47 bpm   10 beats NSVT Patient did submit a symptom diary.  She reported shortness of breath and heart fluttering at which time sinus rhythm was noted.    01/17/2021 Stress Test : Study Highlights    The left ventricular ejection fraction is moderately decreased (30-44%). Nuclear stress EF: 39%. Blood pressure demonstrated a hypertensive response to exercise. Downsloping ST segment depression of 0.5 mm was noted in the II and aVF leads (ST deviation beginning in recovery). Defect 1: There is a medium defect of moderate severity present in the apical anterior and apex location. Defect 2: There is a medium defect of severe severity present in the mid inferior and apical inferior location. Findings consistent with prior myocardial infarction with peri-infarct ischemia. This is an intermediate risk study.   Intermediate risk stress nuclear study with  evidence of previous infarction in the distal LAD artery and posterior descending artery distribution, with very limited peri-infarct ischemia.  Moderately reduced left ventricular global systolic function. No significant change since 2018.  07/26/18: Study Conclusions   - Left ventricle: The cavity size was normal. Systolic function was   mildly reduced. The estimated ejection fraction was in the range   of 45% to 50%. Hypokinesis of the mid-apicalinferior and   inferoseptal myocardium. Doppler parameters are consistent with   abnormal left ventricular relaxation (grade 1 diastolic   dysfunction). Acoustic contrast opacification revealed no   evidence ofthrombus. - Mitral valve: There was moderate regurgitation directed   eccentrically and anteriorly. - Left atrium: The atrium was mildly dilated. - Atrial septum: No defect or patent foramen ovale was identified.   Impressions:   - Direct comparison to images from August 2917 shows no change in   wall motion pattern or overall LV EF.  LHC 12/07/17: Mid LM to Ost LAD DES Stent is 10% stenosed. -This entire segment is heavily calcified. There is a significant step down after the stent at the takeoff of the diagonal branches. Prox LAD  lesion is 20% stenosed. Ost Ramus lesion is 40% stenosed. Proc Circumflex ~40%. There is moderate left ventricular systolic dysfunction. The left ventricular ejection fraction is 35-45% by visual estimate. LV end diastolic pressure is moderately elevated. 22-24 mmHg  ETT 10/2018: No ischemia.  6.2 METS on Bruce protocol.[name]  Lexiscan Myoview 11/24/17: The left ventricular ejection fraction is moderately decreased (30-44%). Nuclear stress EF: 36%. There was no ST segment deviation noted during stress. There is a large defect of severe severity present in the mid anterior, mid inferior, apical anterior, apical septal, apical inferior, apical lateral and apex location. The defect is partially reversible. This  is consistent with infarct with small amount of peri infarct ischemia in the inferior and apical walls. This is a high risk study. Compared to prior study the EF has declined and there appears to be peri infarct ischemia in the inferior wall although this could also be variations in diaphragmatic attenuation artifact.  Recent Labs: 07/13/2022: BNP CANCELED 11/26/2022: ALT 16; BUN 13; Creatinine, Ser 0.80; Potassium 5.2; Sodium 139    Lipid Panel    Component Value Date/Time   CHOL 197 11/26/2022 1029   TRIG 90 11/26/2022 1029   HDL 58 11/26/2022 1029   CHOLHDL 3.4 11/26/2022 1029   CHOLHDL 3.1 10/05/2016 0937   VLDL 15 10/05/2016 0937   LDLCALC 123 (H) 11/26/2022 1029    Wt Readings from Last 3 Encounters:  12/12/22 197 lb 10.3 oz (89.7 kg)  11/12/22 198 lb 1.6 oz (89.9 kg)  07/13/22 196 lb (88.9 kg)     ASSESSMENT AND PLAN:  # Cardiac sarcoidosis: # NSVT: Noted on cardiac PET.  We will [facility] to [facility] for treatment and management.  We will also get a 7 day Zio to assess her arrhythmia burden.  She does have a history of NSVT noted on the monitor in 2022.  She also had cardiac arrest, though this was in the setting of STEMI.  She has been feeling fairly well.  We will hold off on any treatment until she is seen by the heart failure team.  Continue metoprolol  # CAD s/p STEMI: # Chronic angina: # Hyperlipidemia:  # NSVT: Currently denies angina.  Most recent nuclear stress 12/2020 with LVEF 39% with prior infarct in the distal LAD and PDA with very limited peri-infarct ischemia.  Marie Tanner had an ST elevation myocardial infarction with stents placed in the LAD/left main and left circumflex arteries.  She is on low dose ticagrelor indefinitely.  LHC 11/2017 showed patent stents with 20-40% lesions.  She has not had any angina.  Imdur was discontinued and she has been stable.  She was previously off her Praluent but it has been restarted.  Repeat lipids and a  CMP.  Continue rosuvastatin and Praluent.     # Chronic systolic and diastolic heart failure: # Hypertensive heart disease:  LVEF 35 to 40%.    She did not tolerate Jardiance due to vaginal irritation.  Blood pressure has been elevated both here and at home. Continue Entresto to 97/103 mg.  Increase spironolactone to 50mg .  Check a CMP in 1 week.  Continue metoprolol.  We may need to add hydralazine/nitrates. Volume status is stable.  Continue Lasix.   # OSA: Oral airway device.    # Shortness of breath:  # Mild MR:  # Extracardiac shunt:  On TEE there was a late positive bubble study.  This is concerning for an intrapulmonary shunt.  Her  last chest imaging was in 2020 and there was no evidence of this.  She has no polycythemia and no right heart dysfunction or pulm elevated pulmonary pressures.  Chest CTA 08/2021 did not reveal any vascular abnormalities.  She has been evaluated by the structural heart team and medical management was recommended for now for her moderate to severe MR. Cardiac MRI revealed that her mitral regurgitation was actually only mild.   Current medicines are reviewed at length with the patient today.  The patient does not have concerns regarding medicines.  The following changes have been made: Increase spironolactone  Labs/ tests ordered today include:   No orders of the defined types were placed in this encounter.    Disposition:   FU with Shaye Elling C. Duke Salvia, MD, [facility] in 3 months.  Refer to HF clinic.    Signed, Audel Coakley C. Duke Salvia, MD, [facility]  03/22/2023 8:28 AM    [facility]

## 2023-03-22 NOTE — Telephone Encounter (Signed)
Lvm to make new chf appt w/ AD this week (URGENT)

## 2023-03-22 NOTE — Patient Instructions (Addendum)
Medication Instructions:  INCREASE SPIRONOLACTONE TO 50 MG DAILY   *If you need a refill on your cardiac medications before your next appointment, please call your pharmacy*  Lab Work: BMET IN 1 WEEK   If you have labs (blood work) drawn today and your tests are completely normal, you will receive your results only by: MyChart Message (if you have MyChart) OR A paper copy in the mail If you have any lab test that is abnormal or we need to change your treatment, we will call you to review the results.  Testing/Procedures: 7 DAY ZIO   Follow-Up: At [facility], you and your health needs are our priority.  As part of our continuing mission to provide you with exceptional heart care, we have created designated Provider Care Teams.  These Care Teams include your primary Cardiologist (physician) and Advanced Practice Providers (APPs -  Physician Assistants and Nurse Practitioners) who all work together to provide you with the care you need, when you need it.  We recommend signing up for the patient portal called "MyChart".  Sign up information is provided on this After Visit Summary.  MyChart is used to connect with patients for Virtual Visits (Telemedicine).  Patients are able to view lab/test results, encounter notes, upcoming appointments, etc.  Non-urgent messages can be sent to your provider as well.   To learn more about what you can do with MyChart, go to [email or web address].    Your next appointment:   3 month(s)  Provider:   Chilton Si, MD or Gillian Shields, NP    You have been referred to HEART FAILURE CLINIC  IF YOU DO NOT HEAR FROM THE OFFICE IN 2 WEEKS PLEASE CALL TO FOLLOW UP  Other Instructions  ZIO XT- Long Term Monitor Instructions  Your physician has requested you wear a ZIO patch monitor for 7 days.  This is a single patch monitor. [employer] supplies one patch monitor per enrollment. Additional stickers are not available. Please do not apply patch if  you will be having a Nuclear Stress Test,  Echocardiogram, Cardiac CT, MRI, or Chest Xray during the period you would be wearing the  monitor. The patch cannot be worn during these tests. You cannot remove and re-apply the  ZIO XT patch monitor.  Your ZIO patch monitor will be mailed 3 day USPS to your address on file. It may take 3-5 days  to receive your monitor after you have been enrolled.  Once you have received your monitor, please review the enclosed instructions. Your monitor  has already been registered assigning a specific monitor serial # to you.  Billing and Patient Assistance Program Information  We have supplied [employer] with any of your insurance information on file for billing purposes. [employer] offers a sliding scale Patient Assistance Program for patients that do not have  insurance, or whose insurance does not completely cover the cost of the ZIO monitor.  You must apply for the Patient Assistance Program to qualify for this discounted rate.  To apply, please call [employer] at [phone], select option 4, select option 2, ask to apply for  Patient Assistance Program. Meredeth Ide will ask your household income, and how many people  are in your household. They will quote your out-of-pocket cost based on that information.  [employer] will also be able to set up a 68-month, interest-free payment plan if needed.  Applying the monitor   Shave hair from upper left chest.  Hold abrader disc by orange tab. Rub abrader in  40 strokes over the upper left chest as  indicated in your monitor instructions.  Clean area with 4 enclosed alcohol pads. Let dry.  Apply patch as indicated in monitor instructions. Patch will be placed under collarbone on left  side of chest with arrow pointing upward.  Rub patch adhesive wings for 2 minutes. Remove white label marked "1". Remove the white  label marked "2". Rub patch adhesive wings for 2 additional minutes.  While looking in a mirror, press and  release button in center of patch. A small green light will  flash 3-4 times. This will be your only indicator that the monitor has been turned on.  Do not shower for the first 24 hours. You may shower after the first 24 hours.  Press the button if you feel a symptom. You will hear a small click. Record Date, Time and  Symptom in the Patient Logbook.  When you are ready to remove the patch, follow instructions on the last 2 pages of Patient  Logbook. Stick patch monitor onto the last page of Patient Logbook.  Place Patient Logbook in the blue and white box. Use locking tab on box and tape box closed  securely. The blue and white box has prepaid postage on it. Please place it in the mailbox as  soon as possible. Your physician should have your test results approximately 7 days after the  monitor has been mailed back to [facility].  Call [facility] Customer Care at [phone] if you have questions regarding  your ZIO XT patch monitor. Call them immediately if you see an orange light blinking on your  monitor.  If your monitor falls off in less than 4 days, contact our Monitor department at [phone].  If your monitor becomes loose or falls off after 4 days call [employer] at [phone] for  suggestions on securing your monitor

## 2023-03-26 ENCOUNTER — Ambulatory Visit (HOSPITAL_COMMUNITY)
Admission: RE | Admit: 2023-03-26 | Discharge: 2023-03-26 | Disposition: A | Discharge status: Home / Self Care | Payer: 59 | Source: Ambulatory Visit | Attending: Cardiology | Admitting: Cardiology

## 2023-03-26 VITALS — BP 130/78 | HR 69 | Wt 197.0 lb

## 2023-03-26 DIAGNOSIS — G4733 Obstructive sleep apnea (adult) (pediatric): Secondary | ICD-10-CM | POA: Insufficient documentation

## 2023-03-26 DIAGNOSIS — I2510 Atherosclerotic heart disease of native coronary artery without angina pectoris: Secondary | ICD-10-CM

## 2023-03-26 DIAGNOSIS — Z9861 Coronary angioplasty status: Secondary | ICD-10-CM

## 2023-03-26 DIAGNOSIS — D8685 Sarcoid myocarditis: Secondary | ICD-10-CM

## 2023-03-26 DIAGNOSIS — I5020 Unspecified systolic (congestive) heart failure: Secondary | ICD-10-CM | POA: Insufficient documentation

## 2023-03-26 DIAGNOSIS — Z955 Presence of coronary angioplasty implant and graft: Secondary | ICD-10-CM | POA: Insufficient documentation

## 2023-03-26 DIAGNOSIS — I255 Ischemic cardiomyopathy: Secondary | ICD-10-CM | POA: Insufficient documentation

## 2023-03-26 DIAGNOSIS — I110 Hypertensive heart disease with heart failure: Secondary | ICD-10-CM | POA: Insufficient documentation

## 2023-03-26 DIAGNOSIS — Z79899 Other long term (current) drug therapy: Secondary | ICD-10-CM | POA: Diagnosis not present

## 2023-03-26 DIAGNOSIS — I252 Old myocardial infarction: Secondary | ICD-10-CM | POA: Insufficient documentation

## 2023-03-26 LAB — COMPREHENSIVE METABOLIC PANEL
ALT: 19 U/L (ref 0–44)
AST: 25 U/L (ref 15–41)
Albumin: 4 g/dL (ref 3.5–5.0)
Alkaline Phosphatase: 66 U/L (ref 38–126)
Anion gap: 10 (ref 5–15)
BUN: 14 mg/dL (ref 8–23)
CO2: 24 mmol/L (ref 22–32)
Calcium: 9.7 mg/dL (ref 8.9–10.3)
Chloride: 105 mmol/L (ref 98–111)
Creatinine, Ser: 0.87 mg/dL (ref 0.44–1.00)
GFR, Estimated: >60 mL/min (ref >60)
Glucose, Bld: 93 mg/dL (ref 70–99)
Potassium: 4.9 mmol/L (ref 3.5–5.1)
Sodium: 139 mmol/L (ref 135–145)
Total Bilirubin: 1.1 mg/dL (ref 0.3–1.2)
Total Protein: 8.9 g/dL — ABNORMAL HIGH (ref 6.5–8.1)

## 2023-03-26 LAB — CBC
HCT: 41.7 % (ref 36.0–46.0)
Hemoglobin: 13.3 g/dL (ref 12.0–15.0)
MCH: 26.9 pg (ref 26.0–34.0)
MCHC: 31.9 g/dL (ref 30.0–36.0)
MCV: 84.2 fL (ref 80.0–100.0)
Platelets: 267 10*3/uL (ref 150–400)
RBC: 4.95 MIL/uL (ref 3.87–5.11)
RDW: 13.9 % (ref 11.5–15.5)
WBC: 3 10*3/uL — ABNORMAL LOW (ref 4.0–10.5)
nRBC: 0 % (ref 0.0–0.2)

## 2023-03-26 NOTE — Progress Notes (Signed)
ADVANCED HEART FAILURE CLINIC NOTE  Referring Physician: Quita Skye, PA-C  Primary Care: Quita Skye, PA-C Primary Cardiologist:  HPI: Marie Tanner is a 67 y.o. female with coronary artery disease status post STEMI, hypertension, chronic systolic heart failure, obstructive sleep apnea not on CPAP, type 2 diabetes, history of DVT, breast cancer that presents today to establish care.  It appears that Marie Tanner's cardiac history dates back to 2015 when she had a STEMI complicated by cardiac arrest at [facility].  She received 2 stents to the left main/LAD and circumflex.  During this time she had a prolonged hospitalization requiring tracheostomy; liver laceration during CPR which required surgical repair and also developed a DVT during this time.  She underwent repeat left heart cath in January 2019 that demonstrated patent stents which were heavily calcified with other areas of nonobstructive CAD.  EF at that time was 35 to 45%.  With increasing GDMT she had improvement in EF by late 2019 to 40 to 50%.  Due to moderate to severe eccentric mitral regurgitation she had a TEE in 2022 and repeat right and left heart cath with structural evaluation in May 2022 that showed patent left main and LAD stents normal filling pressures and a normal transmitral gradient.  She was deferred for medical management only.  At some point she had a cardiac MRI with noncoronary distribution of LGE followed by a cardiac PET that showed FDG uptake consistent with active inflammation/sarcoid.  Interval hx:  From a functional standpoint, Marie Tanner reports feeling overall "okay". She mostly feels limited by fatigue and very infrequently has shortness of breath. She works part time at [employer], has her own food truck Glass blower/designer and [place]) and also works at [employer]. She reports feeling palpitations once monthly at most.   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   GERD (gastroesophageal reflux disease)    HCAP (healthcare-associated pneumonia) 11/22/2014   Heart murmur    High cholesterol    History of blood transfusion 09/2014   related to MI   History of gout    Hypertension    Liver laceration 10/19/2014   Palpitations 12/12/2020   Pulmonary edema 10/19/2014   Renal disorder    Renal disorder    Right hip pain 04/03/2021   Sleep apnea    "suppose to wear mask but I don't" (11/22/2014)   STEMI (ST elevation myocardial infarction) (HCC) 10/19/2014   Type II diabetes mellitus (HCC)    Urticaria     Current Outpatient Medications  Medication Sig Dispense Refill   acetaminophen (TYLENOL) 500 MG tablet Take 1,000 mg by mouth every 6 (six) hours as needed for moderate pain.     aspirin EC 81 MG tablet Take 1 tablet (81 mg total) by mouth daily.     blood glucose meter kit and supplies KIT by Does not apply route daily as needed. Dispense based on patient and insurance preference. Use up to four times daily as directed. (FOR ICD-9 250.00,  250.01).     BRILINTA 60 MG TABS tablet TAKE 1 TABLET (60 MG TOTAL) BY MOUTH 2 (TWO) TIMES DAILY. (Patient taking differently: Take 60 mg by mouth 2 (two) times daily.) 180 tablet 2   cholecalciferol (VITAMIN D3) 25 MCG (1000 UNIT) tablet Take 1,000 Units by mouth daily.     Dulaglutide (TRULICITY) 1.5 MG/0.5ML SOPN Inject 1.5 mg into the skin once a week. 2 mL 0   Evolocumab (REPATHA SURECLICK) 140 MG/ML SOAJ Inject 140 mg into the  skin every 14 (fourteen) days. 2 mL 5   fluticasone (FLONASE) 50 MCG/ACT nasal spray 2 sprays per nostril at night for stuffy nose at 7pm (Patient taking differently: Place 2 sprays into both nostrils daily. at 7pm) 18.2 mL 5   furosemide (LASIX) 40 MG tablet TAKE 1 TABLET ONCE PER WEEK WITH ADDITIONAL TABLET AS NEEDED FOR SHORTNESS OF BREATH OR WEIGHT GAIN OF 2 POUNDS IN 24 HOURS OR 5 POUNDS IN 1 WEEK 30 tablet 3   glipiZIDE (GLUCOTROL XL) 2.5 MG 24 hr tablet Take 1.25 mg by mouth daily with breakfast.     glucose blood test strip Use as instructed. E11.5. To check blood glucose daily. 100 each 12   ketoconazole (NIZORAL) 2 % cream Apply to both feet and between toes once daily for 6 weeks. 60 g 1   magnesium oxide (MAG-OX) 400 MG tablet TAKE 1 TABLET BY MOUTH EVERY DAY (Patient taking differently: Take 400 mg by mouth daily.) 90 tablet 0   metoprolol succinate (TOPROL-XL) 25 MG 24 hr tablet Take 1 tablet (25 mg total) by mouth daily. 90 tablet 3   nitroGLYCERIN (NITROSTAT) 0.4 MG SL tablet Place 1 tablet (0.4 mg total) under the tongue every 5 (five) minutes as needed for chest pain. max of 3 if no relief call 911 25 tablet PRN   rosuvastatin (CRESTOR) 20 MG tablet Take 1 tablet by mouth 3 times per week 30 tablet 6   sacubitril-valsartan (ENTRESTO) 97-103 MG Take 1 tablet by mouth 2 (two) times daily. 60 tablet 5   spironolactone (ALDACTONE) 50 MG tablet Take 1 tablet (50 mg total) by mouth daily. 90 tablet 3   exemestane (AROMASIN) 25 MG tablet Take 25 mg by mouth daily after breakfast.      Current Facility-Administered Medications  Medication Dose Route Frequency Provider Last Rate Last Admin   sodium chloride flush (NS) 0.9 % injection 3 mL  3 mL Intravenous Q12H Chilton Si, MD        Allergies  Allergen Reactions   Dilaudid [Hydromorphone Hcl] Anaphylaxis, Itching and Swelling   Epinephrine Anxiety and Other (See Comments)    Severe anxiety   Lactose Intolerance (Gi) Diarrhea    Jardiance [Empagliflozin]     YEAST INFECTION       Social History   Socioeconomic History   Marital status: Divorced    Spouse name: Not on file   Number of children: 2   Years of education: Not on file   Highest education level: Not on file  Occupational History   Not on file  Tobacco Use   Smoking status: Never   Smokeless tobacco: Never  Vaping Use   Vaping Use: Never used  Substance and Sexual Activity   Alcohol use: Yes    Comment: occ   Drug use: No   Sexual activity: Not Currently  Other Topics Concern   Not on file  Social History Narrative   Not on file   Social Determinants of  Health   Financial Resource Strain: Not on file  Food Insecurity: Not on file  Transportation Needs: Not on file  Physical Activity: Not on file  Stress: Not on file  Social Connections: Not on file  Intimate Partner Violence: Not on file      Family History  Problem Relation Age of Onset   Diabetes Mother    Hypertension Mother    COPD Mother    Heart disease Father    Heart attack Father    Alzheimer's disease Father    Diabetes Brother    Asthma Daughter    Stroke Neg Hx    Colon cancer Neg Hx    Stomach cancer Neg Hx    Pancreatic cancer Neg Hx    AAA (abdominal aortic aneurysm) Neg Hx     PHYSICAL EXAM: Vitals:   03/26/23 0922  BP: 130/78  Pulse: 69  SpO2: 99%   GENERAL: Well nourished, well developed, and in no apparent distress at rest.  HEENT: Negative for arcus senilis or xanthelasma. There is no scleral icterus.  The mucous membranes are pink and moist.   NECK: Supple, No masses. Normal carotid upstrokes without bruits. No masses or thyromegaly.    CHEST: There are no chest wall deformities. There is no chest wall tenderness. Respirations are unlabored.  Lungs- CTA B/L CARDIAC:  JVP: 7 cm H2O         Normal S1, S2  Normal rate with regular rhythm. No murmurs, rubs or gallops.  Pulses are 2+ and symmetrical in upper and lower extremities. no edema.   ABDOMEN: Soft, non-tender, non-distended. There are no masses or hepatomegaly. There are normal bowel sounds.  EXTREMITIES: Warm and well perfused with no cyanosis, clubbing.  LYMPHATIC: No axillary or supraclavicular lymphadenopathy.  NEUROLOGIC: Patient is oriented x3 with no focal or lateralizing neurologic deficits.  PSYCH: Patients affect is appropriate, there is no evidence of anxiety or depression.  SKIN: Warm and dry; no lesions or wounds.   DATA REVIEW  ECG: 03/26/23: NSR  As per my personal interpretation  ECHO: 05/07/22: LVEF 35%-40%, eccentric moderate - severe MR, normal RV function As per my personal interpretation  CATH: 04/21/21:   Nonobstructive coronary artery disease with up to 40% stenosis involving the mid LAD and mid LCx.  Overall appearance is similar to prior catheterization in 2019. Patent LMCA/LAD stent. Normal left and right heart filling pressures. Mild pulmonary hypertension. Normal to supranormal Fick cardiac output/index. Normal transmitral gradient.  CMR:  11/03/22:  1. There is subendocardial LGE in apical anterior and mid to apical inferior walls, which could be consistent with LAD territory infarct as patient has known large wrap-around LAD, but the apex is spared, which argues against this. In addition, there is focal area of subendocardial LGE suggesting infarct in the basal anteroseptum, which is not in a coronary distribution. There is also mid inferoseptal midwall LGE, which is a nonischemic scar pattern. 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 Cardiac sarcoid is on the differential, as can cause both subendocardial and midwall LGE. Recommend cardiac PET to evaluate for sarcoid.  2. Mild LV hypertrophy, normal size, and moderate systolic dysfunction (EF 35%)  3.  Normal RV size and systolic function (EF 64%)  4.  Mild mitral regurgitation (regurgitant fraction 16%)  PET (02/09/23)   FDG uptake was observed. FDG uptake was diffuse. FDG uptake was present in the apical to mid anterior, septal and apex segment(s). LV perfusion is abnormal. Defect  1: There is a large defect with severe reduction in uptake present in the apical anterior, inferior and apex location(s). There is abnormal wall motion in the defect area. Consistent with infarction. Defect 2: There is a small defect with mild reduction in uptake present in the mid septal location(s). There is abnormal wall motion in the defect area. Consistent with infarction.   Left ventricular function is abnormal. There were multiple regional abnormalities. EF: 37 %. Akinesis of the apical septum, apical inferior segment, and apex. Akinesis of the basal to mid septum. End diastolic cavity size is mildly enlarged. End systolic cavity size is mildly enlarged.   Coronary calcium assessment not performed due to prior revascularization.   FDG uptake findings are consistent with active inflammation/sarcoidosis.  ASSESSMENT & PLAN:  Heart failure with reduced EF Etiology of JW:JXBJYNWG cardiomyopathy; hx of STEMI with PCI to the LAD, Lcx and LM; CMR with noncoronary distribution of LGE with active uptake via Cardiac PET.  NYHA class / AHA Stage:NYHAIIB Volume status & Diuretics: Euvolemic, lasix 40mg  PRN only Vasodilators:Entresto 97/103mg  BID Beta-Blocker:metoprolol 25mg  daily NFA:OZHYQMVHQIONGE 50mg  Cardiometabolic:SGLT2i led to yeast infection.  Devices therapies & Valvulopathies:not indicated Advanced therapies:not indicated  2. Evaluation for cardiac sarcoidosis - Will confirm vaccinations. Quantiferon gold today.  - CT chest - lab work today pending  - Will plan to start immunosuppressive therapy within the next 1-3 weeks. Discussing possibility of cellcept with pharmacy.  Rowen Hur Advanced Heart Failure Mechanical Circulatory Support

## 2023-03-26 NOTE — Patient Instructions (Addendum)
There has been no changes to your medications.  Labs done today, your results will be available in MyChart, we will contact you for abnormal readings.  Non-Cardiac CT scanning, (CAT scanning), is a noninvasive, special x-ray that produces cross-sectional images of the body using x-rays and a computer. CT scans help physicians diagnose and treat medical conditions. For some CT exams, a contrast material is used to enhance visibility in the area of the body being studied. CT scans provide greater clarity and reveal more details than regular x-ray exams.  ONCE APPROVED BY YOUR INSURANCE COMPANY YOU WILL BE CALLED TO HAVE THE TEST ARRANGED.  Please follow up with our heart failure pharmacist in 3 weeks  Your physician recommends that you schedule a follow-up appointment in: 6 weeks  If you have any questions or concerns before your next appointment please send [place] a message through [place] or call our office at [phone].    TO LEAVE A MESSAGE FOR THE NURSE SELECT OPTION 2, PLEASE LEAVE A MESSAGE INCLUDING: YOUR NAME DATE OF BIRTH CALL BACK NUMBER REASON FOR CALL**this is important as we prioritize the call backs  YOU WILL RECEIVE A CALL BACK THE SAME DAY AS LONG AS YOU CALL BEFORE 4:00 PM  At the Advanced [facility] Failure Clinic, you and your health needs are our priority. As part of our continuing mission to provide you with exceptional heart care, we have created designated Provider Care Teams. These Care Teams include your primary Cardiologist (physician) and Advanced Practice Providers (APPs- Physician Assistants and Nurse Practitioners) who all work together to provide you with the care you need, when you need it.   You may see any of the following providers on your designated Care Team at your next follow up: Dr Arvilla Meres Dr Marca Ancona Dr. Marcos Eke, NP Robbie Lis, [place] [facility] [place], [place] Brynda Peon, NP Karle Plumber,  PharmD   Please be sure to bring in all your medications bottles to every appointment.    Thank you for choosing [facility]

## 2023-03-26 NOTE — Progress Notes (Signed)
Medication Samples have been provided to the patient.  Drug name: Jardiance       Strength: 10 mg        Qty: 3 boxes  LOT: [id number]  Exp.Date: 01/26  Dosing instructions: take 1 tablet daily  The patient has been instructed regarding the correct time, dose, and frequency of taking this medication, including desired effects and most common side effects.   Marie Tanner 10:14 AM 03/26/2023

## 2023-03-28 ENCOUNTER — Other Ambulatory Visit (HOSPITAL_BASED_OUTPATIENT_CLINIC_OR_DEPARTMENT_OTHER): Payer: Self-pay | Admitting: Cardiovascular Disease

## 2023-03-29 ENCOUNTER — Telehealth (HOSPITAL_COMMUNITY): Payer: Self-pay | Admitting: Pharmacist

## 2023-03-29 NOTE — Telephone Encounter (Signed)
Spoke with patient via telephone today in regards to vaccination history. Patient is up to date on vaccinations except for second Shingrix vaccination and COVID-19 booster. Patient is willing to get both vaccinations this week. Pharmacy visit rescheduled to 04/19/23 to give time for full immune response from vaccinations before starting immunosuppressive therapy.  Thank you for involving pharmacy in this patient's care.  Enos Fling, PharmD PGY2 Pharmacy Resident 03/29/2023 1:23 PM

## 2023-03-30 LAB — QUANTIFERON-TB GOLD PLUS (RQFGPL)
QuantiFERON Mitogen Value: >10 IU/mL
QuantiFERON Nil Value: 0.02 IU/mL
QuantiFERON TB1 Ag Value: 0.04 IU/mL
QuantiFERON TB2 Ag Value: 0.01 IU/mL

## 2023-03-30 LAB — QUANTIFERON-TB GOLD PLUS: QuantiFERON-TB Gold Plus: NEGATIVE

## 2023-03-31 ENCOUNTER — Telehealth: Payer: Self-pay | Admitting: *Deleted

## 2023-04-06 ENCOUNTER — Ambulatory Visit (HOSPITAL_COMMUNITY): Payer: 59

## 2023-04-13 ENCOUNTER — Other Ambulatory Visit (HOSPITAL_COMMUNITY): Payer: 59

## 2023-04-13 MED ORDER — TRULICITY 3 MG/0.5ML ~~LOC~~ SOAJ: 3.0000 mg | SUBCUTANEOUS | 1 refills | Status: DC

## 2023-04-13 NOTE — Addendum Note (Signed)
Addended by: Cheree Ditto on: 04/13/2023 12:36 PM   Modules accepted: Orders

## 2023-04-15 ENCOUNTER — Encounter (HOSPITAL_COMMUNITY): Payer: Self-pay

## 2023-04-15 ENCOUNTER — Other Ambulatory Visit (INDEPENDENT_AMBULATORY_CARE_PROVIDER_SITE_OTHER): Payer: 59

## 2023-04-15 ENCOUNTER — Ambulatory Visit (INDEPENDENT_AMBULATORY_CARE_PROVIDER_SITE_OTHER): Payer: 59 | Admitting: Orthopaedic Surgery

## 2023-04-15 DIAGNOSIS — M25532 Pain in left wrist: Secondary | ICD-10-CM | POA: Diagnosis not present

## 2023-04-15 MED ORDER — LIDOCAINE HCL 1 % IJ SOLN
1.0000 mL | INTRAMUSCULAR | Status: AC | PRN
  Administered 2023-04-15: 1 mL

## 2023-04-15 MED ORDER — BUPIVACAINE HCL 0.5 % IJ SOLN
1.0000 mL | INTRAMUSCULAR | Status: AC | PRN
  Administered 2023-04-15: 1 mL via INTRA_ARTICULAR

## 2023-04-15 MED ORDER — METHYLPREDNISOLONE ACETATE 40 MG/ML IJ SUSP
40.0000 mg | INTRAMUSCULAR | Status: AC | PRN
  Administered 2023-04-15: 40 mg via INTRA_ARTICULAR

## 2023-04-15 NOTE — Progress Notes (Signed)
Office Visit Note   Patient: Marie Tanner           Date of Birth: 06-Apr-1956           MRN: [id number] Visit Date: 04/15/2023              Requested by: Quita Skye, PA-C [address] [place],  [place] [address] PCP: Quita Skye, PA-C   Assessment & Plan: Visit Diagnoses:  1. Pain in left wrist     Plan: Impression is left wrist radiocarpal osteoarthritis flare.  Does not seem to be aggressive enough to be a gout attack.  Based on her options she elected to try a left wrist injection and we will immobilize with a wrist brace for couple weeks and wean as tolerated.  She will follow-up if symptoms persist.  Follow-Up Instructions: No follow-ups on file.   Orders:  Orders Placed This Encounter  Procedures   XR Wrist Complete Left   No orders of the defined types were placed in this encounter.     Procedures: Medium Joint Inj: L radiocarpal on 04/15/2023 10:53 AM Medications: 40 mg methylPREDNISolone acetate 40 MG/ML; 1 mL lidocaine 1 %; 1 mL bupivacaine 0.5 %      Clinical Data: No additional findings.   Subjective: Chief Complaint  Patient presents with   Left Wrist - Pain    HPI Marie Tanner is a 67 year old female who comes in for left wrist pain and swelling.  Denies any injuries.  Denies any numbness and tingling.  States that she has had gout in the distant past.  Denies any constitutional symptoms. Review of Systems  Constitutional: Negative.   HENT: Negative.    Eyes: Negative.   Respiratory: Negative.    Cardiovascular: Negative.   Endocrine: Negative.   Musculoskeletal: Negative.   Neurological: Negative.   Hematological: Negative.   Psychiatric/Behavioral: Negative.    All other systems reviewed and are negative.    Objective: Vital Signs: There were no vitals taken for this visit.  Physical Exam Vitals and nursing note reviewed.  Constitutional:      Appearance: She is well-developed.  HENT:     Head: Atraumatic.     Nose: Nose  normal.  Eyes:     Extraocular Movements: Extraocular movements intact.  Cardiovascular:     Pulses: Normal pulses.  Pulmonary:     Effort: Pulmonary effort is normal.  Abdominal:     Palpations: Abdomen is soft.  Musculoskeletal:     Cervical back: Neck supple.  Skin:    General: Skin is warm.     Capillary Refill: Capillary refill takes less than 2 seconds.  Neurological:     Mental Status: She is alert. Mental status is at baseline.  Psychiatric:        Behavior: Behavior normal.        Thought Content: Thought content normal.        Judgment: Judgment normal.     Ortho Exam Examination of left wrist and hand shows diffuse swelling that is mild.  She has pain with wrist range of motion more towards the radial side.  Negative Finkelstein's.  Negative thumb CMC grind test. Specialty Comments:  No specialty comments available.  Imaging: XR Wrist Complete Left  Result Date: 04/15/2023 Degenerative arthritis of the radiocarpal joint worst between scaphoid and radial styloid.    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   Stented coronary artery 03/17/2023  Coronary artery disease involving native coronary artery of native heart without angina pectoris 10/13/2022   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) 04/06/2022   Type 2 diabetes mellitus with hyperglycemia, without long-term current use of insulin (HCC) 04/06/2022   VT (ventricular tachycardia) (HCC) 04/06/2022   Class 2 severe obesity due to excess calories with serious comorbidity and body mass index (BMI) of 36.0 to 36.9 in adult [facility]) 04/06/2022   Right hip pain 04/03/2021   Palpitations 12/12/2020   Allergic urticaria 10/31/2019   Dermographia 10/31/2019   Other allergic rhinitis 10/31/2019   Current use of beta blocker 10/31/2019   Allergy to insect bites 10/31/2019   Hx of colonic polyp 01/03/2018   Angina, class III (HCC) 12/07/2017   Abnormal  nuclear stress test 12/07/2017   Type II diabetes mellitus (HCC)    Sleep apnea    Renal disorder    History of gout    Heart murmur    GERD (gastroesophageal reflux disease)    CAD S/P percutaneous coronary angioplasty    Cancer (HCC)    Breast cancer (HCC)    Hyperlipidemia LDL goal <70 11/16/2016   History of breast cancer 01/24/2016   Abnormal finding on mammography 01/17/2016   Chest pain 06/06/2015   Diarrhea 06/06/2015   Essential hypertension 06/06/2015   Depression    Encounter for therapeutic drug monitoring 02/19/2015   Mitral regurgitation 01/30/2015   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) 01/30/2015   Chronic combined systolic and diastolic heart failure (HCC) 01/30/2015   Dyspnea 11/24/2014   Cardiomyopathy (HCC) 11/24/2014   DVT (deep venous thrombosis) (HCC) 11/24/2014   DM type 2 (diabetes mellitus, type 2) (HCC) 11/24/2014   HCAP (healthcare-associated pneumonia) 11/22/2014   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) 11/04/2014   Controlled type 2 diabetes mellitus without complication (HCC) 10/31/2014   High sodium levels 10/29/2014   Elevated WBC count 10/29/2014   Acute confusion due to medical condition 10/27/2014   Insomnia due to medical condition 10/27/2014   Acute respiratory failure (HCC) 10/24/2014   Injury of kidney 10/20/2014   Laceration of liver 10/20/2014   STEMI (ST elevation myocardial infarction) (HCC) 10/19/2014   Pulmonary edema 10/19/2014   Liver laceration 10/19/2014   History of blood transfusion 09/30/2014   Diabetes mellitus (HCC) 04/19/2013   Past Medical History:  Diagnosis Date   Breast cancer (HCC)    Cancer (HCC)    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   GERD (gastroesophageal reflux disease)    HCAP  (healthcare-associated pneumonia) 11/22/2014   Heart murmur    High cholesterol    History of blood transfusion 09/2014   related to MI   History of gout    Hypertension    Liver laceration 10/19/2014   Palpitations 12/12/2020   Pulmonary edema 10/19/2014   Renal disorder    Renal disorder    Right hip pain 04/03/2021   Sleep apnea    "suppose to wear mask but I don't" (11/22/2014)   STEMI (ST elevation myocardial infarction) (HCC) 10/19/2014   Type II diabetes mellitus (HCC)    Urticaria     Family History  Problem Relation Age of Onset   Diabetes Mother    Hypertension Mother    COPD Mother    Heart disease Father  Heart attack Father    Alzheimer's disease Father    Diabetes Brother    Asthma Daughter    Stroke Neg Hx    Colon cancer Neg Hx    Stomach cancer Neg Hx    Pancreatic cancer Neg Hx    AAA (abdominal aortic aneurysm) Neg Hx     Past Surgical History:  Procedure Laterality Date   ABDOMINAL HYSTERECTOMY  ~ 2000   APPLICATION OF WOUND VAC  10/2014   "over my naval" (11/22/2014)   BREAST LUMPECTOMY Right 2017   BREAST LUMPECTOMY WITH AXILLARY LYMPH NODE BIOPSY     BREAST SURGERY     CESAREAN SECTION  1980; 1982   CORONARY ANGIOPLASTY WITH STENT PLACEMENT  09/2014   "multiple"/notes 11/22/2014   EXTRACORPOREAL CIRCULATION     Femerol artery repair Right 09/2014   pt insists it was a right femoral artery repair on 11/22/2014   LAPAROSCOPIC ABDOMINAL EXPLORATION     LEFT HEART CATH AND CORONARY ANGIOGRAPHY N/A 12/07/2017   Procedure: LEFT HEART CATH AND CORONARY ANGIOGRAPHY;  Surgeon: Marykay Lex, MD;  Location: [facility] INVASIVE CV LAB;  Service: Cardiovascular;  Laterality: N/A;   LEFT VENTRICULAR ASSIST DEVICE     pt is not aware of this hx on 11/22/2014   Liver laceration repair  09/2014   related to CPR/notes 11/22/2014   RIGHT/LEFT HEART CATH AND CORONARY ANGIOGRAPHY N/A 04/21/2021   Procedure: RIGHT/LEFT HEART CATH AND CORONARY ANGIOGRAPHY;   Surgeon: Yvonne Kendall, MD;  Location: [facility] INVASIVE CV LAB;  Service: Cardiovascular;  Laterality: N/A;   TEE WITHOUT CARDIOVERSION N/A 03/13/2021   Procedure: TRANSESOPHAGEAL ECHOCARDIOGRAM (TEE);  Surgeon: Parke Poisson, MD;  Location: [facility] ENDOSCOPY;  Service: Cardiology;  Laterality: N/A;   TRACHEOSTOMY  09/2014   "closed on it's own when they took it out"   TUBAL LIGATION  1982   Social History   Occupational History   Not on file  Tobacco Use   Smoking status: Never   Smokeless tobacco: Never  Vaping Use   Vaping Use: Never used  Substance and Sexual Activity   Alcohol use: Yes    Comment: occ   Drug use: No   Sexual activity: Not Currently

## 2023-04-15 NOTE — Telephone Encounter (Signed)
Jasmine can you verify approval? And or can we change imaging location?

## 2023-04-16 ENCOUNTER — Encounter: Payer: Self-pay | Admitting: Orthopaedic Surgery

## 2023-04-16 ENCOUNTER — Other Ambulatory Visit: Payer: Self-pay | Admitting: Physician Assistant

## 2023-04-16 MED ORDER — TRAMADOL HCL 50 MG PO TABS: 50.0000 mg | ORAL_TABLET | Freq: Three times a day (TID) | ORAL | 0 refills | Status: DC | PRN

## 2023-04-16 NOTE — Telephone Encounter (Signed)
Sent in tramadol

## 2023-04-19 ENCOUNTER — Telehealth (HOSPITAL_COMMUNITY): Payer: Self-pay | Admitting: Pharmacist

## 2023-04-19 ENCOUNTER — Other Ambulatory Visit (HOSPITAL_COMMUNITY): Payer: Self-pay

## 2023-04-19 ENCOUNTER — Inpatient Hospital Stay (HOSPITAL_COMMUNITY)
Admission: RE | Admit: 2023-04-19 | Discharge: 2023-04-19 | Disposition: A | Discharge status: Home / Self Care | Payer: 59 | Source: Ambulatory Visit

## 2023-04-19 NOTE — Telephone Encounter (Signed)
@   Dr Estill Bakes- ok to change imaging location to [facility] in [place] for insurance purposes?  Test:CT chest  Reason: cardiac sarcoid

## 2023-04-19 NOTE — Telephone Encounter (Signed)
Patient did not show to today's visit. Called patient to reschedule so that cardiac sarcoidosis regimen can be started prior to MD visit. Appointment scheduled for 04/29/23. Will obtain CBC with diff at that time.  Thank you for involving pharmacy in this patient's care.  Enos Fling, PharmD PGY2 Pharmacy Resident 04/19/2023 3:28 PM

## 2023-04-26 ENCOUNTER — Encounter (HOSPITAL_BASED_OUTPATIENT_CLINIC_OR_DEPARTMENT_OTHER): Payer: Self-pay | Admitting: Cardiovascular Disease

## 2023-04-27 MED ORDER — TICAGRELOR 60 MG PO TABS: 60.0000 mg | ORAL_TABLET | Freq: Two times a day (BID) | ORAL | 3 refills | Status: DC

## 2023-04-29 ENCOUNTER — Other Ambulatory Visit (HOSPITAL_COMMUNITY): Payer: Self-pay

## 2023-04-29 ENCOUNTER — Ambulatory Visit (HOSPITAL_COMMUNITY)
Admission: RE | Admit: 2023-04-29 | Discharge: 2023-04-29 | Disposition: A | Discharge status: Home / Self Care | Payer: 59 | Source: Ambulatory Visit | Attending: Internal Medicine | Admitting: Internal Medicine

## 2023-04-29 ENCOUNTER — Telehealth (HOSPITAL_COMMUNITY): Payer: Self-pay | Admitting: Pharmacist

## 2023-04-29 VITALS — BP 134/76 | HR 62 | Wt 195.6 lb

## 2023-04-29 DIAGNOSIS — Z8679 Personal history of other diseases of the circulatory system: Secondary | ICD-10-CM | POA: Diagnosis not present

## 2023-04-29 DIAGNOSIS — I5022 Chronic systolic (congestive) heart failure: Secondary | ICD-10-CM | POA: Diagnosis present

## 2023-04-29 DIAGNOSIS — Z86718 Personal history of other venous thrombosis and embolism: Secondary | ICD-10-CM | POA: Insufficient documentation

## 2023-04-29 DIAGNOSIS — M7989 Other specified soft tissue disorders: Secondary | ICD-10-CM | POA: Insufficient documentation

## 2023-04-29 DIAGNOSIS — I110 Hypertensive heart disease with heart failure: Secondary | ICD-10-CM | POA: Insufficient documentation

## 2023-04-29 DIAGNOSIS — I340 Nonrheumatic mitral (valve) insufficiency: Secondary | ICD-10-CM | POA: Diagnosis not present

## 2023-04-29 DIAGNOSIS — E118 Type 2 diabetes mellitus with unspecified complications: Secondary | ICD-10-CM | POA: Diagnosis not present

## 2023-04-29 DIAGNOSIS — I252 Old myocardial infarction: Secondary | ICD-10-CM | POA: Insufficient documentation

## 2023-04-29 DIAGNOSIS — I2510 Atherosclerotic heart disease of native coronary artery without angina pectoris: Secondary | ICD-10-CM | POA: Insufficient documentation

## 2023-04-29 DIAGNOSIS — I255 Ischemic cardiomyopathy: Secondary | ICD-10-CM | POA: Insufficient documentation

## 2023-04-29 DIAGNOSIS — D8685 Sarcoid myocarditis: Secondary | ICD-10-CM | POA: Diagnosis not present

## 2023-04-29 DIAGNOSIS — Z79899 Other long term (current) drug therapy: Secondary | ICD-10-CM | POA: Diagnosis not present

## 2023-04-29 LAB — CBC WITH DIFFERENTIAL/PLATELET
Abs Immature Granulocytes: 0.02 10*3/uL (ref 0.00–0.07)
Basophils Absolute: 0 10*3/uL (ref 0.0–0.1)
Basophils Relative: 1 %
Eosinophils Absolute: 0.1 10*3/uL (ref 0.0–0.5)
Eosinophils Relative: 3 %
HCT: 36.3 % (ref 36.0–46.0)
Hemoglobin: 11.8 g/dL — ABNORMAL LOW (ref 12.0–15.0)
Immature Granulocytes: 1 %
Lymphocytes Relative: 45 %
Lymphs Abs: 1.7 10*3/uL (ref 0.7–4.0)
MCH: 26.8 pg (ref 26.0–34.0)
MCHC: 32.5 g/dL (ref 30.0–36.0)
MCV: 82.3 fL (ref 80.0–100.0)
Monocytes Absolute: 0.5 10*3/uL (ref 0.1–1.0)
Monocytes Relative: 14 %
Neutro Abs: 1.4 10*3/uL — ABNORMAL LOW (ref 1.7–7.7)
Neutrophils Relative %: 36 %
Platelets: 269 10*3/uL (ref 150–400)
RBC: 4.41 MIL/uL (ref 3.87–5.11)
RDW: 14.1 % (ref 11.5–15.5)
WBC: 3.7 10*3/uL — ABNORMAL LOW (ref 4.0–10.5)
nRBC: 0 % (ref 0.0–0.2)

## 2023-04-29 LAB — COMPREHENSIVE METABOLIC PANEL
ALT: 14 U/L (ref 0–44)
AST: 22 U/L (ref 15–41)
Albumin: 3.7 g/dL (ref 3.5–5.0)
Alkaline Phosphatase: 56 U/L (ref 38–126)
Anion gap: 5 (ref 5–15)
BUN: 12 mg/dL (ref 8–23)
CO2: 24 mmol/L (ref 22–32)
Calcium: 9.3 mg/dL (ref 8.9–10.3)
Chloride: 107 mmol/L (ref 98–111)
Creatinine, Ser: 0.82 mg/dL (ref 0.44–1.00)
GFR, Estimated: >60 mL/min (ref >60)
Glucose, Bld: 96 mg/dL (ref 70–99)
Potassium: 5.1 mmol/L (ref 3.5–5.1)
Sodium: 136 mmol/L (ref 135–145)
Total Bilirubin: 0.7 mg/dL (ref 0.3–1.2)
Total Protein: 7.7 g/dL (ref 6.5–8.1)

## 2023-04-29 MED ORDER — SACUBITRIL-VALSARTAN 97-103 MG PO TABS: 1.0000 | ORAL_TABLET | Freq: Two times a day (BID) | ORAL | 3 refills | Status: DC

## 2023-04-29 MED ORDER — PREDNISONE 10 MG PO TABS: 20.0000 mg | ORAL_TABLET | Freq: Every day | ORAL | 5 refills | Status: DC

## 2023-04-29 MED ORDER — OYSTER SHELL CALCIUM/D3 500-5 MG-MCG PO TABS: 1.0000 | ORAL_TABLET | Freq: Two times a day (BID) | ORAL | Status: AC

## 2023-04-29 MED ORDER — MYCOPHENOLATE MOFETIL 500 MG PO TABS: 500.0000 mg | ORAL_TABLET | Freq: Two times a day (BID) | ORAL | 5 refills | Status: DC

## 2023-04-29 MED ORDER — SULFAMETHOXAZOLE-TRIMETHOPRIM 800-160 MG PO TABS: 1.0000 | ORAL_TABLET | ORAL | 5 refills | Status: DC

## 2023-04-29 MED ORDER — PANTOPRAZOLE SODIUM 40 MG PO TBEC: 40.0000 mg | DELAYED_RELEASE_TABLET | Freq: Every day | ORAL | 5 refills | Status: DC

## 2023-04-29 MED ORDER — SPIRONOLACTONE 25 MG PO TABS: 25.0000 mg | ORAL_TABLET | Freq: Every day | ORAL | 11 refills | Status: DC

## 2023-04-29 NOTE — Patient Instructions (Addendum)
It was a pleasure seeing you today!  MEDICATIONS: -We are changing your medications today -Start mycophenolate 500 mg (1 tablet) twice daily -Start prednisone 20 mg (2 tablets) daily -Start Bactrim (sulfamethoxazole-trimethoprim) 1 tablet every Monday, Wednesday and Friday -Start Calcium and vitamin D (you can find these over the counter) -Start pantoprazole 40 mg (1 tablet) daily -Call if you have questions about your medications.  LABS: -We will call you if your labs need attention.  NEXT APPOINTMENT: Return to clinic in 1 week with Dr. Gasper Lloyd.  In general, to take care of your heart failure: -Limit your fluid intake to 2 Liters (half-gallon) per day.   -Limit your salt intake to ideally 2-3 grams (2000-3000 mg) per day. -Weigh yourself daily and record, and bring that "weight diary" to your next appointment.  (Weight gain of 2-3 pounds in 1 day typically means fluid weight.) -The medications for your heart are to help your heart and help you live longer.   -Please contact us before stopping any of your heart medications.  Call the clinic at [phone] with questions or to reschedule future appointments.

## 2023-04-29 NOTE — Progress Notes (Signed)
Advanced Heart Failure Clinic Note    Referring Physician: Quita Skye, PA-C  Primary Care: Quita Skye, PA-C Primary Cardiologist: Dorthula Nettles, DO  HPI:  Marie Tanner is a 67 y.o. female with coronary artery disease status post STEMI, hypertension, chronic systolic heart failure, obstructive sleep apnea not on CPAP, type 2 diabetes, history of DVT, breast cancer. It appears that Marie Tanner's cardiac history dates back to 2015 when she had a STEMI complicated by cardiac arrest at [facility].  She received 2 stents to the left main/LAD and circumflex.  During this time she had a prolonged hospitalization requiring tracheostomy; liver laceration during CPR which required surgical repair and also developed a DVT during this time.  She underwent repeat left heart cath in January 2019 that demonstrated patent stents which were heavily calcified with other areas of nonobstructive CAD.  EF at that time was 35 to 45%.  With increasing GDMT she had improvement in EF by late 2019 to 40 to 50%.  Due to moderate to severe eccentric mitral regurgitation she had a TEE in 2022 and repeat right and left heart cath with structural evaluation in May 2022 that showed patent left main and LAD stents normal filling pressures and a normal transmitral gradient.  She was deferred for medical management only.  At some point she had a cardiac MRI with noncoronary distribution of LGE followed by a cardiac PET that showed FDG uptake consistent with active inflammation/sarcoid.   On 03/26/2023 she returned for visit with MD. From a functional standpoint, Marie Tanner reported feeling overall "okay". She mostly felt limited by fatigue and very infrequently had shortness of breath. 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 She reported feeling palpitations once monthly at most.   Today she returns to HF clinic to initiate immunosuppression for  cardiac sarcoidosis. At last visit with MD, no medication changes were made but she was instructed to go to her pharmacy to get her 2nd Shingrix vaccine and COVID-19 booster in anticipation of starting immunosuppression.  Since last visit she has been feeling pretty good. No dizziness, lightheadedness, fatigue, or CP reported. Did note some palpitations last night. No SOB/DOE. She does not check her weight at home as her scale broke. Patient mentioned some lower leg swelling recently which dissipated when sitting down and propping legs up. States this is likely due to being on her feet more. She has Lasix 40 mg PRN and has not needed to take any tablets.  No LEE on exam today. No PND or orthopnea. Patient's appetite has been good, she does not normally eat 3 meals a day and is trying to follow a low-salt diet. Patient reports some issues getting Trulicity and Repatha due to the pharmacy being out of stock.  HF Medications: metoprolol succinate 25 mg daily Entresto 97/103 mg BID spironolactone 50 mg daily Lasix 40 mg PRN  Has the patient been experiencing any side effects to the medications prescribed?  No  Does the patient have any problems obtaining medications due to transportation or finances?   No, Arts development officer insurance/Medicaid. Of note, dispense [facility] and [facility] indicates patient has not filled Entresto since January (30 d supply). Patient stated she has been taking the [facility] and [facility]. [facility] and they said Sherryll Burger requires a PA through her "[employer] Pharmacy patient advocate was able to give [facility] the correct [employer] and  Medicaid insurance information and Sherryll Burger went through for $4.00.   Understanding of regimen: good Understanding of indications: good Potential of compliance: fair Patient understands to avoid NSAIDs. Patient understands to avoid decongestants.    Pertinent Lab Values: 03/26/2023: Serum creatinine 0.87, BUN 14,  Potassium 4.9, Sodium 139 CMET, CBC/Diff, G6PD level pending today  Vital Signs: Weight: 195.6 lbs (last clinic weight: 197 lbs) Blood pressure: 134/76 mmHg  Heart rate: 62 bpm   Assessment/Plan: 1. Heart failure with reduced EF Etiology of ZO:XWRUEAVW cardiomyopathy; hx of STEMI with PCI to the LAD, Lcx and LM; CMR with noncoronary distribution of LGE with active uptake via Cardiac PET.  NYHA class / AHA Stage: NYHA class IIB Volume status & Diuretics: Euvolemic on exam today, continue lasix 40mg  PRN only Beta-Blocker:continue metoprolol succinate 25 mg daily Vasodilators: continue Entresto 97/103 mg BID UJW:[id number] spironolactone 50mg  daily Cardiometabolic:has not tolerated SGLT2i in the past (yeast infection). Retried recently through samples and stated the Jardiance made her feel poorly, so she discontinued again.   Devices therapies & Valvulopathies:not indicated Advanced therapies:not indicated   2. Evaluation for cardiac sarcoidosis - Start immunosuppression for cardiac sarcoidosis.  - Start mycophenolate mofetil (Cellcept) 500 mg BID. Plan to increase to 1000 mg BID at follow up if GI side effects are not limiting.  - Start Prednisone 20 mg daily. Plan to decrease to 15 mg daily at HF follow up visit in 1 month.  - Start Bactrim DS MWF for PJP prophylaxis while on doses of prednisone >15 mg daily.  - start pantoprazole 40 mg daily and calcium, Vitamin D while on prednisone.  - CMET, CBC with diff pending. Will also draw G6PD level in case she doesn't tolerate Bactrim and we need to switch to dapsone.   Follow up on 05/06/2023 with Dr. Gasper Lloyd.   Karle Plumber, PharmD, BCPS, [facility], CPP Heart Failure Clinic Pharmacist [phone]

## 2023-04-29 NOTE — Telephone Encounter (Signed)
CMET from 04/29/23 returned with slightly elevated potassium at 5.1. Will decrease spironolactone to 25 mg daily since we are also starting Bactrim today for PJP ppx, which can increase potassium level. Patient will be seen in clinic next week and will recheck BMET at that time.   [facility].   Karle Plumber, PharmD, BCPS, BCCP, CPP Heart Failure Clinic Pharmacist [phone]

## 2023-04-30 LAB — GLUCOSE 6 PHOSPHATE DEHYDROGENASE
G6PDH: 5.8 U/g{Hb} (ref 4.8–15.7)
Hemoglobin: 12.3 g/dL (ref 11.1–15.9)

## 2023-05-06 ENCOUNTER — Encounter (HOSPITAL_COMMUNITY): Payer: Self-pay | Admitting: Cardiology

## 2023-05-06 ENCOUNTER — Ambulatory Visit (HOSPITAL_COMMUNITY)
Admission: RE | Admit: 2023-05-06 | Discharge: 2023-05-06 | Disposition: A | Discharge status: Home / Self Care | Payer: Medicaid Other | Source: Ambulatory Visit | Attending: Cardiology | Admitting: Cardiology

## 2023-05-06 VITALS — BP 102/60 | HR 50 | Wt 197.2 lb

## 2023-05-06 DIAGNOSIS — G4733 Obstructive sleep apnea (adult) (pediatric): Secondary | ICD-10-CM | POA: Diagnosis not present

## 2023-05-06 DIAGNOSIS — Z7985 Long-term (current) use of injectable non-insulin antidiabetic drugs: Secondary | ICD-10-CM | POA: Insufficient documentation

## 2023-05-06 DIAGNOSIS — Z79624 Long term (current) use of inhibitors of nucleotide synthesis: Secondary | ICD-10-CM | POA: Diagnosis not present

## 2023-05-06 DIAGNOSIS — I255 Ischemic cardiomyopathy: Secondary | ICD-10-CM | POA: Diagnosis not present

## 2023-05-06 DIAGNOSIS — Z7902 Long term (current) use of antithrombotics/antiplatelets: Secondary | ICD-10-CM | POA: Diagnosis not present

## 2023-05-06 DIAGNOSIS — Z5181 Encounter for therapeutic drug level monitoring: Secondary | ICD-10-CM | POA: Diagnosis not present

## 2023-05-06 DIAGNOSIS — Z955 Presence of coronary angioplasty implant and graft: Secondary | ICD-10-CM | POA: Insufficient documentation

## 2023-05-06 DIAGNOSIS — Z79899 Other long term (current) drug therapy: Secondary | ICD-10-CM | POA: Diagnosis not present

## 2023-05-06 DIAGNOSIS — Z7952 Long term (current) use of systemic steroids: Secondary | ICD-10-CM | POA: Diagnosis not present

## 2023-05-06 DIAGNOSIS — E119 Type 2 diabetes mellitus without complications: Secondary | ICD-10-CM | POA: Diagnosis not present

## 2023-05-06 DIAGNOSIS — E7800 Pure hypercholesterolemia, unspecified: Secondary | ICD-10-CM | POA: Insufficient documentation

## 2023-05-06 DIAGNOSIS — I2510 Atherosclerotic heart disease of native coronary artery without angina pectoris: Secondary | ICD-10-CM | POA: Insufficient documentation

## 2023-05-06 DIAGNOSIS — I252 Old myocardial infarction: Secondary | ICD-10-CM | POA: Diagnosis not present

## 2023-05-06 DIAGNOSIS — Z7984 Long term (current) use of oral hypoglycemic drugs: Secondary | ICD-10-CM | POA: Insufficient documentation

## 2023-05-06 DIAGNOSIS — Z7982 Long term (current) use of aspirin: Secondary | ICD-10-CM | POA: Diagnosis not present

## 2023-05-06 DIAGNOSIS — I5042 Chronic combined systolic (congestive) and diastolic (congestive) heart failure: Secondary | ICD-10-CM | POA: Insufficient documentation

## 2023-05-06 DIAGNOSIS — K219 Gastro-esophageal reflux disease without esophagitis: Secondary | ICD-10-CM | POA: Insufficient documentation

## 2023-05-06 DIAGNOSIS — D8685 Sarcoid myocarditis: Secondary | ICD-10-CM | POA: Diagnosis not present

## 2023-05-06 DIAGNOSIS — Z9861 Coronary angioplasty status: Secondary | ICD-10-CM

## 2023-05-06 DIAGNOSIS — I110 Hypertensive heart disease with heart failure: Secondary | ICD-10-CM | POA: Diagnosis not present

## 2023-05-06 LAB — COMPREHENSIVE METABOLIC PANEL
ALT: 17 U/L (ref 0–44)
AST: 17 U/L (ref 15–41)
Albumin: 3.6 g/dL (ref 3.5–5.0)
Alkaline Phosphatase: 54 U/L (ref 38–126)
Anion gap: 6 (ref 5–15)
BUN: 19 mg/dL (ref 8–23)
CO2: 26 mmol/L (ref 22–32)
Calcium: 9.4 mg/dL (ref 8.9–10.3)
Chloride: 106 mmol/L (ref 98–111)
Creatinine, Ser: 0.89 mg/dL (ref 0.44–1.00)
GFR, Estimated: >60 mL/min (ref >60)
Glucose, Bld: 86 mg/dL (ref 70–99)
Potassium: 4.8 mmol/L (ref 3.5–5.1)
Sodium: 138 mmol/L (ref 135–145)
Total Bilirubin: 0.7 mg/dL (ref 0.3–1.2)
Total Protein: 7.4 g/dL (ref 6.5–8.1)

## 2023-05-06 LAB — BRAIN NATRIURETIC PEPTIDE: B Natriuretic Peptide: 218.4 pg/mL — ABNORMAL HIGH (ref 0.0–100.0)

## 2023-05-06 MED ORDER — PREDNISONE 10 MG PO TABS: 15.0000 mg | ORAL_TABLET | Freq: Every day | ORAL | 5 refills | Status: DC

## 2023-05-06 NOTE — Patient Instructions (Signed)
In 10 days(June 16) DECREASE Prednisone to 15 mg daily  Labs done today, your results will be available in MyChart, we will contact you for abnormal readings.  Please follow up with our heart failure pharmacist as scheduled  Your physician recommends that you schedule a follow-up appointment in: 2 months   If you have any questions or concerns before your next appointment please send [place] a message through [place] or call our office at [phone].    TO LEAVE A MESSAGE FOR THE NURSE SELECT OPTION 2, PLEASE LEAVE A MESSAGE INCLUDING: YOUR NAME DATE OF BIRTH CALL BACK NUMBER REASON FOR CALL**this is important as we prioritize the call backs  YOU WILL RECEIVE A CALL BACK THE SAME DAY AS LONG AS YOU CALL BEFORE 4:00 PM  At the Advanced Heart Failure Clinic, you and your health needs are our priority. As part of our continuing mission to provide you with exceptional heart care, we have created designated Provider Care Teams. These Care Teams include your primary Cardiologist (physician) and Advanced Practice Providers (APPs- Physician Assistants and Nurse Practitioners) who all work together to provide you with the care you need, when you need it.   You may see any of the following providers on your designated Care Team at your next follow up: Dr Arvilla Meres Dr Marca Ancona Dr. Marcos Eke, NP Robbie Lis, [place] [facility] [place], [place] Brynda Peon, NP Karle Plumber, PharmD   Please be sure to bring in all your medications bottles to every appointment.    Thank you for choosing [facility]

## 2023-05-06 NOTE — Progress Notes (Signed)
ADVANCED HEART FAILURE CLINIC NOTE  Referring Physician: Quita Skye, PA-C  Primary Care: Quita Skye, PA-C Primary Cardiologist: Dr. Gasper Lloyd  HPI: Marie Tanner is a 67 y.o. female with coronary artery disease status post STEMI, hypertension, chronic systolic heart failure, obstructive sleep apnea not on CPAP, type 2 diabetes, history of DVT, breast cancer that presents today to establish care.  It appears that Marie Tanner's cardiac history dates back to 2015 when she had a STEMI complicated by cardiac arrest at [facility].  She received 2 stents to the left main/LAD and circumflex.  During this time she had a prolonged hospitalization requiring tracheostomy; liver laceration during CPR which required surgical repair and also developed a DVT during this time.  She underwent repeat left heart cath in January 2019 that demonstrated patent stents which were heavily calcified with other areas of nonobstructive CAD.  EF at that time was 35 to 45%.  With increasing GDMT she had improvement in EF by late 2019 to 40 to 50%.  Due to moderate to severe eccentric mitral regurgitation she had a TEE in 2022 and repeat right and left heart cath with structural evaluation in May 2022 that showed patent left main and LAD stents normal filling pressures and a normal transmitral gradient.  She was deferred for medical management only.  At some point she had a cardiac MRI with noncoronary distribution of LGE followed by a cardiac PET that showed FDG uptake consistent with active inflammation/sarcoid.  Interval hx:  Reports feeling very well from a functional standpoint; reports feeling a "little tired". She is now working on her food truck on Fridays only. No chest pain, SOB, LE edema or lightheadedness. Started prednisone 1 week ago. Has not started cellcept yet; awaiting delivery in the mail.   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   GERD (gastroesophageal reflux disease)    HCAP (healthcare-associated pneumonia) 11/22/2014   Heart murmur    High cholesterol    History of blood transfusion 09/2014   related to MI   History of gout    Hypertension    Liver laceration 10/19/2014   Palpitations 12/12/2020   Pulmonary edema 10/19/2014   Renal disorder    Renal disorder    Right hip pain 04/03/2021   Sleep apnea    "suppose to wear mask but I don't" (11/22/2014)   STEMI (ST elevation myocardial infarction) (HCC) 10/19/2014   Type II diabetes mellitus (HCC)    Urticaria     Current Outpatient Medications  Medication Sig Dispense Refill   acetaminophen (TYLENOL) 500 MG tablet Take 1,000 mg by mouth every 6 (six) hours as needed for moderate pain.     aspirin EC 81 MG tablet Take 1 tablet (81 mg total) by mouth daily.     blood glucose meter kit and supplies KIT by Does not apply route daily as needed. Dispense based on patient and insurance preference. Use up to four times daily as directed. (FOR ICD-9 250.00, 250.01).  calcium-vitamin D (OSCAL WITH D) 500-5 MG-MCG tablet Take 1 tablet by mouth 2 (two) times daily.     cholecalciferol (VITAMIN D3) 25 MCG (1000 UNIT) tablet Take 1,000 Units by mouth daily.     Dulaglutide (TRULICITY) 3 MG/0.5ML SOPN Inject 3 mg into the skin once a week. 2 mL 1   Evolocumab (REPATHA SURECLICK) 140 MG/ML SOAJ Inject 140 mg into the skin every 14 (fourteen) days. 2 mL 5   furosemide (LASIX) 40 MG tablet TAKE 1 TABLET ONCE PER WEEK WITH  ADDITIONAL TABLET AS NEEDED FOR SHORTNESS OF BREATH OR WEIGHT GAIN OF 2 POUNDS IN 24 HOURS OR 5 POUNDS IN 1 WEEK 30 tablet 3   glipiZIDE (GLUCOTROL XL) 2.5 MG 24 hr tablet Take 1.25 mg by mouth daily with breakfast.     glucose blood test strip Use as instructed. E11.5. To check blood glucose daily. 100 each 12   ketoconazole (NIZORAL) 2 % cream Apply to both feet and between toes once daily for 6 weeks. 60 g 1   magnesium oxide (MAG-OX) 400 MG tablet TAKE 1 TABLET BY MOUTH EVERY DAY 90 tablet 0   metoprolol succinate (TOPROL-XL) 25 MG 24 hr tablet Take 1 tablet (25 mg total) by mouth daily. 90 tablet 3   mycophenolate (CELLCEPT) 500 MG tablet Take 1 tablet (500 mg total) by mouth 2 (two) times daily. 60 tablet 5   nitroGLYCERIN (NITROSTAT) 0.4 MG SL tablet Place 1 tablet (0.4 mg total) under the tongue every 5 (five) minutes as needed for chest pain. max of 3 if no relief call 911 25 tablet PRN   pantoprazole (PROTONIX) 40 MG tablet Take 1 tablet (40 mg total) by mouth daily. 30 tablet 5   predniSONE (DELTASONE) 10 MG tablet Take 2 tablets (20 mg total) by mouth daily with breakfast. 60 tablet 5   rosuvastatin (CRESTOR) 20 MG tablet TAKE 1 TABLET BY MOUTH 3 TIMES PER WEEK 40 tablet 5   sacubitril-valsartan (ENTRESTO) 97-103 MG Take 1 tablet by mouth 2 (two) times daily. 180 tablet 3   spironolactone (ALDACTONE) 25 MG tablet Take 1 tablet (25 mg total) by mouth daily. 30 tablet 11   sulfamethoxazole-trimethoprim (BACTRIM DS) 800-160 MG tablet Take 1 tablet by mouth every Monday, Wednesday, and Friday. 12 tablet 5   ticagrelor (BRILINTA) 60 MG TABS tablet Take 1 tablet (60 mg total) by mouth 2 (two) times daily. 180 tablet 3   Current Facility-Administered Medications  Medication Dose Route Frequency Provider Last Rate Last Admin   sodium chloride flush (NS) 0.9 % injection 3 mL  3 mL Intravenous Q12H Chilton Si, MD        Allergies  Allergen Reactions   Dilaudid [Hydromorphone Hcl]  Anaphylaxis, Itching and Swelling   Epinephrine Anxiety and Other (See Comments)    Severe anxiety   Lactose Intolerance (Gi) Diarrhea   Jardiance [Empagliflozin]     YEAST INFECTION       Social History   Socioeconomic History   Marital status: Divorced    Spouse name: Not on file   Number of children: 2   Years of education: Not on file   Highest education level: Not on file  Occupational History   Not on file  Tobacco Use   Smoking status: Never   Smokeless tobacco: Never  Vaping Use   Vaping Use: Never used  Substance and Sexual Activity   Alcohol use: Yes    Comment: occ   Drug use: No  Sexual activity: Not Currently  Other Topics Concern   Not on file  Social History Narrative   Not on file   Social Determinants of Health   Financial Resource Strain: Not on file  Food Insecurity: Not on file  Transportation Needs: Not on file  Physical Activity: Not on file  Stress: Not on file  Social Connections: Not on file  Intimate Partner Violence: Not on file      Family History  Problem Relation Age of Onset   Diabetes Mother    Hypertension Mother    COPD Mother    Heart disease Father    Heart attack Father    Alzheimer's disease Father    Diabetes Brother    Asthma Daughter    Stroke Neg Hx    Colon cancer Neg Hx    Stomach cancer Neg Hx    Pancreatic cancer Neg Hx    AAA (abdominal aortic aneurysm) Neg Hx     PHYSICAL EXAM: Vitals:   05/06/23 0903  BP: 102/60  Pulse: (!) 50  SpO2: 99%   GENERAL: overweight AAF, NAD HEENT: Negative for arcus senilis or xanthelasma. There is no scleral icterus.  The mucous membranes are pink and moist.   NECK: Supple, No masses. Normal carotid upstrokes without bruits. No masses or thyromegaly.    CHEST: There are no chest wall deformities. There is no chest wall tenderness. Respirations are unlabored.  Lungs- CTA B/L CARDIAC:  JVP: 7 cm          Normal rate with regular rhythm. No murmurs, rubs or gallops.   Pulses are 2+ and symmetrical in upper and lower extremities. No edema.  ABDOMEN: Soft, non-tender, non-distended. There are no masses or hepatomegaly. There are normal bowel sounds.  EXTREMITIES: Warm and well perfused with no cyanosis, clubbing.  LYMPHATIC: No axillary or supraclavicular lymphadenopathy.  NEUROLOGIC: Patient is oriented x3 with no focal or lateralizing neurologic deficits.  PSYCH: Patients affect is appropriate, there is no evidence of anxiety or depression.  SKIN: Warm and dry; no lesions or wounds.    DATA REVIEW  ECG: 03/26/23: NSR  As per my personal interpretation  ECHO: 05/07/22: LVEF 35%-40%, eccentric moderate - severe MR, normal RV function As per my personal interpretation  CATH: 04/21/21:   Nonobstructive coronary artery disease with up to 40% stenosis involving the mid LAD and mid LCx.  Overall appearance is similar to prior catheterization in 2019. Patent LMCA/LAD stent. Normal left and right heart filling pressures. Mild pulmonary hypertension. Normal to supranormal Fick cardiac output/index. Normal transmitral gradient.  CMR:  11/03/22:  1. There is subendocardial LGE in apical anterior and mid to apical inferior walls, which could be consistent with LAD territory infarct as patient has known large wrap-around LAD, but the apex is spared, which argues against this. In addition, there is focal area of subendocardial LGE suggesting infarct in the basal anteroseptum, which is not in a coronary distribution. There is also mid inferoseptal midwall LGE, which is a nonischemic scar pattern. 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 Cardiac sarcoid is on the differential, as can cause both subendocardial and midwall LGE. Recommend cardiac PET to evaluate for sarcoid.  2. Mild LV hypertrophy, normal size, and moderate systolic dysfunction (EF 35%)  3.  Normal RV size and systolic function (EF 64%)  4.   Mild mitral regurgitation (regurgitant fraction 16%)  PET (02/09/23)  FDG uptake was observed. FDG uptake was diffuse. FDG uptake was  present in the apical to mid anterior, septal and apex segment(s). LV perfusion is abnormal. Defect 1: There is a large defect with severe reduction in uptake present in the apical anterior, inferior and apex location(s). There is abnormal wall motion in the defect area. Consistent with infarction. Defect 2: There is a small defect with mild reduction in uptake present in the mid septal location(s). There is abnormal wall motion in the defect area. Consistent with infarction.   Left ventricular function is abnormal. There were multiple regional abnormalities. EF: 37 %. Akinesis of the apical septum, apical inferior segment, and apex. Akinesis of the basal to mid septum. End diastolic cavity size is mildly enlarged. End systolic cavity size is mildly enlarged.   Coronary calcium assessment not performed due to prior revascularization.   FDG uptake findings are consistent with active inflammation/sarcoidosis.  ASSESSMENT & PLAN:  Heart failure with reduced EF Etiology of ZO:XWRUEAVW cardiomyopathy; hx of STEMI with PCI to the LAD, Lcx and LM; CMR with noncoronary distribution of LGE with active uptake via Cardiac PET.  NYHA class / AHA Stage:NYHAIIB Volume status & Diuretics: Euvolemic, lasix 40mg  PRN only Vasodilators:Entresto 97/103mg  BID Beta-Blocker:metoprolol 25mg  daily UJW:[id number] 50mg  Cardiometabolic:SGLT2i led to yeast infection. Will continue to hold as she is now on immunosuppressives. Devices therapies & Valvulopathies:not indicated Advanced therapies:not indicated  2. Cardiac sarcoid - Vaccinations confirmed.  - PET from 02/09/23 w/ LVEF 37% - G6PD level wnl - Started prednisone 20mg  daily on 04/29/23 at pharmacy visit - Unfortunately has not received Cellcept yet; planning to start it tomorrow at 500mg  BID.  - Will plan to decrease prednisone  to 15mg  in 1-2 weeks.   Morena Mckissack Advanced Heart Failure Mechanical Circulatory Support

## 2023-05-25 NOTE — Progress Notes (Signed)
Advanced Heart Failure Clinic Note    Referring Physician: Quita Skye, PA-C  Primary Care: Quita Skye, PA-C Primary Cardiologist: Dorthula Nettles, DO  HPI:  Marie Tanner is a 67 y.o. female with coronary artery disease status post STEMI, hypertension, chronic systolic heart failure, obstructive sleep apnea not on CPAP, type 2 diabetes, history of DVT, breast cancer. It appears that Marie Tanner's cardiac history dates back to 2015 when she had a STEMI complicated by cardiac arrest at [facility].  She received 2 stents to the left main/LAD and circumflex.  During this time she had a prolonged hospitalization requiring tracheostomy; liver laceration during CPR which required surgical repair and also developed a DVT during this time.  She underwent repeat left heart cath in January 2019 that demonstrated patent stents which were heavily calcified with other areas of nonobstructive CAD.  EF at that time was 35 to 45%.  With increasing GDMT she had improvement in EF by late 2019 to 40 to 50%.  Due to moderate to severe eccentric mitral regurgitation she had a TEE in 2022 and repeat right and left heart cath with structural evaluation in May 2022 that showed patent left main and LAD stents normal filling pressures and a normal transmitral gradient.  She was deferred for medical management only.  At some point she had a cardiac MRI with noncoronary distribution of LGE followed by a cardiac PET that showed FDG uptake consistent with active inflammation/sarcoid.   Presented to HF Clinic for follow up 05/06/23 with Dr. Gasper Lloyd. Reported feeling very well from a functional standpoint; reported feeling a "little tired". 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 No chest pain, SOB, LE edema or lightheadedness. Had started prednisone 1 week ago. Had not started mycophenolate yet; awaiting delivery in the mail (gets from [facility]).   Today she returns to HF clinic for  follow up of immunosuppression for cardiac sarcoidosis. At last visit with MD, no medication changes were made but she was instructed to decrease prednisone to 15 mg daily in 2 weeks (05/16/23). Overall she is feeling well today. Has had a few episodes of diarrhea after starting mycophenolate but otherwise doing well. Has been more bothered by constipation, for which she uses Miralax. Had an episode of dizziness last Sunday during church, which she thinks was because her blood sugar was too low. She was unable to check her BG during that time. Has been taking glipizide as prescribed, not taking Trulicity because having difficulties getting it from the pharmacy. No other episodes of dizziness. Brief episode of palpitations and "fluttering in her chest" yesterday that resolved quickly. Did have some chest pain last night that lasted a few hours. Pain was sharp and did not radiate. She couldn't find her SL NTG so did not take any. Says she has these types of events ~1x/month or less. SOB with moderate activity. Also gets SOB when carrying extra fluid, for which she will take a dose of Lasix. Does not have a scale at home, but weight is up 1 lb from last clinic visit. She has been using Lasix once a week since starting the prednisone. Has swelling in her feet, but ankles appear normal. This swelling usually resolves over night and she thinks it is due to a combination of the prednisone and being on her feet working. Sleeps on 2 pillows. Did wake up last night SOB, but otherwise no PND. BP at home 156/90 before medications, then 116/62 in afternoon.  HF Medications: metoprolol succinate 25 mg daily Entresto 97/103 mg BID spironolactone 25 mg daily Lasix 40 mg PRN  Has the patient been experiencing any side effects to the medications prescribed?  No  Does the patient have any problems obtaining medications due to transportation or finances?   No, Arts development [employer] insurance/Medicaid. Gets [facility]cophenolate through  [facility]   Understanding of regimen: good Understanding of indications: good Potential of compliance: fair Patient understands to avoid NSAIDs. Patient understands to avoid decongestants.    Pertinent Lab Values: 05/06/23: Serum creatinine 0.89, BUN 19, Potassium 4.8, Sodium 138 CMET, BNP, CBC/Diff level pending today  Vital Signs: Weight: 198.4 lbs (last clinic weight: 197.2 lbs) Blood pressure: 100/60 mmHg  Heart rate: 60 bpm   Assessment/Plan: 1. Heart failure with reduced EF Etiology of ZO:XWRUEAVW cardiomyopathy; hx of STEMI with PCI to the LAD, Lcx and LM; CMR with noncoronary distribution of LGE with active uptake via Cardiac PET.  NYHA class / AHA Stage: NYHA class IIB Volume status & Diuretics: Mildly fluid retention on exam today. Take Lasix 40 mg x2 days then continue lasix 40mg  PRN only (has been using once per week, on Saturdays) Beta-Blocker:continue metoprolol succinate 25 mg daily Vasodilators: continue Entresto 97/103 mg BID UJW:[id number] spironolactone 25 mg daily Cardiometabolic:has not tolerated SGLT2i in the past (yeast infection). Retried recently through samples and stated the Jardiance made her feel poorly, so she discontinued again.   Devices therapies & Valvulopathies:not indicated Advanced therapies:not indicated -CMET, CBC/diff and BNP pending today   2. Evaluation for cardiac sarcoidosis - Vaccinations confirmed.  - PET from 02/09/23 w/ LVEF 37% - G6PD level wnl - Increase mycophenolate mofetil (Cellcept) to 1000 mg BID. Monitor for GI side effects. CBC/diff pending today.  - Continue Prednisone 15 mg daily. Can consider decreasing to 10 mg daily at follow up with Dr. Gasper Lloyd. Can discontinue Bactrim once prednisone dose is 10 mg or less.  - Continue Bactrim DS MWF for PJP prophylaxis while on doses of prednisone >15 mg daily.  - Continue pantoprazole 40 mg daily and calcium, Vitamin D while on prednisone.   3. T2DM -controlled, most recent  A1C 6.4% 04/09/23 -Taking glipizide. [facility]icity on med list but [facility] -She will follow up with PCP as she has had ~2 episodes of hypoglycemia recently. Would ideally be able to stop glipizide and start Trulicity, but would like to may sure can access Trulicity and that she doesn't become hyperglycemic while on prednisone. Informed her that there is currently stock of Trulicity at [facility] at [facility] (as well as other [employer]). Trulicity prescription can be transferred there if continues to have difficulties [facility].    Follow up on 07/08/2023 with Dr. Gasper Lloyd.   Karle Plumber, PharmD, BCPS, [facility], CPP Heart Failure Clinic Pharmacist [phone]

## 2023-06-08 ENCOUNTER — Other Ambulatory Visit (HOSPITAL_BASED_OUTPATIENT_CLINIC_OR_DEPARTMENT_OTHER): Payer: Self-pay | Admitting: Cardiovascular Disease

## 2023-06-10 ENCOUNTER — Other Ambulatory Visit (HOSPITAL_COMMUNITY): Payer: Self-pay

## 2023-06-10 ENCOUNTER — Ambulatory Visit (HOSPITAL_COMMUNITY)
Admission: RE | Admit: 2023-06-10 | Discharge: 2023-06-10 | Disposition: A | Discharge status: Home / Self Care | Payer: 59 | Source: Ambulatory Visit | Attending: Internal Medicine | Admitting: Internal Medicine

## 2023-06-10 VITALS — BP 100/60 | HR 60 | Wt 198.4 lb

## 2023-06-10 DIAGNOSIS — Z794 Long term (current) use of insulin: Secondary | ICD-10-CM | POA: Insufficient documentation

## 2023-06-10 DIAGNOSIS — E119 Type 2 diabetes mellitus without complications: Secondary | ICD-10-CM | POA: Diagnosis not present

## 2023-06-10 DIAGNOSIS — I252 Old myocardial infarction: Secondary | ICD-10-CM | POA: Insufficient documentation

## 2023-06-10 DIAGNOSIS — Z8679 Personal history of other diseases of the circulatory system: Secondary | ICD-10-CM | POA: Diagnosis not present

## 2023-06-10 DIAGNOSIS — R0602 Shortness of breath: Secondary | ICD-10-CM | POA: Insufficient documentation

## 2023-06-10 DIAGNOSIS — D8685 Sarcoid myocarditis: Secondary | ICD-10-CM

## 2023-06-10 DIAGNOSIS — Z79899 Other long term (current) drug therapy: Secondary | ICD-10-CM | POA: Insufficient documentation

## 2023-06-10 DIAGNOSIS — I5022 Chronic systolic (congestive) heart failure: Secondary | ICD-10-CM | POA: Insufficient documentation

## 2023-06-10 DIAGNOSIS — Z86718 Personal history of other venous thrombosis and embolism: Secondary | ICD-10-CM | POA: Insufficient documentation

## 2023-06-10 DIAGNOSIS — I2510 Atherosclerotic heart disease of native coronary artery without angina pectoris: Secondary | ICD-10-CM | POA: Diagnosis not present

## 2023-06-10 DIAGNOSIS — I255 Ischemic cardiomyopathy: Secondary | ICD-10-CM | POA: Diagnosis not present

## 2023-06-10 DIAGNOSIS — I110 Hypertensive heart disease with heart failure: Secondary | ICD-10-CM | POA: Insufficient documentation

## 2023-06-10 LAB — CBC WITH DIFFERENTIAL/PLATELET
Abs Immature Granulocytes: 0.04 10*3/uL (ref 0.00–0.07)
Basophils Absolute: 0 10*3/uL (ref 0.0–0.1)
Basophils Relative: 0 %
Eosinophils Absolute: 0.1 10*3/uL (ref 0.0–0.5)
Eosinophils Relative: 1 %
HCT: 35.2 % — ABNORMAL LOW (ref 36.0–46.0)
Hemoglobin: 11.4 g/dL — ABNORMAL LOW (ref 12.0–15.0)
Immature Granulocytes: 1 %
Lymphocytes Relative: 40 %
Lymphs Abs: 2.7 10*3/uL (ref 0.7–4.0)
MCH: 27.2 pg (ref 26.0–34.0)
MCHC: 32.4 g/dL (ref 30.0–36.0)
MCV: 84 fL (ref 80.0–100.0)
Monocytes Absolute: 0.6 10*3/uL (ref 0.1–1.0)
Monocytes Relative: 9 %
Neutro Abs: 3.3 10*3/uL (ref 1.7–7.7)
Neutrophils Relative %: 49 %
Platelets: 312 10*3/uL (ref 150–400)
RBC: 4.19 MIL/uL (ref 3.87–5.11)
RDW: 15.3 % (ref 11.5–15.5)
WBC: 6.7 10*3/uL (ref 4.0–10.5)
nRBC: 0 % (ref 0.0–0.2)

## 2023-06-10 LAB — COMPREHENSIVE METABOLIC PANEL
ALT: 19 U/L (ref 0–44)
AST: 20 U/L (ref 15–41)
Albumin: 3.5 g/dL (ref 3.5–5.0)
Alkaline Phosphatase: 46 U/L (ref 38–126)
Anion gap: 11 (ref 5–15)
BUN: 19 mg/dL (ref 8–23)
CO2: 26 mmol/L (ref 22–32)
Calcium: 9.5 mg/dL (ref 8.9–10.3)
Chloride: 100 mmol/L (ref 98–111)
Creatinine, Ser: 1.09 mg/dL — ABNORMAL HIGH (ref 0.44–1.00)
GFR, Estimated: 57 mL/min — ABNORMAL LOW (ref >60)
Glucose, Bld: 226 mg/dL — ABNORMAL HIGH (ref 70–99)
Potassium: 4.4 mmol/L (ref 3.5–5.1)
Sodium: 137 mmol/L (ref 135–145)
Total Bilirubin: 0.9 mg/dL (ref 0.3–1.2)
Total Protein: 6.9 g/dL (ref 6.5–8.1)

## 2023-06-10 LAB — BRAIN NATRIURETIC PEPTIDE: B Natriuretic Peptide: 178.2 pg/mL — ABNORMAL HIGH (ref 0.0–100.0)

## 2023-06-10 MED ORDER — MYCOPHENOLATE MOFETIL 500 MG PO TABS: 1000.0000 mg | ORAL_TABLET | Freq: Two times a day (BID) | ORAL | 5 refills | Status: DC

## 2023-06-10 NOTE — Patient Instructions (Addendum)
It was a pleasure seeing you today!  MEDICATIONS: -We are changing your medications today -Increase mycophenolate to 1000 mg (2 tablets) twice daily -Take Lasix 40 mg for 2 days, then resume as needed only.  -Call if you have questions about your medications.  LABS: -We will call you if your labs need attention.  NEXT APPOINTMENT: Return to clinic in 4 weeks with Dr. Gasper Lloyd.  In general, to take care of your heart failure: -Limit your fluid intake to 2 Liters (half-gallon) per day.   -Limit your salt intake to ideally 2-3 grams (2000-3000 mg) per day. -Weigh yourself daily and record, and bring that "weight diary" to your next appointment.  (Weight gain of 2-3 pounds in 1 day typically means fluid weight.) -The medications for your heart are to help your heart and help you live longer.   -Please contact us before stopping any of your heart medications.  Call the clinic at [phone] with questions or to reschedule future appointments.

## 2023-06-21 ENCOUNTER — Ambulatory Visit (INDEPENDENT_AMBULATORY_CARE_PROVIDER_SITE_OTHER): Payer: Medicare Other | Admitting: Family

## 2023-06-21 ENCOUNTER — Encounter (HOSPITAL_BASED_OUTPATIENT_CLINIC_OR_DEPARTMENT_OTHER): Payer: Self-pay | Admitting: Family

## 2023-06-21 VITALS — BP 134/64 | HR 93 | Ht 62.0 in | Wt 205.0 lb

## 2023-06-21 DIAGNOSIS — E785 Hyperlipidemia, unspecified: Secondary | ICD-10-CM | POA: Diagnosis not present

## 2023-06-21 DIAGNOSIS — G4733 Obstructive sleep apnea (adult) (pediatric): Secondary | ICD-10-CM | POA: Diagnosis not present

## 2023-06-21 DIAGNOSIS — D8685 Sarcoid myocarditis: Secondary | ICD-10-CM

## 2023-06-21 DIAGNOSIS — R400 Somnolence: Secondary | ICD-10-CM

## 2023-06-21 DIAGNOSIS — I25118 Atherosclerotic heart disease of native coronary artery with other forms of angina pectoris: Secondary | ICD-10-CM

## 2023-06-21 NOTE — Progress Notes (Signed)
Cardiology Office Note:  .   Date:  06/21/2023  ID:  Marie Tanner, DOB 1955/12/15, MRN [id number] PCP: Quita Skye, PA-C  [facility] Providers Cardiologist:  Chilton Si, MD    History of Present Illness: .   Marie Tanner is a 67 y.o. female with a hx of CAD s/p STEMI 2015 (DES to LM/LAD and LCx), hypertension, chronic systolic and diastolic heart failure, OSA not on CPAP, diabetes, prior DVT, breast cancer, MR last seen by heart failure team 05/06/2023   In 2015 she had STEMI and cardiac arrest.  She was cared for at [facility].  Ultimately received 2 DES to left main/LAD and LCx.  Prolonged hospitalization requiring tracheostomy.  Liver lacerated during CPR.  Required surgical repair after femoral catheterization.  Developed DVT while admitted.  1 week after discharge developed pneumonia and was admitted at [facility].  Negative Myoview 05/2015.  Underwent surgery for breast cancer 02/21/2016.  Completed radiation 07/2016.  Referred to sleep study which was abnormal but declined using CPAP and was fitted for oral airway but this was cost prohibitive.  Echo 06/2016 EF 50-55% with grade 1 diastolic dysfunction.  Repeat Myoview due to chest pain 10/2017 EF 36% with anterior, anteroseptal, apical inferior and apical lateral defects.  LHC 11/2017 with patent stents that were heavily calcified and stepdown after the takeoff of the diagonal.  40% RI, 40% proximal LCx, 20% proximal LAD.  EF 35-45% and LVEDP elevated.  Spironolactone initiated.  ETT 10/2018 negative.   Losartan previously reduced due to dizziness. Bidil switched to hydralazine and Imdur for cost.  On patient assistance for Brilinta and Praluent.  Hydralazine later increased due to elevated blood pressure.  Nuclear stress test 01/2021 EF 39%.  Monitor 12/2020 with 10 beats of NSVT.  Echo 02/2021 LVEF 34-40%, moderate to severe MR.  TEE with EF 45 to 70% and very eccentric anteriorly directed mitral regurgitation jet.  She was started on  [place] with improvement in her breathing.  Jardiance stopped due to dysuria.  Referred to structural heart team with [facility] 03/2021 with patent left main and LAD stent, nonobstructive disease up to 40% in the mid LAD and left circumflex.  Filling pressures were normal and transmitral gradient normal therefore medical management recommended.   She was seen 05/13/2022 noting headache and isosorbide discontinued.  She was out of Praluent and it was refilled.    Cardiac MRI 10/2022 with non coronary distribution of LGE followed by cardiac PET showing FDG uptake consistent with active inflammation/sarcoid.  S[facility] Dr. Gasper [facility]/06[facility]23. She had been on prednisone for a week with plan to gradually decrease and start Cellcept. At visit with Heart Failure Pharmacist 06/10/23 Cellcept dose increased to 1,000mg  BID.   Presents today for follow up. Tells me she feels "fat" as her medications (Prednisone) have been making her more hungry. We discussed healthy, low carb snacks. She is eating out and at home. She does not add salt. She has not been weighing at home as she does not have a scale. She does note some occasional swelling. Over the weekend she she did take Furosemide. She was previously taking only when she was short of breath. More recently she is trying to take it once per week. She last took Lasix yesterday. Only sleeps 4-5 hours per night. Previously had sleep study and declined CPAP, never got dental device.   ROS: Please see the history of present illness.    All other systems  reviewed and are negative.   Studies Reviewed: .        Cardiac Studies & Procedures   CARDIAC CATHETERIZATION  CARDIAC CATHETERIZATION 04/21/2021  Narrative Conclusions: 1. Nonobstructive coronary artery disease with up to 40% stenosis involving the mid LAD and mid LCx.  Overall appearance is similar to prior catheterization in 2019. 2. Patent LMCA/LAD stent. 3. Normal left  and right heart filling pressures. 4. Mild pulmonary hypertension. 5. Normal to supranormal Fick cardiac output/index. 6. Normal transmitral gradient.  Recommendations: 1. Continue evidence-based heart failure therapy and ongoing management of mitral regurgitation per Dr. Duke Salvia and valve team. 2. Secondary prevention of coronary artery disease.  Yvonne Kendall, MD [facility]  Findings Coronary Findings Diagnostic  Dominance: Left  Left Main Vessel is large. Mid LM to Ost LAD lesion is 10% stenosed. The lesion is severely calcified. The lesion was previously treated using a drug eluting stent over 2 years ago.  Left Anterior Descending Prox LAD lesion is 40% stenosed. The lesion is mildly calcified.  First Diagonal Branch Vessel is moderate in size. Vessel is angiographically normal.  Second Diagonal Branch Vessel is large in size.  Ramus Intermedius Vessel is large. There is mild focal disease in the vessel. The vessel exhibits minimal luminal irregularities. The vessel is moderately tortuous. Ost Ramus lesion is 25% stenosed.  Left Circumflex Vessel is moderate in size. There is mild diffuse disease throughout the vessel. Prox Cx lesion is 40% stenosed.  First Left Posterolateral Branch Vessel is moderate in size. There is mild disease in the vessel.  Left Posterior Atrioventricular Artery Vessel is small in size.  Right Coronary Artery Vessel is small. Prox RCA to Mid RCA lesion is 25% stenosed.  Intervention  No interventions have been documented.   CARDIAC CATHETERIZATION  CARDIAC CATHETERIZATION 12/07/2017  Narrative Images from the original result were not included.   Mid LM to Ost LAD DES Stent is 10% stenosed. -This entire segment is heavily calcified. There is a significant step down after the stent at the takeoff of the diagonal branches.  Prox LAD lesion is 20% stenosed.  Ost Ramus lesion is 40% stenosed. Proc Circumflex ~40%.  There  is moderate left ventricular systolic dysfunction. The left ventricular ejection fraction is 35-45% by visual estimate.  LV end diastolic pressure is moderately elevated. 22-24 mmHg  There does not appear to be an obvious culprit lesion to explain inferior ischemia.  She has a large wraparound LAD and is quite likely with LAD infarct she had inferoapical hypokinesis.  There is moderate disease in the LAD, but not flow-limiting.  Visually, her EF does appear to be slightly higher than what was read on the nuclear stress test.  Plan: Discharge home after bed rest Continue medical management.  She could have microvascular ischemia with elevated filling pressures.    She will follow-up with Dr. Duke Salvia as scheduled.   Bryan Lemma, M.D., M.S. Interventional Cardiologist  Pager # [phone] Phone # [phone] [address] [place], [place] [address]  Findings Coronary Findings Diagnostic  Dominance: Left  Left Main Vessel is large. Mid LM to Ost LAD lesion is 10% stenosed. The lesion is severely calcified. The lesion was previously treated using a drug eluting stent over 2 years ago.  Left Anterior Descending Prox LAD lesion is 20% stenosed. The lesion is mildly calcified.  First Diagonal Branch Vessel is moderate in size. Vessel is angiographically normal.  Second Diagonal Branch Vessel is large in size.  Ramus Intermedius Vessel  is large. There is mild focal disease in the vessel. The vessel exhibits minimal luminal irregularities. The vessel is moderately tortuous. Ost Ramus lesion is 40% stenosed.  Left Circumflex Vessel is moderate in size. There is mild diffuse disease throughout the vessel. Prox Cx lesion is 40% stenosed.  First Left Posterolateral Branch Vessel is moderate in size. There is mild disease in the vessel.  Left Posterior Atrioventricular Artery Vessel is small in size.  Right Coronary Artery Vessel is small.  Intervention  No  interventions have been documented.   STRESS TESTS  MYOCARDIAL PERFUSION IMAGING 01/17/2021  Narrative  The left ventricular ejection fraction is moderately decreased (30-44%).  Nuclear stress EF: 39%.  Blood pressure demonstrated a hypertensive response to exercise.  Downsloping ST segment depression of 0.5 mm was noted in the II and aVF leads (ST deviation beginning in recovery).  Defect 1: There is a medium defect of moderate severity present in the apical anterior and apex location.  Defect 2: There is a medium defect of severe severity present in the mid inferior and apical inferior location.  Findings consistent with prior myocardial infarction with peri-infarct ischemia.  This is an intermediate risk study.  Intermediate risk stress nuclear study with evidence of previous infarction in the distal LAD artery and posterior descending artery distribution, with very limited peri-infarct ischemia. Moderately reduced left ventricular global systolic function. No significant change since 2018.   ECHOCARDIOGRAM  ECHOCARDIOGRAM COMPLETE 05/07/2022  Narrative ECHOCARDIOGRAM REPORT    Patient Name:   CRYSTEN KAMAN Date of Exam: 05/07/2022 Medical Rec #:  [id number]       Height:       62.5 in Accession #:    [id number]      Weight:       194.0 lb Date of Birth:  06/06/1956        BSA:          1.898 m Patient Age:    65 years        BP:           155/80 mmHg Patient Gender: F               HR:           79 bpm. Exam Location:  Outpatient  Procedure: 2D Echo, Cardiac Doppler, Color Doppler and Intracardiac Opacification Agent  Indications:    I34.0 (ICD-10-CM) - Nonrheumatic mitral valve regurgitation  History:        Patient has prior history of Echocardiogram examinations, most recent 03/04/2021. Cardiomyopathy and CHF, Previous Myocardial Infarction and CAD, Signs/Symptoms:Edema and Dyspnea; Risk Factors:Hypertension, Dyslipidemia, Sleep Apnea, Diabetes and Non-Smoker.  Patient denies chest pain. She does have DOE with intermittent leg edema.  Sonographer:    Carlos American RVT, RDCS (AE), RDMS Referring Phys: [phone] [facility]   Sonographer Comments: Suboptimal apical window and patient is morbidly obese. IMPRESSIONS   1. There is highly eccentric, at least moderate mitral regurgitation. Difficult to quantify due to coanda effect. E wave is <1.0 and no pulmonary vein reversal noted arguing against severe mitral regurgitation. Recommend CMR or TEE for further evaluation. The mitral valve is degenerative. Severe mitral annular calcification. 2. Left ventricular ejection fraction, by estimation, is 35-40%. The left ventricle has moderately decreased function. The left ventricle demonstrates regional wall motion abnormalities (see scoring diagram/findings for description). The mid-to-apical inferoseptal segments and apex are akinetic. The mid-to-apical anteroseptal segments and mid-to-apical inferior segments are severely hypokinetic. The rest of the LV segments are  mildly hypokinetic. There is moderate left ventricular hypertrophy of the basal-septal segment. Diastolic function indeterminant due to severe MAC. 3. Right ventricular systolic function is normal. The right ventricular size is normal. 4. Left atrial size was moderately dilated. 5. The aortic valve is tricuspid. There is mild calcification of the aortic valve. There is mild thickening of the aortic valve. Aortic valve regurgitation is mild. 6. The inferior vena cava is normal in size with greater than 50% respiratory variability, suggesting right atrial pressure of 3 mmHg.  Comparison(s): Prior TTE in 02/2021 with EF 35%, mod-sev MR, global hypokinesis, apex is akinetic. inferior septum is hypokinetic, mid LV hypertrophy. Overall no significant change.  FINDINGS Left Ventricle: Left ventricular ejection fraction, by estimation, is 35 to 40%. The left ventricle has moderately decreased function.  The left ventricle demonstrates regional wall motion abnormalities. The mid-to-apical inferoseptal segments and apex are akinetic. The mid-to-apical anteroseptal segments and mid-to-apical inferior segments are severely hypokinetic. The rest of the LV segments are mildly hypokinetic. Definity contrast agent was given IV to delineate the left ventricular endocardial borders. 3D left ventricular ejection fraction analysis performed but not reported based on interpreter judgement due to suboptimal tracking. The left ventricular internal cavity size was normal in size. There is moderate left ventricular hypertrophy of the basal-septal segment. Diastolic function indeterminant due to severe MAC.  Right Ventricle: The right ventricular size is normal. No increase in right ventricular wall thickness. Right ventricular systolic function is normal.  Left Atrium: Left atrial size was moderately dilated.  Right Atrium: Right atrial size was normal in size.  Pericardium: There is no evidence of pericardial effusion.  Mitral Valve: There is highly eccentric, at least moderate mitral regurgitation. Difficult to quantify due to coanda effect. E wave is <1.0 and no pulmonary vein reversal noted arguing against severe mitral regurgitation. Recommend CMR or TEE for further evaluation. The mitral valve is degenerative in appearance. There is moderate thickening of the mitral valve leaflet(s). There is moderate calcification of the mitral valve leaflet(s). Severe mitral annular calcification. MV peak gradient, 6.5 mmHg. The mean mitral valve gradient is 2.0 mmHg.  Tricuspid Valve: The tricuspid valve is normal in structure. Tricuspid valve regurgitation is trivial.  Aortic Valve: The aortic valve is tricuspid. There is mild calcification of the aortic valve. There is mild thickening of the aortic valve. Aortic valve regurgitation is mild. Aortic valve mean gradient measures 3.0 mmHg. Aortic valve peak  gradient measures 5.1 mmHg. Aortic valve area, by VTI measures 1.84 cm.  Pulmonic Valve: The pulmonic valve was normal in structure. Pulmonic valve regurgitation is trivial.  Aorta: The aortic root and ascending aorta are structurally normal, with no evidence of dilitation.  Venous: The inferior vena cava is normal in size with greater than 50% respiratory variability, suggesting right atrial pressure of 3 mmHg.  IAS/Shunts: The atrial septum is grossly normal.   LEFT VENTRICLE PLAX 2D LVIDd:         5.08 cm      Diastology LVIDs:         4.03 cm      LV e' medial:    5.87 cm/s LV PW:         1.17 cm      LV E/e' medial:  15.5 LV IVS:        0.82 cm      LV e' lateral:   6.48 cm/s LVOT diam:     1.80 cm      LV E/e'  lateral: 14.1 LV SV:         47 LV SV Index:   25 LVOT Area:     2.54 cm  3D Volume EF: LV Volumes (MOD)            3D EF:        50 % LV vol d, MOD A2C: 131.0 ml LV EDV:       141 ml LV vol d, MOD A4C: 138.0 ml LV ESV:       71 ml LV vol s, MOD A2C: 68.3 ml  LV SV:        70 ml LV vol s, MOD A4C: 85.0 ml LV SV MOD A2C:     62.7 ml LV SV MOD A4C:     138.0 ml LV SV MOD BP:      63.5 ml  RIGHT VENTRICLE RV S prime:     11.50 cm/s TAPSE (M-mode): 1.7 cm  LEFT ATRIUM           Index        RIGHT ATRIUM           Index LA diam:      3.90 cm 2.05 cm/m   RA Area:     12.30 cm LA Vol (A4C): 77.1 ml 40.61 ml/m  RA Volume:   26.80 ml  14.12 ml/m AORTIC VALVE                    PULMONIC VALVE AV Area (Vmax):    1.98 cm     PV Vmax:          0.65 m/s AV Area (Vmean):   1.91 cm     PV Peak grad:     1.7 mmHg AV Area (VTI):     1.84 cm     PR End Diast Vel: 3.71 msec AV Vmax:           113.00 cm/s AV Vmean:          84.200 cm/s AV VTI:            0.254 m AV Peak Grad:      5.1 mmHg AV Mean Grad:      3.0 mmHg LVOT Vmax:         87.80 cm/s LVOT Vmean:        63.100 cm/s LVOT VTI:          0.184 m LVOT/AV VTI ratio: 0.72  AORTA Ao Root diam: 2.95 cm Ao  Asc diam:  3.10 cm Ao Arch diam: 2.8 cm  MITRAL VALVE                TRICUSPID VALVE MV Area (PHT): 4.80 cm     TR Peak grad:   16.3 mmHg MV Area VTI:   1.85 cm     TR Vmax:        202.00 cm/s MV Peak grad:  6.5 mmHg MV Mean grad:  2.0 mmHg     SHUNTS MV Vmax:       1.27 m/s     Systemic VTI:  0.18 m MV Vmean:      71.4 cm/s    Systemic Diam: 1.80 cm MV Decel Time: 158 msec MR Peak grad: 104.4 mmHg MR Mean grad: 72.0 mmHg MR Vmax:      511.00 cm/s MR Vmean:     402.0 cm/s MV E velocity: 91.20 cm/s MV A velocity: 125.00 cm/s MV E/A ratio:  0.73  Laurance Flatten MD Electronically signed by Laurance Flatten MD Signature Date/Time: 05/07/2022/5:34:48 PM    Final   TEE  ECHO TEE 03/13/2021  Narrative TRANSESOPHOGEAL ECHO REPORT    Patient Name:   Stevphen Meuse Date of Exam: 03/13/2021 Medical Rec #:  [id number]       Height:       62.0 in Accession #:    [id number]      Weight:       189.0 lb Date of Birth:  [birth year]        BSA:          1.866 m Patient Age:    64 years        BP:           131/62 mmHg Patient Gender: F               HR:           89 bpm. Exam Location:  Inpatient  Procedure: Transesophageal Echo, 3D Echo, Color Doppler, Cardiac Doppler and Saline Contrast Bubble Study  MODIFIED REPORT: This report was modified by Weston Brass MD on 03/15/2021 due to revision. Indications:     Mitral Valve Regurgitation  History:         Patient has prior history of Echocardiogram examinations, most recent 03/04/2021. CHF, Previous Myocardial Infarction and CAD; Risk Factors:Diabetes and Hypertension.  Sonographer:     Eulah Pont RDCS Referring Phys:  [id number] Parke Poisson Diagnosing Phys: Weston Brass MD  PROCEDURE: After discussion of the risks and benefits of a TEE, an informed consent was obtained from the patient. TEE procedure time was 33 minutes. The transesophogeal probe was passed without difficulty through the esophogus of the patient.  Imaged were obtained with the patient in a left lateral decubitus position. Local oropharyngeal anesthetic was provided with Cetacaine. Sedation performed by different physician. The patient was monitored while under deep sedation. Anesthestetic sedation was provided intravenously by Anesthesiology: 557.06mg  of Propofol, 80mg  of Lidocaine. Image quality was good. The patient's vital signs; including heart rate, blood pressure, and oxygen saturation; remained stable throughout the procedure. The patient developed no complications during the procedure.  IMPRESSIONS   1. Very eccentric, anteriorly directed jet of mitral valve regurgitation with small flail segment at P3 scallop. No systolic reversals in pulmonary veins. Mitral valve regurgitation appears moderate-severe. PISA quantitation with angle correction results in ERO 0.17 cm2 with regurgitant volume 30 mL. Patient's systolic blood pressure was approximately 90 mmHg at time of MV regurgitation evaluation, suspect degree of regurgitation may appear worse at ambulatory blood pressures given flail segment. The mitral valve is abnormal. Moderate to severe mitral valve regurgitation. No evidence of mitral stenosis. 2. Left ventricular ejection fraction, by estimation, is 45 to 50%. The left ventricle has mildly decreased function. 3. Right ventricular systolic function is normal. The right ventricular size is normal. 4. Left atrial size was moderately dilated. No left atrial/left atrial appendage thrombus was detected. The LAA emptying velocity was 68 cm/s. 5. Right atrial size was moderately dilated. 6. A small pericardial effusion is present. 7. The aortic valve is normal in structure. Aortic valve regurgitation is not visualized. No aortic stenosis is present. 8. There is Moderate (Grade III) atheroma plaque involving the transverse and descending aorta. 9. Agitated saline contrast bubble study was positive with shunting observed after >6 cardiac  cycles suggestive of intrapulmonary shunting.  FINDINGS Left Ventricle: Left ventricular ejection fraction, by estimation, is 45 to 50%. The left ventricle has  mildly decreased function. The left ventricular internal cavity size was normal in size.  Right Ventricle: The right ventricular size is normal. No increase in right ventricular wall thickness. Right ventricular systolic function is normal.  Left Atrium: Left atrial size was moderately dilated. No left atrial/left atrial appendage thrombus was detected. The LAA emptying velocity was 68 cm/s.  Right Atrium: Right atrial size was moderately dilated.  Pericardium: A small pericardial effusion is present.  Mitral Valve: Very eccentric, anteriorly directed jet of mitral valve regurgitation with small flail segment at P3 scallop. No systolic reversals in pulmonary veins. Mitral valve regurgitation appears moderate-severe. PISA quantitation with angle correction results in ERO 0.17 cm2 with regurgitant volume 30 mL. Patient's systolic blood pressure was approximately 90 mmHg at time of MV regurgitation evaluation, suspect degree of regurgitation may appear worse at ambulatory blood pressures given flail segment. The mitral valve is abnormal. Moderate to severe mitral valve regurgitation. No evidence of mitral valve stenosis.  Tricuspid Valve: The tricuspid valve is normal in structure. Tricuspid valve regurgitation is trivial. No evidence of tricuspid stenosis.  Aortic Valve: The aortic valve is normal in structure. Aortic valve regurgitation is not visualized. No aortic stenosis is present.  Pulmonic Valve: The pulmonic valve was normal in structure. Pulmonic valve regurgitation is trivial. No evidence of pulmonic stenosis.  Aorta: The aortic root and ascending aorta are structurally normal, with no evidence of dilitation. There is moderate (Grade III) atheroma plaque involving the transverse and descending aorta.  IAS/Shunts: No atrial  level shunt detected by color flow Doppler. Agitated saline contrast was given intravenously to evaluate for intracardiac shunting. Agitated saline contrast bubble study was positive with shunting observed after >6 cardiac cycles suggestive of intrapulmonary shunting.    AORTA Ao Root diam: 2.90 cm Ao Asc diam:  2.90 cm  MR Peak grad:    121.0 mmHg MR Mean grad:    70.0 mmHg MR Vmax:         550.00 cm/s MR Vmean:        382.0 cm/s MR PISA:         5.09 cm MR PISA Eff ROA: 36 mm MR PISA Radius:  0.90 cm  Weston Brass MD Electronically signed by Weston Brass MD Signature Date/Time: 03/15/2021/4:20:06 PM    Final (Updated)   MONITORS  LONG TERM MONITOR (3-14 DAYS) 04/06/2023  Narrative 7 Day Zio Monitor  Quality: Fair.  Baseline artifact. Predominant rhythm: sinus rhythm Average heart rate: 81 bpm Max heart rate: 131 bpm Min heart rate: 44 bpm Pauses >2.5 seconds: none  Rare (<1%) PACs and PVCs 2 episodes of SVT up to 6 beats.  Max rate 194 bpm.  Tiffany C. Duke Salvia, MD, [facility] 05/03/2023 6:46 PM    CARDIAC MRI  MR CARDIAC MORPHOLOGY W WO CONTRAST 11/03/2022  Narrative CLINICAL DATA:  70F with CAD s/p STEMI in 2015 (DES to LM/LAD and LCX), chronic combined heart failure, breast cancer, OSA, diabetes. Echo with EF 35-40%, very eccentric MR jet  EXAM: CARDIAC MRI  TECHNIQUE: The patient was scanned on a 1.5 Tesla Siemens magnet. A dedicated cardiac coil was used. Functional imaging was done using Fiesta sequences. 2,3, and 4 chamber views were done to assess for RWMA's. Modified Simpson's rule using a short axis stack was used to calculate an ejection fraction on a dedicated work [employer]. The patient received 10 cc of Gadavist. After 10 minutes inversion recovery sequences were used to assess for infiltration and scar tissue. Phase  contrast velocity mapping was performed above the aortic and pulmonic valves  CONTRAST:  10 cc  of  Gadavist  FINDINGS: Left ventricle:  -Mild hypertrophy  -Normal size  -Moderate systolic dsyfunction. Akinesis of basal anteroseptal, mid to apical inferior/inferoseptal, and apical anterior walls  -Nonspecific ECV elevation (32%).  Normal T2 values  -Subendocardial LGE in apical anterior/inferior, mid inferior, and basal anteroseptal walls. Mid inferoseptal midwall LGE  LV EF:  35% (Normal 52-79%)  Absolute volumes:  LV EDV: (Normal 78-167 mL)  LV ESV: (Normal 21-64 mL)  LV SV: 64mL (Normal 52-114 mL)  CO: 5.3L/min (Normal 2.7-6.3 L/min)  Indexed volumes:  LV EDV: 86mL/sq-m (Normal 50-96 mL/sq-m)  LV ESV:61 mL/sq-m (Normal 10-40 mL/sq-m)  LV SV: 2mL/sq-m (Normal 33-64 mL/sq-m)  CI: 2.7L/min/sq-m (Normal 1.9-3.9 L/min/sq-m)  Right ventricle: Normal size and systolic function  RV EF: 64% (Normal 52-80%)  Absolute volumes:  RV EDV: 89mL (Normal 79-175 mL)  RV ESV: 32mL (Normal 13-75 mL)  RV SV: 57mL (Normal 56-110 mL)  CO: 4.7L/min (Normal 2.7-6 L/min)  Indexed volumes:  RV EDV: 57mL/sq-m (Normal 51-97 mL/sq-m)  RV ESV: 34mL/sq-m (Normal 9-42 mL/sq-m)  RV SV: 64mL/sq-m (Normal 35-61 mL/sq-m)  CI: 2.4L/min/sq-m (Normal 1.8-3.8 L/min/sq-m)  Left atrium: Mild enlargement  Right atrium: Normal size  Mitral valve: Mild regurgitation (regurgitant fraction 16%)  Aortic valve: Tricuspid. Trivial regurgitation  Tricuspid valve: Mild regurgitation  Pulmonic valve: No regurgitation  Aorta: Normal proximal ascending aorta  Pericardium: Normal  IMPRESSION: 1. There is subendocardial LGE in apical anterior and mid to apical inferior walls, which could be consistent with LAD territory infarct as patient has known large wrap-around LAD, but the apex is spared, which argues against this. In addition, there is focal area of subendocardial LGE suggesting infarct in the basal anteroseptum, which is not in a coronary distribution. There is  also mid inferoseptal midwall LGE, which is a nonischemic scar pattern. 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 Cardiac sarcoid is on the differential, as can cause both subendocardial and midwall LGE. Recommend cardiac PET to evaluate for sarcoid.  2. Mild LV hypertrophy, normal size, and moderate systolic dysfunction (EF 35%)  3.  Normal RV size and systolic function (EF 64%)  4.  Mild mitral regurgitation (regurgitant fraction 16%)   Electronically Signed By: Epifanio Lesches M.D. On: 11/03/2022 23:00         Risk Assessment/Calculations:             Physical Exam:   VS:  BP 134/64   Pulse 93   Ht 5\' 2"  (1.575 m)   Wt 205 lb (93 kg)   BMI 37.49 kg/m    Wt Readings from Last 3 Encounters:  06/21/23 205 lb (93 kg)  06/10/23 198 lb 6.4 oz (90 kg)  05/06/23 197 lb 3.2 oz (89.4 kg)    GEN: Well nourished, well developed in no acute distress NECK: No JVD; No carotid bruits CARDIAC: RRR, no murmurs, rubs, gallops RESPIRATORY:  Clear to auscultation without rales, wheezing or rhonchi  ABDOMEN: Soft, non-tender, non-distended EXTREMITIES:  No edema; No deformity   ASSESSMENT AND PLAN: .    CAD / HLD, LDL goal <70- Stable with no anginal symptoms. No indication for ischemic evaluation.  GDMT Brilinta, Metoprolol, Praluent. Praluent refill provided.   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 On Cellcept, Prednisone per Advanced Heart Failure team. Low sodium diet, fluid restriction <  2L, and daily weights encouraged. Educated to contact our office for weight gain of 2 lbs overnight or 5 lbs in one week. Did not tolerate Jardiance due to vaginal irritation. Slightly volume up today, she will take her PRN Lasix daily for the next 2 days then return to PRN. GDMT includes Lasix, Metoprolol, Entresto, Spironolactone.    OSA - previously diagnosed with OSA 2018 declined CPAP at  the time and did not follow up regarding dental device. Notes snoring, daytime somnolence. Discussed advances in CPAP therapy, Inspire device. She is interested in treatment of her OSA. Plan for split night sleep study to reassess.    Moderate to severe MR - Evaluated by structural heart team and medical management recommended. Management of HF symptoms as above.         Dispo: As scheduled with HF clinic and in 6 months with Dr. Duke Salvia  Signed, Alver Sorrow, NP

## 2023-06-21 NOTE — Patient Instructions (Signed)
Medication Instructions:  Your physician has recommended you make the following change in your medication:   Recommend taking your Furosemide (Lasix) daily for the next two days   *If you need a refill on your cardiac medications before your next appointment, please call your pharmacy*  Testing/Procedures: Your provider recommends an in lab sleep study.   Follow-Up: At [facility], you and your health needs are our priority.  As part of our continuing mission to provide you with exceptional heart care, we have created designated Provider Care Teams.  These Care Teams include your primary Cardiologist (physician) and Advanced Practice Providers (APPs -  Physician Assistants and Nurse Practitioners) who all work together to provide you with the care you need, when you need it.  We recommend signing up for the patient portal called "MyChart".  Sign up information is provided on this After Visit Summary.  MyChart is used to connect with patients for Virtual Visits (Telemedicine).  Patients are able to view lab/test results, encounter notes, upcoming appointments, etc.  Non-urgent messages can be sent to your provider as well.   To learn more about what you can do with MyChart, go to [email or web address].    Your next appointment:   As scheduled with the Advanced Heart Failure Clinic AND In 6 months with Dr. Duke Salvia or Alver Sorrow, NP   Other Instructions

## 2023-06-22 ENCOUNTER — Other Ambulatory Visit (HOSPITAL_COMMUNITY): Payer: Self-pay

## 2023-06-22 MED ORDER — TRULICITY 3 MG/0.5ML ~~LOC~~ SOAJ
3.0000 mg | SUBCUTANEOUS | 0 refills | Status: DC
  Filled 2023-06-22: qty 2, 28d supply, fill #0

## 2023-06-23 ENCOUNTER — Other Ambulatory Visit (HOSPITAL_COMMUNITY): Payer: Self-pay

## 2023-06-23 NOTE — Telephone Encounter (Signed)
ERROR

## 2023-06-24 ENCOUNTER — Other Ambulatory Visit (HOSPITAL_COMMUNITY): Payer: Self-pay

## 2023-06-24 ENCOUNTER — Encounter (HOSPITAL_BASED_OUTPATIENT_CLINIC_OR_DEPARTMENT_OTHER): Payer: Self-pay | Admitting: Pharmacist Clinician (PhC)/ Clinical Pharmacy Specialist

## 2023-06-25 ENCOUNTER — Telehealth: Payer: Self-pay

## 2023-06-25 ENCOUNTER — Other Ambulatory Visit (HOSPITAL_COMMUNITY): Payer: Self-pay

## 2023-06-25 ENCOUNTER — Telehealth: Payer: Self-pay | Admitting: Pharmacist Clinician (PhC)/ Clinical Pharmacy Specialist

## 2023-06-25 ENCOUNTER — Encounter (HOSPITAL_BASED_OUTPATIENT_CLINIC_OR_DEPARTMENT_OTHER): Payer: Self-pay | Admitting: Pharmacist Clinician (PhC)/ Clinical Pharmacy Specialist

## 2023-06-25 ENCOUNTER — Encounter (HOSPITAL_BASED_OUTPATIENT_CLINIC_OR_DEPARTMENT_OTHER): Payer: Self-pay | Admitting: Family

## 2023-06-25 NOTE — Telephone Encounter (Signed)
Please do updated PA for Trulicity.  Patient has new insurance.  (See MyChart message for copies of card)  [employer] [place] ID  [id number] State ID [id number] M

## 2023-06-25 NOTE — Telephone Encounter (Signed)
   Per test claim: Marie Tanner is still primary coverage not [employer]/Medicaid. If the pt believes this is a mistake then she will need to call Medicaid to inform them that she doesn't have another coverage

## 2023-06-29 ENCOUNTER — Other Ambulatory Visit (HOSPITAL_COMMUNITY): Payer: Self-pay

## 2023-06-29 NOTE — Telephone Encounter (Signed)
I just checked with someone else to confirm that PA is not needed on [employer]. Pharmacy needs back out the claim and run both plans. I'm touching base with our pharmacy folks now to back out and run again with the secondary attached.

## 2023-06-29 NOTE — Telephone Encounter (Signed)
Please do updated PA for Trulicity.  New insurance card should be in system (also attached photo in MyChart message)

## 2023-06-29 NOTE — Telephone Encounter (Signed)
Per test claim the pharmacy has her Trulicity filled. She has Community education officer as her Scientist, physiological. [employer] is secondary. They will not pick up cost on something her primary insurer Administrator) is covering already.

## 2023-07-07 NOTE — Progress Notes (Signed)
ADVANCED HEART FAILURE CLINIC NOTE  Referring Physician: Quita Skye, PA-C  Primary Care: Quita Skye, PA-C Primary Cardiologist: Dr. Gasper Lloyd  HPI: Marie Tanner is a 67 y.o. female with coronary artery disease status post STEMI, hypertension, chronic systolic heart failure, obstructive sleep apnea not on CPAP, type 2 diabetes, history of DVT, breast cancer that presents today to establish care.  It appears that Marie Tanner's cardiac history dates back to 2015 when she had a STEMI complicated by cardiac arrest at [facility].  She received 2 stents to the left main/LAD and circumflex.  During this time she had a prolonged hospitalization requiring tracheostomy; liver laceration during CPR which required surgical repair and also developed a DVT during this time.  She underwent repeat left heart cath in January 2019 that demonstrated patent stents which were heavily calcified with other areas of nonobstructive CAD.  EF at that time was 35 to 45%.  With increasing GDMT she had improvement in EF by late 2019 to 40 to 50%.  Due to moderate to severe eccentric mitral regurgitation she had a TEE in 2022 and repeat right and left heart cath with structural evaluation in May 2022 that showed patent left main and LAD stents normal filling pressures and a normal transmitral gradient.  She was deferred for medical management only.  At some point she had a cardiac MRI with noncoronary distribution of LGE followed by a cardiac PET that showed FDG uptake consistent with active inflammation/sarcoid.  Interval hx:  Reports feeling very well from a functional standpoint; reports feeling a "little tired". She is now working on her food truck on Fridays only. No chest pain, SOB, LE edema or lightheadedness. Started prednisone 1 week ago. Has not started cellcept yet; awaiting delivery in the mail.   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   GERD (gastroesophageal reflux disease)    HCAP (healthcare-associated pneumonia) 11/22/2014   Heart murmur    High cholesterol    History of blood transfusion 09/2014   related to MI   History of gout    Hypertension    Liver laceration 10/19/2014   Palpitations 12/12/2020   Pulmonary edema 10/19/2014   Renal disorder    Renal disorder    Right hip pain 04/03/2021   Sleep apnea    "suppose to wear mask but I don't" (11/22/2014)   STEMI (ST elevation myocardial infarction) (HCC) 10/19/2014   Type II diabetes mellitus (HCC)    Urticaria     Current Outpatient Medications  Medication Sig Dispense Refill   acetaminophen (TYLENOL) 500 MG tablet Take 1,000 mg by mouth every 6 (six) hours as needed for moderate pain.     aspirin EC 81 MG tablet Take 1 tablet (81 mg total) by mouth daily.     blood glucose meter kit and supplies KIT by Does not apply route daily as needed. Dispense based on patient and insurance preference. Use up to four times daily as directed. (FOR ICD-9 250.00, 250.01).  calcium-vitamin D (OSCAL WITH D) 500-5 MG-MCG tablet Take 1 tablet by mouth 2 (two) times daily.     cholecalciferol (VITAMIN D3) 25 MCG (1000 UNIT) tablet Take 1,000 Units by mouth daily.     Dulaglutide (TRULICITY) 3 MG/0.5ML SOPN Inject 3 mg into the skin once a week. 2 mL 1   Dulaglutide (TRULICITY) 3 MG/0.5ML SOPN Inject 3 mg into the skin once a week. 6 mL 0   Evolocumab (REPATHA SURECLICK) 140 MG/ML SOAJ Inject 140 mg into the skin every 14  (fourteen) days. 2 mL 5   furosemide (LASIX) 40 MG tablet TAKE 1 TABLET ONCE PER WEEK WITH ADDITIONAL TABLET AS NEEDED FOR SHORTNESS OF BREATH OR WEIGHT GAIN OF 2 POUNDS IN 24 HOURS OR 5 POUNDS IN 1 WEEK 30 tablet 3   glipiZIDE (GLUCOTROL XL) 2.5 MG 24 hr tablet Take 1.25 mg by mouth daily with breakfast.     glucose blood test strip Use as instructed. E11.5. To check blood glucose daily. 100 each 12   ketoconazole (NIZORAL) 2 % cream Apply to both feet and between toes once daily for 6 weeks. 60 g 1   magnesium oxide (MAG-OX) 400 MG tablet TAKE 1 TABLET BY MOUTH EVERY DAY 90 tablet 0   metoprolol succinate (TOPROL-XL) 25 MG 24 hr tablet TAKE 1 TABLET (25 MG TOTAL) BY MOUTH DAILY. 90 tablet 3   mycophenolate (CELLCEPT) 500 MG tablet Take 2 tablets (1,000 mg total) by mouth 2 (two) times daily. 120 tablet 5   nitroGLYCERIN (NITROSTAT) 0.4 MG SL tablet Place 1 tablet (0.4 mg total) under the tongue every 5 (five) minutes as needed for chest pain. max of 3 if no relief call 911 25 tablet PRN   pantoprazole (PROTONIX) 40 MG tablet Take 1 tablet (40 mg total) by mouth daily. 30 tablet 5   predniSONE (DELTASONE) 10 MG tablet Take 1.5 tablets (15 mg total) by mouth daily with breakfast. 60 tablet 5   rosuvastatin (CRESTOR) 20 MG tablet TAKE 1 TABLET BY MOUTH 3 TIMES PER WEEK 40 tablet 5   sacubitril-valsartan (ENTRESTO) 97-103 MG Take 1 tablet by mouth 2 (two) times daily. 180 tablet 3   spironolactone (ALDACTONE) 25 MG tablet Take 1 tablet (25 mg total) by mouth daily. 30 tablet 11   sulfamethoxazole-trimethoprim (BACTRIM DS) 800-160 MG tablet Take 1 tablet by mouth every Monday, Wednesday, and Friday. 12 tablet 5   ticagrelor (BRILINTA) 60 MG TABS tablet Take 1 tablet (60 mg total) by mouth 2 (two) times daily. 180 tablet 3   No current facility-administered medications for this visit.    Allergies  Allergen Reactions   Dilaudid [Hydromorphone Hcl] Anaphylaxis, Itching and Swelling   Epinephrine  Anxiety and Other (See Comments)    Severe anxiety   Lactose Intolerance (Gi) Diarrhea   Jardiance [Empagliflozin]     YEAST INFECTION       Social History   Socioeconomic History   Marital status: Divorced    Spouse name: Not on file   Number of children: 2   Years of education: Not on file   Highest education level: Not on file  Occupational History   Not on file  Tobacco Use   Smoking status: Never   Smokeless tobacco: Never  Vaping Use   Vaping status: Never Used  Substance and Sexual Activity   Alcohol use: Yes    Comment: occ   Drug use: No   Sexual activity: Not Currently  Other Topics Concern  Not on file  Social History Narrative   Not on file   Social Determinants of Health   Financial Resource Strain: Not on File (03/19/2022)   Received from [employer] [facility], [place]   [employer]    Financial Resource Strain: 0  Food Insecurity: Declined (04/06/2023)   Received from [facility], [place]   Food Insecurity    Food: 99  Transportation Needs: Not at Risk (04/06/2023)   Received from [place], [employer] Needs    Transportation: 1  Physical Activity: Not on File (03/19/2022)   Received from [place], [place]   Physical Activity    Physical Activity: 0  Stress: Not on File (03/19/2022)   Received from [facility], [place]   Stress    Stress: 0  Social Connections: Not on File (03/19/2022)   Received from [place], [place]   Social Connections    Social Connections and Isolation: 0  Intimate Partner Violence: Not on file      Family History  Problem Relation Age of Onset   Diabetes Mother    Hypertension Mother    COPD Mother    Heart disease Father    Heart attack Father    Alzheimer's disease Father    Diabetes Brother    Asthma Daughter    Stroke Neg Hx    Colon cancer Neg Hx    Stomach cancer Neg Hx    Pancreatic cancer Neg Hx    AAA (abdominal aortic aneurysm) Neg Hx     PHYSICAL EXAM: There were no vitals filed for this visit. GENERAL: Well  nourished, well developed, and in no apparent distress at rest.  HEENT: Negative for arcus senilis or xanthelasma. There is no scleral icterus.  The mucous membranes are pink and moist.   NECK: Supple, No masses. Normal carotid upstrokes without bruits. No masses or thyromegaly.    CHEST: There are no chest wall deformities. There is no chest wall tenderness. Respirations are unlabored.  Lungs- *** CARDIAC:  JVP: *** cm          Normal rate with regular rhythm. No murmurs, rubs or gallops.  Pulses are 2+ and symmetrical in upper and lower extremities. *** edema.  ABDOMEN: Soft, non-tender, non-distended. There are no masses or hepatomegaly. There are normal bowel sounds.  EXTREMITIES: Warm and well perfused with no cyanosis, clubbing.  LYMPHATIC: No axillary or supraclavicular lymphadenopathy.  NEUROLOGIC: Patient is oriented x3 with no focal or lateralizing neurologic deficits.  PSYCH: Patients affect is appropriate, there is no evidence of anxiety or depression.  SKIN: Warm and dry; no lesions or wounds.     DATA REVIEW  ECG: 03/26/23: NSR  As per my personal interpretation  ECHO: 05/07/22: LVEF 35%-40%, eccentric moderate - severe MR, normal RV function As per my personal interpretation  CATH: 04/21/21:   Nonobstructive coronary artery disease with up to 40% stenosis involving the mid LAD and mid LCx.  Overall appearance is similar to prior catheterization in 2019. Patent LMCA/LAD stent. Normal left and right heart filling pressures. Mild pulmonary hypertension. Normal to supranormal Fick cardiac output/index. Normal transmitral gradient.  CMR:  11/03/22:  1. There is subendocardial LGE in apical anterior and mid to apical inferior walls, which could be consistent with LAD territory infarct as patient has known large wrap-around LAD, but the apex is spared, which argues against this. In addition, there is focal area of subendocardial LGE suggesting infarct in the basal  anteroseptum, which is not in a coronary distribution. There is also  mid inferoseptal midwall LGE, which is a nonischemic scar pattern. 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 Cardiac sarcoid is on the differential, as can cause both subendocardial and midwall LGE. Recommend cardiac PET to evaluate for sarcoid.  2. Mild LV hypertrophy, normal size, and moderate systolic dysfunction (EF 35%)  3.  Normal RV size and systolic function (EF 64%)  4.  Mild mitral regurgitation (regurgitant fraction 16%)  PET (02/09/23)  FDG uptake was observed. FDG uptake was diffuse. FDG uptake was present in the apical to mid anterior, septal and apex segment(s). LV perfusion is abnormal. Defect 1: There is a large defect with severe reduction in uptake present in the apical anterior, inferior and apex location(s). There is abnormal wall motion in the defect area. Consistent with infarction. Defect 2: There is a small defect with mild reduction in uptake present in the mid septal location(s). There is abnormal wall motion in the defect area. Consistent with infarction.   Left ventricular function is abnormal. There were multiple regional abnormalities. EF: 37 %. Akinesis of the apical septum, apical inferior segment, and apex. Akinesis of the basal to mid septum. End diastolic cavity size is mildly enlarged. End systolic cavity size is mildly enlarged.   Coronary calcium assessment not performed due to prior revascularization.   FDG uptake findings are consistent with active inflammation/sarcoidosis.  ASSESSMENT & PLAN:  Heart failure with reduced EF Etiology of YN:WGNFAOZH cardiomyopathy; hx of STEMI with PCI to the LAD, Lcx and LM; CMR with noncoronary distribution of LGE with active uptake via Cardiac PET.  NYHA class / AHA Stage:NYHAIIB Volume status & Diuretics: Euvolemic, lasix 40mg  PRN only Vasodilators:Entresto 97/103mg  BID Beta-Blocker:metoprolol  25mg  daily YQM:[id number] 50mg  Cardiometabolic:SGLT2i led to yeast infection. Will continue to hold as she is now on immunosuppressives. Devices therapies & Valvulopathies:not indicated Advanced therapies:not indicated  2. Cardiac sarcoid - Vaccinations confirmed.  - PET from 02/09/23 w/ LVEF 37% - Currently on cellcept 1000mg  BID & prednisone 15mg  daily.  - Will decrease prednisone to 10mg  daily, discontinue bactrim and plan for repeat PET to assess disease activity.       Advanced Heart Failure Mechanical Circulatory Support

## 2023-07-08 ENCOUNTER — Encounter (HOSPITAL_COMMUNITY): Payer: Self-pay | Admitting: Cardiology

## 2023-07-08 ENCOUNTER — Ambulatory Visit (HOSPITAL_COMMUNITY)
Admission: RE | Admit: 2023-07-08 | Discharge: 2023-07-08 | Disposition: A | Discharge status: Home / Self Care | Payer: Medicaid Other | Source: Ambulatory Visit | Attending: Cardiology | Admitting: Cardiology

## 2023-07-08 VITALS — BP 120/64 | HR 86 | Wt 201.6 lb

## 2023-07-08 DIAGNOSIS — I252 Old myocardial infarction: Secondary | ICD-10-CM | POA: Insufficient documentation

## 2023-07-08 DIAGNOSIS — D84821 Immunodeficiency due to drugs: Secondary | ICD-10-CM

## 2023-07-08 DIAGNOSIS — Z7952 Long term (current) use of systemic steroids: Secondary | ICD-10-CM | POA: Insufficient documentation

## 2023-07-08 DIAGNOSIS — D8685 Sarcoid myocarditis: Secondary | ICD-10-CM | POA: Diagnosis not present

## 2023-07-08 DIAGNOSIS — D849 Immunodeficiency, unspecified: Secondary | ICD-10-CM

## 2023-07-08 DIAGNOSIS — I255 Ischemic cardiomyopathy: Secondary | ICD-10-CM | POA: Insufficient documentation

## 2023-07-08 DIAGNOSIS — Z7960 Long term (current) use of unspecified immunomodulators and immunosuppressants: Secondary | ICD-10-CM

## 2023-07-08 DIAGNOSIS — I5020 Unspecified systolic (congestive) heart failure: Secondary | ICD-10-CM | POA: Insufficient documentation

## 2023-07-08 DIAGNOSIS — Z79624 Long term (current) use of inhibitors of nucleotide synthesis: Secondary | ICD-10-CM | POA: Insufficient documentation

## 2023-07-08 DIAGNOSIS — I4729 Other ventricular tachycardia: Secondary | ICD-10-CM | POA: Diagnosis not present

## 2023-07-08 DIAGNOSIS — I5042 Chronic combined systolic (congestive) and diastolic (congestive) heart failure: Secondary | ICD-10-CM

## 2023-07-08 DIAGNOSIS — G4733 Obstructive sleep apnea (adult) (pediatric): Secondary | ICD-10-CM | POA: Insufficient documentation

## 2023-07-08 DIAGNOSIS — Z79899 Other long term (current) drug therapy: Secondary | ICD-10-CM | POA: Insufficient documentation

## 2023-07-08 DIAGNOSIS — I2510 Atherosclerotic heart disease of native coronary artery without angina pectoris: Secondary | ICD-10-CM | POA: Diagnosis not present

## 2023-07-08 DIAGNOSIS — Z5181 Encounter for therapeutic drug level monitoring: Secondary | ICD-10-CM

## 2023-07-08 DIAGNOSIS — I110 Hypertensive heart disease with heart failure: Secondary | ICD-10-CM | POA: Diagnosis not present

## 2023-07-08 DIAGNOSIS — Z955 Presence of coronary angioplasty implant and graft: Secondary | ICD-10-CM | POA: Diagnosis not present

## 2023-07-08 LAB — CBC WITH DIFFERENTIAL/PLATELET
Abs Immature Granulocytes: 0.04 10*3/uL (ref 0.00–0.07)
Basophils Absolute: 0 10*3/uL (ref 0.0–0.1)
Basophils Relative: 0 %
Eosinophils Absolute: 0.1 10*3/uL (ref 0.0–0.5)
Eosinophils Relative: 2 %
HCT: 37.6 % (ref 36.0–46.0)
Hemoglobin: 12.3 g/dL (ref 12.0–15.0)
Immature Granulocytes: 1 %
Lymphocytes Relative: 41 %
Lymphs Abs: 2.9 10*3/uL (ref 0.7–4.0)
MCH: 27.6 pg (ref 26.0–34.0)
MCHC: 32.7 g/dL (ref 30.0–36.0)
MCV: 84.3 fL (ref 80.0–100.0)
Monocytes Absolute: 1 10*3/uL (ref 0.1–1.0)
Monocytes Relative: 14 %
Neutro Abs: 3.1 10*3/uL (ref 1.7–7.7)
Neutrophils Relative %: 42 %
Platelets: 355 10*3/uL (ref 150–400)
RBC: 4.46 MIL/uL (ref 3.87–5.11)
RDW: 14.7 % (ref 11.5–15.5)
WBC: 7.1 10*3/uL (ref 4.0–10.5)
nRBC: 0 % (ref 0.0–0.2)

## 2023-07-08 LAB — BASIC METABOLIC PANEL
Anion gap: 9 (ref 5–15)
BUN: 19 mg/dL (ref 8–23)
CO2: 26 mmol/L (ref 22–32)
Calcium: 9.6 mg/dL (ref 8.9–10.3)
Chloride: 98 mmol/L (ref 98–111)
Creatinine, Ser: 1.11 mg/dL — ABNORMAL HIGH (ref 0.44–1.00)
GFR, Estimated: 56 mL/min — ABNORMAL LOW (ref >60)
Glucose, Bld: 160 mg/dL — ABNORMAL HIGH (ref 70–99)
Potassium: 4.3 mmol/L (ref 3.5–5.1)
Sodium: 133 mmol/L — ABNORMAL LOW (ref 135–145)

## 2023-07-08 LAB — BRAIN NATRIURETIC PEPTIDE: B Natriuretic Peptide: 155.3 pg/mL — ABNORMAL HIGH (ref 0.0–100.0)

## 2023-07-08 MED ORDER — SULFAMETHOXAZOLE-TRIMETHOPRIM 800-160 MG PO TABS: 1.0000 | ORAL_TABLET | ORAL | Status: AC

## 2023-07-08 MED ORDER — MYCOPHENOLATE MOFETIL 500 MG PO TABS: 1500.0000 mg | ORAL_TABLET | Freq: Two times a day (BID) | ORAL | 5 refills | Status: DC

## 2023-07-08 MED ORDER — PREDNISONE 10 MG PO TABS: 10.0000 mg | ORAL_TABLET | Freq: Every day | ORAL | 5 refills | Status: DC

## 2023-07-08 MED ORDER — METOPROLOL SUCCINATE ER 50 MG PO TB24: 50.0000 mg | ORAL_TABLET | Freq: Every day | ORAL | 6 refills | Status: DC

## 2023-07-08 NOTE — Patient Instructions (Signed)
Medication Changes:  DECREASE PREDNISONE TO 10MG  DAILY   INCREASE CELLCEPT TO 1500MG  TWICE DAILY   INCREASE METOPROLOL SUCCINATE TO 50MG  DAILY   STOP TAKING BACTRIM NEXT WEEK   Lab Work:  Labs done today, your results will be available in MyChart, we will contact you for abnormal readings.  Testing/Procedures:   Cardiac Sarcoidosis/Inflammation PET Scan Patient Instructions  Please report to Radiology at the [facility] Main Entrance 15 minutes early for your test.  [address] [place], [place] [address] BRING FOOD DIARY WITH YOU TO THIS APPOINTMENT For 24 hours before the test: Do not exercise! Do not eat after 5 pm the day before your test! To make sure that your scanning results are accurate, you MUST follow the sarcoid prep meal diet starting the day before your PET scan. This diet involves eating no carbohydrates 24 hours before the test.  You will keep a log of all that you eat the day before your test. If you have questions or do not understand this diet, please call [phone] for more information. If you are unable to follow this diet, please discuss an alternative strategy with the coordinator.  If you are diabetic, continue your diabetes medications as usual on the day before until you begin to fast. NO DIABETES MEDICATIONS ONCE YOU BEGIN TO FAST. What foods can I eat the day before my test?  Drink only water or black coffee (WITHOUT sugar, artificial sweetener, cream, or milk). Eggs (prepared without milk or cheese)  Meat that is either broiled or pan fried in butter WITHOUT breading (chicken, [place], bacon, meat-only sausage, hamburger, steak, fish) Butter, salt & pepper What foods must I AVOID the day before my test?  Do not consume alcoholic beverages, sodas, fruit juice, coffee creamer, or sports drinks  Do not eat vegetables, beans, nuts, fruits, juices, bread, grains, rice, pasta, potatoes, or any baked goods Do not eat dairy products (milk,  cheese, etc.)  Do not eat mayonnaise, ketchup, tartar sauce, mustard, or other condiments Do not add sugar, artificial sweeteners, or Splenda (sucralose) to foods or drinks  Do not eat breaded foods (like fried chicken)  Do not eat sweets, candy, gum, sweetened cough drops, lozenges, or sugar  Do not eat sweetened, grilled, or cured meats or meat with carbohydrate-containing additives (some sausages, ham, sweetened bacon) Suggested items for breakfast, lunch, or dinner:  Breakfast  3 to 5 fatty sausage links fried in butter. 3 to 5 bacon strips.  3 eggs pan fried in butter (no milk or cheese).  Lunch/Dinner  2 hamburger patties fried in butter. Chicken or fatty fish pan fried in butter. No breading. 8 oz. fatty steak pan fried in butter.  Beverages  Drink only water or black coffee. DO NOT ADD SUGAR, ARTIFICIAL SWEETENER, CREAM, OR MILK   For more information and frequently asked questions, please visit our website : [email or web address]   Cardiac Sarcoidosis/Inflammation PET Scan  Food Diary Name: _____________________________ Please fill in EXACTLY what you have eaten and when for 24 hours PRIOR to your test date.  Time Food/Drink Comments  Breakfast                Lunch                Dinner                Snacks                 DO NOT EXERCISE THE  DAY BEFORE YOUR TEST DO NOT EAT AFTER 5 PM THE DAY BEFORE YOUR TEST.  ON THE DAY OF YOUR TEST, DO NOT EAT ANY FOOD AND ONLY DRINK CLEAR WATER! PLEASE BRING THIS FOOD DIARY WITH YOU TO YOUR APPOINTMENT   Follow-Up in: 3 MONTHS WITH DR. Gasper Lloyd PLEASE CALL OUR OFFICE AROUND MID SEPTEMBER TO GET SCHEDULED FOR YOUR APPOINTMENT. PHONE NUMBER IS [phone] OPTION 2   At the Advanced Heart Failure Clinic, you and your health needs are our priority. We have a designated team specialized in the treatment of Heart Failure. This Care Team includes your primary Heart Failure Specialized Cardiologist (physician),  Advanced Practice Providers (APPs- Physician Assistants and Nurse Practitioners), and Pharmacist who all work together to provide you with the care you need, when you need it.   You may see any of the following providers on your designated Care Team at your next follow up:  Dr. Arvilla Meres Dr. Marca Ancona Dr. Marcos Eke, NP Robbie Lis, [place] [facility] [place], [place] Brynda Peon, NP Karle Plumber, PharmD   Please be sure to bring in all your medications bottles to every appointment.   Need to Contact us:  If you have any questions or concerns before your next appointment please send [place] a message through [place] or call our office at [phone].    TO LEAVE A MESSAGE FOR THE NURSE SELECT OPTION 2, PLEASE LEAVE A MESSAGE INCLUDING: YOUR NAME DATE OF BIRTH CALL BACK NUMBER REASON FOR CALL**this is important as we prioritize the call backs  YOU WILL RECEIVE A CALL BACK THE SAME DAY AS LONG AS YOU CALL BEFORE 4:00 PM

## 2023-07-23 ENCOUNTER — Other Ambulatory Visit (HOSPITAL_COMMUNITY): Payer: Self-pay

## 2023-07-23 ENCOUNTER — Encounter (HOSPITAL_BASED_OUTPATIENT_CLINIC_OR_DEPARTMENT_OTHER): Payer: Self-pay | Admitting: Pharmacist Clinician (PhC)/ Clinical Pharmacy Specialist

## 2023-07-23 MED ORDER — TRULICITY 4.5 MG/0.5ML ~~LOC~~ SOAJ
4.5000 mg | SUBCUTANEOUS | 3 refills | Status: DC
  Filled 2023-07-23 – 2023-09-15 (×4): qty 2, 28d supply, fill #0

## 2023-07-24 ENCOUNTER — Other Ambulatory Visit (HOSPITAL_COMMUNITY): Payer: Self-pay

## 2023-07-30 ENCOUNTER — Other Ambulatory Visit (HOSPITAL_COMMUNITY): Payer: Self-pay

## 2023-07-30 NOTE — Telephone Encounter (Addendum)
ERROR

## 2023-08-06 ENCOUNTER — Other Ambulatory Visit (HOSPITAL_COMMUNITY): Payer: Self-pay

## 2023-08-16 ENCOUNTER — Encounter (HOSPITAL_BASED_OUTPATIENT_CLINIC_OR_DEPARTMENT_OTHER): Payer: Self-pay

## 2023-08-17 ENCOUNTER — Ambulatory Visit (INDEPENDENT_AMBULATORY_CARE_PROVIDER_SITE_OTHER): Payer: Medicare Other | Admitting: Podiatry

## 2023-08-17 ENCOUNTER — Other Ambulatory Visit (HOSPITAL_COMMUNITY): Payer: Self-pay

## 2023-08-17 DIAGNOSIS — Q828 Other specified congenital malformations of skin: Secondary | ICD-10-CM

## 2023-08-17 DIAGNOSIS — E1142 Type 2 diabetes mellitus with diabetic polyneuropathy: Secondary | ICD-10-CM

## 2023-08-17 DIAGNOSIS — M2041 Other hammer toe(s) (acquired), right foot: Secondary | ICD-10-CM

## 2023-08-17 DIAGNOSIS — M2042 Other hammer toe(s) (acquired), left foot: Secondary | ICD-10-CM

## 2023-08-17 DIAGNOSIS — E119 Type 2 diabetes mellitus without complications: Secondary | ICD-10-CM | POA: Diagnosis not present

## 2023-08-17 DIAGNOSIS — B353 Tinea pedis: Secondary | ICD-10-CM | POA: Diagnosis not present

## 2023-08-17 DIAGNOSIS — B351 Tinea unguium: Secondary | ICD-10-CM

## 2023-08-17 DIAGNOSIS — M79609 Pain in unspecified limb: Secondary | ICD-10-CM | POA: Diagnosis not present

## 2023-08-17 MED ORDER — KETOCONAZOLE 2 % EX CREA: TOPICAL_CREAM | CUTANEOUS | 1 refills | Status: DC

## 2023-08-22 ENCOUNTER — Encounter: Payer: Self-pay | Admitting: Podiatry

## 2023-08-22 NOTE — Progress Notes (Signed)
ANNUAL DIABETIC FOOT EXAM  Subjective: Marie Tanner presents today annual diabetic foot exam.  Chief Complaint  Patient presents with   Diabetes    DFC BS - DIDN'T CHECK IT  A1C - 6.7   Callouses    CALLUS BILAT   Patient confirms h/o diabetes.  Patient denies any h/o foot wounds.  Patient has been diagnosed with neuropathy.  Risk factors: diabetes, diabetic neuropathy, history of gout, h/o DVT, h/o MI, HTN, CAD, CHF.  Quita Skye, PA-C is patient's PCP.  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   GERD (gastroesophageal reflux disease)    HCAP (healthcare-associated pneumonia) 11/22/2014   Heart murmur    High cholesterol    History of blood transfusion 09/2014   related to MI   History of gout    Hypertension    Liver laceration 10/19/2014   Palpitations 12/12/2020   Pulmonary edema 10/19/2014   Renal disorder    Renal disorder    Right hip pain 04/03/2021   Sleep apnea    "suppose to wear mask but I don't" (11/22/2014)   STEMI (ST elevation myocardial infarction) (HCC) 10/19/2014   Type II diabetes mellitus (HCC)    Urticaria    Patient Active Problem List   Diagnosis Date Noted   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   Stented coronary artery 03/17/2023   Coronary artery disease involving native coronary artery of native heart without angina pectoris 10/13/2022   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) 04/06/2022   Type 2 diabetes mellitus with hyperglycemia, without long-term current use of insulin (HCC) 04/06/2022   VT (ventricular tachycardia) (HCC) 04/06/2022   Class 2 severe obesity due to excess calories with serious comorbidity and body mass index (BMI) of 36.0 to 36.9 in adult [facility]) 04/06/2022    Right hip pain 04/03/2021   Palpitations 12/12/2020   Allergic urticaria 10/31/2019   Dermographia 10/31/2019   Other allergic rhinitis 10/31/2019   Current use of beta blocker 10/31/2019   Allergy to insect bites 10/31/2019   Hx of colonic polyp 01/03/2018   Angina, class III (HCC) 12/07/2017   Abnormal nuclear stress test 12/07/2017   Type II diabetes mellitus (HCC)    Sleep apnea    Renal disorder    History of gout    Heart murmur    GERD (gastroesophageal reflux disease)    CAD S/P percutaneous coronary angioplasty    Cancer (HCC)    Breast cancer (HCC)    Hyperlipidemia LDL goal <70 11/16/2016   History of breast cancer 01/24/2016   Abnormal finding on mammography 01/17/2016   Chest pain 06/06/2015   Diarrhea 06/06/2015   Essential hypertension 06/06/2015   Depression    Encounter for therapeutic drug monitoring 02/19/2015   Mitral regurgitation 01/30/2015   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) 01/30/2015   Chronic combined systolic and diastolic heart failure (HCC) 01/30/2015   Dyspnea 11/24/2014   Cardiomyopathy (HCC) 11/24/2014   DVT (deep venous thrombosis) (HCC) 11/24/2014   DM type 2 (diabetes mellitus, type 2) (HCC) 11/24/2014   HCAP (healthcare-associated pneumonia) 11/22/2014   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) 11/04/2014   Controlled type 2 diabetes mellitus without complication (HCC) 10/31/2014   High  sodium levels 10/29/2014   Elevated WBC count 10/29/2014   Acute confusion due to medical condition 10/27/2014   Insomnia due to medical condition 10/27/2014   Acute respiratory failure (HCC) 10/24/2014   Injury of kidney 10/20/2014   Laceration of liver 10/20/2014   STEMI (ST elevation myocardial infarction) (HCC) 10/19/2014   Pulmonary edema 10/19/2014   Liver laceration 10/19/2014   History of blood transfusion 09/30/2014   Diabetes mellitus (HCC) 04/19/2013   Past Surgical History:  Procedure  Laterality Date   ABDOMINAL HYSTERECTOMY  ~ 2000   APPLICATION OF WOUND VAC  10/2014   "over my naval" (11/22/2014)   BREAST LUMPECTOMY Right 2017   BREAST LUMPECTOMY WITH AXILLARY LYMPH NODE BIOPSY     BREAST SURGERY     CESAREAN SECTION  1980; 1982   CORONARY ANGIOPLASTY WITH STENT PLACEMENT  09/2014   "multiple"/notes 11/22/2014   EXTRACORPOREAL CIRCULATION     Femerol artery repair Right 09/2014   pt insists it was a right femoral artery repair on 11/22/2014   LAPAROSCOPIC ABDOMINAL EXPLORATION     LEFT HEART CATH AND CORONARY ANGIOGRAPHY N/A 12/07/2017   Procedure: LEFT HEART CATH AND CORONARY ANGIOGRAPHY;  Surgeon: Marykay Lex, MD;  Location: [facility] INVASIVE CV LAB;  Service: Cardiovascular;  Laterality: N/A;   LEFT VENTRICULAR ASSIST DEVICE     pt is not aware of this hx on 11/22/2014   Liver laceration repair  09/2014   related to CPR/notes 11/22/2014   RIGHT/LEFT HEART CATH AND CORONARY ANGIOGRAPHY N/A 04/21/2021   Procedure: RIGHT/LEFT HEART CATH AND CORONARY ANGIOGRAPHY;  Surgeon: Yvonne Kendall, MD;  Location: [facility] INVASIVE CV LAB;  Service: Cardiovascular;  Laterality: N/A;   TEE WITHOUT CARDIOVERSION N/A 03/13/2021   Procedure: TRANSESOPHAGEAL ECHOCARDIOGRAM (TEE);  Surgeon: Parke Poisson, MD;  Location: [facility] ENDOSCOPY;  Service: Cardiology;  Laterality: N/A;   TRACHEOSTOMY  09/2014   "closed on it's own when they took it out"   TUBAL LIGATION  1982   Current Outpatient Medications on File Prior to Visit  Medication Sig Dispense Refill   acetaminophen (TYLENOL) 500 MG tablet Take 1,000 mg by mouth every 6 (six) hours as needed for moderate pain.     aspirin EC 81 MG tablet Take 1 tablet (81 mg total) by mouth daily.     blood glucose meter kit and supplies KIT by Does not apply route daily as needed. Dispense based on patient and insurance preference. Use up to four times daily as directed. (FOR ICD-9 250.00, 250.01).     calcium-vitamin D (OSCAL WITH D) 500-5  MG-MCG tablet Take 1 tablet by mouth 2 (two) times daily.     cholecalciferol (VITAMIN D3) 25 MCG (1000 UNIT) tablet Take 1,000 Units by mouth daily.     Dulaglutide (TRULICITY) 4.5 MG/0.5ML SOPN Inject 4.5 mg as directed once a week. 2 mL 3   Evolocumab (REPATHA SURECLICK) 140 MG/ML SOAJ Inject 140 mg into the skin every 14 (fourteen) days. 2 mL 5   furosemide (LASIX) 40 MG tablet TAKE 1 TABLET ONCE PER WEEK WITH ADDITIONAL TABLET AS NEEDED FOR SHORTNESS OF BREATH OR WEIGHT GAIN OF 2 POUNDS IN 24 HOURS OR 5 POUNDS IN 1 WEEK 30 tablet 3   glucose blood test strip Use as instructed. E11.5. To check blood glucose daily. 100 each 12   magnesium oxide (MAG-OX) 400 MG tablet TAKE 1 TABLET BY MOUTH EVERY DAY 90 tablet 0   metoprolol succinate (TOPROL-XL) 50 MG 24 hr tablet Take  1 tablet (50 mg total) by mouth daily. 30 tablet 6   mycophenolate (CELLCEPT) 500 MG tablet Take 3 tablets (1,500 mg total) by mouth 2 (two) times daily. 180 tablet 5   nitroGLYCERIN (NITROSTAT) 0.4 MG SL tablet Place 1 tablet (0.4 mg total) under the tongue every 5 (five) minutes as needed for chest pain. max of 3 if no relief call 911 25 tablet PRN   pantoprazole (PROTONIX) 40 MG tablet Take 1 tablet (40 mg total) by mouth daily. 30 tablet 5   predniSONE (DELTASONE) 10 MG tablet Take 1 tablet (10 mg total) by mouth daily with breakfast. 30 tablet 5   rosuvastatin (CRESTOR) 20 MG tablet TAKE 1 TABLET BY MOUTH 3 TIMES PER WEEK 40 tablet 5   sacubitril-valsartan (ENTRESTO) 97-103 MG Take 1 tablet by mouth 2 (two) times daily. 180 tablet 3   spironolactone (ALDACTONE) 25 MG tablet Take 1 tablet (25 mg total) by mouth daily. 30 tablet 11   ticagrelor (BRILINTA) 60 MG TABS tablet Take 1 tablet (60 mg total) by mouth 2 (two) times daily. 180 tablet 3   No current facility-administered medications on file prior to visit.    Allergies  Allergen Reactions   Dilaudid [Hydromorphone Hcl] Anaphylaxis, Itching and Swelling   Epinephrine  Anxiety and Other (See Comments)    Severe anxiety   Lactose Intolerance (Gi) Diarrhea   Jardiance [Empagliflozin]     YEAST INFECTION    Social History   Occupational History   Not on file  Tobacco Use   Smoking status: Never   Smokeless tobacco: Never  Vaping Use   Vaping status: Never Used  Substance and Sexual Activity   Alcohol use: Yes    Comment: occ   Drug use: No   Sexual activity: Not Currently   Family History  Problem Relation Age of Onset   Diabetes Mother    Hypertension Mother    COPD Mother    Heart disease Father    Heart attack Father    Alzheimer's disease Father    Diabetes Brother    Asthma Daughter    Stroke Neg Hx    Colon cancer Neg Hx    Stomach cancer Neg Hx    Pancreatic cancer Neg Hx    AAA (abdominal aortic aneurysm) Neg Hx    Immunization History  Administered Date(s) Administered   Influenza,inj,Quad PF,6+ Mos 10/28/2017, 10/03/2018   Influenza-Unspecified 08/30/2014   PFIZER Comirnaty(Gray Top)Covid-19 Tri-Sucrose Vaccine 12/31/2020   PNEUMOCOCCAL CONJUGATE-20 08/17/2022   Pneumococcal Polysaccharide-23 01/28/2017   Tdap 05/02/2018   Zoster Recombinant(Shingrix) 08/17/2022     Review of Systems: Negative except as noted in the HPI.   Objective: There were no vitals filed for this visit.  Kaitylyn Mugavero is a pleasant 67 y.o. female in NAD. AAO X 3.  Vascular Examination: Capillary refill time immediate b/l. Vascular status intact b/l with palpable pedal pulses. Pedal hair present b/l. No pain with calf compression b/l. Skin temperature gradient WNL b/l. No cyanosis or clubbing b/l. No ischemia or gangrene noted b/l. Trace edema noted BLE.  Neurological Examination: Sensation grossly intact b/l with 10 gram monofilament. Vibratory sensation intact b/l. Pt has subjective symptoms of neuropathy.  Dermatological Examination: Pedal skin with normal turgor, texture and tone b/l.  No open wounds. No interdigital macerations.    Toenails 1-5 b/l thick, discolored, elongated with subungual debris and pain on dorsal palpation.   Porokeratotic lesion(s) left fifth digit, submet head 2 b/l, and submet head 3  b/l. No erythema, no edema, no drainage, no fluctuance. Diffuse scaling noted peripherally and plantarly b/l feet.  No interdigital macerations.  No blisters, no weeping. No signs of secondary bacterial infection noted.  Musculoskeletal Examination: Normal muscle strength 5/5 to all lower extremity muscle groups bilaterally. Hammertoe(s) noted to the 2-5 left foot and 2-5 right foot.[name] No pain, crepitus or joint limitation noted with ROM b/l LE.  Patient ambulates independently without assistive aids.  Radiographs: None  Lab Results  Component Value Date   HGBA1C 7.0 (A) 01/22/2020   ADA Risk Categorization: Low Risk :  Patient has all of the following: Intact protective sensation No prior foot ulcer  No severe deformity Pedal pulses present  Assessment: 1. Pain due to onychomycosis of nail   2. Porokeratosis   3. Tinea pedis of both feet   4. Acquired hammertoes of both feet   5. Diabetic peripheral neuropathy associated with type 2 diabetes mellitus (HCC)   6. Encounter for diabetic foot exam (HCC)     Plan: Meds ordered this encounter  Medications   ketoconazole (NIZORAL) 2 % cream    Sig: Apply to both feet and between toes once daily for 6 weeks.    Dispense:  60 g    Refill:  1   -Consent given for treatment as described below: -Diabetic foot examination performed today. -Continue diabetic foot care principles: inspect feet daily, monitor glucose as recommended by PCP and/or Endocrinologist, and follow prescribed diet per PCP, Endocrinologist and/or dietician. -Patient to continue soft, supportive shoe gear daily. -Toenails 1-5 b/l were debrided in length and girth with sterile nail nippers and dremel without iatrogenic bleeding.  -Porokeratotic lesion(s) left fifth digit, submet head 2  b/l, and submet head 3 b/l pared and enucleated with sterile currette without incident. Total number of lesions debrided=5. -Patient/POA to call should there be question/concern in the interim. Return in about 3 months (around 11/16/2023).  Freddie Breech, DPM

## 2023-08-23 ENCOUNTER — Telehealth (HOSPITAL_COMMUNITY): Payer: Self-pay | Admitting: *Deleted

## 2023-09-06 ENCOUNTER — Other Ambulatory Visit (HOSPITAL_COMMUNITY): Payer: Self-pay

## 2023-09-15 ENCOUNTER — Other Ambulatory Visit (HOSPITAL_COMMUNITY): Payer: Self-pay

## 2023-09-15 ENCOUNTER — Encounter (HOSPITAL_BASED_OUTPATIENT_CLINIC_OR_DEPARTMENT_OTHER): Payer: Self-pay | Admitting: Pharmacist Clinician (PhC)/ Clinical Pharmacy Specialist

## 2023-09-15 MED ORDER — TRULICITY 4.5 MG/0.5ML ~~LOC~~ SOAJ: 4.5000 mg | SUBCUTANEOUS | 11 refills | Status: AC

## 2023-09-16 ENCOUNTER — Other Ambulatory Visit (HOSPITAL_COMMUNITY): Payer: Self-pay

## 2023-09-16 ENCOUNTER — Telehealth: Payer: Self-pay | Admitting: Pharmacy Technician

## 2023-09-16 NOTE — Telephone Encounter (Signed)
Pharmacy Patient Advocate Encounter  Received notification from [employer] that Prior Authorization for trulicity has been APPROVED from 09/16/23 to 11/30/23   PA #/Case ID/Reference #: [id number]

## 2023-09-16 NOTE — Telephone Encounter (Signed)
Pharmacy Patient Advocate Encounter   Received notification from Fax that prior authorization for trulicity is required/requested.   Insurance verification completed.   The patient is insured through [employer] .   Per test claim: PA required; PA submitted to [employer] via CoverMyMeds Key/confirmation #/EOC [id number] Status is pending

## 2023-10-13 ENCOUNTER — Telehealth (HOSPITAL_COMMUNITY): Payer: Self-pay

## 2023-10-13 NOTE — Telephone Encounter (Signed)
Not sure if you have sent this to scheduling or not but patient called wanting to know the status of her PET scan being scheduled.

## 2023-11-03 NOTE — Progress Notes (Signed)
ADVANCED HEART FAILURE CLINIC NOTE  Referring Physician: Quita Skye, PA-C  Primary Care: Quita Skye, PA-C Primary Cardiologist: Dr. Gasper Lloyd  HPI: Marie Tanner is a 67 y.o. female with coronary artery disease status post STEMI, hypertension, chronic systolic heart failure, obstructive sleep apnea not on CPAP, type 2 diabetes, history of DVT, breast cancer that presents today to establish care.  It appears that Marie Tanner's cardiac history dates back to 2015 when she had a STEMI complicated by cardiac arrest at [facility].  She received 2 stents to the left main/LAD and circumflex.  During this time she had a prolonged hospitalization requiring tracheostomy; liver laceration during CPR which required surgical repair and also developed a DVT during this time.  She underwent repeat left heart cath in January 2019 that demonstrated patent stents which were heavily calcified with other areas of nonobstructive CAD.  EF at that time was 35 to 45%.  With increasing GDMT she had improvement in EF by late 2019 to 40 to 50%.  Due to moderate to severe eccentric mitral regurgitation she had a TEE in 2022 and repeat right and left heart cath with structural evaluation in May 2022 that showed patent left main and LAD stents normal filling pressures and a normal transmitral gradient.  She was deferred for medical management only.  At some point she had a cardiac MRI with noncoronary distribution of LGE followed by a cardiac PET that showed FDG uptake consistent with active inflammation/sarcoid.  Interval hx:  - Overall she reports feeling "pretty good".  - Prior to starting immunosuppression she felt very fatigued most of the week; she now reports feeling significant improvement in energy levels; only fatigued 1-2 days weekly.  - Reports resolution of symptomatic palpitations.   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   GERD (gastroesophageal reflux disease)    HCAP (healthcare-associated pneumonia) 11/22/2014   Heart murmur    High cholesterol    History of blood transfusion 09/2014   related to MI   History of gout    Hypertension    Liver laceration 10/19/2014   Palpitations 12/12/2020   Pulmonary edema 10/19/2014   Renal disorder    Renal disorder    Right hip pain 04/03/2021   Sleep apnea    "suppose to wear mask but I don't" (11/22/2014)   STEMI (ST elevation myocardial infarction) (HCC) 10/19/2014   Type II diabetes mellitus (HCC)    Urticaria     Current Outpatient Medications  Medication Sig Dispense Refill   acetaminophen (TYLENOL) 500 MG tablet Take 1,000 mg by mouth every 6 (six) hours as needed for moderate pain.     aspirin EC 81 MG tablet Take 1 tablet (81 mg total) by mouth daily.     blood glucose meter kit and supplies KIT by Does not apply route daily as needed. Dispense based on patient and insurance preference. Use up to four times daily as directed. (FOR ICD-9 250.00, 250.01).     calcium-vitamin D (  OSCAL WITH D) 500-5 MG-MCG tablet Take 1 tablet by mouth 2 (two) times daily.     cholecalciferol (VITAMIN D3) 25 MCG (1000 UNIT) tablet Take 1,000 Units by mouth daily.     Dulaglutide (TRULICITY) 4.5 MG/0.5ML SOPN Inject 4.5 mg as directed once a week. 2 mL 11   Evolocumab (REPATHA SURECLICK) 140 MG/ML SOAJ Inject 140 mg into the skin every 14 (fourteen) days. 2 mL 5   furosemide (LASIX) 40 MG tablet TAKE 1 TABLET ONCE PER WEEK WITH  ADDITIONAL TABLET AS NEEDED FOR SHORTNESS OF BREATH OR WEIGHT GAIN OF 2 POUNDS IN 24 HOURS OR 5 POUNDS IN 1 WEEK 30 tablet 3   glucose blood test strip Use as instructed. E11.5. To check blood glucose daily. 100 each 12   ketoconazole (NIZORAL) 2 % cream Apply to both feet and between toes once daily for 6 weeks. 60 g 1   magnesium oxide (MAG-OX) 400 MG tablet TAKE 1 TABLET BY MOUTH EVERY DAY 90 tablet 0   metoprolol succinate (TOPROL-XL) 50 MG 24 hr tablet Take 1 tablet (50 mg total) by mouth daily. 30 tablet 6   mycophenolate (CELLCEPT) 500 MG tablet Take 3 tablets (1,500 mg total) by mouth 2 (two) times daily. 180 tablet 5   nitroGLYCERIN (NITROSTAT) 0.4 MG SL tablet Place 1 tablet (0.4 mg total) under the tongue every 5 (five) minutes as needed for chest pain. max of 3 if no relief call 911 25 tablet PRN   pantoprazole (PROTONIX) 40 MG tablet Take 1 tablet (40 mg total) by mouth daily. 30 tablet 5   predniSONE (DELTASONE) 10 MG tablet Take 1 tablet (10 mg total) by mouth daily with breakfast. 30 tablet 5   rosuvastatin (CRESTOR) 20 MG tablet TAKE 1 TABLET BY MOUTH 3 TIMES PER WEEK 40 tablet 5   sacubitril-valsartan (ENTRESTO) 97-103 MG Take 1 tablet by mouth 2 (two) times daily. 180 tablet 3   spironolactone (ALDACTONE) 25 MG tablet Take 1 tablet (25 mg total) by mouth daily. 30 tablet 11   ticagrelor (BRILINTA) 60 MG TABS tablet Take 1 tablet (60 mg total) by mouth 2 (two) times daily. 180 tablet 3   No current facility-administered medications for this visit.    Allergies  Allergen Reactions   Dilaudid [Hydromorphone Hcl] Anaphylaxis, Itching and Swelling   Epinephrine Anxiety and Other (See Comments)    Severe anxiety   Lactose Intolerance (Gi) Diarrhea   Jardiance [Empagliflozin]     YEAST INFECTION       Social History   Socioeconomic History   Marital status: Divorced    Spouse name: Not on file   Number of children: 2   Years of education: Not on file   Highest education  level: Not on file  Occupational History   Not on file  Tobacco Use   Smoking status: Never   Smokeless tobacco: Never  Vaping Use   Vaping status: Never Used  Substance and Sexual Activity   Alcohol use: Yes    Comment: occ   Drug use: No   Sexual activity: Not Currently  Other Topics Concern   Not on file  Social History Narrative   Not on file   Social Determinants of Health   Financial Resource Strain: Not on File (03/19/2022)   Received from [employer], [employer]    Financial Resource Strain: 0  Food Insecurity: Declined (04/06/2023)   Received from [place], [place], [employer] Insecurity  Food: 99  Transportation Needs: Not at Risk (04/06/2023)   Received from [place], [employer] Needs    Transportation: 1  Physical Activity: Not on File (03/19/2022)   Received from [place], [place]   Physical Activity    Physical Activity: 0  Stress: Not on File (03/19/2022)   Received from [facility], [place]   Stress    Stress: 0  Social Connections: Not on File (08/04/2023)   Received from [employer]   Social Connections    Connectedness: 0  Intimate Partner Violence: Not on file      Family History  Problem Relation Age of Onset   Diabetes Mother    Hypertension Mother    COPD Mother    Heart disease Father    Heart attack Father    Alzheimer's disease Father    Diabetes Brother    Asthma Daughter    Stroke Neg Hx    Colon cancer Neg Hx    Stomach cancer Neg Hx    Pancreatic cancer Neg Hx    AAA (abdominal aortic aneurysm) Neg Hx     PHYSICAL EXAM: There were no vitals filed for this visit.  GENERAL: Well nourished, well developed, and in no apparent distress at rest.  HEENT: Negative for arcus senilis or xanthelasma. There is no scleral icterus.  The mucous membranes are pink and moist.   NECK: Supple, No masses. Normal carotid upstrokes without bruits. No masses or thyromegaly.    CHEST: There are no chest wall deformities. There is no chest wall  tenderness. Respirations are unlabored.  Lungs- CTA B/L CARDIAC:  JVP: 7 cm          Normal rate with regular rhythm. No murmurs, rubs or gallops.  Pulses are 2+ and symmetrical in upper and lower extremities. No edema.  ABDOMEN: Soft, non-tender, non-distended. There are no masses or hepatomegaly. There are normal bowel sounds.  EXTREMITIES: Warm and well perfused with no cyanosis, clubbing.  LYMPHATIC: No axillary or supraclavicular lymphadenopathy.  NEUROLOGIC: Patient is oriented x3 with no focal or lateralizing neurologic deficits.  PSYCH: Patients affect is appropriate, there is no evidence of anxiety or depression.  SKIN: Warm and dry; no lesions or wounds.     DATA REVIEW  ECG: 03/26/23: NSR  As per my personal interpretation  ECHO: 05/07/22: LVEF 35%-40%, eccentric moderate - severe MR, normal RV function As per my personal interpretation  CATH: 04/21/21:   Nonobstructive coronary artery disease with up to 40% stenosis involving the mid LAD and mid LCx.  Overall appearance is similar to prior catheterization in 2019. Patent LMCA/LAD stent. Normal left and right heart filling pressures. Mild pulmonary hypertension. Normal to supranormal Fick cardiac output/index. Normal transmitral gradient.  CMR:  11/03/22:  1. There is subendocardial LGE in apical anterior and mid to apical inferior walls, which could be consistent with LAD territory infarct as patient has known large wrap-around LAD, but the apex is spared, which argues against this. In addition, there is focal area of subendocardial LGE suggesting infarct in the basal anteroseptum, which is not in a coronary distribution. There is also mid inferoseptal midwall LGE, which is a nonischemic scar pattern. 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 Cardiac sarcoid is on the differential, as can cause both subendocardial and midwall LGE. Recommend cardiac PET to evaluate  for sarcoid.  2. Mild LV hypertrophy, normal size, and moderate systolic dysfunction (EF 35%)  3.  Normal RV size and systolic function (EF  64%)  4.  Mild mitral regurgitation (regurgitant fraction 16%)  PET (02/09/23)  FDG uptake was observed. FDG uptake was diffuse. FDG uptake was present in the apical to mid anterior, septal and apex segment(s). LV perfusion is abnormal. Defect 1: There is a large defect with severe reduction in uptake present in the apical anterior, inferior and apex location(s). There is abnormal wall motion in the defect area. Consistent with infarction. Defect 2: There is a small defect with mild reduction in uptake present in the mid septal location(s). There is abnormal wall motion in the defect area. Consistent with infarction.   Left ventricular function is abnormal. There were multiple regional abnormalities. EF: 37 %. Akinesis of the apical septum, apical inferior segment, and apex. Akinesis of the basal to mid septum. End diastolic cavity size is mildly enlarged. End systolic cavity size is mildly enlarged.   Coronary calcium assessment not performed due to prior revascularization.   FDG uptake findings are consistent with active inflammation/sarcoidosis.  ASSESSMENT & PLAN:  Heart failure with reduced EF Etiology of AO:ZHYQMVHQ cardiomyopathy; hx of STEMI with PCI to the LAD, Lcx and LM; CMR with noncoronary distribution of LGE with active uptake via Cardiac PET.  NYHA class / AHA Stage:NYHAIIB Volume status & Diuretics: Euvolemic, lasix 40mg  PRN only Vasodilators:Entresto 97/103mg  BID Beta-Blocker:metoprolol increase to 50mg  daily.  ION:[id number] 50mg  Cardiometabolic:SGLT2i led to yeast infection. Will continue to hold as she is now on immunosuppressives. Devices therapies & Valvulopathies: minimal ectopy; will repeat FDG-PET if she continues to have significant uptake can proceed with primary prevention ICD.  Advanced therapies:not indicated  2. Cardiac  sarcoid - Vaccinations confirmed.  - PET from 02/09/23 w/ LVEF 37% - Currently on cellcept 1000mg  BID & prednisone 15mg  daily.  - Increase cellcept to target dose of 1500mg  BID; no significant GI side effects. CBC today.  - Will decrease prednisone to 10mg  daily, discontinue bactrim and plan for repeat PET to assess disease activity.     Fayette Gasner Advanced Heart Failure Mechanical Circulatory Support

## 2023-11-04 ENCOUNTER — Ambulatory Visit: Payer: Medicare Other | Admitting: Orthopaedic Surgery

## 2023-11-04 ENCOUNTER — Encounter: Payer: Self-pay | Admitting: Orthopaedic Surgery

## 2023-11-04 ENCOUNTER — Ambulatory Visit (HOSPITAL_COMMUNITY)
Admission: RE | Admit: 2023-11-04 | Discharge: 2023-11-04 | Disposition: A | Discharge status: Home / Self Care | Payer: Medicare Other | Source: Ambulatory Visit | Attending: Cardiology | Admitting: Cardiology

## 2023-11-04 ENCOUNTER — Encounter (HOSPITAL_COMMUNITY): Payer: Self-pay | Admitting: Cardiology

## 2023-11-04 VITALS — BP 132/72 | HR 82 | Wt 203.6 lb

## 2023-11-04 DIAGNOSIS — I5042 Chronic combined systolic (congestive) and diastolic (congestive) heart failure: Secondary | ICD-10-CM | POA: Insufficient documentation

## 2023-11-04 DIAGNOSIS — Z79624 Long term (current) use of inhibitors of nucleotide synthesis: Secondary | ICD-10-CM | POA: Diagnosis not present

## 2023-11-04 DIAGNOSIS — Z6837 Body mass index (BMI) 37.0-37.9, adult: Secondary | ICD-10-CM | POA: Diagnosis not present

## 2023-11-04 DIAGNOSIS — Z79899 Other long term (current) drug therapy: Secondary | ICD-10-CM | POA: Insufficient documentation

## 2023-11-04 DIAGNOSIS — Z7952 Long term (current) use of systemic steroids: Secondary | ICD-10-CM | POA: Insufficient documentation

## 2023-11-04 DIAGNOSIS — E669 Obesity, unspecified: Secondary | ICD-10-CM | POA: Diagnosis not present

## 2023-11-04 DIAGNOSIS — D8685 Sarcoid myocarditis: Secondary | ICD-10-CM | POA: Diagnosis not present

## 2023-11-04 DIAGNOSIS — Z955 Presence of coronary angioplasty implant and graft: Secondary | ICD-10-CM | POA: Diagnosis not present

## 2023-11-04 DIAGNOSIS — M25532 Pain in left wrist: Secondary | ICD-10-CM | POA: Diagnosis not present

## 2023-11-04 DIAGNOSIS — I252 Old myocardial infarction: Secondary | ICD-10-CM | POA: Insufficient documentation

## 2023-11-04 DIAGNOSIS — I255 Ischemic cardiomyopathy: Secondary | ICD-10-CM | POA: Diagnosis not present

## 2023-11-04 DIAGNOSIS — G4733 Obstructive sleep apnea (adult) (pediatric): Secondary | ICD-10-CM | POA: Diagnosis not present

## 2023-11-04 DIAGNOSIS — I110 Hypertensive heart disease with heart failure: Secondary | ICD-10-CM | POA: Diagnosis present

## 2023-11-04 DIAGNOSIS — Z8674 Personal history of sudden cardiac arrest: Secondary | ICD-10-CM | POA: Insufficient documentation

## 2023-11-04 DIAGNOSIS — I2510 Atherosclerotic heart disease of native coronary artery without angina pectoris: Secondary | ICD-10-CM | POA: Insufficient documentation

## 2023-11-04 DIAGNOSIS — I340 Nonrheumatic mitral (valve) insufficiency: Secondary | ICD-10-CM

## 2023-11-04 MED ORDER — METHYLPREDNISOLONE ACETATE 40 MG/ML IJ SUSP
40.0000 mg | INTRAMUSCULAR | Status: AC | PRN
Start: 2023-11-04 — End: 2023-11-04
  Administered 2023-11-04: 40 mg via INTRA_ARTICULAR

## 2023-11-04 MED ORDER — LIDOCAINE HCL 1 % IJ SOLN
1.0000 mL | INTRAMUSCULAR | Status: AC | PRN
Start: 2023-11-04 — End: 2023-11-04
  Administered 2023-11-04: 1 mL

## 2023-11-04 MED ORDER — PREDNISONE 5 MG PO TABS: 5.0000 mg | ORAL_TABLET | Freq: Every day | ORAL | 1 refills | Status: DC

## 2023-11-04 MED ORDER — BUPIVACAINE HCL 0.5 % IJ SOLN
1.0000 mL | INTRAMUSCULAR | Status: AC | PRN
Start: 2023-11-04 — End: 2023-11-04
  Administered 2023-11-04: 1 mL via INTRA_ARTICULAR

## 2023-11-04 MED ORDER — CARVEDILOL 12.5 MG PO TABS: 12.5000 mg | ORAL_TABLET | Freq: Two times a day (BID) | ORAL | 3 refills | Status: DC

## 2023-11-04 NOTE — Patient Instructions (Signed)
Medication Changes:  DECREASE PREDNISONE TO 5MG  DAILY   STOP  METOPROLOL   START: CARVEDILOL  12.5 MG TWICE DAILY   Testing/Procedures:  HIGH RESOLUTION CT OF THE CHEST- SOMEONE WILL CALL TO GET THIS SCHEDULED FOR YOU   Your physician has requested that you have an echocardiogram. Echocardiography is a painless test that uses sound waves to create images of your heart. It provides your doctor with information about the size and shape of your heart and how well your heart's chambers and valves are working. This procedure takes approximately one hour. There are no restrictions for this procedure. Please do NOT wear cologne, perfume, aftershave, or lotions (deodorant is allowed). Please arrive 15 minutes prior to your appointment time.  Please note: We ask at that you not bring children with you during ultrasound (echo/ vascular) testing. Due to room size and safety concerns, children are not allowed in the ultrasound rooms during exams. Our front office staff cannot provide observation of children in our lobby area while testing is being conducted. An adult accompanying a patient to their appointment will only be allowed in the ultrasound room at the discretion of the ultrasound technician under special circumstances. We apologize for any inconvenience.  Follow-Up in: 2 MONTHS PLEASE CALL OUR OFFICE AROUND THE END OF DECEMBER TO GET SCHEDULED FOR YOUR APPOINTMENT. PHONE NUMBER IS [phone] OPTION 2   At the [facility], you and your health needs are our priority. We have a designated team specialized in the treatment of Heart Failure. This Care Team includes your primary Heart Failure Specialized Cardiologist (physician), Advanced Practice Providers (APPs- Physician Assistants and Nurse Practitioners), and Pharmacist who all work together to provide you with the care you need, when you need it.   You may see any of the following providers on your designated Care Team at your  next follow up:  Dr. Arvilla Meres Dr. Marca Ancona Dr. Dorthula Nettles Dr. Theresia Bough Tonye Becket, NP Robbie Lis, [place] [facility] [place], [place] Brynda Peon, NP [place] Lee, NP Karle Plumber, PharmD   Please be sure to bring in all your medications bottles to every appointment.   Need to Contact us:  If you have any questions or concerns before your next appointment please send [place] a message through [place] or call our office at [phone].    TO LEAVE A MESSAGE FOR THE NURSE SELECT OPTION 2, PLEASE LEAVE A MESSAGE INCLUDING: YOUR NAME DATE OF BIRTH CALL BACK NUMBER REASON FOR CALL**this is important as we prioritize the call backs  YOU WILL RECEIVE A CALL BACK THE SAME DAY AS LONG AS YOU CALL BEFORE 4:00 PM

## 2023-11-04 NOTE — Progress Notes (Signed)
Office Visit Note   Patient: Marie Tanner           Date of Birth: 12/02/1955           MRN: [id number] Visit Date: 11/04/2023              Requested by: Quita Skye, PA-C [address] [place],  [place] [address] PCP: Quita Skye, PA-C   Assessment & Plan: Visit Diagnoses:  1. Pain in left wrist     Plan: Marie Tanner is a 67 year old female with recurrent left wrist osteoarthritis flare.  We repeated a cortisone injection today.  She tolerates well.  She will modify activity and rest as needed.  Follow-up as needed.  Follow-Up Instructions: No follow-ups on file.   Orders:  No orders of the defined types were placed in this encounter.  No orders of the defined types were placed in this encounter.     Procedures: Medium Joint Inj: L radiocarpal on 11/04/2023 8:14 AM Indications: pain Details: 25 G needle, dorsal approach Medications: 1 mL lidocaine 1 %; 1 mL bupivacaine 0.5 %; 40 mg methylPREDNISolone acetate 40 MG/ML Outcome: tolerated well, no immediate complications Patient was prepped and draped in the usual sterile fashion.       Clinical Data: No additional findings.   Subjective: Chief Complaint  Patient presents with   Left Wrist - Pain    HPI Marie Tanner comes in today for recurrent left wrist pain.  We saw her about 6 to 7 months ago for an osteoarthritis flare which did really well from cortisone injection.  Recently she aggravated it when she was lifting a heavy box at work and twisted it.  Since then it has been hurting and it has been swollen. Review of Systems  Constitutional: Negative.   HENT: Negative.    Eyes: Negative.   Respiratory: Negative.    Cardiovascular: Negative.   Endocrine: Negative.   Musculoskeletal:  Positive for arthralgias.  Neurological: Negative.   Hematological: Negative.   Psychiatric/Behavioral: Negative.    All other systems reviewed and are negative.    Objective: Vital Signs: There were no vitals taken for  this visit.  Physical Exam Vitals and nursing note reviewed.  Constitutional:      Appearance: She is well-developed.  HENT:     Head: Normocephalic and atraumatic.  Pulmonary:     Effort: Pulmonary effort is normal.  Abdominal:     Palpations: Abdomen is soft.  Musculoskeletal:     Cervical back: Neck supple.  Skin:    General: Skin is warm.     Capillary Refill: Capillary refill takes less than 2 seconds.  Neurological:     Mental Status: She is alert and oriented to person, place, and time.  Psychiatric:        Behavior: Behavior normal.        Thought Content: Thought content normal.        Judgment: Judgment normal.     Ortho Exam Exam of the left wrist shows moderate diffuse swelling on the dorsal aspect.  No signs of infection.  She has pain towards the radial side of the wrist with wrist range of motion. Specialty Comments:  No specialty comments available.  Imaging: No results found.   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   Stented coronary artery 03/17/2023   Coronary artery disease involving native coronary artery of native heart without angina pectoris 10/13/2022   Malignant neoplasm  metastatic to breast (HCC) 04/06/2022   Type 2 diabetes mellitus with hyperglycemia, without long-term current use of insulin (HCC) 04/06/2022   VT (ventricular tachycardia) (HCC) 04/06/2022   Class 2 severe obesity due to excess calories with serious comorbidity and body mass index (BMI) of 36.0 to 36.9 in adult [facility]) 04/06/2022   Right hip pain 04/03/2021   Palpitations 12/12/2020   Allergic urticaria 10/31/2019   Dermographia 10/31/2019   Other allergic rhinitis 10/31/2019   Current use of beta blocker 10/31/2019   Allergy to insect bites 10/31/2019   Hx of colonic polyp 01/03/2018   Angina, class III (HCC) 12/07/2017   Abnormal nuclear stress test 12/07/2017   Type II diabetes mellitus (HCC)    Sleep apnea     Renal disorder    History of gout    Heart murmur    GERD (gastroesophageal reflux disease)    CAD S/P percutaneous coronary angioplasty    Cancer (HCC)    Breast cancer (HCC)    Hyperlipidemia LDL goal <70 11/16/2016   History of breast cancer 01/24/2016   Abnormal finding on mammography 01/17/2016   Chest pain 06/06/2015   Diarrhea 06/06/2015   Essential hypertension 06/06/2015   Depression    Encounter for therapeutic drug monitoring 02/19/2015   Mitral regurgitation 01/30/2015   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) 01/30/2015   Chronic combined systolic and diastolic heart failure (HCC) 01/30/2015   Dyspnea 11/24/2014   Cardiomyopathy (HCC) 11/24/2014   DVT (deep venous thrombosis) (HCC) 11/24/2014   DM type 2 (diabetes mellitus, type 2) (HCC) 11/24/2014   HCAP (healthcare-associated pneumonia) 11/22/2014   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) 11/04/2014   Controlled type 2 diabetes mellitus without complication (HCC) 10/31/2014   High sodium levels 10/29/2014   Elevated WBC count 10/29/2014   Acute confusion due to medical condition 10/27/2014   Insomnia due to medical condition 10/27/2014   Acute respiratory failure (HCC) 10/24/2014   Injury of kidney 10/20/2014   Laceration of liver 10/20/2014   STEMI (ST elevation myocardial infarction) (HCC) 10/19/2014   Pulmonary edema 10/19/2014   Liver laceration 10/19/2014   History of blood transfusion 09/30/2014   Diabetes mellitus (HCC) 04/19/2013   Past Medical History:  Diagnosis Date   Breast cancer (HCC)    Cancer (HCC)    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   GERD (gastroesophageal reflux disease)    HCAP (healthcare-associated pneumonia) 11/22/2014   Heart murmur    High cholesterol     History of blood transfusion 09/2014   related to MI   History of gout    Hypertension    Liver laceration 10/19/2014   Palpitations 12/12/2020   Pulmonary edema 10/19/2014   Renal disorder    Renal disorder    Right hip pain 04/03/2021   Sleep apnea    "suppose to wear mask but I don't" (11/22/2014)   STEMI (ST elevation myocardial infarction) (HCC) 10/19/2014   Type II diabetes mellitus (HCC)    Urticaria     Family History  Problem Relation Age of Onset   Diabetes Mother    Hypertension Mother    COPD Mother    Heart disease Father    Heart attack Father    Alzheimer's disease Father    Diabetes Brother  Asthma Daughter    Stroke Neg Hx    Colon cancer Neg Hx    Stomach cancer Neg Hx    Pancreatic cancer Neg Hx    AAA (abdominal aortic aneurysm) Neg Hx     Past Surgical History:  Procedure Laterality Date   ABDOMINAL HYSTERECTOMY  ~ 2000   APPLICATION OF WOUND VAC  10/2014   "over my naval" (11/22/2014)   BREAST LUMPECTOMY Right 2017   BREAST LUMPECTOMY WITH AXILLARY LYMPH NODE BIOPSY     BREAST SURGERY     CESAREAN SECTION  1980; 1982   CORONARY ANGIOPLASTY WITH STENT PLACEMENT  09/2014   "multiple"/notes 11/22/2014   EXTRACORPOREAL CIRCULATION     Femerol artery repair Right 09/2014   pt insists it was a right femoral artery repair on 11/22/2014   LAPAROSCOPIC ABDOMINAL EXPLORATION     LEFT HEART CATH AND CORONARY ANGIOGRAPHY N/A 12/07/2017   Procedure: LEFT HEART CATH AND CORONARY ANGIOGRAPHY;  Surgeon: Marykay Lex, MD;  Location: [facility] INVASIVE CV LAB;  Service: Cardiovascular;  Laterality: N/A;   LEFT VENTRICULAR ASSIST DEVICE     pt is not aware of this hx on 11/22/2014   Liver laceration repair  09/2014   related to CPR/notes 11/22/2014   RIGHT/LEFT HEART CATH AND CORONARY ANGIOGRAPHY N/A 04/21/2021   Procedure: RIGHT/LEFT HEART CATH AND CORONARY ANGIOGRAPHY;  Surgeon: Yvonne Kendall, MD;  Location: [facility] INVASIVE CV LAB;  Service: Cardiovascular;   Laterality: N/A;   TEE WITHOUT CARDIOVERSION N/A 03/13/2021   Procedure: TRANSESOPHAGEAL ECHOCARDIOGRAM (TEE);  Surgeon: Parke Poisson, MD;  Location: [facility] ENDOSCOPY;  Service: Cardiology;  Laterality: N/A;   TRACHEOSTOMY  09/2014   "closed on it's own when they took it out"   TUBAL LIGATION  1982   Social History   Occupational History   Not on file  Tobacco Use   Smoking status: Never   Smokeless tobacco: Never  Vaping Use   Vaping status: Never Used  Substance and Sexual Activity   Alcohol use: Yes    Comment: occ   Drug use: No   Sexual activity: Not Currently

## 2023-11-06 ENCOUNTER — Other Ambulatory Visit (HOSPITAL_COMMUNITY): Payer: Self-pay | Admitting: Cardiology

## 2023-11-16 ENCOUNTER — Ambulatory Visit (INDEPENDENT_AMBULATORY_CARE_PROVIDER_SITE_OTHER): Payer: Medicare Other | Admitting: Podiatry

## 2023-11-16 DIAGNOSIS — L853 Xerosis cutis: Secondary | ICD-10-CM

## 2023-11-16 DIAGNOSIS — L84 Corns and callosities: Secondary | ICD-10-CM | POA: Diagnosis not present

## 2023-11-16 DIAGNOSIS — E1151 Type 2 diabetes mellitus with diabetic peripheral angiopathy without gangrene: Secondary | ICD-10-CM | POA: Diagnosis not present

## 2023-11-16 DIAGNOSIS — M79675 Pain in left toe(s): Secondary | ICD-10-CM

## 2023-11-16 DIAGNOSIS — B351 Tinea unguium: Secondary | ICD-10-CM | POA: Diagnosis not present

## 2023-11-16 DIAGNOSIS — M79674 Pain in right toe(s): Secondary | ICD-10-CM

## 2023-11-16 NOTE — Progress Notes (Unsigned)
Subjective:  Patient ID: Marie Tanner, female    DOB: 03/18/1956,  MRN: [id number]  Marie Tanner presents to clinic today for:  Chief Complaint  Patient presents with   Diabetes    Patient is here for routine [facility]   Patient notes nails are thick and elongated, causing pain in shoe gear when ambulating.  She also notes painful corns on the bilateral fifth toes.  She is concerned about rough skin on the plantar aspect of both feet.  She also notes some rough skin near the distal aspect of the second toe.  PCP is Quita Skye, PA-C.  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   GERD (gastroesophageal reflux disease)    HCAP (healthcare-associated pneumonia) 11/22/2014   Heart murmur    High cholesterol    History of blood transfusion 09/2014   related to MI   History of gout    Hypertension    Liver laceration 10/19/2014   Palpitations 12/12/2020   Pulmonary edema 10/19/2014   Renal disorder    Renal disorder    Right hip pain 04/03/2021   Sleep apnea    "suppose to wear mask but I don't" (11/22/2014)   STEMI (ST elevation myocardial infarction) (HCC) 10/19/2014   Type II diabetes mellitus (HCC)    Urticaria    Allergies  Allergen Reactions   Dilaudid [Hydromorphone Hcl] Anaphylaxis, Itching and Swelling   Epinephrine Anxiety and Other (See Comments)    Severe anxiety   Lactose Intolerance (Gi) Diarrhea   Jardiance [Empagliflozin]     YEAST INFECTION    Objective:  Marie Tanner is a pleasant 67 y.o. female in NAD. AAO x 3.  Vascular Examination: Patient has palpable DP pulse, absent PT pulse bilateral.  Delayed capillary refill bilateral toes.  Sparse digital hair bilateral.   Proximal to distal cooling WNL bilateral.    Dermatological Examination: Interspaces are clear with no open lesions noted bilateral.  Skin is shiny and atrophic bilateral.  Nails are 3-63mm thick, with yellowish/brown discoloration, subungual debris and distal onycholysis x10.  There is pain with compression of nails x10.  There are hyperkeratotic lesions noted dorsal lateral aspect of fifth toe PIPJ bilateral.  The skin on the plantar aspect of both feet as well as the distal second toes is hard, dry, and rough.  No fissures or open lesions are noted.  Musculoskeletal Examination: Adductovarus deformity bilateral fifth toe.  Patient qualifies for at-risk foot care because of diabetes with PVD.  Assessment/Plan: 1. Pain due to onychomycosis of toenails of both feet   2. Corns   3. Xerosis of skin   4. Type II diabetes mellitus with peripheral circulatory disorder (HCC)     Mycotic nails x10 were sharply debrided with sterile nail nippers and power debriding burr to decrease bulk and length.  Hyperkeratotic lesions x2 were shaved with #312 blade.  Recommended RevitaDerm 40 cream for the hard rough skin.  She can apply this nightly to the feet.  Patient is already scheduled for her next follow-up appointment in 3 months with Dr. Elmon Kirschner, DPM, FACFAS [facility]     [address]  [place], [place] [address]                Office [phone]  Fax [phone]

## 2023-11-17 ENCOUNTER — Other Ambulatory Visit: Payer: Self-pay | Admitting: Nurse Practitioner

## 2023-11-17 DIAGNOSIS — Z0000 Encounter for general adult medical examination without abnormal findings: Secondary | ICD-10-CM

## 2023-11-26 ENCOUNTER — Encounter (HOSPITAL_COMMUNITY): Payer: Self-pay

## 2023-11-30 ENCOUNTER — Ambulatory Visit (HOSPITAL_COMMUNITY)
Admission: RE | Admit: 2023-11-30 | Discharge: 2023-11-30 | Disposition: A | Discharge status: Home / Self Care | Payer: Medicare Other | Source: Ambulatory Visit | Attending: Cardiology | Admitting: Cardiology

## 2023-11-30 DIAGNOSIS — Z006 Encounter for examination for normal comparison and control in clinical research program: Secondary | ICD-10-CM

## 2023-11-30 DIAGNOSIS — D8685 Sarcoid myocarditis: Secondary | ICD-10-CM | POA: Diagnosis present

## 2023-11-30 DIAGNOSIS — I340 Nonrheumatic mitral (valve) insufficiency: Secondary | ICD-10-CM | POA: Insufficient documentation

## 2023-11-30 LAB — ECHOCARDIOGRAM COMPLETE
Area-P 1/2: 3.95 cm2
Calc EF: 38.3 %
MV VTI: 0.54 cm2
S' Lateral: 4.2 cm
Single Plane A2C EF: 51.5 %
Single Plane A4C EF: 23.2 %

## 2023-11-30 NOTE — Research (Signed)
 SITE: [id number]     Subject # [id number]    Subprotocol: A  Inclusion Criteria  Patients who meet all of the following criteria are eligible for enrollment as study participants:  Yes No  Age > 65 years old X   Eligible to wear Holter Study X    Exclusion Criteria  Patients who meet any of these criteria are not eligible for enrollment as study participants: Yes No  1. Receiving any mechanical (respiratory or circulatory) or renal support therapy at Screening or during Visit #1.  X  2.  Any other conditions that in the opinion of the investigators are likely to prevent compliance with the study protocol or pose a safety concern if the subject participates in the study.  X  3. Poor tolerance, namely susceptible to severe skin allergies from ECG adhesive patch application.  X   Protocol: REV H                                     Residential Zip [address] (First 3 digits ONLY)                                             [facility] Informed Consent   Subject Name: Marie Tanner  Subject met inclusion and exclusion criteria.  The informed consent form, study requirements and expectations were reviewed with the subject. Subject had opportunity to read consent and questions and concerns were addressed prior to the signing of the consent form.  The subject verbalized understanding of the trial requirements.  The subject agreed to participate in the [employer] EF ACT trial and signed the informed consent at 09:19 on 30-Nov-2023.  The informed consent was obtained prior to performance of any protocol-specific procedures for the subject.  A copy of the signed informed consent was given to the subject and a copy was placed in the subject's medical record.   Dorthea LITTIE Louder          Current Outpatient Medications:    acetaminophen  (TYLENOL ) 500 MG tablet, Take 1,000 mg by mouth every 6 (six) hours as needed for moderate pain., Disp: , Rfl:    aspirin  EC 81 MG tablet, Take 1 tablet (81 mg total) by mouth  daily., Disp: , Rfl:    blood glucose meter kit and supplies KIT, by Does not apply route daily as needed. Dispense based on patient and insurance preference. Use up to four times daily as directed. (FOR ICD-9 250.00, 250.01)., Disp: , Rfl:    calcium -vitamin D (OSCAL WITH D) 500-5 MG-MCG tablet, Take 1 tablet by mouth 2 (two) times daily., Disp: , Rfl:    carvedilol  (COREG ) 12.5 MG tablet, TAKE 1 TABLET BY MOUTH 2 TIMES DAILY., Disp: 180 tablet, Rfl: 3   cholecalciferol (VITAMIN D3) 25 MCG (1000 UNIT) tablet, Take 1,000 Units by mouth daily., Disp: , Rfl:    Dulaglutide  (TRULICITY ) 4.5 MG/0.5ML SOPN, Inject 4.5 mg as directed once a week., Disp: 2 mL, Rfl: 11   Evolocumab  (REPATHA  SURECLICK) 140 MG/ML SOAJ, Inject 140 mg into the skin every 14 (fourteen) days., Disp: 2 mL, Rfl: 5   furosemide  (LASIX ) 40 MG tablet, TAKE 1 TABLET ONCE PER WEEK WITH ADDITIONAL TABLET AS NEEDED FOR SHORTNESS OF BREATH OR WEIGHT GAIN OF 2 POUNDS IN 24 HOURS  OR 5 POUNDS IN 1 WEEK, Disp: 30 tablet, Rfl: 3   glucose blood test strip, Use as instructed. E11.5. To check blood glucose daily., Disp: 100 each, Rfl: 12   ketoconazole  (NIZORAL ) 2 % cream, Apply to both feet and between toes once daily for 6 weeks., Disp: 60 g, Rfl: 1   magnesium  oxide (MAG-OX) 400 MG tablet, TAKE 1 TABLET BY MOUTH EVERY DAY, Disp: 90 tablet, Rfl: 0   mycophenolate  (CELLCEPT ) 500 MG tablet, Take 3 tablets (1,500 mg total) by mouth 2 (two) times daily., Disp: 180 tablet, Rfl: 5   nitroGLYCERIN  (NITROSTAT ) 0.4 MG SL tablet, Place 1 tablet (0.4 mg total) under the tongue every 5 (five) minutes as needed for chest pain. max of 3 if no relief call 911, Disp: 25 tablet, Rfl: PRN   pantoprazole  (PROTONIX ) 40 MG tablet, Take 1 tablet (40 mg total) by mouth daily., Disp: 30 tablet, Rfl: 5   predniSONE  (DELTASONE ) 5 MG tablet, Take 1 tablet (5 mg total) by mouth daily with breakfast., Disp: 30 tablet, Rfl: 1   rosuvastatin  (CRESTOR ) 20 MG tablet, TAKE 1  TABLET BY MOUTH 3 TIMES PER WEEK, Disp: 40 tablet, Rfl: 5   sacubitril -valsartan  (ENTRESTO ) 97-103 MG, Take 1 tablet by mouth 2 (two) times daily., Disp: 180 tablet, Rfl: 3   spironolactone  (ALDACTONE ) 25 MG tablet, Take 1 tablet (25 mg total) by mouth daily., Disp: 30 tablet, Rfl: 11   ticagrelor  (BRILINTA ) 60 MG TABS tablet, Take 1 tablet (60 mg total) by mouth 2 (two) times daily., Disp: 180 tablet, Rfl: 3

## 2023-12-06 ENCOUNTER — Encounter (HOSPITAL_COMMUNITY): Payer: Self-pay | Admitting: *Deleted

## 2023-12-06 ENCOUNTER — Emergency Department (HOSPITAL_COMMUNITY): Payer: 59

## 2023-12-06 ENCOUNTER — Emergency Department (HOSPITAL_COMMUNITY)
Admission: EM | Admit: 2023-12-06 | Discharge: 2023-12-06 | Disposition: A | Discharge status: Home / Self Care | Payer: 59 | Attending: Emergency Medicine | Admitting: Emergency Medicine

## 2023-12-06 ENCOUNTER — Other Ambulatory Visit: Payer: Self-pay

## 2023-12-06 ENCOUNTER — Emergency Department (HOSPITAL_BASED_OUTPATIENT_CLINIC_OR_DEPARTMENT_OTHER): Payer: 59

## 2023-12-06 DIAGNOSIS — R0789 Other chest pain: Secondary | ICD-10-CM | POA: Insufficient documentation

## 2023-12-06 DIAGNOSIS — Z853 Personal history of malignant neoplasm of breast: Secondary | ICD-10-CM | POA: Insufficient documentation

## 2023-12-06 DIAGNOSIS — R2243 Localized swelling, mass and lump, lower limb, bilateral: Secondary | ICD-10-CM | POA: Diagnosis not present

## 2023-12-06 DIAGNOSIS — R079 Chest pain, unspecified: Secondary | ICD-10-CM | POA: Diagnosis present

## 2023-12-06 DIAGNOSIS — Z8674 Personal history of sudden cardiac arrest: Secondary | ICD-10-CM | POA: Diagnosis not present

## 2023-12-06 DIAGNOSIS — R0602 Shortness of breath: Secondary | ICD-10-CM | POA: Diagnosis not present

## 2023-12-06 DIAGNOSIS — Z7982 Long term (current) use of aspirin: Secondary | ICD-10-CM | POA: Diagnosis not present

## 2023-12-06 DIAGNOSIS — I252 Old myocardial infarction: Secondary | ICD-10-CM | POA: Insufficient documentation

## 2023-12-06 DIAGNOSIS — I10 Essential (primary) hypertension: Secondary | ICD-10-CM | POA: Diagnosis not present

## 2023-12-06 DIAGNOSIS — M7989 Other specified soft tissue disorders: Secondary | ICD-10-CM

## 2023-12-06 LAB — HEPATIC FUNCTION PANEL
ALT: 14 U/L (ref 0–44)
AST: 17 U/L (ref 15–41)
Albumin: 3.6 g/dL (ref 3.5–5.0)
Alkaline Phosphatase: 49 U/L (ref 38–126)
Bilirubin, Direct: 0.1 mg/dL (ref 0.0–0.2)
Indirect Bilirubin: 0.7 mg/dL (ref 0.3–0.9)
Total Bilirubin: 0.8 mg/dL (ref 0.0–1.2)
Total Protein: 7 g/dL (ref 6.5–8.1)

## 2023-12-06 LAB — CBC
HCT: 36.5 % (ref 36.0–46.0)
Hemoglobin: 11.6 g/dL — ABNORMAL LOW (ref 12.0–15.0)
MCH: 27.5 pg (ref 26.0–34.0)
MCHC: 31.8 g/dL (ref 30.0–36.0)
MCV: 86.5 fL (ref 80.0–100.0)
Platelets: 322 10*3/uL (ref 150–400)
RBC: 4.22 MIL/uL (ref 3.87–5.11)
RDW: 13.3 % (ref 11.5–15.5)
WBC: 3.6 10*3/uL — ABNORMAL LOW (ref 4.0–10.5)
nRBC: 0 % (ref 0.0–0.2)

## 2023-12-06 LAB — BASIC METABOLIC PANEL
Anion gap: 9 (ref 5–15)
BUN: 12 mg/dL (ref 8–23)
CO2: 24 mmol/L (ref 22–32)
Calcium: 9.7 mg/dL (ref 8.9–10.3)
Chloride: 104 mmol/L (ref 98–111)
Creatinine, Ser: 0.77 mg/dL (ref 0.44–1.00)
GFR, Estimated: >60 mL/min (ref >60)
Glucose, Bld: 124 mg/dL — ABNORMAL HIGH (ref 70–99)
Potassium: 4 mmol/L (ref 3.5–5.1)
Sodium: 137 mmol/L (ref 135–145)

## 2023-12-06 LAB — TROPONIN I (HIGH SENSITIVITY)
Troponin I (High Sensitivity): 8 ng/L (ref <18)
Troponin I (High Sensitivity): 9 ng/L (ref <18)

## 2023-12-06 LAB — LIPASE, BLOOD: Lipase: 35 U/L (ref 11–51)

## 2023-12-06 LAB — BRAIN NATRIURETIC PEPTIDE: B Natriuretic Peptide: 161.2 pg/mL — ABNORMAL HIGH (ref 0.0–100.0)

## 2023-12-06 MED ORDER — ASPIRIN 81 MG PO CHEW
324.0000 mg | CHEWABLE_TABLET | Freq: Once | ORAL | Status: AC
  Administered 2023-12-06: 324 mg via ORAL
  Filled 2023-12-06: qty 4

## 2023-12-06 MED ORDER — IOHEXOL 350 MG/ML SOLN
75.0000 mL | Freq: Once | INTRAVENOUS | Status: AC | PRN
  Administered 2023-12-06: 75 mL via INTRAVENOUS

## 2023-12-06 MED ORDER — ACETAMINOPHEN 325 MG PO TABS
650.0000 mg | ORAL_TABLET | Freq: Once | ORAL | Status: AC
Start: 2023-12-06 — End: 2023-12-06
  Administered 2023-12-06: 650 mg via ORAL
  Filled 2023-12-06: qty 2

## 2023-12-06 NOTE — Discharge Instructions (Signed)
 Your history, exam, workup today was overall reassuring.  We did not see evidence of an acute cardiac cause of your discomfort on Friday and today and your blood clot workup was negative.  We feel you are safe for discharge home.  Please stay hydrated and rest and follow-up with your cardiology team.  If any symptoms change or worsen acutely, please return to the nearest emergency department.

## 2023-12-06 NOTE — Progress Notes (Signed)
 Lower extremity venous duplex completed. Please see CV Procedures for preliminary results.  Shona Simpson, RVT 12/06/23 5:36 PM

## 2023-12-06 NOTE — ED Provider Triage Note (Signed)
 Emergency Medicine Provider Triage Evaluation Note  Marie Tanner , a 68 y.o. female  was evaluated in triage.  Pt complains of chest pain, midsternal, sharp and stabbing, was present when she awoke this morning, then seem to go away but came back.  She does have mild shortness of breath with this, she has no swelling of her legs but does have some leg pain today.  Complicating the patient's medical history is a history of breast cancer, she has been treated for this, she then had a heart attack in 2015 with an associated cardiac arrest that left her with 2 cardiac stents and a depressed ejection fraction.  She was eventually diagnosed with calcified mitral regurgitation and cardiac sarcoid for which she is taking medications and being handled medically.  She has had 2 heart catheterizations in the last 5 years neither of which showed any obstructive coronary disease it was maximum 40%.  Stents were wide open.  The patient is compliant with her medications.  She did take 1 baby aspirin  today, the chest pain is currently mild but present.  No fevers or chills.  She did have a DVT after her cardiac arrest in 2015 while she was in the hospital.  Review of Systems  Positive: Chest pain, leg pain Negative: Fevers vomiting or diarrhea  Physical Exam  There were no vitals taken for this visit. Gen:   Awake, no distress speaks in full sentences Resp:  Normal effort clear lungs, cardiac exam is unremarkable with normal heart sounds, mild murmur MSK:   Moves extremities without difficulty no edema Other:  Well-appearing awake and alert and follows commands, answers questions appropriately  Medical Decision Making  Medically screening exam initiated at 1:00 PM.  Appropriate orders placed.  Marie Tanner was informed that the remainder of the evaluation will be completed by another provider, this initial triage assessment does not replace that evaluation, and the importance of remaining in the ED until their  evaluation is complete.  Will need evaluation for cause of chest pain, given history of breast cancer and DVT will add D-dimer in addition to troponins.  Sounds pleuritic in nature but complicated medical history   Cleotilde Rogue, MD 12/06/23 1302

## 2023-12-06 NOTE — ED Triage Notes (Addendum)
 Pt states at 0600 woke up with sharp mid sternal chest pain and then it went away. Around 1000 got another sharper pain that is lingering and has some sob with it. PT has had mi with stent placement. Pt had 81mg  of ASA today. Pt states she took a ntg at home today and it did ease pain.

## 2023-12-06 NOTE — ED Provider Notes (Signed)
 [place] [facility] Provider Note   CSN: [id number] Arrival date & time: 12/06/23  1233     History  Chief Complaint  Patient presents with   Chest Pain    Marie Tanner is a 68 y.o. female.  The history is provided by the patient, medical records and a relative. No language interpreter was used.  Chest Pain Pain location:  Substernal area and L chest Pain quality: aching   Pain quality: not sharp   Pain radiates to:  Does not radiate Pain severity:  Severe Onset quality:  Gradual Duration:  4 days Timing:  Sporadic Progression:  Waxing and waning Chronicity:  New Relieved by:  Nothing Worsened by:  Nothing Ineffective treatments:  None tried Associated symptoms: back pain, headache, lower extremity edema and shortness of breath   Associated symptoms: no abdominal pain, no altered mental status, no cough, no diaphoresis, no fatigue, no fever, no nausea, no near-syncope, no palpitations and no vomiting   Risk factors: coronary artery disease, hypertension and prior DVT/PE        Home Medications Prior to Admission medications   Medication Sig Start Date End Date Taking? Authorizing Provider  acetaminophen  (TYLENOL ) 500 MG tablet Take 1,000 mg by mouth every 6 (six) hours as needed for moderate pain.   Yes [provider]  aspirin  EC 81 MG tablet Take 1 tablet (81 mg total) by mouth daily. 04/08/15  Yes Dominick Ned, MD  calcium -vitamin D (OSCAL WITH D) 500-5 MG-MCG tablet Take 1 tablet by mouth 2 (two) times daily. 04/29/23  Yes Sabharwal, Aditya, DO  carvedilol  (COREG ) 12.5 MG tablet TAKE 1 TABLET BY MOUTH 2 TIMES DAILY. 11/08/23  Yes Sabharwal, Aditya, DO  Dulaglutide  (TRULICITY ) 4.5 MG/0.5ML SOPN Inject 4.5 mg as directed once a week. 09/15/23  Yes Raford Riggs, MD  Evolocumab  (REPATHA  SURECLICK) 140 MG/ML SOAJ Inject 140 mg into the skin every 14 (fourteen) days. 01/25/23  Yes Vannie Reche RAMAN, NP  furosemide   (LASIX ) 40 MG tablet TAKE 1 TABLET ONCE PER WEEK WITH ADDITIONAL TABLET AS NEEDED FOR SHORTNESS OF BREATH OR WEIGHT GAIN OF 2 POUNDS IN 24 HOURS OR 5 POUNDS IN 1 WEEK 07/13/22  Yes Vannie Reche RAMAN, NP  ketoconazole  (NIZORAL ) 2 % cream Apply to both feet and between toes once daily for 6 weeks. 08/17/23  Yes Galaway, Delon CROME, DPM  magnesium  oxide (MAG-OX) 400 MG tablet TAKE 1 TABLET BY MOUTH EVERY DAY 04/04/20  Yes Stallings, Zoe A, MD  mycophenolate  (CELLCEPT ) 500 MG tablet Take 3 tablets (1,500 mg total) by mouth 2 (two) times daily. 07/08/23  Yes Sabharwal, Aditya, DO  nitroGLYCERIN  (NITROSTAT ) 0.4 MG SL tablet Place 1 tablet (0.4 mg total) under the tongue every 5 (five) minutes as needed for chest pain. max of 3 if no relief call 911 12/12/22  Yes Raford Riggs, MD  pantoprazole  (PROTONIX ) 40 MG tablet Take 1 tablet (40 mg total) by mouth daily. 04/29/23  Yes Sabharwal, Aditya, DO  predniSONE  (DELTASONE ) 5 MG tablet Take 1 tablet (5 mg total) by mouth daily with breakfast. 11/04/23  Yes Sabharwal, Aditya, DO  rosuvastatin  (CRESTOR ) 20 MG tablet TAKE 1 TABLET BY MOUTH 3 TIMES PER WEEK Patient taking differently: Take 1 tablet by mouth 3 times per week, Mon, Wed, and Fri 03/29/23  Yes Raford Riggs, MD  sacubitril -valsartan  (ENTRESTO ) 97-103 MG Take 1 tablet by mouth 2 (two) times daily. 04/29/23  Yes Sabharwal, Aditya, DO  spironolactone  (ALDACTONE ) 25 MG tablet  Take 1 tablet (25 mg total) by mouth daily. 04/29/23  Yes Sabharwal, Aditya, DO  ticagrelor  (BRILINTA ) 60 MG TABS tablet Take 1 tablet (60 mg total) by mouth 2 (two) times daily. 04/27/23  Yes Raford Riggs, MD  Vitamin D, Ergocalciferol, (DRISDOL) 1.25 MG (50000 UNIT) CAPS capsule Take 50,000 Units by mouth once a week. 12/02/23  Yes [provider]  blood glucose meter kit and supplies KIT by Does not apply route daily as needed. Dispense based on patient and insurance preference. Use up to four times daily as directed. (FOR  ICD-9 250.00, 250.01).    [provider]  cholecalciferol (VITAMIN D3) 25 MCG (1000 UNIT) tablet Take 1,000 Units by mouth daily. Patient not taking: Reported on 12/06/2023    [provider]  glucose blood test strip Use as instructed. E11.5. To check blood glucose daily. 05/02/18   Stallings, Zoe A, MD      Allergies    Dilaudid [hydromorphone hcl], Epinephrine , Lactose intolerance (gi), Jardiance  [empagliflozin ], and Shellfish allergy    Review of Systems   Review of Systems  Constitutional:  Negative for chills, diaphoresis, fatigue and fever.  HENT:  Negative for congestion.   Eyes:  Negative for visual disturbance.  Respiratory:  Positive for chest tightness and shortness of breath. Negative for cough and wheezing.   Cardiovascular:  Positive for chest pain and leg swelling. Negative for palpitations and near-syncope.  Gastrointestinal:  Negative for abdominal pain, constipation, diarrhea, nausea and vomiting.  Genitourinary:  Negative for dysuria and flank pain.  Musculoskeletal:  Positive for back pain. Negative for neck pain.  Skin:  Negative for rash.  Neurological:  Positive for headaches. Negative for light-headedness.  Psychiatric/Behavioral:  Negative for agitation and confusion.   All other systems reviewed and are negative.   Physical Exam Updated Vital Signs BP 128/72   Pulse 75   Temp 97.7 F (36.5 C) (Oral)   Resp 15   SpO2 100%  Physical Exam Vitals and nursing note reviewed.  Constitutional:      General: She is not in acute distress.    Appearance: She is well-developed. She is not ill-appearing, toxic-appearing or diaphoretic.  HENT:     Head: Normocephalic and atraumatic.  Eyes:     Conjunctiva/sclera: Conjunctivae normal.     Pupils: Pupils are equal, round, and reactive to light.  Cardiovascular:     Rate and Rhythm: Normal rate and regular rhythm.     Heart sounds: Normal heart sounds. No murmur heard. Pulmonary:     Effort:  Pulmonary effort is normal. No respiratory distress.     Breath sounds: Normal breath sounds. No wheezing, rhonchi or rales.  Chest:     Chest wall: No tenderness.  Abdominal:     Palpations: Abdomen is soft.     Tenderness: There is no abdominal tenderness.  Musculoskeletal:        General: No swelling.     Cervical back: Neck supple.     Right lower leg: No tenderness. Edema present.     Left lower leg: Edema present.  Skin:    General: Skin is warm and dry.     Capillary Refill: Capillary refill takes less than 2 seconds.     Coloration: Skin is not pale.  Neurological:     General: No focal deficit present.     Mental Status: She is alert.  Psychiatric:        Mood and Affect: Mood normal.     ED  Results / Procedures / Treatments   Labs (all labs ordered are listed, but only abnormal results are displayed) Labs Reviewed  BASIC METABOLIC PANEL - Abnormal; Notable for the following components:      Result Value   Glucose, Bld 124 (*)    All other components within normal limits  CBC - Abnormal; Notable for the following components:   WBC 3.6 (*)    Hemoglobin 11.6 (*)    All other components within normal limits  BRAIN NATRIURETIC PEPTIDE - Abnormal; Notable for the following components:   B Natriuretic Peptide 161.2 (*)    All other components within normal limits  LIPASE, BLOOD  HEPATIC FUNCTION PANEL  TROPONIN I (HIGH SENSITIVITY)  TROPONIN I (HIGH SENSITIVITY)    EKG EKG Interpretation Date/Time:  Monday December 06 2023 12:57:18 EST Ventricular Rate:  84 PR Interval:  170 QRS Duration:  78 QT Interval:  348 QTC Calculation: 411 R Axis:   58  Text Interpretation: Normal sinus rhythm Cannot rule out Inferior infarct , age undetermined Cannot rule out Anterior infarct , age undetermined T wave abnormality, consider lateral ischemia Abnormal ECG When compared with ECG of 04-Nov-2023 12:44, PREVIOUS ECG IS PRESENT no changes from 11/04/23 Confirmed by Cleotilde Rogue ([id number]) on 12/06/2023 1:02:58 PM  Radiology CT Angio Chest PE W and/or Wo Contrast Result Date: 12/06/2023 CLINICAL DATA:  Back and chest pain with shortness of breath, initial encounter EXAM: CT ANGIOGRAPHY CHEST WITH CONTRAST TECHNIQUE: Multidetector CT imaging of the chest was performed using the standard protocol during bolus administration of intravenous contrast. Multiplanar CT image reconstructions and MIPs were obtained to evaluate the vascular anatomy. RADIATION DOSE REDUCTION: This exam was performed according to the departmental dose-optimization program which includes automated exposure control, adjustment of the mA and/or kV according to patient size and/or use of iterative reconstruction technique. CONTRAST:  75mL OMNIPAQUE  IOHEXOL  350 MG/ML SOLN COMPARISON:  Chest x-ray from earlier in the same day, 08/31/2021. FINDINGS: Cardiovascular: Atherosclerotic calcifications of the thoracic aorta and its branches are noted. No aneurysmal dilatation or dissection is seen. No cardiac enlargement is noted. The pulmonary artery shows a normal branching pattern bilaterally. No filling defect to suggest pulmonary embolism is noted. Mediastinum/Nodes: Thoracic inlet is within normal limits. No hilar or mediastinal adenopathy is noted. The esophagus as visualized is within normal limits. Lungs/Pleura: Lungs are well aerated bilaterally. Scarring is noted in the anterior aspect of the right middle lobe stable from prior exam. Similar changes are noted in the right apex also stable from 2022. Upper Abdomen: Visualized upper abdomen shows dependent gallstones without complicating factors. Musculoskeletal: No chest wall abnormality. No acute or significant osseous findings. Review of the MIP images confirms the above findings. IMPRESSION: No evidence of pulmonary emboli. Scarring in the right lung stable from 2022. Cholelithiasis without complicating factors. Aortic Atherosclerosis (ICD10-I70.0). Electronically  Signed   By: Oneil Devonshire M.D.   On: 12/06/2023 19:12   VAS US  LOWER EXTREMITY VENOUS (DVT) (ONLY [facility] & [facility]) Result Date: 12/06/2023  Lower Venous DVT Study Patient Name:  NAYZETH ALTMAN  Date of Exam:   12/06/2023 Medical Rec #: [id number]        Accession #:    [id number] Date of Birth: 02-12-1956         Patient Gender: F Patient Age:   56 years Exam Location:  [facility] Procedure:      VAS US  LOWER EXTREMITY VENOUS (DVT) Referring Phys: LONNI SAKAI --------------------------------------------------------------------------------  Indications: Swelling, and  Edema.  Risk Factors: Cancer breast cancer DVT Hx. Comparison Study: No accessible studies Performing Technologist: Garnette Rockers  Examination Guidelines: A complete evaluation includes B-mode imaging, spectral Doppler, color Doppler, and power Doppler as needed of all accessible portions of each vessel. Bilateral testing is considered an integral part of a complete examination. Limited examinations for reoccurring indications may be performed as noted. The reflux portion of the exam is performed with the patient in reverse Trendelenburg.  +---------+---------------+---------+-----------+----------+--------------+ RIGHT    CompressibilityPhasicitySpontaneityPropertiesThrombus Aging +---------+---------------+---------+-----------+----------+--------------+ CFV      Full           Yes      Yes                                 +---------+---------------+---------+-----------+----------+--------------+ SFJ      Full                                                        +---------+---------------+---------+-----------+----------+--------------+ FV Prox  Full                                                        +---------+---------------+---------+-----------+----------+--------------+ FV Mid   Full                                                         +---------+---------------+---------+-----------+----------+--------------+ FV DistalFull                                                        +---------+---------------+---------+-----------+----------+--------------+ PFV      Full                                                        +---------+---------------+---------+-----------+----------+--------------+ POP      Full           Yes      Yes                                 +---------+---------------+---------+-----------+----------+--------------+ PTV      Full                                                        +---------+---------------+---------+-----------+----------+--------------+ PERO     Full                                                        +---------+---------------+---------+-----------+----------+--------------+   +---------+---------------+---------+-----------+----------+--------------+  LEFT     CompressibilityPhasicitySpontaneityPropertiesThrombus Aging +---------+---------------+---------+-----------+----------+--------------+ CFV      Full           Yes      Yes                                 +---------+---------------+---------+-----------+----------+--------------+ SFJ      Full                                                        +---------+---------------+---------+-----------+----------+--------------+ FV Prox  Full                                                        +---------+---------------+---------+-----------+----------+--------------+ FV Mid   Full                                                        +---------+---------------+---------+-----------+----------+--------------+ FV DistalFull                                                        +---------+---------------+---------+-----------+----------+--------------+ PFV      Full                                                         +---------+---------------+---------+-----------+----------+--------------+ POP      Full           Yes      Yes                                 +---------+---------------+---------+-----------+----------+--------------+ PTV      Full                                                        +---------+---------------+---------+-----------+----------+--------------+ PERO     Full                                                        +---------+---------------+---------+-----------+----------+--------------+     Summary: BILATERAL: - No evidence of deep vein thrombosis seen in the lower extremities, bilaterally. -No evidence of popliteal cyst, bilaterally.   *See table(s) above for measurements and observations.    Preliminary    DG Chest 2 View Result Date:  12/06/2023 CLINICAL DATA:  Acute chest pain this morning. Previous myocardial infarct. EXAM: CHEST - 2 VIEW COMPARISON:  08/31/2021 FINDINGS: The heart size and mediastinal contours are within normal limits. Stable mild right apical pleural-parenchymal scarring. The lungs are otherwise clear. The visualized skeletal structures are unremarkable. IMPRESSION: No active cardiopulmonary disease. Electronically Signed   By: Norleen DELENA Kil M.D.   On: 12/06/2023 14:02    Procedures Procedures    Medications Ordered in ED Medications  aspirin  chewable tablet 324 mg (324 mg Oral Given 12/06/23 1736)  acetaminophen  (TYLENOL ) tablet 650 mg (650 mg Oral Given 12/06/23 1736)  iohexol  (OMNIPAQUE ) 350 MG/ML injection 75 mL (75 mLs Intravenous Contrast Given 12/06/23 1838)    ED Course/ Medical Decision Making/ A&P                                 Medical Decision Making Amount and/or Complexity of Data Reviewed Labs: ordered. Radiology: ordered.  Risk OTC drugs. Prescription drug management.    Marie Tanner is a 68 y.o. 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  According to patient, 4 days ago on Friday, she had several hours of chest discomfort in her central chest.  It did not radiate at the time but she had some shortness of breath with it.  She reports it was not pleuritic or exertional but felt different than what she has felt in the past with her previous MI.  He said it then resolved and she did not have symptoms over the weekend.  She reports today she had 2 episodes of sharp discomfort in her chest and has had more persistent shortness of breath.  She does report her legs have been more edematous than normal and she has had some pain in her left leg.  She has not been on blood thinners other than aspirin  in quite some time.  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  She denies any long flights or drives or travel and denies any trauma.  She denies any nausea, vomiting, diaphoresis.  Denies any constipation, diarrhea, or urinary changes.  Denies congestion cough or infectious symptoms otherwise.  Reports some pain in the back with it and she is developed a mild headache now.  On exam, chest is nontender.  She does have a murmur.  Abdomen nontender.  Good pulse in extremities or leg slightly diminished bilaterally and symmetric.  Legs were not tender on my exam.  No focal neurologic deficits.  Pupils are symmetric and reactive with normal extraocular movements.  Lungs were clear without rhonchi or rales.  No wheezing.  Back nontender.  No rash seen.  EKG appears similar to prior.  Given the patient's leg pain, leg swelling, and sharp discomfort in her chest and back not on blood thinners and history of blood clots I am somewhat concerned need to rule out a thrombolic etiology as well as cardiac etiology given her MI history.  She had some workup starting in triage including a first troponin that was  negative.  EKG was similar to prior without STEMI.  She had not yet had the D-dimer drawn and we had a shared decision-making conversation and agreed to go to CT PE study given the higher concern.  Will give her some Tylenol  for headache and get other labs.  With the pain in her central abdomen that waxed and waned, we will also add on some hepatic function and lipase make sure there is not a problem coming from below her diaphragm causing the chest discomfort and back discomfort.  Anticipate reassessment after delta troponin workup to determine disposition.   10:19 PM Workup returned overall reassuring.  Troponin negative x 2 and BNP is 161 and not critically elevated compared to prior.  CT scan did not show pulmonary embolism and ultrasound was reassuring.  Rest of workup showed normal kidney and liver function.  She has proven really without significant symptoms for over 9-1/2 hours and she would like to go home.  Cardiology reviewed her EKG and workup and agree with plan for discharge home given this atypical sounding chest pain today.  She will call her cardiologist tomorrow and understood return precautions and follow-up instructions.  Patient discharged in good condition after reassuring workup.          Final Clinical Impression(s) / ED Diagnoses Final diagnoses:  Atypical chest pain  Nonspecific chest pain    Rx / DC Orders ED Discharge Orders     None       Clinical Impression: 1. Atypical chest pain   2. Nonspecific chest pain     Disposition: Discharge  Condition: Good  I have discussed the results, Dx and Tx plan with the pt(& family if present). He/she/they expressed understanding and agree(s) with the plan. Discharge instructions discussed at great length. Strict return precautions discussed and pt &/or family have verbalized understanding of the instructions. No further questions at time of discharge.    New Prescriptions   No medications on file     Follow Up: Delores Rojelio Caldron, NP [address] [place] [address] [phone]     your cardiologist     [facility] Emergency Department at [facility] [address] [place]  [address] [phone]       Merci Walthers, Lonni PARAS, MD 12/06/23 2221

## 2023-12-13 ENCOUNTER — Ambulatory Visit
Admission: RE | Admit: 2023-12-13 | Discharge: 2023-12-13 | Disposition: A | Discharge status: Home / Self Care | Payer: 59 | Source: Ambulatory Visit | Attending: Nurse Practitioner | Admitting: Nurse Practitioner

## 2023-12-13 ENCOUNTER — Encounter (HOSPITAL_BASED_OUTPATIENT_CLINIC_OR_DEPARTMENT_OTHER): Payer: Self-pay | Admitting: Cardiovascular Disease

## 2023-12-13 ENCOUNTER — Telehealth (HOSPITAL_BASED_OUTPATIENT_CLINIC_OR_DEPARTMENT_OTHER): Payer: Self-pay | Admitting: *Deleted

## 2023-12-13 DIAGNOSIS — Z0000 Encounter for general adult medical examination without abnormal findings: Secondary | ICD-10-CM

## 2023-12-13 NOTE — Telephone Encounter (Signed)
 Patient sent mychart message needing PA for Trulicity Will forward to PA team to initiate

## 2023-12-14 ENCOUNTER — Other Ambulatory Visit (HOSPITAL_COMMUNITY): Payer: Self-pay

## 2023-12-14 ENCOUNTER — Telehealth: Payer: Self-pay | Admitting: Pharmacy Technician

## 2023-12-14 NOTE — Telephone Encounter (Signed)
 Pharmacy Patient Advocate Encounter   Received notification from Pt Calls Messages that prior authorization for trulicity  is required/requested.   Insurance verification completed.   The patient is insured through [facility] .   Per test claim: PA required; PA submitted to above mentioned insurance via CoverMyMeds Key/confirmation #/EOC [id number] Status is pending

## 2023-12-15 ENCOUNTER — Other Ambulatory Visit (HOSPITAL_COMMUNITY): Payer: Self-pay

## 2023-12-15 NOTE — Telephone Encounter (Signed)
 Pharmacy Patient Advocate Encounter  Received notification from [employer] that Prior Authorization for trulicity  has been APPROVED from 12/14/23 to 11/29/24. Ran test claim, Copay is $0.00 one month. This test claim was processed through [facility]- copay amounts may vary at other pharmacies due to pharmacy/plan contracts, or as the patient moves through the different stages of their insurance plan.   PA #/Case ID/Reference #: [id number]

## 2023-12-16 ENCOUNTER — Telehealth (HOSPITAL_BASED_OUTPATIENT_CLINIC_OR_DEPARTMENT_OTHER): Payer: Self-pay | Admitting: *Deleted

## 2023-12-16 NOTE — Telephone Encounter (Signed)
Approved, see 1/14 telephone encounter  Mychar message to patient

## 2023-12-16 NOTE — Telephone Encounter (Signed)
Need prior authorization for Repatha Will forward to PA team to initiate

## 2023-12-17 ENCOUNTER — Other Ambulatory Visit (HOSPITAL_COMMUNITY): Payer: Self-pay

## 2023-12-17 ENCOUNTER — Telehealth: Payer: Self-pay | Admitting: Pharmacy Technician

## 2023-12-17 NOTE — Telephone Encounter (Signed)
Pharmacy Patient Advocate Encounter   Received notification from Pt Calls Messages that prior authorization for repatha is required/requested.   Insurance verification completed.   The patient is insured through [facility] .   Per test claim: PA required; PA submitted to above mentioned insurance via CoverMyMeds Key/confirmation #/EOC [id number] Status is pending

## 2023-12-17 NOTE — Telephone Encounter (Signed)
Pharmacy Patient Advocate Encounter  Received notification from [facility] that Prior Authorization for repatha has been APPROVED from 12/17/23 to 06/15/24   PA #/Case ID/Reference #: [id number]

## 2023-12-17 NOTE — Telephone Encounter (Signed)
Approved, see phone note 1/17

## 2023-12-20 ENCOUNTER — Telehealth (HOSPITAL_COMMUNITY): Payer: Self-pay | Admitting: *Deleted

## 2023-12-20 NOTE — Telephone Encounter (Addendum)
Attempted to call patient regarding upcoming cardiac PET appointment. Left message on voicemail with name and callback number  Oza Oberle RN Navigator Cardiac Imaging [facility] [phone] Office [phone] Cell  

## 2023-12-21 ENCOUNTER — Telehealth (HOSPITAL_COMMUNITY): Payer: Self-pay | Admitting: *Deleted

## 2023-12-21 NOTE — Progress Notes (Signed)
ADVANCED HEART FAILURE CLINIC NOTE  Primary Care: Estevan Oaks, NP Primary Cardiologist: Dr. Gasper Lloyd  CC: post ER evaluation HF follow up  HPI: Marie Tanner is a 68 y.o. female with coronary artery disease status post STEMI, hypertension, chronic systolic heart failure, obstructive sleep apnea not on CPAP, type 2 diabetes, history of DVT, breast cancer. It appears that Marie Tanner's cardiac history dates back to 2015 when she had a STEMI complicated by cardiac arrest at [facility]  She received 2 stents to the left main/LAD and circumflex.  During this time she had a prolonged hospitalization requiring tracheostomy; liver laceration during CPR which required surgical repair and also developed a DVT during this time. She underwent repeat left heart cath in January 2019 that demonstrated patent stents which were heavily calcified with other areas of nonobstructive CAD.  EF at that time was 35 to 45%.  With increasing GDMT she had improvement in EF by late 2019 to 40 to 50%.  Due to moderate to severe eccentric mitral regurgitation she had a TEE in 2022 and repeat right and left heart cath with structural evaluation in May 2022 that showed patent left main and LAD stents, normal filling pressures and a normal transmitral gradient.  She was deferred for medical management only.  At some point she had a cardiac MRI with noncoronary distribution of LGE followed by a cardiac PET that showed FDG uptake consistent with active inflammation/sarcoid.  Interval hx:  - Seen in ED 12/06/23 with CP. Pain improved after 1 dose of SL nitroglycerin. Troponin's negative x 2, ECG and labs reassuring. CT negative for PE. CP felt to be atypical/ non-cardiac in nature and she was discharged home.   Today she returns for post ED HF follow up. Overall feeling fine. She had another episode of CP several days after her ED evaluation, not as bad as previous episode. She did not feel she needed any  nitroglycerin, as episode passed quickly. She is SOB occasionally, more so with walking further distances on flat ground or walking uphill. Fatigue is worsening, feels occasional palpitations. Rare dizziness. Denies edema or PND/Orthopnea. Appetite ok. No fever or chills. She does not weight at home. Taking all medications. Takes Lasix about 1x/week. Works PT at [employer] in [place] area, working ~ 3 6-8 hr/shifts/week. Has her own catering business and food truck as well.  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   GERD (gastroesophageal reflux disease)    HCAP (healthcare-associated pneumonia) 11/22/2014   Heart murmur    High cholesterol    History of blood transfusion 09/2014   related to MI   History of gout    Hypertension    Liver laceration 10/19/2014   Palpitations 12/12/2020   Pulmonary edema 10/19/2014   Renal disorder    Renal disorder    Right hip pain 04/03/2021   Sleep apnea    "suppose to wear mask but I don't" (11/22/2014)   STEMI (ST elevation myocardial infarction) (HCC) 10/19/2014   Type II diabetes mellitus (HCC)    Urticaria    Current Outpatient Medications  Medication Sig Dispense Refill   acetaminophen (TYLENOL) 500 MG tablet Take 1,000 mg by mouth every 6 (six) hours as needed  for moderate pain.     aspirin EC 81 MG tablet Take 1 tablet (81 mg total) by mouth daily.     blood glucose meter kit and supplies KIT by Does not apply route daily as needed. Dispense based on patient and insurance preference. Use up to four times daily as directed. (FOR ICD-9 250.00, 250.01).     calcium-vitamin D (OSCAL WITH D) 500-5 MG-MCG tablet Take 1 tablet by mouth 2 (two) times daily.     carvedilol (COREG) 12.5 MG tablet  TAKE 1 TABLET BY MOUTH 2 TIMES DAILY. 180 tablet 3   cholecalciferol (VITAMIN D3) 25 MCG (1000 UNIT) tablet Take 1,000 Units by mouth once a week. Takes on Saturdays     Dulaglutide (TRULICITY) 4.5 MG/0.5ML SOPN Inject 4.5 mg as directed once a week. (Patient taking differently: Inject 4.5 mg as directed once a week. Sundays) 2 mL 11   Evolocumab (REPATHA SURECLICK) 140 MG/ML SOAJ Inject 140 mg into the skin every 14 (fourteen) days. 2 mL 5   furosemide (LASIX) 40 MG tablet TAKE 1 TABLET ONCE PER WEEK WITH ADDITIONAL TABLET AS NEEDED FOR SHORTNESS OF BREATH OR WEIGHT GAIN OF 2 POUNDS IN 24 HOURS OR 5 POUNDS IN 1 WEEK 30 tablet 3   glucose blood test strip Use as instructed. E11.5. To check blood glucose daily. 100 each 12   ketoconazole (NIZORAL) 2 % cream Apply to both feet and between toes once daily for 6 weeks. (Patient taking differently: Apply to both feet and between toes once daily for 6 weeks as needed) 60 g 1   magnesium oxide (MAG-OX) 400 MG tablet TAKE 1 TABLET BY MOUTH EVERY DAY 90 tablet 0   mycophenolate (CELLCEPT) 500 MG tablet Take 3 tablets (1,500 mg total) by mouth 2 (two) times daily. 180 tablet 5   nitroGLYCERIN (NITROSTAT) 0.4 MG SL tablet Place 1 tablet (0.4 mg total) under the tongue every 5 (five) minutes as needed for chest pain. max of 3 if no relief call 911 25 tablet PRN   pantoprazole (PROTONIX) 40 MG tablet Take 1 tablet (40 mg total) by mouth daily. 30 tablet 5   predniSONE (DELTASONE) 5 MG tablet Take 1 tablet (5 mg total) by mouth daily with breakfast. 30 tablet 1   rosuvastatin (CRESTOR) 20 MG tablet TAKE 1 TABLET BY MOUTH 3 TIMES PER WEEK (Patient taking differently: No sig reported) 40 tablet 5   sacubitril-valsartan (ENTRESTO) 97-103 MG Take 1 tablet by mouth 2 (two) times daily. 180 tablet 3   spironolactone (ALDACTONE) 25 MG tablet Take 1 tablet (25 mg total) by mouth daily. 30 tablet 11   ticagrelor (BRILINTA) 60 MG TABS tablet Take 1 tablet (60 mg total) by  mouth 2 (two) times daily. 180 tablet 3   No current facility-administered medications for this encounter.   Allergies  Allergen Reactions   Dilaudid [Hydromorphone Hcl] Anaphylaxis, Itching and Swelling   Epinephrine Anxiety and Other (See Comments)    Severe anxiety   Lactose Intolerance (Gi) Diarrhea   Jardiance [Empagliflozin]     YEAST INFECTION    Shellfish Allergy    Social History   Socioeconomic History   Marital status: Divorced    Spouse name: Not on file   Number of children: 2   Years of education: Not on file   Highest education level: Not on file  Occupational History   Not on file  Tobacco Use   Smoking status: Never   Smokeless tobacco: Never  Vaping  Use   Vaping status: Never Used  Substance and Sexual Activity   Alcohol use: Never   Drug use: No   Sexual activity: Not Currently  Other Topics Concern   Not on file  Social History Narrative   Not on file   Social Drivers of Health   Financial Resource Strain: Not on File (03/19/2022)   Received from [facility], [facility]    Financial Resource Strain: 0  Food Insecurity: Declined (04/06/2023)   Received from [facility], [place], [facility]   Food Insecurity    Food: 99  Transportation Needs: Not at Risk (04/06/2023)   Received from [place], [facility] Needs    Transportation: 1  Physical Activity: Not on File (03/19/2022)   Received from [place], [place]   Physical Activity    Physical Activity: 0  Stress: Not on File (03/19/2022)   Received from [facility], [place]   Stress    Stress: 0  Social Connections: Not on File (08/04/2023)   Received from [facility]   Social Connections    Connectedness: 0  Intimate Partner Violence: Not on file   Family History  Problem Relation Age of Onset   Diabetes Mother    Hypertension Mother    COPD Mother    Heart disease Father    Heart attack Father    Alzheimer's disease Father    Diabetes Brother    Asthma Daughter    Stroke Neg Hx    Colon  cancer Neg Hx    Stomach cancer Neg Hx    Pancreatic cancer Neg Hx    AAA (abdominal aortic aneurysm) Neg Hx    Wt Readings from Last 3 Encounters:  12/24/23 90 kg (198 lb 6.4 oz)  11/04/23 92.4 kg (203 lb 9.6 oz)  07/08/23 91.4 kg (201 lb 9.6 oz)   BP 108/60   Pulse 90   Wt 90 kg (198 lb 6.4 oz)   SpO2 100%   BMI 36.29 kg/m   ReDs reading: 33%, normal  PHYSICAL EXAM: General:  NAD. No resp difficulty, walked into clinic HEENT: Normal Neck: Supple. No JVD. Carotids 2+ bilat; no bruits. No lymphadenopathy or thryomegaly appreciated. Cor: PMI nondisplaced. Regular rate & rhythm. No rubs, gallops or murmurs. Lungs: Clear Abdomen: Soft, obese, nontender, nondistended. No hepatosplenomegaly. No bruits or masses. Good bowel sounds. Extremities: No cyanosis, clubbing, rash, edema Neuro: Alert & oriented x 3, cranial nerves grossly intact. Moves all 4 extremities w/o difficulty. Affect pleasant.  DATA REVIEW  ECG: 12/24/23: NSR not acute ST-T changes (personally reviewed)  ECHO: 05/07/22: LVEF 35%-40%, eccentric moderate - severe MR, normal RV function   CATH: 04/21/21:   Nonobstructive coronary artery disease with up to 40% stenosis involving the mid LAD and mid LCx.  Overall appearance is similar to prior catheterization in 2019. Patent LMCA/LAD stent. Normal left and right heart filling pressures. Mild pulmonary hypertension. Normal to supranormal Fick cardiac output/index. Normal transmitral gradient.  CMR:  11/03/22:  1. There is subendocardial LGE in apical anterior and mid to apical inferior walls, which could be consistent with LAD territory infarct as patient has known large wrap-around LAD, but the apex is spared, which argues against this. In addition, there is focal area of subendocardial LGE suggesting infarct in the basal anteroseptum, which is not in a coronary distribution. There is also mid inferoseptal midwall LGE, which is a nonischemic scar  pattern. 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 Cardiac sarcoid is  on the differential, as can cause both subendocardial and midwall LGE. Recommend cardiac PET to evaluate for sarcoid.  2. Mild LV hypertrophy, normal size, and moderate systolic dysfunction (EF 35%)  3.  Normal RV size and systolic function (EF 64%)  4.  Mild mitral regurgitation (regurgitant fraction 16%)  PET (02/09/23)  FDG uptake was observed. FDG uptake was diffuse. FDG uptake was present in the apical to mid anterior, septal and apex segment(s). LV perfusion is abnormal. Defect 1: There is a large defect with severe reduction in uptake present in the apical anterior, inferior and apex location(s). There is abnormal wall motion in the defect area. Consistent with infarction. Defect 2: There is a small defect with mild reduction in uptake present in the mid septal location(s). There is abnormal wall motion in the defect area. Consistent with infarction.   Left ventricular function is abnormal. There were multiple regional abnormalities. EF: 37 %. Akinesis of the apical septum, apical inferior segment, and apex. Akinesis of the basal to mid septum. End diastolic cavity size is mildly enlarged. End systolic cavity size is mildly enlarged.   Coronary calcium assessment not performed due to prior revascularization.   FDG uptake findings are consistent with active inflammation/sarcoidosis.  ASSESSMENT & PLAN:  Heart failure with reduced EF Etiology of ZO:XWRUEAVW cardiomyopathy; hx of STEMI with PCI to the LAD, Lcx and LM; CMR with noncoronary distribution of LGE with active uptake via Cardiac PET.  NYHA class / AHA Stage: NYHA IIb Volume status & Diuretics: Euvolemic, ReDs normal at 33% today. Continue Lasix 40 mg PRN  Vasodilators: Continue Entresto 97/103 mg bid. Beta-Blocker: Continue Coreg 12.5 mg bid.  MRA: Continue spironolactone 25 mg daily. Cardiometabolic:  SGLT2i led to yeast infection. Will continue to hold as she is now on immunosuppressives. Devices therapies & Valvulopathies: minimal ectopy; FDG PET schedule for 01/2024. Will repeat TTE soon. If LVEF remains reduced, plan on primary prevention ICD placement.  Advanced therapies: not indicated - BMET, BNP today.  2. Cardiac sarcoid - Vaccinations confirmed.  - PET from 02/09/23 w/ LVEF 37% - Currently on target dose of Cellcept 1500 mg bid & prednisone 5 mg daily.  - No significant GI side effects. CBC w/ diff today.  - Discussed with Dr. Gasper Lloyd, stop prednisone.  - HRCT has been arranged to assess for mediastinal lymphadenopathy.  - Cardiac PET arranged for 02/09/24  3. Obesity - Body mass index is 36.29 kg/m. - Attempting to increase activity; discussed nutrition.   4. OSA - sleep study pending; needs split night sleep study.  - Suspect contributing to her fatigue  5. Moderate to severe MR - She has follow up echo arranged to re-evaluate  6. Fatigue - Likely multifactorial - Check TSH, anemia panel and labs - Needs sleep study  Follow up next month with Dr. Gasper Lloyd, as scheduled. Will see if we can arrange Hi Res CT and echo to coincide with MD visit. PET has been moved out to 01/2024.  Prince Rome, FNP-BC 12/24/23

## 2023-12-21 NOTE — Telephone Encounter (Signed)
Reaching out to patient to offer assistance regarding upcoming cardiac imaging study; pt verbalizes understanding of appt date/time, parking situation and where to check in, pre-test NPO status; name and call back number provided for further questions should they arise  Larey Brick RN Navigator Cardiac Imaging [facility] [phone] office [phone] cell  Patient verbalized understanding of diet prep.

## 2023-12-22 ENCOUNTER — Encounter (HOSPITAL_COMMUNITY)
Admission: RE | Admit: 2023-12-22 | Discharge: 2023-12-22 | Disposition: A | Discharge status: Home / Self Care | Payer: 59 | Source: Ambulatory Visit | Attending: Cardiology | Admitting: Cardiology

## 2023-12-22 ENCOUNTER — Encounter (HOSPITAL_COMMUNITY): Payer: Self-pay | Admitting: Cardiology

## 2023-12-22 ENCOUNTER — Encounter (HOSPITAL_COMMUNITY): Payer: Self-pay

## 2023-12-24 ENCOUNTER — Encounter (HOSPITAL_COMMUNITY): Payer: Self-pay

## 2023-12-24 ENCOUNTER — Ambulatory Visit (HOSPITAL_COMMUNITY)
Admission: RE | Admit: 2023-12-24 | Discharge: 2023-12-24 | Disposition: A | Discharge status: Home / Self Care | Payer: PRIVATE HEALTH INSURANCE | Source: Ambulatory Visit | Attending: Family Medicine | Admitting: Family Medicine

## 2023-12-24 VITALS — BP 108/60 | HR 90 | Wt 198.4 lb

## 2023-12-24 DIAGNOSIS — Z955 Presence of coronary angioplasty implant and graft: Secondary | ICD-10-CM | POA: Insufficient documentation

## 2023-12-24 DIAGNOSIS — I5022 Chronic systolic (congestive) heart failure: Secondary | ICD-10-CM | POA: Insufficient documentation

## 2023-12-24 DIAGNOSIS — I110 Hypertensive heart disease with heart failure: Secondary | ICD-10-CM | POA: Diagnosis not present

## 2023-12-24 DIAGNOSIS — Z6836 Body mass index (BMI) 36.0-36.9, adult: Secondary | ICD-10-CM | POA: Insufficient documentation

## 2023-12-24 DIAGNOSIS — I5042 Chronic combined systolic (congestive) and diastolic (congestive) heart failure: Secondary | ICD-10-CM | POA: Diagnosis not present

## 2023-12-24 DIAGNOSIS — I2510 Atherosclerotic heart disease of native coronary artery without angina pectoris: Secondary | ICD-10-CM | POA: Insufficient documentation

## 2023-12-24 DIAGNOSIS — I255 Ischemic cardiomyopathy: Secondary | ICD-10-CM | POA: Diagnosis not present

## 2023-12-24 DIAGNOSIS — I252 Old myocardial infarction: Secondary | ICD-10-CM | POA: Diagnosis not present

## 2023-12-24 DIAGNOSIS — R5383 Other fatigue: Secondary | ICD-10-CM | POA: Diagnosis not present

## 2023-12-24 DIAGNOSIS — Z86718 Personal history of other venous thrombosis and embolism: Secondary | ICD-10-CM | POA: Insufficient documentation

## 2023-12-24 DIAGNOSIS — M25511 Pain in right shoulder: Secondary | ICD-10-CM | POA: Insufficient documentation

## 2023-12-24 DIAGNOSIS — E669 Obesity, unspecified: Secondary | ICD-10-CM | POA: Diagnosis not present

## 2023-12-24 DIAGNOSIS — I340 Nonrheumatic mitral (valve) insufficiency: Secondary | ICD-10-CM | POA: Diagnosis not present

## 2023-12-24 DIAGNOSIS — G4733 Obstructive sleep apnea (adult) (pediatric): Secondary | ICD-10-CM | POA: Insufficient documentation

## 2023-12-24 DIAGNOSIS — Z79899 Other long term (current) drug therapy: Secondary | ICD-10-CM | POA: Insufficient documentation

## 2023-12-24 DIAGNOSIS — Z79624 Long term (current) use of inhibitors of nucleotide synthesis: Secondary | ICD-10-CM | POA: Diagnosis not present

## 2023-12-24 DIAGNOSIS — E119 Type 2 diabetes mellitus without complications: Secondary | ICD-10-CM | POA: Diagnosis not present

## 2023-12-24 DIAGNOSIS — D8685 Sarcoid myocarditis: Secondary | ICD-10-CM | POA: Diagnosis not present

## 2023-12-24 DIAGNOSIS — Z7985 Long-term (current) use of injectable non-insulin antidiabetic drugs: Secondary | ICD-10-CM | POA: Insufficient documentation

## 2023-12-24 DIAGNOSIS — Z7952 Long term (current) use of systemic steroids: Secondary | ICD-10-CM | POA: Insufficient documentation

## 2023-12-24 DIAGNOSIS — R002 Palpitations: Secondary | ICD-10-CM | POA: Insufficient documentation

## 2023-12-24 DIAGNOSIS — R0602 Shortness of breath: Secondary | ICD-10-CM | POA: Diagnosis present

## 2023-12-24 DIAGNOSIS — Z8674 Personal history of sudden cardiac arrest: Secondary | ICD-10-CM | POA: Diagnosis not present

## 2023-12-24 LAB — CBC WITH DIFFERENTIAL/PLATELET
Abs Immature Granulocytes: 0.01 10*3/uL (ref 0.00–0.07)
Basophils Absolute: 0 10*3/uL (ref 0.0–0.1)
Basophils Relative: 0 %
Eosinophils Absolute: 0 10*3/uL (ref 0.0–0.5)
Eosinophils Relative: 0 %
HCT: 35.1 % — ABNORMAL LOW (ref 36.0–46.0)
Hemoglobin: 11.5 g/dL — ABNORMAL LOW (ref 12.0–15.0)
Immature Granulocytes: 0 %
Lymphocytes Relative: 17 %
Lymphs Abs: 0.9 10*3/uL (ref 0.7–4.0)
MCH: 27.7 pg (ref 26.0–34.0)
MCHC: 32.8 g/dL (ref 30.0–36.0)
MCV: 84.6 fL (ref 80.0–100.0)
Monocytes Absolute: 0.2 10*3/uL (ref 0.1–1.0)
Monocytes Relative: 4 %
Neutro Abs: 4.2 10*3/uL (ref 1.7–7.7)
Neutrophils Relative %: 79 %
Platelets: 308 10*3/uL (ref 150–400)
RBC: 4.15 MIL/uL (ref 3.87–5.11)
RDW: 13 % (ref 11.5–15.5)
WBC: 5.3 10*3/uL (ref 4.0–10.5)
nRBC: 0 % (ref 0.0–0.2)

## 2023-12-24 LAB — IRON AND TIBC
Iron: 107 ug/dL (ref 28–170)
Saturation Ratios: 29 % (ref 10.4–31.8)
TIBC: 372 ug/dL (ref 250–450)
UIBC: 265 ug/dL

## 2023-12-24 LAB — FERRITIN: Ferritin: 415 ng/mL — ABNORMAL HIGH (ref 11–307)

## 2023-12-24 LAB — COMPREHENSIVE METABOLIC PANEL
ALT: 15 U/L (ref 0–44)
AST: 22 U/L (ref 15–41)
Albumin: 3.8 g/dL (ref 3.5–5.0)
Alkaline Phosphatase: 50 U/L (ref 38–126)
Anion gap: 9 (ref 5–15)
BUN: 17 mg/dL (ref 8–23)
CO2: 22 mmol/L (ref 22–32)
Calcium: 9.4 mg/dL (ref 8.9–10.3)
Chloride: 100 mmol/L (ref 98–111)
Creatinine, Ser: 0.87 mg/dL (ref 0.44–1.00)
GFR, Estimated: >60 mL/min (ref >60)
Glucose, Bld: 184 mg/dL — ABNORMAL HIGH (ref 70–99)
Potassium: 4.8 mmol/L (ref 3.5–5.1)
Sodium: 131 mmol/L — ABNORMAL LOW (ref 135–145)
Total Bilirubin: 0.8 mg/dL (ref 0.0–1.2)
Total Protein: 7.2 g/dL (ref 6.5–8.1)

## 2023-12-24 LAB — BRAIN NATRIURETIC PEPTIDE: B Natriuretic Peptide: 105.6 pg/mL — ABNORMAL HIGH (ref 0.0–100.0)

## 2023-12-24 LAB — TSH: TSH: 0.835 u[IU]/mL (ref 0.350–4.500)

## 2023-12-24 NOTE — Progress Notes (Signed)
ReDS Vest / Clip - 12/24/23 1400       ReDS Vest / Clip   Station Marker B    Ruler Value 35.5    ReDS Value Range Low volume    ReDS Actual Value 33

## 2023-12-24 NOTE — Patient Instructions (Signed)
STOP Prednisone.  Labs done today, your results will be available in MyChart, we will contact you for abnormal readings.  Your physician has requested that you have an echocardiogram. Echocardiography is a painless test that uses sound waves to create images of your heart. It provides your doctor with information about the size and shape of your heart and how well your heart's chambers and valves are working. This procedure takes approximately one hour. There are no restrictions for this procedure. Please do NOT wear cologne, perfume, aftershave, or lotions (deodorant is allowed). Please arrive 15 minutes prior to your appointment time.  Please note: We ask at that you not bring children with you during ultrasound (echo/ vascular) testing. Due to room size and safety concerns, children are not allowed in the ultrasound rooms during exams. Our front office staff cannot provide observation of children in our lobby area while testing is being conducted. An adult accompanying a patient to their appointment will only be allowed in the ultrasound room at the discretion of the ultrasound technician under special circumstances. We apologize for any inconvenience.  Your physician recommends that you schedule a follow-up appointment as scheduled.  If you have any questions or concerns before your next appointment please send [place] a message through [place] or call our office at [phone].    TO LEAVE A MESSAGE FOR THE NURSE SELECT OPTION 2, PLEASE LEAVE A MESSAGE INCLUDING: YOUR NAME DATE OF BIRTH CALL BACK NUMBER REASON FOR CALL**this is important as we prioritize the call backs  YOU WILL RECEIVE A CALL BACK THE SAME DAY AS LONG AS YOU CALL BEFORE 4:00 PM  At the [facility], you and your health needs are our priority. As part of our continuing mission to provide you with exceptional heart care, we have created designated Provider Care Teams. These Care Teams include your primary  Cardiologist (physician) and Advanced Practice Providers (APPs- Physician Assistants and Nurse Practitioners) who all work together to provide you with the care you need, when you need it.   You may see any of the following providers on your designated Care Team at your next follow up: Dr Arvilla Meres Dr Marca Ancona Dr. Dorthula Nettles Dr. Clearnce Hasten Amy Filbert Schilder, NP Robbie Lis, [place] [facility] [place], [place] Brynda Peon, NP [place] Lee, NP Karle Plumber, PharmD   Please be sure to bring in all your medications bottles to every appointment.    Thank you for choosing [facility]

## 2024-01-04 ENCOUNTER — Other Ambulatory Visit (HOSPITAL_BASED_OUTPATIENT_CLINIC_OR_DEPARTMENT_OTHER): Payer: Self-pay | Admitting: Family

## 2024-01-04 DIAGNOSIS — E785 Hyperlipidemia, unspecified: Secondary | ICD-10-CM

## 2024-01-11 ENCOUNTER — Ambulatory Visit (HOSPITAL_BASED_OUTPATIENT_CLINIC_OR_DEPARTMENT_OTHER): Payer: 59 | Admitting: Cardiovascular Disease

## 2024-01-11 ENCOUNTER — Encounter (HOSPITAL_BASED_OUTPATIENT_CLINIC_OR_DEPARTMENT_OTHER): Payer: Self-pay | Admitting: Cardiovascular Disease

## 2024-01-11 ENCOUNTER — Other Ambulatory Visit (HOSPITAL_COMMUNITY): Payer: Self-pay | Admitting: Cardiology

## 2024-01-11 VITALS — BP 96/65 | HR 85 | Ht 62.0 in | Wt 197.0 lb

## 2024-01-11 DIAGNOSIS — D8685 Sarcoid myocarditis: Secondary | ICD-10-CM

## 2024-01-11 DIAGNOSIS — I472 Ventricular tachycardia, unspecified: Secondary | ICD-10-CM

## 2024-01-11 DIAGNOSIS — I2510 Atherosclerotic heart disease of native coronary artery without angina pectoris: Secondary | ICD-10-CM

## 2024-01-11 DIAGNOSIS — Z9861 Coronary angioplasty status: Secondary | ICD-10-CM | POA: Diagnosis not present

## 2024-01-11 DIAGNOSIS — I5042 Chronic combined systolic (congestive) and diastolic (congestive) heart failure: Secondary | ICD-10-CM | POA: Diagnosis not present

## 2024-01-11 DIAGNOSIS — G4733 Obstructive sleep apnea (adult) (pediatric): Secondary | ICD-10-CM

## 2024-01-11 MED ORDER — METOPROLOL SUCCINATE ER 50 MG PO TB24: 50.0000 mg | ORAL_TABLET | Freq: Every day | ORAL | 3 refills | Status: DC

## 2024-01-11 NOTE — Patient Instructions (Signed)
Medication Instructions:  STOP CARVEDILOL   START METOPROLOL SUCC 50 MG DAILY  IF BLOOD PRESSURE REMAINS LOW OK TO CUT IN HALF  *If you need a refill on your cardiac medications before your next appointment, please call your pharmacy*  Lab Work: NONE   Testing/Procedures: NONE   Follow-Up: At [facility], you and your health needs are our priority.  As part of our continuing mission to provide you with exceptional heart care, we have created designated Provider Care Teams.  These Care Teams include your primary Cardiologist (physician) and Advanced Practice Providers (APPs -  Physician Assistants and Nurse Practitioners) who all work together to provide you with the care you need, when you need it.  We recommend signing up for the patient portal called "MyChart".  Sign up information is provided on this After Visit Summary.  MyChart is used to connect with patients for Virtual Visits (Telemedicine).  Patients are able to view lab/test results, encounter notes, upcoming appointments, etc.  Non-urgent messages can be sent to your provider as well.   To learn more about what you can do with MyChart, go to [email or web address].    Your next appointment:   6 month(s)  Provider:   Chilton Si, MD or Gillian Shields, NP

## 2024-01-11 NOTE — Progress Notes (Signed)
Cardiology Office Note:  .   Date:  01/11/2024  ID:  Marie Tanner, DOB [birth year], MRN [id number] PCP: Estevan Oaks, Marie Tanner  [facility] Providers Cardiologist:  Chilton Si, MD    History of Present Illness: .    Marie Tanner is a 68 y.o. female  with CAD s/p STEMI, hypertension, chronic systolic and diastolic heart failure, OSA not on CPAP, diabetes, prior DVT, breast cancer, and mitral regurgitation who presents for follow up.  In 2015 she had a STEMI and cardiac arrest. She was cared for at [facility]. She ultimately received 2 DES (LM/LAD and LCx).  She had a prolonged hospitalization requiring tracheostomy.  Her liver was lacerated during CPR.  She required surgical repair after femoral catheterization.  She also developed a DVT in the hospital.  One week after discharge she developed pneumonia and was admitted at [facility].  She has been seen in the hospital twice for chest pain and ruled out for MI.  She had a negative Myoview 05/2015.  She also had negative CT scans for PE.  Marie Tanner also underwent surgery for breast cancer on 02/21/16.  She completed her radiation in September 2017.  She was referred for sleep study which was abnormal.  However, she declined using the CPAP device and is in the process of being fitted for an oral airway.  Marie Tanner had an echo 06/2016 that showed LVEF 50-55% with grade 1 diastolic dysfunction.     Marie Tanner reported an episode of chest pain that occurred while driving.  She was referred for [facility] 11/24/17 that revealed LVEF 36% with anterior, anteroseptal, apical inferior and apical lateral defects.  There was concern for infarct with peri-infarct ischemia in the inferior and apical walls.  She underwent LHC 11/2017 that revealed patent stents that were heavily calcified and there was a step down after the takeoff of the diagonal.  She had 40% RI, 40% proximal LCx and 20% prox LAD.  LVEF was 35-45% and LVEDP was elevated.   BiDil was added to her regimen for afterload reduction and poorly controlled hypertension.  She reported increased dyspnea on exertion and edema.  Lasix was increased for 2 days.  She was also referred for an echocardiogram 06/2018 that revealed LVEF 45 to 50% with hypokinesis of the mid to apical inferior and inferoseptal myocardium that was relatively unchanged from prior.  She was also started on spironolactone.  She had atypical chest pain and was referred for an ETT 10/2018 that was negative.  She achieved 6.2 METS on a Bruce protocol.     Marie Tanner reported episodes of dizziness.  Losartan was reduced to 12.5 mg she also had persistent episodes of chest discomfort that were atypical and occurred when lying down.  Thought to be secondary to GERD and her PPI was restarted.  Marie Tanner was struggling due to cost of medications.  She had been out of her BiDil, ticagrelor, and Praluent.  BiDil was switched to hydralazine and Imdur.  Her BP was improving.  It had been as high as 170-180s. She was started on patient assistance for ticagrelor and Praluent.     Marie Tanner BP was above goal, so hydralazine was increased. She called the office 01/19/2021 with chest pain and shortness of breath. She was referred for a nuclear stress test that revealed LVEF 39% with concern for infarct with peri-infarct ischemia. She had a heart monitor 2/22 with 10 beats of NSVT. She also had an echo  03/19/2021 with LVEF 34-40% and moderate to severe mitral regurgitation.  She had an echo 02/2021 that revealed LVEF 35 to 40% and very eccentric mitral regurgitation.  Her left atrium was normal in size.  She was referred for TEE that revealed LVEF 45 to 50% with a very eccentric, anteriorly directed mitral regurgitant jet.  It was thought to be due to a small flail P3 scallop.  There is no evidence of pulmonary flow reversal.  Based on PISA, the ERO was 0.17 cm with a regurgitant volume of 30 mL.  However this was thought to be  underestimated in the setting of anesthesia and an eccentric jet.  Overall it was thought to be moderate to severe.  She was started on both [place].  She noted an improvement in her breathing. But Jardiance was stopped due to dysuria. Entresto was increased. She was referred to the structural heart team for evaluation of her valve. She had a R/L Heart Cath 04/21/2021 that showed patent left main and LAD stent. She had non obstructive disease up to 40% in the mid lad and left circumflex. Her filling pressures were normal and her trans-mitral gradient was normal, therefore medical management was recommended.   Marie Tanner saw Dr. Lenell Antu on 03/2022 for her lower extremity edema.  He recommended compression and elevation of her legs for her lower extremity edema.  He did not feel that she needed any intervention for PAD given her lack of symptoms.  He felt that her noncompressible arteries were consistent with diabetes.  At the last visit she noted that her breathing was improving with exercise.  She noted some headaches and Imdur was discontinued.  She had a cardiac MRI 10/2022 that revealed late gadolinium enhancement in the apical anterior and mid to apical inferior walls which was consistent with an LAD infarct.  However the apex was spared which argues against it.  There are also focal areas of subendocardial LGE suggesting infarct in the basal anteroseptum which was not a coronary distribution.  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  Cardiac sarcoid could not be ruled out.  LVEF was 35% and she had mild mitral regurgitation with a regurgitant fraction of 16%.  Blood pressure was uncontrolled and spironolactone was increased/2024.   She had a follow-up cardiac PET scan which showed evidence of prior infarct as well as active FDG uptake consistent with active inflammation/sarcoidosis.  She was referred to the advanced heart failure clinic.   She saw Dr. Gasper Lloyd 03/2023 and was started on prednisone and CellCept.  She struggled with increased appetite on prednisone.  When she saw Marie Shields, Marie Tanner 05/2023 she advised her to take Lasix for volume overload.  She continues to work with advanced heart failure and has been able to increase CellCept and titrate down prednisone.  She has a repeat PET scan and a high-resolution chest CT ordered for March 2025.  Ms. Leite has been experiencing increased fatigue, which has worsened since switching metoprolol to carvedilol 2 months ago. While on prednisone and CellCept for sarcoidosis, there was no improvement in her fatigue. The fatigue is significant enough to impact her ability to work, leading her to reduce her workdays from four or five days a week to three days a week.  Her blood pressure has been running low at home, around 110 mmHg, which is lower than usual for her. She was switched from metoprolol to carvedilol in early December due to high  blood pressure, but she has noticed increased fatigue since this change. She takes carvedilol twice a day, along with Entresto and spironolactone 25 mg.  She is on a heart failure management regimen including Entresto and spironolactone. Her heart rate is noted to be high, and she experiences shortness of breath with exertion. She is being closely followed by the heart failure team.  She was supposed to have a PET scan, which was canceled due to weather and illness, and is now rescheduled for March 12. She is currently off prednisone and on CellCept, but reports no improvement in symptoms.  From a fluid standpoint, she reports not having significant issues but does notice swelling in her feet if she is on them for extended periods, particularly while working. She has been monitoring this and adjusting her activity accordingly.      ROS:  As per HPI  Studies Reviewed: .       Echo 10/2023:  1. Left ventricular ejection fraction, by estimation, is  35 to 40%. Left  ventricular ejection fraction by 2D MOD biplane is 38.3 %. The left  ventricle has moderately decreased function. The left ventricle  demonstrates global hypokinesis. Left  ventricular diastolic parameters are consistent with Grade I diastolic  dysfunction (impaired relaxation).   2. Right ventricular systolic function is mildly reduced. The right  ventricular size is normal. Tricuspid regurgitation signal is inadequate  for assessing PA pressure.   3. Jet is eccentric with significant splay. The mitral valve is abnormal.  Moderate mitral valve regurgitation. No evidence of mitral stenosis. The  mean mitral valve gradient is 1.6 mmHg. Severe mitral annular  calcification.   4. The aortic valve is tricuspid. Aortic valve regurgitation is trivial.  Aortic valve sclerosis/calcification is present, without any evidence of  aortic stenosis.   5. The inferior vena cava is normal in size with greater than 50%  respiratory variability, suggesting right atrial pressure of 3 mmHg.   Cardiac PET 02/09/23:   FDG uptake was observed. FDG uptake was diffuse. FDG uptake was present in the apical to mid anterior, septal and apex segment(s). LV perfusion is abnormal. Defect 1: There is a large defect with severe reduction in uptake present in the apical anterior, inferior and apex location(s). There is abnormal wall motion in the defect area. Consistent with infarction. Defect 2: There is a small defect with mild reduction in uptake present in the mid septal location(s). There is abnormal wall motion in the defect area. Consistent with infarction.   Left ventricular function is abnormal. There were multiple regional abnormalities. EF: 37 %. Akinesis of the apical septum, apical inferior segment, and apex. Akinesis of the basal to mid septum. End diastolic cavity size is mildly enlarged. End systolic cavity size is mildly enlarged.   Coronary calcium assessment not performed due to prior  revascularization.   FDG uptake findings are consistent with active inflammation/sarcoidosis.  Risk Assessment/Calculations:             Physical Exam:   VS:  BP 96/65 (BP Location: Left Arm, Patient Position: Sitting)   Pulse 85   Ht 5\' 2"  (1.575 m)   Wt 197 lb (89.4 kg)   SpO2 97%   BMI 36.03 kg/m  , BMI Body mass index is 36.03 kg/m. GENERAL:  Well appearing HEENT: Pupils equal round and reactive, fundi not visualized, oral mucosa unremarkable NECK:  No jugular venous distention, waveform within normal limits, carotid upstroke brisk and symmetric, no bruits, no thyromegaly LUNGS:  Clear to auscultation bilaterally HEART:  RRR.  PMI not displaced or sustained,S1 and S2 within normal limits, no S3, no S4, no clicks, no rubs, no murmurs ABD:  Flat, positive bowel sounds normal in frequency in pitch, no bruits, no rebound, no guarding, no midline pulsatile mass, no hepatomegaly, no splenomegaly EXT:  2 plus pulses throughout, no edema, no cyanosis no clubbing SKIN:  No rashes no nodules NEURO:  Cranial nerves II through XII grossly intact, motor grossly intact throughout PSYCH:  Cognitively intact, oriented to person place and time   ASSESSMENT AND PLAN: .    # Sarcoidosis with cardiac involement:  Patient reports increased fatigue, possibly related to medication regimen. Currently off prednisone and on CellCept. PET scan scheduled for March 12th. -Continue CellCept as prescribed. -Complete PET scan on March 12th.  # Hypotension Blood pressure readings have been consistently low, possibly contributing to fatigue. Recently switched from metoprolol to carvedilol for hypertension. -Discontinue carvedilol. -Start metoprolol succinate 50mg  daily. If blood pressure remains low and fatigue persists, reduce dose to 25mg  daily.  She was previously on 25mg  and BP was not controlled.   # HFrEF: Patient reports occasional shortness of breath with exertion. Echo scheduled for next  month. -Continue current heart failure management.  Beta blocker, Entresto, and spironolactone.  Intolerant of SGLT2 inhibitor.  -Complete scheduled echocardiogram.   # CAD s/p STEMI:  # Hyperlipidemia:  Lipids controlled on Repatha and statin.  Continue ticagrelor and ASA. She is on low dose ticagrelor due to high CV risk.   Follow-up Given the close follow-up with the heart failure team, next appointment with this provider scheduled for 6 months. However, patient is encouraged to schedule an appointment sooner if needed.      Signed, Chilton Si, MD

## 2024-01-12 ENCOUNTER — Encounter (HOSPITAL_COMMUNITY): Payer: Self-pay | Admitting: Cardiology

## 2024-01-19 ENCOUNTER — Ambulatory Visit (HOSPITAL_BASED_OUTPATIENT_CLINIC_OR_DEPARTMENT_OTHER)
Admission: RE | Admit: 2024-01-19 | Discharge: 2024-01-19 | Disposition: A | Discharge status: Home / Self Care | Payer: 59 | Source: Ambulatory Visit

## 2024-01-19 ENCOUNTER — Ambulatory Visit (HOSPITAL_COMMUNITY)
Admission: RE | Admit: 2024-01-19 | Discharge: 2024-01-19 | Disposition: A | Discharge status: Home / Self Care | Payer: 59 | Source: Ambulatory Visit | Attending: Cardiology | Admitting: Cardiology

## 2024-01-19 VITALS — BP 112/70 | HR 60 | Wt 195.6 lb

## 2024-01-19 DIAGNOSIS — I110 Hypertensive heart disease with heart failure: Secondary | ICD-10-CM | POA: Insufficient documentation

## 2024-01-19 DIAGNOSIS — I080 Rheumatic disorders of both mitral and aortic valves: Secondary | ICD-10-CM | POA: Diagnosis not present

## 2024-01-19 DIAGNOSIS — I2510 Atherosclerotic heart disease of native coronary artery without angina pectoris: Secondary | ICD-10-CM | POA: Diagnosis not present

## 2024-01-19 DIAGNOSIS — I5022 Chronic systolic (congestive) heart failure: Secondary | ICD-10-CM | POA: Insufficient documentation

## 2024-01-19 DIAGNOSIS — I5042 Chronic combined systolic (congestive) and diastolic (congestive) heart failure: Secondary | ICD-10-CM

## 2024-01-19 DIAGNOSIS — D849 Immunodeficiency, unspecified: Secondary | ICD-10-CM

## 2024-01-19 DIAGNOSIS — Z9861 Coronary angioplasty status: Secondary | ICD-10-CM

## 2024-01-19 DIAGNOSIS — E669 Obesity, unspecified: Secondary | ICD-10-CM

## 2024-01-19 DIAGNOSIS — G4733 Obstructive sleep apnea (adult) (pediatric): Secondary | ICD-10-CM

## 2024-01-19 DIAGNOSIS — D8685 Sarcoid myocarditis: Secondary | ICD-10-CM

## 2024-01-19 DIAGNOSIS — R001 Bradycardia, unspecified: Secondary | ICD-10-CM

## 2024-01-19 LAB — ECHOCARDIOGRAM COMPLETE
AR max vel: 2.2 cm2
AV Area VTI: 1.82 cm2
AV Area mean vel: 1.9 cm2
AV Mean grad: 3 mm[Hg]
AV Peak grad: 5.4 mm[Hg]
Ao pk vel: 1.16 m/s
Area-P 1/2: 3.87 cm2
Calc EF: 36.8 %
MV VTI: 2.08 cm2
P 1/2 time: 424 ms
S' Lateral: 2.9 cm
Single Plane A2C EF: 34.2 %
Single Plane A4C EF: 37.2 %

## 2024-01-19 MED ORDER — METOPROLOL SUCCINATE ER 25 MG PO TB24
25.0000 mg | ORAL_TABLET | Freq: Every day | ORAL | 3 refills | Status: DC
Start: 2024-01-19 — End: 2024-10-16

## 2024-01-19 NOTE — Patient Instructions (Signed)
DECREASE Toprol Xl to 25 mg daily.  Please follow up with our heart failure pharmacist in 1 month   Your physician recommends that you schedule a follow-up appointment in: 2 months (April) ** PLEASE CALL THE OFFICE IN MARCH TO ARRANGE YOUR FOLLOW UP APPOINTMENT.**  If you have any questions or concerns before your next appointment please send [place] a message through [place] or call our office at [phone].    TO LEAVE A MESSAGE FOR THE NURSE SELECT OPTION 2, PLEASE LEAVE A MESSAGE INCLUDING: YOUR NAME DATE OF BIRTH CALL BACK NUMBER REASON FOR CALL**this is important as we prioritize the call backs  YOU WILL RECEIVE A CALL BACK THE SAME DAY AS LONG AS YOU CALL BEFORE 4:00 PM  At the [facility], you and your health needs are our priority. As part of our continuing mission to provide you with exceptional heart care, we have created designated Provider Care Teams. These Care Teams include your primary Cardiologist (physician) and Advanced Practice Providers (APPs- Physician Assistants and Nurse Practitioners) who all work together to provide you with the care you need, when you need it.   You may see any of the following providers on your designated Care Team at your next follow up: Dr Arvilla Meres Dr Marca Ancona Dr. Dorthula Nettles Dr. Clearnce Hasten Amy Filbert Schilder, NP Robbie Lis, [place] [facility] [place], [place] Brynda Peon, NP [place] Lee, NP Karle Plumber, PharmD   Please be sure to bring in all your medications bottles to every appointment.    Thank you for choosing [facility]

## 2024-01-19 NOTE — Progress Notes (Incomplete)
ADVANCED HEART FAILURE CLINIC NOTE  Referring Physician: Estevan Oaks, NP  Primary Care: Estevan Oaks, NP Primary Cardiologist: Dr. Gasper Lloyd  HPI: Marie Tanner is a 68 y.o. female with coronary artery disease status post STEMI, hypertension, chronic systolic heart failure, obstructive sleep apnea not on CPAP, type 2 diabetes, history of DVT, breast cancer that presents today to establish care.  It appears that Marie Tanner's cardiac history dates back to 2015 when she had a STEMI complicated by cardiac arrest at [facility].  She received 2 stents to the left main/LAD and circumflex.  During this time she had a prolonged hospitalization requiring tracheostomy; liver laceration during CPR which required surgical repair and also developed a DVT during this time.  She underwent repeat left heart cath in January 2019 that demonstrated patent stents which were heavily calcified with other areas of nonobstructive CAD.  EF at that time was 35 to 45%.  With increasing GDMT she had improvement in EF by late 2019 to 40 to 50%.  Due to moderate to severe eccentric mitral regurgitation she had a TEE in 2022 and repeat right and left heart cath with structural evaluation in May 2022 that showed patent left main and LAD stents normal filling pressures and a normal transmitral gradient.  She was deferred for medical management only.  At some point she had a cardiac MRI with noncoronary distribution of LGE followed by a cardiac PET that showed FDG uptake consistent with active inflammation/sarcoid.  Interval hx:  -Doing fairly well from a heart failure standpoint; she does become fatigued.  -Switched to metoprolol succinate 50mg  daily by Dr. Duke Salvia due to fatigue and bradycardia - Remains bradycardic today.   Activity level/exercise tolerance:  NYHA II Orthopnea:  Sleeps on 2 pillows Paroxysmal noctural dyspnea:  no Chest pain/pressure:  no Orthostatic lightheadedness:  no Palpitations:   no Lower extremity edema:  no Presyncope/syncope:  no Cough:  no Current Outpatient Medications  Medication Sig Dispense Refill   acetaminophen (TYLENOL) 500 MG tablet Take 1,000 mg by mouth every 6 (six) hours as needed for moderate pain.     aspirin EC 81 MG tablet Take 1 tablet (81 mg total) by mouth daily.     blood glucose meter kit and supplies KIT by Does not apply route daily as needed. Dispense based on patient and insurance preference. Use up to four times daily as directed. (FOR ICD-9 250.00, 250.01).     calcium-vitamin D (OSCAL WITH D) 500-5 MG-MCG tablet Take 1 tablet by mouth 2 (two) times daily.     Dulaglutide (TRULICITY) 4.5 MG/0.5ML SOPN Inject 4.5 mg as directed once a week. 2 mL 11   Evolocumab (REPATHA SURECLICK) 140 MG/ML SOAJ INJECT 140 MG INTO THE SKIN EVERY 14 (FOURTEEN) DAYS. 6 mL 3   furosemide (LASIX) 40 MG tablet TAKE 1 TABLET ONCE PER WEEK WITH ADDITIONAL TABLET AS NEEDED FOR SHORTNESS OF BREATH OR WEIGHT GAIN OF 2 POUNDS IN 24 HOURS OR 5 POUNDS IN 1 WEEK 30 tablet 3   glucose blood test strip Use as instructed. E11.5. To check blood glucose daily. 100 each 12   ketoconazole (NIZORAL) 2 % cream Apply to both feet and between toes once daily for 6 weeks. (Patient taking differently: Apply to both feet and between toes once daily for 6 weeks as needed) 60 g 1   magnesium oxide (MAG-OX) 400 MG tablet TAKE 1 TABLET BY MOUTH EVERY DAY 90 tablet 0   metoprolol succinate (TOPROL-XL) 50  MG 24 hr tablet Take 1 tablet (50 mg total) by mouth daily. Take with or immediately following a meal. 90 tablet 3   mycophenolate (CELLCEPT) 500 MG tablet TAKE 3 TABLETS (1500 MG) BY MOUTH TWICE A DAY 540 tablet 1   nitroGLYCERIN (NITROSTAT) 0.4 MG SL tablet Place 1 tablet (0.4 mg total) under the tongue every 5 (five) minutes as needed for chest pain. max of 3 if no relief call 911 25 tablet PRN   pantoprazole (PROTONIX) 40 MG tablet Take 1 tablet (40 mg total) by mouth daily. 30 tablet 5    rosuvastatin (CRESTOR) 20 MG tablet TAKE 1 TABLET BY MOUTH 3 TIMES PER WEEK 40 tablet 5   sacubitril-valsartan (ENTRESTO) 97-103 MG Take 1 tablet by mouth 2 (two) times daily. 180 tablet 3   spironolactone (ALDACTONE) 25 MG tablet Take 1 tablet (25 mg total) by mouth daily. 30 tablet 11   ticagrelor (BRILINTA) 60 MG TABS tablet Take 1 tablet (60 mg total) by mouth 2 (two) times daily. 180 tablet 3   No current facility-administered medications for this encounter.   PHYSICAL EXAM: Vitals:   01/19/24 1051  BP: 112/70  Pulse: 60  SpO2: 100%   GENERAL: NAD Lungs- cta CARDIAC:  JVP: 5 cm          Normal rate with regular rhythm. No murmur.  Pulses 2+. no edema.  ABDOMEN: Soft, non-tender, non-distended.  EXTREMITIES: Warm and well perfused.  NEUROLOGIC: No obvious FND     DATA REVIEW  ECG: 03/26/23: NSR  As per my personal interpretation  ECHO: 05/07/22: LVEF 35%-40%, eccentric moderate - severe MR, normal RV function As per my personal interpretation  CATH: 04/21/21:   Nonobstructive coronary artery disease with up to 40% stenosis involving the mid LAD and mid LCx.  Overall appearance is similar to prior catheterization in 2019. Patent LMCA/LAD stent. Normal left and right heart filling pressures. Mild pulmonary hypertension. Normal to supranormal Fick cardiac output/index. Normal transmitral gradient.  CMR:  11/03/22:  1. There is subendocardial LGE in apical anterior and mid to apical inferior walls, which could be consistent with LAD territory infarct as patient has known large wrap-around LAD, but the apex is spared, which argues against this. In addition, there is focal area of subendocardial LGE suggesting infarct in the basal anteroseptum, which is not in a coronary distribution. There is also mid inferoseptal midwall LGE, which is a nonischemic scar pattern. 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 Cardiac  sarcoid is on the differential, as can cause both subendocardial and midwall LGE. Recommend cardiac PET to evaluate for sarcoid.  2. Mild LV hypertrophy, normal size, and moderate systolic dysfunction (EF 35%)  3.  Normal RV size and systolic function (EF 64%)  4.  Mild mitral regurgitation (regurgitant fraction 16%)  PET (02/09/23)  FDG uptake was observed. FDG uptake was diffuse. FDG uptake was present in the apical to mid anterior, septal and apex segment(s). LV perfusion is abnormal. Defect 1: There is a large defect with severe reduction in uptake present in the apical anterior, inferior and apex location(s). There is abnormal wall motion in the defect area. Consistent with infarction. Defect 2: There is a small defect with mild reduction in uptake present in the mid septal location(s). There is abnormal wall motion in the defect area. Consistent with infarction.   Left ventricular function is abnormal. There were multiple regional abnormalities. EF: 37 %. Akinesis of the apical septum, apical inferior  segment, and apex. Akinesis of the basal to mid septum. End diastolic cavity size is mildly enlarged. End systolic cavity size is mildly enlarged.   Coronary calcium assessment not performed due to prior revascularization.   FDG uptake findings are consistent with active inflammation/sarcoidosis.  ASSESSMENT & PLAN:  Heart failure with reduced EF Etiology of ZO:XWRUEAVW cardiomyopathy; hx of STEMI with PCI to the LAD, Lcx and LM; CMR with noncoronary distribution of LGE with active uptake via Cardiac PET.  NYHA class / AHA Stage:NYHAIIB Volume status & Diuretics: Euvolemic, lasix 40mg  PRN only Vasodilators:Entresto 97/103mg  BID Beta-Blocker: decrease metoprolol to 25mg  daily.  UJW:[id number] 50mg ;  Cardiometabolic:SGLT2i led to yeast infection. Will continue to hold as she is now on immunosuppressives. Devices therapies & Valvulopathies: FDG PET scheduled for 02/09/23.  Advanced  therapies:not indicated  2. Cardiac sarcoid - Vaccinations confirmed.  - PET from 02/09/23 w/ LVEF 37% - Currently on cellcept 1000mg  BID & prednisone 15mg  daily.  - Continue cellcept to target dose of 1500mg  BID; no significant GI side effects. CBC today.  - Now off of prednisone.  - Repeat ECG today with sinus bradycardia - HRCT in January to assess for mediastinal lymphadenopathy.   3. Obesity - Body mass index is 35.78 kg/m. - Discussed side effects of prednisone on weight and glucose.  - Attempting to increase activity; discussed nutrition.   4. OSA - sleep study pending; needs split night sleep study.  - Discussed rescheduling with clinic staff today; she has continued fatigue, snoring at night.   5. Moderate to severe MR - Not well visualized on TTE today, however, appears mild and at most moderate now.     Marie Tanner Advanced Heart Failure Mechanical Circulatory Support

## 2024-01-21 ENCOUNTER — Encounter (HOSPITAL_COMMUNITY): Payer: Self-pay

## 2024-01-21 ENCOUNTER — Ambulatory Visit (HOSPITAL_BASED_OUTPATIENT_CLINIC_OR_DEPARTMENT_OTHER): Payer: Medicaid Other | Admitting: Cardiovascular Disease

## 2024-02-01 ENCOUNTER — Encounter (HOSPITAL_COMMUNITY): Payer: Self-pay

## 2024-02-01 MED ORDER — PANTOPRAZOLE SODIUM 40 MG PO TBEC: 40.0000 mg | DELAYED_RELEASE_TABLET | Freq: Every day | ORAL | 5 refills | Status: DC

## 2024-02-03 ENCOUNTER — Other Ambulatory Visit: Payer: Self-pay | Admitting: Nurse Practitioner

## 2024-02-03 ENCOUNTER — Ambulatory Visit
Admission: RE | Admit: 2024-02-03 | Discharge: 2024-02-03 | Disposition: A | Discharge status: Home / Self Care | Source: Ambulatory Visit | Attending: Nurse Practitioner | Admitting: Nurse Practitioner

## 2024-02-03 DIAGNOSIS — M545 Low back pain, unspecified: Secondary | ICD-10-CM

## 2024-02-03 DIAGNOSIS — M25562 Pain in left knee: Secondary | ICD-10-CM

## 2024-02-07 ENCOUNTER — Telehealth (HOSPITAL_COMMUNITY): Payer: Self-pay | Admitting: *Deleted

## 2024-02-07 NOTE — Telephone Encounter (Signed)
 Reaching out to patient to offer assistance regarding upcoming cardiac imaging study; pt verbalizes understanding of appt date/time, parking situation and where to check in, pre-test NPO status; name and call back number provided for further questions should they arise  Larey Brick RN Navigator Cardiac Imaging [facility] [phone] office [phone] cell  Patient verbalized understanding of diet prep.

## 2024-02-09 ENCOUNTER — Encounter (HOSPITAL_COMMUNITY)
Admission: RE | Admit: 2024-02-09 | Discharge: 2024-02-09 | Disposition: A | Discharge status: Home / Self Care | Payer: Medicaid Other | Source: Ambulatory Visit | Attending: Cardiology | Admitting: Cardiology

## 2024-02-09 DIAGNOSIS — D8685 Sarcoid myocarditis: Secondary | ICD-10-CM | POA: Diagnosis present

## 2024-02-09 DIAGNOSIS — I5042 Chronic combined systolic (congestive) and diastolic (congestive) heart failure: Secondary | ICD-10-CM | POA: Diagnosis not present

## 2024-02-09 MED ORDER — RUBIDIUM RB82 GENERATOR (RUBYFILL)
22.1000 | PACK | Freq: Once | INTRAVENOUS | Status: AC
  Administered 2024-02-09: 22.1 via INTRAVENOUS

## 2024-02-09 MED ORDER — FLUDEOXYGLUCOSE F - 18 (FDG) INJECTION
8.9000 | Freq: Once | INTRAVENOUS | Status: AC
Start: 2024-02-09 — End: 2024-02-09
  Administered 2024-02-09: 8.9 via INTRAVENOUS

## 2024-02-10 LAB — NM PET CT MYOCARDIAL SARCOIDOSIS
Nuc Stress EF: 39 %
Rest Nuclear Isotope Dose: 22.1 mCi

## 2024-02-11 NOTE — Addendum Note (Signed)
 Encounter addended by: Howell Rucks, RDCS on: 02/11/2024 2:18 PM  Actions taken: Imaging Exam ended

## 2024-02-13 NOTE — Progress Notes (Incomplete)
 ***In Progress***    Advanced Heart Failure Clinic Note  Referring Physician: Quita Skye, PA-C  Primary Care: Quita Skye, PA-C Primary Cardiologist: Dorthula Nettles, DO  HPI:  Marie Tanner is a 68 y.o. female with coronary artery disease status post STEMI, hypertension, chronic systolic heart failure, obstructive sleep apnea not on CPAP, type 2 diabetes, history of DVT, breast cancer. It appears that Marie Tanner's cardiac history dates back to 2015 when she had a STEMI complicated by cardiac arrest at [facility].  She received 2 stents to the left main/LAD and circumflex.  During this time she had a prolonged hospitalization requiring tracheostomy; liver laceration during CPR which required surgical repair and also developed a DVT during this time.  She underwent repeat left heart cath in January 2019 that demonstrated patent stents which were heavily calcified with other areas of nonobstructive CAD.  EF at that time was 35 to 45%.  With increasing GDMT she had improvement in EF by late 2019 to 40 to 50%.  Due to moderate to severe eccentric mitral regurgitation she had a TEE in 2022 and repeat right and left heart cath with structural evaluation in May 2022 that showed patent left main and LAD stents normal filling pressures and a normal transmitral gradient.  She was deferred for medical management only.  At some point she had a cardiac MRI with noncoronary distribution of LGE followed by a cardiac PET that showed FDG uptake consistent with active inflammation/sarcoid.   Presented to HF Clinic for follow up 05/06/23 with Dr. Gasper Lloyd. Reported feeling very well from a functional standpoint; reported feeling a "little tired". 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 No chest pain, SOB, LE edema or lightheadedness. Had started prednisone 1 week ago. Had not started mycophenolate yet; awaiting delivery in the mail (gets from [facility]). She was last seen by  pharmacy 06/10/23 for follow-up of cardiac sarcoidosis taper. At her most recent appt with Dr. Gasper Lloyd on 01/19/24 patient reported feeling fatigued, but doing well. She was bradycardic with HR 60  - metoprolol succinate decreased to 25 mg daily. She has been continued on mycophenolate 1500 mg BID for sarcoid. She had a repeat cardiac PET on 02/09/24 showing diffuse FDG uptake, EF 39% - overall similar findings compared to 01/2023 PET.   Today she returns to HF clinic for pharmacist medication titration. At last visit with MD ***.   Med titration since reducing BB? F/u symptoms of fatigue and/or HR?  Last BP 112/70 - HR 60 Carvedilol 12.5 mg dispensed 01/30/24 for 90ds? Metop 25 dispensed 01/19/24 for 90ds Pred stopped in Feb 2025?  Cleda Daub last dispensed 10/23/23 for 90ds In Dec 2024 - switched metop 50 to carvedilol 12.5 mg BID for elevated BP - pt since endorses increased fatigue - but most recently - instructed to decrease metop???  Overall feeling ***. Dizziness, lightheadedness, fatigue:  Chest pain or palpitations:  How is your breathing?: *** SOB: Able to complete all ADLs. Activity level ***  Weight at home pounds. Takes furosemide/torsemide/bumex *** mg *** daily.  LEE PND/Orthopnea  Appetite *** Low-salt diet:   Physical Exam Cost/affordability of meds   HF Medications: Metoprolol succinate 25 mg daily (or carvedilol 12.5 mg BID??) Entresto 97/103 mg BID Spironolactone 25 mg daily Lasix 40 mg PRN  Has the patient been experiencing any side effects to the medications prescribed?  {YES NO:22349}  Does the patient have any problems obtaining medications due to transportation or finances?   {  YES EX:52841}  Understanding of regimen: {excellent/good/fair/poor:19665} Understanding of indications: {excellent/good/fair/poor:19665} Potential of compliance: {excellent/good/fair/poor:19665} Patient understands to avoid NSAIDs. Patient understands to avoid decongestants.     Pertinent Lab Values: 12/24/23: Serum creatinine 0.87, BUN 17, Potassium 4.8, Sodium 131  Vital Signs: Weight: *** (last clinic weight: 195.6 lbs) Blood pressure: ***  Heart rate: ***   Assessment/Plan: 1. Chronic systolic CHF (EF ***), due to ***. NYHA class *** symptoms. Etiology of LK:GMWNUUVO cardiomyopathy; hx of STEMI with PCI to the LAD, Lcx and LM; CMR with noncoronary distribution of LGE with active uptake via Cardiac PET.  NYHA class / AHA Stage:NYHAIIB Volume status & Diuretics: Euvolemic, lasix 40mg  PRN only Vasodilators:Entresto 97/103mg  BID Beta-Blocker: decrease metoprolol to 25mg  daily.  ZDG:[id number] 50mg ;  Cardiometabolic:SGLT2i led to yeast infection. Will continue to hold as she is now on immunosuppressives. Devices therapies & Valvulopathies: FDG PET scheduled for 02/09/23.  Advanced therapies:not indicated   2. Cardiac sarcoid - Vaccinations confirmed.  - PET from 02/09/23 w/ LVEF 37% - Currently on cellcept 1000mg  BID & prednisone 15mg  daily.  - Continue cellcept to target dose of 1500mg  BID; no significant GI side effects. CBC today.  - Now off of prednisone.  - Repeat ECG today with sinus bradycardia - HRCT in January to assess for mediastinal lymphadenopathy.    3. Obesity - Body mass index is 35.78 kg/m. - Discussed side effects of prednisone on weight and glucose.  - Attempting to increase activity; discussed nutrition.    4. OSA - sleep study pending; needs split night sleep study.  - Discussed rescheduling with clinic staff today; she has continued fatigue, snoring at night.    5. Moderate to severe MR - Not well visualized on TTE today, however, appears mild and at most moderate now.     - Basic disease state pathophysiology, medication indication, mechanism and side effects reviewed at length with patient and he verbalized understanding  Follow up ***  Nils Pyle, PharmD PGY1 Pharmacy Resident  Karle Plumber, PharmD, BCPS, BCCP,  CPP Heart Failure Clinic Pharmacist [phone]

## 2024-02-15 ENCOUNTER — Ambulatory Visit (HOSPITAL_COMMUNITY)
Admission: RE | Admit: 2024-02-15 | Discharge: 2024-02-15 | Disposition: A | Discharge status: Home / Self Care | Payer: 59 | Source: Ambulatory Visit | Attending: Cardiology | Admitting: Cardiology

## 2024-02-15 ENCOUNTER — Ambulatory Visit (INDEPENDENT_AMBULATORY_CARE_PROVIDER_SITE_OTHER): Payer: Medicare Other | Admitting: Podiatry

## 2024-02-15 ENCOUNTER — Encounter: Payer: Self-pay | Admitting: Podiatry

## 2024-02-15 VITALS — BP 124/68 | HR 80 | Wt 193.0 lb

## 2024-02-15 VITALS — Ht 62.0 in | Wt 195.6 lb

## 2024-02-15 DIAGNOSIS — M79674 Pain in right toe(s): Secondary | ICD-10-CM | POA: Diagnosis not present

## 2024-02-15 DIAGNOSIS — Q828 Other specified congenital malformations of skin: Secondary | ICD-10-CM | POA: Diagnosis not present

## 2024-02-15 DIAGNOSIS — I2510 Atherosclerotic heart disease of native coronary artery without angina pectoris: Secondary | ICD-10-CM | POA: Diagnosis not present

## 2024-02-15 DIAGNOSIS — E1142 Type 2 diabetes mellitus with diabetic polyneuropathy: Secondary | ICD-10-CM

## 2024-02-15 DIAGNOSIS — D8685 Sarcoid myocarditis: Secondary | ICD-10-CM | POA: Diagnosis present

## 2024-02-15 DIAGNOSIS — B351 Tinea unguium: Secondary | ICD-10-CM

## 2024-02-15 DIAGNOSIS — I5042 Chronic combined systolic (congestive) and diastolic (congestive) heart failure: Secondary | ICD-10-CM | POA: Diagnosis not present

## 2024-02-15 DIAGNOSIS — M79675 Pain in left toe(s): Secondary | ICD-10-CM

## 2024-02-15 MED ORDER — SPIRONOLACTONE 25 MG PO TABS: 25.0000 mg | ORAL_TABLET | Freq: Every day | ORAL | 11 refills | Status: AC

## 2024-02-15 MED ORDER — PREDNISONE 20 MG PO TABS: 20.0000 mg | ORAL_TABLET | Freq: Every day | ORAL | 3 refills | Status: DC

## 2024-02-15 MED ORDER — SULFAMETHOXAZOLE-TRIMETHOPRIM 800-160 MG PO TABS: 1.0000 | ORAL_TABLET | ORAL | 3 refills | Status: DC

## 2024-02-15 MED ORDER — ROSUVASTATIN CALCIUM 20 MG PO TABS: ORAL_TABLET | ORAL | 5 refills | Status: AC

## 2024-02-15 NOTE — Patient Instructions (Addendum)
 It was a pleasure seeing you today!  MEDICATIONS: -We are changing your medications today -Start Prednisone 20 mg daily -Start Bactrim (sulfamethoxazole-trimethoprim) 1 tablet on Mondays, Wednesdays, and Fridays -Continue taking your vitamin D-calcium tablet twice daily, and your pantoprazole, while you are taking prednisone -Continue all other medications -Call if you have questions about your medications.  NEXT APPOINTMENT: Return to clinic in 3 mo with Dr. Gasper Lloyd. We will reach out to you to schedule a repeat PET scan  In general, to take care of your heart failure: -Limit your fluid intake to 2 Liters (half-gallon) per day.   -Limit your salt intake to ideally 2-3 grams (2000-3000 mg) per day. -Weigh yourself daily and record, and bring that "weight diary" to your next appointment.  (Weight gain of 2-3 pounds in 1 day typically means fluid weight.) -The medications for your heart are to help your heart and help you live longer.   -Please contact us before stopping any of your heart medications.  Call the clinic at [phone] with questions or to reschedule future appointments.

## 2024-02-15 NOTE — Progress Notes (Signed)
 Advanced Heart Failure Clinic Note   Referring Physician: Quita Skye, PA-C  Primary Care: Quita Skye, PA-C Primary Cardiologist: Dorthula Nettles, DO  HPI:  Marie Tanner is a 68 y.o. female with coronary artery disease status post STEMI, hypertension, chronic systolic heart failure, obstructive sleep apnea not on CPAP, type 2 diabetes, history of DVT, breast cancer. It appears that Marie Tanner's cardiac history dates back to 2015 when she had a STEMI complicated by cardiac arrest at [facility].  She received 2 stents to the left main/LAD and circumflex.  During this time she had a prolonged hospitalization requiring tracheostomy; liver laceration during CPR which required surgical repair and also developed a DVT during this time.  She underwent repeat left heart cath in January 2019 that demonstrated patent stents which were heavily calcified with other areas of nonobstructive CAD.  EF at that time was 35 to 45%.  With increasing GDMT she had improvement in EF by late 2019 to 40 to 50%.  Due to moderate to severe eccentric mitral regurgitation she had a TEE in 2022 and repeat right and left heart cath with structural evaluation in May 2022 that showed patent left main and LAD stents normal filling pressures and a normal transmitral gradient.  She was deferred for medical management only.  At some point she had a cardiac MRI with noncoronary distribution of LGE followed by a cardiac PET that showed FDG uptake consistent with active inflammation/sarcoid.   Presented to HF Clinic for follow up 05/06/23 with Dr. Gasper Lloyd. Reported feeling very well from a functional standpoint; reported feeling a "little tired". 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 No chest pain, SOB, LE edema or lightheadedness. Had started prednisone 1 week ago. Had not started mycophenolate yet; awaiting delivery in the mail (gets from [facility]). She was last seen by pharmacy 06/10/23 for  follow-up of cardiac sarcoidosis taper. At her most recent appt with Dr. Gasper Lloyd on 01/19/24 patient reported feeling fatigued, but doing well. She was bradycardic with HR 60  - metoprolol succinate decreased to 25 mg daily. She has been continued on mycophenolate 1500 mg BID for sarcoid.   She had a repeat cardiac PET on 02/09/24 showing diffuse FDG uptake, EF 39% - overall similar findings compared to 01/2023 PET.   Today she returns to HF clinic for pharmacist medication titration. At last visit with MD, metoprolol succinate was decreased to 25 mg daily. Patient reports that she is feeling well today. She denies dizziness, lightheadedness, or excess fatigue, Denies CP or palpitations. Denies SOB  - she does not notice any activity limitation, but also has not attempted to exert herself. Denies PND, orthopnea. She sleeps on 2 pillows. She takes her weight daily at home - stable around 190 lbs. She reports that she had an increase in her weight while taking prednisone, but this has started to come off. She has not needed to take Lasix for extra fluid (2-3 lbs weight gain in a day), but she does take 40 mg  ~1 time/week on Saturdays. No LEE on exam, but reports she has occasional swelling in her feet. Her appetite is good. She has been trying to cook more at home, buying fresh vegetables, reports she is monitoring her sodium intake. She reports she feels much better since stopping carvedilol and decreasing metoprolol. She reports she is running low on spironolactone and rosuvastatin.   HF Medications: Metoprolol succinate 25 mg daily  Entresto 97/103 mg BID  Spironolactone 25 mg daily  Lasix 40 mg PRN - taking ~once/week on Saturdays  Has the patient been experiencing any side effects to the medications prescribed? No  Does the patient have any problems obtaining medications due to transportation or finances? No; [facility] Medicare/Medicaid  Understanding of regimen: good Understanding of indications:  good Potential of compliance: good Patient understands to avoid NSAIDs. Patient understands to avoid decongestants.    Pertinent Lab Values: 12/24/23: Serum creatinine 0.87, BUN 17, Potassium 4.8, Sodium 131  Vital Signs: Weight: 193.0 lbs (last clinic weight: 195.6 lbs) Blood pressure: 124/68 mmHg  Heart rate: 80   Assessment/Plan: 1. Chronic systolic CHF (EF 87% on cardiac PET March 2025, previously 35-45%), due to ischemic cardiomyopathy (hx of STEMI with PCI to LAD, Lcx, LM) and cardiac sarcoidosis (noncoronary distribution of LGE with active uptake via cardiac PET). NYHA class II symptoms. Euvolemic on exam today. Appears adherent to current regimen without issues since de-escalation in beta-blocker- appropriate to continue at this time.  - Continue Lasix 40 mg daily PRN for fluid - Continue metoprolol succinate 25 mg daily - Continue Entresto 97/103 mg BID - Continue spironolactone 25 mg daily. Sent refill today.  - Not on SGLT2i due to yeast infection. Will continue to hold given current tx with immunosuppressives - Educated to continue taking daily weights to monitor for hypervolemia and to guide Lasix dosing, especially with plan to restart prednisone detailed below.    2. Cardiac sarcoid - PET from 02/09/23 w/ LVEF 37%. Repeat PET 02/09/24 w/ LVEF 39%, degree of inflammation unchanged. Discussed plan with Dr. Gasper Lloyd. Will restart prednisone and repeat cardiac PET in ~8 weeks.  - Continue mycophenolate 1500 mg (3 tabs) twice daily - Restart prednisone at 20 mg daily - Restart sulfamethoxazole-trimethoprim 800-160 mg one tab three times per week on MWF while taking prednisone - Continue pantoprazole 40 mg daily while on prednisone - Continue calcium-vitamin D 500 mg - 5 mcg twice daily while on prednisone - Placed orders for repeat PET scan in ~8 weeks. Patient informed to look out for call to schedule.    3. Obesity - Body mass index is 35.78 kg/m. - Discussed side effects  of prednisone on weight and glucose.  - Attempting to increase activity; discussed nutrition.    4. OSA - sleep study pending; needs split night sleep study.  - Discussed rescheduling with clinic staff; she has continued fatigue, snoring at night.    5. Moderate to severe MR - Not well visualized on TTE, however, appears mild and at most moderate now.  6. CAD - Hx of STEMI with PCI to LAD, Lcx, LM. Followed by general cardiology. - Continue rosuvastatin 20 mg three times weekly due to tolerability. Sent refill today.  - Continue aspirin 81 mg daily - Continue ticagrelor 60 mg BID  Follow up Dr. Gasper Lloyd 05/09/24  Nils Pyle, PharmD PGY1 Pharmacy Resident  Karle Plumber, PharmD, BCPS, BCCP, CPP Heart Failure Clinic Pharmacist [phone]

## 2024-02-20 ENCOUNTER — Encounter: Payer: Self-pay | Admitting: Podiatry

## 2024-02-20 NOTE — Progress Notes (Signed)
 Subjective:  Patient ID: Marie Tanner, female    DOB: Dec 22, 1955,  MRN: [id number]  68 y.o. female presents at risk foot care with history of diabetic neuropathy and painful porokeratotic lesion(s) b/l lower extremities and painful mycotic toenails that limit ambulation. Painful toenails interfere with ambulation. Aggravating factors include wearing enclosed shoe gear. Pain is relieved with periodic professional debridement. Painful porokeratotic lesions are aggravated when weightbearing with and without shoegear. Pain is relieved with periodic professional debridement.  Chief Complaint  Patient presents with   Nail Problem    Pt is here for [facility] last A1C was 6.7 PCP is Dr Charlynne Pander and LOV was in November.    New problem(s): None   PCP is Estevan Oaks, NP.  Allergies  Allergen Reactions   Dilaudid [Hydromorphone Hcl] Anaphylaxis, Itching and Swelling   Epinephrine Anxiety and Other (See Comments)    Severe anxiety   Lactose Intolerance (Gi) Diarrhea   Tilactase     Other Reaction(s): Diarrhea   Jardiance [Empagliflozin]     YEAST INFECTION    Shellfish Allergy     Review of Systems: Negative except as noted in the HPI.   Objective:  Rhilee Currin is a pleasant 68 y.o. female in NAD. AAO x 3.  Vascular Examination: Vascular status intact b/l with palpable pedal pulses. CFT immediate b/l. Pedal hair present. Trace edema. No pain with calf compression b/l. Skin temperature gradient WNL b/l. No varicosities noted. No cyanosis or clubbing noted.  Neurological Examination: Pt has subjective symptoms of neuropathy. Sensation grossly intact b/l with 10 gram monofilament. Vibratory sensation intact b/l.  Dermatological Examination: Pedal skin with normal turgor, texture and tone b/l. No open wounds nor interdigital macerations noted. Toenails 1-5 b/l thick, discolored, elongated with subungual debris and pain on dorsal palpation.   Porokeratotic lesion(s) bilateral 5th toes,  submet head 2 b/l, and submet head 3 b/l. No erythema, no edema, no drainage, no fluctuance.  Musculoskeletal Examination: Muscle strength 5/5 to b/l LE.  No pain, crepitus noted b/l. Hammertoe deformity noted 2-5 b/l.  Radiographs: None.  Last A1c:       No data to display           Assessment:   1. Pain due to onychomycosis of toenails of both feet   2. Porokeratosis   3. Diabetic peripheral neuropathy associated with type 2 diabetes mellitus (HCC)    Plan:  Consent given for treatment. Patient examined. All patient's and/or POA's questions/concerns addressed on today's visit. Mycotic toenails 1-5 debrided in length and girth without incident. Porokeratotic lesion(s) bilateral 5th toes, submet head 2 b/l, and submet head 3 b/l pared and enucleated with sharp debridement without incident. Continue Revitaderm Cream once daily to calluses with weekly to biweekly filing with pumice stone. Continue daily foot inspections and monitor blood glucose per PCP/Endocrinologist's recommendations. Continue soft, supportive shoe gear daily. Report any pedal injuries to medical professional. Call office if there are any quesitons/concerns.  Return in about 3 months (around 05/17/2024).  Freddie Breech, DPM      [facility] LOCATION: [address], [place] [address]                   Office (  [phone]   [facility] LOCATION: [address] [place], [place] [address] Office [phone]

## 2024-04-04 ENCOUNTER — Encounter (HOSPITAL_COMMUNITY): Payer: Self-pay | Admitting: Emergency Medicine

## 2024-04-13 ENCOUNTER — Encounter (HOSPITAL_COMMUNITY)
Admission: RE | Admit: 2024-04-13 | Discharge: 2024-04-13 | Disposition: A | Discharge status: Home / Self Care | Source: Ambulatory Visit | Attending: Cardiology | Admitting: Cardiology

## 2024-04-13 DIAGNOSIS — K8020 Calculus of gallbladder without cholecystitis without obstruction: Secondary | ICD-10-CM | POA: Insufficient documentation

## 2024-04-13 DIAGNOSIS — J432 Centrilobular emphysema: Secondary | ICD-10-CM | POA: Insufficient documentation

## 2024-04-13 DIAGNOSIS — Z923 Personal history of irradiation: Secondary | ICD-10-CM | POA: Diagnosis not present

## 2024-04-13 DIAGNOSIS — R931 Abnormal findings on diagnostic imaging of heart and coronary circulation: Secondary | ICD-10-CM | POA: Insufficient documentation

## 2024-04-13 DIAGNOSIS — D8685 Sarcoid myocarditis: Secondary | ICD-10-CM | POA: Insufficient documentation

## 2024-04-13 LAB — NM PET CT MYOCARDIAL SARCOIDOSIS
Nuc Stress EF: 40 %
Rest Nuclear Isotope Dose: 22.7 mCi

## 2024-04-13 MED ORDER — FLUDEOXYGLUCOSE F - 18 (FDG) INJECTION
8.9200 | Freq: Once | INTRAVENOUS | Status: AC
  Administered 2024-04-13: 8.92 via INTRAVENOUS

## 2024-04-13 MED ORDER — RUBIDIUM RB82 GENERATOR (RUBYFILL)
22.6900 | PACK | Freq: Once | INTRAVENOUS | Status: AC
  Administered 2024-04-13: 22.69 via INTRAVENOUS

## 2024-05-08 NOTE — Progress Notes (Signed)
 ADVANCED HEART FAILURE CLINIC NOTE  Referring Physician: Carolyn Cisco, NP  Primary Care: Carolyn Cisco, NP Primary Cardiologist: Dr. Bruce Caper  HPI: Anina Schnake is a 68 y.o. female with coronary artery disease status post STEMI, hypertension, chronic systolic heart failure, obstructive sleep apnea not on CPAP, type 2 diabetes, history of DVT, breast cancer that presents today to establish care.  It appears that Ms. Benko's cardiac history dates back to 2015 when she had a STEMI complicated by cardiac arrest at [facility].  She received 2 stents to the left main/LAD and circumflex.  During this time she had a prolonged hospitalization requiring tracheostomy; liver laceration during CPR which required surgical repair and also developed a DVT during this time.  She underwent repeat left heart cath in January 2019 that demonstrated patent stents which were heavily calcified with other areas of nonobstructive CAD.  EF at that time was 35 to 45%.  With increasing GDMT she had improvement in EF by late 2019 to 40 to 50%.  Due to moderate to severe eccentric mitral regurgitation she had a TEE in 2022 and repeat right and left heart cath with structural evaluation in May 2022 that showed patent left main and LAD stents normal filling pressures and a normal transmitral gradient.  She was deferred for medical management only.  She had a cardiac MRI with noncoronary distribution of LGE followed by a cardiac PET that showed FDG uptake consistent with active inflammation/sarcoid. Patient now undergoing treatment for cardiac sarcoid.   Interval hx:  - Restarted prednisone  20mg  daily.  - Reports she is doing fairly well from a functional standpoint. She cooks large meals, goes to church and the grocery store with minimal shortness of breath.    Current Outpatient Medications  Medication Sig Dispense Refill   acetaminophen  (TYLENOL ) 500 MG tablet Take 1,000 mg by mouth every 6 (six) hours  as needed for moderate pain.     aspirin  EC 81 MG tablet Take 1 tablet (81 mg total) by mouth daily.     blood glucose meter kit and supplies KIT by Does not apply route daily as needed. Dispense based on patient and insurance preference. Use up to four times daily as directed. (FOR ICD-9 250.00, 250.01).     calcium -vitamin D (OSCAL WITH D) 500-5 MG-MCG tablet Take 1 tablet by mouth 2 (two) times daily.     Dulaglutide  (TRULICITY ) 4.5 MG/0.5ML SOPN Inject 4.5 mg as directed once a week. (Patient taking differently: Inject 4.5 mg as directed once a week. Takes on Saturdays) 2 mL 11   Evolocumab  (REPATHA  SURECLICK) 140 MG/ML SOAJ INJECT 140 MG INTO THE SKIN EVERY 14 (FOURTEEN) DAYS. 6 mL 3   furosemide  (LASIX ) 40 MG tablet TAKE 1 TABLET ONCE PER WEEK WITH ADDITIONAL TABLET AS NEEDED FOR SHORTNESS OF BREATH OR WEIGHT GAIN OF 2 POUNDS IN 24 HOURS OR 5 POUNDS IN 1 WEEK 30 tablet 3   gabapentin (NEURONTIN) 300 MG capsule Take 300 mg by mouth at bedtime.     glucose blood test strip Use as instructed. E11.5. To check blood glucose daily. 100 each 12   ketoconazole  (NIZORAL ) 2 % cream Apply to both feet and between toes once daily for 6 weeks. (Patient taking differently: Apply to both feet and between toes once daily for 6 weeks as needed) 60 g 1   magnesium  oxide (MAG-OX) 400 MG tablet TAKE 1 TABLET BY MOUTH EVERY DAY 90 tablet 0   metoprolol  succinate (TOPROL -XL) 25 MG  24 hr tablet Take 1 tablet (25 mg total) by mouth daily. Take with or immediately following a meal. 90 tablet 3   mycophenolate  (CELLCEPT ) 500 MG tablet TAKE 3 TABLETS (1500 MG) BY MOUTH TWICE A DAY 540 tablet 1   nitroGLYCERIN  (NITROSTAT ) 0.4 MG SL tablet Place 1 tablet (0.4 mg total) under the tongue every 5 (five) minutes as needed for chest pain. max of 3 if no relief call 911 25 tablet PRN   pantoprazole  (PROTONIX ) 40 MG tablet Take 1 tablet (40 mg total) by mouth daily. 30 tablet 5   predniSONE  (DELTASONE ) 20 MG tablet Take 1 tablet  (20 mg total) by mouth daily with breakfast. 30 tablet 3   rosuvastatin  (CRESTOR ) 20 MG tablet Take 1 tablet by mouth 3 times per week (Patient taking differently: Take 1 tablet by mouth 3 times per week on Mondays Wednesdays and Fridays) 40 tablet 5   sacubitril -valsartan  (ENTRESTO ) 97-103 MG Take 1 tablet by mouth 2 (two) times daily. 180 tablet 3   spironolactone  (ALDACTONE ) 25 MG tablet Take 1 tablet (25 mg total) by mouth daily. 30 tablet 11   sulfamethoxazole -trimethoprim  (BACTRIM  DS) 800-160 MG tablet Take 1 tablet by mouth 3 (three) times a week. Take on Mondays, Wednesdays, and Fridays. 12 tablet 3   ticagrelor  (BRILINTA ) 60 MG TABS tablet Take 1 tablet (60 mg total) by mouth 2 (two) times daily. 180 tablet 3   No current facility-administered medications for this encounter.   PHYSICAL EXAM: Vitals:   05/09/24 0929  BP: 130/74  Pulse: 75  SpO2: 100%   GENERAL: NAD Lungs- CTA CARDIAC:  JVP: 7 cm          Normal rate with regular rhythm. no murmur.  Pulses 2+. no edema.  ABDOMEN: Soft, non-tender, non-distended.  EXTREMITIES: Warm and well perfused.  NEUROLOGIC: No obvious FND   DATA REVIEW  ECG: 03/26/23: NSR  As per my personal interpretation  ECHO: 05/07/22: LVEF 35%-40%, eccentric moderate - severe MR, normal RV function As per my personal interpretation 01/19/24: LVEF 30-35%; moderate mR.   CATH: 04/21/21:   Nonobstructive coronary artery disease with up to 40% stenosis involving the mid LAD and mid LCx.  Overall appearance is similar to prior catheterization in 2019. Patent LMCA/LAD stent. Normal left and right heart filling pressures. Mild pulmonary hypertension. Normal to supranormal Fick cardiac output/index. Normal transmitral gradient.  CMR:  11/03/22:  1. There is subendocardial LGE in apical anterior and mid to apical inferior walls, which could be consistent with LAD territory infarct as patient has known large wrap-around LAD, but the apex is  spared, which argues against this. In addition, there is focal area of subendocardial LGE suggesting infarct in the basal anteroseptum, which is not in a coronary distribution. There is also mid inferoseptal midwall LGE, which is a nonischemic scar pattern. 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 Cardiac sarcoid is on the differential, as can cause both subendocardial and midwall LGE. Recommend cardiac PET to evaluate for sarcoid.  2. Mild LV hypertrophy, normal size, and moderate systolic dysfunction (EF 35%)  3.  Normal RV size and systolic function (EF 64%)  4.  Mild mitral regurgitation (regurgitant fraction 16%)  PET (02/09/23)  FDG uptake was observed. FDG uptake was diffuse. FDG uptake was present in the apical to mid anterior, septal and apex segment(s). LV perfusion is abnormal. Defect 1: There is a large defect with severe reduction in uptake present in the apical anterior,  inferior and apex location(s). There is abnormal wall motion in the defect area. Consistent with infarction. Defect 2: There is a small defect with mild reduction in uptake present in the mid septal location(s). There is abnormal wall motion in the defect area. Consistent with infarction.   Left ventricular function is abnormal. There were multiple regional abnormalities. EF: 37 %. Akinesis of the apical septum, apical inferior segment, and apex. Akinesis of the basal to mid septum. End diastolic cavity size is mildly enlarged. End systolic cavity size is mildly enlarged.   Coronary calcium  assessment not performed due to prior revascularization.   FDG uptake findings are consistent with active inflammation/sarcoidosis.  ASSESSMENT & PLAN:  Heart failure with reduced EF Etiology of WU:[id number] cardiomyopathy; hx of STEMI with PCI to the LAD, Lcx and LM; CMR with noncoronary distribution of LGE with active uptake via Cardiac PET.  NYHA class / AHA  Stage:NYHAIIB Volume status & Diuretics: Euvolemic, lasix  40mg  PRN only Vasodilators:Entresto  97/103mg  BID Beta-Blocker: continue metoprolol  to 25mg  daily.  MRA:spironolactone  50mg ;  Cardiometabolic:SGLT2i led to yeast infection. Will continue to hold as she is now on immunosuppressives. Devices therapies & Valvulopathies: Drop in LVEF to 30-35% however improvement on PET to 40%. Will repeat TTE prior to next follow up to re-assess.  Advanced therapies:not indicated  2. Cardiac sarcoid - Vaccinations confirmed.  - PET from 02/09/23 w/ LVEF 37% - Currently on cellcept  1500mg  BID.  - Restarted on prednisone  20mg  daily on 02/15/24 by Luster Salters (reviewed notes).  - Continue bactrim  while on prednisone  (3x weekly) - Continue PPI - Repeat PET scan in 04/13/24 with improvement in FDG uptake; however, FDG still present in the apical anterior and apex.  - Schedule PET for 2-67m. Repeat echo next month. Hopeful to wean off prednisone  if uptake has reduced.  3. Obesity - Body mass index is 35.41 kg/m. - Discussed side effects of prednisone  on weight and glucose.  - Stable; discussed importance of continued activity.   4. OSA - sleep study pending; needs split night sleep study.  - will schedule at follow up.   5. Moderate to severe MR - Moderate by TTE in 01/19/24.    Rojean Ige Advanced Heart Failure Mechanical Circulatory Support

## 2024-05-09 ENCOUNTER — Ambulatory Visit (HOSPITAL_COMMUNITY)
Admission: RE | Admit: 2024-05-09 | Discharge: 2024-05-09 | Disposition: A | Discharge status: Home / Self Care | Source: Ambulatory Visit | Attending: Cardiology | Admitting: Cardiology

## 2024-05-09 VITALS — BP 130/74 | HR 75 | Wt 193.6 lb

## 2024-05-09 DIAGNOSIS — I252 Old myocardial infarction: Secondary | ICD-10-CM | POA: Insufficient documentation

## 2024-05-09 DIAGNOSIS — Z7985 Long-term (current) use of injectable non-insulin antidiabetic drugs: Secondary | ICD-10-CM | POA: Diagnosis not present

## 2024-05-09 DIAGNOSIS — Z955 Presence of coronary angioplasty implant and graft: Secondary | ICD-10-CM | POA: Insufficient documentation

## 2024-05-09 DIAGNOSIS — Z86718 Personal history of other venous thrombosis and embolism: Secondary | ICD-10-CM | POA: Insufficient documentation

## 2024-05-09 DIAGNOSIS — I5022 Chronic systolic (congestive) heart failure: Secondary | ICD-10-CM | POA: Insufficient documentation

## 2024-05-09 DIAGNOSIS — D8685 Sarcoid myocarditis: Secondary | ICD-10-CM | POA: Diagnosis not present

## 2024-05-09 DIAGNOSIS — I110 Hypertensive heart disease with heart failure: Secondary | ICD-10-CM | POA: Insufficient documentation

## 2024-05-09 DIAGNOSIS — Z6835 Body mass index (BMI) 35.0-35.9, adult: Secondary | ICD-10-CM | POA: Insufficient documentation

## 2024-05-09 DIAGNOSIS — D849 Immunodeficiency, unspecified: Secondary | ICD-10-CM | POA: Diagnosis not present

## 2024-05-09 DIAGNOSIS — I255 Ischemic cardiomyopathy: Secondary | ICD-10-CM | POA: Insufficient documentation

## 2024-05-09 DIAGNOSIS — G4733 Obstructive sleep apnea (adult) (pediatric): Secondary | ICD-10-CM | POA: Diagnosis not present

## 2024-05-09 DIAGNOSIS — I2510 Atherosclerotic heart disease of native coronary artery without angina pectoris: Secondary | ICD-10-CM | POA: Insufficient documentation

## 2024-05-09 DIAGNOSIS — E119 Type 2 diabetes mellitus without complications: Secondary | ICD-10-CM | POA: Insufficient documentation

## 2024-05-09 DIAGNOSIS — I5042 Chronic combined systolic (congestive) and diastolic (congestive) heart failure: Secondary | ICD-10-CM | POA: Diagnosis present

## 2024-05-09 DIAGNOSIS — Z7902 Long term (current) use of antithrombotics/antiplatelets: Secondary | ICD-10-CM | POA: Insufficient documentation

## 2024-05-09 DIAGNOSIS — Z853 Personal history of malignant neoplasm of breast: Secondary | ICD-10-CM | POA: Insufficient documentation

## 2024-05-09 DIAGNOSIS — I340 Nonrheumatic mitral (valve) insufficiency: Secondary | ICD-10-CM | POA: Insufficient documentation

## 2024-05-09 DIAGNOSIS — E669 Obesity, unspecified: Secondary | ICD-10-CM | POA: Insufficient documentation

## 2024-05-09 LAB — COMPREHENSIVE METABOLIC PANEL WITH GFR
ALT: 15 U/L (ref 0–44)
AST: 16 U/L (ref 15–41)
Albumin: 3.5 g/dL (ref 3.5–5.0)
Alkaline Phosphatase: 54 U/L (ref 38–126)
Anion gap: 10 (ref 5–15)
BUN: 12 mg/dL (ref 8–23)
CO2: 24 mmol/L (ref 22–32)
Calcium: 9.4 mg/dL (ref 8.9–10.3)
Chloride: 103 mmol/L (ref 98–111)
Creatinine, Ser: 0.86 mg/dL (ref 0.44–1.00)
GFR, Estimated: >60 mL/min (ref >60)
Glucose, Bld: 88 mg/dL (ref 70–99)
Potassium: 4.8 mmol/L (ref 3.5–5.1)
Sodium: 137 mmol/L (ref 135–145)
Total Bilirubin: 0.8 mg/dL (ref 0.0–1.2)
Total Protein: 6.7 g/dL (ref 6.5–8.1)

## 2024-05-09 LAB — CBC
HCT: 35.1 % — ABNORMAL LOW (ref 36.0–46.0)
Hemoglobin: 11.2 g/dL — ABNORMAL LOW (ref 12.0–15.0)
MCH: 27.4 pg (ref 26.0–34.0)
MCHC: 31.9 g/dL (ref 30.0–36.0)
MCV: 85.8 fL (ref 80.0–100.0)
Platelets: 299 10*3/uL (ref 150–400)
RBC: 4.09 MIL/uL (ref 3.87–5.11)
RDW: 13.9 % (ref 11.5–15.5)
WBC: 3.2 10*3/uL — ABNORMAL LOW (ref 4.0–10.5)
nRBC: 0 % (ref 0.0–0.2)

## 2024-05-09 LAB — BRAIN NATRIURETIC PEPTIDE: B Natriuretic Peptide: 161.7 pg/mL — ABNORMAL HIGH (ref 0.0–100.0)

## 2024-05-09 NOTE — Patient Instructions (Signed)
 Medication Changes:  No Changes In Medications at this time.   Lab Work:  Labs done today, your results will be available in MyChart, we will contact you for abnormal readings.  Testing/Procedures:  THIS NEEDS TO BE DONE IN 2 MONTHS--- SCHEDULING WILL CONTACT YOU TO SCHEDULE CLOSER TO TIME  Cardiac Sarcoidosis/Inflammation PET Scan Patient Instructions  Please report to Radiology at the [facility] Main Entrance 15 minutes early for your test.  [address] [place], [place] [address] BRING FOOD DIARY WITH YOU TO THIS APPOINTMENT For 24 hours before the test: Do not exercise! Do not eat after 5 pm the day before your test! To make sure that your scanning results are accurate, you MUST follow the sarcoid prep meal diet starting the day before your PET scan. This diet involves eating no carbohydrates 24 hours before the test.  You will keep a log of all that you eat the day before your test. If you have questions or do not understand this diet, please call [phone] for more information. If you are unable to follow this diet, please discuss an alternative strategy with the coordinator.  If you are diabetic, continue your diabetes medications as usual on the day before until you begin to fast. NO DIABETES MEDICATIONS ONCE YOU BEGIN TO FAST. What foods can I eat the day before my test?  Drink only water or black coffee (WITHOUT sugar, artificial sweetener, cream, or milk). Eggs (prepared without milk or cheese)  Meat that is either broiled or pan fried in butter WITHOUT breading (chicken, [place], bacon, meat-only sausage, hamburger, steak, fish) Butter, salt & pepper What foods must I AVOID the day before my test?  Do not consume alcoholic beverages, sodas, fruit juice, coffee creamer, or sports drinks  Do not eat vegetables, beans, nuts, fruits, juices, bread, grains, rice, pasta, potatoes, or any baked goods Do not eat dairy products (milk, cheese, etc.)  Do not eat  mayonnaise, ketchup, tartar sauce, mustard, or other condiments Do not add sugar, artificial sweeteners, or Splenda (sucralose) to foods or drinks  Do not eat breaded foods (like fried chicken)  Do not eat sweets, candy, gum, sweetened cough drops, lozenges, or sugar  Do not eat sweetened, grilled, or cured meats or meat with carbohydrate-containing additives (some sausages, ham, sweetened bacon) Suggested items for breakfast, lunch, or dinner:  Breakfast  3 to 5 fatty sausage links fried in butter. 3 to 5 bacon strips.  3 eggs pan fried in butter (no milk or cheese).  Lunch/Dinner  2 hamburger patties fried in butter. Chicken or fatty fish pan fried in butter. No breading. 8 oz. fatty steak pan fried in butter.  Beverages  Drink only water or black coffee. DO NOT ADD SUGAR, ARTIFICIAL SWEETENER, CREAM, OR MILK  Please call [facility] to cancel or reschedule your appointment at [phone] For more information and frequently asked questions, please visit our website : [email or web address]   Cardiac Sarcoidosis/Inflammation PET Scan  Food Diary Name: _____________________________ Please fill in EXACTLY what you have eaten and when for 24 hours PRIOR to your test date.  Time Food/Drink Comments  Breakfast                Lunch                [employer]  DO NOT EXERCISE THE DAY BEFORE YOUR TEST DO NOT EAT AFTER 5 PM THE DAY BEFORE YOUR TEST.  ON THE DAY OF YOUR TEST, DO NOT EAT ANY FOOD AND ONLY DRINK CLEAR WATER! PLEASE BRING THIS FOOD DIARY WITH YOU TO YOUR APPOINTMENT     ECHOCARDIOGRAM AS SCHEDULED   Follow-Up in: 1 MONTH WITH DR. Bruce Caper   AND THEN AGAIN IN 2 MONTHS WITH PHARMACY CLINIC   At the [facility], you and your health needs are our priority. We have a designated team specialized in the treatment of Heart Failure. This Care Team includes your primary Heart Failure  Specialized Cardiologist (physician), Advanced Practice Providers (APPs- Physician Assistants and Nurse Practitioners), and Pharmacist who all work together to provide you with the care you need, when you need it.   You may see any of the following providers on your designated Care Team at your next follow up:  Dr. Jules Oar Dr. Peder Bourdon Dr. Alwin Baars Dr. Judyth Nunnery Nieves Bars, NP Ruddy Corral, [place] [facility] [place], [place] Dennise Fitz, NP [place] Lee, NP Luster Salters, PharmD   Please be sure to bring in all your medications bottles to every appointment.   Need to Contact Us :  If you have any questions or concerns before your next appointment please send us  a message through [place] or call our office at [phone].    TO LEAVE A MESSAGE FOR THE NURSE SELECT OPTION 2, PLEASE LEAVE A MESSAGE INCLUDING: YOUR NAME DATE OF BIRTH CALL BACK NUMBER REASON FOR CALL**this is important as we prioritize the call backs  YOU WILL RECEIVE A CALL BACK THE SAME DAY AS LONG AS YOU CALL BEFORE 4:00 PM

## 2024-05-11 ENCOUNTER — Telehealth (HOSPITAL_COMMUNITY): Payer: Self-pay | Admitting: Cardiology

## 2024-05-24 ENCOUNTER — Encounter (HOSPITAL_BASED_OUTPATIENT_CLINIC_OR_DEPARTMENT_OTHER): Payer: Self-pay | Admitting: Cardiovascular Disease

## 2024-05-24 MED ORDER — SACUBITRIL-VALSARTAN 97-103 MG PO TABS
1.0000 | ORAL_TABLET | Freq: Two times a day (BID) | ORAL | 3 refills | Status: DC
Start: 2024-05-24 — End: 2024-05-26

## 2024-05-25 ENCOUNTER — Ambulatory Visit (HOSPITAL_COMMUNITY)
Admission: RE | Admit: 2024-05-25 | Discharge: 2024-05-25 | Disposition: A | Discharge status: Home / Self Care | Source: Ambulatory Visit | Attending: Nurse Practitioner | Admitting: Nurse Practitioner

## 2024-05-25 DIAGNOSIS — I080 Rheumatic disorders of both mitral and aortic valves: Secondary | ICD-10-CM | POA: Diagnosis not present

## 2024-05-25 DIAGNOSIS — D8685 Sarcoid myocarditis: Secondary | ICD-10-CM | POA: Diagnosis present

## 2024-05-25 DIAGNOSIS — I509 Heart failure, unspecified: Secondary | ICD-10-CM | POA: Insufficient documentation

## 2024-05-25 LAB — ECHOCARDIOGRAM COMPLETE
Area-P 1/2: 3.5 cm2
Calc EF: 42.2 %
S' Lateral: 3.2 cm
Single Plane A2C EF: 42.9 %
Single Plane A4C EF: 40.8 %

## 2024-05-26 ENCOUNTER — Encounter (HOSPITAL_BASED_OUTPATIENT_CLINIC_OR_DEPARTMENT_OTHER): Payer: Self-pay | Admitting: Cardiovascular Disease

## 2024-05-26 ENCOUNTER — Ambulatory Visit (HOSPITAL_COMMUNITY): Payer: Self-pay | Admitting: Cardiology

## 2024-05-26 DIAGNOSIS — I5042 Chronic combined systolic (congestive) and diastolic (congestive) heart failure: Secondary | ICD-10-CM

## 2024-05-26 MED ORDER — SACUBITRIL-VALSARTAN 97-103 MG PO TABS: 1.0000 | ORAL_TABLET | Freq: Two times a day (BID) | ORAL | 3 refills | Status: AC

## 2024-05-29 NOTE — Telephone Encounter (Signed)
 Patient aware of results and voiced understanding  -referral placed

## 2024-05-31 ENCOUNTER — Ambulatory Visit (INDEPENDENT_AMBULATORY_CARE_PROVIDER_SITE_OTHER): Admitting: Podiatry

## 2024-05-31 ENCOUNTER — Encounter: Payer: Self-pay | Admitting: Podiatry

## 2024-05-31 DIAGNOSIS — B351 Tinea unguium: Secondary | ICD-10-CM | POA: Diagnosis not present

## 2024-05-31 DIAGNOSIS — Q828 Other specified congenital malformations of skin: Secondary | ICD-10-CM

## 2024-05-31 DIAGNOSIS — E1142 Type 2 diabetes mellitus with diabetic polyneuropathy: Secondary | ICD-10-CM

## 2024-05-31 DIAGNOSIS — B353 Tinea pedis: Secondary | ICD-10-CM | POA: Diagnosis not present

## 2024-05-31 DIAGNOSIS — M79675 Pain in left toe(s): Secondary | ICD-10-CM

## 2024-05-31 DIAGNOSIS — M79674 Pain in right toe(s): Secondary | ICD-10-CM | POA: Diagnosis not present

## 2024-05-31 MED ORDER — KETOCONAZOLE 2 % EX CREA: TOPICAL_CREAM | CUTANEOUS | 1 refills | Status: AC

## 2024-05-31 NOTE — Patient Instructions (Signed)
   For dry or cracked skin, recommend daily use of Bag Balm Hand and Body Moistuizer which may be purchased at local drug store or on [employer].

## 2024-06-03 ENCOUNTER — Encounter: Payer: Self-pay | Admitting: Podiatry

## 2024-06-03 NOTE — Progress Notes (Signed)
 Subjective:  Patient ID: Marie Tanner, female    DOB: 01/24/1956,  MRN: [id number]  Helem Reesor presents to clinic today for at risk foot care with history of diabetic neuropathy and painful porokeratotic lesion(s) of both feet and painful mycotic toenails that limit ambulation. Painful toenails interfere with ambulation. Aggravating factors include wearing enclosed shoe gear. Pain is relieved with periodic professional debridement. Painful porokeratotic lesions are aggravated when weightbearing with and without shoegear. Pain is relieved with periodic professional debridement.  Chief Complaint  Patient presents with   [facility]    [facility]. A1C 6.5 Toenail trim. LOV with PCP 03/20/24.   New problem(s): None.   PCP is Delores Rojelio Caldron, NP.  Allergies  Allergen Reactions   Dilaudid [Hydromorphone Hcl] Anaphylaxis, Itching and Swelling   Epinephrine  Anxiety and Other (See Comments)    Severe anxiety   Lactose Intolerance (Gi) Diarrhea   Tilactase     Other Reaction(s): Diarrhea   Jardiance  [Empagliflozin ]     YEAST INFECTION    Shellfish Allergy     Review of Systems: Negative except as noted in the HPI.  Objective: No changes noted in today's physical examination. There were no vitals filed for this visit. Caileen Veracruz is a pleasant 68 y.o. female in NAD. AAO x 3.  Vascular Examination: Capillary refill time immediate b/l. Palpable pedal pulses. Pedal hair present b/l. No pain with calf compression b/l. Skin temperature gradient WNL b/l. No cyanosis or clubbing b/l. No ischemia or gangrene noted b/l. Trace edema noted BLE.  Neurological Examination: Sensation grossly intact b/l with 10 gram monofilament. Vibratory sensation intact b/l. Pt has subjective symptoms of neuropathy.  Dermatological Examination: No open wounds. No interdigital macerations.   Toenails 1-5 b/l thick, discolored, elongated with subungual debris and pain on dorsal palpation.   Porokeratotic lesion(s)  submet head 2 b/l and submet head 3 b/l. No erythema, no edema, no drainage, no fluctuance. Diffuse scaling noted peripherally and plantarly b/l feet.  No interdigital macerations.  No blisters, no weeping. No signs of secondary bacterial infection noted.  Musculoskeletal Examination: Muscle strength 5/5 to all lower extremity muscle groups bilaterally. Hammertoe(s) 2-5 b/l.SABRA No pain, crepitus or joint limitation noted with ROM b/l LE.  Patient ambulates independently without assistive aids.  Radiographs: None  Assessment/Plan: 1. Pain due to onychomycosis of toenails of both feet   2. Tinea pedis of both feet   3. Porokeratosis   4. Diabetic peripheral neuropathy associated with type 2 diabetes mellitus (HCC)     Meds ordered this encounter  Medications   ketoconazole  (NIZORAL ) 2 % cream    Sig: Apply to both feet and between toes once daily.    Dispense:  60 g    Refill:  1  Patient was evaluated and treated. All patient's and/or POA's questions/concerns addressed on today's visit. Toenails 1-5 debrided in length and girth without incident. Porokeratotic lesion(s) submet head 2 b/l and submet head 3 b/l pared with sharp debridement without incident. Continue daily foot inspections and monitor blood glucose per PCP/Endocrinologist's recommendations. Continue soft, supportive shoe gear daily. Report any pedal injuries to medical professional. -For tinea pedis, Rx sent to pharmacy for Ketoconazole  Cream 2% to be applied once daily for six weeks. Apply Bag Balm Hand and Body Moisturizer to feet once daily for dry skin. -Patient/POA to call should there be question/concern in the interim.   Return in about 3 months (around 08/31/2024).  Delon LITTIE Merlin, DPM      [place] LOCATION: [address] [place], [place] [address]                   Office [phone]   [facility] LOCATION: [address] [place], [place] [address] Office  [phone]

## 2024-06-07 ENCOUNTER — Encounter (HOSPITAL_COMMUNITY): Admitting: Cardiology

## 2024-06-08 ENCOUNTER — Encounter (HOSPITAL_COMMUNITY): Admitting: Cardiology

## 2024-06-19 ENCOUNTER — Ambulatory Visit: Attending: Internal Medicine | Admitting: Internal Medicine

## 2024-06-19 ENCOUNTER — Encounter: Payer: Self-pay | Admitting: Internal Medicine

## 2024-06-19 VITALS — BP 118/60 | HR 94 | Ht 62.0 in | Wt 194.0 lb

## 2024-06-19 DIAGNOSIS — I472 Ventricular tachycardia, unspecified: Secondary | ICD-10-CM

## 2024-06-19 DIAGNOSIS — Z01812 Encounter for preprocedural laboratory examination: Secondary | ICD-10-CM | POA: Diagnosis not present

## 2024-06-19 NOTE — Progress Notes (Signed)
 HPI [name] is referred by Dr. Gardenia for consideration for ICD insertion. She is a pleasant 68 yo woman with a h/o anterior MI/shock s/p emergent PCI of the LM and LAD after exeriencing a VF arrest almost 10 years ago. 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 She alos has a h/o breast CA. She has done well otherwise. No syncope, chest pain or sob.  Allergies  Allergen Reactions   Dilaudid [Hydromorphone Hcl] Anaphylaxis, Itching and Swelling   Epinephrine  Anxiety and Other (See Comments)    Severe anxiety   Lactose Intolerance (Gi) Diarrhea   Tilactase     Other Reaction(s): Diarrhea   Jardiance  [Empagliflozin ]     YEAST INFECTION    Shellfish Allergy      Current Outpatient Medications  Medication Sig Dispense Refill   acetaminophen  (TYLENOL ) 500 MG tablet Take 1,000 mg by mouth every 6 (six) hours as needed for moderate pain.     aspirin  EC 81 MG tablet Take 1 tablet (81 mg total) by mouth daily.     blood glucose meter kit and supplies KIT by Does not apply route daily as needed. Dispense based on patient and insurance preference. Use up to four times daily as directed. (FOR ICD-9 250.00, 250.01).     calcium -vitamin D (OSCAL WITH D) 500-5 MG-MCG tablet Take 1 tablet by mouth 2 (two) times daily.     cetirizine (ZYRTEC) 10 MG tablet Take 10 mg by mouth as needed for allergies.     Dulaglutide  (TRULICITY ) 4.5 MG/0.5ML SOPN Inject 4.5 mg as directed once a week. (Patient taking differently: Inject 4.5 mg as directed once a week. Takes on Saturdays) 2 mL 11   Evolocumab  (REPATHA  SURECLICK) 140 MG/ML SOAJ INJECT 140 MG INTO THE SKIN EVERY 14 (FOURTEEN) DAYS. 6 mL 3   fluticasone  (FLONASE ) 50 MCG/ACT nasal spray Place 1 spray into both nostrils as needed for allergies.     furosemide  (LASIX ) 40 MG tablet TAKE 1 TABLET ONCE PER WEEK WITH ADDITIONAL TABLET AS NEEDED FOR SHORTNESS OF BREATH OR WEIGHT GAIN OF 2 POUNDS IN 24 HOURS OR 5 POUNDS  IN 1 WEEK 30 tablet 3   gabapentin (NEURONTIN) 300 MG capsule Take 300 mg by mouth at bedtime.     glucose blood test strip Use as instructed. E11.5. To check blood glucose daily. 100 each 12   ketoconazole  (NIZORAL ) 2 % cream Apply to both feet and between toes once daily. 60 g 1   magnesium  oxide (MAG-OX) 400 MG tablet TAKE 1 TABLET BY MOUTH EVERY DAY 90 tablet 0   metoprolol  succinate (TOPROL -XL) 25 MG 24 hr tablet Take 1 tablet (25 mg total) by mouth daily. Take with or immediately following a meal. 90 tablet 3   mycophenolate  (CELLCEPT ) 500 MG tablet TAKE 3 TABLETS (1500 MG) BY MOUTH TWICE A DAY 540 tablet 1   nitroGLYCERIN  (NITROSTAT ) 0.4 MG SL tablet Place 1 tablet (0.4 mg total) under the tongue every 5 (five) minutes as needed for chest pain. max of 3 if no relief call 911 25 tablet PRN   pantoprazole  (PROTONIX ) 40 MG tablet Take 1 tablet (40 mg total) by mouth daily. 30 tablet 5   predniSONE  (DELTASONE ) 20 MG tablet Take 1 tablet (20 mg total) by mouth daily with breakfast. 30 tablet 3   rosuvastatin  (CRESTOR ) 20 MG tablet Take 1 tablet by mouth 3 times per week (Patient taking differently:  Take 1 tablet by mouth 3 times per week on Mondays Wednesdays and Fridays) 40 tablet 5   sacubitril -valsartan  (ENTRESTO ) 97-103 MG Take 1 tablet by mouth 2 (two) times daily. 180 tablet 3   spironolactone  (ALDACTONE ) 25 MG tablet Take 1 tablet (25 mg total) by mouth daily. 30 tablet 11   sulfamethoxazole -trimethoprim  (BACTRIM  DS) 800-160 MG tablet Take 1 tablet by mouth 3 (three) times a week. Take on Mondays, Wednesdays, and Fridays. 12 tablet 3   ticagrelor  (BRILINTA ) 60 MG TABS tablet Take 1 tablet (60 mg total) by mouth 2 (two) times daily. 180 tablet 3   triamcinolone  cream (KENALOG ) 0.1 % Apply 1 Application topically daily.     No current facility-administered medications for this visit.     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   GERD (gastroesophageal reflux disease)    HCAP (healthcare-associated pneumonia) 11/22/2014   Heart murmur    High cholesterol    History of blood transfusion 09/2014   related to MI   History of gout    Hypertension    Liver laceration 10/19/2014   Palpitations 12/12/2020   Pulmonary edema 10/19/2014   Renal disorder    Renal disorder    Right hip pain 04/03/2021   Sleep apnea    suppose to wear mask but I don't (11/22/2014)   STEMI (ST elevation myocardial infarction) (HCC) 10/19/2014   Type II diabetes mellitus (HCC)    Urticaria     ROS:   All systems reviewed and negative except as noted in the HPI.   Past Surgical History:  Procedure Laterality Date   ABDOMINAL HYSTERECTOMY  ~ 2000   APPLICATION OF WOUND VAC  10/2014   over my naval (11/22/2014)   BREAST LUMPECTOMY Right 2017   BREAST LUMPECTOMY WITH AXILLARY LYMPH NODE BIOPSY     BREAST SURGERY     CESAREAN SECTION  1980; 1982   CORONARY ANGIOPLASTY WITH STENT PLACEMENT  09/2014   multiple/notes 11/22/2014   EXTRACORPOREAL CIRCULATION     Femerol artery repair Right 09/2014   pt insists it was a right femoral artery repair on 11/22/2014   LAPAROSCOPIC ABDOMINAL EXPLORATION     LEFT HEART CATH AND CORONARY ANGIOGRAPHY N/A 12/07/2017   Procedure: LEFT HEART CATH AND CORONARY ANGIOGRAPHY;  Surgeon: Anner Alm ORN, MD;  Location: [facility] INVASIVE CV LAB;  Service: Cardiovascular;  Laterality: N/A;   LEFT VENTRICULAR ASSIST DEVICE     pt is not aware of this hx on 11/22/2014   Liver laceration repair  09/2014   related to CPR/notes 11/22/2014   RIGHT/LEFT HEART CATH AND CORONARY ANGIOGRAPHY N/A 04/21/2021   Procedure: RIGHT/LEFT HEART CATH AND CORONARY ANGIOGRAPHY;  Surgeon: Mady Bruckner, MD;  Location: [facility]  INVASIVE CV LAB;  Service: Cardiovascular;  Laterality: N/A;   TEE WITHOUT CARDIOVERSION N/A 03/13/2021   Procedure: TRANSESOPHAGEAL ECHOCARDIOGRAM (TEE);  Surgeon: Loni Soyla LABOR, MD;  Location: [facility] ENDOSCOPY;  Service: Cardiology;  Laterality: N/A;   TRACHEOSTOMY  09/2014   closed on it's own when they took it out   TUBAL LIGATION  1982     Family History  Problem Relation Age of Onset   Diabetes Mother    Hypertension Mother  COPD Mother    Heart disease Father    Heart attack Father    Alzheimer's disease Father    Diabetes Brother    Asthma Daughter    Stroke Neg Hx    Colon cancer Neg Hx    Stomach cancer Neg Hx    Pancreatic cancer Neg Hx    AAA (abdominal aortic aneurysm) Neg Hx      Social History   Socioeconomic History   Marital status: Divorced    Spouse name: Not on file   Number of children: 2   Years of education: Not on file   Highest education level: Not on file  Occupational History   Not on file  Tobacco Use   Smoking status: Never   Smokeless tobacco: Never  Vaping Use   Vaping status: Never Used  Substance and Sexual Activity   Alcohol use: Never   Drug use: No   Sexual activity: Not Currently  Other Topics Concern   Not on file  Social History Narrative   Not on file   Social Drivers of Health   Financial Resource Strain: Not on File (03/19/2022)   Received from [employer]    Financial Resource Strain: 0  Food Insecurity: Declined (04/06/2023)   Received from [facility] Insecurity    Food: 99  Transportation Needs: Not at Risk (04/06/2023)   Received from [employer] Needs    Transportation: 1  Physical Activity: Not on File (03/19/2022)   Received from [facility]   Physical Activity    Physical Activity: 0  Stress: Not on File (03/19/2022)   Received from [facility]   Stress    Stress: 0  Social Connections: Not on File (08/04/2023)   Received from [employer] [facility]   Social Connections    Connectedness: 0   Intimate Partner Violence: Not on file     BP 118/60   Pulse 94   Ht 5' 2 (1.575 m)   Wt 194 lb (88 kg)   SpO2 97%   BMI 35.48 kg/m   Physical Exam:  Well appearing NAD HEENT: Unremarkable Neck:  No JVD, no thyromegally Lymphatics:  No adenopathy Back:  No CVA tenderness Lungs:  Clear with no wheezes HEART:  Regular rate rhythm, no murmurs, no rubs, no clicks Abd:  soft, positive bowel sounds, no organomegally, no rebound, no guarding Ext:  2 plus pulses, no edema, no cyanosis, no clubbing Skin:  No rashes no nodules Neuro:  CN II through XII intact, motor grossly intact  EKG - NSR with inferior and anterior MI.   Assess/Plan: ICM - she is s/p MI and her EF is 30%. She is on maximal GDMT under Dr. Sharol direction. I have offered her ICD Insertion and the indications/risk/benefits/goals/expectations were reviewed and she will call us  if she wishes to proceed.  Danelle Wauneta Silveria,MD

## 2024-06-19 NOTE — Patient Instructions (Addendum)
 Medication Instructions:  Your physician recommends that you continue on your current medications as directed. Please refer to the Current Medication list given to you today.  *If you need a refill on your cardiac medications before your next appointment, please call your pharmacy*  Lab Work: Labs with in 30 days of procedure.  You may go to any [facility] Location for your lab work:  [facility] - [address] ([facility]) - [address] [phone] [address]  [place] - [address]   [place]  - [address]   [place] - [address] - [address] [employer]  - [address] ([facility]   If you have labs (blood work) drawn today and your tests are completely normal, you will receive your results only by: [employer] (if you have MyChart)  If you have any lab test that is abnormal or we need to change your treatment, we will call you or send a MyChart message to review the results.  Testing/Procedures: ICD implant. Instructions to come  Follow-Up: At [facility], you and your health needs are our priority.  As part of our continuing mission to provide you with exceptional heart care, we have created designated Provider Care Teams.  These Care Teams include your primary Cardiologist (physician) and Advanced Practice Providers (APPs -  Physician Assistants and Nurse Practitioners) who all work together to provide you with the care you need, when you need it.   Your next appointment:   To be scheduled

## 2024-06-20 ENCOUNTER — Encounter (HOSPITAL_COMMUNITY): Payer: Self-pay

## 2024-06-26 ENCOUNTER — Other Ambulatory Visit (HOSPITAL_COMMUNITY): Payer: Self-pay | Admitting: Cardiology

## 2024-06-26 DIAGNOSIS — D8685 Sarcoid myocarditis: Secondary | ICD-10-CM

## 2024-06-29 NOTE — Progress Notes (Signed)
 Advanced Heart Failure Clinic Note   Referring Physician: Delores Rojelio Caldron, NP  Primary Care: Marie Rojelio Caldron, NP Primary Cardiologist: Dr. Gardenia  HPI:  Marie Tanner is a 68 y.o. female with coronary artery disease status post STEMI, hypertension, chronic systolic heart failure, obstructive sleep apnea not on CPAP, type 2 diabetes, history of DVT, breast cancer.  It appears that Marie Tanner's cardiac history dates back to 2015 when she had a STEMI complicated by cardiac arrest at [facility].  She received 2 stents to the left main/LAD and circumflex.  During this time she had a prolonged hospitalization requiring tracheostomy; liver laceration during CPR which required surgical repair and also developed a DVT during this time.  She underwent repeat left heart cath in January 2019 that demonstrated patent stents which were heavily calcified with other areas of nonobstructive CAD.  EF at that time was 35 to 45%.  With increasing GDMT she had improvement in EF by late 2019 to 40 to 50%.  Due to moderate to severe eccentric mitral regurgitation she had a TEE in 2022 and repeat right and left heart cath with structural evaluation in May 2022 that showed patent left main and LAD stents normal filling pressures and a normal transmitral gradient.  She was deferred for medical management only.  She had a cardiac MRI with noncoronary distribution of LGE followed by a cardiac PET that showed FDG uptake consistent with active inflammation/sarcoid. Patient now undergoing treatment for cardiac sarcoid.    Presented to AHF Clinic 05/09/24 with Dr. Gardenia. Since last visit she had restarted prednisone  20 mg daily. Reported she was doing fairly well from a functional standpoint. She cooks large meals, goes to church and the grocery store with minimal shortness of breath.     Today he returns to HF clinic for pharmacist medication titration. At last visit with MD no medication changes were made,  but she was referred for EP for ICD consideration, now scheduled for 07/27/24. Was scheduled to have repeat cardiac PET 07/06/24 but was canceled because of need for room maintenance. Patient never got a call to inform her it was canceled or to reschedule. Overall she is feeling well today. Says she has good days and bad days, but also notes she has had a head cold recently. 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 She does feel worse when her BP is higher, says it has been running ~140-145/80s at home, which is higher than it used to be (which was ~130/80s). All home BP readings are taken before medications. BP in clinic today 142/76. She has only taken her mycophenolate  today before clinic and not taken her BP meds because pharmacist at specialty pharmacy instructed her to take mycophenolate  on an empty stomach 2 hours before her other medications. Weight stable at 192-193 lbs at home. She takes Lasix  ~3 times per week. Has noticed some LEE recently, but attributes this to the heat and being on her feet at her job a lot. She elevates her legs and symptoms resolve. No LEE on exam today. No PND/orthopnea.    HF Medications: Metoprolol  succinate 25 mg daily  Entresto  97/103 mg BID Spironolactone  25 mg daily  Lasix  40 mg PRN  Has the patient been experiencing any side effects to the medications prescribed?  no  Does the patient have any problems obtaining medications due to transportation or finances?   No; [employer]HC dual  complete  Understanding of regimen: good Understanding of indications: good Potential of compliance: good Patient understands to avoid NSAIDs. Patient understands to avoid decongestants.    Pertinent Lab Values: 07/11/24: Serum creatinine 0.80, BUN 12, Potassium 4.4, Sodium 139, HgB 11.9, WBC 7.8, PLT 322  Vital Signs: Weight: 194.2 lbs (last clinic weight: 193.6 lbs) Blood pressure: 142/76 (has not taken AM BP medications yet  today)  Heart rate: 71   Assessment/Plan: Heart failure with reduced EF Etiology of YQ:pdryzfpr cardiomyopathy; hx of STEMI with PCI to the LAD, Lcx and LM; CMR with noncoronary distribution of LGE with active uptake via Cardiac PET.  NYHA class / AHA Stage:NYHA IIB Volume status & Diuretics: Euvolemic, Continue Lasix  40 mg PRN only (takes 3x/week) Vasodilators:Continue  Entresto  97/103 mg BID Beta-Blocker: Continue metoprolol  25 mg daily.  FMJ:Rnwupwlz spironolactone  25 mg daily  Cardiometabolic:SGLT2i led to yeast infection. Will continue to hold as she is now on immunosuppressives. Devices therapies & Valvulopathies: Drop in LVEF to 30-35% however improvement on PET to 40%. Repeat TTE 04/2024 EF 30-35%, referred to EP for consideration of ICD. Had visit to discuss with EP on 06/19/24, ICD implant scheduled for 07/27/24.  Advanced therapies:not indicated   2. Cardiac sarcoid - Vaccinations confirmed.  - PET from 02/09/23 w/ LVEF 37% - Currently on cellcept  1500 mg BID.  - Restarted on prednisone  20mg  daily on 02/15/24.  - Continue bactrim  while on prednisone  doses >20 mg daily (3x weekly) - Continue PPI - Repeat PET scan in 04/13/24 with improvement in FDG uptake; however, FDG still present in the apical anterior and apex.  - Rescheduled cardiac PET for 07/26/24. Will not make changes on immunosuppressive regimen until repeat PET is completed.   3. Obesity - Body mass index is 35.41 kg/m. - Discussed side effects of prednisone  on weight and glucose.  - Stable; discussed importance of continued activity.    4. OSA - sleep study pending; needs split night sleep study.  - will schedule at follow up.    5. Moderate to severe MR - Moderate by TTE in 01/19/24.       Follow up 2 weeks with Dr. Glendell Tinnie Tanner, PharmD, BCPS, [facility], CPP Heart Failure Clinic Pharmacist [phone]

## 2024-07-04 ENCOUNTER — Other Ambulatory Visit (HOSPITAL_COMMUNITY): Payer: Self-pay | Admitting: Cardiology

## 2024-07-04 DIAGNOSIS — D8685 Sarcoid myocarditis: Secondary | ICD-10-CM

## 2024-07-06 ENCOUNTER — Encounter (HOSPITAL_COMMUNITY): Admission: RE | Admit: 2024-07-06 | Source: Ambulatory Visit

## 2024-07-11 ENCOUNTER — Ambulatory Visit (HOSPITAL_COMMUNITY): Payer: Self-pay | Admitting: Pharmacist

## 2024-07-11 ENCOUNTER — Ambulatory Visit (HOSPITAL_COMMUNITY)
Admission: RE | Admit: 2024-07-11 | Discharge: 2024-07-11 | Disposition: A | Discharge status: Home / Self Care | Source: Ambulatory Visit | Attending: Cardiology | Admitting: Cardiology

## 2024-07-11 VITALS — BP 142/76 | HR 71 | Wt 194.2 lb

## 2024-07-11 DIAGNOSIS — E669 Obesity, unspecified: Secondary | ICD-10-CM | POA: Insufficient documentation

## 2024-07-11 DIAGNOSIS — Z79899 Other long term (current) drug therapy: Secondary | ICD-10-CM | POA: Diagnosis not present

## 2024-07-11 DIAGNOSIS — Z6835 Body mass index (BMI) 35.0-35.9, adult: Secondary | ICD-10-CM | POA: Insufficient documentation

## 2024-07-11 DIAGNOSIS — Z853 Personal history of malignant neoplasm of breast: Secondary | ICD-10-CM | POA: Insufficient documentation

## 2024-07-11 DIAGNOSIS — Z8674 Personal history of sudden cardiac arrest: Secondary | ICD-10-CM | POA: Insufficient documentation

## 2024-07-11 DIAGNOSIS — G4733 Obstructive sleep apnea (adult) (pediatric): Secondary | ICD-10-CM | POA: Diagnosis not present

## 2024-07-11 DIAGNOSIS — I252 Old myocardial infarction: Secondary | ICD-10-CM | POA: Diagnosis not present

## 2024-07-11 DIAGNOSIS — I255 Ischemic cardiomyopathy: Secondary | ICD-10-CM | POA: Diagnosis not present

## 2024-07-11 DIAGNOSIS — I110 Hypertensive heart disease with heart failure: Secondary | ICD-10-CM | POA: Diagnosis present

## 2024-07-11 DIAGNOSIS — I5022 Chronic systolic (congestive) heart failure: Secondary | ICD-10-CM | POA: Insufficient documentation

## 2024-07-11 DIAGNOSIS — Z86718 Personal history of other venous thrombosis and embolism: Secondary | ICD-10-CM | POA: Insufficient documentation

## 2024-07-11 DIAGNOSIS — Z955 Presence of coronary angioplasty implant and graft: Secondary | ICD-10-CM | POA: Insufficient documentation

## 2024-07-11 DIAGNOSIS — I2510 Atherosclerotic heart disease of native coronary artery without angina pectoris: Secondary | ICD-10-CM | POA: Insufficient documentation

## 2024-07-11 DIAGNOSIS — E119 Type 2 diabetes mellitus without complications: Secondary | ICD-10-CM | POA: Insufficient documentation

## 2024-07-11 DIAGNOSIS — D8685 Sarcoid myocarditis: Secondary | ICD-10-CM | POA: Diagnosis not present

## 2024-07-11 DIAGNOSIS — I340 Nonrheumatic mitral (valve) insufficiency: Secondary | ICD-10-CM | POA: Insufficient documentation

## 2024-07-11 LAB — CBC WITH DIFFERENTIAL/PLATELET
Abs Immature Granulocytes: 0.04 K/uL (ref 0.00–0.07)
Basophils Absolute: 0 K/uL (ref 0.0–0.1)
Basophils Relative: 0 %
Eosinophils Absolute: 0.1 K/uL (ref 0.0–0.5)
Eosinophils Relative: 1 %
HCT: 37.9 % (ref 36.0–46.0)
Hemoglobin: 11.9 g/dL — ABNORMAL LOW (ref 12.0–15.0)
Immature Granulocytes: 1 %
Lymphocytes Relative: 27 %
Lymphs Abs: 2.1 K/uL (ref 0.7–4.0)
MCH: 27.3 pg (ref 26.0–34.0)
MCHC: 31.4 g/dL (ref 30.0–36.0)
MCV: 86.9 fL (ref 80.0–100.0)
Monocytes Absolute: 0.9 K/uL (ref 0.1–1.0)
Monocytes Relative: 11 %
Neutro Abs: 4.7 K/uL (ref 1.7–7.7)
Neutrophils Relative %: 60 %
Platelets: 322 K/uL (ref 150–400)
RBC: 4.36 MIL/uL (ref 3.87–5.11)
RDW: 13.5 % (ref 11.5–15.5)
WBC: 7.8 K/uL (ref 4.0–10.5)
nRBC: 0 % (ref 0.0–0.2)

## 2024-07-11 LAB — BASIC METABOLIC PANEL WITH GFR
Anion gap: 9 (ref 5–15)
BUN: 12 mg/dL (ref 8–23)
CO2: 26 mmol/L (ref 22–32)
Calcium: 9.6 mg/dL (ref 8.9–10.3)
Chloride: 104 mmol/L (ref 98–111)
Creatinine, Ser: 0.8 mg/dL (ref 0.44–1.00)
GFR, Estimated: >60 mL/min (ref >60)
Glucose, Bld: 93 mg/dL (ref 70–99)
Potassium: 4.4 mmol/L (ref 3.5–5.1)
Sodium: 139 mmol/L (ref 135–145)

## 2024-07-11 NOTE — Patient Instructions (Addendum)
 It was a pleasure seeing you today!  MEDICATIONS: -No medication changes today -Call if you have questions about your medications.  LABS: -We will call you if your labs need attention.  NEXT APPOINTMENT: Return to clinic in 2 weeks with Dr. Gardenia.   Call the clinic at [phone] with questions or to reschedule future appointments.    Cardiac Sarcoidosis/Inflammation PET Scan Patient Instructions   Please report to Radiology at the [facility] Main Entrance 15 minutes early for your test.       [address] [place], [place] [address]   BRING FOOD DIARY WITH YOU TO THIS APPOINTMENT   For 24 hours before the test: Do not exercise!   Do not eat after 5 pm the day before your test!   To make sure that your scanning results are accurate, you MUST follow the sarcoid prep meal diet starting the day before your PET scan. This diet involves eating no carbohydrates 24 hours before the test.    You will keep a log of all that you eat the day before your test. If you have questions or do not understand this diet, please call [phone] for more information. If you are unable to follow this diet, please discuss an alternative strategy with the coordinator.   If you are diabetic, continue your diabetes medications as usual on the day before until you begin to fast. NO DIABETES MEDICATIONS ONCE YOU BEGIN TO FAST.   On the morning of your test, please wait until after the test is completed to take your morning medications.)   What foods can I eat the day before my test?  Drink only water or black coffee (WITHOUT sugar, artificial sweetener, cream, or milk). Eggs (prepared without milk or cheese)  Meat that is either broiled or pan fried in butter WITHOUT breading (chicken, [place], bacon, meat-only sausage, hamburger, steak, fish) Butter, salt & pepper What foods must I AVOID the day before my test?  Do not consume alcoholic beverages, sodas, fruit juice, coffee  creamer, or sports drinks  Do not eat vegetables, beans, nuts, fruits, juices, bread, grains, rice, pasta, potatoes, or any baked goods Do not eat dairy products (milk, cheese, etc.)  Do not eat mayonnaise, ketchup, tartar sauce, mustard, or other condiments Do not add sugar, artificial sweeteners, or Splenda (sucralose) to foods or drinks  Do not eat breaded foods (like fried chicken)  Do not eat sweets, candy, gum, sweetened cough drops, lozenges, or sugar  Do not eat sweetened, grilled, or cured meats or meat with carbohydrate-containing additives (some sausages, ham, sweetened bacon) Suggested items for breakfast, lunch, or dinner:      Breakfast  3 to 5 fatty sausage links fried in butter. 3 to 5 bacon strips.   3 eggs pan fried in butter (no milk or cheese).  Lunch/Dinner  2 hamburger patties fried in butter. Chicken or fatty fish pan fried in butter. No breading. 8 oz. fatty steak pan fried in butter.  Beverages  Drink only water or black coffee. DO NOT ADD SUGAR, ARTIFICIAL SWEETENER, CREAM, OR MILK  Please call [facility] to cancel or reschedule your appointment at [phone] For more information and frequently asked questions, please visit our website : [email or web address]    Cardiac Sarcoidosis/Inflammation PET Scan  Food Diary Name: _____________________________ Please fill in EXACTLY what you have eaten and when for 24 hours PRIOR to your test date.  Time Food/Drink Comments  Breakfast  Lunch                           Dinner                           Snacks                             DO NOT EXERCISE THE DAY BEFORE YOUR TEST DO NOT EAT AFTER 5 PM THE DAY BEFORE YOUR TEST.  ON THE DAY OF YOUR TEST, DO NOT EAT ANY FOOD AND ONLY DRINK CLEAR WATER! PLEASE BRING THIS FOOD DIARY WITH YOU TO YOUR APPOINTMENT

## 2024-07-12 ENCOUNTER — Telehealth (HOSPITAL_COMMUNITY): Payer: Self-pay

## 2024-07-16 ENCOUNTER — Other Ambulatory Visit (HOSPITAL_BASED_OUTPATIENT_CLINIC_OR_DEPARTMENT_OTHER): Payer: Self-pay | Admitting: Cardiovascular Disease

## 2024-07-17 ENCOUNTER — Ambulatory Visit

## 2024-07-17 VITALS — BP 98/55 | HR 94 | Temp 97.4°F | Ht 62.0 in | Wt 194.6 lb

## 2024-07-17 DIAGNOSIS — G4733 Obstructive sleep apnea (adult) (pediatric): Secondary | ICD-10-CM

## 2024-07-17 DIAGNOSIS — D860 Sarcoidosis of lung: Secondary | ICD-10-CM

## 2024-07-17 DIAGNOSIS — Z72820 Sleep deprivation: Secondary | ICD-10-CM

## 2024-07-17 DIAGNOSIS — Z6835 Body mass index (BMI) 35.0-35.9, adult: Secondary | ICD-10-CM | POA: Diagnosis not present

## 2024-07-17 NOTE — Progress Notes (Signed)
 New Patient Pulmonology Office Visit   Subjective:  Patient ID: Marie Tanner, female    DOB: 12-Aug-1956  MRN: [id number]  Referred by: Delores Rojelio Caldron, NP  CC: No chief complaint on file.   HPI Marie Tanner is a 68 y.o. female with CAD status post PCI [V-fib arrest], ischemic CMP, provoked DVT, mod-sev MR, cardiac sarcoidosis [on CellCept  and prednisone , follows ophthalmology yearly. Has close cardiology follow up], OSA, GERD, DM2, breast cancer, prior trach. Follows cardiology.  Here for concern for OSA.   Dx w OSA  before 2015. Had CPAP then. Had in lab in 2017 w/o OSA seen. Had lost significant weight and was close to 165 lbs then. Currently at 194 lb.  H/o depression but now much better.   ETT 1-2 blocks.  Used to run 5K prior.  Sometimes chest tightness but not periodically.  No chest pain.   Recent referral for ICD insertion plan for it next thursday  Mouth breather: no Preferred sleeping position: side  Sleep related Symptoms:  Snoring- occasional Witnessed apnea- no Gasping/choking- no morning HA/dry mouth- yes tired on awakening, excessive daytime sleepiness- yes Restless legs- no Sleep talks. But no other parasomnia. Do dream but no REM enactment.   Sleep routine:  -Bed: 10.30-11 p. Prior to that is working in her kitchen. Onset in 10 min.  -Nocturnal awakenings: 2 times.able to fall back to sleep.  -Wake: 4-5 a. W/o alarm.  -Napping:sometimes will take a 30 min nap.  -perceived sleep time: 4-5 hrs/night. May be able to fall asleep earlier or sleep in later.  -sleep hygiene: occasional watch TV.   Habits: -Caffeine: decaf -Alcohol: no -Recreational drugs: no  -Exercise: no  Social: never smoked. 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   PRIOR TESTS and IMAGING: In lab in 2017: Did not meet criteria for titration.  Predominantly supine sleep.  AHI 3.5, RDI 10.2.  No significant desaturation.  O2 nadir  84. PET scan March 2024: Active inflammation/sarcoidosis.  EF 37%. Echo: June 2025: EF 30-35.  Grade 1 diastolic dysfunction. CT chest January 2025: Reviewed by me.  No PE.  Right upper lobe and right middle lobe scarring.  Likely related to sarcoid.  No bulky lymphadenopathy.  Some scattered cyst and mosaic attenuation.     07/17/2024    2:00 PM  Results of the Epworth flowsheet  Sitting and reading 3  Watching TV 2  Sitting, inactive in a public place (e.g. a theatre or a meeting) 1  As a passenger in a car for an hour without a break 1  Lying down to rest in the afternoon when circumstances permit 3  Sitting and talking to someone 1  Sitting quietly after a lunch without alcohol 1  In a car, while stopped for a few minutes in traffic 1  Total score 13    Allergies: Dilaudid [hydromorphone hcl], Epinephrine , Lactose intolerance (gi), Tilactase, Jardiance  [empagliflozin ], and Shellfish allergy  Current Outpatient Medications:    acetaminophen  (TYLENOL ) 500 MG tablet, Take 1,000 mg by mouth every 6 (six) hours as needed for moderate pain., Disp: , Rfl:    aspirin  EC 81 MG tablet, Take 1 tablet (81 mg total) by mouth daily., Disp: , Rfl:    blood glucose meter kit and supplies KIT, by Does not apply route daily as needed. Dispense based on patient and insurance preference. Use up to four times daily as directed. (FOR ICD-9 250.00, 250.01)., Disp: , Rfl:  calcium -vitamin D (OSCAL WITH D) 500-5 MG-MCG tablet, Take 1 tablet by mouth 2 (two) times daily., Disp: , Rfl:    cetirizine (ZYRTEC) 10 MG tablet, Take 10 mg by mouth as needed for allergies., Disp: , Rfl:    Dulaglutide  (TRULICITY ) 4.5 MG/0.5ML SOPN, Inject 4.5 mg as directed once a week., Disp: 2 mL, Rfl: 11   Evolocumab  (REPATHA  SURECLICK) 140 MG/ML SOAJ, INJECT 140 MG INTO THE SKIN EVERY 14 (FOURTEEN) DAYS., Disp: 6 mL, Rfl: 3   fluticasone  (FLONASE ) 50 MCG/ACT nasal spray, Place 1 spray into both nostrils as needed for  allergies., Disp: , Rfl:    furosemide  (LASIX ) 40 MG tablet, TAKE 1 TABLET ONCE PER WEEK WITH ADDITIONAL TABLET AS NEEDED FOR SHORTNESS OF BREATH OR WEIGHT GAIN OF 2 POUNDS IN 24 HOURS OR 5 POUNDS IN 1 WEEK, Disp: 30 tablet, Rfl: 3   gabapentin (NEURONTIN) 300 MG capsule, Take 300 mg by mouth at bedtime., Disp: , Rfl:    glucose blood test strip, Use as instructed. E11.5. To check blood glucose daily., Disp: 100 each, Rfl: 12   ketoconazole  (NIZORAL ) 2 % cream, Apply to both feet and between toes once daily., Disp: 60 g, Rfl: 1   magnesium  oxide (MAG-OX) 400 MG tablet, TAKE 1 TABLET BY MOUTH EVERY DAY, Disp: 90 tablet, Rfl: 0   metoprolol  succinate (TOPROL -XL) 25 MG 24 hr tablet, Take 1 tablet (25 mg total) by mouth daily. Take with or immediately following a meal., Disp: 90 tablet, Rfl: 3   mycophenolate  (CELLCEPT ) 500 MG tablet, TAKE 3 TABLETS (1500 MG) BY MOUTH TWICE A DAY, Disp: 540 tablet, Rfl: 1   nitroGLYCERIN  (NITROSTAT ) 0.4 MG SL tablet, Place 1 tablet (0.4 mg total) under the tongue every 5 (five) minutes as needed for chest pain. max of 3 if no relief call 911, Disp: 25 tablet, Rfl: PRN   pantoprazole  (PROTONIX ) 40 MG tablet, Take 1 tablet (40 mg total) by mouth daily., Disp: 30 tablet, Rfl: 5   predniSONE  (DELTASONE ) 20 MG tablet, TAKE 1 TABLET BY MOUTH DAILY WITH BREAKFAST, Disp: 30 tablet, Rfl: 3   rosuvastatin  (CRESTOR ) 20 MG tablet, Take 1 tablet by mouth 3 times per week, Disp: 40 tablet, Rfl: 5   sacubitril -valsartan  (ENTRESTO ) 97-103 MG, Take 1 tablet by mouth 2 (two) times daily., Disp: 180 tablet, Rfl: 3   spironolactone  (ALDACTONE ) 25 MG tablet, Take 1 tablet (25 mg total) by mouth daily., Disp: 30 tablet, Rfl: 11   sulfamethoxazole -trimethoprim  (BACTRIM  DS) 800-160 MG tablet, TAKE 1 TABLET BY MOUTH 3 (THREE) TIMES A WEEK. TAKE ON MONDAYS, WEDNESDAYS, AND FRIDAYS., Disp: 12 tablet, Rfl: 3   ticagrelor  (BRILINTA ) 60 MG TABS tablet, Take 1 tablet (60 mg total) by mouth 2 (two)  times daily., Disp: 180 tablet, Rfl: 3   triamcinolone  cream (KENALOG ) 0.1 %, Apply 1 Application topically daily., Disp: , Rfl:  Past Medical History:  Diagnosis Date   Breast cancer (HCC)    Cancer (HCC)    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   GERD (gastroesophageal reflux disease)    HCAP (healthcare-associated pneumonia) 11/22/2014   Heart murmur    High cholesterol    History of blood transfusion 09/2014   related to MI   History of gout    Hypertension    Liver laceration 10/19/2014   Palpitations  12/12/2020   Pulmonary edema 10/19/2014   Renal disorder    Renal disorder    Right hip pain 04/03/2021   Sleep apnea    suppose to wear mask but I don't (11/22/2014)   STEMI (ST elevation myocardial infarction) (HCC) 10/19/2014   Type II diabetes mellitus (HCC)    Urticaria    Past Surgical History:  Procedure Laterality Date   ABDOMINAL HYSTERECTOMY  ~ 2000   APPLICATION OF WOUND VAC  10/2014   over my naval (11/22/2014)   BREAST LUMPECTOMY Right 2017   BREAST LUMPECTOMY WITH AXILLARY LYMPH NODE BIOPSY     BREAST SURGERY     CESAREAN SECTION  1980; 1982   CORONARY ANGIOPLASTY WITH STENT PLACEMENT  09/2014   multiple/notes 11/22/2014   EXTRACORPOREAL CIRCULATION     Femerol artery repair Right 09/2014   pt insists it was a right femoral artery repair on 11/22/2014   LAPAROSCOPIC ABDOMINAL EXPLORATION     LEFT HEART CATH AND CORONARY ANGIOGRAPHY N/A 12/07/2017   Procedure: LEFT HEART CATH AND CORONARY ANGIOGRAPHY;  Surgeon: Anner Alm ORN, MD;  [facility] CV LAB;  Service: Cardiovascular;  Laterality: N/A;   LEFT VENTRICULAR ASSIST DEVICE     pt is not aware of this hx on 11/22/2014   Liver laceration repair  09/2014   related to CPR/notes 11/22/2014   RIGHT/LEFT HEART  CATH AND CORONARY ANGIOGRAPHY N/A 04/21/2021   Procedure: RIGHT/LEFT HEART CATH AND CORONARY ANGIOGRAPHY;  Surgeon: Mady Bruckner, MD;  Location: [facility] INVASIVE CV LAB;  Service: Cardiovascular;  Laterality: N/A;   TEE WITHOUT CARDIOVERSION N/A 03/13/2021   Procedure: TRANSESOPHAGEAL ECHOCARDIOGRAM (TEE);  Surgeon: Loni Soyla LABOR, MD;  Location: [facility] ENDOSCOPY;  Service: Cardiology;  Laterality: N/A;   TRACHEOSTOMY  09/2014   closed on it's own when they took it out   TUBAL LIGATION  1982   Family History  Problem Relation Age of Onset   Diabetes Mother    Hypertension Mother    COPD Mother    Heart disease Father    Heart attack Father    Alzheimer's disease Father    Diabetes Brother    Asthma Daughter    Stroke Neg Hx    Colon cancer Neg Hx    Stomach cancer Neg Hx    Pancreatic cancer Neg Hx    AAA (abdominal aortic aneurysm) Neg Hx    Social History   Socioeconomic History   Marital status: Divorced    Spouse name: Not on file   Number of children: 2   Years of education: Not on file   Highest education level: Not on file  Occupational History   Not on file  Tobacco Use   Smoking status: Never   Smokeless tobacco: Never  Vaping Use   Vaping status: Never Used  Substance and Sexual Activity   Alcohol use: Never   Drug use: No   Sexual activity: Not Currently  Other Topics Concern   Not on file  Social History Narrative   Not on file   Social Drivers of Health   Financial Resource Strain: Not on File (03/19/2022)   Received from [employer]    Financial Resource Strain: 0  Food Insecurity: Declined (04/06/2023)   Received from [facility] Insecurity    Food: 99  Transportation Needs: Not at Risk (04/06/2023)   Received from [employer] Needs    Transportation: 1  Physical Activity: Not on File (  03/19/2022)   Received from [facility]   Physical Activity    Physical Activity: 0  Stress: Not on File (03/19/2022)   Received  from [facility]   Stress    Stress: 0  Social Connections: Not on File (08/04/2023)   Received from [employer]   Social Connections    Connectedness: 0  Intimate Partner Violence: Not on file       Objective:  BP (!) 98/55   Pulse 94   Temp (!) 97.4 F (36.3 C) (Temporal)   Ht 5' 2 (1.575 m)   Wt 194 lb 9.6 oz (88.3 kg)   SpO2 98%   BMI 35.59 kg/m  BMI Readings from Last 3 Encounters:  07/17/24 35.59 kg/m  07/11/24 35.52 kg/m  06/19/24 35.48 kg/m    Physical Exam: CONSTITUTIONAL: NAD, well-appearing NASAL/OROPHARYNX:  Normal mucosa. No septal deviation. No hypertrophy of inferior turbinates. Modified Mallampati score 4.  No tonsillar enlargement.  CV: RRR s1s2 nl, no murmurs  RESP: Clear to auscultation, normal respiratory effort   NEURO: CN II/XII grossly intact PSYCH: Alert & oriented x 3, Euthymic, appropriate affect  Diagnostic Review:  Last metabolic panel Lab Results  Component Value Date   GLUCOSE 93 07/11/2024   NA 139 07/11/2024   K 4.4 07/11/2024   CL 104 07/11/2024   CO2 26 07/11/2024   BUN 12 07/11/2024   CREATININE 0.80 07/11/2024   GFRNONAA >60 07/11/2024   CALCIUM  9.6 07/11/2024   PROT 6.7 05/09/2024   ALBUMIN 3.5 05/09/2024   LABGLOB 3.8 11/26/2022   AGRATIO 1.2 11/26/2022   BILITOT 0.8 05/09/2024   ALKPHOS 54 05/09/2024   AST 16 05/09/2024   ALT 15 05/09/2024   ANIONGAP 9 07/11/2024       Assessment & Plan:   Assessment & Plan OSA (obstructive sleep apnea) Prior history of sleep apnea.  However subsequently there was no sleep apnea after she lost weight.  Now has regained weight.  Will get an in lab sleep study because of history of systolic congestive heart failure raising concerns for central sleep apnea. Sleep deprivation Advised increasing time and bed by 30 minutes by going to bed 30 minutes earlier.  If able to fall asleep ASAP after going to bed again can further increase her sleep zone . Pulmonary sarcoidosis (HCC) Chronic changes  of pulmonary sarcoidosis seen on CT chest.  PET scan without active pulmonary sarcoidosis.  However mosaic attenuation and cystic changes seen on CT scan.  Will get PFTs with baseline shortness of breath. Morbid (severe) obesity due to excess calories (HCC) Likely related to steroid use.  Will consider GLP-1 in future visit.  Orders Placed This Encounter  Procedures   Pulmonary function test   Nocturnal polysomnography    She was counselled about not driving while drowsy which is common side effect of sleep related disorders.   Return for sch after PFTs, 1 month After Sleep study.   Ledell Codrington, MD

## 2024-07-17 NOTE — Patient Instructions (Addendum)
 Notification of test results are managed in the following manner: If there are any recommendations or changes to the plan of care discussed in office today, we will contact you and let you know what they are. If you do not hear from us , then your results are normal/expected and you can view them through your MyChart account, or a letter will be sent to you. Thank you again for trusting us  with your care [facility].   Sleep apnea: we will get in lab sleep study.   Sleep deprivation: go to bed 30 min before. Try to go to bed earlier by 30 min every few days if able to fall asleep fast once in bed.

## 2024-07-20 ENCOUNTER — Telehealth: Payer: Self-pay

## 2024-07-20 ENCOUNTER — Telehealth: Payer: Self-pay | Admitting: Cardiovascular Disease

## 2024-07-20 NOTE — Telephone Encounter (Signed)
 Returned pt's call - she requested that I resend her Instruction letter via MyChart. She couldn't find it. I sent it to her as a pt message so she could find it easier.   I went over medication Instructions while on the phone with her and she is aware of them and understands all instructions for her ICD Implant with Dr. Waddell on 8/28.

## 2024-07-20 NOTE — Telephone Encounter (Signed)
 Patient calling in because instruction for he surgery on 8/28 is needed again. Please advise

## 2024-07-20 NOTE — Telephone Encounter (Signed)
 This pt is scheduled to have an ICD Implant with Dr. Waddell next Thursday - She is having a Crown put on at her Dentist on Tuesday and she wants to make sure that won't interfere with her procedure.   I told her I didn't think it would but I would ask.

## 2024-07-20 NOTE — Telephone Encounter (Signed)
 I will forward to Dr. Waddell to double check. If Dr. Waddell gives ok, I will call to follow up w/ patient.

## 2024-07-21 ENCOUNTER — Telehealth (HOSPITAL_COMMUNITY): Payer: Self-pay

## 2024-07-21 ENCOUNTER — Ambulatory Visit

## 2024-07-21 DIAGNOSIS — D860 Sarcoidosis of lung: Secondary | ICD-10-CM

## 2024-07-21 LAB — PULMONARY FUNCTION TEST
DL/VA % pred: 111 %
DL/VA: 4.73 ml/min/mmHg/L
DLCO cor % pred: 79 %
DLCO cor: 14.65 ml/min/mmHg
DLCO unc % pred: 75 %
DLCO unc: 13.93 ml/min/mmHg
FEF 25-75 Post: 2.65 L/s
FEF 25-75 Pre: 2.03 L/s
FEF2575-%Change-Post: 30 %
FEF2575-%Pred-Post: 131 %
FEF2575-%Pred-Pre: 100 %
FEV1-%Change-Post: 10 %
FEV1-%Pred-Post: 79 %
FEV1-%Pred-Pre: 72 %
FEV1-Post: 1.77 L
FEV1-Pre: 1.61 L
FEV1FVC-%Change-Post: 2 %
FEV1FVC-%Pred-Pre: 110 %
FEV6-%Change-Post: 7 %
FEV6-%Pred-Post: 72 %
FEV6-%Pred-Pre: 67 %
FEV6-Post: 2.03 L
FEV6-Pre: 1.88 L
FEV6FVC-%Pred-Post: 104 %
FEV6FVC-%Pred-Pre: 104 %
FVC-%Change-Post: 7 %
FVC-%Pred-Post: 69 %
FVC-%Pred-Pre: 64 %
FVC-Post: 2.03 L
FVC-Pre: 1.88 L
Post FEV1/FVC ratio: 87 %
Post FEV6/FVC ratio: 100 %
Pre FEV1/FVC ratio: 85 %
Pre FEV6/FVC Ratio: 100 %
RV % pred: 55 %
RV: 1.11 L
TLC % pred: 77 %
TLC: 3.67 L

## 2024-07-21 NOTE — Telephone Encounter (Signed)
 Spoke with patient to discuss upcoming procedure.   Confirmed patient is scheduled for a Implantable cardioverter defibrilator (ICD) on Thursday, August 28 with Dr. Danelle Birmingham. Instructed patient to arrive at the Main Entrance A at [facility]: [address] [place], [place] [address] and check in at Admitting at 7:30 AM.   Labs completed  Any recent signs of acute illness or been started on antibiotics? No Any new medications started? No Any medications to hold? Hold Trulicity  for 7 days prior to your procedure. Patient took last dose on August 18. Hold aspirin  and Brilinta  for 2 days prior to your procedure. Your last dose will be on August 25. Medication instructions:  On the morning of your procedure ONLY take Cellcept  with a small sip of water. No eating or drinking after midnight prior to procedure.   The night before your procedure and the morning of your procedure, wash thoroughly with the CHG surgical soap from the neck down, paying special attention to the area where your procedure will be performed.  Advised of plan to go home the same day and will only stay overnight if medically necessary. You MUST have a responsible adult to drive you home and MUST be with you the first 24 hours after you arrive home.  Patient verbalized understanding to all instructions provided and agreed to proceed with procedure.

## 2024-07-21 NOTE — Progress Notes (Signed)
 Full PFT performed today.

## 2024-07-21 NOTE — Patient Instructions (Signed)
 Full PFT performed today.

## 2024-07-22 ENCOUNTER — Other Ambulatory Visit (HOSPITAL_COMMUNITY): Payer: Self-pay | Admitting: Cardiology

## 2024-07-24 ENCOUNTER — Telehealth (HOSPITAL_COMMUNITY): Payer: Self-pay | Admitting: Emergency Medicine

## 2024-07-24 NOTE — Telephone Encounter (Signed)
 Called pt to let her know Dr. Waddell gives ok to continue to crown on tooth. Patient was appreciative of call back.

## 2024-07-24 NOTE — Telephone Encounter (Signed)
Attempted to call patient regarding upcoming cardiac PET appointment. Left message on voicemail with name and callback number Aidan Moten RN Navigator Cardiac Imaging [facility] [phone] Office [phone] Cell  

## 2024-07-25 ENCOUNTER — Telehealth: Payer: Self-pay | Admitting: Cardiovascular Disease

## 2024-07-25 NOTE — Telephone Encounter (Signed)
 Patient states she was prescribed cephalexin 500mg  to take after her dental procedure. She has not started it yet because she wanted to check to see if it would be OK with her upcoming procedure (ICD implant on 8/28).  Will forward to Dr. Waddell to review and advise.

## 2024-07-25 NOTE — Telephone Encounter (Signed)
 Pt states she is currently on an ABT due to dental procedure yesterday. Pt would like to know if she is able to continue ABT due to upcoming procedure on 8/28. Please advise

## 2024-07-26 ENCOUNTER — Ambulatory Visit (HOSPITAL_COMMUNITY)
Admission: RE | Admit: 2024-07-26 | Discharge: 2024-07-26 | Disposition: A | Discharge status: Home / Self Care | Source: Ambulatory Visit | Attending: Cardiology | Admitting: Cardiology

## 2024-07-26 ENCOUNTER — Encounter (HOSPITAL_COMMUNITY): Payer: Self-pay | Admitting: Cardiology

## 2024-07-26 ENCOUNTER — Encounter (HOSPITAL_COMMUNITY)
Admission: RE | Admit: 2024-07-26 | Discharge: 2024-07-26 | Disposition: A | Discharge status: Home / Self Care | Source: Ambulatory Visit | Attending: Cardiology | Admitting: Cardiology

## 2024-07-26 ENCOUNTER — Ambulatory Visit: Payer: Self-pay

## 2024-07-26 VITALS — BP 110/64 | HR 82 | Wt 194.0 lb

## 2024-07-26 DIAGNOSIS — D849 Immunodeficiency, unspecified: Secondary | ICD-10-CM

## 2024-07-26 DIAGNOSIS — K8020 Calculus of gallbladder without cholecystitis without obstruction: Secondary | ICD-10-CM | POA: Insufficient documentation

## 2024-07-26 DIAGNOSIS — Z9861 Coronary angioplasty status: Secondary | ICD-10-CM

## 2024-07-26 DIAGNOSIS — I700 Atherosclerosis of aorta: Secondary | ICD-10-CM | POA: Diagnosis not present

## 2024-07-26 DIAGNOSIS — I472 Ventricular tachycardia, unspecified: Secondary | ICD-10-CM

## 2024-07-26 DIAGNOSIS — I2510 Atherosclerotic heart disease of native coronary artery without angina pectoris: Secondary | ICD-10-CM | POA: Diagnosis not present

## 2024-07-26 DIAGNOSIS — D8685 Sarcoid myocarditis: Secondary | ICD-10-CM | POA: Insufficient documentation

## 2024-07-26 DIAGNOSIS — E669 Obesity, unspecified: Secondary | ICD-10-CM | POA: Diagnosis not present

## 2024-07-26 LAB — NM PET CT MYOCARDIAL SARCOIDOSIS
Nuc Stress EF: 35 %
Rest Nuclear Isotope Dose: 23.2 mCi

## 2024-07-26 MED ORDER — FLUDEOXYGLUCOSE F - 18 (FDG) INJECTION
8.2000 | Freq: Once | INTRAVENOUS | Status: AC
  Administered 2024-07-26: 8.2 via INTRAVENOUS

## 2024-07-26 MED ORDER — RUBIDIUM RB82 GENERATOR (RUBYFILL)
23.1800 | PACK | Freq: Once | INTRAVENOUS | Status: AC
  Administered 2024-07-26: 23.18 via INTRAVENOUS

## 2024-07-26 NOTE — Pre-Procedure Instructions (Signed)
Attempted to call patient regarding procedure instructions.  Left voice mail on the following items: Arrival time 0730 Nothing to eat or drink after midnight No meds AM of procedure Responsible person to drive you home and stay with you for 24 hrs Wash with special soap night before and morning of procedure  

## 2024-07-26 NOTE — Progress Notes (Signed)
 ADVANCED HEART FAILURE CLINIC NOTE  Referring Physician: Delores Rojelio Caldron, NP  Primary Care: Delores Rojelio Caldron, NP Primary Cardiologist: Dr. Gardenia  HPI: Marie Tanner is a 68 y.o. female with coronary artery disease status post STEMI, hypertension, chronic systolic heart failure, obstructive sleep apnea not on CPAP, type 2 diabetes, history of DVT, breast cancer and cardiac sarcoid presenting today to establish care.   It appears that Marie Tanner's cardiac history dates back to 2015 when she had a STEMI complicated by cardiac arrest at [facility].  She received 2 stents to the left main/LAD and circumflex.  During this time she had a prolonged hospitalization requiring tracheostomy; liver laceration during CPR which required surgical repair and also developed a DVT during this time.  She underwent repeat left heart cath in January 2019 that demonstrated patent stents which were heavily calcified with other areas of nonobstructive CAD.  EF at that time was 35 to 45%.  With increasing GDMT she had improvement in EF by late 2019 to 40 to 50%.  Due to moderate to severe eccentric mitral regurgitation she had a TEE in 2022 and repeat right and left heart cath with structural evaluation in May 2022 that showed patent left main and LAD stents normal filling pressures and a normal transmitral gradient.  She was deferred for medical management only.  She had a cardiac MRI with noncoronary distribution of LGE followed by a cardiac PET that showed FDG uptake consistent with active inflammation/sarcoid. Patient now undergoing treatment for cardiac sarcoid.   Interval hx:  - Overall she has been doing fairly well from a symptomatic standpoint; She continues to cook, clean and remains very active with her church without many issues.  - No PND, orthopnea or lightheadedness.  - Compliant with all medications & immunosuppression; no clinical signs of infection.    Current Outpatient Medications   Medication Sig Dispense Refill   acetaminophen  (TYLENOL ) 500 MG tablet Take 1,000 mg by mouth every 6 (six) hours as needed for moderate pain.     aspirin  EC 81 MG tablet Take 1 tablet (81 mg total) by mouth daily.     blood glucose meter kit and supplies KIT by Does not apply route daily as needed. Dispense based on patient and insurance preference. Use up to four times daily as directed. (FOR ICD-9 250.00, 250.01).     BRILINTA  60 MG TABS tablet TAKE 1 TABLET BY MOUTH 2 TIMES DAILY. 180 tablet 3   calcium -vitamin D (OSCAL WITH D) 500-5 MG-MCG tablet Take 1 tablet by mouth 2 (two) times daily.     Dulaglutide  (TRULICITY ) 4.5 MG/0.5ML SOPN Inject 4.5 mg as directed once a week. 2 mL 11   Evolocumab  (REPATHA  SURECLICK) 140 MG/ML SOAJ INJECT 140 MG INTO THE SKIN EVERY 14 (FOURTEEN) DAYS. 6 mL 3   fluticasone  (FLONASE ) 50 MCG/ACT nasal spray Place 1 spray into both nostrils as needed for allergies.     furosemide  (LASIX ) 40 MG tablet TAKE 1 TABLET ONCE PER WEEK WITH ADDITIONAL TABLET AS NEEDED FOR SHORTNESS OF BREATH OR WEIGHT GAIN OF 2 POUNDS IN 24 HOURS OR 5 POUNDS IN 1 WEEK 30 tablet 3   gabapentin (NEURONTIN) 300 MG capsule Take 300 mg by mouth at bedtime.     glucose blood test strip Use as instructed. E11.5. To check blood glucose daily. 100 each 12   ketoconazole  (NIZORAL ) 2 % cream Apply to both feet and between toes once daily. 60 g 1   magnesium  oxide (  MAG-OX) 400 MG tablet TAKE 1 TABLET BY MOUTH EVERY DAY 90 tablet 0   metoprolol  succinate (TOPROL -XL) 25 MG 24 hr tablet Take 1 tablet (25 mg total) by mouth daily. Take with or immediately following a meal. 90 tablet 3   mycophenolate  (CELLCEPT ) 500 MG tablet TAKE 3 TABLETS (1500 MG) BY MOUTH TWICE A DAY 540 tablet 1   nitroGLYCERIN  (NITROSTAT ) 0.4 MG SL tablet Place 1 tablet (0.4 mg total) under the tongue every 5 (five) minutes as needed for chest pain. max of 3 if no relief call 911 25 tablet PRN   pantoprazole  (PROTONIX ) 40 MG tablet  TAKE 1 TABLET BY MOUTH EVERY DAY 90 tablet 1   predniSONE  (DELTASONE ) 20 MG tablet TAKE 1 TABLET BY MOUTH DAILY WITH BREAKFAST 30 tablet 3   rosuvastatin  (CRESTOR ) 20 MG tablet Take 1 tablet by mouth 3 times per week 40 tablet 5   sacubitril -valsartan  (ENTRESTO ) 97-103 MG Take 1 tablet by mouth 2 (two) times daily. 180 tablet 3   spironolactone  (ALDACTONE ) 25 MG tablet Take 1 tablet (25 mg total) by mouth daily. 30 tablet 11   sulfamethoxazole -trimethoprim  (BACTRIM  DS) 800-160 MG tablet TAKE 1 TABLET BY MOUTH 3 (THREE) TIMES A WEEK. TAKE ON MONDAYS, WEDNESDAYS, AND FRIDAYS. 12 tablet 3   triamcinolone  cream (KENALOG ) 0.1 % Apply 1 Application topically daily.     No current facility-administered medications for this encounter.   PHYSICAL EXAM: Vitals:   07/26/24 1506  BP: 110/64  Pulse: 82  SpO2: 98%   GENERAL: NAD Lungs- CTA CARDIAC:  JVP: 7 cm          Normal rate with regular rhythm. no murmur.  Pulses 2+. No edema.  ABDOMEN: Soft, non-tender, non-distended.  EXTREMITIES: Warm and well perfused.  NEUROLOGIC: No obvious FND    DATA REVIEW  ECG: 03/26/23: NSR  As per my personal interpretation  ECHO: 05/07/22: LVEF 35%-40%, eccentric moderate - severe MR, normal RV function As per my personal interpretation 01/19/24: LVEF 30-35%; moderate mR.   CATH: 04/21/21:   Nonobstructive coronary artery disease with up to 40% stenosis involving the mid LAD and mid LCx.  Overall appearance is similar to prior catheterization in 2019. Patent LMCA/LAD stent. Normal left and right heart filling pressures. Mild pulmonary hypertension. Normal to supranormal Fick cardiac output/index. Normal transmitral gradient.  CMR:  11/03/22:  1. There is subendocardial LGE in apical anterior and mid to apical inferior walls, which could be consistent with LAD territory infarct as patient has known large wrap-around LAD, but the apex is spared, which argues against this. In addition, there is focal  area of subendocardial LGE suggesting infarct in the basal anteroseptum, which is not in a coronary distribution. There is also mid inferoseptal midwall LGE, which is a nonischemic scar pattern. 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 Cardiac sarcoid is on the differential, as can cause both subendocardial and midwall LGE. Recommend cardiac PET to evaluate for sarcoid.  2. Mild LV hypertrophy, normal size, and moderate systolic dysfunction (EF 35%)  3.  Normal RV size and systolic function (EF 64%)  4.  Mild mitral regurgitation (regurgitant fraction 16%)  PET (02/09/23)  FDG uptake was observed. FDG uptake was diffuse. FDG uptake was present in the apical to mid anterior, septal and apex segment(s). LV perfusion is abnormal. Defect 1: There is a large defect with severe reduction in uptake present in the apical anterior, inferior and apex location(s). There is abnormal wall motion  in the defect area. Consistent with infarction. Defect 2: There is a small defect with mild reduction in uptake present in the mid septal location(s). There is abnormal wall motion in the defect area. Consistent with infarction.   Left ventricular function is abnormal. There were multiple regional abnormalities. EF: 37 %. Akinesis of the apical septum, apical inferior segment, and apex. Akinesis of the basal to mid septum. End diastolic cavity size is mildly enlarged. End systolic cavity size is mildly enlarged.   Coronary calcium  assessment not performed due to prior revascularization.   FDG uptake findings are consistent with active inflammation/sarcoidosis.  Reviewed labs: sCr 0.8, Hgb 11.9 on 07/11/24  ASSESSMENT & PLAN:  Heart failure with reduced EF Etiology of YQ:pdryzfpr cardiomyopathy; hx of STEMI with PCI to the LAD, Lcx and LM; CMR with noncoronary distribution of LGE with active uptake via Cardiac PET.  NYHA class / AHA Stage:NYHAIIB Volume status  & Diuretics: Euvolemic, lasix  40mg  PRN only.  Vasodilators:Entresto  97/103mg  BID Beta-Blocker: continue metoprolol  to 25mg  daily.  MRA:spironolactone  50mg ;  Cardiometabolic:SGLT2i led to yeast infection. Will continue to hold as she is now on immunosuppressives. Devices therapies & Valvulopathies: LVEF 30-35% on TTE rom 05/25/24 now s/p ICD implant.  Advanced therapies:not indicated  2. Cardiac sarcoid - Vaccinations confirmed.  - PET from 02/09/23 w/ LVEF 37% - Currently on cellcept  1500mg  BID.  - Restarted on prednisone  20mg  daily on 02/15/24 by Marie Tanner (reviewed notes).  - Continue bactrim  while on prednisone  (3x weekly) - Continue PPI - Repeat PET scan in 04/13/24 with improvement in FDG uptake; however, FDG still present in the apical anterior and apex.  - Repeat PET today with persistent FDG uptake that is unchanged to prior despite continued prednisone  dosing & cellcept . There was initially improvement in uptake from 02/09/24 to 04/13/24 on PET scans. She now only as uptake in the anteroapex/apical cap. At our next visit we will decrease prednisone  to 10mg  and continue to wean. I have low suspicion for this area to change in intensity. She has struggled with weight gain and elevated glucose levels with the addition of prednisone .   3. Obesity - Body mass index is 35.48 kg/m. - Discussed side effects of prednisone  on weight and glucose.  - Stable; discussed importance of continued activity.   4. OSA - sleep study pending; needs split night sleep study.  - will schedule at follow up.   5. Moderate to severe MR - Moderate by TTE in 01/19/24.   I spent 40 minutes caring for this patient today including face to face time, ordering and reviewing labs, reviewing PET scan, discussing findings with patients and need for immunosupression, seeing the patient, documenting in the record, and arranging follow ups.    Marie Tanner Advanced Heart Failure Mechanical Circulatory Support

## 2024-07-26 NOTE — Patient Instructions (Signed)
 There has been no changes to your medications.  Please follow up with our heart failure pharmacist in 6 weeks.  Your physician recommends that you schedule a follow-up appointment in: 3 months ( November) ** PLEASE CALL THE OFFICE IN OCTOBER TO ARRANGE YOUR FOLLOW UP APPOINTMENT.**  If you have any questions or concerns before your next appointment please send us  a message through [place] or call our office at [phone].    TO LEAVE A MESSAGE FOR THE NURSE SELECT OPTION 2, PLEASE LEAVE A MESSAGE INCLUDING: YOUR NAME DATE OF BIRTH CALL BACK NUMBER REASON FOR CALL**this is important as we prioritize the call backs  YOU WILL RECEIVE A CALL BACK THE SAME DAY AS LONG AS YOU CALL BEFORE 4:00 PM  At the [facility], you and your health needs are our priority. As part of our continuing mission to provide you with exceptional heart care, we have created designated Provider Care Teams. These Care Teams include your primary Cardiologist (physician) and Advanced Practice Providers (APPs- Physician Assistants and Nurse Practitioners) who all work together to provide you with the care you need, when you need it.   You may see any of the following providers on your designated Care Team at your next follow up: Dr Toribio Fuel Dr Ezra Shuck Dr. Ria Commander Dr. [name] Amy Lenetta, NP Caffie Shed, [place] [facility] [place], [place] Beckey Coe, NP [place] Lee, NP Ellouise Class, NP Tinnie Redman, PharmD Jaun Bash, PharmD   Please be sure to bring in all your medications bottles to every appointment.    Thank you for choosing [facility]

## 2024-07-27 ENCOUNTER — Ambulatory Visit (HOSPITAL_COMMUNITY)

## 2024-07-27 ENCOUNTER — Ambulatory Visit (HOSPITAL_COMMUNITY)
Admission: RE | Admit: 2024-07-27 | Discharge: 2024-07-27 | Disposition: A | Discharge status: Home / Self Care | Attending: Internal Medicine | Admitting: Internal Medicine

## 2024-07-27 ENCOUNTER — Other Ambulatory Visit: Payer: Self-pay

## 2024-07-27 ENCOUNTER — Encounter (HOSPITAL_COMMUNITY)
Admission: RE | Disposition: A | Discharge status: Home / Self Care | Payer: Self-pay | Source: Home / Self Care | Attending: Internal Medicine

## 2024-07-27 DIAGNOSIS — I110 Hypertensive heart disease with heart failure: Secondary | ICD-10-CM | POA: Insufficient documentation

## 2024-07-27 DIAGNOSIS — I255 Ischemic cardiomyopathy: Secondary | ICD-10-CM

## 2024-07-27 DIAGNOSIS — I5042 Chronic combined systolic (congestive) and diastolic (congestive) heart failure: Secondary | ICD-10-CM

## 2024-07-27 DIAGNOSIS — I252 Old myocardial infarction: Secondary | ICD-10-CM | POA: Diagnosis not present

## 2024-07-27 DIAGNOSIS — Z8674 Personal history of sudden cardiac arrest: Secondary | ICD-10-CM | POA: Insufficient documentation

## 2024-07-27 HISTORY — PX: ICD IMPLANT: EP1208

## 2024-07-27 LAB — GLUCOSE, CAPILLARY
Glucose-Capillary: 70 mg/dL (ref 70–99)
Glucose-Capillary: 95 mg/dL (ref 70–99)

## 2024-07-27 SURGERY — ICD IMPLANT

## 2024-07-27 MED ORDER — LIDOCAINE HCL (PF) 1 % IJ SOLN
INTRAMUSCULAR | Status: DC | PRN
  Administered 2024-07-27: 45 mL

## 2024-07-27 MED ORDER — MIDAZOLAM HCL 2 MG/2ML IJ SOLN
INTRAMUSCULAR | Status: DC | PRN
  Administered 2024-07-27 (×2): 1 mg via INTRAVENOUS

## 2024-07-27 MED ORDER — SODIUM CHLORIDE 0.9 % IV SOLN: INTRAVENOUS | Status: DC

## 2024-07-27 MED ORDER — CEFAZOLIN SODIUM-DEXTROSE 2-4 GM/100ML-% IV SOLN: 2.0000 g | INTRAVENOUS | Status: AC

## 2024-07-27 MED ORDER — CHLORHEXIDINE GLUCONATE 4 % EX SOLN
4.0000 | Freq: Once | CUTANEOUS | Status: DC
  Filled 2024-07-27: qty 60

## 2024-07-27 MED ORDER — ACETAMINOPHEN 325 MG PO TABS
325.0000 mg | ORAL_TABLET | ORAL | Status: DC | PRN
  Administered 2024-07-27: 650 mg via ORAL
  Filled 2024-07-27: qty 2

## 2024-07-27 MED ORDER — LIDOCAINE HCL (PF) 1 % IJ SOLN
INTRAMUSCULAR | Status: AC
Start: 2024-07-27 — End: 2024-07-27
  Filled 2024-07-27: qty 60

## 2024-07-27 MED ORDER — HEPARIN (PORCINE) IN NACL 1000-0.9 UT/500ML-% IV SOLN
INTRAVENOUS | Status: DC | PRN
  Administered 2024-07-27: 500 mL

## 2024-07-27 MED ORDER — POVIDONE-IODINE 10 % EX SWAB
2.0000 | Freq: Once | CUTANEOUS | Status: AC
  Administered 2024-07-27: 2 via TOPICAL

## 2024-07-27 MED ORDER — SODIUM CHLORIDE 0.9 % IV SOLN
INTRAVENOUS | Status: AC
  Administered 2024-07-27: 80 mg
  Filled 2024-07-27: qty 2

## 2024-07-27 MED ORDER — ONDANSETRON HCL 4 MG/2ML IJ SOLN: 4.0000 mg | Freq: Four times a day (QID) | INTRAMUSCULAR | Status: DC | PRN

## 2024-07-27 MED ORDER — MIDAZOLAM HCL 2 MG/2ML IJ SOLN
INTRAMUSCULAR | Status: AC
  Filled 2024-07-27: qty 2

## 2024-07-27 MED ORDER — CEFAZOLIN SODIUM-DEXTROSE 2-4 GM/100ML-% IV SOLN
INTRAVENOUS | Status: AC
  Administered 2024-07-27: 2 g via INTRAVENOUS
  Filled 2024-07-27: qty 100

## 2024-07-27 MED ORDER — CEFAZOLIN SODIUM-DEXTROSE 1-4 GM/50ML-% IV SOLN: 1.0000 g | Freq: Once | INTRAVENOUS | Status: DC

## 2024-07-27 MED ORDER — FENTANYL CITRATE (PF) 100 MCG/2ML IJ SOLN
INTRAMUSCULAR | Status: AC
  Filled 2024-07-27: qty 2

## 2024-07-27 MED ORDER — SODIUM CHLORIDE 0.9 % IV SOLN: 80.0000 mg | INTRAVENOUS | Status: AC

## 2024-07-27 MED ORDER — FENTANYL CITRATE (PF) 100 MCG/2ML IJ SOLN
INTRAMUSCULAR | Status: DC | PRN
  Administered 2024-07-27 (×2): 12.5 ug via INTRAVENOUS

## 2024-07-27 SURGICAL SUPPLY — 6 items
CABLE SURGICAL S-101-97-12 (CABLE) ×1 IMPLANT
ELECT DEFIB PAD ADLT CADENCE (PAD) IMPLANT
ICD MOMENTUM D120 (ICD Generator) IMPLANT
LEAD RELIANCE 0137-59 (Lead) IMPLANT
SHEATH 8FR PRELUDE SNAP 13 (SHEATH) IMPLANT
TRAY PACEMAKER INSERTION (PACKS) ×1 IMPLANT

## 2024-07-27 NOTE — H&P (Signed)
 HPI Marie Tanner is referred by Dr. Gardenia for consideration for ICD insertion. She is a pleasant 68 yo woman with a h/o anterior MI/shock s/p emergent PCI of the LM and LAD after exeriencing a VF arrest almost 10 years ago. 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 She alos has a h/o breast CA. She has done well otherwise. No syncope, chest pain or sob.  Allergies       Allergies  Allergen Reactions   Dilaudid [Hydromorphone Hcl] Anaphylaxis, Itching and Swelling   Epinephrine  Anxiety and Other (See Comments)      Severe anxiety   Lactose Intolerance (Gi) Diarrhea   Tilactase        Other Reaction(s): Diarrhea   Jardiance  [Empagliflozin ]        YEAST INFECTION    Shellfish Allergy                  Current Outpatient Medications  Medication Sig Dispense Refill   acetaminophen  (TYLENOL ) 500 MG tablet Take 1,000 mg by mouth every 6 (six) hours as needed for moderate pain.       aspirin  EC 81 MG tablet Take 1 tablet (81 mg total) by mouth daily.       blood glucose meter kit and supplies KIT by Does not apply route daily as needed. Dispense based on patient and insurance preference. Use up to four times daily as directed. (FOR ICD-9 250.00, 250.01).       calcium -vitamin D (OSCAL WITH D) 500-5 MG-MCG tablet Take 1 tablet by mouth 2 (two) times daily.       cetirizine (ZYRTEC) 10 MG tablet Take 10 mg by mouth as needed for allergies.       Dulaglutide  (TRULICITY ) 4.5 MG/0.5ML SOPN Inject 4.5 mg as directed once a week. (Patient taking differently: Inject 4.5 mg as directed once a week. Takes on Saturdays) 2 mL 11   Evolocumab  (REPATHA  SURECLICK) 140 MG/ML SOAJ INJECT 140 MG INTO THE SKIN EVERY 14 (FOURTEEN) DAYS. 6 mL 3   fluticasone  (FLONASE ) 50 MCG/ACT nasal spray Place 1 spray into both nostrils as needed for allergies.       furosemide  (LASIX ) 40 MG tablet TAKE 1 TABLET ONCE PER WEEK WITH ADDITIONAL TABLET AS NEEDED FOR SHORTNESS OF  BREATH OR WEIGHT GAIN OF 2 POUNDS IN 24 HOURS OR 5 POUNDS IN 1 WEEK 30 tablet 3   gabapentin (NEURONTIN) 300 MG capsule Take 300 mg by mouth at bedtime.       glucose blood test strip Use as instructed. E11.5. To check blood glucose daily. 100 each 12   ketoconazole  (NIZORAL ) 2 % cream Apply to both feet and between toes once daily. 60 g 1   magnesium  oxide (MAG-OX) 400 MG tablet TAKE 1 TABLET BY MOUTH EVERY DAY 90 tablet 0   metoprolol  succinate (TOPROL -XL) 25 MG 24 hr tablet Take 1 tablet (25 mg total) by mouth daily. Take with or immediately following a meal. 90 tablet 3   mycophenolate  (CELLCEPT ) 500 MG tablet TAKE 3 TABLETS (1500 MG) BY MOUTH TWICE A DAY 540 tablet 1   nitroGLYCERIN  (NITROSTAT ) 0.4 MG SL tablet Place 1 tablet (0.4 mg total) under the tongue every 5 (five) minutes as needed for chest pain. max of 3 if no relief call 911 25 tablet PRN   pantoprazole  (PROTONIX ) 40 MG tablet Take 1 tablet (40 mg total) by  mouth daily. 30 tablet 5   predniSONE  (DELTASONE ) 20 MG tablet Take 1 tablet (20 mg total) by mouth daily with breakfast. 30 tablet 3   rosuvastatin  (CRESTOR ) 20 MG tablet Take 1 tablet by mouth 3 times per week (Patient taking differently: Take 1 tablet by mouth 3 times per week on Mondays Wednesdays and Fridays) 40 tablet 5   sacubitril -valsartan  (ENTRESTO ) 97-103 MG Take 1 tablet by mouth 2 (two) times daily. 180 tablet 3   spironolactone  (ALDACTONE ) 25 MG tablet Take 1 tablet (25 mg total) by mouth daily. 30 tablet 11   sulfamethoxazole -trimethoprim  (BACTRIM  DS) 800-160 MG tablet Take 1 tablet by mouth 3 (three) times a week. Take on Mondays, Wednesdays, and Fridays. 12 tablet 3   ticagrelor  (BRILINTA ) 60 MG TABS tablet Take 1 tablet (60 mg total) by mouth 2 (two) times daily. 180 tablet 3   triamcinolone  cream (KENALOG ) 0.1 % Apply 1 Application topically daily.          No current facility-administered medications for this visit.              Past Medical History:   Diagnosis Date   Breast cancer (HCC)     Cancer (HCC)     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   Chronic combined systolic and diastolic heart failure (HCC) 01/30/2015   Depression     DVT (deep venous thrombosis) (HCC) 09/2014    left femerol artery injury during resusciation.   GERD (gastroesophageal reflux disease)     HCAP (healthcare-associated pneumonia) 11/22/2014   Heart murmur     High cholesterol     History of blood transfusion 09/2014    related to MI   History of gout     Hypertension     Liver laceration 10/19/2014   Palpitations 12/12/2020   Pulmonary edema 10/19/2014   Renal disorder     Renal disorder     Right hip pain 04/03/2021   Sleep apnea      suppose to wear mask but I don't (11/22/2014)   STEMI (ST elevation myocardial infarction) (HCC) 10/19/2014   Type II diabetes mellitus (HCC)     Urticaria            ROS:    All systems reviewed and negative except as noted in the HPI.          Past Surgical History:  Procedure Laterality Date   ABDOMINAL HYSTERECTOMY   ~ 2000   APPLICATION OF WOUND VAC   10/2014    over my naval (11/22/2014)   BREAST LUMPECTOMY Right 2017   BREAST LUMPECTOMY WITH AXILLARY LYMPH NODE BIOPSY       BREAST SURGERY       CESAREAN SECTION   1980; 1982   CORONARY ANGIOPLASTY WITH STENT PLACEMENT   09/2014    multiple/notes 11/22/2014   EXTRACORPOREAL CIRCULATION       Femerol artery repair Right 09/2014    pt insists it was a right femoral artery repair on 11/22/2014   LAPAROSCOPIC ABDOMINAL EXPLORATION       LEFT HEART CATH AND CORONARY ANGIOGRAPHY N/A 12/07/2017    Procedure: LEFT HEART CATH AND CORONARY ANGIOGRAPHY;  Surgeon: Anner Alm ORN, MD;  Location: [facility] INVASIVE CV LAB;  Service: Cardiovascular;  Laterality: N/A;   LEFT VENTRICULAR ASSIST DEVICE        pt is not aware of this hx on 11/22/2014   Liver laceration repair   09/2014    related to CPR/notes 11/22/2014  RIGHT/LEFT HEART CATH AND CORONARY  ANGIOGRAPHY N/A 04/21/2021    Procedure: RIGHT/LEFT HEART CATH AND CORONARY ANGIOGRAPHY;  Surgeon: Mady Bruckner, MD;  Location: [facility] INVASIVE CV LAB;  Service: Cardiovascular;  Laterality: N/A;   TEE WITHOUT CARDIOVERSION N/A 03/13/2021    Procedure: TRANSESOPHAGEAL ECHOCARDIOGRAM (TEE);  Surgeon: Loni Soyla LABOR, MD;  Location: [facility] ENDOSCOPY;  Service: Cardiology;  Laterality: N/A;   TRACHEOSTOMY   09/2014    closed on it's own when they took it out   TUBAL LIGATION   1982                 Family History  Problem Relation Age of Onset   Diabetes Mother     Hypertension Mother     COPD Mother     Heart disease Father     Heart attack Father     Alzheimer's disease Father     Diabetes Brother     Asthma Daughter     Stroke Neg Hx     Colon cancer Neg Hx     Stomach cancer Neg Hx     Pancreatic cancer Neg Hx     AAA (abdominal aortic aneurysm) Neg Hx              Social History         Socioeconomic History   Marital status: Divorced      Spouse name: Not on file   Number of children: 2   Years of education: Not on file   Highest education level: Not on file  Occupational History   Not on file  Tobacco Use   Smoking status: Never   Smokeless tobacco: Never  Vaping Use   Vaping status: Never Used  Substance and Sexual Activity   Alcohol use: Never   Drug use: No   Sexual activity: Not Currently  Other Topics Concern   Not on file  Social History Narrative   Not on file    Social Drivers of Health        Financial Resource Strain: Not on File (03/19/2022)    Received from [employer]     Financial Resource Strain: 0  Food Insecurity: Declined (04/06/2023)    Received from [employer] Insecurity     Food: 99  Transportation Needs: Not at Risk (04/06/2023)    Received from [employer] Needs     Transportation: 1  Physical Activity: Not on File (03/19/2022)    Received from [facility]    Physical Activity     Physical  Activity: 0  Stress: Not on File (03/19/2022)    Received from [facility]    Stress     Stress: 0  Social Connections: Not on File (08/04/2023)    Received from [employer]    Social Connections     Connectedness: 0  Intimate Partner Violence: Not on file        BP 118/60   Pulse 94   Ht 5' 2 (1.575 m)   Wt 194 lb (88 kg)   SpO2 97%   BMI 35.48 kg/m    Physical Exam:   Well appearing NAD HEENT: Unremarkable Neck:  No JVD, no thyromegally Lymphatics:  No adenopathy Back:  No CVA tenderness Lungs:  Clear with no wheezes HEART:  Regular rate rhythm, no murmurs, no rubs, no clicks Abd:  soft, positive bowel sounds, no organomegally, no rebound, no guarding Ext:  2 plus pulses, no  edema, no cyanosis, no clubbing Skin:  No rashes no nodules Neuro:  CN II through XII intact, motor grossly intact   EKG - NSR with inferior and anterior MI.     Assess/Plan: ICM - she is s/p MI and her EF is 30%. She is on maximal GDMT under Dr. Sharol direction. I have offered her ICD Insertion and the indications/risk/benefits/goals/expectations were reviewed and she will call us  if she wishes to proceed.   Danelle Nikie Cid,MD

## 2024-07-27 NOTE — Progress Notes (Signed)
 Discharge instructions reviewed with patient and patient sister at bedside. Denies questions or concerns. PT ambulated in the hall to the bathroom was able to void without difficulty. Pt escorted from the unit via wheel chair to personal vehicle/

## 2024-07-27 NOTE — Discharge Instructions (Addendum)
 Subcutaneous Cardioverter Defibrillator Implantation, Care After  After Your ICD (Implantable Cardiac Defibrillator)   You have a Environmental manager Subcutaneous ICD  ACTIVITY  Do not lift, push, pull, or carry anything over 10 pounds with the until you are instructed it is safe to do so by your provider.   You may drive AFTER your wound check, unless you have been told otherwise by your provider.   Ask your healthcare provider when you can go back to work and resume heavy lifting or exertion.    INCISION/Dressing If you are on a blood thinner such as Coumadin , Xarelto, Eliquis, Plavix, or Pradaxa please confirm with your provider when this should be resumed.   If large square, outer bandage is left in place, this can be removed after 24 hours from your procedure. Do not remove steri-strips or glue as below.   Monitor your defibrillator sites for redness, swelling, and drainage. Call the device clinic at [phone] if you experience these symptoms or fever/chills.  If your incision is sealed with Steri-strips or staples, you may shower 7 days after your procedure or when told by your provider. Do not remove the steri-strips or let the shower hit directly on your site. You may wash around your site with soap and water.    If you were discharged in a sling, please do not wear this during the day more than 48 hours after your surgery unless otherwise instructed. This may increase the risk of stiffness and soreness in your shoulder.   Avoid lotions, ointments, or perfumes over your incision until it is well-healed.  You may use a hot tub or a pool AFTER your wound check appointment if the incision is completely closed.  Your ICD is designed to protect you from life threatening heart rhythms. Because of this, you may receive a shock.   1 shock with no symptoms:  Call the office during business hours. 1 shock with symptoms (chest pain, chest pressure, dizziness, lightheadedness,  shortness of breath, overall feeling unwell):  Call 911. If you experience 2 or more shocks in 24 hours:  Call 911. If you receive a shock, you should not drive for 6 months per the [place] DMV IF you receive appropriate therapy from your ICD.   ICD Alerts:  Some alerts are vibratory and others beep. These are NOT emergencies. Please call our office to let us  know. If this occurs at night or on weekends, it can wait until the next business day. Send a remote transmission.  DEVICE MANAGEMENT Remote monitoring is used to monitor your ICD from home. This monitoring is scheduled every 91 days by our office. It allows us  to keep an eye on the functioning of your device to ensure it is working properly. You will routinely see your Electrophysiologist annually (more often if necessary).   You should receive your ID card for your new device in 4-8 weeks. Keep this card with you at all times once received. Consider wearing a medical alert bracelet or necklace.  Your ICD may be MRI compatible. This will be discussed at your next office visit/wound check.  You should avoid contact with strong electric or magnetic fields.   Do not use amateur (ham) radio equipment or electric (arc) welding torches. MP3 player headphones with magnets should not be used. Some devices are safe to use if held at least 12 inches (30 cm) from your defibrillator. These include power tools, lawn mowers, and speakers. If you are unsure if something is safe to  use, ask your health care provider.  When using your cell phone, hold it to the ear that is on the opposite side from the defibrillator. Do not leave your cell phone in a pocket over the defibrillator.  You may safely use electric blankets, heating pads, computers, and microwave ovens.  Call the office right away if: You have chest pain. You feel more than one shock. You feel more short of breath than you have felt before. You feel more light-headed than you have felt  before. Your incision starts to open up.  This information is not intended to replace advice given to you by your health care provider. Make sure you discuss any questions you have with your health care provider.  After Your ICD (Implantable Cardiac Defibrillator)    ACTIVITY Do not lift your arm above shoulder height for 1 week after your procedure. After 7 days, you may progress as below.  You should remove your sling 24 hours after your procedure, unless otherwise instructed by your provider.     Thursday August 03, 2024  Friday August 04, 2024 Saturday August 05, 2024 Sunday August 06, 2024   Do not lift, push, pull, or carry anything over 10 pounds with the affected arm until 6 weeks (Thursday September 07, 2024 ) after your procedure.   You may drive AFTER your wound check, unless you have been told otherwise by your provider.   Ask your healthcare provider when you can go back to work

## 2024-07-28 ENCOUNTER — Encounter (HOSPITAL_COMMUNITY): Payer: Self-pay | Admitting: Internal Medicine

## 2024-08-08 ENCOUNTER — Ambulatory Visit: Attending: Cardiology | Admitting: *Deleted

## 2024-08-08 DIAGNOSIS — I5042 Chronic combined systolic (congestive) and diastolic (congestive) heart failure: Secondary | ICD-10-CM | POA: Diagnosis not present

## 2024-08-08 NOTE — Patient Instructions (Signed)

## 2024-08-10 ENCOUNTER — Inpatient Hospital Stay: Attending: Hematology and Oncology | Admitting: Hematology and Oncology

## 2024-08-10 VITALS — BP 125/60 | HR 84 | Temp 97.7°F | Resp 18 | Ht 62.0 in | Wt 197.3 lb

## 2024-08-10 DIAGNOSIS — Z86718 Personal history of other venous thrombosis and embolism: Secondary | ICD-10-CM | POA: Diagnosis not present

## 2024-08-10 DIAGNOSIS — E119 Type 2 diabetes mellitus without complications: Secondary | ICD-10-CM | POA: Insufficient documentation

## 2024-08-10 DIAGNOSIS — G4730 Sleep apnea, unspecified: Secondary | ICD-10-CM | POA: Insufficient documentation

## 2024-08-10 DIAGNOSIS — Z79899 Other long term (current) drug therapy: Secondary | ICD-10-CM | POA: Diagnosis not present

## 2024-08-10 DIAGNOSIS — Z7902 Long term (current) use of antithrombotics/antiplatelets: Secondary | ICD-10-CM | POA: Diagnosis not present

## 2024-08-10 DIAGNOSIS — I5042 Chronic combined systolic (congestive) and diastolic (congestive) heart failure: Secondary | ICD-10-CM | POA: Diagnosis not present

## 2024-08-10 DIAGNOSIS — Z825 Family history of asthma and other chronic lower respiratory diseases: Secondary | ICD-10-CM | POA: Insufficient documentation

## 2024-08-10 DIAGNOSIS — Z7952 Long term (current) use of systemic steroids: Secondary | ICD-10-CM | POA: Diagnosis not present

## 2024-08-10 DIAGNOSIS — Z90710 Acquired absence of both cervix and uterus: Secondary | ICD-10-CM | POA: Insufficient documentation

## 2024-08-10 DIAGNOSIS — Z7982 Long term (current) use of aspirin: Secondary | ICD-10-CM | POA: Diagnosis not present

## 2024-08-10 DIAGNOSIS — Z9221 Personal history of antineoplastic chemotherapy: Secondary | ICD-10-CM | POA: Diagnosis not present

## 2024-08-10 DIAGNOSIS — Z833 Family history of diabetes mellitus: Secondary | ICD-10-CM | POA: Diagnosis not present

## 2024-08-10 DIAGNOSIS — Z1722 Progesterone receptor negative status: Secondary | ICD-10-CM | POA: Insufficient documentation

## 2024-08-10 DIAGNOSIS — Z8249 Family history of ischemic heart disease and other diseases of the circulatory system: Secondary | ICD-10-CM | POA: Insufficient documentation

## 2024-08-10 DIAGNOSIS — I110 Hypertensive heart disease with heart failure: Secondary | ICD-10-CM | POA: Diagnosis not present

## 2024-08-10 DIAGNOSIS — Z923 Personal history of irradiation: Secondary | ICD-10-CM | POA: Insufficient documentation

## 2024-08-10 DIAGNOSIS — C50911 Malignant neoplasm of unspecified site of right female breast: Secondary | ICD-10-CM

## 2024-08-10 DIAGNOSIS — Z1732 Human epidermal growth factor receptor 2 negative status: Secondary | ICD-10-CM | POA: Insufficient documentation

## 2024-08-10 DIAGNOSIS — Z170 Estrogen receptor positive status [ER+]: Secondary | ICD-10-CM | POA: Diagnosis not present

## 2024-08-10 DIAGNOSIS — Z8701 Personal history of pneumonia (recurrent): Secondary | ICD-10-CM | POA: Insufficient documentation

## 2024-08-10 DIAGNOSIS — I252 Old myocardial infarction: Secondary | ICD-10-CM | POA: Insufficient documentation

## 2024-08-10 DIAGNOSIS — Z885 Allergy status to narcotic agent status: Secondary | ICD-10-CM | POA: Diagnosis not present

## 2024-08-10 DIAGNOSIS — I890 Lymphedema, not elsewhere classified: Secondary | ICD-10-CM | POA: Diagnosis not present

## 2024-08-10 DIAGNOSIS — Z820 Family history of epilepsy and other diseases of the nervous system: Secondary | ICD-10-CM | POA: Diagnosis not present

## 2024-08-10 DIAGNOSIS — C50411 Malignant neoplasm of upper-outer quadrant of right female breast: Secondary | ICD-10-CM | POA: Insufficient documentation

## 2024-08-10 LAB — CUP PACEART INCLINIC DEVICE CHECK
Date Time Interrogation Session: 20250909000000
HighPow Impedance: 78 Ohm
Implantable Lead Connection Status: 753985
Implantable Lead Implant Date: 20250828
Implantable Lead Location: 753860
Implantable Lead Model: 137
Implantable Lead Serial Number: 302079
Implantable Pulse Generator Implant Date: 20250828
Lead Channel Impedance Value: 707 Ohm
Lead Channel Pacing Threshold Amplitude: 0.6 V
Lead Channel Pacing Threshold Amplitude: 0.7 V
Lead Channel Pacing Threshold Pulse Width: 0.4 ms
Lead Channel Pacing Threshold Pulse Width: 0.4 ms
Lead Channel Sensing Intrinsic Amplitude: 25 mV
Lead Channel Setting Pacing Amplitude: 3.5 V
Lead Channel Setting Pacing Pulse Width: 0.4 ms
Lead Channel Setting Sensing Sensitivity: 0.6 mV
Pulse Gen Serial Number: 224974
Zone Setting Status: 755011

## 2024-08-10 NOTE — Assessment & Plan Note (Signed)
 2017: [facility]: Right lumpectomy: T1c N1a M0 stage IIa IDC ER 100% PR 0% HER2 negative Status post 4 cycles of Taxotere and Cytoxan Followed by radiation  Current treatment: Exemestane 25 mg daily started 2017

## 2024-08-10 NOTE — Progress Notes (Signed)
 Normal single chamber ICD wound check. Wound well healed. Presenting rhythm: VS. Routine testing performed. Thresholds, sensing, and impedance consistent with implant measurements with 3.5V safety margin/auto capture until 3 month visit. No treated arrhythmias. Reviewed arm restrictions to continue for 6 weeks total post op. Reviewed shock plan.  Pt enrolled in remote follow-up.

## 2024-08-10 NOTE — Progress Notes (Signed)
 [facility] CONSULT NOTE  Patient Care Team: Delores Rojelio Caldron, NP as PCP - General (Nurse Practitioner) Raford Riggs, MD as PCP - Cardiology (Cardiology) Jama, Amy (Inactive) (Cardiology)  CHIEF COMPLAINTS/PURPOSE OF CONSULTATION:  To establish oncology care with us   HISTORY OF PRESENTING ILLNESS:  History of Present Illness Marie Tanner is a 68 year old female with breast cancer and cardiomyopathy who presents for oncology follow-up.  She was diagnosed with stage 2A estrogen receptor-positive breast cancer in 2017, involving a lymph node. She completed chemotherapy, radiation therapy, and a five-year course of Exemestane two years ago. She experiences lymphedema in her arm and breast due to surgery and radiation.  In 2015, she had a myocardial infarction and was diagnosed with cardiomyopathy, leading to stent placement and recent defibrillator implantation. She experiences soreness from the procedure. She also has diabetes, which increases her risk for cardiac events. She completed antiestrogen therapy in 2022 and has been going to [facility] for annual follow-ups and decided to come see as locally. I reviewed her records extensively and collaborated the history with the patient.  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   GERD (gastroesophageal reflux disease)    HCAP (healthcare-associated pneumonia) 11/22/2014   Heart murmur    High cholesterol    History of blood transfusion 09/2014   related to MI   History of gout    Hypertension    Liver laceration 10/19/2014   Palpitations 12/12/2020   Pulmonary edema 10/19/2014   Renal disorder    Renal disorder    Right hip pain 04/03/2021   Sleep apnea    suppose to wear mask but I don't (11/22/2014)   STEMI (ST elevation myocardial infarction) (HCC) 10/19/2014   Type II diabetes mellitus (HCC)    Urticaria     SURGICAL HISTORY: Past Surgical History:  Procedure Laterality Date   ABDOMINAL HYSTERECTOMY  ~ 2000   APPLICATION OF WOUND VAC  10/2014   over my naval (11/22/2014)   BREAST LUMPECTOMY Right 2017   BREAST LUMPECTOMY WITH AXILLARY LYMPH NODE BIOPSY     BREAST SURGERY     CESAREAN SECTION  1980; 1982   CORONARY ANGIOPLASTY WITH STENT PLACEMENT  09/2014   multiple/notes 11/22/2014   EXTRACORPOREAL CIRCULATION     Femerol artery repair Right 09/2014   pt insists it was a right femoral artery repair on 11/22/2014   ICD IMPLANT N/A 07/27/2024   Procedure: ICD IMPLANT;  Surgeon: Waddell Danelle ORN, MD;  Location: [facility] INVASIVE CV LAB;  Service: Cardiovascular;  Laterality: N/A;   LAPAROSCOPIC ABDOMINAL EXPLORATION     LEFT HEART CATH AND CORONARY ANGIOGRAPHY N/A 12/07/2017   Procedure: LEFT  HEART CATH AND CORONARY ANGIOGRAPHY;  Surgeon: Anner Alm ORN, MD;  Location: [facility] INVASIVE CV LAB;  Service: Cardiovascular;  Laterality: N/A;   LEFT VENTRICULAR ASSIST DEVICE     pt is not aware of this hx on 11/22/2014   Liver laceration repair  09/2014   related to CPR/notes 11/22/2014   RIGHT/LEFT HEART CATH AND CORONARY ANGIOGRAPHY N/A 04/21/2021   Procedure: RIGHT/LEFT HEART CATH AND CORONARY ANGIOGRAPHY;  Surgeon: Mady Bruckner, MD;   Location: [facility] INVASIVE CV LAB;  Service: Cardiovascular;  Laterality: N/A;   TEE WITHOUT CARDIOVERSION N/A 03/13/2021   Procedure: TRANSESOPHAGEAL ECHOCARDIOGRAM (TEE);  Surgeon: Loni Soyla LABOR, MD;  Location[facility] ENDOSCOPY;  Service: Cardiology;  Laterality: N/A;   TRACHEOSTOMY  09/2014   closed on it's own when they took it out   TUBAL LIGATION  1982    SOCIAL HISTORY: Social History   Socioeconomic History   Marital status: Divorced    Spouse name: Not on file   Number of children: 2   Years of education: Not on file   Highest education level: Not on file  Occupational History   Not on file  Tobacco Use   Smoking status: Never   Smokeless tobacco: Never  Vaping Use   Vaping status: Never Used  Substance and Sexual Activity   Alcohol use: Never   Drug use: No   Sexual activity: Not Currently  Other Topics Concern   Not on file  Social History Narrative   Not on file   Social Drivers of Health   Financial Resource Strain: Not on File (03/19/2022)   Received from [employer]    Financial Resource Strain: 0  Food Insecurity: No Food Insecurity (08/10/2024)   Hunger Vital Sign    Worried About Running Out of Food in the Last Year: Never true    Ran Out of Food in the Last Year: Never true  Transportation Needs: No Transportation Needs (08/10/2024)   PRAPARE - Administrator, Civil Service (Medical): No    Lack of Transportation (Non-Medical): No  Physical Activity: Not on File (03/19/2022)   Received from [facility]   Physical Activity    Physical Activity: 0  Stress: Not on File (03/19/2022)   Received from [facility]   Stress    Stress: 0  Social Connections: Not on File (08/04/2023)   Received from [facility]   Social Connections    Connectedness: 0  Intimate Partner Violence: Not At Risk (08/10/2024)   Humiliation, Afraid, Rape, and Kick questionnaire    Fear of Current or Ex-Partner: No    Emotionally Abused: No    Physically Abused: No     Sexually Abused: No    FAMILY HISTORY: Family History  Problem Relation Age of Onset   Diabetes Mother    Hypertension Mother    COPD Mother    Heart disease Father    Heart attack Father    Alzheimer's disease Father    Diabetes Brother    Asthma Daughter    Stroke Neg Hx    Colon cancer Neg Hx    Stomach cancer Neg Hx    Pancreatic cancer Neg Hx    AAA (abdominal aortic aneurysm) Neg Hx     ALLERGIES:  is allergic to dilaudid [hydromorphone hcl], epinephrine , lactose intolerance (gi), tilactase, jardiance  [empagliflozin ], and shellfish allergy.  MEDICATIONS:  Current Outpatient Medications  Medication Sig Dispense Refill   acetaminophen  (TYLENOL ) 500 MG tablet Take 1,000 mg by mouth  every 6 (six) hours as needed for moderate pain.     aspirin  EC 81 MG tablet Take 1 tablet (81 mg total) by mouth daily.     blood glucose meter kit and supplies KIT by Does not apply route daily as needed. Dispense based on patient and insurance preference. Use up to four times daily as directed. (FOR ICD-9 250.00, 250.01).     calcium -vitamin D (OSCAL WITH D) 500-5 MG-MCG tablet Take 1 tablet by mouth 2 (two) times daily.     cephALEXin (KEFLEX) 500 MG capsule Take 500 mg by mouth.     Dulaglutide  (TRULICITY ) 4.5 MG/0.5ML SOPN Inject 4.5 mg as directed once a week. 2 mL 11   Evolocumab  (REPATHA  SURECLICK) 140 MG/ML SOAJ INJECT 140 MG INTO THE SKIN EVERY 14 (FOURTEEN) DAYS. 6 mL 3   fluticasone  (FLONASE ) 50 MCG/ACT nasal spray Place 1 spray into both nostrils as needed for allergies.     furosemide  (LASIX ) 40 MG tablet TAKE 1 TABLET ONCE PER WEEK WITH ADDITIONAL TABLET AS NEEDED FOR SHORTNESS OF BREATH OR WEIGHT GAIN OF 2 POUNDS IN 24 HOURS OR 5 POUNDS IN 1 WEEK 30 tablet 3   gabapentin (NEURONTIN) 300 MG capsule Take 300 mg by mouth at bedtime.     glucose blood test strip Use as instructed. E11.5. To check blood glucose daily. 100 each 12   ketoconazole  (NIZORAL ) 2 % cream Apply to both feet  and between toes once daily. 60 g 1   magnesium  oxide (MAG-OX) 400 MG tablet TAKE 1 TABLET BY MOUTH EVERY DAY 90 tablet 0   metoprolol  succinate (TOPROL -XL) 25 MG 24 hr tablet Take 1 tablet (25 mg total) by mouth daily. Take with or immediately following a meal. 90 tablet 3   mycophenolate  (CELLCEPT ) 500 MG tablet TAKE 3 TABLETS (1500 MG) BY MOUTH TWICE A DAY 540 tablet 1   nitroGLYCERIN  (NITROSTAT ) 0.4 MG SL tablet Place 1 tablet (0.4 mg total) under the tongue every 5 (five) minutes as needed for chest pain. max of 3 if no relief call 911 25 tablet PRN   pantoprazole  (PROTONIX ) 40 MG tablet TAKE 1 TABLET BY MOUTH EVERY DAY 90 tablet 1   predniSONE  (DELTASONE ) 20 MG tablet TAKE 1 TABLET BY MOUTH DAILY WITH BREAKFAST 30 tablet 3   rosuvastatin  (CRESTOR ) 20 MG tablet Take 1 tablet by mouth 3 times per week 40 tablet 5   sacubitril -valsartan  (ENTRESTO ) 97-103 MG Take 1 tablet by mouth 2 (two) times daily. 180 tablet 3   spironolactone  (ALDACTONE ) 25 MG tablet Take 1 tablet (25 mg total) by mouth daily. 30 tablet 11   sulfamethoxazole -trimethoprim  (BACTRIM  DS) 800-160 MG tablet TAKE 1 TABLET BY MOUTH 3 (THREE) TIMES A WEEK. TAKE ON MONDAYS, WEDNESDAYS, AND FRIDAYS. 12 tablet 3   triamcinolone  cream (KENALOG ) 0.1 % Apply 1 Application topically daily.     BRILINTA  60 MG TABS tablet TAKE 1 TABLET BY MOUTH 2 TIMES DAILY. 180 tablet 3   No current facility-administered medications for this visit.    REVIEW OF SYSTEMS:   Constitutional: Denies fevers, chills or abnormal night sweats Breast:  Denies any palpable lumps or discharge All other systems were reviewed with the patient and are negative.  PHYSICAL EXAMINATION: ECOG PERFORMANCE STATUS: 1 - Symptomatic but completely ambulatory  Vitals:   08/10/24 1549  BP: 125/60  Pulse: 84  Resp: 18  Temp: 97.7 F (36.5 C)  SpO2: 100%   Filed Weights   08/10/24 1549  Weight: 197  lb 4.8 oz (89.5 kg)    GENERAL:alert, no distress and  comfortable BREAST: No palpable nodules in breast. No palpable axillary or supraclavicular lymphadenopathy (exam performed in the presence of a chaperone).  There is significant lymphedema in the lower outer aspect of the right breast on the right arm  LABORATORY DATA:  I have reviewed the data as listed Lab Results  Component Value Date   WBC 7.8 07/11/2024   HGB 11.9 (L) 07/11/2024   HCT 37.9 07/11/2024   MCV 86.9 07/11/2024   PLT 322 07/11/2024   Lab Results  Component Value Date   NA 139 07/11/2024   K 4.4 07/11/2024   CL 104 07/11/2024   CO2 26 07/11/2024    RADIOGRAPHIC STUDIES: I have personally reviewed the radiological reports and agreed with the findings in the report.  ASSESSMENT AND PLAN:  Malignant neoplasm of upper-outer quadrant of right breast in female, estrogen receptor positive (HCC) 2017: [facility]: Right lumpectomy: T1c N1a M0 stage IIa IDC ER 100% PR 0% HER2 negative Status post 4 cycles of Taxotere and Cytoxan Followed by radiation Antiestrogen therapy with exemestane started 2017 completed 2022  Current treatment:  Mammogram January 2025: Benign breast density category B Breast exam 08/10/2024: Benign Recommended guardant reveal for MRD testing Return to clinic in 1 year for follow-up  Assessment & Plan Stage 2A right breast cancer, estrogen receptor-positive, with lymph node involvement Completed chemotherapy, radiation, and five years of Exemestane. Recent mammogram showed no significant findings. Discussed Gardant blood test for recurrence detection. - Order Gardant blood test every six months. - Continue annual mammograms in January.  Lymphedema of right arm and breast Lymphedema post-surgery and radiation for breast cancer. -     All questions were answered. The patient knows to call the clinic with any problems, questions or concerns.    Viinay K Taniyah Ballow, MD 08/10/24

## 2024-08-11 ENCOUNTER — Telehealth: Payer: Self-pay

## 2024-08-11 NOTE — Telephone Encounter (Signed)
 Per md orders entered for Guardant Reveal and all supported documents faxed to [phone]. Faxed confirmation was received.

## 2024-08-14 ENCOUNTER — Encounter: Payer: Self-pay | Admitting: Rehabilitation

## 2024-08-14 ENCOUNTER — Other Ambulatory Visit: Payer: Self-pay

## 2024-08-14 ENCOUNTER — Ambulatory Visit: Attending: Hematology and Oncology | Admitting: Rehabilitation

## 2024-08-14 DIAGNOSIS — C50911 Malignant neoplasm of unspecified site of right female breast: Secondary | ICD-10-CM | POA: Diagnosis not present

## 2024-08-14 DIAGNOSIS — Z483 Aftercare following surgery for neoplasm: Secondary | ICD-10-CM | POA: Insufficient documentation

## 2024-08-14 DIAGNOSIS — I890 Lymphedema, not elsewhere classified: Secondary | ICD-10-CM | POA: Diagnosis present

## 2024-08-14 DIAGNOSIS — Z170 Estrogen receptor positive status [ER+]: Secondary | ICD-10-CM | POA: Diagnosis present

## 2024-08-14 DIAGNOSIS — L599 Disorder of the skin and subcutaneous tissue related to radiation, unspecified: Secondary | ICD-10-CM | POA: Diagnosis present

## 2024-08-14 DIAGNOSIS — C50411 Malignant neoplasm of upper-outer quadrant of right female breast: Secondary | ICD-10-CM | POA: Insufficient documentation

## 2024-08-14 DIAGNOSIS — M25611 Stiffness of right shoulder, not elsewhere classified: Secondary | ICD-10-CM | POA: Insufficient documentation

## 2024-08-14 NOTE — Therapy (Signed)
 OUTPATIENT PHYSICAL THERAPY  UPPER EXTREMITY ONCOLOGY EVALUATION  Patient Name: Marie Tanner MRN: [id number] DOB:June 19, 1956, 68 y.o., female Today's Date: 08/14/2024  END OF SESSION:  PT End of Session - 08/14/24 1155     Visit Number 1    Number of Visits 9    Date for PT Re-Evaluation 09/11/24    Authorization Type none needed    PT Start Time 1110    PT Stop Time 1154    PT Time Calculation (min) 44 min    Activity Tolerance Patient tolerated treatment well    Behavior During Therapy [facility] for tasks assessed/performed          Past Medical History:  Diagnosis Date   Breast cancer (HCC)    Cancer (HCC)    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   GERD (gastroesophageal reflux disease)    HCAP (healthcare-associated pneumonia) 11/22/2014   Heart murmur    High cholesterol    History of blood transfusion 09/2014   related to MI   History of gout    Hypertension    Liver laceration 10/19/2014   Palpitations 12/12/2020   Pulmonary edema 10/19/2014   Renal disorder    Renal disorder    Right hip pain 04/03/2021   Sleep apnea    suppose to wear mask but I don't (11/22/2014)   STEMI (ST elevation myocardial infarction) (HCC) 10/19/2014   Type II diabetes mellitus (HCC)    Urticaria    Past Surgical History:  Procedure Laterality Date   ABDOMINAL HYSTERECTOMY  ~ 2000   APPLICATION OF WOUND VAC  10/2014   over my naval (11/22/2014)   BREAST LUMPECTOMY Right 2017   BREAST LUMPECTOMY WITH AXILLARY LYMPH NODE BIOPSY     BREAST SURGERY     CESAREAN SECTION  1980; 1982   CORONARY ANGIOPLASTY WITH STENT PLACEMENT  09/2014   multiple/notes 11/22/2014   EXTRACORPOREAL CIRCULATION     Femerol artery repair Right 09/2014   pt insists it was a right femoral artery repair on 11/22/2014   ICD  IMPLANT N/A 07/27/2024   Procedure: ICD IMPLANT;  Surgeon: Waddell Danelle ORN, MD;  Location: [facility] INVASIVE CV LAB;  Service: Cardiovascular;  Laterality: N/A;   LAPAROSCOPIC ABDOMINAL EXPLORATION     LEFT HEART CATH AND CORONARY ANGIOGRAPHY N/A 12/07/2017   Procedure: LEFT HEART CATH AND CORONARY ANGIOGRAPHY;  Surgeon: Anner Alm ORN, MD;  Location: [facility] INVASIVE CV LAB;  Service: Cardiovascular;  Laterality: N/A;   LEFT VENTRICULAR ASSIST DEVICE     pt is not aware of this hx on 11/22/2014   Liver laceration repair  09/2014   related to CPR/notes 11/22/2014   RIGHT/LEFT HEART CATH AND CORONARY ANGIOGRAPHY N/A 04/21/2021   Procedure: RIGHT/LEFT HEART CATH AND CORONARY ANGIOGRAPHY;  Surgeon: Mady Bruckner, MD;  Location: [facility] INVASIVE CV LAB;  Service: Cardiovascular;  Laterality: N/A;   TEE WITHOUT CARDIOVERSION N/A 03/13/2021   Procedure: TRANSESOPHAGEAL ECHOCARDIOGRAM (TEE);  Surgeon: Loni Soyla LABOR, MD;  Location: [facility] ENDOSCOPY;  Service: Cardiology;  Laterality: N/A;   TRACHEOSTOMY  09/2014   closed on it's own when they took it out   TUBAL LIGATION  1982   Patient Active Problem List   Diagnosis Date Noted   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   Stented coronary artery 03/17/2023   Coronary artery disease involving native coronary  artery of native heart without angina pectoris 10/13/2022   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) 04/06/2022   Type 2 diabetes mellitus with hyperglycemia, without long-term current use of insulin  (HCC) 04/06/2022   VT (ventricular tachycardia) (HCC) 04/06/2022   Class 2 severe obesity due to excess calories with serious comorbidity and body mass index (BMI) of 36.0 to 36.9 in adult [facility]) 04/06/2022   Right hip pain 04/03/2021   Palpitations 12/12/2020   Allergic urticaria 10/31/2019   Dermographia 10/31/2019   Other allergic rhinitis 10/31/2019   Current use of beta blocker 10/31/2019   Allergy to insect bites 10/31/2019   Hx of colonic polyp  01/03/2018   Angina, class III (HCC) 12/07/2017   Abnormal nuclear stress test 12/07/2017   Type II diabetes mellitus (HCC)    Sleep apnea    Renal disorder    History of gout    Pure hypercholesterolemia    Heart murmur    GERD (gastroesophageal reflux disease)    CAD S/P percutaneous coronary angioplasty    [facility]cer [facility])    Malignant neoplasm of upper-outer quadrant of right breast in female, estrogen receptor positive (HCC)    Hyperlipidemia LDL goal <70 11/16/2016   History of breast cancer 01/24/2016   Abnormal finding on mammography 01/17/2016   Chest pain 06/06/2015   Diarrhea 06/06/2015   Essential hypertension 06/06/2015   Depression    Encounter for therapeutic drug monitoring 02/19/2015   Mitral regurgitation 01/30/2015   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) 01/30/2015   Chronic combined systolic and diastolic heart failure (HCC) 01/30/2015   Dyspnea 11/24/2014   Cardiomyopathy (HCC) 11/24/2014   DVT (deep venous thrombosis) (HCC) 11/24/2014   DM type 2 (diabetes mellitus, type 2) (HCC) 11/24/2014   HCAP (healthcare-associated pneumonia) 11/22/2014   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) 11/04/2014   Controlled type 2 diabetes mellitus without complication (HCC) 10/31/2014   High sodium levels 10/29/2014   Elevated WBC count 10/29/2014   Acute confusion due to medical condition 10/27/2014   Insomnia due to medical condition 10/27/2014   Acute respiratory failure (HCC) 10/24/2014   Injury of kidney 10/20/2014   Laceration of liver 10/20/2014   STEMI (ST elevation myocardial infarction) (HCC) 10/19/2014   Pulmonary edema 10/19/2014   Liver laceration 10/19/2014   History of blood transfusion 09/30/2014   Diabetes mellitus (HCC) 04/19/2013    PCP: Rojelio Daring, NP  REFERRING PROVIDER: Mackey Chad, MD  REFERRING DIAG: I89.0  THERAPY DIAG:  Malignant neoplasm of upper-outer quadrant of right breast in female,  estrogen receptor positive (HCC)  Aftercare following surgery for neoplasm  Disorder of the skin and subcutaneous tissue related to radiation, unspecified  Lymphedema, not elsewhere classified  Stiffness of right shoulder, not elsewhere classified  ONSET DATE: 2017  Rationale for Evaluation and Treatment: Rehabilitation  SUBJECTIVE:  SUBJECTIVE STATEMENT: I have limited ROM.  It is having some swelling and pain come and go.  It was doing well and then got worse over time.  I need a new sleeve and glove.  I learned the massage but don't remember it .  The breast also swells and has pain off and on. No compression bras.    PERTINENT HISTORY: ER+ breast cancer treated in 2017 with lumpectomy with unknown lymph node status , chemotherapy, radiation. MI 2015 with stent and defibrillator   PAIN:  Are you having pain? Nothing right now.  It normally hurts in the afternoons.  If I do a lot of lifting and pulling.   NPRS scale: 8-9/10 Pain location: Rt upper arm and whole arm  Pain orientation: Right  PAIN TYPE: aching Pain description: intermittent  Aggravating factors: lifting and pulling  Relieving factors: rubbing and tylenol    PRECAUTIONS: None, can't lift more than 10# for 3 more weeks   RED FLAGS: None   WEIGHT BEARING RESTRICTIONS: No  FALLS:  Has patient fallen in last 6 months? 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  - needs help with the lifting   LEISURE: nothing now.    HAND DOMINANCE: right   PRIOR LEVEL OF FUNCTION: Independent  PATIENT GOALS: being able to use the Rt arm more    OBJECTIVE: Note: Objective measures were completed at Evaluation unless otherwise noted.  COGNITION: Overall  cognitive status: Within functional limits for tasks assessed   PALPATION: No pitting edema in the arm, no pitting in the breast,  fibrosis in the the lateral and inferior breast, tightness and decreased skin mobility under the breast and at the axilla where radiation desquamation occurred.    OBSERVATIONS / OTHER ASSESSMENTS: Rt breast nipple pulled inferiorly due to fibrosis and tightness    SENSATION: Some mild neuropathy hand and feets   POSTURE: rounded shoulders    UPPER EXTREMITY AROM/PROM:  A/PROM RIGHT   eval   Shoulder extension 47 pn  Shoulder flexion 105 pn   Shoulder abduction 80 tight   Shoulder internal rotation To L3   Shoulder external rotation     (Blank rows = not tested)  A/PROM LEFT   eval  Shoulder extension 60  Shoulder flexion 143  Shoulder abduction 150  Shoulder internal rotation T9  Shoulder external rotation 85    (Blank rows = not tested)  UPPER EXTREMITY STRENGTH:   LYMPHEDEMA ASSESSMENTS:   LANDMARK RIGHT  eval  At axilla    15 cm proximal to olecranon process 41  10 cm proximal to olecranon process 40.8  Olecranon process 32.2  15 cm proximal to ulnar styloid process 26.5  10 cm proximal to ulnar styloid process 23.1  Just proximal to ulnar styloid process 20  Across hand at thumb web space 23  At base of 2nd digit 7.8  (Blank rows = not tested)  LANDMARK LEFT  eval  At axilla    15 cm proximal to olecranon process 39.5  10 cm proximal to olecranon process 36.3  Olecranon process 31  15 cm proximal to ulnar styloid process 25.7  10 cm proximal to ulnar styloid process 22.4  Just proximal to ulnar styloid process 19.7  Across hand at thumb web space 22  At base of 2nd digit 8.0  (Blank rows = not tested)   QUICK DASH SURVEY: 61%  BREAST COMPLAINTS QUESTIONNAIRE  08/14/24 Pain:   4 Heaviness:  7 Swollen feeling: 8 Tense Skin:  6 Redness:  3 Bra Print:  6 Size of Pores:  4 Hard feeling:   8 Total:   46 /80 A  Score over 9 indicates lymphedema issues in the breast                                                                                                                           TREATMENT DATE:  Pt permission and consent throughout each step of examination and treatment with modification and draping if requested when working on sensitive areas  08/14/24 Eval performed Gave pt new prescription for compression bra and encouraged use as much as possible as the most important component of decreasing breast edema.  Also recommended swell spot.  Discussed POC and exam findings    PATIENT EDUCATION:  Education details: per today's note Person educated: Patient Education method: Medical illustrator Education comprehension: verbalized understanding  HOME EXERCISE PROGRAM: Get compression bra, swell spot   ASSESSMENT:  CLINICAL IMPRESSION: Patient is a 68 y.o. female who was seen today for physical therapy evaluation and treatment for her chronic Rt UE tightness and swelling post lumpectomy and radiation in 2017.  She has limited shoulder ROM with pain and difficulty elevating the arm without hiking the Rt shoulder.  Her swelling is mild an mostly nonfluctuating with a volume difference of between sides.  being indicative of lymphedema.  She will benefit from a compression garment for the arm as well as the breast to manage fluid and swelling.  Her biggest deficit is her upper quadrant stiffness post radiation leading to decreased arm mobility and pain.  .    OBJECTIVE IMPAIRMENTS: decreased activity tolerance, decreased knowledge of condition, decreased knowledge of use of DME, decreased ROM, decreased strength, and impaired UE functional use.   ACTIVITY LIMITATIONS: carrying and lifting  PARTICIPATION LIMITATIONS: community activity  PERSONAL FACTORS: Time since onset of injury/illness/exacerbation and 1-2 comorbidities: SLNB, past radiation are also affecting patient's  functional outcome.   REHAB POTENTIAL: Excellent  CLINICAL DECISION MAKING: Stable/uncomplicated  EVALUATION COMPLEXITY: Low  GOALS: Goals reviewed with patient? Yes   STGs = LONG TERM GOALS: Target date: 09/11/24  Pt will obtain compression bra and swell spot for breast swelling management  Baseline:  Goal status: INITIAL  2.  Pt will obtain compression sleeve for arm swelling management Baseline:  Goal status: INITIAL  3.  Pt will improve her Rt arm AROM to at least 140 deg of flexion and abduction to improve functional reach  Baseline:  Goal status: INITIAL  4.  Pt will be ind with self MLD for the Rt breast and UE Baseline:  Goal status: INITIAL   PLAN:  PT FREQUENCY: 2x/week  PT DURATION: 4 weeks  PLANNED INTERVENTIONS: 97110-Therapeutic exercises, 97530- Therapeutic activity, [id number]- Neuromuscular re-education, [id number]- Self Care, [id number]- Manual therapy, Patient/Family education, Joint mobilization, Therapeutic exercises, Therapeutic activity, Neuromuscular re-education, and Self  Care  PLAN FOR NEXT SESSION: Rt shoulder AAROM (pulleys, etc), PROM, STM Rt upper quadrant, eventually order sleeve and teach self MLD.    Larue Saddie SAUNDERS, PT 08/14/2024, 11:56 AM

## 2024-08-16 ENCOUNTER — Encounter (HOSPITAL_BASED_OUTPATIENT_CLINIC_OR_DEPARTMENT_OTHER): Payer: Self-pay | Admitting: Cardiovascular Disease

## 2024-08-16 ENCOUNTER — Ambulatory Visit

## 2024-08-16 DIAGNOSIS — I472 Ventricular tachycardia, unspecified: Secondary | ICD-10-CM

## 2024-08-16 DIAGNOSIS — Z483 Aftercare following surgery for neoplasm: Secondary | ICD-10-CM

## 2024-08-16 DIAGNOSIS — I890 Lymphedema, not elsewhere classified: Secondary | ICD-10-CM

## 2024-08-16 DIAGNOSIS — C50411 Malignant neoplasm of upper-outer quadrant of right female breast: Secondary | ICD-10-CM | POA: Diagnosis not present

## 2024-08-16 DIAGNOSIS — M25611 Stiffness of right shoulder, not elsewhere classified: Secondary | ICD-10-CM

## 2024-08-16 DIAGNOSIS — Z170 Estrogen receptor positive status [ER+]: Secondary | ICD-10-CM

## 2024-08-16 DIAGNOSIS — R002 Palpitations: Secondary | ICD-10-CM

## 2024-08-16 DIAGNOSIS — L599 Disorder of the skin and subcutaneous tissue related to radiation, unspecified: Secondary | ICD-10-CM

## 2024-08-16 NOTE — Therapy (Signed)
 OUTPATIENT PHYSICAL THERAPY  UPPER EXTREMITY ONCOLOGY TREATMENT  Patient Name: Marie Tanner MRN: [id number] DOB:[birth year], 68 y.o., female Today's Date: 08/16/2024  END OF SESSION:  PT End of Session - 08/16/24 1002     Visit Number 2    Number of Visits 9    Date for PT Re-Evaluation 09/11/24    PT Start Time 1002    PT Stop Time 1054    PT Time Calculation (min) 52 min    Activity Tolerance Patient tolerated treatment well    Behavior During Therapy [facility] for tasks assessed/performed          Past Medical History:  Diagnosis Date   Breast cancer (HCC)    Cancer (HCC)    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   GERD (gastroesophageal reflux disease)    HCAP (healthcare-associated pneumonia) 11/22/2014   Heart murmur    High cholesterol    History of blood transfusion 09/2014   related to MI   History of gout    Hypertension    Liver laceration 10/19/2014   Palpitations 12/12/2020   Pulmonary edema 10/19/2014   Renal disorder    Renal disorder    Right hip pain 04/03/2021   Sleep apnea    suppose to wear mask but I don't (11/22/2014)   STEMI (ST elevation myocardial infarction) (HCC) 10/19/2014   Type II diabetes mellitus (HCC)    Urticaria    Past Surgical History:  Procedure Laterality Date   ABDOMINAL HYSTERECTOMY  ~ 2000   APPLICATION OF WOUND VAC  10/2014   over my naval (11/22/2014)   BREAST LUMPECTOMY Right 2017   BREAST LUMPECTOMY WITH AXILLARY LYMPH NODE BIOPSY     BREAST SURGERY     CESAREAN SECTION  1980; 1982   CORONARY ANGIOPLASTY WITH STENT PLACEMENT  09/2014   multiple/notes 11/22/2014   EXTRACORPOREAL CIRCULATION     Femerol artery repair Right 09/2014   pt insists it was a right femoral artery repair on 11/22/2014   ICD IMPLANT N/A 07/27/2024   Procedure:  ICD IMPLANT;  Surgeon: Waddell Danelle ORN, MD;  Location: [facility] INVASIVE CV LAB;  Service: Cardiovascular;  Laterality: N/A;   LAPAROSCOPIC ABDOMINAL EXPLORATION     LEFT HEART CATH AND CORONARY ANGIOGRAPHY N/A 12/07/2017   Procedure: LEFT HEART CATH AND CORONARY ANGIOGRAPHY;  Surgeon: Anner Alm ORN, MD;  Location: [facility] INVASIVE CV LAB;  Service: Cardiovascular;  Laterality: N/A;   LEFT VENTRICULAR ASSIST DEVICE     pt is not aware of this hx on 11/22/2014   Liver laceration repair  09/2014   related to CPR/notes 11/22/2014   RIGHT/LEFT HEART CATH AND CORONARY ANGIOGRAPHY N/A 04/21/2021   Procedure: RIGHT/LEFT HEART CATH AND CORONARY ANGIOGRAPHY;  Surgeon: Mady Bruckner, MD;  Location: [facility] INVASIVE CV LAB;  Service: Cardiovascular;  Laterality: N/A;   TEE WITHOUT CARDIOVERSION N/A 03/13/2021   Procedure: TRANSESOPHAGEAL ECHOCARDIOGRAM (TEE);  Surgeon: Loni Soyla LABOR, MD;  Location: [facility] ENDOSCOPY;  Service: Cardiology;  Laterality: N/A;   TRACHEOSTOMY  09/2014   closed on it's own when they took it out   TUBAL LIGATION  1982   Patient Active Problem List   Diagnosis Date Noted   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   Stented coronary artery 03/17/2023   Coronary artery disease involving native coronary artery of native heart without angina pectoris  10/13/2022   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) 04/06/2022   Type 2 diabetes mellitus with hyperglycemia, without long-term current use of insulin  (HCC) 04/06/2022   VT (ventricular tachycardia) (HCC) 04/06/2022   Class 2 severe obesity due to excess calories with serious comorbidity and body mass index (BMI) of 36.0 to 36.9 in [facility]) 04/06/2022   Right hip pain 04/03/2021   Palpitations 12/12/2020   Allergic urticaria 10/31/2019   Dermographia 10/31/2019   Other allergic rhinitis 10/31/2019   Current use of beta blocker 10/31/2019   Allergy to insect bites 10/31/2019   Hx of colonic polyp 01/03/2018   Angina, class III (HCC)  12/07/2017   Abnormal nuclear stress test 12/07/2017   Type II diabetes mellitus (HCC)    Sleep apnea    Renal disorder    History of gout    Pure hypercholesterolemia    Heart murmur    GERD (gastroesophageal reflux disease)    CAD S/P percutaneous coronary angioplasty    Cancer [facility])    Malignant neoplasm of upper-outer quadrant of right breast in female, estrogen receptor positive (HCC)    Hyperlipidemia LDL goal <70 11/16/2016   History of breast cancer 01/24/2016   Abnormal finding on mammography 01/17/2016   Chest pain 06/06/2015   Diarrhea 06/06/2015   Essential hypertension 06/06/2015   Depression    Encounter for therapeutic drug monitoring 02/19/2015   Mitral regurgitation 01/30/2015   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) 01/30/2015   Chronic combined systolic and diastolic heart failure (HCC) 01/30/2015   Dyspnea 11/24/2014   Cardiomyopathy (HCC) 11/24/2014   DVT (deep venous thrombosis) (HCC) 11/24/2014   DM type 2 (diabetes mellitus, type 2) (HCC) 11/24/2014   HCAP (healthcare-associated pneumonia) 11/22/2014   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) 11/04/2014   Controlled type 2 diabetes mellitus without complication (HCC) 10/31/2014   High sodium levels 10/29/2014   Elevated WBC count 10/29/2014   Acute confusion due to medical condition 10/27/2014   Insomnia due to medical condition 10/27/2014   Acute respiratory failure (HCC) 10/24/2014   Injury of kidney 10/20/2014   Laceration of liver 10/20/2014   STEMI (ST elevation myocardial infarction) (HCC) 10/19/2014   Pulmonary edema 10/19/2014   Liver laceration 10/19/2014   History of blood transfusion 09/30/2014   Diabetes mellitus (HCC) 04/19/2013    PCP: Rojelio Daring, NP  REFERRING PROVIDER: Mackey Chad, MD  REFERRING DIAG: I89.0  THERAPY DIAG:  Malignant neoplasm of upper-outer quadrant of right breast in female, estrogen receptor positive  (HCC)  Aftercare following surgery for neoplasm  Disorder of the skin and subcutaneous tissue related to radiation, unspecified  Lymphedema, not elsewhere classified  Stiffness of right shoulder, not elsewhere classified  ONSET DATE: 2017  Rationale for Evaluation and Treatment: Rehabilitation  SUBJECTIVE:  SUBJECTIVE STATEMENT:  I have an appt for Compression bra on Monday.   EVAL I have limited ROM.  It is having some swelling and pain come and go.  It was doing well and then got worse over time.  I need a new sleeve and glove.  I learned the massage but don't remember it .  The breast also swells and has pain off and on. No compression bras.    PERTINENT HISTORY: ER+ breast cancer treated in 2017 with lumpectomy with unknown lymph node status , chemotherapy, radiation. MI 2015 with stent and defibrillator   PAIN:  Are you having pain? Nothing right now.  It normally hurts in the afternoons.  If I do a lot of lifting and pulling.   NPRS scale: 4/10 Pain location: Rt upper arm and whole arm  Pain orientation: Right  PAIN TYPE: aching Pain description: intermittent  Aggravating factors: lifting and pulling  Relieving factors: rubbing and tylenol    PRECAUTIONS: None, can't lift more than 10# for 3 more weeks   RED FLAGS: None   WEIGHT BEARING RESTRICTIONS: No  FALLS:  Has patient fallen in last 6 months? 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  - needs help with the lifting   LEISURE: nothing now.    HAND DOMINANCE: right   PRIOR LEVEL OF FUNCTION: Independent  PATIENT GOALS: being able to use the Rt arm more    OBJECTIVE: Note: Objective measures were completed at Evaluation unless otherwise  noted.  COGNITION: Overall cognitive status: Within functional limits for tasks assessed   PALPATION: No pitting edema in the arm, no pitting in the breast,  fibrosis in the the lateral and inferior breast, tightness and decreased skin mobility under the breast and at the axilla where radiation desquamation occurred.    OBSERVATIONS / OTHER ASSESSMENTS: Rt breast nipple pulled inferiorly due to fibrosis and tightness    SENSATION: Some mild neuropathy hand and feets   POSTURE: rounded shoulders    UPPER EXTREMITY AROM/PROM:  A/PROM RIGHT   eval   Shoulder extension 47 pn  Shoulder flexion 105 pn   Shoulder abduction 80 tight   Shoulder internal rotation To L3   Shoulder external rotation     (Blank rows = not tested)  A/PROM LEFT   eval  Shoulder extension 60  Shoulder flexion 143  Shoulder abduction 150  Shoulder internal rotation T9  Shoulder external rotation 85    (Blank rows = not tested)  UPPER EXTREMITY STRENGTH:   LYMPHEDEMA ASSESSMENTS:   LANDMARK RIGHT  eval  At axilla    15 cm proximal to olecranon process 41  10 cm proximal to olecranon process 40.8  Olecranon process 32.2  15 cm proximal to ulnar styloid process 26.5  10 cm proximal to ulnar styloid process 23.1  Just proximal to ulnar styloid process 20  Across hand at thumb web space 23  At base of 2nd digit 7.8  (Blank rows = not tested)  LANDMARK LEFT  eval  At axilla    15 cm proximal to olecranon process 39.5  10 cm proximal to olecranon process 36.3  Olecranon process 31  15 cm proximal to ulnar styloid process 25.7  10 cm proximal to ulnar styloid process 22.4  Just proximal to ulnar styloid process 19.7  Across hand at thumb web space 22  At base of 2nd digit 8.0  (Blank rows =  not tested)   QUICK DASH SURVEY: 61%  BREAST COMPLAINTS QUESTIONNAIRE    08/14/24 Pain:   4 Heaviness:  7 Swollen feeling: 8 Tense Skin:  6 Redness:  3 Bra Print:  6 Size of Pores:  4 Hard  feeling:   8 Total:   46 /80 A Score over 9 indicates lymphedema issues in the breast                                                                                                                           TREATMENT DATE:  Pt permission and consent throughout each step of examination and treatment with modification and draping if requested when working on sensitive areas 08/16/2024 Supine wand flex x 5 to 130 degrees, scaption x 2 limited by pain/popping so discontinued Stargazer x 5 Scapular retraction in sitting x 5 Standing wall slide for scaption; not able to go very high but advised not to let shoulder ride up and not to work through pain STM to right UT, pectorals and mid delt with cocoa butter to decrease tissue tension PROM to pts right shoulder with VC's to relax;flexion, scaption, abd, IR and ER   08/14/24 Eval performed Gave pt new prescription for compression bra and encouraged use as much as possible as the most important component of decreasing breast edema.  Also recommended swell spot.  Discussed POC and exam findings    PATIENT EDUCATION:  Education details: per today's note Person educated: Patient Education method: Medical illustrator Education comprehension: verbalized understanding  HOME EXERCISE PROGRAM: Get compression bra, swell spot   ASSESSMENT:  CLINICAL IMPRESSION: Initiated AAROM, PROM and STM to pts right shoulder.Pt with significant increased tissue tension especially in mid deltoid. Able to achieve 130 degrees of AAROM for flexion, however had difficulty with scaption with audible popping noted. Updated HEP  EVAL Patient is a 68 y.o. female who was seen today for physical therapy evaluation and treatment for her chronic Rt UE tightness and swelling post lumpectomy and radiation in 2017.  She has limited shoulder ROM with pain and difficulty elevating the arm without hiking the Rt shoulder.  Her swelling is mild an mostly nonfluctuating  with a volume difference of between sides.  being indicative of lymphedema.  She will benefit from a compression garment for the arm as well as the breast to manage fluid and swelling.  Her biggest deficit is her upper quadrant stiffness post radiation leading to decreased arm mobility and pain.  .    OBJECTIVE IMPAIRMENTS: decreased activity tolerance, decreased knowledge of condition, decreased knowledge of use of DME, decreased ROM, decreased strength, and impaired UE functional use.   ACTIVITY LIMITATIONS: carrying and lifting  PARTICIPATION LIMITATIONS: community activity  PERSONAL FACTORS: Time since onset of injury/illness/exacerbation and 1-2 comorbidities: SLNB, past radiation are also affecting patient's functional outcome.   REHAB POTENTIAL: Excellent  CLINICAL DECISION MAKING: Stable/uncomplicated  EVALUATION COMPLEXITY: Low  GOALS: Goals reviewed with patient? Yes  STGs = LONG TERM GOALS: Target date: 09/11/24  Pt will obtain compression bra and swell spot for breast swelling management  Baseline:  Goal status: INITIAL  2.  Pt will obtain compression sleeve for arm swelling management Baseline:  Goal status: INITIAL  3.  Pt will improve her Rt arm AROM to at least 140 deg of flexion and abduction to improve functional reach  Baseline:  Goal status: INITIAL  4.  Pt will be ind with self MLD for the Rt breast and UE Baseline:  Goal status: INITIAL   PLAN:  PT FREQUENCY: 2x/week  PT DURATION: 4 weeks  PLANNED INTERVENTIONS: 97110-Therapeutic exercises, 97530- Therapeutic activity, 97112- Neuromuscular re-education, 97535- Self Care, [id number]- Manual therapy, Patient/Family education, Joint mobilization, Therapeutic exercises, Therapeutic activity, Neuromuscular re-education, and Self Care  PLAN FOR NEXT SESSION: Rt shoulder AAROM (pulleys, etc), PROM, STM Rt upper quadrant, eventually order sleeve and teach self MLD.    Grayce JINNY Sheldon,  PT 08/16/2024, 10:55 AM

## 2024-08-16 NOTE — Patient Instructions (Signed)
 SHOULDER: Flexion - Supine (Cane)        Cancer Rehab [phone]    Hold cane in both hands. Raise arms up overhead. Do not allow back to arch. Hold _5__ seconds. Do __5__ times; __2__ times a day.    Shoulder Blade Stretch    Clasp fingers behind head with elbows touching in front of face. Pull elbows back while pressing shoulder blades together. Relax and hold as tolerated, can place pillow under elbow here for comfort as needed and to allow for prolonged stretch.  Repeat __5__ times. Do __2__ sessions per day.    Copyright  VHI. All rights reserved.

## 2024-08-17 NOTE — Progress Notes (Signed)
 Post-op visit (D0171) [facility] 08/17/24 No primary care provider on file. Treatment Providers Hampton Slade, DDS  Dental procedures in this visit  . I9828 - RE-EVALUATION - POST-OPERATIVE OFFICE VISIT  . I9779 - SINGLE X-RAY    HEALTH HISTORY ? Vitals:  BP Readings from Last 1 Encounters:  08/17/24 121/79    Pulse:  70 Medical history was reviewed and updated. No contraindication to care. Medical History[1] Surgical History[2] Social History   Tobacco Use  . Smoking status: Never  . Smokeless tobacco: Never  Substance Use Topics  . Alcohol use: Never   Family History[3] Medications Ordered Prior to Encounter[4] Medical Risk Assessment ASA GRADE: ASA 2 - Patient with mild systemic disease with no functional limitations  Subjective: Patient reports no pain, bump came back per patient.  Exam reveals lingual abscess developing at the gingival margin of #30.     Objective:  Radiographs taken: 1 PAs of area: 30  Additional findings/observations: pt was previously prescribed Keflex and infection is developing again.   Assessment: pt was previously prescribed Keflex and infection is developing again.   Plan: Pt to take antibiotic as directed. Pt does want tor proceed with treatment for crown on #30 but we need to confirm abscess will not continue to come back. Patient will call us  to either schedule a re-evaluation or to start on treatment for crown.   Additional notes: None NV: Dr. appointment: TBD  Treatment Providers DA: Hailey Hill Hampton Slade, DDS [facility] [address] [place] [id number] [phone]        [1] Past Medical History: Diagnosis Date  . Cancer (CMS/HCC)   . Cardiovascular disease   . Depression 08/17/2022  . Diabetes mellitus (CMS/HCC)   . GERD (gastroesophageal reflux disease) 06/18/2023  . Heart murmur 08/17/2022  . History of blood transfusion  09/30/2014   related to MI  . History of heart attack   . Hypertension   . Pulmonary edema 10/19/2014  [2] Past Surgical History: Procedure Laterality Date  . CARDIAC SURGERY    [3] No family history on file. [4] Current Outpatient Medications on File Prior to Visit  Medication Sig Dispense Refill  . triamcinolone  (KENALOG ) 0.1 % cream Apply 1 Application topically.    . acetaminophen  (TYLENOL ) 500 mg tablet Take 1,000 mg by mouth.    . acetaminophen  500 mg capsule Take 500 mg by mouth.    . albuterol  HFA (PROVENTIL  HFA;VENTOLIN  HFA) 90 mcg/actuation inhaler Inhale 2 puffs every 6 (six) hours if needed.    SABRA arm brace (Elbow Compression Sleeve) misc Compression sleeve for lymphatic drainage to wear nightly    . aspirin  81 mg tablet Take 1 tablet by mouth 1 (one) time each day.    . calcium  carbonate-vitamin D3 (Oysco 500/D) 500 mg-5 mcg (200 unit) tablet Take 1 tablet by mouth.    . carvediloL  (COREG ) 12.5 mg tablet Take 1 tablet by mouth in the morning and at bedtime.    . cetirizine (ZyrTEC) 10 mg tablet Take 10 mg by mouth 1 (one) time each day.    . cholecalciferol (VITAMIN D-3) 25 mcg (1,000 unit) tablet Take 1,000 Units by mouth.    . dulaglutide  (Trulicity ) 3 mg/0.5 mL pen injector Inject 3 mg under the skin.    . Entresto  97-103 mg tablet Take 1 tablet by mouth in the morning and at bedtime.    . ergocalciferol (VITAMIN D-2) 1,250  mcg (50,000 unit) capsule TAKE 1 CAPSULE BY MOUTH EVERY WEEK FOR 6 WEEKS    . exemestane (AROMASIN) 25 mg chemo tablet take 1 tablet (25 mg) by oral route once daily after a meal    . fluticasone  propionate (FLONASE ) 50 mcg/actuation nasal spray Administer 1 spray into affected nostril(s) 1 (one) time each day.    . furosemide  40 mg/4 mL solution 1 mL.    . gabapentin (NEURONTIN) 100 mg capsule TAKE 1 CAPSULE BY MOUTH AT BEDTIME FOR BACK/NEUROPATHIC PAIN    . glucose monitoring kit (FREESTYLE) monitoring kit by Not Applicable route.    . ketoconazole   (NIZORAL ) 2 % cream Apply to both feet and between toes once daily for 6 weeks.    . magnesium  oxide (MAG-OX) 400 mg (241.3 mg magnesium ) tablet Take 1,200 mg by mouth 1 (one) time each day.    . metoprolol  succinate (TOPROL -XL) 50 mg 24 hr tablet Take 50 mg by mouth 1 (one) time each day.    . mycophenolate  (CELLCEPT ) 500 mg tablet Take 1,500 mg by mouth.    . nitroglycerin  (NITROSTAT ) 0.4 mg SL tablet PLEASE SEE ATTACHED FOR DETAILED DIRECTIONS    . pantoprazole  (PROTONIX ) 40 mg EC tablet Take 40 mg by mouth.    . predniSONE  (DELTASONE ) 20 mg tablet Take 20 mg by mouth.    . Repatha  SureClick 140 mg/mL pen injector Inject 140 mg under the skin.    . rosuvastatin  (CRESTOR ) 20 mg tablet Take 1 tablet by mouth 3 times per week    . spironolactone  (ALDACTONE ) 25 mg tablet Take 25 mg by mouth.    . sulfamethoxazole -trimethoprim  (BACTRIM  DS,SEPTRA  DS) 800-160 mg tablet Take 1 tablet by mouth.    . ticagrelor  60 mg tablet Take 60 mg by mouth.    . Trulicity  4.5 mg/0.5 mL pen injector Inject 4.5 mg as directed.     No current facility-administered medications on file prior to visit.

## 2024-08-18 NOTE — Telephone Encounter (Signed)
 Bactrim  has been known to cause hyperkalemia, which can then cause palpitations.  Would recommend getting BMET to check this.  She may need to talk to prescriber of bactrim  for another product.

## 2024-08-21 ENCOUNTER — Encounter: Payer: Self-pay | Admitting: Rehabilitation

## 2024-08-21 ENCOUNTER — Ambulatory Visit: Admitting: Rehabilitation

## 2024-08-21 DIAGNOSIS — M25611 Stiffness of right shoulder, not elsewhere classified: Secondary | ICD-10-CM

## 2024-08-21 DIAGNOSIS — L599 Disorder of the skin and subcutaneous tissue related to radiation, unspecified: Secondary | ICD-10-CM

## 2024-08-21 DIAGNOSIS — C50411 Malignant neoplasm of upper-outer quadrant of right female breast: Secondary | ICD-10-CM | POA: Diagnosis not present

## 2024-08-21 DIAGNOSIS — I890 Lymphedema, not elsewhere classified: Secondary | ICD-10-CM

## 2024-08-21 DIAGNOSIS — Z483 Aftercare following surgery for neoplasm: Secondary | ICD-10-CM

## 2024-08-21 DIAGNOSIS — Z170 Estrogen receptor positive status [ER+]: Secondary | ICD-10-CM

## 2024-08-21 NOTE — Therapy (Signed)
 OUTPATIENT PHYSICAL THERAPY  UPPER EXTREMITY ONCOLOGY TREATMENT  Patient Name: Marie Tanner MRN: [id number] DOB:1956-03-17, 68 y.o., female Today's Date: 08/21/2024  END OF SESSION:  PT End of Session - 08/21/24 1211     Visit Number 3    Number of Visits 9    Date for Recertification  09/11/24    PT Start Time 1015    PT Stop Time 1058    PT Time Calculation (min) 43 min    Activity Tolerance Patient tolerated treatment well    Behavior During Therapy [facility] for tasks assessed/performed           Past Medical History:  Diagnosis Date   Breast cancer (HCC)    Cancer (HCC)    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   GERD (gastroesophageal reflux disease)    HCAP (healthcare-associated pneumonia) 11/22/2014   Heart murmur    High cholesterol    History of blood transfusion 09/2014   related to MI   History of gout    Hypertension    Liver laceration 10/19/2014   Palpitations 12/12/2020   Pulmonary edema 10/19/2014   Renal disorder    Renal disorder    Right hip pain 04/03/2021   Sleep apnea    suppose to wear mask but I don't (11/22/2014)   STEMI (ST elevation myocardial infarction) (HCC) 10/19/2014   Type II diabetes mellitus (HCC)    Urticaria    Past Surgical History:  Procedure Laterality Date   ABDOMINAL HYSTERECTOMY  ~ 2000   APPLICATION OF WOUND VAC  10/2014   over my naval (11/22/2014)   BREAST LUMPECTOMY Right 2017   BREAST LUMPECTOMY WITH AXILLARY LYMPH NODE BIOPSY     BREAST SURGERY     CESAREAN SECTION  1980; 1982   CORONARY ANGIOPLASTY WITH STENT PLACEMENT  09/2014   multiple/notes 11/22/2014   EXTRACORPOREAL CIRCULATION     Femerol artery repair Right 09/2014   pt insists it was a right femoral artery repair on 11/22/2014   ICD IMPLANT N/A 07/27/2024   Procedure:  ICD IMPLANT;  Surgeon: Waddell Danelle ORN, MD;  Location: [facility] INVASIVE CV LAB;  Service: Cardiovascular;  Laterality: N/A;   LAPAROSCOPIC ABDOMINAL EXPLORATION     LEFT HEART CATH AND CORONARY ANGIOGRAPHY N/A 12/07/2017   Procedure: LEFT HEART CATH AND CORONARY ANGIOGRAPHY;  Surgeon: Anner Alm ORN, MD;  Location: [facility] INVASIVE CV LAB;  Service: Cardiovascular;  Laterality: N/A;   LEFT VENTRICULAR ASSIST DEVICE     pt is not aware of this hx on 11/22/2014   Liver laceration repair  09/2014   related to CPR/notes 11/22/2014   RIGHT/LEFT HEART CATH AND CORONARY ANGIOGRAPHY N/A 04/21/2021   Procedure: RIGHT/LEFT HEART CATH AND CORONARY ANGIOGRAPHY;  Surgeon: Mady Bruckner, MD;  Location: [facility] INVASIVE CV LAB;  Service: Cardiovascular;  Laterality: N/A;   TEE WITHOUT CARDIOVERSION N/A 03/13/2021   Procedure: TRANSESOPHAGEAL ECHOCARDIOGRAM (TEE);  Surgeon: Loni Soyla LABOR, MD;  Location: [facility] ENDOSCOPY;  Service: Cardiology;  Laterality: N/A;   TRACHEOSTOMY  09/2014   closed on it's own when they took it out   TUBAL LIGATION  1982   Patient Active Problem List   Diagnosis Date Noted   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   Stented coronary artery 03/17/2023   Coronary artery disease involving native coronary artery of native heart without angina  pectoris 10/13/2022   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) 04/06/2022   Type 2 diabetes mellitus with hyperglycemia, without long-term current use of insulin  (HCC) 04/06/2022   VT (ventricular tachycardia) (HCC) 04/06/2022   Class 2 severe obesity due to excess calories with serious comorbidity and body mass index (BMI) of 36.0 to 36.9 in adult [facility]) 04/06/2022   Right hip pain 04/03/2021   Palpitations 12/12/2020   Allergic urticaria 10/31/2019   Dermographia 10/31/2019   Other allergic rhinitis 10/31/2019   Current use of beta blocker 10/31/2019   Allergy to insect bites 10/31/2019   Hx of colonic polyp 01/03/2018   Angina, class III (HCC)  12/07/2017   Abnormal nuclear stress test 12/07/2017   Type II diabetes mellitus (HCC)    Sleep apnea    Renal disorder    History of gout    Pure hypercholesterolemia    Heart murmur    GERD (gastroesophageal reflux disease)    CAD S/P percutaneous coronary angioplasty    Cancer [facility])    Malignant neoplasm of upper-outer quadrant of right breast in female, estrogen receptor positive (HCC)    Hyperlipidemia LDL goal <70 11/16/2016   History of breast cancer 01/24/2016   Abnormal finding on mammography 01/17/2016   Chest pain 06/06/2015   Diarrhea 06/06/2015   Essential hypertension 06/06/2015   Depression    Encounter for therapeutic drug monitoring 02/19/2015   Mitral regurgitation 01/30/2015   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) 01/30/2015   Chronic combined systolic and diastolic heart failure (HCC) 01/30/2015   Dyspnea 11/24/2014   Cardiomyopathy (HCC) 11/24/2014   DVT (deep venous thrombosis) (HCC) 11/24/2014   DM type 2 (diabetes mellitus, type 2) (HCC) 11/24/2014   HCAP (healthcare-associated pneumonia) 11/22/2014   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) 11/04/2014   Controlled type 2 diabetes mellitus without complication (HCC) 10/31/2014   High sodium levels 10/29/2014   Elevated WBC count 10/29/2014   Acute confusion due to medical condition 10/27/2014   Insomnia due to medical condition 10/27/2014   Acute respiratory failure (HCC) 10/24/2014   Injury of kidney 10/20/2014   Laceration of liver 10/20/2014   STEMI (ST elevation myocardial infarction) (HCC) 10/19/2014   Pulmonary edema 10/19/2014   Liver laceration 10/19/2014   History of blood transfusion 09/30/2014   Diabetes mellitus (HCC) 04/19/2013    PCP: Rojelio Daring, NP  REFERRING PROVIDER: Mackey Chad, MD  REFERRING DIAG: I89.0  THERAPY DIAG:  Malignant neoplasm of upper-outer quadrant of right breast in female, estrogen receptor positive  (HCC)  Aftercare following surgery for neoplasm  Disorder of the skin and subcutaneous tissue related to radiation, unspecified  Lymphedema, not elsewhere classified  Stiffness of right shoulder, not elsewhere classified  ONSET DATE: 2017  Rationale for Evaluation and Treatment: Rehabilitation  SUBJECTIVE:  SUBJECTIVE STATEMENT:  Nothing new.  I was sore after therapy maybe a bit too much  EVAL I have limited ROM.  It is having some swelling and pain come and go.  It was doing well and then got worse over time.  I need a new sleeve and glove.  I learned the massage but don't remember it .  The breast also swells and has pain off and on. No compression bras.    PERTINENT HISTORY: Rt ER+ breast cancer treated in 2017 with lumpectomy with unknown lymph node status , chemotherapy, radiation. MI 2015 with stent and defibrillator   PAIN:  Are you having pain? It normally hurts in the afternoons.  If I do a lot of lifting and pulling.   NPRS scale: 3/10 Pain location: Rt upper arm and whole arm  Pain orientation: Right  PAIN TYPE: aching Pain description: intermittent  Aggravating factors: lifting and pulling  Relieving factors: rubbing and tylenol    PRECAUTIONS: None, can't lift more than 10# for 3 more weeks   RED FLAGS: None   WEIGHT BEARING RESTRICTIONS: No  FALLS:  Has patient fallen in last 6 months? 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  - needs help with the lifting   LEISURE: nothing now.    HAND DOMINANCE: right   PRIOR LEVEL OF FUNCTION: Independent  PATIENT GOALS: being able to use the Rt arm more    OBJECTIVE: Note: Objective measures were completed at Evaluation unless otherwise  noted.  COGNITION: Overall cognitive status: Within functional limits for tasks assessed   PALPATION: No pitting edema in the arm, no pitting in the breast,  fibrosis in the the lateral and inferior breast, tightness and decreased skin mobility under the breast and at the axilla where radiation desquamation occurred.    OBSERVATIONS / OTHER ASSESSMENTS: Rt breast nipple pulled inferiorly due to fibrosis and tightness    SENSATION: Some mild neuropathy hand and feets   POSTURE: rounded shoulders    UPPER EXTREMITY AROM/PROM:  A/PROM RIGHT   eval   Shoulder extension 47 pn  Shoulder flexion 105 pn   Shoulder abduction 80 tight   Shoulder internal rotation To L3   Shoulder external rotation     (Blank rows = not tested)  A/PROM LEFT   eval  Shoulder extension 60  Shoulder flexion 143  Shoulder abduction 150  Shoulder internal rotation T9  Shoulder external rotation 85    (Blank rows = not tested)  UPPER EXTREMITY STRENGTH:   LYMPHEDEMA ASSESSMENTS:   LANDMARK RIGHT  eval  At axilla    15 cm proximal to olecranon process 41  10 cm proximal to olecranon process 40.8  Olecranon process 32.2  15 cm proximal to ulnar styloid process 26.5  10 cm proximal to ulnar styloid process 23.1  Just proximal to ulnar styloid process 20  Across hand at thumb web space 23  At base of 2nd digit 7.8  (Blank rows = not tested)  LANDMARK LEFT  eval  At axilla    15 cm proximal to olecranon process 39.5  10 cm proximal to olecranon process 36.3  Olecranon process 31  15 cm proximal to ulnar styloid process 25.7  10 cm proximal to ulnar styloid process 22.4  Just proximal to ulnar styloid process 19.7  Across hand at thumb web space 22  At base of 2nd digit 8.0  (Blank rows =  not tested)   QUICK DASH SURVEY: 61%  BREAST COMPLAINTS QUESTIONNAIRE    08/14/24 Pain:   4 Heaviness:  7 Swollen feeling: 8 Tense Skin:  6 Redness:  3 Bra Print:  6 Size of Pores:  4 Hard  feeling:   8 Total:   46 /80 A Score over 9 indicates lymphedema issues in the breast                                                                                                                           TREATMENT DATE:  Pt permission and consent throughout each step of examination and treatment with modification and draping if requested when working on sensitive areas  08/21/24 Signed pt up for sleeve measurement 9/29@11am   Pulleys into flexion and abduction x each with initial cueing limited by pain into abduction so more scaption was performed Scapular retractions x 10 with cueing for motion.   Wall ball flexion x 10 Attempted ranger but this was too difficult and switched to supine Supine scaption x 10  STM to right UT, pectorals and mid delt with cocoa butter to decrease tissue tension PROM to pts right shoulder with VC's to relax;flexion, scaption, abd, IR and ER  08/16/2024 Supine wand flex x 5 to 130 degrees, scaption x 2 limited by pain/popping so discontinued Stargazer x 5 Scapular retraction in sitting x 5 Standing wall slide for scaption; not able to go very high but advised not to let shoulder ride up and not to work through pain STM to right UT, pectorals and mid delt with cocoa butter to decrease tissue tension PROM to pts right shoulder with VC's to relax;flexion, scaption, abd, IR and ER   08/14/24 Eval performed Gave pt new prescription for compression bra and encouraged use as much as possible as the most important component of decreasing breast edema.  Also recommended swell spot.  Discussed POC and exam findings    PATIENT EDUCATION:  Education details: per today's note Person educated: Patient Education method: Medical illustrator Education comprehension: verbalized understanding  HOME EXERCISE PROGRAM: Get compression bra, swell spot   ASSESSMENT:  CLINICAL IMPRESSION: Minimal tissue tension noted today in the mid deltoid.No popping  noted. Attempted ranger into scaption but this was a bit too hard.   EVAL Patient is a 68 y.o. female who was seen today for physical therapy evaluation and treatment for her chronic Rt UE tightness and swelling post lumpectomy and radiation in 2017.  She has limited shoulder ROM with pain and difficulty elevating the arm without hiking the Rt shoulder.  Her swelling is mild an mostly nonfluctuating with a volume difference of between sides.  being indicative of lymphedema.  She will benefit from a compression garment for the arm as well as the breast to manage fluid and swelling.  Her biggest deficit is her upper quadrant stiffness post radiation leading to decreased arm mobility and pain.  .    OBJECTIVE  IMPAIRMENTS: decreased activity tolerance, decreased knowledge of condition, decreased knowledge of use of DME, decreased ROM, decreased strength, and impaired UE functional use.   ACTIVITY LIMITATIONS: carrying and lifting  PARTICIPATION LIMITATIONS: community activity  PERSONAL FACTORS: Time since onset of injury/illness/exacerbation and 1-2 comorbidities: SLNB, past radiation are also affecting patient's functional outcome.   REHAB POTENTIAL: Excellent  CLINICAL DECISION MAKING: Stable/uncomplicated  EVALUATION COMPLEXITY: Low  GOALS: Goals reviewed with patient? Yes   STGs = LONG TERM GOALS: Target date: 09/11/24  Pt will obtain compression bra and swell spot for breast swelling management  Baseline:  Goal status: INITIAL  2.  Pt will obtain compression sleeve for arm swelling management Baseline:  Goal status: INITIAL  3.  Pt will improve her Rt arm AROM to at least 140 deg of flexion and abduction to improve functional reach  Baseline:  Goal status: INITIAL  4.  Pt will be ind with self MLD for the Rt breast and UE Baseline:  Goal status: INITIAL   PLAN:  PT FREQUENCY: 2x/week  PT DURATION: 4 weeks  PLANNED INTERVENTIONS: 97110-Therapeutic  exercises, 97530- Therapeutic activity, 97112- Neuromuscular re-education, 97535- Self Care, [id number]- Manual therapy, Patient/Family education, Joint mobilization, Therapeutic exercises, Therapeutic activity, Neuromuscular re-education, and Self Care  PLAN FOR NEXT SESSION: Rt shoulder AAROM (pulleys, etc), PROM, STM Rt upper quadrant, eventually order sleeve and teach self MLD.  9/29: Pt will be measured 9/29   Larue Saddie SAUNDERS, PT 08/21/2024, 12:13 PM

## 2024-08-23 ENCOUNTER — Ambulatory Visit

## 2024-08-23 DIAGNOSIS — L599 Disorder of the skin and subcutaneous tissue related to radiation, unspecified: Secondary | ICD-10-CM

## 2024-08-23 DIAGNOSIS — I890 Lymphedema, not elsewhere classified: Secondary | ICD-10-CM

## 2024-08-23 DIAGNOSIS — C50411 Malignant neoplasm of upper-outer quadrant of right female breast: Secondary | ICD-10-CM | POA: Diagnosis not present

## 2024-08-23 DIAGNOSIS — M25611 Stiffness of right shoulder, not elsewhere classified: Secondary | ICD-10-CM

## 2024-08-23 DIAGNOSIS — Z483 Aftercare following surgery for neoplasm: Secondary | ICD-10-CM

## 2024-08-23 LAB — BASIC METABOLIC PANEL WITH GFR
BUN/Creatinine Ratio: 14 (ref 12–28)
BUN: 12 mg/dL (ref 8–27)
CO2: 19 mmol/L — ABNORMAL LOW (ref 20–29)
Calcium: 10 mg/dL (ref 8.7–10.3)
Chloride: 97 mmol/L (ref 96–106)
Creatinine, Ser: 0.83 mg/dL (ref 0.57–1.00)
Glucose: 78 mg/dL (ref 70–99)
Potassium: 4.7 mmol/L (ref 3.5–5.2)
Sodium: 135 mmol/L (ref 134–144)
eGFR: 78 mL/min/1.73 (ref >59)

## 2024-08-23 NOTE — Progress Notes (Incomplete)
 ***In Progress***   Advanced Heart Failure Clinic Note   Referring Physician: Delores Rojelio Caldron, NP  Primary Care: Delores Rojelio Caldron, NP Primary Cardiologist: Dr. Gardenia  HPI:  Marie Tanner is a 68 y.o. female with coronary artery disease status post STEMI, hypertension, chronic systolic heart failure, obstructive sleep apnea not on CPAP, type 2 diabetes, history of DVT, breast cancer.  It appears that Marie Tanner's cardiac history dates back to 2015 when she had a STEMI complicated by cardiac arrest at [facility].  She received 2 stents to the left main/LAD and circumflex.  During this time she had a prolonged hospitalization requiring tracheostomy; liver laceration during CPR which required surgical repair and also developed a DVT during this time.  She underwent repeat left heart cath in January 2019 that demonstrated patent stents which were heavily calcified with other areas of nonobstructive CAD.  EF at that time was 35 to 45%.  With increasing GDMT she had improvement in EF by late 2019 to 40 to 50%.  Due to moderate to severe eccentric mitral regurgitation she had a TEE in 2022 and repeat right and left heart cath with structural evaluation in May 2022 that showed patent left main and LAD stents normal filling pressures and a normal transmitral gradient.  She was deferred for medical management only.  She had a cardiac MRI with noncoronary distribution of LGE followed by a cardiac PET that showed FDG uptake consistent with active inflammation/sarcoid. Patient now undergoing treatment for cardiac sarcoid.    Presented to AHF Clinic 07/26/24 with Dr. Gardenia. Overall she had been doing fairly well from a symptomatic standpoint; She continued to cook, clean and remained very active with her church without many issues. No PND, orthopnea or lightheadedness. Compliant with all medications & immunosuppression; no clinical signs of infection.    Today she returns to HF clinic for  pharmacist medication titration. At last visit with MD no medication changes were made.    HF Medications: Metoprolol  succinate 25 mg daily  Entresto  97/103 mg BID Spironolactone  25 mg daily  Lasix  40 mg PRN  Has the patient been experiencing any side effects to the medications prescribed?  no  Does the patient have any problems obtaining medications due to transportation or finances?   No; UHC dual complete  Understanding of regimen: good Understanding of indications: good Potential of compliance: good Patient understands to avoid NSAIDs. Patient understands to avoid decongestants.    Pertinent Lab Values: 07/11/24: Serum creatinine 0.80, BUN 12, Potassium 4.4, Sodium 139, HgB 11.9, WBC 7.8, PLT 322  Vital Signs: Weight: 194.2 lbs (last clinic weight: 193.6 lbs) Blood pressure: 142/76 (has not taken AM BP medications yet today)  Heart rate: 71   Assessment/Plan: Heart failure with reduced EF Etiology of YQ:pdryzfpr cardiomyopathy; hx of STEMI with PCI to the LAD, Lcx and LM; CMR with noncoronary distribution of LGE with active uptake via Cardiac PET.  NYHA class / AHA Stage:NYHA IIB Volume status & Diuretics: Euvolemic, Continue Lasix  40 mg PRN only (takes 3x/week) Vasodilators:Continue  Entresto  97/103 mg BID Beta-Blocker: Continue metoprolol  25 mg daily.  FMJ:Rnwupwlz spironolactone  25 mg daily  Cardiometabolic:SGLT2i led to yeast infection. Will continue to hold as she is now on immunosuppressives. Devices therapies & Valvulopathies: Drop in LVEF to 30-35% however improvement on PET to 40%. Repeat TTE 04/2024 EF 30-35%. Now s/p ICD implant. Advanced therapies:not indicated   2. Cardiac sarcoid - Vaccinations confirmed.  - PET from 02/09/23 w/ LVEF 37% - Currently on  cellcept  1500 mg BID.  - Restarted on prednisone  20mg  daily on 02/15/24.  - Continue bactrim  while on prednisone  doses >20 mg daily (3x weekly) - Continue PPI - Repeat PET scan in 04/13/24 with improvement in  FDG uptake; however, FDG still present in the apical anterior and apex.  - Repeat PET 07/26/24 with persistent FDG uptake that is unchanged to prior despite continued prednisone  dosing & cellcept . There was initially improvement in uptake from 02/09/24 to 04/13/24 on PET scans. She now only as uptake in the anteroapex/apical cap. Per Dr. Gardenia, low suspicion for this area to change in intensity.   3. Obesity - Body mass index is 35.41 kg/m. - Discussed side effects of prednisone  on weight and glucose.  - Stable; discussed importance of continued activity.    4. OSA - sleep study pending; needs split night sleep study.  - will schedule at follow up.    5. Moderate to severe MR - Moderate by TTE in 01/19/24.       Follow up 2 weeks with Dr. Glendell Tinnie Redman, PharmD, BCPS, [facility], CPP Heart Failure Clinic Pharmacist [phone]

## 2024-08-23 NOTE — Therapy (Signed)
 OUTPATIENT PHYSICAL THERAPY  UPPER EXTREMITY ONCOLOGY TREATMENT  Patient Name: Marie Tanner MRN: [id number] DOB:[birth year], 68 y.o., female Today's Date: 08/23/2024  END OF SESSION:  PT End of Session - 08/23/24 0959     Visit Number 4    Number of Visits 9    Date for Recertification  09/11/24    Authorization Type none needed    PT Start Time 1000    PT Stop Time 1101    PT Time Calculation (min) 61 min    Activity Tolerance Patient tolerated treatment well    Behavior During Therapy [facility] for tasks assessed/performed           Past Medical History:  Diagnosis Date   Breast cancer (HCC)    Cancer (HCC)    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   GERD (gastroesophageal reflux disease)    HCAP (healthcare-associated pneumonia) 11/22/2014   Heart murmur    High cholesterol    History of blood transfusion 09/2014   related to MI   History of gout    Hypertension    Liver laceration 10/19/2014   Palpitations 12/12/2020   Pulmonary edema 10/19/2014   Renal disorder    Renal disorder    Right hip pain 04/03/2021   Sleep apnea    suppose to wear mask but I don't (11/22/2014)   STEMI (ST elevation myocardial infarction) (HCC) 10/19/2014   Type II diabetes mellitus (HCC)    Urticaria    Past Surgical History:  Procedure Laterality Date   ABDOMINAL HYSTERECTOMY  ~ 2000   APPLICATION OF WOUND VAC  10/2014   over my naval (11/22/2014)   BREAST LUMPECTOMY Right 2017   BREAST LUMPECTOMY WITH AXILLARY LYMPH NODE BIOPSY     BREAST SURGERY     CESAREAN SECTION  1980; 1982   CORONARY ANGIOPLASTY WITH STENT PLACEMENT  09/2014   multiple/notes 11/22/2014   EXTRACORPOREAL CIRCULATION     Femerol artery repair Right 09/2014   pt insists it was a right femoral artery repair on 11/22/2014   ICD  IMPLANT N/A 07/27/2024   Procedure: ICD IMPLANT;  Surgeon: Waddell Danelle ORN, MD;  Location: [facility] INVASIVE CV LAB;  Service: Cardiovascular;  Laterality: N/A;   LAPAROSCOPIC ABDOMINAL EXPLORATION     LEFT HEART CATH AND CORONARY ANGIOGRAPHY N/A 12/07/2017   Procedure: LEFT HEART CATH AND CORONARY ANGIOGRAPHY;  Surgeon: Anner Alm ORN, MD;  Location: [facility] INVASIVE CV LAB;  Service: Cardiovascular;  Laterality: N/A;   LEFT VENTRICULAR ASSIST DEVICE     pt is not aware of this hx on 11/22/2014   Liver laceration repair  09/2014   related to CPR/notes 11/22/2014   RIGHT/LEFT HEART CATH AND CORONARY ANGIOGRAPHY N/A 04/21/2021   Procedure: RIGHT/LEFT HEART CATH AND CORONARY ANGIOGRAPHY;  Surgeon: Mady Bruckner, MD;  Location: [facility] INVASIVE CV LAB;  Service: Cardiovascular;  Laterality: N/A;   TEE WITHOUT CARDIOVERSION N/A 03/13/2021   Procedure: TRANSESOPHAGEAL ECHOCARDIOGRAM (TEE);  Surgeon: Loni Soyla LABOR, MD;  Location: [facility] ENDOSCOPY;  Service: Cardiology;  Laterality: N/A;   TRACHEOSTOMY  09/2014   closed on it's own when they took it out   TUBAL LIGATION  1982   Patient Active Problem List   Diagnosis Date Noted   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   Stented coronary artery 03/17/2023   Coronary artery disease involving native  coronary artery of native heart without angina pectoris 10/13/2022   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) 04/06/2022   Type 2 diabetes mellitus with hyperglycemia, without long-term current use of insulin  (HCC) 04/06/2022   VT (ventricular tachycardia) (HCC) 04/06/2022   Class 2 severe obesity due to excess calories with serious comorbidity and body mass index (BMI) of 36.0 to 36.9 in adult 04/06/2022   Right hip pain 04/03/2021   Palpitations 12/12/2020   Allergic urticaria 10/31/2019   Dermographia 10/31/2019   Other allergic rhinitis 10/31/2019   Current use of beta blocker 10/31/2019   Allergy to insect bites 10/31/2019   Hx of colonic polyp 01/03/2018    Angina, class III 12/07/2017   Abnormal nuclear stress test 12/07/2017   Type II diabetes mellitus (HCC)    Sleep apnea    Renal disorder    History of gout    Pure hypercholesterolemia    Heart murmur    GERD (gastroesophageal reflux disease)    CAD S/P percutaneous coronary angioplasty    Cancer [facility])    Malignant neoplasm of upper-outer quadrant of right breast in female, estrogen receptor positive (HCC)    Hyperlipidemia LDL goal <70 11/16/2016   History of breast cancer 01/24/2016   Abnormal finding on mammography 01/17/2016   Chest pain 06/06/2015   Diarrhea 06/06/2015   Essential hypertension 06/06/2015   Depression    Encounter for therapeutic drug monitoring 02/19/2015   Mitral regurgitation 01/30/2015   Ischemic heart disease due to coronary artery obstruction 01/30/2015   Chronic combined systolic and diastolic heart failure (HCC) 01/30/2015   Dyspnea 11/24/2014   Cardiomyopathy (HCC) 11/24/2014   DVT (deep venous thrombosis) (HCC) 11/24/2014   DM type 2 (diabetes mellitus, type 2) (HCC) 11/24/2014   HCAP (healthcare-associated pneumonia) 11/22/2014   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) 11/04/2014   Controlled type 2 diabetes mellitus without complication (HCC) 10/31/2014   High sodium levels 10/29/2014   Elevated WBC count 10/29/2014   Acute confusion due to medical condition 10/27/2014   Insomnia due to medical condition 10/27/2014   Acute respiratory failure (HCC) 10/24/2014   Injury of kidney 10/20/2014   Laceration of liver 10/20/2014   STEMI (ST elevation myocardial infarction) (HCC) 10/19/2014   Pulmonary edema 10/19/2014   Liver laceration 10/19/2014   History of blood transfusion 09/30/2014   Diabetes mellitus (HCC) 04/19/2013    PCP: Rojelio Daring, NP  REFERRING PROVIDER: Mackey Chad, MD  REFERRING DIAG: I89.0  THERAPY DIAG:  Malignant neoplasm of upper-outer quadrant of right breast in female, estrogen receptor positive  (HCC)  Aftercare following surgery for neoplasm  Disorder of the skin and subcutaneous tissue related to radiation, unspecified  Lymphedema, not elsewhere classified  Stiffness of right shoulder, not elsewhere classified  ONSET DATE: 2017  Rationale for Evaluation and Treatment: Rehabilitation  SUBJECTIVE:  SUBJECTIVE STATEMENT:  The pulleys were hard and especially with the one out to the side. I think I am able to reach a little higher now. I can go pick up my compression bra tomorrow.  EVAL I have limited ROM.  It is having some swelling and pain come and go.  It was doing well and then got worse over time.  I need a new sleeve and glove.  I learned the massage but don't remember it .  The breast also swells and has pain off and on. No compression bras.    PERTINENT HISTORY: Rt ER+ breast cancer treated in 2017 with lumpectomy with unknown lymph node status , chemotherapy, radiation. MI 2015 with stent and defibrillator   PAIN:  Are you having pain? It normally hurts in the afternoons.  If I do a lot of lifting and pulling.   NPRS scale: 3/10 Pain location: Rt upper arm /Shoulder and mid deltoid Pain orientation: Right  PAIN TYPE: aching Pain description: intermittent  Aggravating factors: lifting and pulling  Relieving factors: rubbing and tylenol    PRECAUTIONS: None, can't lift more than 10# for 3 more weeks   RED FLAGS: None   WEIGHT BEARING RESTRICTIONS: No  FALLS:  Has patient fallen in last 6 months? 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  - needs help with the lifting   LEISURE: nothing now.    HAND DOMINANCE: right   PRIOR LEVEL OF FUNCTION: Independent  PATIENT GOALS: being able to use  the Rt arm more    OBJECTIVE: Note: Objective measures were completed at Evaluation unless otherwise noted.  COGNITION: Overall cognitive status: Within functional limits for tasks assessed   PALPATION: No pitting edema in the arm, no pitting in the breast,  fibrosis in the the lateral and inferior breast, tightness and decreased skin mobility under the breast and at the axilla where radiation desquamation occurred.    OBSERVATIONS / OTHER ASSESSMENTS: Rt breast nipple pulled inferiorly due to fibrosis and tightness    SENSATION: Some mild neuropathy hand and feets   POSTURE: rounded shoulders    UPPER EXTREMITY AROM/PROM:  A/PROM RIGHT   eval   Shoulder extension 47 pn  Shoulder flexion 105 pn   Shoulder abduction 80 tight   Shoulder internal rotation To L3   Shoulder external rotation     (Blank rows = not tested)  A/PROM LEFT   eval  Shoulder extension 60  Shoulder flexion 143  Shoulder abduction 150  Shoulder internal rotation T9  Shoulder external rotation 85    (Blank rows = not tested)  UPPER EXTREMITY STRENGTH:   LYMPHEDEMA ASSESSMENTS:   LANDMARK RIGHT  eval  At axilla    15 cm proximal to olecranon process 41  10 cm proximal to olecranon process 40.8  Olecranon process 32.2  15 cm proximal to ulnar styloid process 26.5  10 cm proximal to ulnar styloid process 23.1  Just proximal to ulnar styloid process 20  Across hand at thumb web space 23  At base of 2nd digit 7.8  (Blank rows = not tested)  LANDMARK LEFT  eval  At axilla    15 cm proximal to olecranon process 39.5  10 cm proximal to olecranon process 36.3  Olecranon process 31  15 cm proximal to ulnar styloid process 25.7  10 cm proximal to ulnar styloid process 22.4  Just proximal to ulnar styloid process  19.7  Across hand at thumb web space 22  At base of 2nd digit 8.0  (Blank rows = not tested)   QUICK DASH SURVEY: 61%  BREAST COMPLAINTS  QUESTIONNAIRE    08/14/24 Pain:   4 Heaviness:  7 Swollen feeling: 8 Tense Skin:  6 Redness:  3 Bra Print:  6 Size of Pores:  4 Hard feeling:   8 Total:   46 /80 A Score over 9 indicates lymphedema issues in the breast                                                                                                                           TREATMENT DATE:  Pt permission and consent throughout each step of examination and treatment with modification and draping if requested when working on sensitive areas 08/23/2024 Pulleys into flexion and abduction x each with VC's prn to depress scapula and monitoring UT Supine serratus punch with PT guidance x 10 Supine chest press with arm on towel roll with PT guidance Supine scaption x 5 with VC's for scapular depression Standing theraband for scapular retraction and shoulder ext Bilateral UE x 10 ea with yellow;added to HEP STM to right UT, pectorals and mid delt with cocoa butter to decrease tissue tension PROM to pts right shoulder with VC's to relax;flexion, scaption, abd, IR and ER. Long axis distraction for relaxation 08/21/24 Signed pt up for sleeve measurement 9/29@11am   Pulleys into flexion and abduction x each with initial cueing limited by pain into abduction so more scaption was performed Scapular retractions x 10 with cueing for motion.   Wall ball flexion x 10 Attempted ranger but this was too difficult and switched to supine Supine scaption x 10  STM to right UT, pectorals and mid delt with cocoa butter to decrease tissue tension PROM to pts right shoulder with VC's to relax;flexion, scaption, abd, IR and ER  08/16/2024 Supine wand flex x 5 to 130 degrees, scaption x 2 limited by pain/popping so discontinued Stargazer x 5 Scapular retraction in sitting x 5 Standing wall slide for scaption; not able to go very high but advised not to let shoulder ride up and not to work through pain STM to right UT, pectorals and mid  delt with cocoa butter to decrease tissue tension PROM to pts right shoulder with VC's to relax;flexion, scaption, abd, IR and ER   08/14/24 Eval performed Gave pt new prescription for compression bra and encouraged use as much as possible as the most important component of decreasing breast edema.  Also recommended swell spot.  Discussed POC and exam findings    PATIENT EDUCATION:  Access Code: [email or web address] URL: [email or web address] Date: 08/23/2024 Prepared by: Grayce Sheldon  Exercises - Scapular Retraction with Resistance  - 1 x daily - 7 x weekly - 1 sets - 10 reps - Shoulder Extension with Resistance  - 1 x daily - 7 x weekly - 1 sets - 10 reps  Education details:  per today's note Person educated: Patient Education method: Medical illustrator Education comprehension: verbalized understanding  HOME EXERCISE PROGRAM: Get compression bra, swell spot   ASSESSMENT:  CLINICAL IMPRESSION: Pt did well with exercises performed with PT guidance required for several exercises. Able to perform scapular retraction and shoulder ext with yellow band without increased pain so added to HEP   EVAL Patient is a 68 y.o. female who was seen today for physical therapy evaluation and treatment for her chronic Rt UE tightness and swelling post lumpectomy and radiation in 2017.  She has limited shoulder ROM with pain and difficulty elevating the arm without hiking the Rt shoulder.  Her swelling is mild an mostly nonfluctuating with a volume difference of between sides.  being indicative of lymphedema.  She will benefit from a compression garment for the arm as well as the breast to manage fluid and swelling.  Her biggest deficit is her upper quadrant stiffness post radiation leading to decreased arm mobility and pain.  .    OBJECTIVE IMPAIRMENTS: decreased activity tolerance, decreased knowledge of condition, decreased knowledge of use of DME, decreased ROM, decreased  strength, and impaired UE functional use.   ACTIVITY LIMITATIONS: carrying and lifting  PARTICIPATION LIMITATIONS: community activity  PERSONAL FACTORS: Time since onset of injury/illness/exacerbation and 1-2 comorbidities: SLNB, past radiation are also affecting patient's functional outcome.   REHAB POTENTIAL: Excellent  CLINICAL DECISION MAKING: Stable/uncomplicated  EVALUATION COMPLEXITY: Low  GOALS: Goals reviewed with patient? Yes   STGs = LONG TERM GOALS: Target date: 09/11/24  Pt will obtain compression bra and swell spot for breast swelling management  Baseline:  Goal status: INITIAL  2.  Pt will obtain compression sleeve for arm swelling management Baseline:  Goal status: INITIAL  3.  Pt will improve her Rt arm AROM to at least 140 deg of flexion and abduction to improve functional reach  Baseline:  Goal status: INITIAL  4.  Pt will be ind with self MLD for the Rt breast and UE Baseline:  Goal status: INITIAL   PLAN:  PT FREQUENCY: 2x/week  PT DURATION: 4 weeks  PLANNED INTERVENTIONS: 97110-Therapeutic exercises, 97530- Therapeutic activity, 97112- Neuromuscular re-education, 97535- Self Care, [id number]- Manual therapy, Patient/Family education, Joint mobilization, Therapeutic exercises, Therapeutic activity, Neuromuscular re-education, and Self Care  PLAN FOR NEXT SESSION: Rt shoulder AAROM (pulleys, etc), PROM, STM Rt upper quadrant, eventually order sleeve and teach self MLD.  9/29: Pt will be measured 9/29   Grayce JINNY Sheldon, PT 08/23/2024, 11:11 AM

## 2024-08-24 ENCOUNTER — Ambulatory Visit: Payer: Self-pay | Admitting: Pharmacist Clinician (PhC)/ Clinical Pharmacy Specialist

## 2024-08-28 ENCOUNTER — Encounter: Payer: Self-pay | Admitting: Rehabilitation

## 2024-08-28 ENCOUNTER — Ambulatory Visit: Admitting: Rehabilitation

## 2024-08-28 DIAGNOSIS — Z483 Aftercare following surgery for neoplasm: Secondary | ICD-10-CM

## 2024-08-28 DIAGNOSIS — L599 Disorder of the skin and subcutaneous tissue related to radiation, unspecified: Secondary | ICD-10-CM

## 2024-08-28 DIAGNOSIS — C50411 Malignant neoplasm of upper-outer quadrant of right female breast: Secondary | ICD-10-CM | POA: Diagnosis not present

## 2024-08-28 DIAGNOSIS — I890 Lymphedema, not elsewhere classified: Secondary | ICD-10-CM

## 2024-08-28 NOTE — Telephone Encounter (Signed)
 No problem with taking antibiotic preop.

## 2024-08-28 NOTE — Therapy (Addendum)
 OUTPATIENT PHYSICAL THERAPY  UPPER EXTREMITY ONCOLOGY TREATMENT  Patient Name: Marie Tanner MRN: [id number] DOB:1956-04-26, 68 y.o., female Today's Date: 08/28/2024  END OF SESSION:  PT End of Session - 08/28/24 1101     Visit Number 5    Number of Visits 9    Date for Recertification  09/11/24    Authorization Type none needed    PT Start Time 1013    PT Stop Time 1100    PT Time Calculation (min) 47 min    Activity Tolerance Patient tolerated treatment well    Behavior During Therapy [facility] for tasks assessed/performed            Past Medical History:  Diagnosis Date   Breast cancer (HCC)    Cancer (HCC)    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   GERD (gastroesophageal reflux disease)    HCAP (healthcare-associated pneumonia) 11/22/2014   Heart murmur    High cholesterol    History of blood transfusion 09/2014   related to MI   History of gout    Hypertension    Liver laceration 10/19/2014   Palpitations 12/12/2020   Pulmonary edema 10/19/2014   Renal disorder    Renal disorder    Right hip pain 04/03/2021   Sleep apnea    suppose to wear mask but I don't (11/22/2014)   STEMI (ST elevation myocardial infarction) (HCC) 10/19/2014   Type II diabetes mellitus (HCC)    Urticaria    Past Surgical History:  Procedure Laterality Date   ABDOMINAL HYSTERECTOMY  ~ 2000   APPLICATION OF WOUND VAC  10/2014   over my naval (11/22/2014)   BREAST LUMPECTOMY Right 2017   BREAST LUMPECTOMY WITH AXILLARY LYMPH NODE BIOPSY     BREAST SURGERY     CESAREAN SECTION  1980; 1982   CORONARY ANGIOPLASTY WITH STENT PLACEMENT  09/2014   multiple/notes 11/22/2014   EXTRACORPOREAL CIRCULATION     Femerol artery repair Right 09/2014   pt insists it was a right femoral artery repair on 11/22/2014    ICD IMPLANT N/A 07/27/2024   Procedure: ICD IMPLANT;  Surgeon: Waddell Danelle ORN, MD;  Location: [facility] INVASIVE CV LAB;  Service: Cardiovascular;  Laterality: N/A;   LAPAROSCOPIC ABDOMINAL EXPLORATION     LEFT HEART CATH AND CORONARY ANGIOGRAPHY N/A 12/07/2017   Procedure: LEFT HEART CATH AND CORONARY ANGIOGRAPHY;  Surgeon: Anner Alm ORN, MD;  Location: [facility] INVASIVE CV LAB;  Service: Cardiovascular;  Laterality: N/A;   LEFT VENTRICULAR ASSIST DEVICE     pt is not aware of this hx on 11/22/2014   Liver laceration repair  09/2014   related to CPR/notes 11/22/2014   RIGHT/LEFT HEART CATH AND CORONARY ANGIOGRAPHY N/A 04/21/2021   Procedure: RIGHT/LEFT HEART CATH AND CORONARY ANGIOGRAPHY;  Surgeon: Mady Bruckner, MD;  Location: [facility] INVASIVE CV LAB;  Service: Cardiovascular;  Laterality: N/A;   TEE WITHOUT CARDIOVERSION N/A 03/13/2021   Procedure: TRANSESOPHAGEAL ECHOCARDIOGRAM (TEE);  Surgeon: Loni Soyla LABOR, MD;  Location: 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   Stented coronary artery 03/17/2023   Coronary artery disease involving  native coronary artery of native heart without angina pectoris 10/13/2022   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) 04/06/2022   Type 2 diabetes mellitus with hyperglycemia, without long-term current use of insulin  (HCC) 04/06/2022   VT (ventricular tachycardia) (HCC) 04/06/2022   Class 2 severe obesity due to excess calories with serious comorbidity and body mass index (BMI) of 36.0 to 36.9 in adult 04/06/2022   Right hip pain 04/03/2021   Palpitations 12/12/2020   Allergic urticaria 10/31/2019   Dermographia 10/31/2019   Other allergic rhinitis 10/31/2019   Current use of beta blocker 10/31/2019   Allergy to insect bites 10/31/2019   Hx of colonic polyp  01/03/2018   Angina, class III 12/07/2017   Abnormal nuclear stress test 12/07/2017   Type II diabetes mellitus (HCC)    Sleep apnea    Renal disorder    History of gout    Pure hypercholesterolemia    Heart murmur    GERD (gastroesophageal reflux disease)    CAD S/P percutaneous coronary angioplasty    Cancer [facility])    Malignant neoplasm of upper-outer quadrant of right breast in female, estrogen receptor positive (HCC)    Hyperlipidemia LDL goal <70 11/16/2016   History of breast cancer 01/24/2016   Abnormal finding on mammography 01/17/2016   Chest pain 06/06/2015   Diarrhea 06/06/2015   Essential hypertension 06/06/2015   Depression    Encounter for therapeutic drug monitoring 02/19/2015   Mitral regurgitation 01/30/2015   Ischemic heart disease due to coronary artery obstruction 01/30/2015   Chronic combined systolic and diastolic heart failure (HCC) 01/30/2015   Dyspnea 11/24/2014   Cardiomyopathy (HCC) 11/24/2014   DVT (deep venous thrombosis) (HCC) 11/24/2014   DM type 2 (diabetes mellitus, type 2) (HCC) 11/24/2014   HCAP (healthcare-associated pneumonia) 11/22/2014   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) 11/04/2014   Controlled type 2 diabetes mellitus without complication (HCC) 10/31/2014   High sodium levels 10/29/2014   Elevated WBC count 10/29/2014   Acute confusion due to medical condition 10/27/2014   Insomnia due to medical condition 10/27/2014   Acute respiratory failure (HCC) 10/24/2014   Injury of kidney 10/20/2014   Laceration of liver 10/20/2014   STEMI (ST elevation myocardial infarction) (HCC) 10/19/2014   Pulmonary edema 10/19/2014   Liver laceration 10/19/2014   History of blood transfusion 09/30/2014   Diabetes mellitus (HCC) 04/19/2013    PCP: Rojelio Daring, NP  REFERRING PROVIDER: Mackey Chad, MD  REFERRING DIAG: I89.0  THERAPY DIAG:  Malignant neoplasm of upper-outer quadrant of right breast in female, estrogen receptor  positive (HCC)  Aftercare following surgery for neoplasm  Disorder of the skin and subcutaneous tissue related to radiation, unspecified  Lymphedema, not elsewhere classified  ONSET DATE: 2017  Rationale for Evaluation and Treatment: Rehabilitation  SUBJECTIVE:  SUBJECTIVE STATEMENT:  Patient reports she felt good after her last visit. She did had mild soreness after the last visit but it was not as bad as it was after the first visit. The patient is still waiting on the compression bra to arrive. Patient reports that she sometimes has pain when completing the standing wall slides into flexion & abduction with her HEP.   EVAL I have limited ROM.  It is having some swelling and pain come and go.  It was doing well and then got worse over time.  I need a new sleeve and glove.  I learned the massage but don't remember it .  The breast also swells and has pain off and on. No compression bras.    PERTINENT HISTORY: Rt ER+ breast cancer treated in 2017 with lumpectomy with unknown lymph node status , chemotherapy, radiation. MI 2015 with stent and defibrillator   PAIN:  Are you having pain? Yes   NPRS scale: 3/10 Pain location: Rt upper arm /Shoulder and mid deltoid Pain orientation: Right  PAIN TYPE: aching Pain description: intermittent  Aggravating factors: lifting and pulling  Relieving factors: rubbing and tylenol    PRECAUTIONS: None, can't lift more than 10# for 3 more weeks   RED FLAGS: None   WEIGHT BEARING RESTRICTIONS: No  FALLS:  Has patient fallen in last 6 months? 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  - needs help with the lifting   LEISURE: nothing now.    HAND DOMINANCE: right   PRIOR LEVEL  OF FUNCTION: Independent  PATIENT GOALS: being able to use the Rt arm more    OBJECTIVE: Note: Objective measures were completed at Evaluation unless otherwise noted.  COGNITION: Overall cognitive status: Within functional limits for tasks assessed   PALPATION: No pitting edema in the arm, no pitting in the breast,  fibrosis in the the lateral and inferior breast, tightness and decreased skin mobility under the breast and at the axilla where radiation desquamation occurred.    OBSERVATIONS / OTHER ASSESSMENTS: Rt breast nipple pulled inferiorly due to fibrosis and tightness    SENSATION: Some mild neuropathy hand and feets   POSTURE: rounded shoulders    UPPER EXTREMITY AROM/PROM:  A/PROM RIGHT   eval   Shoulder extension 47 pn  Shoulder flexion 105 pn   Shoulder abduction 80 tight   Shoulder internal rotation To L3   Shoulder external rotation     (Blank rows = not tested)  A/PROM LEFT   eval  Shoulder extension 60  Shoulder flexion 143  Shoulder abduction 150  Shoulder internal rotation T9  Shoulder external rotation 85    (Blank rows = not tested)  UPPER EXTREMITY STRENGTH:   LYMPHEDEMA ASSESSMENTS:   LANDMARK RIGHT  eval  At axilla    15 cm proximal to olecranon process 41  10 cm proximal to olecranon process 40.8  Olecranon process 32.2  15 cm proximal to ulnar styloid process 26.5  10 cm proximal to ulnar styloid process 23.1  Just proximal to ulnar styloid process 20  Across hand at thumb web space 23  At base of 2nd digit 7.8  (Blank rows = not tested)  LANDMARK LEFT  eval  At axilla    15 cm proximal to olecranon process 39.5  10 cm proximal to olecranon process 36.3  Olecranon process 31  15 cm proximal to ulnar styloid process 25.7  10 cm proximal to ulnar styloid process 22.4  Just proximal to ulnar styloid process 19.7  Across hand at thumb web space 22  At base of 2nd digit 8.0  (Blank rows = not tested)   QUICK DASH SURVEY:  61%  BREAST COMPLAINTS QUESTIONNAIRE    08/14/24 Pain:   4 Heaviness:  7 Swollen feeling: 8 Tense Skin:  6 Redness:  3 Bra Print:  6 Size of Pores:  4 Hard feeling:   8 Total:   46 /80 A Score over 9 indicates lymphedema issues in the breast                                                                                                                           TREATMENT DATE:  Pt permission and consent throughout each step of examination and treatment with modification and draping if requested when working on sensitive areas  08/28/2024 Pulleys into flexion and abduction x each with VC's prn to depress scapula and monitoring UT Supine dowel AAROM flex & abd x 10 each  Supine serratus punch with VC for proper form x 10  Supine chest press with arm on towel roll with VC to keep shoulders down throughout the exercise  Supine scaption x 5 with VC's for scapular depression Standing rows  x 10 with YTB - tactile & VC to keep shoulders down and squeeze scapula together Standing extension x 10 with YTB - VC as needed to keep shoulders down Patient education on adjustments for standing flexion & abduction for HEP - advised patient to try completing this supine or seated on a table if standing is painful Manual Therapy STM to right UT, pectorals and mid delt with cocoa butter to decrease tissue tension PROM to pts right shoulder with VC's to relax; flexion, scaption, abd, IR and ER. Long axis distraction for relaxation  08/23/2024 Pulleys into flexion and abduction x each with VC's prn to depress scapula and monitoring UT Supine serratus punch with PT guidance x 10 Supine chest press with arm on towel roll with PT guidance Supine scaption x 5 with VC's for scapular depression Standing theraband for scapular retraction and shoulder ext Bilateral UE x 10 ea with yellow;added to HEP STM to right UT, pectorals and mid delt with cocoa butter to decrease tissue tension PROM to pts right  shoulder with VC's to relax;flexion, scaption, abd, IR and ER. Long axis distraction for relaxation  08/21/24 Signed pt up for sleeve measurement 9/29@11am   Pulleys into flexion and abduction x each with initial cueing limited by pain into abduction so more scaption was performed Scapular retractions x 10 with cueing for motion.   Wall ball flexion x 10 Attempted ranger but this was too difficult and switched to supine Supine scaption x 10  STM to right UT, pectorals and mid delt with cocoa butter to decrease tissue tension PROM to pts right shoulder with VC's to relax;flexion, scaption, abd, IR and ER  PATIENT EDUCATION:  Access Code: [email or web address] URL: [email or web address] Date: 08/23/2024 Prepared by: Grayce Sheldon  Exercises - Scapular Retraction with Resistance  - 1 x daily - 7 x weekly - 1 sets - 10 reps - Shoulder Extension with Resistance  - 1 x daily - 7 x weekly - 1 sets - 10 reps  Education details: per today's note Person educated: Patient Education method: Medical illustrator Education comprehension: verbalized understanding  HOME EXERCISE PROGRAM: Get compression bra, swell spot   ASSESSMENT:  CLINICAL IMPRESSION: Pt tolerates manual therapy with patient reporting decreased muscle tightness and improved shoulder flexion ROM following. Patient requires verbal cueing throughout the exercises for proper techniques and to decrease compensation by shrugging the shoulders. Patient education is provided for the patient to attempt completing the AAROM flexion and abduction supine or seated at a table if the patient continues to have pain when performing this at the wall. Patient will benefit from skilled physical therapy to address the impairments listed below. Pt was also measured today for a custom compression sleeve for the Rt UE due to her stage 2 lymphedema without much fluctuation.  She will require a flat knit fabric due to stage and  presentation and shape of the arm.  An elbow flexure zone and 5cm silicone border will be needed for comfort.  A night garment will also be necessary due to fibrosis.     OBJECTIVE IMPAIRMENTS: decreased activity tolerance, decreased knowledge of condition, decreased knowledge of use of DME, decreased ROM, decreased strength, and impaired UE functional use.   ACTIVITY LIMITATIONS: carrying and lifting  PARTICIPATION LIMITATIONS: community activity  PERSONAL FACTORS: Time since onset of injury/illness/exacerbation and 1-2 comorbidities: SLNB, past radiation are also affecting patient's functional outcome.   REHAB POTENTIAL: Excellent  CLINICAL DECISION MAKING: Stable/uncomplicated  EVALUATION COMPLEXITY: Low  GOALS: Goals reviewed with patient? Yes   STGs = LONG TERM GOALS: Target date: 09/11/24  Pt will obtain compression bra and swell spot for breast swelling management  Baseline:  Goal status: INITIAL  2.  Pt will obtain compression sleeve for arm swelling management Baseline:  Goal status: INITIAL  3.  Pt will improve her Rt arm AROM to at least 140 deg of flexion and abduction to improve functional reach  Baseline:  Goal status: INITIAL  4.  Pt will be ind with self MLD for the Rt breast and UE Baseline:  Goal status: INITIAL   PLAN:  PT FREQUENCY: 2x/week  PT DURATION: 4 weeks  PLANNED INTERVENTIONS: 97110-Therapeutic exercises, 97530- Therapeutic activity, 97112- Neuromuscular re-education, 97535- Self Care, [id number]- Manual therapy, Patient/Family education, Joint mobilization, Therapeutic exercises, Therapeutic activity, Neuromuscular re-education, and Self Care  PLAN FOR NEXT SESSION: Rt shoulder AAROM (pulleys, etc), PROM, STM Rt upper quadrant, eventually order sleeve and teach self MLD.  9/29: Pt will be measured 9/29   Randall Pack, SPT 08/28/2024, 11:02 AM  I agree with the following treatment note after reviewing documentation. This session was performed  under the supervision of a licensed clinician.  Saddie Raw, PT 08/28/24, 12:52 PM

## 2024-08-30 ENCOUNTER — Telehealth: Payer: Self-pay | Admitting: *Deleted

## 2024-08-30 ENCOUNTER — Ambulatory Visit: Attending: Hematology and Oncology

## 2024-08-30 DIAGNOSIS — C50411 Malignant neoplasm of upper-outer quadrant of right female breast: Secondary | ICD-10-CM | POA: Diagnosis present

## 2024-08-30 DIAGNOSIS — M25611 Stiffness of right shoulder, not elsewhere classified: Secondary | ICD-10-CM | POA: Diagnosis present

## 2024-08-30 DIAGNOSIS — L599 Disorder of the skin and subcutaneous tissue related to radiation, unspecified: Secondary | ICD-10-CM | POA: Insufficient documentation

## 2024-08-30 DIAGNOSIS — Z483 Aftercare following surgery for neoplasm: Secondary | ICD-10-CM | POA: Diagnosis present

## 2024-08-30 DIAGNOSIS — I890 Lymphedema, not elsewhere classified: Secondary | ICD-10-CM | POA: Insufficient documentation

## 2024-08-30 DIAGNOSIS — Z170 Estrogen receptor positive status [ER+]: Secondary | ICD-10-CM | POA: Diagnosis present

## 2024-08-30 NOTE — Telephone Encounter (Signed)
 Received pt positive (<0.05%) Guardant Reveal results.  MD appt scheduled to further discuss. Pt educated and verbalized understanding.

## 2024-08-30 NOTE — Therapy (Signed)
 OUTPATIENT PHYSICAL THERAPY  UPPER EXTREMITY ONCOLOGY TREATMENT  Patient Name: Marie Tanner MRN: [id number] DOB:07-25-1956, 68 y.o., female Today's Date: 08/30/2024  END OF SESSION:  PT End of Session - 08/30/24 1009     Visit Number 6    Number of Visits 9    Date for Recertification  09/11/24    PT Start Time 1008   pt late   PT Stop Time 1054    PT Time Calculation (min) 46 min    Activity Tolerance Patient tolerated treatment well    Behavior During Therapy [facility] for tasks assessed/performed            Past Medical History:  Diagnosis Date   Breast cancer (HCC)    Cancer (HCC)    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   GERD (gastroesophageal reflux disease)    HCAP (healthcare-associated pneumonia) 11/22/2014   Heart murmur    High cholesterol    History of blood transfusion 09/2014   related to MI   History of gout    Hypertension    Liver laceration 10/19/2014   Palpitations 12/12/2020   Pulmonary edema 10/19/2014   Renal disorder    Renal disorder    Right hip pain 04/03/2021   Sleep apnea    suppose to wear mask but I don't (11/22/2014)   STEMI (ST elevation myocardial infarction) (HCC) 10/19/2014   Type II diabetes mellitus (HCC)    Urticaria    Past Surgical History:  Procedure Laterality Date   ABDOMINAL HYSTERECTOMY  ~ 2000   APPLICATION OF WOUND VAC  10/2014   over my naval (11/22/2014)   BREAST LUMPECTOMY Right 2017   BREAST LUMPECTOMY WITH AXILLARY LYMPH NODE BIOPSY     BREAST SURGERY     CESAREAN SECTION  1980; 1982   CORONARY ANGIOPLASTY WITH STENT PLACEMENT  09/2014   multiple/notes 11/22/2014   EXTRACORPOREAL CIRCULATION     Femerol artery repair Right 09/2014   pt insists it was a right femoral artery repair on 11/22/2014   ICD IMPLANT N/A 07/27/2024    Procedure: ICD IMPLANT;  Surgeon: Waddell Danelle ORN, MD;  Location: [facility] INVASIVE CV LAB;  Service: Cardiovascular;  Laterality: N/A;   LAPAROSCOPIC ABDOMINAL EXPLORATION     LEFT HEART CATH AND CORONARY ANGIOGRAPHY N/A 12/07/2017   Procedure: LEFT HEART CATH AND CORONARY ANGIOGRAPHY;  Surgeon: Anner Alm ORN, MD;  Location: [facility] INVASIVE CV LAB;  Service: Cardiovascular;  Laterality: N/A;   LEFT VENTRICULAR ASSIST DEVICE     pt is not aware of this hx on 11/22/2014   Liver laceration repair  09/2014   related to CPR/notes 11/22/2014   RIGHT/LEFT HEART CATH AND CORONARY ANGIOGRAPHY N/A 04/21/2021   Procedure: RIGHT/LEFT HEART CATH AND CORONARY ANGIOGRAPHY;  Surgeon: Mady Bruckner, MD;  Location: [facility] INVASIVE CV LAB;  Service: Cardiovascular;  Laterality: N/A;   TEE WITHOUT CARDIOVERSION N/A 03/13/2021   Procedure: TRANSESOPHAGEAL ECHOCARDIOGRAM (TEE);  Surgeon: Loni Soyla LABOR, MD;  Location: [facility] ENDOSCOPY;  Service: Cardiology;  Laterality: N/A;   TRACHEOSTOMY  09/2014   closed on it's own when they took it out   TUBAL LIGATION  1982   Patient Active Problem List   Diagnosis Date Noted   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   Stented coronary artery 03/17/2023   Coronary artery disease involving native coronary artery of  native heart without angina pectoris 10/13/2022   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) 04/06/2022   Type 2 diabetes mellitus with hyperglycemia, without long-term current use of insulin  (HCC) 04/06/2022   VT (ventricular tachycardia) (HCC) 04/06/2022   Class 2 severe obesity due to excess calories with serious comorbidity and body mass index (BMI) of 36.0 to 36.9 in adult 04/06/2022   Right hip pain 04/03/2021   Palpitations 12/12/2020   Allergic urticaria 10/31/2019   Dermographia 10/31/2019   Other allergic rhinitis 10/31/2019   Current use of beta blocker 10/31/2019   Allergy to insect bites 10/31/2019   Hx of colonic polyp 01/03/2018   Angina, class III  12/07/2017   Abnormal nuclear stress test 12/07/2017   Type II diabetes mellitus (HCC)    Sleep apnea    Renal disorder    History of gout    Pure hypercholesterolemia    Heart murmur    GERD (gastroesophageal reflux disease)    CAD S/P percutaneous coronary angioplasty    Cancer [facility])    Malignant neoplasm of upper-outer quadrant of right breast in female, estrogen receptor positive (HCC)    Hyperlipidemia LDL goal <70 11/16/2016   History of breast cancer 01/24/2016   Abnormal finding on mammography 01/17/2016   Chest pain 06/06/2015   Diarrhea 06/06/2015   Essential hypertension 06/06/2015   Depression    Encounter for therapeutic drug monitoring 02/19/2015   Mitral regurgitation 01/30/2015   Ischemic heart disease due to coronary artery obstruction 01/30/2015   Chronic combined systolic and diastolic heart failure (HCC) 01/30/2015   Dyspnea 11/24/2014   Cardiomyopathy (HCC) 11/24/2014   DVT (deep venous thrombosis) (HCC) 11/24/2014   DM type 2 (diabetes mellitus, type 2) (HCC) 11/24/2014   HCAP (healthcare-associated pneumonia) 11/22/2014   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) 11/04/2014   Controlled type 2 diabetes mellitus without complication (HCC) 10/31/2014   High sodium levels 10/29/2014   Elevated WBC count 10/29/2014   Acute confusion due to medical condition 10/27/2014   Insomnia due to medical condition 10/27/2014   Acute respiratory failure (HCC) 10/24/2014   Injury of kidney 10/20/2014   Laceration of liver 10/20/2014   STEMI (ST elevation myocardial infarction) (HCC) 10/19/2014   Pulmonary edema 10/19/2014   Liver laceration 10/19/2014   History of blood transfusion 09/30/2014   Diabetes mellitus (HCC) 04/19/2013    PCP: Rojelio Daring, NP  REFERRING PROVIDER: Mackey Chad, MD  REFERRING DIAG: I89.0  THERAPY DIAG:  Malignant neoplasm of upper-outer quadrant of right breast in female, estrogen receptor positive (HCC)  Aftercare  following surgery for neoplasm  Disorder of the skin and subcutaneous tissue related to radiation, unspecified  Lymphedema, not elsewhere classified  Stiffness of right shoulder, not elsewhere classified  ONSET DATE: 2017  Rationale for Evaluation and Treatment: Rehabilitation  SUBJECTIVE:  SUBJECTIVE STATEMENT:  My shoulder is doing some better. I still have trouble reaching into cabinets but I feel like I can reach a little further. It is still sore in my outer upper arm. I am supposed to pick up my compression bra today.   EVAL I have limited ROM.  It is having some swelling and pain come and go.  It was doing well and then got worse over time.  I need a new sleeve and glove.  I learned the massage but don't remember it .  The breast also swells and has pain off and on. No compression bras.    PERTINENT HISTORY: Rt ER+ breast cancer treated in 2017 with lumpectomy with unknown lymph node status , chemotherapy, radiation. MI 2015 with stent and defibrillator   PAIN:  Are you having pain? Yes   NPRS scale: 2/10 Pain location: Rt upper arm /Shoulder and mid deltoid Pain orientation: Right  PAIN TYPE: aching Pain description: intermittent  Aggravating factors: lifting and pulling  Relieving factors: rubbing and tylenol    PRECAUTIONS: None, can't lift more than 10# for 3 more weeks   RED FLAGS: None   WEIGHT BEARING RESTRICTIONS: No  FALLS:  Has patient fallen in last 6 months? 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  - needs help with the lifting   LEISURE: nothing now.    HAND DOMINANCE: right   PRIOR LEVEL OF FUNCTION: Independent  PATIENT GOALS: being able to use the Rt arm more     OBJECTIVE: Note: Objective measures were completed at Evaluation unless otherwise noted.  COGNITION: Overall cognitive status: Within functional limits for tasks assessed   PALPATION: No pitting edema in the arm, no pitting in the breast,  fibrosis in the the lateral and inferior breast, tightness and decreased skin mobility under the breast and at the axilla where radiation desquamation occurred.    OBSERVATIONS / OTHER ASSESSMENTS: Rt breast nipple pulled inferiorly due to fibrosis and tightness    SENSATION: Some mild neuropathy hand and feets   POSTURE: rounded shoulders    UPPER EXTREMITY AROM/PROM:  A/PROM RIGHT   eval   Shoulder extension 47 pn  Shoulder flexion 105 pn   Shoulder abduction 80 tight   Shoulder internal rotation To L3   Shoulder external rotation     (Blank rows = not tested)  A/PROM LEFT   eval  Shoulder extension 60  Shoulder flexion 143  Shoulder abduction 150  Shoulder internal rotation T9  Shoulder external rotation 85    (Blank rows = not tested)  UPPER EXTREMITY STRENGTH:   LYMPHEDEMA ASSESSMENTS:   LANDMARK RIGHT  eval  At axilla    15 cm proximal to olecranon process 41  10 cm proximal to olecranon process 40.8  Olecranon process 32.2  15 cm proximal to ulnar styloid process 26.5  10 cm proximal to ulnar styloid process 23.1  Just proximal to ulnar styloid process 20  Across hand at thumb web space 23  At base of 2nd digit 7.8  (Blank rows = not tested)  LANDMARK LEFT  eval  At axilla    15 cm proximal to olecranon process 39.5  10 cm proximal to olecranon process 36.3  Olecranon process 31  15 cm proximal to ulnar styloid process 25.7  10 cm proximal to ulnar styloid process 22.4  Just proximal to ulnar styloid process 19.7  Across hand at  thumb web space 22  At base of 2nd digit 8.0  (Blank rows = not tested)   QUICK DASH SURVEY: 61%  BREAST COMPLAINTS  QUESTIONNAIRE    08/14/24 Pain:   4 Heaviness:  7 Swollen feeling: 8 Tense Skin:  6 Redness:  3 Bra Print:  6 Size of Pores:  4 Hard feeling:   8 Total:   46 /80 A Score over 9 indicates lymphedema issues in the breast                                                                                                                           TREATMENT DATE:  Pt permission and consent throughout each step of examination and treatment with modification and draping if requested when working on sensitive areas  08/30/2024 Pulleys into flexion and scaption x each with VC's prn to depress scapula and monitoring UT Supine dowel AAROM flex x 10   Supine serratus punch 1# with VC and occasional TC for proper form x 10  Supine chest press with arm on towel roll with VC to keep shoulders down throughout the exercise  Supine scaption x 5 with VC's for scapular depression Standing rows  x 12 with YTB - tactile & VC to keep shoulders down and squeeze scapula together Standing extension x 12 with YTB - VC as needed to keep shoulders down Manual Therapy STM to right UT, pectorals and mid delt with cocoa butter to decrease tissue tension PROM to pts right shoulder with VC's to relax; flexion, scaption, abd, IR and ER. Long axis distraction for relaxation  08/28/2024 Pulleys into flexion and abduction x each with VC's prn to depress scapula and monitoring UT Supine dowel AAROM flex & abd x 10 each  Supine serratus punch with VC for proper form x 10  Supine chest press with arm on towel roll with VC to keep shoulders down throughout the exercise  Supine scaption x 5 with VC's for scapular depression Standing rows  x 10 with YTB - tactile & VC to keep shoulders down and squeeze scapula together Standing extension x 10 with YTB - VC as needed to keep shoulders down Patient education on adjustments for standing flexion & abduction for HEP - advised patient to try completing this supine or seated on  a table if standing is painful Manual Therapy STM to right UT, pectorals and mid delt with cocoa butter to decrease tissue tension PROM to pts right shoulder with VC's to relax; flexion, scaption, abd, IR and ER. Long axis distraction for relaxation  08/23/2024 Pulleys into flexion and abduction x each with VC's prn to depress scapula and monitoring UT Supine serratus punch with PT guidance x 10 Supine chest press with arm on towel roll with PT guidance Supine scaption x 5 with VC's for scapular depression Standing theraband for scapular retraction and shoulder ext Bilateral UE x 10 ea with yellow;added to HEP STM to right UT, pectorals and  mid delt with cocoa butter to decrease tissue tension PROM to pts right shoulder with VC's to relax;flexion, scaption, abd, IR and ER. Long axis distraction for relaxation  08/21/24 Signed pt up for sleeve measurement 9/29@11am   Pulleys into flexion and abduction x each with initial cueing limited by pain into abduction so more scaption was performed Scapular retractions x 10 with cueing for motion.   Wall ball flexion x 10 Attempted ranger but this was too difficult and switched to supine Supine scaption x 10  STM to right UT, pectorals and mid delt with cocoa butter to decrease tissue tension PROM to pts right shoulder with VC's to relax;flexion, scaption, abd, IR and ER   PATIENT EDUCATION:  Access Code: [email or web address] URL: [email or web address] Date: 08/23/2024 Prepared by: Grayce Sheldon  Exercises - Scapular Retraction with Resistance  - 1 x daily - 7 x weekly - 1 sets - 10 reps - Shoulder Extension with Resistance  - 1 x daily - 7 x weekly - 1 sets - 10 reps  Education details: per today's note Person educated: Patient Education method: Medical illustrator Education comprehension: verbalized understanding  HOME EXERCISE PROGRAM: Get compression bra, swell spot   ASSESSMENT:  CLINICAL IMPRESSION: Pt will  pick up her compression bra today. Good tolerance for activities in clinic today and visibly improved shoulder Flex and scaption. Continues with some soreness at mid deltoid and requires VC's for UT compensation.  OBJECTIVE IMPAIRMENTS: decreased activity tolerance, decreased knowledge of condition, decreased knowledge of use of DME, decreased ROM, decreased strength, and impaired UE functional use.   ACTIVITY LIMITATIONS: carrying and lifting  PARTICIPATION LIMITATIONS: community activity  PERSONAL FACTORS: Time since onset of injury/illness/exacerbation and 1-2 comorbidities: SLNB, past radiation are also affecting patient's functional outcome.   REHAB POTENTIAL: Excellent  CLINICAL DECISION MAKING: Stable/uncomplicated  EVALUATION COMPLEXITY: Low  GOALS: Goals reviewed with patient? Yes   STGs = LONG TERM GOALS: Target date: 09/11/24  Pt will obtain compression bra and swell spot for breast swelling management  Baseline:  Goal status: INITIAL  2.  Pt will obtain compression sleeve for arm swelling management Baseline:  Goal status: INITIAL  3.  Pt will improve her Rt arm AROM to at least 140 deg of flexion and abduction to improve functional reach  Baseline:  Goal status: INITIAL  4.  Pt will be ind with self MLD for the Rt breast and UE Baseline:  Goal status: INITIAL   PLAN:  PT FREQUENCY: 2x/week  PT DURATION: 4 weeks  PLANNED INTERVENTIONS: 97110-Therapeutic exercises, 97530- Therapeutic activity, 97112- Neuromuscular re-education, 97535- Self Care, [id number]- Manual therapy, Patient/Family education, Joint mobilization, Therapeutic exercises, Therapeutic activity, Neuromuscular re-education, and Self Care  PLAN FOR NEXT SESSION: Rt shoulder AAROM (pulleys, etc), PROM, STM Rt upper quadrant, eventually order sleeve and teach self MLD.  9/29: Pt will be measured 9/29  Order Emailed to [employer] after no coverage at [facility] - KT  Natassja Ollis,  PT 08/30/24 10:57 AM

## 2024-09-04 ENCOUNTER — Encounter

## 2024-09-04 ENCOUNTER — Inpatient Hospital Stay: Attending: Hematology and Oncology | Admitting: Hematology and Oncology

## 2024-09-04 VITALS — BP 118/64 | HR 93 | Temp 97.7°F | Resp 18 | Ht 62.0 in | Wt 200.2 lb

## 2024-09-04 DIAGNOSIS — Z79899 Other long term (current) drug therapy: Secondary | ICD-10-CM | POA: Diagnosis not present

## 2024-09-04 DIAGNOSIS — Z1722 Progesterone receptor negative status: Secondary | ICD-10-CM | POA: Diagnosis not present

## 2024-09-04 DIAGNOSIS — Z170 Estrogen receptor positive status [ER+]: Secondary | ICD-10-CM | POA: Insufficient documentation

## 2024-09-04 DIAGNOSIS — Z7902 Long term (current) use of antithrombotics/antiplatelets: Secondary | ICD-10-CM | POA: Diagnosis not present

## 2024-09-04 DIAGNOSIS — Z1732 Human epidermal growth factor receptor 2 negative status: Secondary | ICD-10-CM | POA: Diagnosis not present

## 2024-09-04 DIAGNOSIS — Z885 Allergy status to narcotic agent status: Secondary | ICD-10-CM | POA: Diagnosis not present

## 2024-09-04 DIAGNOSIS — Z7982 Long term (current) use of aspirin: Secondary | ICD-10-CM | POA: Diagnosis not present

## 2024-09-04 DIAGNOSIS — C50411 Malignant neoplasm of upper-outer quadrant of right female breast: Secondary | ICD-10-CM | POA: Diagnosis not present

## 2024-09-04 NOTE — Assessment & Plan Note (Signed)
 2017: [facility]: Right lumpectomy: T1c N1a M0 stage IIa IDC ER 100% PR 0% HER2 negative Status post 4 cycles of Taxotere and Cytoxan Followed by radiation Antiestrogen therapy with exemestane started 2017 completed 2022   Current treatment:  Mammogram 12/13/2023: Benign breast density category B Breast exam 08/10/2024: Benign  Guardant reveal 08/29/2024: Positive for circulating tumor DNA less than 0.05% Recommendation: CT chest abdomen pelvis Bone scan Breast MRI  Follow-up with me after the scans are completed.  We discussed with the patient that there could be potential false positives with this testing. Return to clinic in 1 year for follow-up

## 2024-09-04 NOTE — Progress Notes (Signed)
 Patient Care Team: Delores Rojelio Caldron, NP as PCP - General (Nurse Practitioner) Raford Riggs, MD as PCP - Cardiology (Cardiology) Jama No (Inactive) (Cardiology)  DIAGNOSIS:  Encounter Diagnosis  Name Primary?   Malignant neoplasm of upper-outer quadrant of right breast in female, estrogen receptor positive (HCC) Yes    SUMMARY OF ONCOLOGIC HISTORY: Oncology History  Malignant neoplasm of upper-outer quadrant of right breast in female, estrogen receptor positive (HCC)  01/2016 Initial Diagnosis   Treated at [facility]: Lumpectomy: Stage IIa T1c N1a M0 IDC ER 100%, PR 0%, HER2 negative   2017 - 2017 Chemotherapy   4 cycles of Taxotere and Cytoxan   2017 - 2017 Radiation Therapy   Adjuvant radiation therapy   2017 -  Anti-estrogen oral therapy   Letrozole switched to exemestane     CHIEF COMPLIANT: Follow-up to discuss guardant reveal testing showing positive result  HISTORY OF PRESENT ILLNESS:  History of Present Illness Marie Tanner is a 68 year old female with a history of breast cancer who presents for evaluation of a positive blood test indicating possible cancer cell DNA.  She recently underwent a blood test that detected a small fraction of cancer cell DNA in her bloodstream. This is the first time this advanced test has been conducted on her. She has a history of estrogen receptor-positive breast cancer and previously completed treatment with anastrozole. Her last mammogram was in January of this year. She has no current pain.     ALLERGIES:  is allergic to dilaudid [hydromorphone hcl], epinephrine , lactose intolerance (gi), tilactase, jardiance  [empagliflozin ], and shellfish allergy.  MEDICATIONS:  Current Outpatient Medications  Medication Sig Dispense Refill   acetaminophen  (TYLENOL ) 500 MG tablet Take 1,000 mg by mouth every 6 (six) hours as needed for moderate pain.     aspirin  EC 81 MG tablet Take 1 tablet (81 mg total) by mouth daily.      blood glucose meter kit and supplies KIT by Does not apply route daily as needed. Dispense based on patient and insurance preference. Use up to four times daily as directed. (FOR ICD-9 250.00, 250.01).     BRILINTA  60 MG TABS tablet TAKE 1 TABLET BY MOUTH 2 TIMES DAILY. 180 tablet 3   calcium -vitamin D (OSCAL WITH D) 500-5 MG-MCG tablet Take 1 tablet by mouth 2 (two) times daily.     Dulaglutide  (TRULICITY ) 4.5 MG/0.5ML SOPN Inject 4.5 mg as directed once a week. 2 mL 11   Evolocumab  (REPATHA  SURECLICK) 140 MG/ML SOAJ INJECT 140 MG INTO THE SKIN EVERY 14 (FOURTEEN) DAYS. 6 mL 3   fluticasone  (FLONASE ) 50 MCG/ACT nasal spray Place 1 spray into both nostrils as needed for allergies.     furosemide  (LASIX ) 40 MG tablet TAKE 1 TABLET ONCE PER WEEK WITH ADDITIONAL TABLET AS NEEDED FOR SHORTNESS OF BREATH OR WEIGHT GAIN OF 2 POUNDS IN 24 HOURS OR 5 POUNDS IN 1 WEEK 30 tablet 3   gabapentin (NEURONTIN) 300 MG capsule Take 300 mg by mouth at bedtime.     glucose blood test strip Use as instructed. E11.5. To check blood glucose daily. 100 each 12   ketoconazole  (NIZORAL ) 2 % cream Apply to both feet and between toes once daily. 60 g 1   magnesium  oxide (MAG-OX) 400 MG tablet TAKE 1 TABLET BY MOUTH EVERY DAY 90 tablet 0   metoprolol  succinate (TOPROL -XL) 25 MG 24 hr tablet Take 1 tablet (25 mg total) by mouth daily. Take with or immediately following a  meal. 90 tablet 3   mycophenolate  (CELLCEPT ) 500 MG tablet TAKE 3 TABLETS (1500 MG) BY MOUTH TWICE A DAY 540 tablet 1   nitroGLYCERIN  (NITROSTAT ) 0.4 MG SL tablet Place 1 tablet (0.4 mg total) under the tongue every 5 (five) minutes as needed for chest pain. max of 3 if no relief call 911 25 tablet PRN   pantoprazole  (PROTONIX ) 40 MG tablet TAKE 1 TABLET BY MOUTH EVERY DAY 90 tablet 1   predniSONE  (DELTASONE ) 20 MG tablet TAKE 1 TABLET BY MOUTH DAILY WITH BREAKFAST 30 tablet 3   rosuvastatin  (CRESTOR ) 20 MG tablet Take 1 tablet by mouth 3 times per week 40  tablet 5   sacubitril -valsartan  (ENTRESTO ) 97-103 MG Take 1 tablet by mouth 2 (two) times daily. 180 tablet 3   spironolactone  (ALDACTONE ) 25 MG tablet Take 1 tablet (25 mg total) by mouth daily. 30 tablet 11   sulfamethoxazole -trimethoprim  (BACTRIM  DS) 800-160 MG tablet TAKE 1 TABLET BY MOUTH 3 (THREE) TIMES A WEEK. TAKE ON MONDAYS, WEDNESDAYS, AND FRIDAYS. 12 tablet 3   triamcinolone  cream (KENALOG ) 0.1 % Apply 1 Application topically daily.     No current facility-administered medications for this visit.    PHYSICAL EXAMINATION: ECOG PERFORMANCE STATUS: 1 - Symptomatic but completely ambulatory  Vitals:   09/04/24 0758  BP: 118/64  Pulse: 93  Resp: 18  Temp: 97.7 F (36.5 C)  SpO2: 100%   Filed Weights   09/04/24 0758  Weight: 200 lb 3.2 oz (90.8 kg)      LABORATORY DATA:  I have reviewed the data as listed    Latest Ref Rng & Units 08/22/2024   10:34 AM 07/11/2024   11:01 AM 05/09/2024    9:53 AM  CMP  Glucose 70 - 99 mg/dL 78  93  88   BUN 8 - 27 mg/dL 12  12  12    Creatinine 0.57 - 1.00 mg/dL 9.16  9.19  9.13   Sodium 134 - 144 mmol/L 135  139  137   Potassium 3.5 - 5.2 mmol/L 4.7  4.4  4.8   Chloride 96 - 106 mmol/L 97  104  103   CO2 20 - 29 mmol/L 19  26  24    Calcium  8.7 - 10.3 mg/dL 89.9  9.6  9.4   Total Protein 6.5 - 8.1 g/dL   6.7   Total Bilirubin 0.0 - 1.2 mg/dL   0.8   Alkaline Phos 38 - 126 U/L   54   AST 15 - 41 U/L   16   ALT 0 - 44 U/L   15     Lab Results  Component Value Date   WBC 7.8 07/11/2024   HGB 11.9 (L) 07/11/2024   HCT 37.9 07/11/2024   MCV 86.9 07/11/2024   PLT 322 07/11/2024   NEUTROABS 4.7 07/11/2024    ASSESSMENT & PLAN:  Malignant neoplasm of upper-outer quadrant of right breast in female, estrogen receptor positive (HCC) 2017: [facility]: Right lumpectomy: T1c N1a M0 stage IIa IDC ER 100% PR 0% HER2 negative Status post 4 cycles of Taxotere and Cytoxan Followed by radiation Antiestrogen therapy with exemestane  started 2017 completed 2022   Current treatment:  Mammogram 12/13/2023: Benign breast density category B Breast exam 08/10/2024: Benign  Guardant reveal 08/29/2024: Positive for circulating tumor DNA less than 0.05% Recommendation: CT chest abdomen pelvis Bone scan   Follow-up with me after the scans are completed.  We discussed with the patient that there could  be potential false positives with this testing. Return to clinic 1 week after the scans to discuss results.      Orders Placed This Encounter  Procedures   CT CHEST ABDOMEN PELVIS W CONTRAST    Standing Status:   Future    Expected Date:   09/11/2024    Expiration Date:   09/04/2025    If indicated for the ordered procedure, I authorize the administration of contrast media per Radiology protocol:   Yes    Does the patient have a contrast media/X-ray dye allergy?:   No    Preferred imaging location?:   [facility]    Release to patient:   Immediate    If indicated for the ordered procedure, I authorize the administration of oral contrast media per Radiology protocol:   No    Reason for no oral contrast::   Breast   NM Bone Scan Whole Body    Standing Status:   Future    Expected Date:   09/11/2024    Expiration Date:   09/04/2025    If indicated for the ordered procedure, I authorize the administration of a radiopharmaceutical per Radiology protocol:   Yes    Preferred imaging location?:   [facility]    Release to patient:   Immediate   The patient has a good understanding of the overall plan. she agrees with it. she will call with any problems that may develop before the next visit here. Total time spent: 30 mins including face to face time and time spent for planning, charting and co-ordination of care   Naomi MARLA Chad, MD 09/04/24

## 2024-09-06 ENCOUNTER — Other Ambulatory Visit (HOSPITAL_COMMUNITY)

## 2024-09-06 ENCOUNTER — Ambulatory Visit: Admitting: Rehabilitation

## 2024-09-08 ENCOUNTER — Telehealth: Payer: Self-pay

## 2024-09-08 NOTE — Telephone Encounter (Signed)
 Per MD Guardant reveal order was emailed/scan to guardant rep.

## 2024-09-11 ENCOUNTER — Encounter: Admitting: Rehabilitation

## 2024-09-12 ENCOUNTER — Encounter (HOSPITAL_COMMUNITY)
Admission: RE | Admit: 2024-09-12 | Discharge: 2024-09-12 | Disposition: A | Discharge status: Home / Self Care | Source: Ambulatory Visit | Attending: Hematology and Oncology | Admitting: Hematology and Oncology

## 2024-09-12 ENCOUNTER — Encounter (HOSPITAL_COMMUNITY): Payer: Self-pay

## 2024-09-12 ENCOUNTER — Ambulatory Visit (HOSPITAL_COMMUNITY)
Admission: RE | Admit: 2024-09-12 | Discharge: 2024-09-12 | Disposition: A | Discharge status: Home / Self Care | Source: Ambulatory Visit | Attending: Hematology and Oncology | Admitting: Hematology and Oncology

## 2024-09-12 DIAGNOSIS — Z170 Estrogen receptor positive status [ER+]: Secondary | ICD-10-CM | POA: Diagnosis present

## 2024-09-12 DIAGNOSIS — C50411 Malignant neoplasm of upper-outer quadrant of right female breast: Secondary | ICD-10-CM

## 2024-09-12 MED ORDER — IOHEXOL 300 MG/ML  SOLN
100.0000 mL | Freq: Once | INTRAMUSCULAR | Status: AC | PRN
  Administered 2024-09-12: 100 mL via INTRAVENOUS

## 2024-09-12 MED ORDER — TECHNETIUM TC 99M MEDRONATE IV KIT
20.0000 | PACK | Freq: Once | INTRAVENOUS | Status: AC | PRN
  Administered 2024-09-12: 19.3 via INTRAVENOUS

## 2024-09-13 ENCOUNTER — Ambulatory Visit

## 2024-09-15 ENCOUNTER — Ambulatory Visit

## 2024-09-15 DIAGNOSIS — I472 Ventricular tachycardia, unspecified: Secondary | ICD-10-CM

## 2024-09-17 NOTE — Progress Notes (Incomplete)
 ***In Progress***   Advanced Heart Failure Clinic Note   Referring Physician: Delores Rojelio Caldron, NP  Primary Care: Delores Rojelio Caldron, NP Primary Cardiologist: Dr. Gardenia ***  HPI:  Marie Tanner is a 68 y.o. female with coronary artery disease status post STEMI, hypertension, chronic systolic heart failure, obstructive sleep apnea not on CPAP, type 2 diabetes, history of DVT, breast cancer.  It appears that Marie Tanner's cardiac history dates back to 2015 when she had a STEMI complicated by cardiac arrest at [facility].  She received 2 stents to the left main/LAD and circumflex.  During this time she had a prolonged hospitalization requiring tracheostomy; liver laceration during CPR which required surgical repair and also developed a DVT during this time.  She underwent repeat left heart cath in January 2019 that demonstrated patent stents which were heavily calcified with other areas of nonobstructive CAD.  EF at that time was 35 to 45%.  With increasing GDMT she had improvement in EF by late 2019 to 40 to 50%.  Due to moderate to severe eccentric mitral regurgitation she had a TEE in 2022 and repeat right and left heart cath with structural evaluation in May 2022 that showed patent left main and LAD stents normal filling pressures and a normal transmitral gradient.  She was deferred for medical management only.  She had a cardiac MRI with noncoronary distribution of LGE followed by a cardiac PET that showed FDG uptake consistent with active inflammation/sarcoid. Patient now undergoing treatment for cardiac sarcoid.    Presented to AHF Clinic 07/26/24 with Dr. Gardenia. Overall she had been doing fairly well from a symptomatic standpoint; She continued to cook, clean and remained very active with her church without many issues. No PND, orthopnea or lightheadedness. Compliant with all medications & immunosuppression; no clinical signs of infection.  Today she returns to HF clinic for  pharmacist medication titration. At last visit with MD no medication changes were made.   HF Medications: Metoprolol  succinate 25 mg daily  *** Entresto  97/103 mg BID *** Spironolactone  25 mg daily  *** Lasix  40 mg PRN ***  Has the patient been experiencing any side effects to the medications prescribed?  no  Does the patient have any problems obtaining medications due to transportation or finances?   No; UHC dual complete  Understanding of regimen: good Understanding of indications: good Potential of compliance: good Patient understands to avoid NSAIDs. Patient understands to avoid decongestants.    Pertinent Lab Values: (08/22/24) Serum creatinine 0.83 mg/dL, BUN 12 mg/dL, Potassium 4.7 mmol/L, Sodium 135 mmol/L  Vital Signs: Weight: 194.2 lbs (last clinic weight: 193.6 lbs) Blood pressure: *** 142/76 (has not taken AM BP medications yet today) *** Heart rate: 71 ***  Assessment/Plan: Heart failure with reduced EF Etiology of YQ:pdryzfpr cardiomyopathy; hx of STEMI with PCI to the LAD, Lcx and LM; CMR with noncoronary distribution of LGE with active uptake via Cardiac PET.  NYHA class / AHA Stage:NYHA IIB *** Volume status & Diuretics: Euvolemic, Continue Lasix  40 mg PRN only (takes 3x/week) *** Vasodilators:Continue  Entresto  97/103 mg BID *** Beta-Blocker: Continue metoprolol  25 mg daily.  *** FMJ:Rnwupwlz spironolactone  25 mg daily *** Cardiometabolic:SGLT2i led to yeast infection. Will continue to hold as she is now on immunosuppressives. *** Devices therapies & Valvulopathies: Drop in LVEF to 30-35% however improvement on PET to 40%. Repeat TTE 04/2024 EF 30-35%. Now s/p ICD implant. *** Advanced therapies:not indicated   2. Cardiac sarcoid - Vaccinations confirmed.  - PET from  02/09/23 w/ LVEF 37% - Currently on cellcept  1500 mg BID. *** - Restarted on prednisone  20mg  daily on 02/15/24.  *** - Continue bactrim  while on prednisone  doses >20 mg daily (3x weekly) *** -  Continue PPI *** - Repeat PET scan in 04/13/24 with improvement in FDG uptake; however, FDG still present in the apical anterior and apex.  - Repeat PET 07/26/24 with persistent FDG uptake that is unchanged to prior despite continued prednisone  dosing & cellcept . There was initially improvement in uptake from 02/09/24 to 04/13/24 on PET scans. She now only as uptake in the anteroapex/apical cap. Per Dr. Gardenia, low suspicion for this area to change in intensity.   3. Obesity - Body mass index is 35.41 kg/m. - Discussed side effects of prednisone  on weight and glucose.  - Stable; discussed importance of continued activity.    4. OSA - sleep study pending; needs split night sleep study.  - will schedule at follow up.    5. Moderate to severe MR - Moderate by TTE on 01/19/24.    Follow up ***  Morna Breach, PharmD PGY2 Cardiology Pharmacy Resident

## 2024-09-18 ENCOUNTER — Other Ambulatory Visit (HOSPITAL_COMMUNITY): Payer: Self-pay | Admitting: Cardiology

## 2024-09-18 ENCOUNTER — Ambulatory Visit (HOSPITAL_COMMUNITY)
Admission: RE | Admit: 2024-09-18 | Discharge: 2024-09-18 | Disposition: A | Discharge status: Home / Self Care | Source: Ambulatory Visit | Attending: Cardiology | Admitting: Cardiology

## 2024-09-18 ENCOUNTER — Ambulatory Visit

## 2024-09-18 ENCOUNTER — Ambulatory Visit (HOSPITAL_COMMUNITY)
Admission: RE | Admit: 2024-09-18 | Discharge: 2024-09-18 | Disposition: A | Discharge status: Home / Self Care | Source: Ambulatory Visit

## 2024-09-18 DIAGNOSIS — Z79899 Other long term (current) drug therapy: Secondary | ICD-10-CM | POA: Diagnosis not present

## 2024-09-18 DIAGNOSIS — I5022 Chronic systolic (congestive) heart failure: Secondary | ICD-10-CM | POA: Diagnosis present

## 2024-09-18 DIAGNOSIS — I2510 Atherosclerotic heart disease of native coronary artery without angina pectoris: Secondary | ICD-10-CM | POA: Diagnosis not present

## 2024-09-18 DIAGNOSIS — R002 Palpitations: Secondary | ICD-10-CM

## 2024-09-18 DIAGNOSIS — Z6835 Body mass index (BMI) 35.0-35.9, adult: Secondary | ICD-10-CM | POA: Insufficient documentation

## 2024-09-18 DIAGNOSIS — E119 Type 2 diabetes mellitus without complications: Secondary | ICD-10-CM | POA: Diagnosis not present

## 2024-09-18 DIAGNOSIS — Z483 Aftercare following surgery for neoplasm: Secondary | ICD-10-CM

## 2024-09-18 DIAGNOSIS — I340 Nonrheumatic mitral (valve) insufficiency: Secondary | ICD-10-CM | POA: Insufficient documentation

## 2024-09-18 DIAGNOSIS — D8685 Sarcoid myocarditis: Secondary | ICD-10-CM | POA: Diagnosis not present

## 2024-09-18 DIAGNOSIS — E669 Obesity, unspecified: Secondary | ICD-10-CM | POA: Diagnosis not present

## 2024-09-18 DIAGNOSIS — Z8674 Personal history of sudden cardiac arrest: Secondary | ICD-10-CM | POA: Insufficient documentation

## 2024-09-18 DIAGNOSIS — Z86718 Personal history of other venous thrombosis and embolism: Secondary | ICD-10-CM | POA: Insufficient documentation

## 2024-09-18 DIAGNOSIS — I252 Old myocardial infarction: Secondary | ICD-10-CM | POA: Insufficient documentation

## 2024-09-18 DIAGNOSIS — G4733 Obstructive sleep apnea (adult) (pediatric): Secondary | ICD-10-CM | POA: Diagnosis not present

## 2024-09-18 DIAGNOSIS — I255 Ischemic cardiomyopathy: Secondary | ICD-10-CM | POA: Insufficient documentation

## 2024-09-18 DIAGNOSIS — L599 Disorder of the skin and subcutaneous tissue related to radiation, unspecified: Secondary | ICD-10-CM

## 2024-09-18 DIAGNOSIS — I110 Hypertensive heart disease with heart failure: Secondary | ICD-10-CM | POA: Insufficient documentation

## 2024-09-18 DIAGNOSIS — C50411 Malignant neoplasm of upper-outer quadrant of right female breast: Secondary | ICD-10-CM

## 2024-09-18 DIAGNOSIS — I890 Lymphedema, not elsewhere classified: Secondary | ICD-10-CM

## 2024-09-18 DIAGNOSIS — M25611 Stiffness of right shoulder, not elsewhere classified: Secondary | ICD-10-CM

## 2024-09-18 LAB — CBC
HCT: 39 % (ref 36.0–46.0)
Hemoglobin: 12.3 g/dL (ref 12.0–15.0)
MCH: 27.2 pg (ref 26.0–34.0)
MCHC: 31.5 g/dL (ref 30.0–36.0)
MCV: 86.1 fL (ref 80.0–100.0)
Platelets: 257 K/uL (ref 150–400)
RBC: 4.53 MIL/uL (ref 3.87–5.11)
RDW: 13.7 % (ref 11.5–15.5)
WBC: 5.7 K/uL (ref 4.0–10.5)
nRBC: 0 % (ref 0.0–0.2)

## 2024-09-18 LAB — COMPREHENSIVE METABOLIC PANEL WITH GFR
ALT: 19 U/L (ref 0–44)
AST: 18 U/L (ref 15–41)
Albumin: 3.5 g/dL (ref 3.5–5.0)
Alkaline Phosphatase: 46 U/L (ref 38–126)
Anion gap: 8 (ref 5–15)
BUN: 11 mg/dL (ref 8–23)
CO2: 27 mmol/L (ref 22–32)
Calcium: 9.4 mg/dL (ref 8.9–10.3)
Chloride: 101 mmol/L (ref 98–111)
Creatinine, Ser: 0.85 mg/dL (ref 0.44–1.00)
GFR, Estimated: >60 mL/min (ref >60)
Glucose, Bld: 161 mg/dL — ABNORMAL HIGH (ref 70–99)
Potassium: 4.8 mmol/L (ref 3.5–5.1)
Sodium: 136 mmol/L (ref 135–145)
Total Bilirubin: 1.1 mg/dL (ref 0.0–1.2)
Total Protein: 6.4 g/dL — ABNORMAL LOW (ref 6.5–8.1)

## 2024-09-18 MED ORDER — PREDNISONE 10 MG PO TABS: 15.0000 mg | ORAL_TABLET | Freq: Every day | ORAL | 11 refills | Status: DC

## 2024-09-18 NOTE — Progress Notes (Signed)
 Zio patch placed onto patient.  All instructions and information reviewed with patient, they verbalize understanding with no questions.

## 2024-09-18 NOTE — Therapy (Signed)
 OUTPATIENT PHYSICAL THERAPY  UPPER EXTREMITY ONCOLOGY TREATMENT  Patient Name: Marie Tanner MRN: [id number] DOB:03-06-1956, 68 y.o., female Today's Date: 09/18/2024  END OF SESSION:  PT End of Session - 09/18/24 1014     Visit Number 7    Number of Visits 9    Date for Recertification  09/11/24    Authorization Type none needed    PT Start Time 1010   pt arrived late   PT Stop Time 1106    PT Time Calculation (min) 56 min    Activity Tolerance Patient tolerated treatment well    Behavior During Therapy [facility] for tasks assessed/performed            Past Medical History:  Diagnosis Date   Breast cancer (HCC)    Cancer (HCC)    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   GERD (gastroesophageal reflux disease)    HCAP (healthcare-associated pneumonia) 11/22/2014   Heart murmur    High cholesterol    History of blood transfusion 09/2014   related to MI   History of gout    Hypertension    Liver laceration 10/19/2014   Palpitations 12/12/2020   Pulmonary edema 10/19/2014   Renal disorder    Renal disorder    Right hip pain 04/03/2021   Sleep apnea    suppose to wear mask but I don't (11/22/2014)   STEMI (ST elevation myocardial infarction) (HCC) 10/19/2014   Type II diabetes mellitus (HCC)    Urticaria    Past Surgical History:  Procedure Laterality Date   ABDOMINAL HYSTERECTOMY  ~ 2000   APPLICATION OF WOUND VAC  10/2014   over my naval (11/22/2014)   BREAST LUMPECTOMY Right 2017   BREAST LUMPECTOMY WITH AXILLARY LYMPH NODE BIOPSY     BREAST SURGERY     CESAREAN SECTION  1980; 1982   CORONARY ANGIOPLASTY WITH STENT PLACEMENT  09/2014   multiple/notes 11/22/2014   EXTRACORPOREAL CIRCULATION     Femerol artery repair Right 09/2014   pt insists it was a right femoral artery repair  on 11/22/2014   ICD IMPLANT N/A 07/27/2024   Procedure: ICD IMPLANT;  Surgeon: Waddell Danelle ORN, MD;  Location[facility] INVASIVE CV LAB;  Service: Cardiovascular;  Laterality: N/A;   LAPAROSCOPIC ABDOMINAL EXPLORATION     LEFT HEART CATH AND CORONARY ANGIOGRAPHY N/A 12/07/2017   Procedure: LEFT HEART CATH AND CORONARY ANGIOGRAPHY;  Surgeon: Anner Alm ORN, MD;  Location: [facility] INVASIVE CV LAB;  Service: Cardiovascular;  Laterality: N/A;   LEFT VENTRICULAR ASSIST DEVICE     pt is not aware of this hx on 11/22/2014   Liver laceration repair  09/2014   related to CPR/notes 11/22/2014   RIGHT/LEFT HEART CATH AND CORONARY ANGIOGRAPHY N/A 04/21/2021   Procedure: RIGHT/LEFT HEART CATH AND CORONARY ANGIOGRAPHY;  Surgeon: Mady Bruckner, MD;  Location: [facility] INVASIVE CV LAB;  Service: Cardiovascular;  Laterality: N/A;   TEE WITHOUT CARDIOVERSION N/A 03/13/2021   Procedure: TRANSESOPHAGEAL ECHOCARDIOGRAM (TEE);  Surgeon: Loni Soyla LABOR, MD;  Location: [facility] ENDOSCOPY;  Service: Cardiology;  Laterality: N/A;   TRACHEOSTOMY  09/2014   closed on it's own when they took it out   TUBAL LIGATION  1982   Patient Active Problem List   Diagnosis Date Noted   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   Stented coronary artery 03/17/2023  Coronary artery disease involving native coronary artery of native heart without angina pectoris 10/13/2022   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) 04/06/2022   Type 2 diabetes mellitus with hyperglycemia, without long-term current use of insulin  (HCC) 04/06/2022   VT (ventricular tachycardia) (HCC) 04/06/2022   Class 2 severe obesity due to excess calories with serious comorbidity and body mass index (BMI) of 36.0 to 36.9 in adult 04/06/2022   Right hip pain 04/03/2021   Palpitations 12/12/2020   Allergic urticaria 10/31/2019   Dermographia 10/31/2019   Other allergic rhinitis 10/31/2019   Current use of beta blocker 10/31/2019   Allergy to insect bites 10/31/2019   Hx of  colonic polyp 01/03/2018   Angina, class III 12/07/2017   Abnormal nuclear stress test 12/07/2017   Type II diabetes mellitus (HCC)    Sleep apnea    Renal disorder    History of gout    Pure hypercholesterolemia    Heart murmur    GERD (gastroesophageal reflux disease)    CAD S/P percutaneous coronary angioplasty    Cancer [facility])    Malignant neoplasm of upper-outer quadrant of right breast in female, estrogen receptor positive (HCC)    Hyperlipidemia LDL goal <70 11/16/2016   History of breast cancer 01/24/2016   Abnormal finding on mammography 01/17/2016   Chest pain 06/06/2015   Diarrhea 06/06/2015   Essential hypertension 06/06/2015   Depression    Encounter for therapeutic drug monitoring 02/19/2015   Mitral regurgitation 01/30/2015   Ischemic heart disease due to coronary artery obstruction 01/30/2015   Chronic combined systolic and diastolic heart failure (HCC) 01/30/2015   Dyspnea 11/24/2014   Cardiomyopathy (HCC) 11/24/2014   DVT (deep venous thrombosis) (HCC) 11/24/2014   DM type 2 (diabetes mellitus, type 2) (HCC) 11/24/2014   HCAP (healthcare-associated pneumonia) 11/22/2014   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) 11/04/2014   Controlled type 2 diabetes mellitus without complication (HCC) 10/31/2014   High sodium levels 10/29/2014   Elevated WBC count 10/29/2014   Acute confusion due to medical condition 10/27/2014   Insomnia due to medical condition 10/27/2014   Acute respiratory failure (HCC) 10/24/2014   Injury of kidney 10/20/2014   Laceration of liver 10/20/2014   STEMI (ST elevation myocardial infarction) (HCC) 10/19/2014   Pulmonary edema 10/19/2014   Liver laceration 10/19/2014   History of blood transfusion 09/30/2014   Diabetes mellitus (HCC) 04/19/2013    PCP: Rojelio Daring, NP  REFERRING PROVIDER: Mackey Chad, MD  REFERRING DIAG: I89.0  THERAPY DIAG:  Malignant neoplasm of upper-outer quadrant of right breast in female,  estrogen receptor positive (HCC)  Aftercare following surgery for neoplasm  Disorder of the skin and subcutaneous tissue related to radiation, unspecified  Lymphedema, not elsewhere classified  Stiffness of right shoulder, not elsewhere classified  ONSET DATE: 2017  Rationale for Evaluation and Treatment: Rehabilitation  SUBJECTIVE:  SUBJECTIVE STATEMENT:  I'm sorry I missed some appts. 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 I've been doing the exercises and working on reaching into my cabinets which is a little better. I'm also sleeping in my new compression bra every night but I need to review the self MLD. I'm ready to make today my last session.    EVAL I have limited ROM.  It is having some swelling and pain come and go.  It was doing well and then got worse over time.  I need a new sleeve and glove.  I learned the massage but don't remember it .  The breast also swells and has pain off and on. No compression bras.    PERTINENT HISTORY: Rt ER+ breast cancer treated in 2017 with lumpectomy with unknown lymph node status , chemotherapy, radiation. MI 2015 with stent and defibrillator   PAIN:  Are you having pain? No, just tight in shoulder   PRECAUTIONS: None, can't lift more than 10# for 3 more weeks   RED FLAGS: None   WEIGHT BEARING RESTRICTIONS: No  FALLS:  Has patient fallen in last 6 months? 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  - needs help with the lifting   LEISURE: nothing now.    HAND DOMINANCE: right   PRIOR LEVEL OF FUNCTION: Independent  PATIENT GOALS: being able to use the Rt arm more    OBJECTIVE: Note: Objective measures were completed  at Evaluation unless otherwise noted.  COGNITION: Overall cognitive status: Within functional limits for tasks assessed   PALPATION: No pitting edema in the arm, no pitting in the breast,  fibrosis in the the lateral and inferior breast, tightness and decreased skin mobility under the breast and at the axilla where radiation desquamation occurred.    OBSERVATIONS / OTHER ASSESSMENTS: Rt breast nipple pulled inferiorly due to fibrosis and tightness    SENSATION: Some mild neuropathy hand and feets   POSTURE: rounded shoulders    UPPER EXTREMITY AROM/PROM:  A/PROM RIGHT   eval  Right  09/18/24  Shoulder extension 47 pn 55  Shoulder flexion 105 pn  129  Shoulder abduction 80 tight  94  Shoulder internal rotation To L3    Shoulder external rotation      (Blank rows = not tested)  A/PROM LEFT   eval  Shoulder extension 60  Shoulder flexion 143  Shoulder abduction 150  Shoulder internal rotation T9  Shoulder external rotation 85    (Blank rows = not tested)  UPPER EXTREMITY STRENGTH:   LYMPHEDEMA ASSESSMENTS:   LANDMARK RIGHT  eval  At axilla    15 cm proximal to olecranon process 41  10 cm proximal to olecranon process 40.8  Olecranon process 32.2  15 cm proximal to ulnar styloid process 26.5  10 cm proximal to ulnar styloid process 23.1  Just proximal to ulnar styloid process 20  Across hand at thumb web space 23  At base of 2nd digit 7.8  (Blank rows = not tested)  LANDMARK LEFT  eval  At axilla    15 cm proximal to olecranon process 39.5  10 cm proximal to olecranon process 36.3  Olecranon process 31  15 cm proximal to ulnar styloid process 25.7  10 cm proximal to ulnar styloid process 22.4  Just proximal to ulnar styloid process 19.7  Across hand at  thumb web space 22  At base of 2nd digit 8.0  (Blank rows = not tested)   QUICK DASH SURVEY: 61%  BREAST COMPLAINTS QUESTIONNAIRE    08/14/24 Pain:   4 Heaviness:  7 Swollen feeling: 8 Tense  Skin:  6 Redness:  3 Bra Print:  6 Size of Pores:  4 Hard feeling:   8 Total:   46 /80 A Score over 9 indicates lymphedema issues in the breast                                                                                                                           TREATMENT DATE:  Pt permission and consent throughout each step of examination and treatment with modification and draping if requested when working on sensitive areas  09/18/24: Therapeutic Exercises Pulleys into flex x 4 mins focusing on decreasing Rt scapular compensation, and then abd x 2 mins with tactile and VC's for both directions throughout; showed her how she can purchase over the door pulleys on [employer] yellow ball up wall into flex and then Rt UE abd x 5 each, tactile and VC's to decrease scap compensation and for correct UE position during due to weak scapula Rt UE er walk outs with red theraband in door x 5 reps encouraging small side steps today, pt instructed to eventually take larger steps as she gets stronger as long as she can keep forearm in neutral. Self Care Verbally reviewed current HEP and reviewed correct technique with demo and VC's to reinforce importance of decreasing scapular compensations. Also encouraged her to use her bathroom mirror for visual cuing to decrease scap compensation.  A/ROM measurements take for goal assess Manual Therapy Reviewed MLD to Rt breast and UE as follows: Short neck, 5 diaphragmatic breaths, Lt axillary and Rt inguinal nodes, anterior inter-axillary and Rt axillo-inguinal anastomosis, then Rt superior breast to anterior pathway, next into Lt S/L for focus to lateral breast redirecting towards lateral pathway (instructed her that S/L is easiest way to reach lateral breast) and then due to time constraints reviewed UE sequence with pt sitting EOB and just demonstrating sequence; pt and therapist were both intermittently doing steps with PTA offering VC's and hand over hand  pressure when required for reinforcement of lighter pressure. Handout issued with breast and UE combined on one handout as pt reports she lost her original one.   08/30/2024 Pulleys into flexion and scaption x each with VC's prn to depress scapula and monitoring UT Supine dowel AAROM flex x 10   Supine serratus punch 1# with VC and occasional TC for proper form x 10  Supine chest press with arm on towel roll with VC to keep shoulders down throughout the exercise  Supine scaption x 5 with VC's for scapular depression Standing rows  x 12 with YTB - tactile & VC to keep shoulders down and squeeze scapula together Standing extension x 12 with YTB - VC as needed to keep  shoulders down Manual Therapy STM to right UT, pectorals and mid delt with cocoa butter to decrease tissue tension PROM to pts right shoulder with VC's to relax; flexion, scaption, abd, IR and ER. Long axis distraction for relaxation  08/28/2024 Pulleys into flexion and abduction x each with VC's prn to depress scapula and monitoring UT Supine dowel AAROM flex & abd x 10 each  Supine serratus punch with VC for proper form x 10  Supine chest press with arm on towel roll with VC to keep shoulders down throughout the exercise  Supine scaption x 5 with VC's for scapular depression Standing rows  x 10 with YTB - tactile & VC to keep shoulders down and squeeze scapula together Standing extension x 10 with YTB - VC as needed to keep shoulders down Patient education on adjustments for standing flexion & abduction for HEP - advised patient to try completing this supine or seated on a table if standing is painful Manual Therapy STM to right UT, pectorals and mid delt with cocoa butter to decrease tissue tension PROM to pts right shoulder with VC's to relax; flexion, scaption, abd, IR and ER. Long axis distraction for relaxation     PATIENT EDUCATION:  Access Code: [email or web address] URL: [email or web address] Date:  08/23/2024 Prepared by: Grayce Sheldon  Exercises - Scapular Retraction with Resistance  - 1 x daily - 7 x weekly - 1 sets - 10 reps - Shoulder Extension with Resistance  - 1 x daily - 7 x weekly - 1 sets - 10 reps  09/18/24 - - Shoulder External Rotation Reactive Isometrics  - 1-2 x daily - 7 x weekly - 2-3 sets - 5 reps - 5 hold  Education details: per today's note Person educated: Patient Education method: Medical illustrator, tactile and VC's and handout issued Education comprehension: verbalized understanding, returned demo, and tactile and VC's  HOME EXERCISE PROGRAM: Get compression bra, swell spot   ASSESSMENT:  CLINICAL IMPRESSION: Pt has her compression bra and has been wearing this at night. Reports this very comfortable and encouraged her to wear this during the day as well for better results. Reviewed correct technique with some current HEP exs and added er walkouts as pt really struggles with scapular control, especially with abduction due to weakness. Also encouraged her to purchase over the door pulleys as she she still demonstrates mod-max compensations (abd elbow/engages UT as soon as she tries to lift her arm away from her body) and encouraged her to use these more for now in lieu of dowel unless she is in front of mirror to assess technique. Pt able to verbalize good understanding. Then reviewed self MLD to Rt breast and arm with hand over hand pressure and VC's for lighter pressure and skin stretch only (she did better with skin stretch than light pressure). Also reissued another handout as pt reports she lost her other one. Pt will be D/C today and knows if she needs to return on the future we would be happy to treat her with a new referral from her MD.   OBJECTIVE IMPAIRMENTS: decreased activity tolerance, decreased knowledge of condition, decreased knowledge of use of DME, decreased ROM, decreased strength, and impaired UE functional use.   ACTIVITY  LIMITATIONS: carrying and lifting  PARTICIPATION LIMITATIONS: community activity  PERSONAL FACTORS: Time since onset of injury/illness/exacerbation and 1-2 comorbidities: SLNB, past radiation are also affecting patient's functional outcome.   REHAB POTENTIAL: Excellent  CLINICAL DECISION MAKING: Stable/uncomplicated  EVALUATION COMPLEXITY: Low  GOALS: Goals reviewed with patient? Yes   STGs = LONG TERM GOALS: Target date: 09/11/24  Pt will obtain compression bra and swell spot for breast swelling management  Baseline: Pt has this now Goal status: MET  2.  Pt will obtain compression sleeve for arm swelling management Baseline: 09/18/24 - Out of network for first DME we tried, pt is going to call her insurance to find out where they're in network and let us  know where to place order. Goal status: ONGOING  3.  Pt will improve her Rt arm AROM to at least 140 deg of flexion and abduction to improve functional reach  Baseline: 09/18/24 - pt did make progress towards goal but had to miss 2 weeks of PT so did not quite meet goal: Flex 129 and Abd 94 degrees Goal status: UNMET  4.  Pt will be ind with self MLD for the Rt breast and UE Baseline: 09/18/24 - pt benefited from final review today Goal status: MET   PLAN:  PT FREQUENCY: 2x/week  PT DURATION: 4 weeks  PLANNED INTERVENTIONS: 97110-Therapeutic exercises, 97530- Therapeutic activity, 97112- Neuromuscular re-education, 97535- Self Care, [id number]- Manual therapy, Patient/Family education, Joint mobilization, Therapeutic exercises, Therapeutic activity, Neuromuscular re-education, and Self Care  PLAN FOR NEXT SESSION: D/C this visit.  09/18/24: Pt is going to call her insurance to ask where DME coverage. 9/29: Pt will be measured 9/29  Order Emailed to [facility] after no coverage at [facility]  Berwyn Knights, PTA 09/18/24 11:44 AM   PHYSICAL THERAPY DISCHARGE SUMMARY  Visits from Start of Care:  7  Current functional level related to goals / functional outcomes: See above   Remaining deficits: Lymphedema    Education / Equipment: Final self care plan   Plan: Patient agrees to discharge.  Patient is being discharged due to meeting the stated rehab goals.     Saddie Raw, PT 09/20/24, 10:11 AM

## 2024-09-18 NOTE — Patient Instructions (Signed)
 It was a pleasure seeing you today!  MEDICATIONS: -We are changing your medications today -Decrease prednisone  to 15 mg (1.5 tablets) daily -Stop Bactrim  (sulfamethoxazole -trimethoprim ) -Call if you have questions about your medications.  LABS: -We will call you if your labs need attention.  NEXT APPOINTMENT: Return to clinic in 1 month with Dr. Zenaida.  In general, to take care of your heart failure: -Limit your fluid intake to 2 Liters (half-gallon) per day.   -Limit your salt intake to ideally 2-3 grams (2000-3000 mg) per day. -Weigh yourself daily and record, and bring that weight diary to your next appointment.  (Weight gain of 2-3 pounds in 1 day typically means fluid weight.) -The medications for your heart are to help your heart and help you live longer.   -Please contact us  before stopping any of your heart medications.  Call the clinic at [phone] with questions or to reschedule future appointments.   Your provider has recommended that  you wear a Zio Patch for 14 days.  This monitor will record your heart rhythm for our review.  IF you have any symptoms while wearing the monitor please press the button.  If you have any issues with the patch or you notice a red or orange light on it please call the company at [phone].  Once you remove the patch please mail it back to the company as soon as possible so we can get the results.

## 2024-09-18 NOTE — Progress Notes (Signed)
 Advanced Heart Failure Clinic Note   Referring Physician: Delores Rojelio Caldron, NP  Primary Care: Delores Rojelio Caldron, NP Primary Cardiologist: Dr. Zenaida  HPI:  Marie Tanner is a 68 y.o. female with coronary artery disease status post STEMI, hypertension, chronic systolic heart failure, obstructive sleep apnea not on CPAP, type 2 diabetes, history of DVT, breast cancer.  It appears that [name]'s cardiac history dates back to 2015 when she had a STEMI complicated by cardiac arrest at [facility].  She received 2 stents to the left main/LAD and circumflex.  During this time she had a prolonged hospitalization requiring tracheostomy; liver laceration during CPR which required surgical repair and also developed a DVT during this time.  She underwent repeat left heart cath in January 2019 that demonstrated patent stents which were heavily calcified with other areas of nonobstructive CAD.  EF at that time was 35 to 45%.  With increasing GDMT she had improvement in EF by late 2019 to 40 to 50%.  Due to moderate to severe eccentric mitral regurgitation she had a TEE in 2022 and repeat right and left heart cath with structural evaluation in May 2022 that showed patent left main and LAD stents normal filling pressures and a normal transmitral gradient.  She was deferred for medical management only.  She had a cardiac MRI with noncoronary distribution of LGE followed by a cardiac PET that showed FDG uptake consistent with active inflammation/sarcoid. Patient now undergoing treatment for cardiac sarcoid.    Presented to AHF Clinic 07/26/24 with Dr. Gardenia. Overall she had been doing fairly well from a symptomatic standpoint; She continued to cook, clean and remained very active with her church without many issues.   Today she returns to HF clinic for pharmacist medication titration. At last visit with MD no medication changes were made. Patient reports feeling well today, but concerned about having  frequent episodes of palpitations since ICD implantation in 06/2024. Noted to have palpitations daily that occur randomly per patient report. Denies chest pain. Patient reports two isolated episodes of dizziness that lasted one day each time in the past month. Patient reported increased activity level and decrease water intake each of these days. Denies dizziness or orthostasis outside of these events. Patient checks blood pressure 3x per week at home before taking medication, reports BP stable ~125/80 mmHg. Reports worsening SOB when talking or walking up stairs, no symptoms at rest.  Noted weight is up 10 lbs in clinic today. No PND or orthopnea noted. Trace LEE noted. Reports taking furosemide  twice in past week due to weight gain 2-3 lbs overnight.  HF Medications: Metoprolol  succinate 25 mg daily Entresto  97/103 mg twice daily Spironolactone  25 mg daily Lasix  40 mg PRN - takes ~2x per week  Has the patient been experiencing any side effects to the medications prescribed?  No  Does the patient have any problems obtaining medications due to transportation or finances?   No   Understanding of regimen: good Understanding of indications: good Potential of compliance: good Patient understands to avoid NSAIDs. Patient understands to avoid decongestants.   Pertinent Lab Values: (08/22/24) Serum creatinine 0.83 mg/dL, BUN 12 mg/dL, Potassium 4.7 mmol/L, Sodium 135 mmol/L  Vital Signs: Weight: 203.4 lbs (last clinic weight: 193.6 lbs) Blood pressure: 122/78 mmHg Heart rate: 90 bpm  Assessment/Plan: Heart failure with reduced EF Etiology of YQ:pdryzfpr cardiomyopathy; hx of STEMI with PCI to the LAD, Lcx and LM; CMR with noncoronary distribution of LGE with active uptake via Cardiac PET.  NYHA class / AHA Stage:NYHA IIB Volume status & Diuretics: Euvolemic on exam, continue Lasix  40 mg PRN only (takes 2-3x/week) Vasodilators:Continue Entresto  97/103 mg BID. Dizziness seems to be associated  with isolated hypovolemia Beta-Blocker: Continue metoprolol  25 mg daily. Consider dose increase at future appointment for ongoing palpitations. FMJ:Rnwupwlz spironolactone  25 mg daily Cardiometabolic:SGLT2i led to yeast infection. Will continue to hold as she is now on immunosuppressives. Devices therapies & Valvulopathies: Drop in LVEF to 30-35% however improvement on PET to 40%. Repeat TTE 04/2024 EF 30-35%. Now s/p ICD implant. Patient having ongoing palpitations, Zio patch placed during clinic visit today by CMA. Will schedule follow-up with Dr. Zenaida in 1 month. Patient aware to reach out sooner if palpitations worsen.  Advanced therapies: not indicated   2. Cardiac sarcoid - Vaccinations confirmed.  - PET from 02/09/23 w/ LVEF 37% - Currently on cellcept  1500 mg BID - Decrease prednisone  to 15 mg daily. Discussed plan with Dr. Zenaida today.  - Discontinue bactrim  due to prednisone  dose decreasing below 20 mg daily and ongoing risk of hyperkalemia - Continue PPI, Vitamin D, and calcium  - Recheck Cmet and CBC in clinic today - Repeat PET scan in 04/13/24 with improvement in FDG uptake; however, FDG still present in the apical anterior and apex.  - Repeat PET 07/26/24 with persistent FDG uptake that is unchanged to prior despite continued prednisone  dosing & cellcept . There was initially improvement in uptake from 02/09/24 to 04/13/24 on PET scans. She now only as uptake in the anteroapex/apical cap. Per Dr. Gardenia, low suspicion for this area to change in intensity.   3. Obesity - Body mass index is 35.41 kg/m. - Discussed side effects of prednisone  on weight and glucose.  - Stable; discussed importance of continued activity.    4. OSA - sleep study pending; needs split night sleep study.  - will schedule at follow up.    5. Moderate to severe MR - Moderate by TTE on 01/19/24.    Follow up in 1 month with Dr. Zenaida Morna Breach, PharmD PGY2 Cardiology Pharmacy  Resident  Please do not hesitate to reach out with questions or concerns,  Jaun Bash, PharmD, CPP, BCPS, [facility] Heart Failure Pharmacist  Phone - [phone] 09/18/2024 4:31 PM

## 2024-09-18 NOTE — Patient Instructions (Signed)
 Cancer Rehab  [phone] Self manual lymph drainage: Perform this sequence once a day.  Only give enough pressure to your skin to make the skin move.  Hug yourself.  Do circles at your neck just above your collarbones.  Repeat this 10 times.  Deep Effective Breath: Diaphragmatic - Supine   Inhale through nose making navel move out toward hands. Exhale through puckered lips, hands follow navel in. Resist inhale with hands. 5 times.   Axilla to Axilla - Sweep   On uninvolved side make 5 circles in the armpit, then pump _5__ times from involved armpit across chest to uninvolved armpit, making a pathway. Do _1__ time per day.  Copyright  VHI. All rights reserved.  Axilla to Inguinal Nodes - Sweep   On involved side, make 5 circles at groin at panty line, then pump _5__ times from armpit along side of trunk to outer hip, making your other pathway. Do __1_ time per day.  BREAST SEQUENCE:  Draw an imaginary diagonal line from upper outer breast through the nipple area toward lower inner breast.  Direct fluid upward and inward from this line toward the pathway across your upper chest .  Do this in three rows to treat all of the upper inner breast tissue, and do each row 3-4x.      Direct fluid to treat all of lower outer breast tissue downward and outward toward      pathway that is aimed at the left groin.   Arm Posterior: Elbow to Shoulder - Sweep   Pump _5__ times from back of elbow to top of shoulder. Then inner to outer upper arm _5_ times, then outer arm again _5_ times. Then back to the pathways _2-3_ times. Do _1__ time per day.  Copyright  VHI. All rights reserved.  ARM: Volar Wrist to Elbow - Sweep   Pump or stationary circles _5__ times from wrist to elbow making sure to do both sides of the forearm. Then retrace your steps to the outer arm, and the pathways _2-3_ times each. Do _1__ time per day.  Copyright  VHI. All rights reserved.  ARM: Dorsum of Hand to Shoulder -  Sweep   Pump or stationary circles _5__ times on back of hand including knuckle spaces and individual fingers if needed working up towards the wrist, then retrace all your steps working back up the forearm, doing both sides; upper outer arm and back to your pathways _2-3_ times each. Then do 5 circles again at uninvolved armpit and involved groin where you started! Good job!! Do __1_ time per day.

## 2024-09-19 ENCOUNTER — Encounter: Payer: Self-pay | Admitting: Podiatry

## 2024-09-19 ENCOUNTER — Ambulatory Visit: Admitting: Podiatry

## 2024-09-19 ENCOUNTER — Other Ambulatory Visit: Payer: Self-pay | Admitting: Medical Genetics

## 2024-09-19 DIAGNOSIS — E1142 Type 2 diabetes mellitus with diabetic polyneuropathy: Secondary | ICD-10-CM

## 2024-09-19 DIAGNOSIS — M79675 Pain in left toe(s): Secondary | ICD-10-CM | POA: Diagnosis not present

## 2024-09-19 DIAGNOSIS — Q828 Other specified congenital malformations of skin: Secondary | ICD-10-CM | POA: Diagnosis not present

## 2024-09-19 DIAGNOSIS — M2041 Other hammer toe(s) (acquired), right foot: Secondary | ICD-10-CM

## 2024-09-19 DIAGNOSIS — B353 Tinea pedis: Secondary | ICD-10-CM

## 2024-09-19 DIAGNOSIS — Z0189 Encounter for other specified special examinations: Secondary | ICD-10-CM | POA: Diagnosis not present

## 2024-09-19 DIAGNOSIS — B351 Tinea unguium: Secondary | ICD-10-CM

## 2024-09-19 DIAGNOSIS — E119 Type 2 diabetes mellitus without complications: Secondary | ICD-10-CM

## 2024-09-19 DIAGNOSIS — M79674 Pain in right toe(s): Secondary | ICD-10-CM | POA: Diagnosis not present

## 2024-09-19 DIAGNOSIS — M2042 Other hammer toe(s) (acquired), left foot: Secondary | ICD-10-CM

## 2024-09-19 LAB — CUP PACEART REMOTE DEVICE CHECK
Battery Remaining Longevity: 180 mo
Battery Remaining Percentage: 100 %
Brady Statistic RV Percent Paced: 0 %
Date Time Interrogation Session: 20251017020100
HighPow Impedance: 87 Ohm
Implantable Lead Connection Status: 753985
Implantable Lead Implant Date: 20250828
Implantable Lead Location: 753860
Implantable Lead Model: 137
Implantable Lead Serial Number: 302079
Implantable Pulse Generator Implant Date: 20250828
Lead Channel Impedance Value: 942 Ohm
Lead Channel Pacing Threshold Amplitude: 0.7 V
Lead Channel Pacing Threshold Pulse Width: 0.4 ms
Lead Channel Setting Pacing Amplitude: 3.5 V
Lead Channel Setting Pacing Pulse Width: 0.4 ms
Lead Channel Setting Sensing Sensitivity: 0.6 mV
Pulse Gen Serial Number: 224974
Zone Setting Status: 755011

## 2024-09-19 MED ORDER — CLOTRIMAZOLE-BETAMETHASONE 1-0.05 % EX CREA: TOPICAL_CREAM | CUTANEOUS | 2 refills | Status: AC

## 2024-09-20 ENCOUNTER — Ambulatory Visit: Payer: Self-pay | Admitting: Internal Medicine

## 2024-09-22 NOTE — Progress Notes (Signed)
 Remote ICD Transmission

## 2024-09-24 NOTE — Progress Notes (Signed)
 Subjective:  Patient ID: Marie Tanner, female    DOB: 03/24/1956,  MRN: [id number]  Marie Tanner presents to clinic today for for annual diabetic foot examination, at risk foot care. Pt has h/o NIDDM with PAD, and callus(es) of both feet and painful thick toenails that are difficult to trim. Painful toenails interfere with ambulation. Aggravating factors include wearing enclosed shoe gear. Pain is relieved with periodic professional debridement. Painful calluses are aggravated when weightbearing with and without shoegear. Pain is relieved with periodic professional debridement.  Chief Complaint  Patient presents with   Diabetes    Patient stated that her last A1c was 6.5 and she last saw her PCP in August. Patient stated that her PCP name is  Rojelio Daring    New problem(s): None.   PCP is Daring Rojelio Caldron, NP.  Allergies  Allergen Reactions   Dilaudid [Hydromorphone Hcl] Anaphylaxis, Itching and Swelling   Epinephrine  Anxiety and Other (See Comments)    Severe anxiety   Lactose Intolerance (Gi) Diarrhea   Tilactase     Other Reaction(s): Diarrhea   Jardiance  [Empagliflozin ]     YEAST INFECTION    Shellfish Allergy     Review of Systems: Negative except as noted in the HPI.  Objective: No changes noted in today's physical examination. There were no vitals filed for this visit. Marie Tanner is a pleasant 68 y.o. female in NAD. AAO x 3.   Diabetic foot exam was performed with the following findings:   Vascular Examination: Capillary refill time immediate b/l. Vascular status intact b/l with palpable DP pulses; faintly palpable PT pulses. Pedal hair absent b/l. No edema. No pain with calf compression b/l. Skin temperature gradient WNL b/l. No cyanosis or clubbing noted b/l. No ischemia or gangrene b/l. Trace edema noted BLE.  Neurological Examination: Sensation grossly intact b/l with 10 gram monofilament. Vibratory sensation intact b/l. Pt has subjective symptoms of  neuropathy.  Dermatological Examination: Skin warm and supple b/l.  No open wounds. No interdigital macerations. Diffuse scaling noted peripherally and plantarly b/l feet.  No interdigital macerations.  No blisters, no weeping. No signs of secondary bacterial infection noted.  Toenails 1-5 b/l thick, discolored, elongated with subungual debris and pain on dorsal palpation.  Hyperkeratotic lesion(s) submet head 2 left foot, submet head 2 right foot, submet head 3 left foot, and submet head 3 right foot.  No erythema, no edema, no drainage, no fluctuance.  Musculoskeletal Examination: Muscle strength 5/5 to all lower extremity muscle groups bilaterally. Hammertoe(s) 2-5 b/l.SABRA No pain, crepitus or joint limitation noted with ROM b/l LE.  Patient ambulates independently without assistive aids.  Radiographs: None     Assessment/Plan: 1. Pain due to onychomycosis of toenails of both feet   2. Porokeratosis   3. Tinea pedis of both feet   4. Acquired hammertoes of both feet   5. Encounter for diabetic foot exam (HCC)   6. Diabetic peripheral neuropathy associated with type 2 diabetes mellitus (HCC)     Meds ordered this encounter  Medications   clotrimazole -betamethasone  (LOTRISONE ) cream    Sig: Apply to feet and between toes twice daily for 6 weeks.    Dispense:  45 g    Refill:  2  Patient was evaluated and treated. All patient's and/or POA's questions/concerns addressed on today's visit. Toenails 1-5 b/l debrided in length and girth without incident. Porokeratotic lesion(s) submet head 2 b/l and submet head 3 b/l pared with sharp debridement without incident. Continue daily foot inspections and  monitor blood glucose per PCP/Endocrinologist's recommendations. Continue soft, supportive shoe gear daily. Report any pedal injuries to medical professional. Call office if there are any questions/concerns. -Discussed tinea pedis infection. To prevent re-infection of tinea pedis,  patient/POA/caregiver instructed to spray shoes with Lysol every evening. -For tinea pedis, prescription written for Lotrisone  Cream 1%/0.05% to be applied to affected foot/feet twice daily for 6 weeks. -Patient/POA to call should there be question/concern in the interim.   Return in about 3 months (around 12/20/2024).  Delon LITTIE Merlin, DPM      [place] LOCATION: [address], [place] [address]                   Office [phone]   [facility] LOCATION: [address] [place], [place] [address] Office [phone]

## 2024-09-29 ENCOUNTER — Telehealth: Payer: Self-pay | Admitting: Pharmacy Technician

## 2024-09-29 NOTE — Telephone Encounter (Signed)
    Pharmacy Patient Advocate Encounter   Received notification from Onbase that prior authorization for REPATHA  is required/requested.   Insurance verification completed.   The patient is insured through [employer].   Per test claim: PA required; PA submitted to above mentioned insurance via Latent Key/confirmation #/EOC [id number] Status is pending

## 2024-09-29 NOTE — Telephone Encounter (Signed)
 Pharmacy Patient Advocate Encounter  Received notification from [employer] that Prior Authorization for repatha  has been APPROVED from 09/29/24 to 11/29/25   PA #/Case ID/Reference #: [id number]

## 2024-10-02 ENCOUNTER — Encounter: Payer: Self-pay | Admitting: Radiology

## 2024-10-03 ENCOUNTER — Ambulatory Visit: Admitting: Podiatry

## 2024-10-03 ENCOUNTER — Encounter: Payer: Self-pay | Admitting: Podiatry

## 2024-10-03 DIAGNOSIS — B353 Tinea pedis: Secondary | ICD-10-CM | POA: Diagnosis not present

## 2024-10-03 MED ORDER — TERBINAFINE HCL 250 MG PO TABS: 250.0000 mg | ORAL_TABLET | Freq: Every day | ORAL | 0 refills | Status: DC

## 2024-10-03 NOTE — Progress Notes (Signed)
 Chief Complaint  Patient presents with   Tinea Pedis    Follow up Rash bilateral. Using Lotrisone  cream. Still some itching. Patient states skin peeling and starting to look better. NIDDM A1C 6.2.    HPI: 68 y.o. female presents today for follow-up of tinea pedis.  This is a patient of Dr. May and was referred for follow-up of the tinea pedis.  She was prescribed Lotrisone  cream 2 weeks ago to help with the itching and the rash.  Patient states that she might be approximately 20% better.  She does state that itching has improved.  She notes that the rash/peeling is spreading up the top of both feet as well as the back of both legs.  The left is worse than the right.  She has been spraying Lysol in her shoes and did purchase some multiple new socks recently.  She does not have any significant issue with sweaty feet other than during temperature changes to warmer weather.  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   GERD (gastroesophageal reflux disease)    HCAP (healthcare-associated pneumonia) 11/22/2014   Heart murmur    High cholesterol    History of blood transfusion 09/2014   related to MI   History of gout    Hypertension    Liver laceration 10/19/2014   Palpitations 12/12/2020   Pulmonary edema 10/19/2014   Renal disorder    Renal disorder    Right hip pain 04/03/2021   Sleep apnea    suppose to wear mask but I don't (11/22/2014)   STEMI (ST elevation myocardial infarction) (HCC) 10/19/2014   Type II diabetes mellitus (HCC)    Urticaria    Past Surgical History:  Procedure Laterality Date   ABDOMINAL HYSTERECTOMY  ~ 2000   APPLICATION OF WOUND VAC  10/2014   over my naval (11/22/2014)   BREAST LUMPECTOMY  Right 2017   BREAST LUMPECTOMY WITH AXILLARY LYMPH NODE BIOPSY     BREAST SURGERY     CESAREAN SECTION  1980; 1982   CORONARY ANGIOPLASTY WITH STENT PLACEMENT  09/2014   multiple/notes 11/22/2014   EXTRACORPOREAL CIRCULATION     Femerol artery repair Right 09/2014   pt insists it was a right femoral artery repair on 11/22/2014   ICD IMPLANT N/A 07/27/2024   Procedure: ICD IMPLANT;  Surgeon: Waddell Danelle ORN, MD;  Location: [facility] INVASIVE CV LAB;  Service: Cardiovascular;  Laterality: N/A;   LAPAROSCOPIC ABDOMINAL EXPLORATION     LEFT HEART CATH AND CORONARY ANGIOGRAPHY N/A 12/07/2017   Procedure: LEFT HEART CATH AND CORONARY ANGIOGRAPHY;  Surgeon: Anner Alm ORN, MD;  Location: [facility] INVASIVE CV LAB;  Service: Cardiovascular;  Laterality: N/A;   LEFT VENTRICULAR ASSIST DEVICE     pt is not aware of this hx on 11/22/2014   Liver laceration repair  09/2014   related to CPR/notes 11/22/2014   RIGHT/LEFT HEART CATH AND CORONARY ANGIOGRAPHY N/A 04/21/2021   Procedure: RIGHT/LEFT HEART CATH AND CORONARY ANGIOGRAPHY;  Surgeon: Mady Bruckner, MD;  Location: [facility] INVASIVE CV LAB;  Service: Cardiovascular;  Laterality: N/A;   TEE WITHOUT CARDIOVERSION N/A 03/13/2021   Procedure: TRANSESOPHAGEAL ECHOCARDIOGRAM (TEE);  Surgeon: Loni Soyla LABOR, MD;  Location: [facility] ENDOSCOPY;  Service: Cardiology;  Laterality: N/A;   TRACHEOSTOMY  09/2014   closed on it's own when they took it out   TUBAL LIGATION  1982   Allergies  Allergen Reactions   Dilaudid [Hydromorphone Hcl] Anaphylaxis, Itching and Swelling   Epinephrine  Anxiety and Other (See Comments)    Severe anxiety   Lactose Intolerance (Gi) Diarrhea   Tilactase     Other Reaction(s): Diarrhea   Jardiance  [Empagliflozin ]     YEAST INFECTION    Shellfish Allergy    Review of Systems  Skin:  Positive for itching and rash.     Physical Exam: There is no evidence of blistering or pustule formation.  The entire plantar aspect of both feet coming  up the medial and lateral sides as well as the posterior aspect of both legs with the left more superiorly than the right do have scaling and peeling noted.  There is some hyperpigmentation/erythema present.  No deep cracks or fissures are noted.  Palpable pedal pulses are noted.  Mild generalized edema to the lower legs and ankles.    Assessment/Plan of Care: 1. Tinea pedis of both feet      Meds ordered this encounter  Medications   terbinafine  (LAMISIL ) 250 MG tablet    Sig: Take 1 tablet (250 mg total) by mouth daily.    Dispense:  30 tablet    Refill:  0   Discussed findings with patient today.  I am concerned about the tinea pedis starting to spread approximately legs.  Will get her started on oral terbinafine  250 mg 1 tab p.o. daily x 30 days.  Her most recent liver enzymes were reviewed and are within normal limits so that should be safe to proceed with this medication for a 30-day course.  She will continue with the topical Lotrisone  cream.  Recommended over-the-counter Zeasorb-AF powder to sprinkle into the socks and shoes for moisture management as well as fungal management.  Follow-up in 1 month   Romelle Muldoon D. Anahy Esh, DPM, FACFAS [facility]     [address] [place], [place] [address]                Office [phone]  Fax [phone]

## 2024-10-06 ENCOUNTER — Ambulatory Visit (HOSPITAL_BASED_OUTPATIENT_CLINIC_OR_DEPARTMENT_OTHER): Admitting: Pulmonary Disease

## 2024-10-06 DIAGNOSIS — G4733 Obstructive sleep apnea (adult) (pediatric): Secondary | ICD-10-CM | POA: Insufficient documentation

## 2024-10-12 NOTE — Addendum Note (Signed)
 Encounter addended by: Debarah Garrison MATSU, RN on: 10/12/2024 3:17 PM  Actions taken: Imaging Exam ended

## 2024-10-13 DIAGNOSIS — G4733 Obstructive sleep apnea (adult) (pediatric): Secondary | ICD-10-CM | POA: Diagnosis not present

## 2024-10-13 NOTE — Procedures (Signed)
 [facility] [address] [place], [place] [address] Tel: [phone]   Fax: [phone]  Polysomnography Interpretation  Patient Name:  Marie Tanner, Marie Tanner Date:  10/06/2024 Referring Physician:  Theodoro Lakes MD %%startinterp%% Indications for Polysomnography The patient is a 68 year-old Female who is 5' 2 and weighs 200.0 lbs. Her BMI equals 36.8.  A full night polysomnogram was performed to evaluate for reassessment of known OSA after weight gain & h/o HFrEF.  Polysomnogram Data A full night polysomnogram recorded the standard physiologic parameters including EEG, EOG, EMG, EKG, nasal and oral airflow.  Respiratory parameters of chest and abdominal movements were recorded with Respiratory Inductance Plethysmography belts.  Oxygen saturation was recorded by pulse oximetry.   Sleep Architecture The total recording time of the polysomnogram was 373.5 minutes.  The total sleep time was 239.0 minutes.  The patient spent 15.9% of total sleep time in Stage N1, 73.6% in Stage N2, 3.8% in Stages N3, and 6.7% in REM.  Sleep latency was 4.0 minutes.  REM latency was 237.5 minutes.  Sleep Efficiency was 64.0%.  Wake after Sleep Onset time was 130.5 minutes.  Respiratory Events The polysomnogram revealed a presence of 14 obstructive, 7 central, and 1 mixed apneas resulting in an Apnea index of 5.5 events per hour.  There were 95 hypopneas (>=3% desaturation and/or arousal) resulting in an Apnea\Hypopnea Index (AHI >=3% desaturation and/or arousal) of 29.4 events per hour.  There were 19 hypopneas (>=4% desaturation) resulting in an Apnea\Hypopnea Index (AHI >=4% desaturation) of 10.3 events per hour.  There were 23 Respiratory Effort Related Arousals resulting in a RERA index of 5.8 events per hour. The Respiratory Disturbance Index is 35.1 events per hour.  The snore index was 0.5 events per hour.  Mean oxygen saturation was 96.0%.  The lowest oxygen saturation  during sleep was 86.0%.  Time spent <=88% oxygen saturation was 0.3 minutes (0.1%).  Limb Activity There were 32 total limb movements recorded, of this total, 12 were classified as PLMs.  PLM index was 3.0 per hour and PLM associated with Arousals index was 2.0 per hour.  Cardiac Summary The average pulse rate was 73.8 bpm.  The minimum pulse rate was 53.0 bpm while the maximum pulse rate was 100.0 bpm.  Cardiac rhythm was normal/abnormal.  Comments: She slept supine all night on 3 pillows. She had difficulty falling and staying asleep. TST 4h. Apneas, hypopneas and RERA's  were observed. Very few centrals. The patient also had numerous arousals from what appeared to be jaw clenching more than grinding/bruxism. This occurred throughout the entire study.  Diagnosis: Mild OSA  Recommendations: Options of treatment for mild obstructive sleep apnea include oral appliance or CPAP therapy. The cardiovascular implications of mild OSA are minimal Watchful waiting with focus on weight loss and sleep position modification may also be appropriate if not symptomatic. Sleep position optimization by encouraging sleep in a lateral position, elevating the head of the bed by about 30 degrees may help Weight loss measures encouraged She should be cautioned against driving when sleepy & against medications with sedative side effects    This study was personally reviewed and electronically signed by: Harden Staff, MD Accredited Board Certified in Sleep Medicine

## 2024-10-14 NOTE — Progress Notes (Signed)
 ADVANCED HEART FAILURE FOLLOW UP CLINIC NOTE  Referring Physician: Delores Rojelio Caldron, NP  Primary Care: Delores Rojelio Caldron, NP Primary Cardiologist:  HPI: Marie Tanner is a 68 y.o. female who presents for follow up of chronic systolic heart failure.      Patient with a cardiac history dating to 2015 when she had a STEMI with cardiac arrest at [facility]. PCI to the left main/LAD and Lcx, prolonged hospitalization at that time.   She underwent repeat left heart cath in January 2019 that demonstrated patent stents which were heavily calcified with other areas of nonobstructive CAD. EF at that time was 35 to 45%. With increasing GDMT she had improvement in EF by late 2019 to 40 to 50%. Due to moderate to severe eccentric mitral regurgitation she had a TEE in 2022 and repeat right and left heart cath with structural evaluation in May 2022 that showed patent left main and LAD stents normal filling pressures and a normal transmitral gradient. She was deferred for medical management only.  She had a cardiac MRI with noncoronary distribution of LGE followed by a cardiac PET that showed FDG uptake consistent with active inflammation/sarcoid. Patient now undergoing treatment for cardiac sarcoid.      SUBJECTIVE:  Overall she is doing well, no issues since going down on the prednisone . She denies any syncope or chest pain. We discussed her zio patch results. She reports ongoing issues with palpitations that started after the ICD was placed. There are no clear triggers that she can identify. Zio patch with rare SVT/VT, but triggered events during normal sinus rhythm.   PMH, current medications, allergies, social history, and family history reviewed in epic.  PHYSICAL EXAM: Vitals:   10/16/24 0907  BP: 122/70  Pulse: (!) 55  SpO2: 100%   GENERAL: Well nourished and in no apparent distress at rest.  PULM:  Normal work of breathing, clear to auscultation bilaterally. Respirations are unlabored.   CARDIAC:  JVP: flat         Normal rate with regular rhythm. No murmurs, rubs or gallops.  No edema. Warm and well perfused extremities. ABDOMEN: Soft, non-tender, non-distended. NEUROLOGIC: Patient is oriented x3 with no focal or lateralizing neurologic deficits.    DATA REVIEW  ECG: 03/26/23: NSR  As per my personal interpretation   ECHO: 05/07/22: LVEF 35%-40%, eccentric moderate - severe MR, normal RV function As per my personal interpretation 01/19/24: LVEF 30-35%; moderate mR.    CATH: 04/21/21:   Nonobstructive coronary artery disease with up to 40% stenosis involving the mid LAD and mid LCx.  Overall appearance is similar to prior catheterization in 2019. Patent LMCA/LAD stent. Normal left and right heart filling pressures. Mild pulmonary hypertension. Normal to supranormal Fick cardiac output/index. Normal transmitral gradient.  CMR: Subendocardial LGE in the paical anterior and mid to apical inferior walls. Focal area of subendocardial LGE in the basal anteroseptum, mild inferoseptal midwall LGE. Suggest mixed CM  PET: 01/2023: FDG in the apical to mid anterior, septal, and apex segments. LAD infarct pattern as well.   01/2024: FDG in the apical to mid anterior, inferior, septal, and apical segments. LAD infarct patter.  03/2024: FDG only seen in the anterioapex and apical cap 06/2024: Minimal change since 03/2024   ASSESSMENT & PLAN:  Chronic systolic heart failure: Predominant ischemic cardiomyopathy, prior STEMI in 2015. Also component of cardiac sarcoid with improvement on treatment. After review of imaging and reassuring zio patch would plan to further reduce prednisone  with plan to  wean to off and repeat PET imaging. - Increase metoprolol  succinate to 25mg  AM/12.5mg  PM - Continue entresto  97/103mg  BID - Continue spironolactone  50mg  daily - SGLT2 with prior yeast infection - S/p ICD 04/2024 - NYHA class II - Wean prednisone  to 10mg  today. In 4 weeks can wean to 5mg  -  Pharmacy follow up in 6 weeks, can hopefully wean off and order PET at that visit  Cardiac sarcoid: - Vaccinations confirmed - PET improving as noted above - Continue cellcept  1500mg  BID - Off bactrim  - Continue to wean off pred as noted above  Obesity:  -  Sleep study positive, CPAP titration - Consider Zepbound at pharmacy follow up  Moderate MR:  - Stable on imaging   Follow up in 3 months I spent 42 minutes caring for this patient today including face to face time, ordering and reviewing labs, reviewing records from prior HF visits, PET scans, pharmacy medication titration, seeing the patient, documenting in the record, and arranging follow ups.   Marie Brownie, MD Advanced Heart Failure Mechanical Circulatory Support 10/16/24

## 2024-10-15 ENCOUNTER — Telehealth: Payer: Self-pay

## 2024-10-15 DIAGNOSIS — G4733 Obstructive sleep apnea (adult) (pediatric): Secondary | ICD-10-CM

## 2024-10-15 NOTE — Telephone Encounter (Signed)
 Date of Study: 10/06/24  Interpretation: Mild and Moderate OSA with AHI of 10.4, O2 desaturation <1 min. O2 nadir 86%.   Plan: Initiate auto CPAP at 6-15 cm H2O and provide supplies.   Sammi Fredericks, MD.

## 2024-10-16 ENCOUNTER — Encounter (HOSPITAL_COMMUNITY): Payer: Self-pay | Admitting: Cardiology

## 2024-10-16 ENCOUNTER — Ambulatory Visit (HOSPITAL_COMMUNITY)
Admission: RE | Admit: 2024-10-16 | Discharge: 2024-10-16 | Disposition: A | Discharge status: Home / Self Care | Source: Ambulatory Visit | Attending: Cardiology | Admitting: Cardiology

## 2024-10-16 DIAGNOSIS — I252 Old myocardial infarction: Secondary | ICD-10-CM | POA: Diagnosis not present

## 2024-10-16 DIAGNOSIS — D869 Sarcoidosis, unspecified: Secondary | ICD-10-CM | POA: Insufficient documentation

## 2024-10-16 DIAGNOSIS — I2510 Atherosclerotic heart disease of native coronary artery without angina pectoris: Secondary | ICD-10-CM | POA: Diagnosis not present

## 2024-10-16 DIAGNOSIS — I255 Ischemic cardiomyopathy: Secondary | ICD-10-CM | POA: Diagnosis not present

## 2024-10-16 DIAGNOSIS — Z8674 Personal history of sudden cardiac arrest: Secondary | ICD-10-CM | POA: Diagnosis not present

## 2024-10-16 DIAGNOSIS — Z79899 Other long term (current) drug therapy: Secondary | ICD-10-CM | POA: Diagnosis not present

## 2024-10-16 DIAGNOSIS — D8685 Sarcoid myocarditis: Secondary | ICD-10-CM

## 2024-10-16 DIAGNOSIS — I5022 Chronic systolic (congestive) heart failure: Secondary | ICD-10-CM | POA: Insufficient documentation

## 2024-10-16 DIAGNOSIS — Z79624 Long term (current) use of inhibitors of nucleotide synthesis: Secondary | ICD-10-CM | POA: Insufficient documentation

## 2024-10-16 DIAGNOSIS — I340 Nonrheumatic mitral (valve) insufficiency: Secondary | ICD-10-CM | POA: Diagnosis not present

## 2024-10-16 DIAGNOSIS — Z95810 Presence of automatic (implantable) cardiac defibrillator: Secondary | ICD-10-CM | POA: Diagnosis not present

## 2024-10-16 DIAGNOSIS — E669 Obesity, unspecified: Secondary | ICD-10-CM | POA: Insufficient documentation

## 2024-10-16 MED ORDER — METOPROLOL SUCCINATE ER 25 MG PO TB24: ORAL_TABLET | ORAL | 3 refills | Status: AC

## 2024-10-16 MED ORDER — PREDNISONE 10 MG PO TABS: 10.0000 mg | ORAL_TABLET | Freq: Every day | ORAL | 2 refills | Status: AC

## 2024-10-16 NOTE — Patient Instructions (Signed)
 CHANGE Toprol  to 25 mg in the morning and 12.5 mg ( 1/2 Tab) in the evening.  CHANGE Prednisone  to 10 mg daily for 4 weeks. Then go to 5 mg ( 1/2 Tab) daily.  Please follow up with our heart failure pharmacist in 6 weeks.  Your physician recommends that you schedule a follow-up appointment in: 3 months ( February 2026) ** PLEASE CALL THE OFFICE IN [place] TO ARRANGE YOUR FOLLOW UP APPOINTMENT.**  If you have any questions or concerns before your next appointment please send us  a message through [place] or call our office at [phone].    TO LEAVE A MESSAGE FOR THE NURSE SELECT OPTION 2, PLEASE LEAVE A MESSAGE INCLUDING: YOUR NAME DATE OF BIRTH CALL BACK NUMBER REASON FOR CALL**this is important as we prioritize the call backs  YOU WILL RECEIVE A CALL BACK THE SAME DAY AS LONG AS YOU CALL BEFORE 4:00 PM  At the [facility], you and your health needs are our priority. As part of our continuing mission to provide you with exceptional heart care, we have created designated Provider Care Teams. These Care Teams include your primary Cardiologist (physician) and Advanced Practice Providers (APPs- Physician Assistants and Nurse Practitioners) who all work together to provide you with the care you need, when you need it.   You may see any of the following providers on your designated Care Team at your next follow up: Dr Toribio Fuel Dr Ezra Shuck Dr. [name] Greig Mosses, NP Caffie Shed, [place] [facility] [place], [place] Beckey Coe, NP Jordan Lee, NP Ellouise Class, NP Tinnie Redman, PharmD Jaun Bash, PharmD   Please be sure to bring in all your medications bottles to every appointment.    Thank you for choosing [facility]

## 2024-10-16 NOTE — Telephone Encounter (Signed)
 I called and spoke to pt. Pt informed of Dr Lavena note and verbalized understanding. CPAP has been ordered. NFN.   Pt will call back once she receives her machine to schedule a f/u.

## 2024-10-16 NOTE — Telephone Encounter (Signed)
 ATC x1. LVMTCB

## 2024-10-16 NOTE — Addendum Note (Signed)
 Addended by: Ayden Apodaca T on: 10/16/2024 02:58 PM   Modules accepted: Orders

## 2024-10-16 NOTE — Telephone Encounter (Signed)
 Copied from CRM #[id number]. Topic: Clinical - Lab/Test Results >> Oct 16, 2024 10:21 AM Marie Tanner PARAS wrote: Reason for CRM: Patient is returning call to [facility] regarding sleep study results. Please return call to patient.  See other encounter. NFN

## 2024-10-18 ENCOUNTER — Other Ambulatory Visit

## 2024-10-18 DIAGNOSIS — Z006 Encounter for examination for normal comparison and control in clinical research program: Secondary | ICD-10-CM

## 2024-10-30 LAB — GENECONNECT MOLECULAR SCREEN: Genetic Analysis Overall Interpretation: NEGATIVE

## 2024-11-02 ENCOUNTER — Encounter: Payer: Self-pay | Admitting: Internal Medicine

## 2024-11-02 ENCOUNTER — Ambulatory Visit: Attending: Internal Medicine | Admitting: Internal Medicine

## 2024-11-02 VITALS — BP 128/72 | HR 87 | Ht 62.0 in | Wt 206.0 lb

## 2024-11-02 DIAGNOSIS — H6123 Impacted cerumen, bilateral: Secondary | ICD-10-CM | POA: Insufficient documentation

## 2024-11-02 DIAGNOSIS — I472 Ventricular tachycardia, unspecified: Secondary | ICD-10-CM | POA: Diagnosis not present

## 2024-11-02 DIAGNOSIS — R002 Palpitations: Secondary | ICD-10-CM

## 2024-11-02 NOTE — Progress Notes (Signed)
 HPI Marie Tanner returns for followup following ICD insertion. She is a pleasant 68 yo woman with a h/o anterior MI/shock s/p emergent PCI of the LM and LAD after exeriencing a VF arrest almost 10 years ago. 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 She also has a h/o breast CA. She has done well otherwise. No syncope, chest pain or sob. Her EF decreased and she has undergone ICD insertion for primary prevention.  Allergies  Allergen Reactions   Dilaudid [Hydromorphone Hcl] Anaphylaxis, Itching and Swelling   Epinephrine  Anxiety and Other (See Comments)    Severe anxiety   Lactose Intolerance (Gi) Diarrhea   Tilactase     Other Reaction(s): Diarrhea   Jardiance  [Empagliflozin ]     YEAST INFECTION    Shellfish Allergy      Current Outpatient Medications  Medication Sig Dispense Refill   acetaminophen  (TYLENOL ) 500 MG tablet Take 1,000 mg by mouth every 6 (six) hours as needed for moderate pain.     aspirin  EC 81 MG tablet Take 1 tablet (81 mg total) by mouth daily.     blood glucose meter kit and supplies KIT by Does not apply route daily as needed. Dispense based on patient and insurance preference. Use up to four times daily as directed. (FOR ICD-9 250.00, 250.01).     BRILINTA  60 MG TABS tablet TAKE 1 TABLET BY MOUTH 2 TIMES DAILY. 180 tablet 3   calcium -vitamin D (OSCAL WITH D) 500-5 MG-MCG tablet Take 1 tablet by mouth 2 (two) times daily.     clotrimazole -betamethasone  (LOTRISONE ) cream Apply to feet and between toes twice daily for 6 weeks. 45 g 2   Dulaglutide  (TRULICITY ) 4.5 MG/0.5ML SOPN Inject 4.5 mg as directed once a week. 2 mL 11   Evolocumab  (REPATHA  SURECLICK) 140 MG/ML SOAJ INJECT 140 MG INTO THE SKIN EVERY 14 (FOURTEEN) DAYS. 6 mL 3   fluticasone  (FLONASE ) 50 MCG/ACT nasal spray Place 1 spray into both nostrils as needed for allergies.     furosemide  (LASIX ) 40 MG tablet TAKE 1 TABLET ONCE PER WEEK WITH ADDITIONAL TABLET AS  NEEDED FOR SHORTNESS OF BREATH OR WEIGHT GAIN OF 2 POUNDS IN 24 HOURS OR 5 POUNDS IN 1 WEEK 30 tablet 3   gabapentin (NEURONTIN) 300 MG capsule Take 300 mg by mouth at bedtime.     glucose blood test strip Use as instructed. E11.5. To check blood glucose daily. 100 each 12   ketoconazole  (NIZORAL ) 2 % cream Apply to both feet and between toes once daily. 60 g 1   magnesium  oxide (MAG-OX) 400 MG tablet TAKE 1 TABLET BY MOUTH EVERY DAY 90 tablet 0   metoprolol  succinate (TOPROL -XL) 25 MG 24 hr tablet Take 1 tablet (25 mg total) by mouth in the morning AND 0.5 tablets (12.5 mg total) every evening. Take with or immediately following a meal. 120 tablet 3   mycophenolate  (CELLCEPT ) 500 MG tablet TAKE 3 TABLETS (1500 MG) BY MOUTH TWICE A DAY 540 tablet 1   nitroGLYCERIN  (NITROSTAT ) 0.4 MG SL tablet Place 1 tablet (0.4 mg total) under the tongue every 5 (five) minutes as needed for chest pain. max of 3 if no relief call 911 25 tablet PRN   pantoprazole  (PROTONIX ) 40 MG tablet TAKE 1 TABLET BY MOUTH EVERY DAY 90 tablet 1   predniSONE  (DELTASONE ) 10 MG tablet Take 1 tablet (10 mg total) by mouth daily with breakfast. 30  tablet 2   rosuvastatin  (CRESTOR ) 20 MG tablet Take 1 tablet by mouth 3 times per week 40 tablet 5   sacubitril -valsartan  (ENTRESTO ) 97-103 MG Take 1 tablet by mouth 2 (two) times daily. 180 tablet 3   spironolactone  (ALDACTONE ) 25 MG tablet Take 1 tablet (25 mg total) by mouth daily. 30 tablet 11   terbinafine  (LAMISIL ) 250 MG tablet Take 1 tablet (250 mg total) by mouth daily. 30 tablet 0   triamcinolone  cream (KENALOG ) 0.1 % Apply 1 Application topically daily.     No current facility-administered medications for this visit.     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   GERD (gastroesophageal reflux disease)    HCAP (healthcare-associated pneumonia) 11/22/2014   Heart murmur    High cholesterol    History of blood transfusion 09/2014   related to MI   History of gout    Hypertension    Liver laceration 10/19/2014   Palpitations 12/12/2020   Pulmonary edema 10/19/2014   Renal disorder    Renal disorder    Right hip pain 04/03/2021   Sleep apnea    suppose to wear mask but I don't (11/22/2014)   STEMI (ST elevation myocardial infarction) (HCC) 10/19/2014   Type II diabetes mellitus (HCC)    Urticaria     ROS:   All systems reviewed and negative except as noted in the HPI.   Past Surgical History:  Procedure Laterality Date   ABDOMINAL HYSTERECTOMY  ~ 2000   APPLICATION OF WOUND VAC  10/2014   over my naval (11/22/2014)   BREAST LUMPECTOMY Right 2017   BREAST LUMPECTOMY WITH AXILLARY LYMPH NODE BIOPSY     BREAST SURGERY     CESAREAN SECTION  1980; 1982   CORONARY ANGIOPLASTY WITH STENT PLACEMENT  09/2014   multiple/notes 11/22/2014   EXTRACORPOREAL CIRCULATION     Femerol artery repair Right 09/2014   pt insists it was a right femoral artery repair on 11/22/2014   ICD IMPLANT N/A 07/27/2024   Procedure: ICD IMPLANT;  Surgeon: Waddell Danelle ORN, MD;  Location: [facility] INVASIVE CV LAB;  Service: Cardiovascular;  Laterality: N/A;   LAPAROSCOPIC ABDOMINAL EXPLORATION     LEFT HEART CATH AND CORONARY ANGIOGRAPHY N/A 12/07/2017   Procedure: LEFT HEART CATH AND CORONARY ANGIOGRAPHY;  Surgeon: Anner Alm ORN, MD;  Location: [facility] INVASIVE CV LAB;  Service: Cardiovascular;  Laterality: N/A;   LEFT VENTRICULAR ASSIST DEVICE     pt is not aware of this hx on 11/22/2014   Liver laceration repair  09/2014   related to CPR/notes 11/22/2014   RIGHT/LEFT HEART CATH AND CORONARY ANGIOGRAPHY N/A 04/21/2021   Procedure: RIGHT/LEFT HEART CATH AND CORONARY ANGIOGRAPHY;  Surgeon: Mady Bruckner, MD;  Location: [facility]  INVASIVE CV LAB;  Service: Cardiovascular;  Laterality: N/A;   TEE WITHOUT CARDIOVERSION N/A 03/13/2021   Procedure: TRANSESOPHAGEAL ECHOCARDIOGRAM (TEE);  Surgeon: Loni Soyla LABOR, MD;  Location: [facility] ENDOSCOPY;  Service: Cardiology;  Laterality: N/A;   TRACHEOSTOMY  09/2014   closed on it's own when they took it out   TUBAL LIGATION  1982     Family History  Problem Relation Age of Onset   Diabetes Mother    Hypertension Mother  COPD Mother    Heart disease Father    Heart attack Father    Alzheimer's disease Father    Diabetes Brother    Asthma Daughter    Stroke Neg Hx    Colon cancer Neg Hx    Stomach cancer Neg Hx    Pancreatic cancer Neg Hx    AAA (abdominal aortic aneurysm) Neg Hx      Social History   Socioeconomic History   Marital status: Divorced    Spouse name: Not on file   Number of children: 2   Years of education: Not on file   Highest education level: Not on file  Occupational History   Not on file  Tobacco Use   Smoking status: Never   Smokeless tobacco: Never  Vaping Use   Vaping status: Never Used  Substance and Sexual Activity   Alcohol use: Never   Drug use: No   Sexual activity: Not Currently  Other Topics Concern   Not on file  Social History Narrative   Not on file   Social Drivers of Health   Financial Resource Strain: Not on File (03/19/2022)   Received from [employer]    Financial Resource Strain: 0  Food Insecurity: No Food Insecurity (08/10/2024)   Hunger Vital Sign    Worried About Running Out of Food in the Last Year: Never true    Ran Out of Food in the Last Year: Never true  Transportation Needs: No Transportation Needs (08/10/2024)   PRAPARE - Administrator, Civil Service (Medical): No    Lack of Transportation (Non-Medical): No  Physical Activity: Not on File (03/19/2022)   Received from [facility]   Physical Activity    Physical Activity: 0  Stress: Not on File (03/19/2022)    Received from [facility]   Stress    Stress: 0  Social Connections: Not on File (08/04/2023)   Received from [facility]   Social Connections    Connectedness: 0  Intimate Partner Violence: Not At Risk (08/10/2024)   Humiliation, Afraid, Rape, and Kick questionnaire    Fear of Current or Ex-Partner: No    Emotionally Abused: No    Physically Abused: No    Sexually Abused: No     BP 128/72   Pulse 87   Ht 5' 2 (1.575 m)   Wt 206 lb (93.4 kg)   SpO2 100%   BMI 37.68 kg/m   Physical Exam:  Well appearing NAD HEENT: Unremarkable Neck:  No JVD, no thyromegally Lymphatics:  No adenopathy Back:  No CVA tenderness Lungs:  Clear with no wheezes HEART:  Regular rate rhythm, no murmurs, no rubs, no clicks Abd:  soft, positive bowel sounds, no organomegally, no rebound, no guarding Ext:  2 plus pulses, no edema, no cyanosis, no clubbing Skin:  No rashes no nodules Neuro:  CN II through XII intact, motor grossly intact  EKG -NSR with anterior and inferior MI  DEVICE  Normal device function.  See PaceArt for details.   Assess/Plan:  ICM - she is s/p MI and her EF is 30%. She is on maximal GDMT under Dr. Sharol direction.she will continue her current meds. ICD - her medtronic device is working normally. We will follow.   Danelle Broden Holt,MD

## 2024-11-02 NOTE — Patient Instructions (Signed)

## 2024-11-03 LAB — CUP PACEART INCLINIC DEVICE CHECK
Date Time Interrogation Session: 20251204000000
HighPow Impedance: 72 Ohm
Implantable Lead Connection Status: 753985
Implantable Lead Implant Date: 20250828
Implantable Lead Location: 753860
Implantable Lead Model: 137
Implantable Lead Serial Number: 302079
Implantable Pulse Generator Implant Date: 20250828
Lead Channel Impedance Value: 830 Ohm
Lead Channel Pacing Threshold Amplitude: 0.7 V
Lead Channel Pacing Threshold Pulse Width: 0.4 ms
Lead Channel Sensing Intrinsic Amplitude: 25 mV
Lead Channel Setting Pacing Amplitude: 2.5 V
Lead Channel Setting Pacing Pulse Width: 0.4 ms
Lead Channel Setting Sensing Sensitivity: 0.6 mV
Pulse Gen Serial Number: 224974
Zone Setting Status: 755011

## 2024-11-06 ENCOUNTER — Ambulatory Visit: Admitting: Podiatry

## 2024-11-16 DIAGNOSIS — H9193 Unspecified hearing loss, bilateral: Secondary | ICD-10-CM | POA: Insufficient documentation

## 2024-11-16 DIAGNOSIS — H93A2 Pulsatile tinnitus, left ear: Secondary | ICD-10-CM | POA: Insufficient documentation

## 2024-11-16 DIAGNOSIS — H6122 Impacted cerumen, left ear: Secondary | ICD-10-CM | POA: Insufficient documentation

## 2024-11-27 ENCOUNTER — Encounter: Payer: Self-pay | Admitting: Podiatry

## 2024-11-27 ENCOUNTER — Ambulatory Visit (INDEPENDENT_AMBULATORY_CARE_PROVIDER_SITE_OTHER): Admitting: Podiatry

## 2024-11-27 DIAGNOSIS — B353 Tinea pedis: Secondary | ICD-10-CM | POA: Diagnosis not present

## 2024-11-27 MED ORDER — TERBINAFINE HCL 250 MG PO TABS: 250.0000 mg | ORAL_TABLET | Freq: Every day | ORAL | 0 refills | Status: AC

## 2024-11-27 NOTE — Progress Notes (Signed)
 "   Chief Complaint  Patient presents with   Tinea Pedis    4 wk f/u tinea pedis bilateral foot. Minimal itching. Taking Lamisil  and using lotramin and kenalog  cream.     HPI: 68 y.o. female presents today for follow-up of bilateral tinea pedis.  She did finish the 30-day course of the oral terbinafine  and is using the Lotrisone  cream.  Denies any side effects or adverse reactions from the oral terbinafine .  Feels at least 80% better at this time and is very pleased.  Has occasional itching.  She is not 100% resolved yet.  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   GERD (gastroesophageal reflux disease)    HCAP (healthcare-associated pneumonia) 11/22/2014   Heart murmur    High cholesterol    History of blood transfusion 09/2014   related to MI   History of gout    Hypertension    Liver laceration 10/19/2014   Palpitations 12/12/2020   Pulmonary edema 10/19/2014   Renal disorder    Renal disorder    Right hip pain 04/03/2021   Sleep apnea    suppose to wear mask but I don't (11/22/2014)   STEMI (ST elevation myocardial infarction) (HCC) 10/19/2014   Type II diabetes mellitus (HCC)    Urticaria    Past Surgical History:  Procedure Laterality Date   ABDOMINAL HYSTERECTOMY  ~ 2000   APPLICATION OF WOUND VAC  10/2014   over my naval (11/22/2014)   BREAST LUMPECTOMY Right 2017   BREAST LUMPECTOMY WITH AXILLARY LYMPH NODE BIOPSY     BREAST SURGERY     CESAREAN SECTION  1980; 1982   CORONARY ANGIOPLASTY WITH STENT PLACEMENT  09/2014   multiple/notes 11/22/2014   EXTRACORPOREAL CIRCULATION     Femerol artery repair Right 09/2014   pt insists it was a right femoral artery repair on 11/22/2014   ICD IMPLANT N/A 07/27/2024    Procedure: ICD IMPLANT;  Surgeon: Waddell Danelle ORN, MD;  Location: [facility] INVASIVE CV LAB;  Service: Cardiovascular;  Laterality: N/A;   LAPAROSCOPIC ABDOMINAL EXPLORATION     LEFT HEART CATH AND CORONARY ANGIOGRAPHY N/A 12/07/2017   Procedure: LEFT HEART CATH AND CORONARY ANGIOGRAPHY;  Surgeon: Anner Alm ORN, MD;  Location: [facility] INVASIVE CV LAB;  Service: Cardiovascular;  Laterality: N/A;   LEFT VENTRICULAR ASSIST DEVICE     pt is not aware of this hx on 11/22/2014   Liver laceration repair  09/2014   related to CPR/notes 11/22/2014   RIGHT/LEFT HEART CATH AND CORONARY ANGIOGRAPHY N/A 04/21/2021   Procedure: RIGHT/LEFT HEART CATH AND CORONARY ANGIOGRAPHY;  Surgeon: Mady Bruckner, MD;  Location: [facility] INVASIVE CV LAB;  Service: Cardiovascular;  Laterality: N/A;   TEE WITHOUT CARDIOVERSION N/A 03/13/2021   Procedure: TRANSESOPHAGEAL ECHOCARDIOGRAM (TEE);  Surgeon: Loni Soyla LABOR, MD;  Location[facility] ENDOSCOPY;  Service: Cardiology;  Laterality: N/A;   TRACHEOSTOMY  09/2014   closed on it's own when they took it out   TUBAL LIGATION  1982   Allergies[1]   Physical Exam: Palpable pedal pulses.  Mild generalized edema to the lower legs and feet.  Interspaces are clear.  Mild fungal involvement of the toenails.  There still is scaling and peeling of the skin  on the plantar aspect of both feet in a moccasin type distribution.  Overall though, it is much improved han previous visit.  Assessment/Plan of Care: 1. Tinea pedis of both feet      Meds ordered this encounter  Medications   terbinafine  (LAMISIL ) 250 MG tablet    Sig: Take 1 tablet (250 mg total) by mouth daily.    Dispense:  30 tablet    Refill:  0   I discussed findings with the patient today.  Will go ahead and get her back on another 30-day course of oral terbinafine  to work on the continued improvement of the skin.  She will continue with the Lotrisone  cream and call if she needs any refills sent in.  Will recheck in  approximately 6 to 8 weeks to see how she is improving.   Awanda CHARM Imperial, DPM, FACFAS [facility]     [address], [place] [address]                Office [phone]  Fax [phone]    [1]  Allergies Allergen Reactions   Dilaudid [Hydromorphone Hcl] Anaphylaxis, Itching and Swelling   Epinephrine  Anxiety and Other (See Comments)    Severe anxiety   Lactose Intolerance (Gi) Diarrhea   Tilactase     Other Reaction(s): Diarrhea   Jardiance  [Empagliflozin ]     YEAST INFECTION    Shellfish Allergy    "

## 2024-11-28 ENCOUNTER — Other Ambulatory Visit: Payer: Self-pay | Admitting: Adult Health

## 2024-11-28 DIAGNOSIS — C50911 Malignant neoplasm of unspecified site of right female breast: Secondary | ICD-10-CM

## 2024-11-28 DIAGNOSIS — C7981 Secondary malignant neoplasm of breast: Secondary | ICD-10-CM

## 2024-11-28 DIAGNOSIS — R978 Other abnormal tumor markers: Secondary | ICD-10-CM

## 2024-11-28 DIAGNOSIS — C50411 Malignant neoplasm of upper-outer quadrant of right female breast: Secondary | ICD-10-CM

## 2024-11-28 NOTE — Progress Notes (Signed)
 Reviewed positive guardant reveal testing results with patient.  I reviewed that since her imaging in September was negative in light of her results and her guardant reveal results are more elevated that I spoke with Dr. Loretha who recommends doing a PET scan.  Patient is in agreement with this and orders for the PET scan were placed.  Morna Kendall, NP 11/28/2024 4:43 PM Medical Oncology and Hematology [facility] [address] [place], [place] [address] Tel. [phone]    Fax. [phone]

## 2024-11-28 NOTE — Progress Notes (Incomplete)
***  In Progress***    Advanced Heart Failure Clinic Note   HPI:  68 yo F with a cardiac history dating to 2015 when she had a STEMI with cardiac arrest at [facility]. PCI to the left main/LAD and Lcx, prolonged hospitalization at that time.    She underwent repeat left heart cath in January 2019 that demonstrated patent stents which were heavily calcified with other areas of nonobstructive CAD. EF at that time was 35 to 45%. With increasing GDMT she had improvement in EF by late 2019 to 40 to 50%. Due to moderate to severe eccentric mitral regurgitation she had a TEE in 2022 and repeat right and left heart cath with structural evaluation in May 2022 that showed patent left main and LAD stents normal filling pressures and a normal transmitral gradient. She was deferred for medical management only.   She had a cardiac MRI with noncoronary distribution of LGE followed by a cardiac PET that showed FDG uptake consistent with active inflammation/sarcoid. Patient now undergoing treatment for cardiac sarcoid.   Today she returns to HF clinic for pharmacist medication titration. At last visit with MD on 11/17, she reported doing well but had ongoing issues with palpitations that started after the ICD was placed. Metoprolol  was increased to 25 mg AM and 12.5 mg PM and prednisone  was weaned to 10 mg daily, then 5 mg daily 4 weeks after that. She had follow up with EP on 12/4. Device was functioning normally.   Shortness of breath/dyspnea on exertion? {YES NO:22349}  Orthopnea/PND? {YES NO:22349} Edema? {YES NO:22349} Lightheadedness/dizziness? {YES NO:22349} Daily weights at home? {YES NO:22349} Blood pressure/heart rate monitoring at home? {YES [id number] Following low-sodium/fluid-restricted diet? {YES NO:22349}  HF Medications: Metoprolol  XL 25 mg qAM and 12.5 mg qPM Entresto  97/103 mg BID Spironolactone  25 mg daily Furosemide  40 mg once weekly with additional 40 mg PRN Mycophenolate  1500 mg  BID Prednisone  5 mg daily*** Calcium  with vitamin D  Has the patient been experiencing any side effects to the medications prescribed?  {YES NO:22349}  Does the patient have any problems obtaining medications due to transportation or finances?   {YES NO:22349}  Understanding of regimen: {excellent/good/fair/poor:19665} Understanding of indications: {excellent/good/fair/poor:19665} Potential of compliance: {excellent/good/fair/poor:19665} Patient understands to avoid NSAIDs. Patient understands to avoid decongestants.    Pertinent Lab Values: 09/18/24: Serum creatinine 0.85, BUN 11, Potassium 4.8, Sodium 136  Vital Signs: Weight: *** (last MD clinic weight: 204 lbs) Blood pressure: ***  Heart rate: ***   Assessment/Plan: 1.Chronic systolic heart failure: Predominant ischemic cardiomyopathy, prior STEMI in 2015. Also component of cardiac sarcoid with improvement on treatment. - *** metoprolol  succinate to 25mg  AM/12.5mg  PM - Continue Entresto  97/103 mg BID - Continue spironolactone  25 mg daily - No SGLT2 with prior yeast infection - S/p ICD 04/2024 - NYHA class II - Wean prednisone  to 10mg  today. In 4 weeks can wean to 5mg  - Pharmacy follow up in 6 weeks, can hopefully wean off and order PET at that visit   2.Cardiac sarcoid: - Vaccinations confirmed - PET improving as noted above - Continue cellcept  1500mg  BID - Off bactrim  - Prednisone  *** - Reorder PET***   3.Obesity:  -  Sleep study positive, CPAP titration - Consider Zepbound at pharmacy follow up   4.Moderate MR:  - Stable on imaging  Follow up ***   Duwaine Plant, PharmD, BCPS Heart Failure Clinic Pharmacist [phone]

## 2024-11-29 ENCOUNTER — Ambulatory Visit (HOSPITAL_COMMUNITY)
Admission: RE | Admit: 2024-11-29 | Discharge: 2024-11-29 | Disposition: A | Discharge status: Home / Self Care | Source: Ambulatory Visit | Attending: Cardiology | Admitting: Cardiology

## 2024-11-29 ENCOUNTER — Ambulatory Visit (HOSPITAL_COMMUNITY): Payer: Self-pay | Admitting: Cardiology

## 2024-11-29 VITALS — BP 132/64 | HR 80 | Wt 204.0 lb

## 2024-11-29 DIAGNOSIS — I340 Nonrheumatic mitral (valve) insufficiency: Secondary | ICD-10-CM | POA: Insufficient documentation

## 2024-11-29 DIAGNOSIS — I5042 Chronic combined systolic (congestive) and diastolic (congestive) heart failure: Secondary | ICD-10-CM | POA: Insufficient documentation

## 2024-11-29 DIAGNOSIS — I25118 Atherosclerotic heart disease of native coronary artery with other forms of angina pectoris: Secondary | ICD-10-CM | POA: Diagnosis not present

## 2024-11-29 DIAGNOSIS — R0602 Shortness of breath: Secondary | ICD-10-CM | POA: Insufficient documentation

## 2024-11-29 DIAGNOSIS — R5383 Other fatigue: Secondary | ICD-10-CM | POA: Diagnosis not present

## 2024-11-29 LAB — BASIC METABOLIC PANEL WITH GFR
Anion gap: 10 (ref 5–15)
BUN: 12 mg/dL (ref 8–23)
CO2: 26 mmol/L (ref 22–32)
Calcium: 9.5 mg/dL (ref 8.9–10.3)
Chloride: 105 mmol/L (ref 98–111)
Creatinine, Ser: 0.73 mg/dL (ref 0.44–1.00)
GFR, Estimated: >60 mL/min
Glucose, Bld: 80 mg/dL (ref 70–99)
Potassium: 4.2 mmol/L (ref 3.5–5.1)
Sodium: 141 mmol/L (ref 135–145)

## 2024-11-29 MED ORDER — FUROSEMIDE 40 MG PO TABS: ORAL_TABLET | ORAL | 3 refills | Status: DC

## 2024-11-29 NOTE — Progress Notes (Signed)
"   ReDS Vest / Clip - 11/29/24 1300       ReDS Vest / Clip   Station Marker B    Ruler Value 33    ReDS Value Range Low volume    ReDS Actual Value 37          "

## 2024-11-29 NOTE — Patient Instructions (Addendum)
 It was a pleasure seeing you today!  MEDICATIONS: -We are changing your medications today -Take your furosemide  40 mg twice daily for 2 days -Call if you have questions about your medications.  LABS: -We will call you if your labs need attention.  NEXT APPOINTMENT: Return to clinic in about one month with MD. Will call you for scheduling PET.  In general, to take care of your heart failure: -Limit your fluid intake to 2 Liters (half-gallon) per day.   -Limit your salt intake to ideally 2-3 grams (2000-3000 mg) per day. -Weigh yourself daily and record, and bring that weight diary to your next appointment.  (Weight gain of 2-3 pounds in 1 day typically means fluid weight.) -The medications for your heart are to help your heart and help you live longer.   -Please contact us  before stopping any of your heart medications.  Call the clinic at [phone] with questions or to reschedule future appointments.

## 2024-12-05 ENCOUNTER — Encounter: Payer: Self-pay | Admitting: Hematology and Oncology

## 2024-12-07 ENCOUNTER — Ambulatory Visit (HOSPITAL_COMMUNITY)
Admission: RE | Admit: 2024-12-07 | Discharge: 2024-12-07 | Disposition: A | Source: Ambulatory Visit | Attending: Adult Health | Admitting: Adult Health

## 2024-12-07 DIAGNOSIS — R978 Other abnormal tumor markers: Secondary | ICD-10-CM | POA: Diagnosis present

## 2024-12-07 DIAGNOSIS — C50411 Malignant neoplasm of upper-outer quadrant of right female breast: Secondary | ICD-10-CM | POA: Diagnosis present

## 2024-12-07 DIAGNOSIS — C7981 Secondary malignant neoplasm of breast: Secondary | ICD-10-CM | POA: Diagnosis present

## 2024-12-07 DIAGNOSIS — C50911 Malignant neoplasm of unspecified site of right female breast: Secondary | ICD-10-CM | POA: Diagnosis present

## 2024-12-07 DIAGNOSIS — Z170 Estrogen receptor positive status [ER+]: Secondary | ICD-10-CM | POA: Insufficient documentation

## 2024-12-07 LAB — GLUCOSE, CAPILLARY: Glucose-Capillary: 113 mg/dL — ABNORMAL HIGH (ref 70–99)

## 2024-12-07 MED ORDER — FLUDEOXYGLUCOSE F - 18 (FDG) INJECTION
10.1000 | Freq: Once | INTRAVENOUS | Status: AC
  Administered 2024-12-07: 10.15 via INTRAVENOUS

## 2024-12-08 ENCOUNTER — Encounter: Payer: Self-pay | Admitting: Hematology and Oncology

## 2024-12-15 ENCOUNTER — Other Ambulatory Visit: Payer: Self-pay | Admitting: Adult Health

## 2024-12-15 ENCOUNTER — Ambulatory Visit

## 2024-12-15 DIAGNOSIS — I472 Ventricular tachycardia, unspecified: Secondary | ICD-10-CM | POA: Diagnosis not present

## 2024-12-15 DIAGNOSIS — C50411 Malignant neoplasm of upper-outer quadrant of right female breast: Secondary | ICD-10-CM

## 2024-12-15 DIAGNOSIS — N6331 Unspecified lump in axillary tail of the right breast: Secondary | ICD-10-CM

## 2024-12-15 MED ORDER — TRAMADOL HCL 50 MG PO TABS: 50.0000 mg | ORAL_TABLET | Freq: Two times a day (BID) | ORAL | 0 refills | Status: AC | PRN

## 2024-12-15 MED ORDER — NAPROXEN 500 MG PO TABS: 500.0000 mg | ORAL_TABLET | Freq: Two times a day (BID) | ORAL | 0 refills | Status: AC

## 2024-12-18 ENCOUNTER — Other Ambulatory Visit (HOSPITAL_BASED_OUTPATIENT_CLINIC_OR_DEPARTMENT_OTHER): Payer: Self-pay

## 2024-12-18 ENCOUNTER — Other Ambulatory Visit: Payer: Self-pay | Admitting: Adult Health

## 2024-12-18 ENCOUNTER — Encounter (HOSPITAL_BASED_OUTPATIENT_CLINIC_OR_DEPARTMENT_OTHER): Payer: Self-pay | Admitting: Cardiovascular Disease

## 2024-12-18 ENCOUNTER — Other Ambulatory Visit: Payer: Self-pay

## 2024-12-18 LAB — CUP PACEART REMOTE DEVICE CHECK
Battery Remaining Longevity: 180 mo
Battery Remaining Percentage: 100 %
Brady Statistic RV Percent Paced: 0 %
Date Time Interrogation Session: 20260116020200
HighPow Impedance: 72 Ohm
Implantable Lead Connection Status: 753985
Implantable Lead Implant Date: 20250828
Implantable Lead Location: 753860
Implantable Lead Model: 137
Implantable Lead Serial Number: 302079
Implantable Pulse Generator Implant Date: 20250828
Lead Channel Impedance Value: 923 Ohm
Lead Channel Pacing Threshold Amplitude: 0.7 V
Lead Channel Pacing Threshold Pulse Width: 0.4 ms
Lead Channel Setting Pacing Amplitude: 2.5 V
Lead Channel Setting Pacing Pulse Width: 0.4 ms
Lead Channel Setting Sensing Sensitivity: 0.6 mV
Pulse Gen Serial Number: 224974
Zone Setting Status: 755011

## 2024-12-18 MED ORDER — MYCOPHENOLATE MOFETIL 500 MG PO TABS
1500.0000 mg | ORAL_TABLET | Freq: Two times a day (BID) | ORAL | 1 refills | Status: AC
  Filled 2024-12-18: qty 540, 90d supply, fill #0

## 2024-12-18 NOTE — Telephone Encounter (Signed)
 Per Dr Zenaida 10/16/2024 Continue cellcept  1500 BID   Refills sent

## 2024-12-19 ENCOUNTER — Ambulatory Visit: Payer: Self-pay | Admitting: Cardiology

## 2024-12-19 ENCOUNTER — Ambulatory Visit
Admission: RE | Admit: 2024-12-19 | Discharge: 2024-12-19 | Disposition: A | Source: Ambulatory Visit | Attending: Adult Health | Admitting: Adult Health

## 2024-12-19 ENCOUNTER — Inpatient Hospital Stay: Attending: Hematology and Oncology | Admitting: Adult Health

## 2024-12-19 ENCOUNTER — Encounter: Payer: Self-pay | Admitting: Adult Health

## 2024-12-19 ENCOUNTER — Other Ambulatory Visit: Payer: Self-pay | Admitting: Adult Health

## 2024-12-19 ENCOUNTER — Ambulatory Visit (HOSPITAL_COMMUNITY)
Admission: RE | Admit: 2024-12-19 | Discharge: 2024-12-19 | Disposition: A | Discharge status: Home / Self Care | Source: Ambulatory Visit | Attending: Adult Health | Admitting: Adult Health

## 2024-12-19 VITALS — BP 140/52 | HR 81 | Temp 97.7°F | Resp 16 | Wt 201.0 lb

## 2024-12-19 DIAGNOSIS — C50411 Malignant neoplasm of upper-outer quadrant of right female breast: Secondary | ICD-10-CM | POA: Insufficient documentation

## 2024-12-19 DIAGNOSIS — Z170 Estrogen receptor positive status [ER+]: Secondary | ICD-10-CM

## 2024-12-19 DIAGNOSIS — N6331 Unspecified lump in axillary tail of the right breast: Secondary | ICD-10-CM

## 2024-12-19 DIAGNOSIS — M25511 Pain in right shoulder: Secondary | ICD-10-CM | POA: Insufficient documentation

## 2024-12-19 HISTORY — PX: BREAST BIOPSY: SHX20

## 2024-12-19 MED ORDER — ANASTROZOLE 1 MG PO TABS: 1.0000 mg | ORAL_TABLET | Freq: Every day | ORAL | 1 refills | Status: AC

## 2024-12-19 NOTE — Progress Notes (Unsigned)
 [facility] Cancer Follow up:    Marie Rojelio Caldron, NP [address] [place] [address]   DIAGNOSIS: Cancer Staging  No matching staging information was found for the patient.    SUMMARY OF ONCOLOGIC HISTORY: Oncology History  Malignant neoplasm of upper-outer quadrant of right breast in female, estrogen receptor positive (HCC)  01/2016 Initial Diagnosis   Treated at [facility]: Lumpectomy: Stage IIa T1c N1a M0 IDC ER 100%, PR 0%, HER2 negative   2017 - 2017 Chemotherapy   4 cycles of Taxotere and Cytoxan   2017 - 2017 Radiation Therapy   Adjuvant radiation therapy   2017 -  Anti-estrogen oral therapy   Letrozole switched to exemestane     CURRENT THERAPY:  INTERVAL HISTORY:  Discussed the use of AI scribe software for clinical note transcription with the patient, who gave verbal consent to proceed.  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) 04/06/2022   Type 2 diabetes mellitus with hyperglycemia, without long-term current use of insulin  (HCC) 04/06/2022   VT (ventricular tachycardia) (HCC) 04/06/2022   Class 2 severe obesity due to excess calories with serious comorbidity and body mass index (BMI) of 36.0 to 36.9 in adult 04/06/2022   Right hip pain 04/03/2021   Palpitations 12/12/2020   Allergic urticaria 10/31/2019   Dermographia 10/31/2019   Other allergic rhinitis 10/31/2019   Current use of beta blocker 10/31/2019   Allergy to insect bites 10/31/2019   Hx of colonic polyp 01/03/2018   Angina, class III 12/07/2017   Abnormal nuclear stress test 12/07/2017   Type II diabetes mellitus (HCC)    Sleep apnea    Renal disorder    History of gout    Pure  hypercholesterolemia    Heart murmur    GERD (gastroesophageal reflux disease)    CAD S/P percutaneous coronary angioplasty    Cancer (HCC)    Malignant neoplasm of upper-outer quadrant of right breast in female, estrogen receptor positive (HCC)    Hyperlipidemia LDL goal <70 11/16/2016   History of breast cancer 01/24/2016   Abnormal finding on mammography 01/17/2016   Chest pain 06/06/2015   Diarrhea 06/06/2015   Essential hypertension 06/06/2015   Depression    Encounter for therapeutic drug monitoring 02/19/2015   Mitral regurgitation 01/30/2015   Ischemic heart disease due to coronary artery obstruction 01/30/2015   Chronic combined systolic and diastolic heart failure (HCC) 01/30/2015   Dyspnea 11/24/2014   Cardiomyopathy (HCC) 11/24/2014   DVT (deep venous thrombosis) (HCC) 11/24/2014   DM type 2 (diabetes mellitus, type 2) (HCC) 11/24/2014   HCAP (healthcare-associated pneumonia) 11/22/2014   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) 11/04/2014   Controlled type 2 diabetes mellitus without complication (HCC) 10/31/2014   High sodium levels 10/29/2014   Elevated WBC count 10/29/2014   Acute confusion due to medical condition 10/27/2014   Insomnia due to medical condition 10/27/2014   Acute respiratory failure (HCC) 10/24/2014   Injury of kidney 10/20/2014   Laceration of liver 10/20/2014   STEMI (ST elevation myocardial infarction) (HCC) 10/19/2014   Pulmonary edema 10/19/2014   Liver laceration 10/19/2014   History of blood transfusion 09/30/2014   Diabetes mellitus (HCC)  04/19/2013    is allergic to dilaudid [hydromorphone hcl], epinephrine , lactose intolerance (gi), tilactase, jardiance  [empagliflozin ], and shellfish allergy.  MEDICAL HISTORY: Past Medical History:  Diagnosis Date   Breast cancer (HCC)    Cancer (HCC)    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   Chronic combined systolic and diastolic heart failure (HCC) 01/30/2015   Depression    DVT  (deep venous thrombosis) (HCC) 09/2014   left femerol artery injury during resusciation.   GERD (gastroesophageal reflux disease)    HCAP (healthcare-associated pneumonia) 11/22/2014   Heart murmur    High cholesterol    History of blood transfusion 09/2014   related to MI   History of gout    Hypertension    Liver laceration 10/19/2014   Palpitations 12/12/2020   Pulmonary edema 10/19/2014   Renal disorder    Renal disorder    Right hip pain 04/03/2021   Sleep apnea    suppose to wear mask but I don't (11/22/2014)   STEMI (ST elevation myocardial infarction) (HCC) 10/19/2014   Type II diabetes mellitus (HCC)    Urticaria     SURGICAL HISTORY: Past Surgical History:  Procedure Laterality Date   ABDOMINAL HYSTERECTOMY  ~ 2000   APPLICATION OF WOUND VAC  10/2014   over my naval (11/22/2014)   BREAST LUMPECTOMY Right 2017   BREAST LUMPECTOMY WITH AXILLARY LYMPH NODE BIOPSY     BREAST SURGERY     CESAREAN SECTION  1980; 1982   CORONARY ANGIOPLASTY WITH STENT PLACEMENT  09/2014   multiple/notes 11/22/2014   EXTRACORPOREAL CIRCULATION     Femerol artery repair Right 09/2014   pt insists it was a right femoral artery repair on 11/22/2014   ICD IMPLANT N/A 07/27/2024   Procedure: ICD IMPLANT;  Surgeon: Waddell Danelle ORN, MD;  Location: [facility] INVASIVE CV LAB;  Service: Cardiovascular;  Laterality: N/A;   LAPAROSCOPIC ABDOMINAL EXPLORATION     LEFT HEART CATH AND CORONARY ANGIOGRAPHY N/A 12/07/2017   Procedure: LEFT HEART CATH AND CORONARY ANGIOGRAPHY;  Surgeon: Anner Alm ORN, MD;  Location: [facility] INVASIVE CV LAB;  Service: Cardiovascular;  Laterality: N/A;   LEFT VENTRICULAR ASSIST DEVICE     pt is not aware of this hx on 11/22/2014   Liver laceration repair  09/2014   related to CPR/notes 11/22/2014   RIGHT/LEFT HEART CATH AND CORONARY ANGIOGRAPHY N/A 04/21/2021   Procedure: RIGHT/LEFT HEART CATH AND CORONARY ANGIOGRAPHY;  Surgeon: Mady Bruckner, MD;  Location: [facility] INVASIVE  CV LAB;  Service: Cardiovascular;  Laterality: N/A;   TEE WITHOUT CARDIOVERSION N/A 03/13/2021   Procedure: TRANSESOPHAGEAL ECHOCARDIOGRAM (TEE);  Surgeon: Loni Soyla LABOR, MD;  Location: [facility] ENDOSCOPY;  Service: Cardiology;  Laterality: N/A;   TRACHEOSTOMY  09/2014   closed on it's own when they took it out   TUBAL LIGATION  1982    SOCIAL HISTORY: Social History   Socioeconomic History   Marital status: Divorced    Spouse name: Not on file   Number of children: 2   Years of education: Not on file   Highest education level: Not on file  Occupational History   Not on file  Tobacco Use   Smoking status: Never   Smokeless tobacco: Never  Vaping Use   Vaping status: Never Used  Substance and Sexual Activity   Alcohol use: Never   Drug use: No   Sexual activity: Not Currently  Other Topics Concern   Not on file  Social History Narrative   Not on file  Social Drivers of Health   Tobacco Use: Low Risk (11/27/2024)   Patient History    Smoking Tobacco Use: Never    Smokeless Tobacco Use: Never    Passive Exposure: Not on file  Financial Resource Strain: Not on File (03/19/2022)   Received from [employer]    Financial Resource Strain: 0  Food Insecurity: No Food Insecurity (08/10/2024)   Epic    Worried About Programme Researcher, Broadcasting/film/video in the Last Year: Never true    Ran Out of Food in the Last Year: Never true  Transportation Needs: No Transportation Needs (08/10/2024)   Epic    Lack of Transportation (Medical): No    Lack of Transportation (Non-Medical): No  Physical Activity: Not on File (03/19/2022)   Received from [facility]   Physical Activity    Physical Activity: 0  Stress: Not on File (03/19/2022)   Received from [facility]   Stress    Stress: 0  Social Connections: Not on File (08/04/2023)   Received from [facility]   Social Connections    Connectedness: 0  Intimate Partner Violence: Not At Risk (08/10/2024)   Epic    Fear of Current or Ex-Partner: No     Emotionally Abused: No    Physically Abused: No    Sexually Abused: No  Depression (PHQ2-9): Low Risk (08/10/2024)   Depression (PHQ2-9)    PHQ-2 Score: 0  Alcohol Screen: Not on file  Housing: Unknown (08/10/2024)   Epic    Unable to Pay for Housing in the Last Year: No    Number of Times Moved in the Last Year: Not on file    Homeless in the Last Year: No  Utilities: Not At Risk (08/10/2024)   Epic    Threatened with loss of utilities: No  Health Literacy: Not on file    FAMILY HISTORY: Family History  Problem Relation Age of Onset   Diabetes Mother    Hypertension Mother    COPD Mother    Heart disease Father    Heart attack Father    Alzheimer's disease Father    Diabetes Brother    Asthma Daughter    Stroke Neg Hx    Colon cancer Neg Hx    Stomach cancer Neg Hx    Pancreatic cancer Neg Hx    AAA (abdominal aortic aneurysm) Neg Hx     Review of Systems - Oncology    PHYSICAL EXAMINATION    There were no vitals filed for this visit.  Physical Exam  LABORATORY DATA:  CBC    Component Value Date/Time   WBC 5.7 09/18/2024 1551   RBC 4.53 09/18/2024 1551   HGB 12.3 09/18/2024 1551   HGB 12.3 04/29/2023 1104   HCT 39.0 09/18/2024 1551   HCT 39.1 04/17/2021 0000   PLT 257 09/18/2024 1551   PLT 261 04/17/2021 0000   MCV 86.1 09/18/2024 1551   MCV 84 04/17/2021 0000   MCH 27.2 09/18/2024 1551   MCHC 31.5 09/18/2024 1551   RDW 13.7 09/18/2024 1551   RDW 13.5 04/17/2021 0000   LYMPHSABS 2.1 07/11/2024 1101   LYMPHSABS 1.4 04/17/2021 0000   MONOABS 0.9 07/11/2024 1101   EOSABS 0.1 07/11/2024 1101   EOSABS 0.1 04/17/2021 0000   BASOSABS 0.0 07/11/2024 1101   BASOSABS 0.0 04/17/2021 0000    CMP     Component Value Date/Time   NA 141 11/29/2024 1407   NA 135 08/22/2024 1034   K 4.2 11/29/2024  1407   CL 105 11/29/2024 1407   CO2 26 11/29/2024 1407   GLUCOSE 80 11/29/2024 1407   BUN 12 11/29/2024 1407   BUN 12 08/22/2024 1034   CREATININE 0.73  11/29/2024 1407   CREATININE 0.61 10/05/2016 0937   CALCIUM  9.5 11/29/2024 1407   PROT 6.4 (L) 09/18/2024 1551   PROT 8.2 11/26/2022 1029   ALBUMIN 3.5 09/18/2024 1551   ALBUMIN 4.4 11/26/2022 1029   AST 18 09/18/2024 1551   ALT 19 09/18/2024 1551   ALKPHOS 46 09/18/2024 1551   BILITOT 1.1 09/18/2024 1551   BILITOT 0.5 11/26/2022 1029   GFRNONAA >60 11/29/2024 1407   GFRAA 99 10/17/2019 0945     ASSESSMENT and THERAPY PLAN:   No problem-specific Assessment & Plan notes found for this encounter.   Assessment and Plan Assessment & Plan       All questions were answered. The patient knows to call the clinic with any problems, questions or concerns. We can certainly see the patient much sooner if necessary.  Total encounter time:*** minutes*in face-to-face visit time, chart review, lab review, care coordination, order entry, and documentation of the encounter time.    Marie Kendall, NP 12/19/24 12:41 PM Medical Oncology and Hematology [facility] [address] [place], [place] [address] Tel. [phone]    Fax. [phone]  *Total Encounter Time as defined by the Centers for Medicare and Medicaid Services includes, in addition to the face-to-face time of a patient visit (documented in the note above) non-face-to-face time: obtaining and reviewing outside history, ordering and reviewing medications, tests or procedures, care coordination (communications with other health care professionals or caregivers) and documentation in the medical record.

## 2024-12-20 ENCOUNTER — Other Ambulatory Visit (HOSPITAL_COMMUNITY): Payer: Self-pay | Admitting: Cardiology

## 2024-12-20 DIAGNOSIS — I340 Nonrheumatic mitral (valve) insufficiency: Secondary | ICD-10-CM

## 2024-12-20 DIAGNOSIS — R0602 Shortness of breath: Secondary | ICD-10-CM

## 2024-12-20 DIAGNOSIS — R5383 Other fatigue: Secondary | ICD-10-CM

## 2024-12-20 DIAGNOSIS — I5042 Chronic combined systolic (congestive) and diastolic (congestive) heart failure: Secondary | ICD-10-CM

## 2024-12-20 DIAGNOSIS — I25118 Atherosclerotic heart disease of native coronary artery with other forms of angina pectoris: Secondary | ICD-10-CM

## 2024-12-20 LAB — SURGICAL PATHOLOGY

## 2024-12-21 ENCOUNTER — Encounter

## 2024-12-21 ENCOUNTER — Other Ambulatory Visit (HOSPITAL_BASED_OUTPATIENT_CLINIC_OR_DEPARTMENT_OTHER): Payer: Self-pay

## 2024-12-21 ENCOUNTER — Telehealth: Payer: Self-pay

## 2024-12-21 ENCOUNTER — Other Ambulatory Visit

## 2024-12-21 ENCOUNTER — Other Ambulatory Visit: Payer: Self-pay

## 2024-12-21 ENCOUNTER — Other Ambulatory Visit: Payer: Self-pay | Admitting: Adult Health

## 2024-12-21 DIAGNOSIS — M25511 Pain in right shoulder: Secondary | ICD-10-CM

## 2024-12-21 DIAGNOSIS — C50411 Malignant neoplasm of upper-outer quadrant of right female breast: Secondary | ICD-10-CM

## 2024-12-21 NOTE — Progress Notes (Signed)
 Remote ICD Transmission

## 2024-12-21 NOTE — Progress Notes (Signed)
 Reviewed xray with patient, referred to Dr. Joane at [facility] Medicine.  Patient verbalized understanding.

## 2024-12-21 NOTE — Telephone Encounter (Signed)
 S/w pt to offer telephone visit with MD 12/25/24 instead of in person d/t possible weather conditions. She is agreeable. Appt updated.

## 2024-12-22 ENCOUNTER — Other Ambulatory Visit (HOSPITAL_COMMUNITY): Payer: Self-pay

## 2024-12-22 ENCOUNTER — Encounter: Payer: Self-pay | Admitting: Radiology

## 2024-12-22 DIAGNOSIS — D8685 Sarcoid myocarditis: Secondary | ICD-10-CM

## 2024-12-22 NOTE — Progress Notes (Signed)
 Jennefer Ester PARAS, MD  Daralene Ferol FALCON, RT PROCEDURE / BIOPSY REVIEW Date: 12/22/24  Requested Biopsy site: T11 vertebral body Reason for request: metastatic breast ca Imaging review: Best seen on PET (12/07/24) series 604, image 94  Decision: Approved Imaging modality to perform: IR Schedule with: Moderate Sedation Schedule for: Any VIR  Additional comments: None  Please contact me with questions, concerns, or if issue pertaining to this request arise.  Ester PARAS Jennefer, MD [facility] Radiology       Previous Messages    ----- Message ----- From: Daralene Ferol FALCON, RT Sent: 12/20/2024  11:46 AM EST To: Ir Procedure Requests Subject: CT Biopsy                                      Procedure :CT Biopsy  Reason :biopsy L5 or t11 early metastases Dx: Malignant neoplasm of upper-outer quadrant of right breast in female, estrogen receptor positive (HCC) [C50.411, Z17.0 (ICD-10-CM)]  History :DG Chest 2 View (Accession [id number]) (Order [id number]), CT Angio Chest PE W and/or Wo Contrast (Accession [id number]) (Order [id number]), MM 3D SCREENING MAMMOGRAM BILATERAL BREAST (Accession [id number]) (Order [id number]), DG Thoracic Spine W/Swimmers (Accession [id number]) (Order [id number]), NM PET CT MYOCARDIAL SARCOIDOSIS (Accession [id number]) (Order [id number]), NM PET CT MYOCARDIAL SARCOIDOSIS (Accession [id number]) (Order [id number]), NM PET CT MYOCARDIAL SARCOIDOSIS (Accession [id number]) (Order [id number]), DG Chest 2 View (Accession [id number]) (Order [id number]), CT CHEST ABDOMEN PELVIS W CONTRAST (Accession [id number]) (Order [id number]), NM Bone Scan Whole Body (Accession [id number]) (Order [id number]), NM PET Image Initial (PI) Skull Base To Thigh (Accession [id number]) (Order [id number]), MM 3D DIAGNOSTIC MAMMOGRAM BILATERAL BREAST (Accession [id number]) (Order [id number])  Provider:Causey, Morna Pickle, NP  Provider contact ; [phone]

## 2024-12-25 ENCOUNTER — Inpatient Hospital Stay: Admitting: Hematology and Oncology

## 2024-12-25 DIAGNOSIS — C50411 Malignant neoplasm of upper-outer quadrant of right female breast: Secondary | ICD-10-CM | POA: Diagnosis not present

## 2024-12-25 DIAGNOSIS — Z170 Estrogen receptor positive status [ER+]: Secondary | ICD-10-CM | POA: Diagnosis not present

## 2024-12-25 NOTE — Progress Notes (Signed)
 HEMATOLOGY-ONCOLOGY TELEPHONE VISIT PROGRESS NOTE  I connected with our patient on 12/25/24 at  1:45 PM EST by telephone and verified that I am speaking with the correct person using two identifiers.  I discussed the limitations, risks, security and privacy concerns of performing an evaluation and management service by telephone and the availability of in person appointments.  I also discussed with the patient that there may be a patient responsible charge related to this service. The patient expressed understanding and agreed to proceed.   History of Present Illness: Telephone follow-up to discuss the results of the breast biopsy and to talk about the treatment plan  History of Present Illness Marie Tanner is 69 year old who was originally diagnosed in 2017 with stage II right breast cancer treated with lumpectomy followed by adjuvant chemotherapy radiation and 5 years of antiestrogen therapy with exemestane.  We had performed guardant reveal for MRD testing which came back positive.  She underwent PET CT scan that revealed an axillary recurrence as well as bone lesions in T11 and L4.  Biopsy of the axillary recurrence did come back as positive for grade 2 IDC ER positive PR positive HER2 negative with Ki-67 of 30%.  After much discussion we ordered a CT-guided bone biopsy and that has not been done.  She was started on anastrozole  therapy and is tolerating it extremely well.  She had shoulder pain for which she received a shoulder steroid injection on Friday and she reports that she is feeling a lot better.    Oncology History  Malignant neoplasm of upper-outer quadrant of right breast in female, estrogen receptor positive (HCC)  01/2016 Initial Diagnosis   Treated at [facility]: Lumpectomy: Stage IIa T1c N1a M0 IDC ER 100%, PR 0%, HER2 negative   2017 - 2017 Chemotherapy   4 cycles of Taxotere and Cytoxan   2017 - 2017 Radiation Therapy   Adjuvant radiation therapy   2017 -  Anti-estrogen oral  therapy   Letrozole switched to exemestane     REVIEW OF SYSTEMS:   Constitutional: Denies fevers, chills or abnormal weight loss All other systems were reviewed with the patient and are negative. Observations/Objective:     Assessment Plan:  Malignant neoplasm of upper-outer quadrant of right breast in female, estrogen receptor positive (HCC) 2017: [facility]: Right lumpectomy: T1c N1a M0 stage IIa IDC ER 100% PR 0% HER2 negative Status post 4 cycles of Taxotere and Cytoxan Followed by radiation Antiestrogen therapy with exemestane started 2017 completed 2022   Current treatment:  Mammogram 12/13/2023: Benign breast density category B Breast exam 08/10/2024: Benign   Guardant reveal 08/29/2024: Positive for circulating tumor DNA less than 0.05% 12/07/2024: PET/CT: Hypermetabolic soft tissue density right axilla, small hypermetabolic prevascular lymph node and hypermetabolism T11 and L5  Anastrozole : started 12/23/23 12/19/2024: Breast biopsy: Grade 2 IDC ER 100%, PR 20%, HER2 0 negative, KI 67: 30%  Plan: Consult radiology for biopsy of the vertebral abnormality detected on the PET scan If the bone lesions are not metastatic then we could discuss with surgery to talk about resection of the isolated axillary recurrence Treatment plan: Antiestrogen therapy will be continued with anastrozole .  If there is evidence of metastatic disease on the bone we will consider adding CDK 4 and 6 inhibitor. If the bone biopsy is negative for metastatic disease then I will consult surgery for resection of the axillary recurrence Shoulder Pain: Cortisol inj was given by Dr. Janit on 12/22/24.  Follow-up after the bone biopsy to  discuss results   I discussed the assessment and treatment plan with the patient. The patient was provided an opportunity to ask questions and all were answered. The patient agreed with the plan and demonstrated an understanding of the instructions. The patient was advised to  call back or seek an in-person evaluation if the symptoms worsen or if the condition fails to improve as anticipated.   I provided 20 minutes of non-face-to-face time during this encounter.  This includes time for charting and coordination of care   Naomi MARLA Chad, MD

## 2024-12-25 NOTE — Assessment & Plan Note (Signed)
 2017: [facility]: Right lumpectomy: T1c N1a M0 stage IIa IDC ER 100% PR 0% HER2 negative Status post 4 cycles of Taxotere and Cytoxan Followed by radiation Antiestrogen therapy with exemestane started 2017 completed 2022   Current treatment:  Mammogram 12/13/2023: Benign breast density category B Breast exam 08/10/2024: Benign   Guardant reveal 08/29/2024: Positive for circulating tumor DNA less than 0.05% 12/07/2024: PET/CT: Hypermetabolic soft tissue density right axilla, small hypermetabolic prevascular lymph node and hypermetabolism T11 and L5  Anastrozole : started 12/23/23 12/19/2024: Breast biopsy: Grade 2 IDC ER 100%, PR 20%, HER2 0 negative, KI 67: 30%  Plan: Consult radiology for biopsy of the vertebral abnormality detected on the PET scan If the bone lesions are not metastatic then we could discuss with surgery to talk about resection of the isolated axillary recurrence Treatment plan: Antiestrogen therapy will be continued with anastrozole .  If there is evidence of metastatic disease on the bone we will consider adding CDK 4 and 6 inhibitor. If the bone biopsy is negative for metastatic disease then I will consult surgery for resection of the axillary recurrence Shoulder Pain: Cortisol inj was given by Dr. Janit on 12/22/24.  Follow-up after the bone biopsy to discuss results

## 2024-12-31 ENCOUNTER — Encounter: Payer: Self-pay | Admitting: *Deleted

## 2025-01-02 ENCOUNTER — Encounter: Payer: Self-pay | Admitting: Podiatry

## 2025-01-02 ENCOUNTER — Encounter (HOSPITAL_COMMUNITY): Payer: Self-pay

## 2025-01-02 ENCOUNTER — Ambulatory Visit: Admitting: Podiatry

## 2025-01-02 DIAGNOSIS — L84 Corns and callosities: Secondary | ICD-10-CM

## 2025-01-02 DIAGNOSIS — E1142 Type 2 diabetes mellitus with diabetic polyneuropathy: Secondary | ICD-10-CM

## 2025-01-02 DIAGNOSIS — B351 Tinea unguium: Secondary | ICD-10-CM

## 2025-01-02 NOTE — Progress Notes (Signed)
 "  Subjective:  Patient ID: Marie Tanner, female    DOB: [birth year],  MRN: [id number]  Marie Tanner presents to clinic today for at risk foot care with history of diabetic neuropathy and callus(es) of both feet and painful mycotic toenails that are difficult to trim. Painful toenails interfere with ambulation. Aggravating factors include wearing enclosed shoe gear. Pain is relieved with periodic professional debridement. Painful calluses are aggravated when weightbearing with and without shoegear. Pain is relieved with periodic professional debridement. Patient is currently undergoing treatment for breast cancer of right breast. She will have biopsy done on Thursday for spots on her spine. She has seen Dr. McCaughan for tinea pedis of both feet and was prescribed two 30 day courses of terbinafine  250 mg. She relates improvement in feet with oral medication. Chief Complaint  Patient presents with   Diabetes    DFC NIDDM A1C 7.0. Toenail trim. LOV with PCP 11/2024.   New problem(s): None.   PCP is Delores Rojelio Caldron, NP.  Allergies[1]  Review of Systems: Negative except as noted in the HPI.  Objective: No changes noted in today's physical examination. There were no vitals filed for this visit. Marie Tanner is a pleasant 69 y.o. female in NAD. AAO x 3.  Vascular Examination: Capillary refill time immediate b/l. Vascular status intact b/l with palpable DP pulses; faintly palpable PT pulses. Pedal hair absent b/l. No edema. No pain with calf compression b/l. Skin temperature gradient WNL b/l. No cyanosis or clubbing noted b/l. No ischemia or gangrene b/l. Trace edema noted BLE.  Neurological Examination: Sensation grossly intact b/l with 10 gram monofilament. Vibratory sensation intact b/l. Pt has subjective symptoms of neuropathy.  Dermatological Examination:  Skin warm and supple b/l.  No open wounds. No interdigital macerations. Improvement in scaling noted peripherally and plantarly  b/l feet; does still have some residual scaling.  No interdigital macerations.  No blisters, no weeping. No signs of secondary bacterial infection noted.  Toenails 1-5 b/l thick, discolored, elongated with subungual debris and pain on dorsal palpation.  Hyperkeratotic lesion(s) submet head 2 left foot, submet head 2 right foot, submet head 3 left foot, and submet head 3 right foot.  No erythema, no edema, no drainage, no fluctuance.  Musculoskeletal Examination: Muscle strength 5/5 to all lower extremity muscle groups bilaterally. Hammertoe(s) 2-5 b/l.SABRA No pain, crepitus or joint limitation noted with ROM b/l LE.  Patient ambulates independently without assistive aids.  Radiographs: None  Assessment/Plan: 1. Pain due to onychomycosis of toenails of both feet   2. Callus   3. Diabetic peripheral neuropathy associated with type 2 diabetes mellitus [facility])   Consent given for treatment. Patient examined. All patient's and/or POA's questions/concerns addressed on today's visit. Mycotic toenails 1-5 b/l  debrided in length and girth without incident. Callus(es) submet head 2 left foot, submet head 2 right foot, submet head 3 left foot, and submet head 3 right foot pared with sharp debridement without incident. Continue foot and shoe inspections daily. Monitor blood glucose per PCP/Endocrinologist's recommendations. She may schedule virtual appointment with Dr. Loel after her work up with Oncology. Continue soft, supportive shoe gear daily. Report any pedal injuries to medical professional. Call office if there are any quesitons/concerns.  Return in about 3 months (around 04/01/2025).  Delon LITTIE Merlin, DPM      [facility] LOCATION: [address]  [place], [place] [address]                   Office [phone]   [facility] LOCATION: [address] [place], [place] [address] Office [phone]      [1]  Allergies Allergen Reactions    Dilaudid [Hydromorphone Hcl] Anaphylaxis, Itching and Swelling   Epinephrine  Anxiety and Other (See Comments)    Severe anxiety   Lactose Intolerance (Gi) Diarrhea   Tilactase     Other Reaction(s): Diarrhea   Jardiance  [Empagliflozin ]     YEAST INFECTION    Shellfish Allergy    "

## 2025-01-03 ENCOUNTER — Other Ambulatory Visit (HOSPITAL_COMMUNITY): Payer: Self-pay | Admitting: Radiology

## 2025-01-03 DIAGNOSIS — C50411 Malignant neoplasm of upper-outer quadrant of right female breast: Secondary | ICD-10-CM

## 2025-01-03 NOTE — H&P (Signed)
 "     Chief Complaint: Patient was seen in consultation today for right breast CA, new T 11 vertebral body lesion at the request of Causey,Lindsey Cornetto  Referring Physician(s): Causey,Lindsey Cornetto  Supervising Physician: Hughes Simmonds  Patient Status: [facility] - Out-pt  History of Present Illness: Marie Tanner is a 69 y.o. female with PMHs of HTN, HF, CAD, cardiac sarcoidosis, DVT, T2DM, breast CA s/p lumpectomy and chemoradiation therapy, new T 11 vertebral body lesion who presents for biopsy.   Patient was diagnosed with right breast CA in 2017, underwent lumpectomy and chemoradiation therapy. Restaging PET on 12/15/24 unfortunately showed:  1. Hypermetabolic soft tissue density at the junction of the right axilla and lateral right breast could represent either new breast primary or isolated axillary nodal metastasis. 2. Small hypermetabolic prevascular node, highly suspicious for metastatic disease. 3. Diffuse mildly heterogeneous marrow hypermetabolism with more focal small areas of hypermetabolism within T11 and L5, suspicious for low volume osseous metastasis. 4. Incidental findings, including: Coronary artery atherosclerosis. Aortic Atherosclerosis (ICD10-I70.0). Cholelithiasis. Aortic valvular calcifications. Consider echocardiography to evaluate for valvular dysfunction.  She underwent breat biopsy on 12/20/24, pathology showed invasive mammary carcinoma. IR was requested to evaluate the patient for possible biopsy of the T 11 vertebral body lesion, case was reviewed and approved for image guided T11 vertebral bopsy lesion biopsy by Dr. Jennefer.   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   GERD (gastroesophageal  reflux disease)    HCAP (healthcare-associated pneumonia) 11/22/2014   Heart murmur    High cholesterol    History of blood transfusion 09/2014   related to MI   History of gout    Hypertension    Liver laceration 10/19/2014   Palpitations 12/12/2020   Pulmonary edema 10/19/2014   Renal disorder    Renal disorder    Right hip pain 04/03/2021   Sleep apnea    suppose to wear mask but I don't (11/22/2014)   STEMI (ST elevation myocardial infarction) (HCC) 10/19/2014   Type II diabetes mellitus (HCC)    Urticaria     Past Surgical History:  Procedure Laterality Date   ABDOMINAL HYSTERECTOMY  ~ 2000   APPLICATION OF WOUND VAC  10/2014   over my naval (11/22/2014)   BREAST BIOPSY Right 12/19/2024   US  RT BREAST BX W LOC DEV 1ST LESION IMG BX SPEC US  GUIDE 12/19/2024 [facility] MAMMOGRAPHY   BREAST LUMPECTOMY Right 2017   BREAST LUMPECTOMY WITH AXILLARY LYMPH NODE BIOPSY     BREAST SURGERY     CESAREAN SECTION  1980; 1982   CORONARY ANGIOPLASTY WITH STENT PLACEMENT  09/2014   multiple/notes 11/22/2014   EXTRACORPOREAL CIRCULATION     Femerol artery repair Right 09/2014   pt insists it was a right femoral artery repair on 11/22/2014   ICD IMPLANT N/A 07/27/2024   Procedure: ICD IMPLANT;  Surgeon: Waddell Danelle ORN, MD;  Location: [facility] INVASIVE CV LAB;  Service: Cardiovascular;  Laterality: N/A;   LAPAROSCOPIC ABDOMINAL EXPLORATION     LEFT HEART CATH AND CORONARY ANGIOGRAPHY N/A 12/07/2017   Procedure: LEFT HEART CATH AND CORONARY ANGIOGRAPHY;  Surgeon: Anner Alm ORN, MD;  Location: [facility] INVASIVE CV LAB;  Service: Cardiovascular;  Laterality: N/A;  LEFT VENTRICULAR ASSIST DEVICE     pt is not aware of this hx on 11/22/2014   Liver laceration repair  09/2014   related to CPR/notes 11/22/2014   RIGHT/LEFT HEART CATH AND CORONARY ANGIOGRAPHY N/A 04/21/2021   Procedure: RIGHT/LEFT HEART CATH AND CORONARY ANGIOGRAPHY;  Surgeon: Mady Bruckner, MD;  Location: [facility] INVASIVE CV LAB;   Service: Cardiovascular;  Laterality: N/A;   TEE WITHOUT CARDIOVERSION N/A 03/13/2021   Procedure: TRANSESOPHAGEAL ECHOCARDIOGRAM (TEE);  Surgeon: Loni Soyla LABOR, MD;  Location: [facility] ENDOSCOPY;  Service: Cardiology;  Laterality: N/A;   TRACHEOSTOMY  09/2014   closed on it's own when they took it out   TUBAL LIGATION  1982    Allergies: Dilaudid [hydromorphone hcl], Epinephrine , Lactose intolerance (gi), Tilactase, Jardiance  [empagliflozin ], and Shellfish allergy  Medications: Prior to Admission medications  Medication Sig Start Date End Date Taking? Authorizing Provider  acetaminophen  (TYLENOL ) 500 MG tablet Take 1,000 mg by mouth every 6 (six) hours as needed for moderate pain.    [provider]  anastrozole  (ARIMIDEX ) 1 MG tablet Take 1 tablet (1 mg total) by mouth daily. 12/19/24   Crawford Morna Pickle, NP  aspirin  EC 81 MG tablet Take 1 tablet (81 mg total) by mouth daily. 04/08/15   Dominick Ned, MD  blood glucose meter kit and supplies KIT by Does not apply route daily as needed. Dispense based on patient and insurance preference. Use up to four times daily as directed. (FOR ICD-9 250.00, 250.01).    [provider]  BRILINTA  60 MG TABS tablet TAKE 1 TABLET BY MOUTH 2 TIMES DAILY. 07/18/24   Raford Riggs, MD  calcium -vitamin D (OSCAL WITH D) 500-5 MG-MCG tablet Take 1 tablet by mouth 2 (two) times daily. 04/29/23   Sabharwal, Aditya, DO  clotrimazole -betamethasone  (LOTRISONE ) cream Apply to feet and between toes twice daily for 6 weeks. 09/19/24   Gaynel Delon CROME, DPM  Dulaglutide  (TRULICITY ) 4.5 MG/0.5ML SOPN Inject 4.5 mg as directed once a week. 09/15/23   Raford Riggs, MD  Evolocumab  (REPATHA  SURECLICK) 140 MG/ML SOAJ INJECT 140 MG INTO THE SKIN EVERY 14 (FOURTEEN) DAYS. 01/04/24   Raford Riggs, MD  fluticasone  (FLONASE ) 50 MCG/ACT nasal spray Place 1 spray into both nostrils as needed for allergies. 04/06/23   [provider]   furosemide  (LASIX ) 40 MG tablet TAKE 1 TABLET ONCE PER WEEK WITH ADDITIONAL TABLET AS NEEDED FOR SHORTNESS OF BREATH OR WEIGHT GAIN OF 2 POUNDS IN 24 HOURS OR 5 POUNDS IN 1 WEEK 12/20/24   Rolan Ezra RAMAN, MD  gabapentin (NEURONTIN) 300 MG capsule Take 300 mg by mouth at bedtime.    [provider]  glucose blood test strip Use as instructed. E11.5. To check blood glucose daily. 05/02/18   Stallings, Zoe A, MD  ketoconazole  (NIZORAL ) 2 % cream Apply to both feet and between toes once daily. 05/31/24   Gaynel Delon CROME, DPM  magnesium  oxide (MAG-OX) 400 MG tablet TAKE 1 TABLET BY MOUTH EVERY DAY 04/04/20   Stallings, Zoe A, MD  metoprolol  succinate (TOPROL -XL) 25 MG 24 hr tablet Take 1 tablet (25 mg total) by mouth in the morning AND 0.5 tablets (12.5 mg total) every evening. Take with or immediately following a meal. 10/16/24   Zenaida Morene PARAS, MD  mycophenolate  (CELLCEPT ) 500 MG tablet Take 3 tablets (1,500 mg total) by mouth 2 (two) times daily. 12/18/24   Zenaida Morene PARAS, MD  naproxen  (NAPROSYN ) 500 MG tablet Take 1 tablet (500 mg total) by  mouth 2 (two) times daily with a meal. 12/15/24   Causey, Morna Pickle, NP  nitroGLYCERIN  (NITROSTAT ) 0.4 MG SL tablet Place 1 tablet (0.4 mg total) under the tongue every 5 (five) minutes as needed for chest pain. max of 3 if no relief call 911 12/12/22   Raford Riggs, MD  pantoprazole  (PROTONIX ) 40 MG tablet TAKE 1 TABLET BY MOUTH EVERY DAY 07/26/24   Sabharwal, Aditya, DO  predniSONE  (DELTASONE ) 10 MG tablet Take 1 tablet (10 mg total) by mouth daily with breakfast. Patient not taking: Reported on 01/02/2025 10/16/24   Zenaida Morene PARAS, MD  rosuvastatin  (CRESTOR ) 20 MG tablet Take 1 tablet by mouth 3 times per week 02/15/24   Sabharwal, Aditya, DO  sacubitril -valsartan  (ENTRESTO ) 97-103 MG Take 1 tablet by mouth 2 (two) times daily. 05/26/24   Raford Riggs, MD  spironolactone  (ALDACTONE ) 25 MG tablet Take 1 tablet (25 mg total) by mouth  daily. 02/15/24   Sabharwal, Aditya, DO  terbinafine  (LAMISIL ) 250 MG tablet Take 1 tablet (250 mg total) by mouth daily. 11/27/24   McCaughan, Dia D, DPM  traMADol  (ULTRAM ) 50 MG tablet Take 1 tablet (50 mg total) by mouth every 12 (twelve) hours as needed. 12/15/24   Crawford Morna Pickle, NP  triamcinolone  cream (KENALOG ) 0.1 % Apply 1 Application topically daily. 04/27/24   [provider]     Family History  Problem Relation Age of Onset   Diabetes Mother    Hypertension Mother    COPD Mother    Heart disease Father    Heart attack Father    Alzheimer's disease Father    Diabetes Brother    Asthma Daughter    Breast cancer Maternal Aunt 64 - 66   Breast cancer Maternal Cousin 61 - 47   Stroke Neg Hx    Colon cancer Neg Hx    Stomach cancer Neg Hx    Pancreatic cancer Neg Hx    AAA (abdominal aortic aneurysm) Neg Hx     Social History   Socioeconomic History   Marital status: Divorced    Spouse name: Not on file   Number of children: 2   Years of education: Not on file   Highest education level: Not on file  Occupational History   Not on file  Tobacco Use   Smoking status: Never   Smokeless tobacco: Never  Vaping Use   Vaping status: Never Used  Substance and Sexual Activity   Alcohol use: Never   Drug use: No   Sexual activity: Not Currently  Other Topics Concern   Not on file  Social History Narrative   Not on file   Social Drivers of Health   Tobacco Use: Low Risk (01/02/2025)   Patient History    Smoking Tobacco Use: Never    Smokeless Tobacco Use: Never    Passive Exposure: Not on file  Financial Resource Strain: Not on File (03/19/2022)   Received from [employer]    Financial Resource Strain: 0  Food Insecurity: No Food Insecurity (08/10/2024)   Epic    Worried About Programme Researcher, Broadcasting/film/video in the Last Year: Never true    Ran Out of Food in the Last Year: Never true  Transportation Needs: No Transportation Needs  (08/10/2024)   Epic    Lack of Transportation (Medical): No    Lack of Transportation (Non-Medical): No  Physical Activity: Not on File (03/19/2022)   Received from [facility]   Physical Activity  Physical Activity: 0  Stress: Not on File (03/19/2022)   Received from [facility]   Stress    Stress: 0  Social Connections: Not on File (08/04/2023)   Received from [facility]   Social Connections    Connectedness: 0  Depression (PHQ2-9): Low Risk (08/10/2024)   Depression (PHQ2-9)    PHQ-2 Score: 0  Alcohol Screen: Not on file  Housing: Unknown (08/10/2024)   Epic    Unable to Pay for Housing in the Last Year: No    Number of Times Moved in the Last Year: Not on file    Homeless in the Last Year: No  Utilities: Not At Risk (08/10/2024)   Epic    Threatened with loss of utilities: No  Health Literacy: Not on file     Review of Systems: A 12 point ROS discussed and pertinent positives are indicated in the HPI above.  All other systems are negative.  Vital Signs: There were no vitals taken for this visit.  *** Physical Exam     Imaging: US  RT BREAST BX W LOC DEV 1ST LESION IMG BX SPEC US  GUIDE Addendum Date: 12/20/2024 ADDENDUM REPORT: 12/20/2024 18:35 ADDENDUM: PATHOLOGY revealed: Breast, right, needle core biopsy, 10 o'clock 16 cmfn ( spiral clip)- INVASIVE MAMMARY CARCINOMA- MAMMARY CARCINOMA IN SITU, SOLID, INTERMEDIATE NUCLEAR GRADE, WITHOUT NECROSIS- OVERALL GRADE: 2- LYMPHOVASCULAR INVASION: NOT IDENTIFIED- CANCER LENGTH: 1.3 CM- CALCIFICATIONS: PRESENT. Pathology results are CONCORDANT with imaging findings, per Dr. Curtistine Noble. Pathology results and recommendations below were discussed with patient by telephone. Patient reported biopsy site doing well with no adverse symptoms, and only slight tenderness at the site. Post biopsy care instructions were reviewed, questions were answered and my direct phone number was provided. Patient was instructed to call [facility]  Imaging for any additional questions or concerns related to biopsy site. RECOMMENDATION: Surgical and oncological consultation. Request for surgical consultation relayed to Olam Bunnell and Landry Finger at [facility]. Pathology results reported by Mliss CHARM Molt RN 12/20/2024. Electronically Signed   By: Curtistine Noble   On: 12/20/2024 18:35   Result Date: 12/20/2024 CLINICAL DATA:  69 year old female presents for a right breast/axilla ultrasound-guided biopsy. EXAM: ULTRASOUND GUIDED RIGHT BREAST CORE NEEDLE BIOPSY COMPARISON:  Previous exam(s). PROCEDURE: The procedure was discussed with the patient including benefits and alternatives. We discussed the risks of the procedure, including infection, bleeding, tissue injury and inadequate sampling. Post biopsy clip placement and possible clip migration were also discussed. Informed written consent was given. The usual time-out protocol was performed immediately prior to the procedure. Right breast: The patient was scanned and the area of interest in the 10 o' clock position, 16 cm from the nipple, was localized which correlates with the area of concern seen on prior imaging studies. This area was targeted for ultrasound guided core needle biopsy. After sterile skin prep and 1% lidocaine  with and without epinephrine  for local anesthesia, a 14 gauge spring-loaded biopsy needle was used under direct ultrasound visualization to obtain several cores of tissue from the mass using a lateral to medial approach. Next, under ultrasound guidance, a HydroMARK spiral clip was placed in the sampled mass. There were no immediate post-procedure complications. A follow up 2 view mammogram was performed and dictated separately. IMPRESSION: Ultrasound guided biopsy of the right breast as above. No apparent complications. Electronically Signed: By: Curtistine Noble On: 12/19/2024 15:53   MM CLIP PLACEMENT RIGHT Result Date: 12/19/2024 CLINICAL DATA:  68 year old female  status post right  breast ultrasound-guided biopsy. EXAM: 3D DIAGNOSTIC RIGHT MAMMOGRAM POST ULTRASOUND BIOPSY COMPARISON:  Previous exam(s). ACR Breast Density Category a: The breasts are almost entirely fatty. FINDINGS: 3D Mammographic images were obtained following ultrasound guided biopsy of right axillary tail. The biopsy marking clip is in expected position at the site of biopsy. IMPRESSION: Appropriate positioning of the spiral HydroMARK shaped biopsy marking clip at the site of biopsy in the location. Final Assessment: Post Procedure Mammograms for Marker Placement Electronically Signed   By: Curtistine Noble   On: 12/19/2024 16:07   MM 3D DIAGNOSTIC MAMMOGRAM BILATERAL BREAST Result Date: 12/19/2024 CLINICAL DATA:  68 year old female presents for evaluation right axillary uptake on recent PET scan. History of prior breast cancer and lumpectomy in 2017. EXAM: DIGITAL DIAGNOSTIC BILATERAL MAMMOGRAM WITH TOMOSYNTHESIS AND CAD; ULTRASOUND RIGHT BREAST LIMITED TECHNIQUE: Bilateral digital diagnostic mammography and breast tomosynthesis was performed. The images were evaluated with computer-aided detection. ; Targeted ultrasound examination of the right breast was performed Additional 2 and 3D spot compression views were obtained. COMPARISON:  Previous exam(s). ACR Breast Density Category b: There are scattered areas of fibroglandular density. FINDINGS: Right mammogram: Increased density and microcalcifications of the spiculated mass about the axilla, likely postsurgical changes/fat necrosis. No other suspicious findings identified. Postsurgical changes about the lower central breast, anterior third. Left mammogram:  No findings suspicious for malignancy. Right breast Ultrasound: Targeted imaging at about the 10 o'clock position, approximately 16 centimeters from the nipple, demonstrates an irregular hypoechoic/anechoic mass measuring approximately 1.9 x 1.3 x 1.9 cm. Minimal increased vascularity and posterior  acoustic shadowing are noted. This correlates favorably with mammographic findings described above and likely represents postsurgical change/fat necrosis; however, given the patient's history of breast cancer, a biopsy will be performed to prove it is benign. IMPRESSION: Indeterminate spiculated mass about the right axilla. RECOMMENDATION: Right axillary ultrasound guided biopsy. I have discussed the findings and recommendations with the patient. If applicable, a reminder letter will be sent to the patient regarding the next appointment. BI-RADS CATEGORY  4: Suspicious. Electronically Signed   By: Curtistine Noble   On: 12/19/2024 15:51   US  LIMITED ULTRASOUND INCLUDING AXILLA RIGHT BREAST Result Date: 12/19/2024 CLINICAL DATA:  69 year old female presents for evaluation right axillary uptake on recent PET scan. History of prior breast cancer and lumpectomy in 2017. EXAM: DIGITAL DIAGNOSTIC BILATERAL MAMMOGRAM WITH TOMOSYNTHESIS AND CAD; ULTRASOUND RIGHT BREAST LIMITED TECHNIQUE: Bilateral digital diagnostic mammography and breast tomosynthesis was performed. The images were evaluated with computer-aided detection. ; Targeted ultrasound examination of the right breast was performed Additional 2 and 3D spot compression views were obtained. COMPARISON:  Previous exam(s). ACR Breast Density Category b: There are scattered areas of fibroglandular density. FINDINGS: Right mammogram: Increased density and microcalcifications of the spiculated mass about the axilla, likely postsurgical changes/fat necrosis. No other suspicious findings identified. Postsurgical changes about the lower central breast, anterior third. Left mammogram:  No findings suspicious for malignancy. Right breast Ultrasound: Targeted imaging at about the 10 o'clock position, approximately 16 centimeters from the nipple, demonstrates an irregular hypoechoic/anechoic mass measuring approximately 1.9 x 1.3 x 1.9 cm. Minimal increased vascularity and  posterior acoustic shadowing are noted. This correlates favorably with mammographic findings described above and likely represents postsurgical change/fat necrosis; however, given the patient's history of breast cancer, a biopsy will be performed to prove it is benign. IMPRESSION: Indeterminate spiculated mass about the right axilla. RECOMMENDATION: Right axillary ultrasound guided biopsy. I have discussed the findings and recommendations with the patient. If  applicable, a reminder letter will be sent to the patient regarding the next appointment. BI-RADS CATEGORY  4: Suspicious. Electronically Signed   By: Curtistine Noble   On: 12/19/2024 15:51   DG Shoulder Right Result Date: 12/19/2024 EXAM: 1 VIEW(S) XRAY OF THE RIGHT SHOULDER 12/19/2024 01:47:00 PM COMPARISON: None available. CLINICAL HISTORY: right shoulder pain, evaluate right shoulder pain, evaluate right shoulder pain, evaluate FINDINGS: BONES AND JOINTS: Mild superior subluxation of the humeral head in relation to the glenoid compatible with rotator cuff pathology. Moderate-advanced glenohumeral joint space narrowing. Acromioclavicular joint space narrowing and osteophyte formation. Faint calcific density along posterior aspect of humeral head consistent with calcific tendinopathy. No acute fracture. SOFT TISSUES: Surgical clips in axilla noted. No abnormal calcifications. Visualized lung is unremarkable. IMPRESSION: 1. Moderate-advanced glenohumeral joint space loss. 2. Mild superior subluxation of the humeral head in relation to the glenoid compatible with rotator cuff pathology. Electronically signed by: Norman Gatlin MD 12/19/2024 01:59 PM EST RP Workstation: [id number]   CUP PACEART REMOTE DEVICE CHECK Result Date: 12/18/2024 ICD Scheduled remote reviewed. Normal device function.  Presenting rhythm: Regular VS 1 NSVT, HR 167, 41sec in duraiton per device Next remote transmission per protocol. LA, CVRS  NM PET Image Initial (PI) Skull Base To  Thigh Result Date: 12/15/2024 CLINICAL DATA:  Initial treatment strategy for right sided breast cancer. Evaluate for metastasis. Elevated tumor markers. EXAM: NUCLEAR MEDICINE PET SKULL BASE TO THIGH TECHNIQUE: 10.1 mCi F-18 FDG was injected intravenously. Full-ring PET imaging was performed from the skull base to thigh after the radiotracer. CT data was obtained and used for attenuation correction and anatomic localization. Fasting blood glucose: And 113 mg/dl COMPARISON:  CT and bone scan of 09/12/2024. FINDINGS: Mediastinal blood pool activity: SUV max 2.5 Liver activity: SUV max NA NECK: No areas of abnormal hypermetabolism. Incidental CT findings: No cervical adenopathy. CHEST: No pulmonary parenchymal hypermetabolism identified. A prevascular node measures 8 mm and SUV 7.4 on image 61/4. Soft tissue density at the junction of the right axilla and lateral right breast measures 1.3 x 2.0 cm and SUV 5.5 on image 65/4. this was similar in size on the comparison CT. Incidental CT findings: Pacer. Mild cardiomegaly. Aortic valve calcification. Transverse aortic and multivessel coronary artery calcification. Presumed radiation fibrosis in the right apex and anterior right upper lobe. Bibasilar scarring. Right hemidiaphragm elevation. ABDOMEN/PELVIS: No abdominopelvic parenchymal or nodal hypermetabolism. Incidental CT findings: Normal adrenal glands. Tiny dependent gallstones. Ventral abdominopelvic wall hernias containing transverse colon without obstruction. Hysterectomy. Mild pelvic floor laxity. SKELETON: Heterogeneous marrow activity throughout. The most focal, suspicious area is within the left side of the T11 vertebral body, possibly corresponding to a lucent lesion. 8 mm and SUV 8.3 on image 94/4. Within the anterior left L5 vertebral body, without CT correlate at SUV 4.8. Incidental CT findings: Presumed bone island in the right iliac wing at 9 mm. IMPRESSION: 1. Hypermetabolic soft tissue density at the  junction of the right axilla and lateral right breast could represent either new breast primary or isolated axillary nodal metastasis. 2. Small hypermetabolic prevascular node, highly suspicious for metastatic disease. 3. Diffuse mildly heterogeneous marrow hypermetabolism with more focal small areas of hypermetabolism within T11 and L5, suspicious for low volume osseous metastasis. 4. Incidental findings, including: Coronary artery atherosclerosis. Aortic Atherosclerosis (ICD10-I70.0). Cholelithiasis. Aortic valvular calcifications. Consider echocardiography to evaluate for valvular dysfunction. Electronically Signed   By: Rockey Kilts M.D.   On: 12/15/2024 13:08    Labs:  CBC: Recent Labs  05/09/24 0953 07/11/24 1101 09/18/24 1551  WBC 3.2* 7.8 5.7  HGB 11.2* 11.9* 12.3  HCT 35.1* 37.9 39.0  PLT 299 322 257    COAGS: No results for input(s): INR, APTT in the last 8760 hours.  BMP: Recent Labs    05/09/24 0953 07/11/24 1101 08/22/24 1034 09/18/24 1551 11/29/24 1407  NA 137 139 135 136 141  K 4.8 4.4 4.7 4.8 4.2  CL 103 104 97 101 105  CO2 24 26 19* 27 26  GLUCOSE 88 93 78 161* 80  BUN 12 12 12 11 12   CALCIUM  9.4 9.6 10.0 9.4 9.5  CREATININE 0.86 0.80 0.83 0.85 0.73  GFRNONAA >60 >60  --  >60 >60    LIVER FUNCTION TESTS: Recent Labs    05/09/24 0953 09/18/24 1551  BILITOT 0.8 1.1  AST 16 18  ALT 15 19  ALKPHOS 54 46  PROT 6.7 6.4*  ALBUMIN 3.5 3.5    TUMOR MARKERS: No results for input(s): AFPTM, CEA, CA199, CHROMGRNA in the last 8760 hours.  Assessment and Plan: 69 y.o. female with recurrent right breast CA with concern for metastasis, she presents for T11 vertebral body lesion biopsy today.   NPO since *** VSS CBC *** INR *** AC/AP: ASA 81 mg, Brilinta  60 mg BID, *** Allergies reviewed   Risks and benefits of T11 vertebral body lesion biopsy was discussed with the patient and/or patient's family including, but not limited to bleeding,  infection, damage to adjacent structures or low yield requiring additional tests.  All of the questions were answered and there is agreement to proceed.  Consent signed and in chart.   Thank you for this interesting consult.  I greatly enjoyed meeting Marie Tanner and look forward to participating in their care.  A copy of this report was sent to the requesting provider on this date.  Electronically Signed: Toya VEAR Cousin, PA-C 01/03/2025, 8:42 AM   I spent a total of  30 Minutes   in face to face in clinical consultation, greater than 50% of which was counseling/coordinating care for T 11 vertebral body lesion biopsy.   This chart was dictated using voice recognition software.  Despite best efforts to proofread,  errors can occur which can change the documentation meaning.   "

## 2025-01-04 ENCOUNTER — Ambulatory Visit (HOSPITAL_COMMUNITY)
Admission: RE | Admit: 2025-01-04 | Discharge: 2025-01-04 | Disposition: A | Source: Ambulatory Visit | Attending: Adult Health | Admitting: Adult Health

## 2025-01-04 ENCOUNTER — Other Ambulatory Visit: Payer: Self-pay

## 2025-01-04 DIAGNOSIS — C50411 Malignant neoplasm of upper-outer quadrant of right female breast: Secondary | ICD-10-CM

## 2025-01-04 LAB — CBC
HCT: 36.9 % (ref 36.0–46.0)
Hemoglobin: 11.8 g/dL — ABNORMAL LOW (ref 12.0–15.0)
MCH: 27.4 pg (ref 26.0–34.0)
MCHC: 32 g/dL (ref 30.0–36.0)
MCV: 85.6 fL (ref 80.0–100.0)
Platelets: 286 10*3/uL (ref 150–400)
RBC: 4.31 MIL/uL (ref 3.87–5.11)
RDW: 12.6 % (ref 11.5–15.5)
WBC: 3.9 10*3/uL — ABNORMAL LOW (ref 4.0–10.5)
nRBC: 0 % (ref 0.0–0.2)

## 2025-01-04 LAB — PROTIME-INR
INR: 1.1 (ref 0.8–1.2)
Prothrombin Time: 14.4 s (ref 11.4–15.2)

## 2025-01-04 LAB — GLUCOSE, CAPILLARY
Glucose-Capillary: 75 mg/dL (ref 70–99)
Glucose-Capillary: 63 mg/dL — ABNORMAL LOW (ref 70–99)
Glucose-Capillary: 102 mg/dL — ABNORMAL HIGH (ref 70–99)
Glucose-Capillary: 89 mg/dL (ref 70–99)

## 2025-01-04 MED ORDER — SODIUM CHLORIDE 0.9 % IV SOLN: INTRAVENOUS | Status: DC

## 2025-01-04 MED ORDER — LIDOCAINE 1 % OPTIME INJ - NO CHARGE
10.0000 mL | Freq: Once | INTRAMUSCULAR | Status: AC
  Administered 2025-01-04: 10 mL
  Filled 2025-01-04: qty 10

## 2025-01-04 MED ORDER — DEXTROSE 50 % IV SOLN
INTRAVENOUS | Status: AC
  Filled 2025-01-04: qty 50

## 2025-01-04 MED ORDER — DEXTROSE 50 % IV SOLN
12.5000 g | INTRAVENOUS | Status: AC
  Administered 2025-01-04: 12.5 g via INTRAVENOUS

## 2025-01-04 MED ORDER — MIDAZOLAM HCL (PF) 2 MG/2ML IJ SOLN
INTRAMUSCULAR | Status: AC | PRN
  Administered 2025-01-04 (×3): .5 mg via INTRAVENOUS
  Administered 2025-01-04: 1 mg via INTRAVENOUS

## 2025-01-04 MED ORDER — MIDAZOLAM HCL 2 MG/2ML IJ SOLN
INTRAMUSCULAR | Status: AC
  Filled 2025-01-04: qty 4

## 2025-01-04 MED ORDER — FENTANYL CITRATE (PF) 100 MCG/2ML IJ SOLN
INTRAMUSCULAR | Status: AC | PRN
  Administered 2025-01-04 (×2): 25 ug via INTRAVENOUS
  Administered 2025-01-04: 50 ug via INTRAVENOUS
  Administered 2025-01-04 (×2): 25 ug via INTRAVENOUS

## 2025-01-04 MED ORDER — DEXTROSE 50 % IV SOLN: 25.0000 mL | Freq: Once | INTRAVENOUS | Status: DC

## 2025-01-04 MED ORDER — FENTANYL CITRATE (PF) 100 MCG/2ML IJ SOLN
INTRAMUSCULAR | Status: AC
  Filled 2025-01-04: qty 4

## 2025-01-04 NOTE — Procedures (Signed)
 Pre procedural Dx: FDG avid T11 lesion  Post procedural Dx: Same  Technically successful CT guided biopsy of T11 vertebral body lesion   EBL: None.   Complications: None immediate.   KANDICE Banner, MD Pager #: [phone]  T11 FDG avid vertebral lesion

## 2025-01-05 ENCOUNTER — Ambulatory Visit: Payer: Self-pay | Admitting: Surgery

## 2025-01-11 ENCOUNTER — Ambulatory Visit: Admitting: Podiatry

## 2025-01-11 ENCOUNTER — Inpatient Hospital Stay: Attending: Hematology and Oncology | Admitting: Hematology and Oncology

## 2025-01-18 ENCOUNTER — Encounter (HOSPITAL_COMMUNITY)

## 2025-03-16 ENCOUNTER — Encounter

## 2025-04-04 ENCOUNTER — Ambulatory Visit: Admitting: Podiatry

## 2025-06-15 ENCOUNTER — Encounter

## 2025-08-14 ENCOUNTER — Ambulatory Visit: Admitting: Hematology and Oncology

## 2025-08-16 ENCOUNTER — Inpatient Hospital Stay: Attending: Hematology and Oncology | Admitting: Hematology and Oncology

## 2025-09-14 ENCOUNTER — Encounter
